# Patient Record
Sex: Female | Born: 1955
Health system: Southern US, Community
[De-identification: ages and names within clinical notes are randomized; demographics above are authoritative.]

## PROBLEM LIST (undated history)

## (undated) DIAGNOSIS — F329 Major depressive disorder, single episode, unspecified: Secondary | ICD-10-CM

## (undated) DIAGNOSIS — J449 Chronic obstructive pulmonary disease, unspecified: Secondary | ICD-10-CM

## (undated) DIAGNOSIS — Z8719 Personal history of other diseases of the digestive system: Secondary | ICD-10-CM

## (undated) DIAGNOSIS — F319 Bipolar disorder, unspecified: Secondary | ICD-10-CM

## (undated) DIAGNOSIS — M1990 Unspecified osteoarthritis, unspecified site: Secondary | ICD-10-CM

## (undated) DIAGNOSIS — K219 Gastro-esophageal reflux disease without esophagitis: Secondary | ICD-10-CM

## (undated) DIAGNOSIS — Z87442 Personal history of urinary calculi: Secondary | ICD-10-CM

## (undated) DIAGNOSIS — S72009A Fracture of unspecified part of neck of unspecified femur, initial encounter for closed fracture: Secondary | ICD-10-CM

## (undated) DIAGNOSIS — R51 Headache: Secondary | ICD-10-CM

## (undated) DIAGNOSIS — F419 Anxiety disorder, unspecified: Secondary | ICD-10-CM

## (undated) DIAGNOSIS — F32A Depression, unspecified: Secondary | ICD-10-CM

## (undated) DIAGNOSIS — M542 Cervicalgia: Secondary | ICD-10-CM

## (undated) DIAGNOSIS — R748 Abnormal levels of other serum enzymes: Secondary | ICD-10-CM

## (undated) DIAGNOSIS — R251 Tremor, unspecified: Secondary | ICD-10-CM

## (undated) DIAGNOSIS — G35 Multiple sclerosis: Secondary | ICD-10-CM

## (undated) DIAGNOSIS — R519 Headache, unspecified: Secondary | ICD-10-CM

## (undated) DIAGNOSIS — N189 Chronic kidney disease, unspecified: Secondary | ICD-10-CM

## (undated) DIAGNOSIS — I10 Essential (primary) hypertension: Secondary | ICD-10-CM

## (undated) DIAGNOSIS — J342 Deviated nasal septum: Secondary | ICD-10-CM

## (undated) HISTORY — PX: TUBAL LIGATION: SHX77

## (undated) HISTORY — DX: Fracture of unspecified part of neck of unspecified femur, initial encounter for closed fracture: S72.009A

## (undated) HISTORY — PX: EYE SURGERY: SHX253

## (undated) HISTORY — PX: LIPOMA EXCISION: SHX5283

## (undated) HISTORY — DX: Abnormal levels of other serum enzymes: R74.8

---

## 1997-11-28 ENCOUNTER — Ambulatory Visit (HOSPITAL_COMMUNITY): Admission: RE | Admit: 1997-11-28 | Discharge: 1997-11-28 | Payer: Self-pay | Admitting: *Deleted

## 1997-12-02 ENCOUNTER — Ambulatory Visit (HOSPITAL_COMMUNITY): Admission: RE | Admit: 1997-12-02 | Discharge: 1997-12-02 | Payer: Self-pay | Admitting: *Deleted

## 1998-06-12 ENCOUNTER — Ambulatory Visit (HOSPITAL_COMMUNITY): Admission: RE | Admit: 1998-06-12 | Discharge: 1998-06-12 | Payer: Self-pay | Admitting: *Deleted

## 1998-06-12 ENCOUNTER — Encounter: Payer: Self-pay | Admitting: *Deleted

## 1998-09-10 ENCOUNTER — Emergency Department (HOSPITAL_COMMUNITY): Admission: EM | Admit: 1998-09-10 | Discharge: 1998-09-10 | Payer: Self-pay | Admitting: Emergency Medicine

## 1998-09-10 ENCOUNTER — Encounter: Payer: Self-pay | Admitting: Emergency Medicine

## 1998-09-25 ENCOUNTER — Other Ambulatory Visit: Admission: RE | Admit: 1998-09-25 | Discharge: 1998-09-25 | Payer: Self-pay | Admitting: *Deleted

## 1998-12-14 ENCOUNTER — Ambulatory Visit (HOSPITAL_COMMUNITY): Admission: RE | Admit: 1998-12-14 | Discharge: 1998-12-14 | Payer: Self-pay | Admitting: *Deleted

## 1999-11-04 ENCOUNTER — Other Ambulatory Visit: Admission: RE | Admit: 1999-11-04 | Discharge: 1999-11-04 | Payer: Self-pay | Admitting: *Deleted

## 2000-11-07 ENCOUNTER — Encounter: Payer: Self-pay | Admitting: *Deleted

## 2000-11-07 ENCOUNTER — Ambulatory Visit (HOSPITAL_COMMUNITY): Admission: RE | Admit: 2000-11-07 | Discharge: 2000-11-07 | Payer: Self-pay | Admitting: *Deleted

## 2001-05-28 ENCOUNTER — Encounter: Admission: RE | Admit: 2001-05-28 | Discharge: 2001-07-05 | Payer: Self-pay | Admitting: Psychiatry

## 2002-04-29 ENCOUNTER — Other Ambulatory Visit: Admission: RE | Admit: 2002-04-29 | Discharge: 2002-04-29 | Payer: Self-pay | Admitting: *Deleted

## 2004-03-10 ENCOUNTER — Other Ambulatory Visit: Admission: RE | Admit: 2004-03-10 | Discharge: 2004-03-10 | Payer: Self-pay | Admitting: Family Medicine

## 2004-06-23 ENCOUNTER — Encounter: Admission: RE | Admit: 2004-06-23 | Discharge: 2004-06-23 | Payer: Self-pay | Admitting: Family Medicine

## 2004-11-10 ENCOUNTER — Encounter: Admission: RE | Admit: 2004-11-10 | Discharge: 2004-11-10 | Payer: Self-pay | Admitting: Otolaryngology

## 2005-02-23 ENCOUNTER — Emergency Department (HOSPITAL_COMMUNITY): Admission: EM | Admit: 2005-02-23 | Discharge: 2005-02-23 | Payer: Self-pay | Admitting: Emergency Medicine

## 2005-03-21 ENCOUNTER — Other Ambulatory Visit: Admission: RE | Admit: 2005-03-21 | Discharge: 2005-03-21 | Payer: Self-pay | Admitting: Obstetrics and Gynecology

## 2005-10-05 ENCOUNTER — Ambulatory Visit: Payer: Self-pay | Admitting: Gastroenterology

## 2005-10-10 ENCOUNTER — Ambulatory Visit: Payer: Self-pay | Admitting: Gastroenterology

## 2005-10-31 ENCOUNTER — Ambulatory Visit: Payer: Self-pay | Admitting: Gastroenterology

## 2006-06-27 HISTORY — PX: COLONOSCOPY: SHX174

## 2006-07-11 ENCOUNTER — Ambulatory Visit (HOSPITAL_BASED_OUTPATIENT_CLINIC_OR_DEPARTMENT_OTHER): Admission: RE | Admit: 2006-07-11 | Discharge: 2006-07-11 | Payer: Self-pay | Admitting: Urology

## 2006-07-24 ENCOUNTER — Ambulatory Visit: Payer: Self-pay | Admitting: Gastroenterology

## 2006-07-24 LAB — CONVERTED CEMR LAB
ALT: 16 units/L (ref 0–40)
Albumin: 3.2 g/dL — ABNORMAL LOW (ref 3.5–5.2)
BUN: 10 mg/dL (ref 6–23)
Basophils Absolute: 0 10*3/uL (ref 0.0–0.1)
Chloride: 106 meq/L (ref 96–112)
Creatinine, Ser: 0.9 mg/dL (ref 0.4–1.2)
HCT: 35.5 % — ABNORMAL LOW (ref 36.0–46.0)
Hemoglobin: 12.4 g/dL (ref 12.0–15.0)
MCHC: 35 g/dL (ref 30.0–36.0)
MCV: 89.1 fL (ref 78.0–100.0)
Neutrophils Relative %: 64 % (ref 43.0–77.0)
Platelets: 170 10*3/uL (ref 150–400)
Potassium: 4 meq/L (ref 3.5–5.1)
Total Protein: 6.1 g/dL (ref 6.0–8.3)
WBC: 4.1 10*3/uL — ABNORMAL LOW (ref 4.5–10.5)

## 2006-08-04 ENCOUNTER — Encounter (INDEPENDENT_AMBULATORY_CARE_PROVIDER_SITE_OTHER): Payer: Self-pay | Admitting: Specialist

## 2006-08-04 ENCOUNTER — Ambulatory Visit (HOSPITAL_COMMUNITY): Admission: RE | Admit: 2006-08-04 | Discharge: 2006-08-04 | Payer: Self-pay | Admitting: Obstetrics & Gynecology

## 2006-08-08 ENCOUNTER — Ambulatory Visit: Payer: Self-pay | Admitting: Gastroenterology

## 2006-08-29 ENCOUNTER — Ambulatory Visit: Payer: Self-pay | Admitting: Gastroenterology

## 2006-08-30 ENCOUNTER — Ambulatory Visit (HOSPITAL_COMMUNITY): Admission: RE | Admit: 2006-08-30 | Discharge: 2006-08-30 | Payer: Self-pay | Admitting: Gastroenterology

## 2006-09-20 ENCOUNTER — Ambulatory Visit: Payer: Self-pay | Admitting: Gastroenterology

## 2006-10-09 ENCOUNTER — Ambulatory Visit: Payer: Self-pay | Admitting: Gastroenterology

## 2006-10-16 ENCOUNTER — Ambulatory Visit: Payer: Self-pay | Admitting: Gastroenterology

## 2006-10-20 ENCOUNTER — Encounter (INDEPENDENT_AMBULATORY_CARE_PROVIDER_SITE_OTHER): Payer: Self-pay | Admitting: *Deleted

## 2006-10-20 ENCOUNTER — Ambulatory Visit: Payer: Self-pay | Admitting: Gastroenterology

## 2006-11-14 ENCOUNTER — Ambulatory Visit: Payer: Self-pay | Admitting: Gastroenterology

## 2007-10-01 ENCOUNTER — Ambulatory Visit (HOSPITAL_COMMUNITY): Admission: RE | Admit: 2007-10-01 | Discharge: 2007-10-01 | Payer: Self-pay | Admitting: Family Medicine

## 2009-08-29 ENCOUNTER — Ambulatory Visit (HOSPITAL_COMMUNITY): Admission: RE | Admit: 2009-08-29 | Discharge: 2009-08-29 | Payer: Self-pay | Admitting: Family Medicine

## 2010-03-29 ENCOUNTER — Ambulatory Visit (HOSPITAL_COMMUNITY): Admission: RE | Admit: 2010-03-29 | Discharge: 2010-03-29 | Payer: Self-pay | Admitting: Psychiatry

## 2010-09-17 ENCOUNTER — Other Ambulatory Visit: Payer: Self-pay | Admitting: Dermatology

## 2010-11-09 NOTE — Assessment & Plan Note (Signed)
[facility]                         GASTROENTEROLOGY OFFICE NOTE   NAME:NEWMANAudryanna, Zurita                     MRN:          [id number]  DATE:11/14/2006                            DOB:          April 14, 1956    PROBLEM:  Diarrhea.   Ms. Fellows has returned for a scheduled GI followup.  She reports  improvement in her diarrhea, taking Imodium one tablet daily.  When she  took two tablets at a time, she developed abdominal bloating.  Colonoscopy on October 20, 2006 was entirely normal.  Random biopsies were  negative for microscopic colitis.  She claims her energy level has  improved.  She has occasional nausea.   PHYSICAL EXAMINATION:  VITAL SIGNS:  Pulse 62, blood pressure 110/70.  eight 140.   IMPRESSION:  Diarrhea.  I suspect this may be related to her multiple  sclerosis in view of the correlation between her diarrhea and worsening  of her multiple sclerosis.  It seems to be under good control with very  low-dose Imodium.   RECOMMENDATIONS:  Continue Imodium.  I recommended that she try taking  one tablet twice a day rather than two tablets at a time.     Barbette Hair. Arlyce Dice, MD,FACG  Electronically Signed    RDK/MedQ  DD: 11/14/2006  DT: 11/14/2006  Job #: [id number]   cc:   Alfredia Client, MD  Harriette Bouillon

## 2010-11-12 NOTE — Assessment & Plan Note (Signed)
[facility]                         GASTROENTEROLOGY OFFICE NOTE   NAME:NEWMANJaelene, Garciagarcia                     MRN:          [id number]  DATE:08/29/2006                            DOB:          11-May-1956    PROBLEM:  Diarrhea and abdominal pain.   Ms. Fiscus has returned for reevaluation.  Diarrhea continues unabated.  Sigmoidoscopy suggested some edema in the left colon but biopsies were  negative.  Stool studies were also unremarkable.  She was started on  Lialda but has not improved.  She also complains of moderately severe  upper epigastric pain that may radiate to her left chest and shoulder.  The pain may be separate from her chest discomfort.  She complains of  spontaneous left chest and shoulder pain that radiates to her neck that  also is present with exercise.  She claims to have lost weight and  altogether feels poorly.  She has been under a great deal of stress in  the last few weeks due to problems with her marriage.   PHYSICAL EXAMINATION:  VITAL SIGNS:  Pulse 72, blood pressure 130/90,  weight 138.  ABDOMEN:  She has mild midepigastric tenderness without guarding or  rebound.  There are no abdominal masses or organomegaly.   IMPRESSION:  Persistent diarrhea with new onset of severe upper  abdominal pain and chest pain.  This could be due to ulcer or non-ulcer  dyspepsia.  Her gastrointestinal symptoms may also be related to stress  and anxiety.  Chest pain may also be related to this, though a cardiac  etiology ought to be ruled out (family history is pertinent for mother  who developed heart problems in her late 45s).   RECOMMENDATIONS:  1. Upper endoscopy.  2. ECG.  3. If the two are not diagnostic, I will place her on antispasmodics      and consider empiric therapy with Xifaxan and follow this with a      cardiac evaluation.     Barbette Hair. Arlyce Dice, MD,FACG  Electronically Signed    RDK/MedQ  DD: 08/29/2006  DT: 08/29/2006   Job #: [id number]   cc:   Alfredia Client, MD  Harriette Bouillon

## 2010-11-12 NOTE — Assessment & Plan Note (Signed)
[facility]                         GASTROENTEROLOGY OFFICE NOTE   NEVAEHA, FINERTY                     MRN:          [id number]  DATE:10/09/2006                            DOB:          16-May-1956    PROBLEM:  Ms. Anfinson has returned for ongoing evaluation.  She continues  to have urgency and diarrhea. She may have urgency without a bowel  movement or pass minimal amounts of stool.  She is no better with  Flagyl.  She has taken Lialda, Flagyl, Levbid without improvement in her  diarrhea.  She noted abrupt onset of diarrhea in November which  initially was accompanied by nausea and vomiting. Prior to that she was  having solid bowel movements.  Stool studies have all been negative.  Her medicines have not changed.  Her worsening symptoms are coincident  with worsening of her multiple sclerosis.   On exam, pulse 68, blood pressure 122/80, weight 136.   IMPRESSION:  Persistent diarrhea with urgency.  Occult enteric infection  is less likely though still of consideration given her rather abrupt  onset.  Underlying inflammatory bowel disease, be it microscopic colitis  or gross colitis, remains a possibility as well.  Finally, her symptoms  are concurrent with the worsening of her multiple sclerosis, which at  least raises the question whether she could have motility disorder  related to her multiple sclerosis.   RECOMMENDATIONS:  Empiric trial of Cipro 500 mg twice a day.  If she is  not improved, then I will repeat her colonoscopy and we will obtain  random biopsies to rule out microscopic colitis, and if negative, I  would consider a small bowel study.     Barbette Hair. Arlyce Dice, MD,FACG  Electronically Signed    RDK/MedQ  DD: 10/09/2006  DT: 10/09/2006  Job #: [id number]   cc:   Alfredia Client, MD  Trudie Buckler

## 2010-11-12 NOTE — Assessment & Plan Note (Signed)
[facility]                         GASTROENTEROLOGY OFFICE NOTE   NAME:NEWMANGuyla, Bless                     MRN:          [id number]  DATE:07/24/2006                            DOB:          [birth year]    PROBLEM:  Diarrhea.   Ms. Kyer has returned complaining of diarrhea.   Since November she has noted a change in bowel habits consisting of  frequent bowel movements throughout the day.  She may have up to 10  bowel movements daily.  It is often post prandially, but also occurs in  the absence of eating.  She will awaken at night to defecate.  She  apparently took antibiotics, at least during the summer.  There has been  no other change in medicines.  Colonoscopy in April 2007 was  unremarkable.  There is no history of melena or hematochezia.  Abdominal  bloating has been well controlled with Gas-X.   MEDICATIONS:  Neurontin, baclofen, clonazepam, Prempro, Lamictal, and  Depakote.   EXAMINATION:  Pulse 65, blood pressure 110/70, weight 140.  ABDOMEN:  Without mass or tenderness or organomegaly.   IMPRESSION:  New onset of diarrhea which has become chronic.  Pseudomembranous colitis ought to be ruled out.  It is unlikely that  symptoms are due to her medicines, in as much as she has not had any  recent changes.  Enteric infection is another consideration.  Symptoms  appear rather severe for irritable bowel syndrome.   RECOMMENDATIONS:  1. Check stools for C. Diff, toxin, CNS, and leukocytes, and O&P.  2. Check CBC and CMET.  3. To consider sigmoidoscopy if stool studies are not diagnostic and      blood work is normal.     Barbette Hair. Arlyce Dice, MD,FACG  Electronically Signed    RDK/MedQ  DD: 07/24/2006  DT: 07/24/2006  Job #: [id number]   cc:   Alfredia Client, MD  Trudie Buckler

## 2010-11-12 NOTE — Op Note (Signed)
NAMESHANAIYA, BENE              ACCOUNT NO.:  [id number]   MEDICAL RECORD NO.:  [id number]          PATIENT TYPE:  AMB   LOCATION:  [facility]                         FACILITY:  [facility]   PHYSICIAN:  Martina Sinner, MD DATE OF BIRTH:  03/27/1956   DATE OF PROCEDURE:  DATE OF DISCHARGE:                               OPERATIVE REPORT   PREOPERATIVE DIAGNOSIS:  Pelvic pain.   POSTOPERATIVE DIAGNOSIS:  Pelvic pain; interstitial cystitis.   SURGERY:  Cystoscopy, bladder hydrodistention and bladder installation  therapy.   Ms. Radabaugh has chronic pelvic pain and also a lot of discomfort at night  relieved by voiding. She has mixed stress and urge incontinence.  She  has been thoroughly evaluated with urodynamics.   The patient was prepped and draped in the usual fashion.  On initial  inspection of the bladder, the bladder mucosa was normal.  There is no  stitch, foreign body or carcinoma.  I instilled and hydrodistended to  approximately 600 mL.  On re-examination of the bladder, she had diffuse  glomerulations but no ulcers. The bladder was emptied.  A small red  rubber catheter was inserted and I instilled 15 mL of 0.5% Marcaine in  combination with 400 mg Pyridium.   The patient was sent to the recovery room.  She was given preoperative  ciprofloxacin.   The patient will be treated as having interstitial cystitis.           ______________________________  Martina Sinner, MD  Electronically Signed     SAM/MEDQ  D:  07/11/2006  T:  07/11/2006  Job:  [id number]

## 2010-11-12 NOTE — Assessment & Plan Note (Signed)
[facility]                         GASTROENTEROLOGY OFFICE NOTE   Angel, Maddox                     MRN:          [id number]  DATE:09/20/2006                            DOB:          01-28-1956    [name] has returned for reevaluation. On a regimen of Levbid and  Xifaxan, there has been some improvement in her diarrhea. She still has  urgency and with urgency stools may be loose. Sigmoidoscopy raised a  question of erythema though biopsies were negative. She is still not  back to her baseline. She does complain of chest pain for which she  underwent upper endoscopy which was normal. She claims pain may be worse  when she swallows a large pill and begins in her lower chest or upper  abdomen and radiates to her mid chest. She also has chest pain at rest  under the left breast that radiates to her left neck and left arm.   FAMILY HISTORY:  Notable for mother who had a heart attack in her late  28s.   PHYSICAL EXAMINATION:  VITAL SIGNS:  Pulse 64, blood pressure 120/78,  weight 138.   IMPRESSION:  1. Diarrhea. This may be irritable bowel syndrome though with this      rather abrupt onset, infectious etiology remains a consideration      despite her negative stool studies including Clostridium difficile,      CNS, and ova and parasites.  2. Probable gastroesophageal reflux disease.  3. Chest pain. This may be related to her gastroesophageal reflux      disease. Given her family history of cardiac disease, I believe      that she ought to be reevaluated by cardiology.   RECOMMENDATIONS:  1. Empiric therapy with Flagyl 500 mg twice a day for 10 days and      AcipHex 20 mg a day.  2. Cardiac evaluation. I have asked her to see a cardiologist for      evaluation.   I have asked [name] to return in approximately two weeks.     Angel Maddox. Arlyce Dice, MD,FACG  Electronically Signed    RDK/MedQ  DD: 09/20/2006  DT: 09/20/2006  Job #:  [id number]   cc:   Alfredia Client, MD  Harriette Bouillon

## 2011-11-07 ENCOUNTER — Other Ambulatory Visit: Payer: Self-pay | Admitting: Dermatology

## 2012-01-19 ENCOUNTER — Other Ambulatory Visit: Payer: Self-pay | Admitting: Obstetrics and Gynecology

## 2012-05-09 ENCOUNTER — Other Ambulatory Visit: Payer: Self-pay | Admitting: Urology

## 2012-05-14 ENCOUNTER — Encounter (HOSPITAL_COMMUNITY): Payer: Self-pay | Admitting: Pharmacy Technician

## 2012-05-14 ENCOUNTER — Encounter (HOSPITAL_COMMUNITY): Payer: Self-pay | Admitting: *Deleted

## 2012-05-14 NOTE — Pre-Procedure Instructions (Addendum)
Asked to bring blue folder the day of the procedure,insurance card,I.D. driver's license,wear comfortable clothing and have a driver for the day. Asked not to take Advil,Motrin,Ibuprofen,Aleve or any NSAIDS, Aspirin, or Toradol for 72 hours prior to procedure,  No vitamins or herbal medications 7 days prior to procedure. Will stop Aspirin per policy. Instructed to take laxative per doctor's office instructions and eat a light dinner the evening before procedure.   To arrive at 0530  for lithotripsy procedure.

## 2012-05-17 NOTE — H&P (Signed)
History of Present Illness         Nephrolithiasis:  She underwent a CT scan in 2006 which revealed left renal calculi. A CT scan done on 04/23/12 for microscopic hematuria evaluation revealed a 6 mm stone located at the ureteropelvic junction region without obstruction. He had Hounsfield units of ~ 700. The radiologist felt there was some thickening of the mucosa and possible enhancement which would be consistent with irritation/inflammation from the stone. Her stone is easily visible on reformatted "KUB" portion of her CT scan.  Interval history: She had a stone several years ago that passed spontaneously. It was associated with some gross hematuria. She then had gross hematuria on 2 separate occasions that lasted about a week at a time. She been on antibiotics. Her hematuria has cleared. She's not having any flank pain at this time.   Past Medical History Problems  1. History of  Depression With Anxiety 300.4 2. Former Smoker V15.82 3. History of  Hypertension 401.9 4. History of  Irritable Bowel Syndrome 564.1 5. History of  Multiple Sclerosis 340 6. History of  Nephrolithiasis V13.01  Surgical History Problems  1. History of  Laparoscopy (Diagnostic) 2. History of  Tubal Ligation V25.2  Current Meds 1. Carisoprodol 350 MG Oral Tablet; Therapy: (Recorded:15Nov2007) to 2. Citalopram Hydrobromide 40 MG Oral Tablet; Therapy: 10Dec2012 to 3. ClonazePAM 0.5 MG Oral Tablet; Therapy: (Recorded:15Nov2007) to 4. Depakote 500 MG Oral Tablet Delayed Release; Therapy: (Recorded:15Nov2007) to 5. LamoTRIgine 200 MG Oral Tablet; Therapy: 18Sep2012 to 6. Neurontin 800 MG Oral Tablet; TAKE 1 TABLET 3 TIMES DAILY; Therapy: (Recorded:15Nov2007)  to 7. Nitrofurantoin Macrocrystal 100 MG Oral Capsule; TAKE 1 MG Twice daily; Therapy: 21Oct2013 to  (Last Rx:21Oct2013)  Requested for: 21Oct2013 8. Oxybutynin Chloride 5 MG Oral Tablet; Therapy: (Recorded:15Nov2007) to 9. Oxybutynin Chloride ER 10 MG Oral  Tablet Extended Release 24 Hour; TAKE 1 TABLET DAILY;  Therapy: 28Oct2013 to (Evaluate:23Oct2014)  Requested for: 28Oct2013; Last Rx:28Oct2013 10. Prempro 0.3-1.5 MG Oral Tablet; Therapy: (Recorded:24Apr2013) to 11. Primidone 50 MG Oral Tablet; Therapy: 25Oct2012 to 12. Rebif SOLN; Therapy: (Recorded:15Nov2007) to 13. RisperiDONE 1 MG Oral Tablet; Therapy: 26Jun2012 to 14. Tecfidera 240 MG Oral Capsule Delayed Release; Therapy: 12Jun2013 to 15. Zolpidem Tartrate 10 MG Oral Tablet; Therapy: (Recorded:24Apr2013) to  Allergies Medication  1. No Known Drug Allergies No Known Allergies  2. No Known Allergies  Family History Problems  1. Family history of  Cancer 2. Family history of  Heart Disease 3. Family history of  Hypertension 4. Family history of  Nephrolithiasis  Social History Problems  1. Alcohol Use only very occasional; may be every 4 or 5 months 2. Caffeine Use 3. Family history of  Death In The Family Father 64, alcohol 4. Family history of  Death In The Family Mother 81, heart 5. Marital History - Currently Married 6. Tobacco Use V15.82 Quit 1980; smoked may be 2 cigarettes per day for 13 years 7. Unemployed  Review of Systems: Pertinent items are noted in HPI. A comprehensive review of systems was negative except as above  Physical Exam: General appearance: alert and appears stated age Head: Normocephalic, without obvious abnormality, atraumatic Eyes: conjunctivae/corneas clear. EOM's intact.  Oropharynx: moist mucous membranes Neck: supple, symmetrical, trachea midline Resp: normal respiratory effort Cardio: regular rate and rhythm Back: symmetric, no curvature. ROM normal. No CVA tenderness. GI: soft, non-tender; bowel sounds normal; no masses,  no organomegaly Pelvic: deferred Extremities: extremities normal, atraumatic, no cyanosis or edema Skin: Skin color normal.  Assessment Assessed  1. Probable  Simple Renal Cyst In Both Kidneys 593.2 2.  Adrenal Cortical Adenoma Right 227.0 3. Proximal Ureteral Stone On The Left 592.1      We discussed the management of urinary stones. These options include observation, ureteroscopy, shockwave lithotripsy, and PCNL. We discussed which options are relevant to these particular stones. We discussed the natural history of stones as well as the complications of untreated stones and the impact on quality of life without treatment as well as with each of the above listed treatments. We also discussed the efficacy of each treatment in its ability to clear the stone burden. With any of these management options I discussed the signs and symptoms of infection and the need for emergent treatment should these be experienced. For each option we discussed the ability of each procedure to clear the patient of their stone burden.  For observation I described the risks which include but are not limited to silent renal damage, life-threatening infection, need for emergent surgery, failure to pass stone, and pain.  For ureteroscopy I described the risks which include heart attack, stroke, pulmonary embolus, death, bleeding, infection, damage to contiguous structures, positioning injury, ureteral stricture, ureteral avulsion, ureteral injury, need for ureteral stent, inability to perform ureteroscopy, need for an interval procedure, inability to clear stone burden, stent discomfort and pain.  For shockwave lithotripsy I described the risks which include arrhythmia, kidney contusion, kidney hemorrhage, need for transfusion, long-term risk of diabetes or hypertension, back discomfort, flank ecchymosis, flank abrasion, inability to break up stone, inability to pass stone fragments, Steinstrasse, infection associated with obstructing stones, need for different surgical procedure, need for repeat shockwave lithotripsy, and death.  After discussing each of these treatment options she has elected to proceed with lithotripsy of her  stone. I told her that once the stone was completely cleared I would repeat an imaging study of her upper tract to be sure that the inflammation/thickening near the stone was inflammatory only.   Plan    1. She will be scheduled for lithotripsy of her left proximal ureteral stone. 2. Once her stone has been shown to have been completely fragmented and passed she will need a repeat CT scan of the kidneys with contrast which can be performed about 6 weeks after her followup and clearance of stone.

## 2012-05-20 MED ORDER — CIPROFLOXACIN HCL 500 MG PO TABS
500.0000 mg | ORAL_TABLET | ORAL | Status: DC
  Filled 2012-05-20: qty 1

## 2012-05-21 ENCOUNTER — Encounter (HOSPITAL_COMMUNITY): Payer: Self-pay | Admitting: *Deleted

## 2012-05-21 ENCOUNTER — Ambulatory Visit (HOSPITAL_COMMUNITY)
Admission: RE | Admit: 2012-05-21 | Discharge: 2012-05-21 | Disposition: A | Discharge status: Home / Self Care | Payer: Medicare Other | Source: Ambulatory Visit | Attending: Urology | Admitting: Urology

## 2012-05-21 ENCOUNTER — Ambulatory Visit (HOSPITAL_COMMUNITY): Payer: Medicare Other

## 2012-05-21 ENCOUNTER — Encounter (HOSPITAL_COMMUNITY)
Admission: RE | Disposition: A | Discharge status: Home / Self Care | Payer: Self-pay | Source: Ambulatory Visit | Attending: Urology

## 2012-05-21 DIAGNOSIS — N281 Cyst of kidney, acquired: Secondary | ICD-10-CM | POA: Insufficient documentation

## 2012-05-21 DIAGNOSIS — G35 Multiple sclerosis: Secondary | ICD-10-CM | POA: Insufficient documentation

## 2012-05-21 DIAGNOSIS — N200 Calculus of kidney: Secondary | ICD-10-CM

## 2012-05-21 DIAGNOSIS — I10 Essential (primary) hypertension: Secondary | ICD-10-CM | POA: Insufficient documentation

## 2012-05-21 DIAGNOSIS — D3500 Benign neoplasm of unspecified adrenal gland: Secondary | ICD-10-CM | POA: Insufficient documentation

## 2012-05-21 DIAGNOSIS — N201 Calculus of ureter: Secondary | ICD-10-CM | POA: Insufficient documentation

## 2012-05-21 DIAGNOSIS — Z79899 Other long term (current) drug therapy: Secondary | ICD-10-CM | POA: Insufficient documentation

## 2012-05-21 HISTORY — DX: Multiple sclerosis: G35

## 2012-05-21 HISTORY — DX: Tremor, unspecified: R25.1

## 2012-05-21 HISTORY — DX: Anxiety disorder, unspecified: F41.9

## 2012-05-21 HISTORY — DX: Personal history of other diseases of the digestive system: Z87.19

## 2012-05-21 HISTORY — DX: Depression, unspecified: F32.A

## 2012-05-21 HISTORY — DX: Major depressive disorder, single episode, unspecified: F32.9

## 2012-05-21 HISTORY — DX: Bipolar disorder, unspecified: F31.9

## 2012-05-21 HISTORY — DX: Chronic kidney disease, unspecified: N18.9

## 2012-05-21 SURGERY — LITHOTRIPSY, ESWL
Anesthesia: LOCAL | Laterality: Left

## 2012-05-21 MED ORDER — TAMSULOSIN HCL 0.4 MG PO CAPS: 0.4000 mg | ORAL_CAPSULE | ORAL | Status: DC

## 2012-05-21 MED ORDER — DIAZEPAM 5 MG PO TABS
10.0000 mg | ORAL_TABLET | ORAL | Status: AC
  Administered 2012-05-21: 10 mg via ORAL
  Filled 2012-05-21: qty 2

## 2012-05-21 MED ORDER — CIPROFLOXACIN HCL 500 MG PO TABS
500.0000 mg | ORAL_TABLET | ORAL | Status: AC
  Administered 2012-05-21: 500 mg via ORAL
  Filled 2012-05-21: qty 1

## 2012-05-21 MED ORDER — SODIUM CHLORIDE 0.9 % IV SOLN
INTRAVENOUS | Status: DC
  Administered 2012-05-21: 07:00:00 via INTRAVENOUS

## 2012-05-21 MED ORDER — HYDROCODONE-ACETAMINOPHEN 10-325 MG PO TABS: 1.0000 | ORAL_TABLET | Freq: Four times a day (QID) | ORAL | Status: DC | PRN

## 2012-05-21 MED ORDER — DIPHENHYDRAMINE HCL 25 MG PO CAPS
25.0000 mg | ORAL_CAPSULE | ORAL | Status: AC
  Administered 2012-05-21: 25 mg via ORAL
  Filled 2012-05-21: qty 1

## 2012-05-21 NOTE — Op Note (Signed)
See [facility] OP note scanned into chart. 

## 2012-05-21 NOTE — Interval H&P Note (Signed)
History and Physical Interval Note:  05/21/2012 7:43 AM  Angel Maddox  has presented today for surgery, with the diagnosis of Left Ureteral Stone  The various methods of treatment have been discussed with the patient and family. After consideration of risks, benefits and other options for treatment, the patient has consented to  Procedure(s) (LRB) with comments: EXTRACORPOREAL SHOCK WAVE LITHOTRIPSY (ESWL) (Left) as a surgical intervention .  The patient's history has been reviewed, patient examined, no change in status, stable for surgery.  I have reviewed the patient's chart and labs.  Questions were answered to the patient's satisfaction.     [place] Farm

## 2012-05-21 NOTE — Progress Notes (Signed)
Post ESWL recover . Right flank grapefruit sized pink area with small abrasion in center tegaderm appled

## 2013-07-08 DIAGNOSIS — H5210 Myopia, unspecified eye: Secondary | ICD-10-CM | POA: Diagnosis not present

## 2013-07-08 DIAGNOSIS — H04129 Dry eye syndrome of unspecified lacrimal gland: Secondary | ICD-10-CM | POA: Diagnosis not present

## 2013-07-08 DIAGNOSIS — H52209 Unspecified astigmatism, unspecified eye: Secondary | ICD-10-CM | POA: Diagnosis not present

## 2013-07-08 DIAGNOSIS — H524 Presbyopia: Secondary | ICD-10-CM | POA: Diagnosis not present

## 2013-10-23 DIAGNOSIS — F3131 Bipolar disorder, current episode depressed, mild: Secondary | ICD-10-CM | POA: Diagnosis not present

## 2013-11-04 DIAGNOSIS — D7289 Other specified disorders of white blood cells: Secondary | ICD-10-CM | POA: Diagnosis not present

## 2013-11-04 DIAGNOSIS — G35 Multiple sclerosis: Secondary | ICD-10-CM | POA: Diagnosis not present

## 2014-02-15 ENCOUNTER — Encounter: Payer: Self-pay | Admitting: Gastroenterology

## 2014-04-11 ENCOUNTER — Ambulatory Visit: Payer: Self-pay | Admitting: Physician Assistant

## 2014-04-11 ENCOUNTER — Encounter (INDEPENDENT_AMBULATORY_CARE_PROVIDER_SITE_OTHER): Payer: Self-pay

## 2014-04-11 ENCOUNTER — Ambulatory Visit (INDEPENDENT_AMBULATORY_CARE_PROVIDER_SITE_OTHER): Payer: Medicare Other | Admitting: Family Medicine

## 2014-04-11 VITALS — BP 160/86 | HR 67 | Temp 97.4°F | Ht 66.5 in | Wt 138.4 lb

## 2014-04-11 DIAGNOSIS — H109 Unspecified conjunctivitis: Secondary | ICD-10-CM | POA: Diagnosis not present

## 2014-04-11 MED ORDER — POLYMYXIN B-TRIMETHOPRIM 10000-0.1 UNIT/ML-% OP SOLN: 2.0000 [drp] | OPHTHALMIC | Status: DC

## 2014-04-11 NOTE — Progress Notes (Signed)
   Subjective:    Patient ID: Angel Maddox, female    DOB: 26-Jun-1956, 58 y.o.   MRN: [id number]  HPI This 58 y.o. female presents for evaluation of red eye left eye and drainage for 2 days.   Review of Systems    No chest pain, SOB, HA, dizziness, vision change, N/V, diarrhea, constipation, dysuria, urinary urgency or frequency, myalgias, arthralgias or rash.  Objective:   Physical Exam Vital signs noted  Well developed well nourished female.  HEENT - Head atraumatic Normocephalic                Eyes - PERRLA, Conjuctiva - Injected left Sclera- Clear EOMI                Ears - EAC's Wnl TM's Wnl Gross Hearing WNL                Nose - Nares patent                 Throat - oropharanx wnl Respiratory - Lungs CTA bilateral Cardiac - RRR S1 and S2 without murmur GI - Abdomen soft Nontender and bowel sounds active x 4 Extremities - No edema. Neuro - Grossly intact.        Assessment & Plan:  Conjunctivitis of left eye - Plan: trimethoprim-polymyxin b (POLYTRIM) ophthalmic solution 2 gtt's OS q 2 hours w/a and then q 4 hours   Lysbeth Penner FNP

## 2014-05-12 DIAGNOSIS — Z23 Encounter for immunization: Secondary | ICD-10-CM | POA: Diagnosis not present

## 2014-05-12 DIAGNOSIS — G35 Multiple sclerosis: Secondary | ICD-10-CM | POA: Diagnosis not present

## 2014-07-02 DIAGNOSIS — F3131 Bipolar disorder, current episode depressed, mild: Secondary | ICD-10-CM | POA: Diagnosis not present

## 2014-10-10 DIAGNOSIS — F3131 Bipolar disorder, current episode depressed, mild: Secondary | ICD-10-CM | POA: Diagnosis not present

## 2014-11-04 DIAGNOSIS — F3131 Bipolar disorder, current episode depressed, mild: Secondary | ICD-10-CM | POA: Diagnosis not present

## 2014-11-10 DIAGNOSIS — D72819 Decreased white blood cell count, unspecified: Secondary | ICD-10-CM | POA: Diagnosis not present

## 2014-11-10 DIAGNOSIS — G35 Multiple sclerosis: Secondary | ICD-10-CM | POA: Diagnosis not present

## 2014-11-11 DIAGNOSIS — L570 Actinic keratosis: Secondary | ICD-10-CM | POA: Diagnosis not present

## 2014-11-11 DIAGNOSIS — L821 Other seborrheic keratosis: Secondary | ICD-10-CM | POA: Diagnosis not present

## 2014-11-11 DIAGNOSIS — D239 Other benign neoplasm of skin, unspecified: Secondary | ICD-10-CM | POA: Diagnosis not present

## 2014-11-18 DIAGNOSIS — F3131 Bipolar disorder, current episode depressed, mild: Secondary | ICD-10-CM | POA: Diagnosis not present

## 2014-12-03 DIAGNOSIS — F3131 Bipolar disorder, current episode depressed, mild: Secondary | ICD-10-CM | POA: Diagnosis not present

## 2014-12-31 DIAGNOSIS — F3131 Bipolar disorder, current episode depressed, mild: Secondary | ICD-10-CM | POA: Diagnosis not present

## 2015-01-20 DIAGNOSIS — F3131 Bipolar disorder, current episode depressed, mild: Secondary | ICD-10-CM | POA: Diagnosis not present

## 2015-02-03 DIAGNOSIS — F3131 Bipolar disorder, current episode depressed, mild: Secondary | ICD-10-CM | POA: Diagnosis not present

## 2015-02-11 ENCOUNTER — Ambulatory Visit (INDEPENDENT_AMBULATORY_CARE_PROVIDER_SITE_OTHER): Payer: Medicare Other | Admitting: Family Medicine

## 2015-02-11 ENCOUNTER — Encounter: Payer: Self-pay | Admitting: Family Medicine

## 2015-02-11 VITALS — BP 140/100 | HR 71 | Temp 98.0°F | Ht 66.5 in | Wt 144.0 lb

## 2015-02-11 DIAGNOSIS — F319 Bipolar disorder, unspecified: Secondary | ICD-10-CM | POA: Insufficient documentation

## 2015-02-11 MED ORDER — RISPERIDONE 1 MG PO TABS: 1.0000 mg | ORAL_TABLET | Freq: Every evening | ORAL | Status: DC

## 2015-02-11 NOTE — Progress Notes (Signed)
Patient ID: Angel Maddox, female   DOB: [birth year], 59 y.o.   MRN: [id number]   HPI  Patient presents today depression evaluation  Patient explains that her 20 year old son died by overdose about 6 months ago. Since that time she's been very depressed and upset. She says over the last 2 weeks she's been more depressed and she has been previously.  On review of her mental health disorder she has bipolar 1 disorder and has been seen by Dr. Candis Schatz at [facility] in [place]. He however has started his own practice and she has not been able to get an appointment with him. She's been treated with Celexa and Klonopin for about 9 years. She's been on Risperdal and Lamictal for maybe 3-4 years. About 3 weeks ago she quit both of those medications.  She denies suicidal ideation today. She states that she is very depressed  PMH: Smoking status noted ROS: Per HPI  Objective: BP 140/100 mmHg  Pulse 71  Temp(Src) 98 F (36.7 C) (Oral)  Ht 5' 6.5" (1.689 m)  Wt 144 lb (65.318 kg)  BMI 22.90 kg/m2 Gen: NAD, alert, cooperative with exam HEENT: NCAT CV: RRR, good S1/S2, no murmur Resp: CTABL, no wheezes, non-labored Ext: No edema, warm Neuro: Alert and oriented, No gross deficits Psych: Tearful, depressed affect and mood, denies suicidal ideation  Assessment and plan:  Bipolar 1 disorder, depressed Hx of bipolar depression treated with Lamictal, Risperdal, Celexa, and Klonopin Previously seen psychiatry, I stressed that she needs a psychiatrist, refer to psychiatry She's recently stopped Lamictal and Risperdal, restart Risperdal. I think she has safely stopped Lamictal being 3 weeks out. Continue Celexa and Klonopin as previously scheduled, follow-up 2 weeks No suicidal ideation, denies manic episodes in the past    Orders Placed This Encounter  Procedures  . Ambulatory referral to Psychiatry    Referral Priority:  Routine    Referral Type:  Psychiatric   Referral Reason:  Specialty Services Required    Requested Specialty:  Psychiatry    Number of Visits Requested:  1    Meds ordered this encounter  Medications  . risperiDONE (RISPERDAL) 1 MG tablet    Sig: Take 1 tablet (1 mg total) by mouth every evening.    Dispense:  30 tablet    Refill:  0    Laroy Apple, MD [facility] 02/11/2015, 1:40 PM

## 2015-02-11 NOTE — Patient Instructions (Signed)
It was very good to meet you today, I am so sorry for your loss.  Please restart risperdal and come back in 2 weeks.   We will begin looking for a psychiatrist with you.

## 2015-02-11 NOTE — Assessment & Plan Note (Addendum)
Hx of bipolar depression treated with Lamictal, Risperdal, Celexa, and Klonopin Previously seen psychiatry, I stressed that she needs a psychiatrist, refer to psychiatry She's recently stopped Lamictal and Risperdal, restart Risperdal. I think she has safely stopped Lamictal being 3 weeks out. Continue Celexa and Klonopin as previously scheduled, follow-up 2 weeks No suicidal ideation, denies manic episodes in the past

## 2015-02-17 DIAGNOSIS — F3131 Bipolar disorder, current episode depressed, mild: Secondary | ICD-10-CM | POA: Diagnosis not present

## 2015-02-26 ENCOUNTER — Ambulatory Visit: Payer: Medicare Other | Admitting: Family Medicine

## 2015-03-18 DIAGNOSIS — F3131 Bipolar disorder, current episode depressed, mild: Secondary | ICD-10-CM | POA: Diagnosis not present

## 2015-03-20 DIAGNOSIS — F329 Major depressive disorder, single episode, unspecified: Secondary | ICD-10-CM | POA: Diagnosis not present

## 2015-03-20 DIAGNOSIS — F411 Generalized anxiety disorder: Secondary | ICD-10-CM | POA: Diagnosis not present

## 2015-04-15 DIAGNOSIS — F411 Generalized anxiety disorder: Secondary | ICD-10-CM | POA: Diagnosis not present

## 2015-04-29 DIAGNOSIS — F411 Generalized anxiety disorder: Secondary | ICD-10-CM | POA: Diagnosis not present

## 2015-05-07 DIAGNOSIS — F411 Generalized anxiety disorder: Secondary | ICD-10-CM | POA: Diagnosis not present

## 2015-05-11 DIAGNOSIS — F411 Generalized anxiety disorder: Secondary | ICD-10-CM | POA: Diagnosis not present

## 2015-05-25 DIAGNOSIS — F411 Generalized anxiety disorder: Secondary | ICD-10-CM | POA: Diagnosis not present

## 2015-06-04 DIAGNOSIS — F411 Generalized anxiety disorder: Secondary | ICD-10-CM | POA: Diagnosis not present

## 2015-06-09 DIAGNOSIS — F411 Generalized anxiety disorder: Secondary | ICD-10-CM | POA: Diagnosis not present

## 2015-06-25 DIAGNOSIS — F411 Generalized anxiety disorder: Secondary | ICD-10-CM | POA: Diagnosis not present

## 2015-06-28 DIAGNOSIS — S72009A Fracture of unspecified part of neck of unspecified femur, initial encounter for closed fracture: Secondary | ICD-10-CM

## 2015-06-28 HISTORY — DX: Fracture of unspecified part of neck of unspecified femur, initial encounter for closed fracture: S72.009A

## 2015-07-01 DIAGNOSIS — G35 Multiple sclerosis: Secondary | ICD-10-CM | POA: Diagnosis not present

## 2015-07-01 DIAGNOSIS — F339 Major depressive disorder, recurrent, unspecified: Secondary | ICD-10-CM | POA: Diagnosis not present

## 2015-07-01 DIAGNOSIS — D72819 Decreased white blood cell count, unspecified: Secondary | ICD-10-CM | POA: Diagnosis not present

## 2015-07-20 ENCOUNTER — Other Ambulatory Visit: Payer: Medicare Other

## 2015-07-20 DIAGNOSIS — G35 Multiple sclerosis: Secondary | ICD-10-CM | POA: Diagnosis not present

## 2015-07-21 LAB — CMP14+EGFR
A/G RATIO: 1.8 (ref 1.1–2.5)
ALT: 44 IU/L — AB (ref 0–32)
AST: 30 IU/L (ref 0–40)
Albumin: 4.2 g/dL (ref 3.5–5.5)
Alkaline Phosphatase: 107 IU/L (ref 39–117)
BUN/Creatinine Ratio: 13 (ref 9–23)
BUN: 11 mg/dL (ref 6–24)
Bilirubin Total: 0.2 mg/dL (ref 0.0–1.2)
CALCIUM: 9.1 mg/dL (ref 8.7–10.2)
CO2: 22 mmol/L (ref 18–29)
Chloride: 103 mmol/L (ref 96–106)
Creatinine, Ser: 0.86 mg/dL (ref 0.57–1.00)
GFR, EST AFRICAN AMERICAN: 86 mL/min/{1.73_m2} (ref >59)
GFR, EST NON AFRICAN AMERICAN: 74 mL/min/{1.73_m2} (ref >59)
GLUCOSE: 150 mg/dL — AB (ref 65–99)
Globulin, Total: 2.4 g/dL (ref 1.5–4.5)
Potassium: 3.4 mmol/L — ABNORMAL LOW (ref 3.5–5.2)
Sodium: 142 mmol/L (ref 134–144)
TOTAL PROTEIN: 6.6 g/dL (ref 6.0–8.5)

## 2015-08-03 DIAGNOSIS — F411 Generalized anxiety disorder: Secondary | ICD-10-CM | POA: Diagnosis not present

## 2015-08-13 ENCOUNTER — Telehealth: Payer: Self-pay | Admitting: Family Medicine

## 2015-08-18 DIAGNOSIS — F411 Generalized anxiety disorder: Secondary | ICD-10-CM | POA: Diagnosis not present

## 2015-08-27 DIAGNOSIS — F411 Generalized anxiety disorder: Secondary | ICD-10-CM | POA: Diagnosis not present

## 2015-08-28 ENCOUNTER — Encounter: Payer: Self-pay | Admitting: Family Medicine

## 2015-08-28 ENCOUNTER — Ambulatory Visit (INDEPENDENT_AMBULATORY_CARE_PROVIDER_SITE_OTHER): Payer: Medicare Other | Admitting: Family Medicine

## 2015-08-28 VITALS — BP 133/87 | HR 71 | Temp 98.8°F | Ht 66.5 in | Wt 147.2 lb

## 2015-08-28 DIAGNOSIS — R739 Hyperglycemia, unspecified: Secondary | ICD-10-CM

## 2015-08-28 DIAGNOSIS — M25571 Pain in right ankle and joints of right foot: Secondary | ICD-10-CM

## 2015-08-28 DIAGNOSIS — R740 Nonspecific elevation of levels of transaminase and lactic acid dehydrogenase [LDH]: Secondary | ICD-10-CM | POA: Diagnosis not present

## 2015-08-28 DIAGNOSIS — R7401 Elevation of levels of liver transaminase levels: Secondary | ICD-10-CM

## 2015-08-28 DIAGNOSIS — R42 Dizziness and giddiness: Secondary | ICD-10-CM | POA: Diagnosis not present

## 2015-08-28 LAB — POCT GLYCOSYLATED HEMOGLOBIN (HGB A1C): Hemoglobin A1C: 5.2

## 2015-08-28 NOTE — Progress Notes (Signed)
   HPI  Patient presents today for lab follow-up.  She had labs performed by her neurologist in [facility]. They stated that her liver labs were often they needed to see a family doctor.  Her ALT is elevated at 44. She has no right upper quadrant pain. She is on several psychotropic medications including Risperdal, temazepam, Klonopin, primidone, Wellbutrin, Celexa, gabapentin.  R ankle pain After rolling her ankle 3 weeks ago No problem ebaring weight No seliing or redness Mild forefoot pain   PMH: Smoking status noted ROS: Per HPI  Objective: BP 133/87 mmHg  Pulse 71  Temp(Src) 98.8 F (37.1 C) (Oral)  Ht 5' 6.5" (1.689 m)  Wt 147 lb 3.2 oz (66.769 kg)  BMI 23.41 kg/m2 Gen: NAD, alert, cooperative with exam HEENT: NCAT, EOMI CV: RRR, good S1/S2, no murmur Resp: CTABL, no wheezes, non-labored Ext: No edema, warm Neuro: Alert and oriented, No gross deficits  Foot: No redness or swelling of the right ankle or foot Mild tenderness to palpation of the third and fourth metatarsals in the right foot, also tenderness to palpation of the tendons distal to the lateral malleolus on the right side No joint laxity Normal gait  Assessment and plan:  # dizziness I am suspicious of medication effect, no signs of vertigo or central lesion. She does have multiple sclerosis, however I do not believe this is causing it.  # Hyperglycemia Checking A1c, nonfasting labs  # Elevated liver function test Mild, repeat in 3 months  # forefoot pain, ankle pain After ankle injury, likely mild ankle sprain Ice, compression Return to clinic with any concerns     Angel Apple, MD [facility] 08/28/2015, 3:46 PM

## 2015-08-28 NOTE — Patient Instructions (Signed)
Great to see you!  Come back in 3 months to look at the labs again.

## 2015-09-02 DIAGNOSIS — F411 Generalized anxiety disorder: Secondary | ICD-10-CM | POA: Diagnosis not present

## 2015-09-04 ENCOUNTER — Encounter: Payer: Self-pay | Admitting: Gastroenterology

## 2015-09-22 DIAGNOSIS — F411 Generalized anxiety disorder: Secondary | ICD-10-CM | POA: Diagnosis not present

## 2015-09-24 DIAGNOSIS — F411 Generalized anxiety disorder: Secondary | ICD-10-CM | POA: Diagnosis not present

## 2015-10-13 DIAGNOSIS — F411 Generalized anxiety disorder: Secondary | ICD-10-CM | POA: Diagnosis not present

## 2015-11-02 ENCOUNTER — Encounter: Payer: Self-pay | Admitting: *Deleted

## 2015-11-08 ENCOUNTER — Encounter (HOSPITAL_COMMUNITY): Payer: Self-pay

## 2015-11-08 ENCOUNTER — Inpatient Hospital Stay (HOSPITAL_COMMUNITY)
Admission: EM | Admit: 2015-11-08 | Discharge: 2015-11-11 | DRG: 480 | Disposition: A | Discharge status: Skilled Nursing Facility | Payer: Medicare Other | Attending: Internal Medicine | Admitting: Internal Medicine

## 2015-11-08 ENCOUNTER — Emergency Department (HOSPITAL_COMMUNITY): Payer: Medicare Other

## 2015-11-08 DIAGNOSIS — W19XXXA Unspecified fall, initial encounter: Secondary | ICD-10-CM | POA: Diagnosis not present

## 2015-11-08 DIAGNOSIS — Z818 Family history of other mental and behavioral disorders: Secondary | ICD-10-CM | POA: Diagnosis not present

## 2015-11-08 DIAGNOSIS — G35 Multiple sclerosis: Secondary | ICD-10-CM | POA: Diagnosis present

## 2015-11-08 DIAGNOSIS — I10 Essential (primary) hypertension: Secondary | ICD-10-CM | POA: Diagnosis not present

## 2015-11-08 DIAGNOSIS — Y92009 Unspecified place in unspecified non-institutional (private) residence as the place of occurrence of the external cause: Secondary | ICD-10-CM

## 2015-11-08 DIAGNOSIS — N179 Acute kidney failure, unspecified: Secondary | ICD-10-CM

## 2015-11-08 DIAGNOSIS — M545 Low back pain: Secondary | ICD-10-CM | POA: Diagnosis not present

## 2015-11-08 DIAGNOSIS — Z87442 Personal history of urinary calculi: Secondary | ICD-10-CM

## 2015-11-08 DIAGNOSIS — R030 Elevated blood-pressure reading, without diagnosis of hypertension: Secondary | ICD-10-CM | POA: Diagnosis present

## 2015-11-08 DIAGNOSIS — R627 Adult failure to thrive: Secondary | ICD-10-CM | POA: Diagnosis present

## 2015-11-08 DIAGNOSIS — S72002D Fracture of unspecified part of neck of left femur, subsequent encounter for closed fracture with routine healing: Secondary | ICD-10-CM | POA: Diagnosis not present

## 2015-11-08 DIAGNOSIS — Y92099 Unspecified place in other non-institutional residence as the place of occurrence of the external cause: Secondary | ICD-10-CM | POA: Diagnosis not present

## 2015-11-08 DIAGNOSIS — E43 Unspecified severe protein-calorie malnutrition: Secondary | ICD-10-CM | POA: Diagnosis not present

## 2015-11-08 DIAGNOSIS — F418 Other specified anxiety disorders: Secondary | ICD-10-CM | POA: Diagnosis not present

## 2015-11-08 DIAGNOSIS — R0602 Shortness of breath: Secondary | ICD-10-CM | POA: Diagnosis not present

## 2015-11-08 DIAGNOSIS — N200 Calculus of kidney: Secondary | ICD-10-CM | POA: Diagnosis not present

## 2015-11-08 DIAGNOSIS — F339 Major depressive disorder, recurrent, unspecified: Secondary | ICD-10-CM

## 2015-11-08 DIAGNOSIS — M6281 Muscle weakness (generalized): Secondary | ICD-10-CM | POA: Diagnosis not present

## 2015-11-08 DIAGNOSIS — Z09 Encounter for follow-up examination after completed treatment for conditions other than malignant neoplasm: Secondary | ICD-10-CM

## 2015-11-08 DIAGNOSIS — F329 Major depressive disorder, single episode, unspecified: Secondary | ICD-10-CM | POA: Diagnosis not present

## 2015-11-08 DIAGNOSIS — Z79899 Other long term (current) drug therapy: Secondary | ICD-10-CM

## 2015-11-08 DIAGNOSIS — R41841 Cognitive communication deficit: Secondary | ICD-10-CM | POA: Diagnosis not present

## 2015-11-08 DIAGNOSIS — R05 Cough: Secondary | ICD-10-CM | POA: Diagnosis not present

## 2015-11-08 DIAGNOSIS — F419 Anxiety disorder, unspecified: Secondary | ICD-10-CM | POA: Diagnosis present

## 2015-11-08 DIAGNOSIS — E860 Dehydration: Secondary | ICD-10-CM | POA: Diagnosis present

## 2015-11-08 DIAGNOSIS — R278 Other lack of coordination: Secondary | ICD-10-CM | POA: Diagnosis not present

## 2015-11-08 DIAGNOSIS — S72092A Other fracture of head and neck of left femur, initial encounter for closed fracture: Secondary | ICD-10-CM | POA: Diagnosis present

## 2015-11-08 DIAGNOSIS — M25552 Pain in left hip: Secondary | ICD-10-CM | POA: Diagnosis not present

## 2015-11-08 DIAGNOSIS — F319 Bipolar disorder, unspecified: Secondary | ICD-10-CM

## 2015-11-08 DIAGNOSIS — S728X2A Other fracture of left femur, initial encounter for closed fracture: Secondary | ICD-10-CM | POA: Diagnosis not present

## 2015-11-08 DIAGNOSIS — Z6820 Body mass index (BMI) 20.0-20.9, adult: Secondary | ICD-10-CM

## 2015-11-08 DIAGNOSIS — N189 Chronic kidney disease, unspecified: Secondary | ICD-10-CM | POA: Diagnosis not present

## 2015-11-08 DIAGNOSIS — S72042A Displaced fracture of base of neck of left femur, initial encounter for closed fracture: Secondary | ICD-10-CM | POA: Diagnosis not present

## 2015-11-08 DIAGNOSIS — F3189 Other bipolar disorder: Secondary | ICD-10-CM | POA: Diagnosis not present

## 2015-11-08 DIAGNOSIS — S72002K Fracture of unspecified part of neck of left femur, subsequent encounter for closed fracture with nonunion: Secondary | ICD-10-CM | POA: Diagnosis not present

## 2015-11-08 DIAGNOSIS — G2581 Restless legs syndrome: Secondary | ICD-10-CM | POA: Diagnosis not present

## 2015-11-08 DIAGNOSIS — Z419 Encounter for procedure for purposes other than remedying health state, unspecified: Secondary | ICD-10-CM

## 2015-11-08 DIAGNOSIS — G250 Essential tremor: Secondary | ICD-10-CM | POA: Diagnosis not present

## 2015-11-08 DIAGNOSIS — S72002A Fracture of unspecified part of neck of left femur, initial encounter for closed fracture: Secondary | ICD-10-CM | POA: Diagnosis present

## 2015-11-08 DIAGNOSIS — W19XXXD Unspecified fall, subsequent encounter: Secondary | ICD-10-CM | POA: Diagnosis not present

## 2015-11-08 DIAGNOSIS — G894 Chronic pain syndrome: Secondary | ICD-10-CM | POA: Diagnosis not present

## 2015-11-08 DIAGNOSIS — R2689 Other abnormalities of gait and mobility: Secondary | ICD-10-CM | POA: Diagnosis not present

## 2015-11-08 DIAGNOSIS — R251 Tremor, unspecified: Secondary | ICD-10-CM | POA: Diagnosis present

## 2015-11-08 LAB — PROTIME-INR
INR: 1.19 (ref 0.00–1.49)
PROTHROMBIN TIME: 15.2 s (ref 11.6–15.2)

## 2015-11-08 LAB — COMPREHENSIVE METABOLIC PANEL
ALT: 41 U/L (ref 14–54)
AST: 103 U/L — ABNORMAL HIGH (ref 15–41)
Albumin: 5.2 g/dL — ABNORMAL HIGH (ref 3.5–5.0)
Alkaline Phosphatase: 107 U/L (ref 38–126)
Anion gap: 19 — ABNORMAL HIGH (ref 5–15)
BILIRUBIN TOTAL: 1.5 mg/dL — AB (ref 0.3–1.2)
BUN: 23 mg/dL — AB (ref 6–20)
CALCIUM: 10 mg/dL (ref 8.9–10.3)
CO2: 19 mmol/L — AB (ref 22–32)
Chloride: 102 mmol/L (ref 101–111)
Creatinine, Ser: 1.26 mg/dL — ABNORMAL HIGH (ref 0.44–1.00)
GFR, EST AFRICAN AMERICAN: 53 mL/min — AB (ref >60)
GFR, EST NON AFRICAN AMERICAN: 46 mL/min — AB (ref >60)
GLUCOSE: 120 mg/dL — AB (ref 65–99)
Potassium: 3.9 mmol/L (ref 3.5–5.1)
Sodium: 140 mmol/L (ref 135–145)
Total Protein: 8.7 g/dL — ABNORMAL HIGH (ref 6.5–8.1)

## 2015-11-08 LAB — CBC
HEMATOCRIT: 42.8 % (ref 36.0–46.0)
HEMOGLOBIN: 14.9 g/dL (ref 12.0–15.0)
MCH: 31.8 pg (ref 26.0–34.0)
MCHC: 34.8 g/dL (ref 30.0–36.0)
MCV: 91.5 fL (ref 78.0–100.0)
Platelets: 208 10*3/uL (ref 150–400)
RBC: 4.68 MIL/uL (ref 3.87–5.11)
RDW: 12.5 % (ref 11.5–15.5)
WBC: 11.2 10*3/uL — ABNORMAL HIGH (ref 4.0–10.5)

## 2015-11-08 LAB — SURGICAL PCR SCREEN
MRSA, PCR: NEGATIVE
STAPHYLOCOCCUS AUREUS: NEGATIVE

## 2015-11-08 LAB — ACETAMINOPHEN LEVEL

## 2015-11-08 LAB — SALICYLATE LEVEL: Salicylate Lvl: <4 mg/dL (ref 2.8–30.0)

## 2015-11-08 LAB — ETHANOL: Alcohol, Ethyl (B): <5 mg/dL (ref <5)

## 2015-11-08 MED ORDER — SODIUM CHLORIDE 0.9 % IV BOLUS (SEPSIS)
1000.0000 mL | Freq: Once | INTRAVENOUS | Status: AC
  Administered 2015-11-08: 1000 mL via INTRAVENOUS

## 2015-11-08 MED ORDER — CHLORHEXIDINE GLUCONATE 4 % EX LIQD: 60.0000 mL | Freq: Once | CUTANEOUS | Status: DC

## 2015-11-08 MED ORDER — POVIDONE-IODINE 10 % EX SWAB: 2.0000 "application " | Freq: Once | CUTANEOUS | Status: DC

## 2015-11-08 MED ORDER — ACETAMINOPHEN 650 MG RE SUPP: 650.0000 mg | Freq: Four times a day (QID) | RECTAL | Status: DC | PRN

## 2015-11-08 MED ORDER — CLONAZEPAM 0.5 MG PO TABS
0.5000 mg | ORAL_TABLET | Freq: Three times a day (TID) | ORAL | Status: DC | PRN
  Administered 2015-11-08 – 2015-11-10 (×4): 0.5 mg via ORAL
  Filled 2015-11-08 (×5): qty 1

## 2015-11-08 MED ORDER — ONDANSETRON HCL 4 MG/2ML IJ SOLN: 4.0000 mg | Freq: Four times a day (QID) | INTRAMUSCULAR | Status: DC | PRN

## 2015-11-08 MED ORDER — ONDANSETRON HCL 4 MG PO TABS: 4.0000 mg | ORAL_TABLET | Freq: Four times a day (QID) | ORAL | Status: DC | PRN

## 2015-11-08 MED ORDER — ENSURE ENLIVE PO LIQD
237.0000 mL | Freq: Two times a day (BID) | ORAL | Status: DC
  Administered 2015-11-10 – 2015-11-11 (×3): 237 mL via ORAL

## 2015-11-08 MED ORDER — CEFAZOLIN SODIUM-DEXTROSE 2-4 GM/100ML-% IV SOLN
2.0000 g | INTRAVENOUS | Status: AC
  Administered 2015-11-09: 2 g via INTRAVENOUS

## 2015-11-08 MED ORDER — RISPERIDONE 1 MG PO TABS
1.0000 mg | ORAL_TABLET | Freq: Every evening | ORAL | Status: DC
  Administered 2015-11-08 – 2015-11-10 (×2): 1 mg via ORAL
  Filled 2015-11-08 (×4): qty 1

## 2015-11-08 MED ORDER — MORPHINE SULFATE (PF) 2 MG/ML IV SOLN
2.0000 mg | INTRAVENOUS | Status: DC | PRN
  Administered 2015-11-08 – 2015-11-09 (×5): 2 mg via INTRAVENOUS
  Filled 2015-11-08 (×5): qty 1

## 2015-11-08 MED ORDER — BUPROPION HCL ER (XL) 300 MG PO TB24
300.0000 mg | ORAL_TABLET | Freq: Every day | ORAL | Status: DC
  Administered 2015-11-08 – 2015-11-11 (×4): 300 mg via ORAL
  Filled 2015-11-08 (×5): qty 1

## 2015-11-08 MED ORDER — LORAZEPAM 2 MG/ML IJ SOLN
1.0000 mg | Freq: Once | INTRAMUSCULAR | Status: AC
  Administered 2015-11-08: 1 mg via INTRAVENOUS
  Filled 2015-11-08: qty 1

## 2015-11-08 MED ORDER — ACETAMINOPHEN 325 MG PO TABS
650.0000 mg | ORAL_TABLET | Freq: Four times a day (QID) | ORAL | Status: DC | PRN
  Administered 2015-11-08: 650 mg via ORAL
  Filled 2015-11-08: qty 2

## 2015-11-08 MED ORDER — GABAPENTIN 400 MG PO CAPS
400.0000 mg | ORAL_CAPSULE | Freq: Three times a day (TID) | ORAL | Status: DC
  Administered 2015-11-08 – 2015-11-11 (×8): 400 mg via ORAL
  Filled 2015-11-08 (×8): qty 1

## 2015-11-08 MED ORDER — CITALOPRAM HYDROBROMIDE 20 MG PO TABS
40.0000 mg | ORAL_TABLET | Freq: Every day | ORAL | Status: DC
  Administered 2015-11-09 – 2015-11-11 (×3): 40 mg via ORAL
  Filled 2015-11-08: qty 2
  Filled 2015-11-08: qty 1
  Filled 2015-11-08: qty 2
  Filled 2015-11-08: qty 1

## 2015-11-08 MED ORDER — SODIUM CHLORIDE 0.9 % IV SOLN
INTRAVENOUS | Status: DC
  Administered 2015-11-09 (×2): via INTRAVENOUS
  Administered 2015-11-09 (×2): 1000 mL via INTRAVENOUS

## 2015-11-08 MED ORDER — OXYCODONE HCL 5 MG PO TABS
5.0000 mg | ORAL_TABLET | ORAL | Status: DC | PRN
  Administered 2015-11-08 – 2015-11-11 (×5): 5 mg via ORAL
  Filled 2015-11-08 (×5): qty 1

## 2015-11-08 NOTE — H&P (Signed)
History and Physical    Angel Maddox [id number] DOB: [birth year] DOA: 11/08/2015  PCP: Redge Gainer, MD  Patient coming from: Home  Chief Complaint: Status post fall  HPI: Angel Maddox is a 60 y.o. female with medical history significant of major depression and bipolar disorder who presents to the emergency room with complaints of left hip pain. Family members reporting that she has had severe depression since the passing away of her son over a year ago. Husband states that she has spent the majority of her time in bed with little mobility, having decreased by mouth intake, "sleeping all day long." She has experienced severe depressive symptoms, tearfulness, having feelings of hopelessness. Last Friday she got up to use the restroom having a fall in the hallway. Her husband helped her get back into bed and had not been out of bed since then, which according to her husband was not far from baseline. They decided to come into the emergency room today. Angel. Droke does not participate much in her history, majority of history provided by her husband was at bedside. She is tearful.  ED Course: Workup in the emergency department included hip x-ray did not reveal an acute transcervical mildly impacted angulated left femoral neck fracture. Emergency room provider discussed case with Dr. Rolena Infante of orthopedic surgery requesting medicine to admit with orthopedic consultation.  Review of Systems: As per HPI otherwise 10 point review of systems negative.    Past Medical History  Diagnosis Date  . Anxiety   . Chronic kidney disease     kidney stone  . H/O hiatal hernia   . Multiple sclerosis ([place])      Had for 15 years  . Tremors of nervous system   . Depression   . Bipolar 1 disorder [facility])     Past Surgical History  Procedure Laterality Date  . Tubal ligation       reports that she has never smoked. She has never used smokeless tobacco. She reports that she drinks alcohol. She  reports that she does not use illicit drugs.  No Known Allergies  Family History reviewed non pertinent  Prior to Admission medications   Medication Sig Start Date End Date Taking? Authorizing Provider  buPROPion (WELLBUTRIN XL) 300 MG 24 hr tablet Take 300 mg by mouth daily.   Yes Historical Provider, MD  citalopram (CELEXA) 40 MG tablet Take 40 mg by mouth daily with breakfast.   Yes Historical Provider, MD  clonazePAM (KLONOPIN) 0.5 MG tablet Take 0.5-1 mg by mouth 3 (three) times daily as needed for anxiety. Anxiety   Yes Historical Provider, MD  gabapentin (NEURONTIN) 800 MG tablet Take 800 mg by mouth 4 (four) times daily.    Yes Historical Provider, MD  loratadine-pseudoephedrine ([employer] ALLERGY RELIEF-D) 10-240 MG 24 hr tablet Take 1 tablet by mouth daily.   Yes Historical Provider, MD  Multiple Vitamin (MULTIVITAMIN WITH MINERALS) TABS Take 1 tablet by mouth daily.   Yes Historical Provider, MD  risperiDONE (RISPERDAL) 1 MG tablet Take 1 tablet (1 mg total) by mouth every evening. 02/11/15  Yes Timmothy Euler, MD  primidone (MYSOLINE) 50 MG tablet Take 250 mg by mouth 4 (four) times daily.     Historical Provider, MD    Physical Exam: Filed Vitals:   11/08/15 0822 11/08/15 1039  BP: 155/115 164/122  Pulse: 110 100  Temp: 98.4 F (36.9 C) 98.1 F (36.7 C)  TempSrc: Oral Oral  Resp: 24 25  SpO2: 100%  94%    Constitutional: She is tearful, mild distress, anxious appearing. Filed Vitals:   11/08/15 0822 11/08/15 1039  BP: 155/115 164/122  Pulse: 110 100  Temp: 98.4 F (36.9 C) 98.1 F (36.7 C)  TempSrc: Oral Oral  Resp: 24 25  SpO2: 100% 94%   Eyes: PERRL, lids and conjunctivae normal ENMT: Mucous membranes are moist. Posterior pharynx clear of any exudate or lesions.Normal dentition.  Neck: normal, supple, no masses, no thyromegaly Respiratory: clear to auscultation bilaterally, no wheezing, no crackles. Normal respiratory effort. No accessory muscle use.    Cardiovascular: Regular rate and rhythm, no murmurs / rubs / gallops. No extremity edema. 2+ pedal pulses. No carotid bruits.  Abdomen: no tenderness, no masses palpated. No hepatosplenomegaly. Bowel sounds positive.  Musculoskeletal: There is pain with passive and active movement of her left lower extremity Skin: no rashes, lesions, ulcers. No induration Neurologic: CN 2-12 grossly intact. Sensation intact, DTR normal. Strength 5/5 in all 4.  Psychiatric: She is in mild distress, tearful throughout my evaluation, does not contribute very much to history.  Labs on Admission: I have personally reviewed following labs and imaging studies  CBC:  Recent Labs Lab 11/08/15 0839  WBC 11.2*  HGB 14.9  HCT 42.8  MCV 91.5  PLT 123XX123   Basic Metabolic Panel:  Recent Labs Lab 11/08/15 0839  NA 140  K 3.9  CL 102  CO2 19*  GLUCOSE 120*  BUN 23*  CREATININE 1.26*  CALCIUM 10.0   GFR: CrCl cannot be calculated (Unknown ideal weight.). Liver Function Tests:  Recent Labs Lab 11/08/15 0839  AST 103*  ALT 41  ALKPHOS 107  BILITOT 1.5*  PROT 8.7*  ALBUMIN 5.2*   No results for input(s): LIPASE, AMYLASE in the last 168 hours. No results for input(s): AMMONIA in the last 168 hours. Coagulation Profile: No results for input(s): INR, PROTIME in the last 168 hours. Cardiac Enzymes: No results for input(s): CKTOTAL, CKMB, CKMBINDEX, TROPONINI in the last 168 hours. BNP (last 3 results) No results for input(s): PROBNP in the last 8760 hours. HbA1C: No results for input(s): HGBA1C in the last 72 hours. CBG: No results for input(s): GLUCAP in the last 168 hours. Lipid Profile: No results for input(s): CHOL, HDL, LDLCALC, TRIG, CHOLHDL, LDLDIRECT in the last 72 hours. Thyroid Function Tests: No results for input(s): TSH, T4TOTAL, FREET4, T3FREE, THYROIDAB in the last 72 hours. Anemia Panel: No results for input(s): VITAMINB12, FOLATE, FERRITIN, TIBC, IRON, RETICCTPCT in the last 72  hours. Urine analysis: No results found for: COLORURINE, APPEARANCEUR, LABSPEC, PHURINE, GLUCOSEU, HGBUR, BILIRUBINUR, KETONESUR, PROTEINUR, UROBILINOGEN, NITRITE, LEUKOCYTESUR Sepsis Labs: !!!!!!!!!!!!!!!!!!!!!!!!!!!!!!!!!!!!!!!!!!!! @LABRCNTIP (procalcitonin:4,lacticidven:4) )No results found for this or any previous visit (from the past 240 hour(s)).   Radiological Exams on Admission: Dg Chest 2 View  11/08/2015  CLINICAL DATA:  60 year old female with 2-3 day history of cough. History of fall yesterday, currently unable to walk. Pain in the left hip. Shortness of breath. EXAM: CHEST  2 VIEW COMPARISON:  No priors. FINDINGS: Lung volumes are normal. No consolidative airspace disease. No pleural effusions. No pneumothorax. No pulmonary nodule or mass noted. Pulmonary vasculature and the cardiomediastinal silhouette are within normal limits. IMPRESSION: No radiographic evidence of acute cardiopulmonary disease. Electronically Signed   By: Vinnie Langton M.D.   On: 11/08/2015 10:24   Dg Hip Unilat With Pelvis 2-3 Views Left  11/08/2015  CLINICAL DATA:  60 year old female with history of trauma from a fall yesterday complaining of pain in the  left hip today, unable to walk. EXAM: DG HIP (WITH OR WITHOUT PELVIS) 2-3V LEFT COMPARISON:  No priors. FINDINGS: There is an acute transcervical left-sided femoral neck fracture with mild valgus angulation and mild impaction. Femoral head remains located in the left acetabulum. Bony pelvis appears intact. Right proximal femur as visualized is intact. Mild moderate degenerative changes of osteoarthritis are noted in the hip joints bilaterally. IMPRESSION: 1. Acute transcervical mildly impacted mildly angulated left femoral neck fracture, as above. 2. Mild to moderate osteoarthritis in the hip joints bilaterally. Electronically Signed   By: Vinnie Langton M.D.   On: 11/08/2015 10:29    EKG: Independently reviewed.   Assessment/Plan Principal Problem:    Closed left hip fracture [facility]) Active Problems:   Fall at home   Femoral neck fracture, left, closed, initial encounter  1.  Left femoral neck fracture. Angel Maddox having a fall at home last Friday. [place] reports that she has been minimally active over the past months spending the majority of her day sleeping. Likely severely deconditioned and possibly malnourished, having a fall on her way to the bathroom. Imaging studies today showing the presence of an acute left femoral neck fracture. Dr. Rolena Infante of orthopedic surgery was consulted by emergency room provider. Will keep her NPO, placed on IV fluids, SCD's, await further recommendations from orthopedic surgery.  2.  Major depression/history of bipolar disorder. She lost her son over year ago. Her husband states that she has had severe depression, affecting her ability to function normally. She spends the majority the day sleeping in bed. Psychiatry has been consulted. Will continue her home regimen with citalopram, Risperdal and bupropion until evaluated by psychiatry.  3.  Acute kidney injury. Likely secondary to dehydration from minimal by mouth intake, lab showing creatinine of 1.26, provide IV fluid resuscitation. Repeat labs in a.m.  4.  History of multiple sclerosis. Currently stable    DVT prophylaxis: SCD's Code Status: Full code Family Communication: Spoke to her husband is present at bedside Disposition Plan: Plan to admit to the inpatient service anticipate will require greater than 2 nights hospitalization Consults called: Emergency room provider calling Dr. Rolena Infante of orthopedic surgery Admission status: MedSurg   Kelvin Cellar MD [facility] Pager [phone]  If 7PM-7AM, please contact night-coverage [email or web address] Password [facility]  11/08/2015, 12:59 PM

## 2015-11-08 NOTE — ED Notes (Signed)
Patient unable to provide urine sample at this time

## 2015-11-08 NOTE — ED Notes (Signed)
Patient attempted to use the bedpan and was unsuccessful.

## 2015-11-08 NOTE — ED Notes (Signed)
Patient's husband states he brought the patient in because she "sleeps all the time, confused, and has a bad cough." patient is very anxious and tearful. When asked a question, the patient grunts and does not answer. Patient is very fidgety and talks to husband when she does talk. Patient denies alcohol or drug use. Patient denies SI/Hi, visual or auditory hallucinations.

## 2015-11-08 NOTE — Consult Note (Signed)
Patient ID: Angel Maddox MRN: [id number] DOB/AGE: 1955-09-24 60 y.o.  Admit date: 11/08/2015  Admission Diagnoses:  Principal Problem:   Closed left hip fracture [facility]) Active Problems:   Fall at home   Femoral neck fracture, left, closed, initial encounter   HPI: Pleasant 60 year old female pt with a past medical hx significant for MS and bipolar.  She fell at her home on Friday evening.  She used a walker on Saturday around the house.  Her husband said this morning it was obvious the pt was not improving and he brought her to the ED.    Past Medical History: Past Medical History  Diagnosis Date  . Anxiety   . Chronic kidney disease     kidney stone  . H/O hiatal hernia   . Multiple sclerosis ([place])      Had for 15 years  . Tremors of nervous system   . Depression   . Bipolar 1 disorder [facility])     Surgical History: Past Surgical History  Procedure Laterality Date  . Tubal ligation      Family History: History reviewed. No pertinent family history.  Social History: Social History   Social History  . Marital Status: Married    Spouse Name: N/A  . Number of Children: N/A  . Years of Education: N/A   Occupational History  . Not on file.   Social History Main Topics  . Smoking status: Never Smoker   . Smokeless tobacco: Never Used  . Alcohol Use: Yes     Comment: socially  . Drug Use: No  . Sexual Activity: Not on file   Other Topics Concern  . Not on file   Social History Narrative    Allergies: Review of patient's allergies indicates no known allergies.  Medications: I have reviewed the patient's current medications.  Vital Signs: Patient Vitals for the past 24 hrs:  BP Temp Temp src Pulse Resp SpO2 Height Weight  11/08/15 1421 (!) 170/90 mmHg 97.8 F (36.6 C) Oral 93 (!) 24 99 % - -  11/08/15 1415 (!) 148/110 mmHg - - 93 (!) 22 - - -  11/08/15 1340 (!) 168/118 mmHg 98.9 F (37.2 C) Oral (!) 109 (!) 24 98 % 5\' 10"  (1.778 m) 63.504 kg  (140 lb)  11/08/15 1200 104/60 mmHg - - - 23 - - -  11/08/15 1039 (!) 164/122 mmHg 98.1 F (36.7 C) Oral 100 25 94 % - -  11/08/15 0822 (!) 155/115 mmHg 98.4 F (36.9 C) Oral 110 24 100 % - -    Radiology: Dg Chest 2 View  11/08/2015  CLINICAL DATA:  60 year old female with 2-3 day history of cough. History of fall yesterday, currently unable to walk. Pain in the left hip. Shortness of breath. EXAM: CHEST  2 VIEW COMPARISON:  No priors. FINDINGS: Lung volumes are normal. No consolidative airspace disease. No pleural effusions. No pneumothorax. No pulmonary nodule or mass noted. Pulmonary vasculature and the cardiomediastinal silhouette are within normal limits. IMPRESSION: No radiographic evidence of acute cardiopulmonary disease. Electronically Signed   By: Vinnie Langton M.D.   On: 11/08/2015 10:24   Dg Hip Unilat With Pelvis 2-3 Views Left  11/08/2015  CLINICAL DATA:  60 year old female with history of trauma from a fall yesterday complaining of pain in the left hip today, unable to walk. EXAM: DG HIP (WITH OR WITHOUT PELVIS) 2-3V LEFT COMPARISON:  No priors. FINDINGS: There is an acute transcervical left-sided femoral neck  fracture with mild valgus angulation and mild impaction. Femoral head remains located in the left acetabulum. Bony pelvis appears intact. Right proximal femur as visualized is intact. Mild moderate degenerative changes of osteoarthritis are noted in the hip joints bilaterally. IMPRESSION: 1. Acute transcervical mildly impacted mildly angulated left femoral neck fracture, as above. 2. Mild to moderate osteoarthritis in the hip joints bilaterally. Electronically Signed   By: Vinnie Langton M.D.   On: 11/08/2015 10:29    Labs:  Recent Labs  11/08/15 0839  WBC 11.2*  RBC 4.68  HCT 42.8  PLT 208    Recent Labs  11/08/15 0839  NA 140  K 3.9  CL 102  CO2 19*  BUN 23*  CREATININE 1.26*  GLUCOSE 120*  CALCIUM 10.0   No results for input(s): LABPT, INR in the  last 72 hours.  Review of Systems: ROS  Physical Exam: Neurologically intact ABD soft Sensation intact distally  Severe pain of the left hip  Assessment and Plan: NPO after midnight Dr. Rolena Infante will consult Dr. Lyla Glassing - consider surgical intervention tomorrow Consulted pharmacy - we are restarting her bipolar medications Talked to the nurse - we will alternate the IV Morphine and PO Oxy every 2 hours  Pt may have her regularly prescribed Klonopin tid prn   [facility] [phone]

## 2015-11-08 NOTE — Progress Notes (Signed)
Permission was granted by Angel Maddox and her spouse to discuss her plan of care with her daughter, last name Odell.  Will add her to contact list.  She wanted to report that the last time her mom (the Angel Maddox) acted "loopy" her ammonia levels were high. She wants [place] to check them.  She also said another family member who saw her in the hospital thought she was possible having seizures due to Angel Maddox's behavior. She reported Angel Maddox was blinking her eyes oddly and smacking her mouth.  Daughter would like Angel Maddox to have Angel Maddox seen by a neurologist while she is here.  Angel Maddox coughing frequently, she may need to start her allergy med of  Claritin D.

## 2015-11-08 NOTE — ED Notes (Signed)
Dr. Delo at bedside. 

## 2015-11-08 NOTE — ED Notes (Signed)
Assisted patient to the bathroom to obtain urine sample, patient unable to follow simple commands. Her urine amount was very little and un measurable, along with a very small bowel movement. Patient also placed toilet paper in the sample after being told not to. Therefor the urine was contaminated and not able to be sent for screening. Plan to collect urine when possible.

## 2015-11-08 NOTE — ED Provider Notes (Signed)
CSN: [id number]     Arrival date & time 11/08/15  0813 History   First MD Initiated Contact with Patient 11/08/15 [phone]     Chief Complaint  Patient presents with  . Depression  . Anxiety  . Altered Mental Status     (Consider location/radiation/quality/duration/timing/severity/associated sxs/prior Treatment) HPI Comments: Patient is a 60 year old female with history of bipolar disorder, anxiety, and depression. She presents for evaluation of cough, anxiety, and confusion which has been worsening for the past several months. The patient adds little to the history other than stating "I want my son back" and "it hurts" referring to her hip. Her husband states that she did fall several days ago.  She has been very depressed since her son passed away slightly over one year ago.  The history is provided by the patient.    Past Medical History  Diagnosis Date  . Anxiety   . Chronic kidney disease     kidney stone  . H/O hiatal hernia   . Multiple sclerosis ([place])      Had for 15 years  . Tremors of nervous system   . Depression   . Bipolar 1 disorder [facility])    Past Surgical History  Procedure Laterality Date  . Tubal ligation     History reviewed. No pertinent family history. Social History  Substance Use Topics  . Smoking status: Never Smoker   . Smokeless tobacco: Never Used  . Alcohol Use: Yes     Comment: socially   OB History    No data available     Review of Systems  All other systems reviewed and are negative.     Allergies  Review of patient's allergies indicates no known allergies.  Home Medications   Prior to Admission medications   Medication Sig Start Date End Date Taking? Authorizing Provider  buPROPion (WELLBUTRIN XL) 300 MG 24 hr tablet Take 300 mg by mouth daily.    Historical Provider, MD  citalopram (CELEXA) 40 MG tablet Take 40 mg by mouth daily with breakfast.    Historical Provider, MD  clonazePAM (KLONOPIN) 0.5 MG tablet Take 0.5 mg by mouth  2 (two) times daily as needed. Anxiety    Historical Provider, MD  gabapentin (NEURONTIN) 800 MG tablet Take 400-800 mg by mouth 3 (three) times daily. She takes 1 tablet in the morning 1/2 at lunch and 1 at bedtime    Historical Provider, MD  methylphenidate [facility]) 10 mg/9hr patch Place 10 mg onto the skin daily. wear patch for 9 hours only each day    Historical Provider, MD  Multiple Vitamin (MULTIVITAMIN WITH MINERALS) TABS Take 1 tablet by mouth daily.    Historical Provider, MD  primidone (MYSOLINE) 50 MG tablet Take 250 mg by mouth 4 (four) times daily.     Historical Provider, MD  risperiDONE (RISPERDAL) 1 MG tablet Take 1 tablet (1 mg total) by mouth every evening. Patient taking differently: Take 4 mg by mouth every evening.  02/11/15   Timmothy Euler, MD  temazepam (RESTORIL) 15 MG capsule Take 15 mg by mouth at bedtime.    Historical Provider, MD   BP 155/115 mmHg  Pulse 110  Temp(Src) 98.4 F (36.9 C) (Oral)  Resp 24  SpO2 100% Physical Exam  Constitutional: She is oriented to person, place, and time. She appears well-developed and well-nourished.  Patient appears extremely anxious, fidgety, restless, and is hyperventilating.  HENT:  Head: Normocephalic and atraumatic.  Eyes: EOM are normal. Pupils are  equal, round, and reactive to light.  Neck: Normal range of motion. Neck supple.  Cardiovascular: Normal rate and regular rhythm.  Exam reveals no gallop and no friction rub.   No murmur heard. Pulmonary/Chest: Effort normal and breath sounds normal. No respiratory distress. She has no wheezes.  Abdominal: Soft. Bowel sounds are normal. She exhibits no distension. There is no tenderness.  Musculoskeletal: Normal range of motion. She exhibits no edema.  Neurological: She is alert and oriented to person, place, and time. No cranial nerve deficit. She exhibits normal muscle tone. Coordination normal.  Neurologic exam is somewhat limited secondary to her anxious state,  however she appears intact.  Skin: Skin is warm.  Psychiatric: Her mood appears anxious. She is hyperactive. She expresses no homicidal and no suicidal ideation. She expresses no suicidal plans and no homicidal plans. She is noncommunicative.  Nursing note and vitals reviewed.   ED Course  Procedures (including critical care time) Labs Review Labs Reviewed  CBC - Abnormal; Notable for the following:    WBC 11.2 (*)    All other components within normal limits  COMPREHENSIVE METABOLIC PANEL  ETHANOL  SALICYLATE LEVEL  ACETAMINOPHEN LEVEL  URINE RAPID DRUG SCREEN, HOSP PERFORMED  URINALYSIS, ROUTINE W REFLEX MICROSCOPIC (NOT AT [facility])    Imaging Review No results found. I have personally reviewed and evaluated these images and lab results as part of my medical decision-making.   EKG Interpretation None      MDM   Final diagnoses:  None    X-rays reveal a left femoral neck fracture which is nondisplaced. I've discussed this finding with Dr. Rolena Infante from orthopedics who will address the hip fracture, but would like the patient admitted to the medicine service. I've spoken with Dr. Coralyn Pear who agrees to admit.  The remainder of the medical workup is essentially unremarkable. There may be some mild dehydration and she was given normal saline for this. She appears to have psychiatric issues and she is extremely anxious. She will likely require consultation with TTS while she is an inpatient.    Veryl Speak, MD 11/08/15 [phone]

## 2015-11-08 NOTE — BH Assessment (Signed)
Assessment Note  Angel Maddox is an 60 y.o. female. Patient was brought to the ED by Angel Maddox because of increased sleeping, confused, anxious, and tearful.  Patient is unable to answer questions appropriately.  She presents with aphasic like symptoms such as struggling to communicate, only blurting out one word.  Patient 's Angel Maddox was at bedside and able to provide history.  Angel Maddox reports the current symptoms has been present for the patient year and three months since the death of her son.  He reports over the past couple weeks symptoms has gotten worse such as sleep all day, not eating, not caring for hygiene, not drinking water, but will take medications.  On Friday of this week, past fell and it was determined today there is possible damage to her hip.     Patient is currently participating with [facility] and last seen a month ago but has a upcoming appointment next week.    This Probation officer consulted with Dr. Darleene Cleaver, it is recommended to refer for inpatient hospitalization once patient is medical cleared.    Diagnosis: Bipolar 1  Past Medical History:  Past Medical History  Diagnosis Date  . Anxiety   . Chronic kidney disease     kidney stone  . H/O hiatal hernia   . Multiple sclerosis ([place])      Had for 15 years  . Tremors of nervous system   . Depression   . Bipolar 1 disorder [facility])     Past Surgical History  Procedure Laterality Date  . Tubal ligation      Family History: History reviewed. No pertinent family history.  Social History:  reports that she has never smoked. She has never used smokeless tobacco. She reports that she drinks alcohol. She reports that she does not use illicit drugs.  Additional Social History:  Alcohol / Drug Use Pain Medications: see chart Prescriptions: see chart Over the Counter: see chart History of alcohol / drug use?: No history of alcohol / drug abuse  CIWA: CIWA-Ar BP: (!) 164/122 mmHg Pulse Rate: 100 COWS:     Allergies: No Known Allergies  Home Medications:  (Not in a hospital admission)  OB/GYN Status:  No LMP recorded. Patient is postmenopausal.  General Assessment Data Location of Assessment: [facility] TTS Assessment: In system Is this a Tele or Face-to-Face Assessment?: Face-to-Face Is this an Initial Assessment or a Re-assessment for this encounter?: Initial Assessment Marital status: Married [facility] name: unable to assess Is patient pregnant?: No Pregnancy Status: No Living Arrangements: Spouse/significant other Can pt return to current living arrangement?: Yes Admission Status: Voluntary Is patient capable of signing voluntary admission?: Yes Referral Source: Self/Family/Friend Insurance type: [facility]     Crisis Care Plan Living Arrangements: Spouse/significant other Name of Psychiatrist: [place] Name of Therapist: [facility]  Education Status Is patient currently in school?: No Highest grade of school patient has completed: unable to assess  Risk to self with the past 6 months Suicidal Ideation:  (unable to assess`) Has patient been a risk to self within the past 6 months prior to admission? : No (unable to assess) Suicidal Intent: No (unable to assess) Has patient had any suicidal intent within the past 6 months prior to admission? :  (unable to assess) Is patient at risk for suicide?:  (unable to assess) Suicidal Plan?:  (unable to assess) Has patient had any suicidal plan within the past 6 months prior to admission? :  (unable to assess) Access to Means:  (unable to assess)  What has been your use of drugs/alcohol within the last 12 months?:  (unable to assess) Previous Attempts/Gestures:  (unable to assess) How many times?:  (unable to assess) Other Self Harm Risks:  (unable to assess) Triggers for Past Attempts:  (unable to assess) Intentional Self Injurious Behavior:  (unable to assess) Family Suicide History:  (unable to assess) Recent stressful life event(s):   (unable to assess) Persecutory voices/beliefs?:  (unable to assess) Depression:  (unable to assess) Depression Symptoms:  (unable to assess) Substance abuse history and/or treatment for substance abuse?:  (unable to assess) Suicide prevention information given to non-admitted patients:  (unable to assess)  Risk to Others within the past 6 months Homicidal Ideation:  (unable to assess) Does patient have any lifetime risk of violence toward others beyond the six months prior to admission? :  (unable to assess) Thoughts of Harm to Others:  (unable to assess) Current Homicidal Intent:  (unable to assess) Current Homicidal Plan:  (unable to assess) Access to Homicidal Means:  (unable to assess) Identified Victim:  (unable to assess) History of harm to others?:  (unable to assess) Assessment of Violence:  (unable to assess) Violent Behavior Description:  (unable to assess) Does patient have access to weapons?:  (unable to assess) Criminal Charges Pending?:  (unable to assess) Does patient have a court date:  (unable to assess) Is patient on probation?:  (unable to assess)  Psychosis Hallucinations:  (unable to assess) Delusions:  (unable to assess)  Mental Status Report Appearance/Hygiene: In hospital gown (unable to assess) Eye Contact: Unable to Assess (unable to assess) Motor Activity: Psychomotor retardation, Rigidity, Shuffling (unable to assess) Speech: Aphasic Level of Consciousness: Crying, Restless, Unresponsive (To pain or command) Mood: Anxious Affect: Constricted, Frightened Anxiety Level: Severe Thought Processes: Thought Blocking Judgement: Impaired Orientation: Not oriented Obsessive Compulsive Thoughts/Behaviors: Unable to Assess  Cognitive Functioning Concentration: Unable to Assess Memory: Unable to Assess IQ:  (unable to assess) Insight: Unable to Assess Impulse Control: Unable to Assess Appetite: Poor (Angel Maddox reports not eating at all since a week  ago) Weight Loss:  (unable to assess) Weight Gain:  (unable to assess) Sleep: Increased (Angel Maddox reports sleeping 24 hours daily in last couple weeks) Total Hours of Sleep: 24 Vegetative Symptoms: Staying in bed, Not bathing, Decreased grooming (Per Angel Maddox pt not caring for self)  ADLScreening [facility] Assessment Services) Patient's cognitive ability adequate to safely complete daily activities?:  (unable to assess) Patient able to express need for assistance with ADLs?: No Independently performs ADLs?: No  Prior Inpatient Therapy Prior Inpatient Therapy:  (unable to assess)  Prior Outpatient Therapy Prior Outpatient Therapy: Yes (unable to assess) Prior Therapy Dates: currently Prior Therapy Facilty/Provider(s): [facility] Reason for Treatment: Bipolar Does patient have an ACCT team?: Unknown Does patient have Intensive In-House Services?  : Unknown Does patient have [facility] services? : Unknown Does patient have P4CC services?: Unknown  ADL Screening (condition at time of admission) Patient's cognitive ability adequate to safely complete daily activities?:  (unable to assess) Patient able to express need for assistance with ADLs?: No Independently performs ADLs?: No       Abuse/Neglect Assessment (Assessment to be complete while patient is alone) Physical Abuse:  (unable to assess) Verbal Abuse:  (unable to assess) Sexual Abuse:  (unable to assess) Exploitation of patient/patient's resources:  (unable to assess) Self-Neglect: Yes, present (Comment) (Angel Maddox reports pt is sleeping 24 hours, not eating or drinking ) Values / Beliefs Cultural Requests During Hospitalization:  (unable to assess) Spiritual Requests  During Hospitalization:  (unable to assess) Consults Spiritual Care Consult Needed:  (unable to assess) Social Work Consult Needed:  (unable to assess) Regulatory affairs officer (For Healthcare) Does patient have an advance directive?: No Would patient like information on  creating an advanced directive?: No - patient declined information    Additional Information 1:1 In Past 12 Months?:  (unable to assess) CIRT Risk:  (unable to assess) Elopement Risk:  (unable to assess) Does patient have medical clearance?: No     Disposition:  Disposition Initial Assessment Completed for this Encounter: Yes Disposition of Patient: Inpatient treatment program Type of inpatient treatment program: Adult  On Site Evaluation by:   Reviewed with Physician:    Chesley Noon A 11/08/2015 11:43 AM

## 2015-11-08 NOTE — Progress Notes (Signed)
Chaplain visited in response to a [place] placed in [name] chart at [place] on 14 May. [name] was in therapy. Please page a chaplain when it is more convenient to visit. [place] remains active.  Angel Maddox. Angel Maddox, [place]

## 2015-11-09 ENCOUNTER — Inpatient Hospital Stay (HOSPITAL_COMMUNITY): Payer: Medicare Other

## 2015-11-09 ENCOUNTER — Inpatient Hospital Stay (HOSPITAL_COMMUNITY): Payer: Medicare Other | Admitting: Anesthesiology

## 2015-11-09 ENCOUNTER — Encounter (HOSPITAL_COMMUNITY)
Admission: EM | Disposition: A | Discharge status: Skilled Nursing Facility | Payer: Self-pay | Source: Home / Self Care | Attending: Internal Medicine

## 2015-11-09 ENCOUNTER — Encounter (HOSPITAL_COMMUNITY): Payer: Self-pay | Admitting: Certified Registered"

## 2015-11-09 DIAGNOSIS — E43 Unspecified severe protein-calorie malnutrition: Secondary | ICD-10-CM | POA: Insufficient documentation

## 2015-11-09 DIAGNOSIS — S72002A Fracture of unspecified part of neck of left femur, initial encounter for closed fracture: Secondary | ICD-10-CM | POA: Diagnosis present

## 2015-11-09 DIAGNOSIS — W19XXXD Unspecified fall, subsequent encounter: Secondary | ICD-10-CM

## 2015-11-09 DIAGNOSIS — F319 Bipolar disorder, unspecified: Secondary | ICD-10-CM

## 2015-11-09 DIAGNOSIS — N179 Acute kidney failure, unspecified: Secondary | ICD-10-CM

## 2015-11-09 DIAGNOSIS — S72002K Fracture of unspecified part of neck of left femur, subsequent encounter for closed fracture with nonunion: Secondary | ICD-10-CM

## 2015-11-09 HISTORY — PX: HIP PINNING,CANNULATED: SHX1758

## 2015-11-09 LAB — BASIC METABOLIC PANEL
Anion gap: 14 (ref 5–15)
BUN: 13 mg/dL (ref 6–20)
CALCIUM: 8.9 mg/dL (ref 8.9–10.3)
CO2: 18 mmol/L — ABNORMAL LOW (ref 22–32)
CREATININE: 0.79 mg/dL (ref 0.44–1.00)
Chloride: 107 mmol/L (ref 101–111)
GFR calc Af Amer: >60 mL/min (ref >60)
Glucose, Bld: 71 mg/dL (ref 65–99)
POTASSIUM: 3.5 mmol/L (ref 3.5–5.1)
SODIUM: 139 mmol/L (ref 135–145)

## 2015-11-09 LAB — CBC
HEMATOCRIT: 37.4 % (ref 36.0–46.0)
Hemoglobin: 12.6 g/dL (ref 12.0–15.0)
MCH: 31.2 pg (ref 26.0–34.0)
MCHC: 33.7 g/dL (ref 30.0–36.0)
MCV: 92.6 fL (ref 78.0–100.0)
PLATELETS: 164 10*3/uL (ref 150–400)
RBC: 4.04 MIL/uL (ref 3.87–5.11)
RDW: 12.7 % (ref 11.5–15.5)
WBC: 8.2 10*3/uL (ref 4.0–10.5)

## 2015-11-09 LAB — GLUCOSE, CAPILLARY: Glucose-Capillary: 78 mg/dL (ref 65–99)

## 2015-11-09 LAB — TYPE AND SCREEN
ABO/RH(D): A POS
Antibody Screen: NEGATIVE

## 2015-11-09 LAB — TSH: TSH: 2.486 u[IU]/mL (ref 0.350–4.500)

## 2015-11-09 LAB — ABO/RH: ABO/RH(D): A POS

## 2015-11-09 LAB — MRSA PCR SCREENING: MRSA BY PCR: NEGATIVE

## 2015-11-09 LAB — VITAMIN B12: Vitamin B-12: 574 pg/mL (ref 180–914)

## 2015-11-09 SURGERY — FIXATION, FEMUR, NECK, PERCUTANEOUS, USING SCREW
Anesthesia: General | Site: Hip | Laterality: Left

## 2015-11-09 MED ORDER — PROPOFOL 10 MG/ML IV BOLUS
INTRAVENOUS | Status: DC | PRN
  Administered 2015-11-09: 150 mg via INTRAVENOUS

## 2015-11-09 MED ORDER — MENTHOL 3 MG MT LOZG: 1.0000 | LOZENGE | OROMUCOSAL | Status: DC | PRN

## 2015-11-09 MED ORDER — ROCURONIUM BROMIDE 100 MG/10ML IV SOLN
INTRAVENOUS | Status: DC | PRN
  Administered 2015-11-09: 30 mg via INTRAVENOUS

## 2015-11-09 MED ORDER — EPHEDRINE SULFATE-NACL 50-0.9 MG/10ML-% IV SOSY
PREFILLED_SYRINGE | INTRAVENOUS | Status: DC | PRN
  Administered 2015-11-09: 10 mg via INTRAVENOUS

## 2015-11-09 MED ORDER — HALOPERIDOL LACTATE 5 MG/ML IJ SOLN
INTRAMUSCULAR | Status: AC
  Filled 2015-11-09: qty 1

## 2015-11-09 MED ORDER — ONDANSETRON HCL 4 MG PO TABS: 4.0000 mg | ORAL_TABLET | Freq: Four times a day (QID) | ORAL | Status: DC | PRN

## 2015-11-09 MED ORDER — METOCLOPRAMIDE HCL 5 MG/ML IJ SOLN
5.0000 mg | Freq: Three times a day (TID) | INTRAMUSCULAR | Status: DC | PRN
  Administered 2015-11-10: 10 mg via INTRAVENOUS
  Filled 2015-11-09: qty 2

## 2015-11-09 MED ORDER — FENTANYL CITRATE (PF) 100 MCG/2ML IJ SOLN
INTRAMUSCULAR | Status: AC
  Filled 2015-11-09: qty 4

## 2015-11-09 MED ORDER — METHOCARBAMOL 1000 MG/10ML IJ SOLN
500.0000 mg | Freq: Four times a day (QID) | INTRAMUSCULAR | Status: DC | PRN
  Administered 2015-11-09: 500 mg via INTRAVENOUS
  Filled 2015-11-09 (×2): qty 5

## 2015-11-09 MED ORDER — GUAIFENESIN-DM 100-10 MG/5ML PO SYRP
5.0000 mL | ORAL_SOLUTION | ORAL | Status: DC | PRN
  Administered 2015-11-09: 5 mL via ORAL
  Filled 2015-11-09: qty 10

## 2015-11-09 MED ORDER — EPHEDRINE SULFATE 50 MG/ML IJ SOLN
INTRAMUSCULAR | Status: AC
  Filled 2015-11-09: qty 1

## 2015-11-09 MED ORDER — LABETALOL HCL 5 MG/ML IV SOLN
5.0000 mg | Freq: Once | INTRAVENOUS | Status: AC
  Administered 2015-11-09: 5 mg via INTRAVENOUS
  Filled 2015-11-09: qty 4

## 2015-11-09 MED ORDER — PHENOL 1.4 % MT LIQD: 1.0000 | OROMUCOSAL | Status: DC | PRN

## 2015-11-09 MED ORDER — HALOPERIDOL LACTATE 5 MG/ML IJ SOLN
2.0000 mg | Freq: Once | INTRAMUSCULAR | Status: AC
  Administered 2015-11-09: 2 mg via INTRAVENOUS

## 2015-11-09 MED ORDER — LIDOCAINE HCL (PF) 2 % IJ SOLN
INTRAMUSCULAR | Status: DC | PRN
Start: 2015-11-09 — End: 2015-11-09
  Administered 2015-11-09: 30 mg via INTRADERMAL

## 2015-11-09 MED ORDER — METHOCARBAMOL 500 MG PO TABS
500.0000 mg | ORAL_TABLET | Freq: Four times a day (QID) | ORAL | Status: DC | PRN
  Administered 2015-11-10 – 2015-11-11 (×2): 500 mg via ORAL
  Filled 2015-11-09 (×2): qty 1

## 2015-11-09 MED ORDER — SENNA 8.6 MG PO TABS
1.0000 | ORAL_TABLET | Freq: Two times a day (BID) | ORAL | Status: DC
Start: 2015-11-09 — End: 2015-11-11
  Administered 2015-11-09: 8.6 mg via ORAL
  Filled 2015-11-09 (×4): qty 1

## 2015-11-09 MED ORDER — SODIUM CHLORIDE 0.9 % IJ SOLN
INTRAMUSCULAR | Status: AC
  Filled 2015-11-09: qty 10

## 2015-11-09 MED ORDER — ONDANSETRON HCL 4 MG/2ML IJ SOLN
INTRAMUSCULAR | Status: DC | PRN
  Administered 2015-11-09: 4 mg via INTRAVENOUS

## 2015-11-09 MED ORDER — ACETAMINOPHEN 650 MG RE SUPP: 650.0000 mg | Freq: Four times a day (QID) | RECTAL | Status: DC | PRN

## 2015-11-09 MED ORDER — MAGNESIUM CITRATE PO SOLN: 1.0000 | Freq: Once | ORAL | Status: DC | PRN

## 2015-11-09 MED ORDER — ACETAMINOPHEN 325 MG PO TABS
650.0000 mg | ORAL_TABLET | Freq: Four times a day (QID) | ORAL | Status: DC | PRN
  Administered 2015-11-10: 650 mg via ORAL
  Filled 2015-11-09: qty 2

## 2015-11-09 MED ORDER — LACTATED RINGERS IV SOLN: INTRAVENOUS | Status: DC

## 2015-11-09 MED ORDER — DEXAMETHASONE SODIUM PHOSPHATE 10 MG/ML IJ SOLN
INTRAMUSCULAR | Status: AC
  Filled 2015-11-09: qty 1

## 2015-11-09 MED ORDER — MIDAZOLAM HCL 5 MG/5ML IJ SOLN
INTRAMUSCULAR | Status: DC | PRN
  Administered 2015-11-09: 2 mg via INTRAVENOUS

## 2015-11-09 MED ORDER — ISOPROPYL ALCOHOL 70 % SOLN
Status: AC
  Filled 2015-11-09: qty 480

## 2015-11-09 MED ORDER — SUGAMMADEX SODIUM 200 MG/2ML IV SOLN
INTRAVENOUS | Status: AC
  Filled 2015-11-09: qty 2

## 2015-11-09 MED ORDER — HYDROCODONE-ACETAMINOPHEN 5-325 MG PO TABS
1.0000 | ORAL_TABLET | Freq: Four times a day (QID) | ORAL | Status: DC | PRN
  Administered 2015-11-10: 1 via ORAL
  Administered 2015-11-10: 2 via ORAL
  Administered 2015-11-10 – 2015-11-11 (×3): 1 via ORAL
  Filled 2015-11-09: qty 2
  Filled 2015-11-09 (×4): qty 1

## 2015-11-09 MED ORDER — ENOXAPARIN SODIUM 40 MG/0.4ML ~~LOC~~ SOLN
40.0000 mg | SUBCUTANEOUS | Status: DC
  Administered 2015-11-10 – 2015-11-11 (×2): 40 mg via SUBCUTANEOUS
  Filled 2015-11-09 (×2): qty 0.4

## 2015-11-09 MED ORDER — ROCURONIUM BROMIDE 50 MG/5ML IV SOLN
INTRAVENOUS | Status: AC
  Filled 2015-11-09: qty 1

## 2015-11-09 MED ORDER — PHENYLEPHRINE HCL 10 MG/ML IJ SOLN
INTRAMUSCULAR | Status: DC | PRN
  Administered 2015-11-09 (×3): 80 ug via INTRAVENOUS

## 2015-11-09 MED ORDER — ALCOHOL, RUBBING 70 % SOLN
Status: DC | PRN
Start: 2015-11-09 — End: 2015-11-09
  Administered 2015-11-09: 30 mL via TOPICAL

## 2015-11-09 MED ORDER — HYDROMORPHONE HCL 1 MG/ML IJ SOLN
0.2500 mg | INTRAMUSCULAR | Status: DC | PRN
  Administered 2015-11-09 (×2): 0.5 mg via INTRAVENOUS

## 2015-11-09 MED ORDER — HYDROMORPHONE HCL 1 MG/ML IJ SOLN
INTRAMUSCULAR | Status: AC
  Filled 2015-11-09: qty 1

## 2015-11-09 MED ORDER — METOCLOPRAMIDE HCL 5 MG PO TABS: 5.0000 mg | ORAL_TABLET | Freq: Three times a day (TID) | ORAL | Status: DC | PRN

## 2015-11-09 MED ORDER — ONDANSETRON HCL 4 MG/2ML IJ SOLN
4.0000 mg | Freq: Four times a day (QID) | INTRAMUSCULAR | Status: DC | PRN
  Administered 2015-11-10: 4 mg via INTRAVENOUS
  Filled 2015-11-09: qty 2

## 2015-11-09 MED ORDER — ONDANSETRON HCL 4 MG/2ML IJ SOLN
INTRAMUSCULAR | Status: AC
  Filled 2015-11-09: qty 2

## 2015-11-09 MED ORDER — FENTANYL CITRATE (PF) 100 MCG/2ML IJ SOLN
INTRAMUSCULAR | Status: DC | PRN
  Administered 2015-11-09 (×4): 50 ug via INTRAVENOUS

## 2015-11-09 MED ORDER — DEXAMETHASONE SODIUM PHOSPHATE 10 MG/ML IJ SOLN
INTRAMUSCULAR | Status: DC | PRN
  Administered 2015-11-09: 10 mg via INTRAVENOUS

## 2015-11-09 MED ORDER — CEFAZOLIN SODIUM-DEXTROSE 2-4 GM/100ML-% IV SOLN
2.0000 g | Freq: Four times a day (QID) | INTRAVENOUS | Status: AC
  Administered 2015-11-09 – 2015-11-10 (×2): 2 g via INTRAVENOUS
  Filled 2015-11-09 (×3): qty 100

## 2015-11-09 MED ORDER — DOCUSATE SODIUM 100 MG PO CAPS
100.0000 mg | ORAL_CAPSULE | Freq: Two times a day (BID) | ORAL | Status: DC
  Administered 2015-11-09 – 2015-11-10 (×2): 100 mg via ORAL
  Filled 2015-11-09 (×4): qty 1

## 2015-11-09 MED ORDER — HALOPERIDOL LACTATE 5 MG/ML IJ SOLN
1.0000 mg | Freq: Once | INTRAMUSCULAR | Status: AC
  Administered 2015-11-09: 1 mg via INTRAVENOUS
  Filled 2015-11-09: qty 1

## 2015-11-09 MED ORDER — HYDRALAZINE HCL 20 MG/ML IJ SOLN
5.0000 mg | Freq: Once | INTRAMUSCULAR | Status: AC
  Administered 2015-11-09: 5 mg via INTRAVENOUS
  Filled 2015-11-09: qty 1

## 2015-11-09 MED ORDER — PROPOFOL 10 MG/ML IV BOLUS
INTRAVENOUS | Status: AC
  Filled 2015-11-09: qty 20

## 2015-11-09 MED ORDER — SUCCINYLCHOLINE CHLORIDE 20 MG/ML IJ SOLN
INTRAMUSCULAR | Status: DC | PRN
  Administered 2015-11-09: 100 mg via INTRAVENOUS

## 2015-11-09 MED ORDER — SUGAMMADEX SODIUM 200 MG/2ML IV SOLN
INTRAVENOUS | Status: DC | PRN
  Administered 2015-11-09: 150 mg via INTRAVENOUS

## 2015-11-09 MED ORDER — MIDAZOLAM HCL 2 MG/2ML IJ SOLN
INTRAMUSCULAR | Status: AC
  Filled 2015-11-09: qty 2

## 2015-11-09 MED ORDER — LACTATED RINGERS IV SOLN
INTRAVENOUS | Status: DC | PRN
  Administered 2015-11-09: 15:00:00 via INTRAVENOUS

## 2015-11-09 MED ORDER — LIDOCAINE HCL (CARDIAC) 20 MG/ML IV SOLN
INTRAVENOUS | Status: AC
  Filled 2015-11-09: qty 5

## 2015-11-09 MED ORDER — ONDANSETRON HCL 4 MG/2ML IJ SOLN: 4.0000 mg | Freq: Once | INTRAMUSCULAR | Status: DC | PRN

## 2015-11-09 MED ORDER — CEFAZOLIN SODIUM-DEXTROSE 2-4 GM/100ML-% IV SOLN
INTRAVENOUS | Status: AC
  Filled 2015-11-09: qty 100

## 2015-11-09 MED ORDER — SODIUM CHLORIDE 0.9 % IR SOLN
Status: DC | PRN
  Administered 2015-11-09: 1000 mL

## 2015-11-09 SURGICAL SUPPLY — 41 items
BAG SPEC THK2 15X12 ZIP CLS (MISCELLANEOUS)
BAG ZIPLOCK 12X15 (MISCELLANEOUS) IMPLANT
BIT DRILL 4.8X200 CANN (BIT) ×1 IMPLANT
CHLORAPREP W/TINT 26ML (MISCELLANEOUS) ×2 IMPLANT
COVER PERINEAL POST (MISCELLANEOUS) ×2 IMPLANT
DRAPE C-ARM 42X120 X-RAY (DRAPES) ×2 IMPLANT
DRAPE C-ARMOR (DRAPES) ×2 IMPLANT
DRAPE SHEET LG 3/4 BI-LAMINATE (DRAPES) ×2 IMPLANT
DRAPE STERI IOBAN 125X83 (DRAPES) ×2 IMPLANT
DRAPE U-SHAPE 47X51 STRL (DRAPES) ×4 IMPLANT
DRSG AQUACEL AG ADV 3.5X 6 (GAUZE/BANDAGES/DRESSINGS) ×1 IMPLANT
DRSG MEPILEX BORDER 4X4 (GAUZE/BANDAGES/DRESSINGS) ×4 IMPLANT
DRSG MEPILEX BORDER 4X8 (GAUZE/BANDAGES/DRESSINGS) IMPLANT
ELECT BLADE TIP CTD 4 INCH (ELECTRODE) IMPLANT
FACESHIELD WRAPAROUND (MASK) ×2 IMPLANT
FACESHIELD WRAPAROUND OR TEAM (MASK) ×1 IMPLANT
GAUZE SPONGE 4X4 12PLY STRL (GAUZE/BANDAGES/DRESSINGS) ×2 IMPLANT
GLOVE BIO SURGEON STRL SZ8.5 (GLOVE) ×4 IMPLANT
GLOVE BIOGEL PI IND STRL 7.5 (GLOVE) IMPLANT
GLOVE BIOGEL PI IND STRL 8.5 (GLOVE) ×1 IMPLANT
GLOVE BIOGEL PI INDICATOR 7.5 (GLOVE) ×2
GLOVE BIOGEL PI INDICATOR 8.5 (GLOVE) ×1
GLOVE SURG SS PI 7.5 STRL IVOR (GLOVE) ×1 IMPLANT
GOWN SPEC L3 XXLG W/TWL (GOWN DISPOSABLE) ×2 IMPLANT
GOWN STRL REUS W/ TWL XL LVL3 (GOWN DISPOSABLE) IMPLANT
GOWN STRL REUS W/TWL XL LVL3 (GOWN DISPOSABLE) ×2
KIT BASIN OR (CUSTOM PROCEDURE TRAY) ×2 IMPLANT
LIQUID BAND (GAUZE/BANDAGES/DRESSINGS) ×3 IMPLANT
MANIFOLD NEPTUNE II (INSTRUMENTS) ×2 IMPLANT
MARKER SKIN DUAL TIP RULER LAB (MISCELLANEOUS) ×2 IMPLANT
PACK TOTAL JOINT WL LF (CUSTOM PROCEDURE TRAY) ×2 IMPLANT
PIN GUIDE DRILL TIP 2.8X300 (DRILL) ×8 IMPLANT
SCREW CANN 8.0X70 16 THRD (Screw) IMPLANT
SCREW CANN 8.0X85 HIP (Screw) ×2 IMPLANT
SCREW CANNULATED 8.0X70MM (Screw) IMPLANT
SCREW CANNULATED 8.0X75MM (Screw) ×2 IMPLANT
SUT MNCRL AB 3-0 PS2 18 (SUTURE) ×2 IMPLANT
SUT VIC AB 1 CT1 36 (SUTURE) ×2 IMPLANT
WASHER 8.0 (Orthopedic Implant) IMPLANT
WASHER CANN FLAT 8 (Orthopedic Implant) IMPLANT
YANKAUER SUCT BULB TIP NO VENT (SUCTIONS) IMPLANT

## 2015-11-09 NOTE — Progress Notes (Signed)
PT CALMED DOWN BRIEFLY WITH THE ADMINISTRATION OF THE 2 MG HALDOL, BUT NOW SHE IS TRYING TO GET OOB AGAIN.

## 2015-11-09 NOTE — Anesthesia Preprocedure Evaluation (Addendum)
Anesthesia Evaluation  Patient identified by MRN, date of birth, ID band Patient awake    Reviewed: Allergy & Precautions, NPO status , Patient's Chart, lab work & pertinent test results  History of Anesthesia Complications Negative for: history of anesthetic complications  Airway Mallampati: III  TM Distance: >3 FB Neck ROM: Full    Dental no notable dental hx. (+) Dental Advisory Given   Pulmonary neg pulmonary ROS,    Pulmonary exam normal breath sounds clear to auscultation       Cardiovascular negative cardio ROS Normal cardiovascular exam Rhythm:Regular Rate:Normal     Neuro/Psych PSYCHIATRIC DISORDERS Anxiety Depression Bipolar Disorder Hx of MS     GI/Hepatic negative GI ROS, Neg liver ROS,   Endo/Other  negative endocrine ROS  Renal/GU Renal disease  negative genitourinary   Musculoskeletal negative musculoskeletal ROS (+)   Abdominal   Peds negative pediatric ROS (+)  Hematology negative hematology ROS (+)   Anesthesia Other Findings   Reproductive/Obstetrics negative OB ROS                            Anesthesia Physical Anesthesia Plan  ASA: III  Anesthesia Plan: General   Post-op Pain Management:    Induction: Intravenous  Airway Management Planned: Oral ETT  Additional Equipment:   Intra-op Plan:   Post-operative Plan: Extubation in OR  Informed Consent: I have reviewed the patients History and Physical, chart, labs and discussed the procedure including the risks, benefits and alternatives for the proposed anesthesia with the patient or authorized representative who has indicated his/her understanding and acceptance.   Dental advisory given  Plan Discussed with: CRNA  Anesthesia Plan Comments: (Patient very emotional. Husband gives history and consent. He reports that since her brother passed last year and her physician has been adjusting her  antidepressants she has been very emotional. The fall has just pushed her into an even more emotional state. She will answer some basic questions such as her NPO status and allergies but all this was confirmed by her husband. )      Anesthesia Quick Evaluation

## 2015-11-09 NOTE — Progress Notes (Signed)
Initial Nutrition Assessment  DOCUMENTATION CODES:   Severe malnutrition in context of acute illness/injury  INTERVENTION:  - Diet advancement as medically feasible - RD will order/change nutrition supplements versus snacks with diet advancement (Ensure Enlive is currently ordered BID).  NUTRITION DIAGNOSIS:   Inadequate oral intake related to inability to eat as evidenced by NPO status.  GOAL:   Patient will meet greater than or equal to 90% of their needs  MONITOR:   Diet advancement, Weight trends, Labs, I & O's  REASON FOR ASSESSMENT:   Malnutrition Screening Tool  ASSESSMENT:   60 y.o. female with medical history significant of major depression and bipolar disorder who presents to the emergency room with complaints of left hip pain. Family members reporting that she has had severe depression since the passing away of her son over a year ago. Husband states that she has spent the majority of her time in bed with little mobility, having decreased by mouth intake, "sleeping all day long." She has experienced severe depressive symptoms, tearfulness, having feelings of hopelessness. Last Friday she got up to use the restroom having a fall in the hallway. Her husband helped her get back into bed and had not been out of bed since then, which according to her husband was not far from baseline. They decided to come into the emergency room today. Mrs. Pruette does not participate much in her history, majority of history provided by her husband was at bedside.  Pt seen for MST. BMI indicates normal weight. Pt is NPO for L hip pinning today d/t fall with associated L hip fx. Pt unable to provide information or be interactive with RD and all information was provided by husband, who was at bedside.   Pt typically had a good appetite until 1 week ago when she stopped eating, refused foods and drinks. She often drank several cups of coffee and 2 bottles of Dr. Beverly Gust and starting 1 week ago  refused these items as well. Husband reports no chewing/swallowing issues. He states that 6 months ago pt had a doctor's appointment and at that time she weighed 148-150 lbs; he states 10 lb weight loss since that time. Per chart review, pt has lost 7 lbs (5% body weight) in the past 2 months which is not significant for time frame. Unable to complete physical assessment with respect to pt's comfort as she was very agitated during visit, but able to visualize at least moderate muscle wasting to upper body.   Pt unable to meet needs at this time. Husband is hopeful pt will begin to eat following surgery but feels pt may not; will monitor for diet advancement and order supplements and/or snacks per pt preference with advancement. Ensure Jeanne Ivan is currently ordered BID. Medications reviewed. IVF: NS @ 100 mL/hr. Labs reviewed.    Diet Order:  Diet NPO time specified Except for: Sips with Meds  Skin:  Reviewed, no issues  Last BM:  5/14  Height:   Ht Readings from Last 1 Encounters:  11/08/15 5\' 10"  (1.778 m)    Weight:   Wt Readings from Last 1 Encounters:  11/08/15 140 lb (63.504 kg)    Ideal Body Weight:  68.18 kg (kg)  BMI:  Body mass index is 20.09 kg/(m^2).  Estimated Nutritional Needs:   Kcal:  1600-1800  Protein:  65-75 grams  Fluid:  1.6-1.8 L/day  EDUCATION NEEDS:   No education needs identified at this time    Jarome Matin, RD, LDN Inpatient Clinical Dietitian  Pager # [phone] After hours/weekend pager # [phone]  

## 2015-11-09 NOTE — Discharge Instructions (Signed)
°Dr. Darold Miley °Adult Hip & Knee Specialist °[facility] °[address] °[place], [place] [address] °[phone] ° ° °POSTOPERATIVE DIRECTIONS ° ° ° °Hip Rehabilitation, Guidelines Following Surgery  ° °WEIGHT BEARING °Partial weight bearing with assist device as directed.  50% with walker ° ° °HOME CARE INSTRUCTIONS  °Remove items at home which could result in a fall. This includes throw rugs or furniture in walking pathways.  °Continue medications as instructed at time of discharge. °· You may have some home medications which will be placed on hold until you complete the course of blood thinner medication. °· 4 days after discharge, you may start showering. No tub baths or soaking your incisions. °Do not put on socks or shoes without following the instructions of your caregivers.   °Sit on chairs with arms. Use the chair arms to help push yourself up when arising.  °Arrange for the use of a toilet seat elevator so you are not sitting low.  °· Walk with walker as instructed.  °You may resume a sexual relationship in one month or when given the OK by your caregiver.  °Use walker as long as suggested by your caregivers.  °Avoid periods of inactivity such as sitting longer than an hour when not asleep. This helps prevent blood clots.  °You may return to work once you are cleared by your surgeon.  °Do not drive a car for 6 weeks or until released by your surgeon.  °Do not drive while taking narcotics.  °Wear elastic stockings for two weeks following surgery during the day but you may remove then at night.  °Make sure you keep all of your appointments after your operation with all of your doctors and caregivers. You should call the office at the above phone number and make an appointment for approximately two weeks after the date of your surgery. °Please pick up a stool softener and laxative for home use as long as you are requiring pain medications. °· ICE to the affected hip every three  hours for 30 minutes at a time and then as needed for pain and swelling. Continue to use ice on the hip for pain and swelling from surgery. You may notice swelling that will progress down to the foot and ankle.  This is normal after surgery.  Elevate the leg when you are not up walking on it.   °It is important for you to complete the blood thinner medication as prescribed by your doctor. °· Continue to use the breathing machine which will help keep your temperature down.  It is common for your temperature to cycle up and down following surgery, especially at night when you are not up moving around and exerting yourself.  The breathing machine keeps your lungs expanded and your temperature down. ° °RANGE OF MOTION AND STRENGTHENING EXERCISES  °These exercises are designed to help you keep full movement of your hip joint. Follow your caregiver's or physical therapist's instructions. Perform all exercises about fifteen times, three times per day or as directed. Exercise both hips, even if you have had only one joint replacement. These exercises can be done on a training (exercise) mat, on the floor, on a table or on a bed. Use whatever works the best and is most comfortable for you. Use music or television while you are exercising so that the exercises are a pleasant break in your day. This will make your life better with the exercises acting as a break in routine you can look   forward to.  °Lying on your back, slowly slide your foot toward your buttocks, raising your knee up off the floor. Then slowly slide your foot back down until your leg is straight again.  °Lying on your back spread your legs as far apart as you can without causing discomfort.  °Lying on your side, raise your upper leg and foot straight up from the floor as far as is comfortable. Slowly lower the leg and repeat.  °Lying on your back, tighten up the muscle in the front of your thigh (quadriceps muscles). You can do this by keeping your leg  straight and trying to raise your heel off the floor. This helps strengthen the largest muscle supporting your knee.  °Lying on your back, tighten up the muscles of your buttocks both with the legs straight and with the knee bent at a comfortable angle while keeping your heel on the floor.  ° °SKILLED REHAB INSTRUCTIONS: °If the patient is transferred to a skilled rehab facility following release from the hospital, a list of the current medications will be sent to the facility for the patient to continue.  When discharged from the skilled rehab facility, please have the facility set up the patient's Home Health Physical Therapy prior to being released. Also, the skilled facility will be responsible for providing the patient with their medications at time of release from the facility to include their pain medication and their blood thinner medication. If the patient is still at the rehab facility at time of the two week follow up appointment, the skilled rehab facility will also need to assist the patient in arranging follow up appointment in our office and any transportation needs. ° °MAKE SURE YOU:  °Understand these instructions.  °Will watch your condition.  °Will get help right away if you are not doing well or get worse. ° °Pick up stool softner and laxative for home use following surgery while on pain medications. °Daily dry dressing changes as needed. °In 4 days, you may remove your dressings and begin taking showers - no tub baths or soaking the incisions. °Continue to use ice for pain and swelling after surgery. °Do not use any lotions or creams on the incision until instructed by your surgeon. ° ° °

## 2015-11-09 NOTE — Progress Notes (Signed)
Asked to assume care by Dr. Rolena Infante. Has valgus impacted left femoral neck fx. Plan for percutaneous pinning of left hip today. Discussed R/B/A with patient and her husband. Cont NPO. Hold chemical DVT ppx for now.  The risks, benefits, and alternatives were discussed with the patient. There are risks associated with the surgery including, but not limited to, problems with anesthesia (death), infection, differences in leg length/angulation/rotation, fracture of bones, loosening or failure of implants, malunion, nonunion, hematoma (blood accumulation) which may require surgical drainage, blood clots, pulmonary embolism, nerve injury (foot drop), and blood vessel injury. The patient understands these risks and elects to proceed.

## 2015-11-09 NOTE — Brief Op Note (Signed)
11/09/2015  5:02 PM  PATIENT:  Angel Maddox  60 y.o. female  PRE-OPERATIVE DIAGNOSIS:  left hip fracture  POST-OPERATIVE DIAGNOSIS:  left hip fracture  PROCEDURE:  Procedure(s): CANNULATED HIP PINNING (Left)  SURGEON:  Surgeon(s) and Role:    * Rod Can, MD - Primary  PHYSICIAN ASSISTANT: none  ASSISTANTS: staff   ANESTHESIA:   general  EBL:  Total I/O In: 646.7 [I.V.:646.7] Out: 825 [Urine:800; Blood:25]  BLOOD ADMINISTERED:none  DRAINS: none   LOCAL MEDICATIONS USED:  NONE  SPECIMEN:  No Specimen  DISPOSITION OF SPECIMEN:  N/A  COUNTS:  YES  TOURNIQUET:  * No tourniquets in log *  DICTATION: .Other Dictation: Dictation Number [id number]  PLAN OF CARE: Admit to inpatient   PATIENT DISPOSITION:  PACU - hemodynamically stable.   Delay start of Pharmacological VTE agent (>24hrs) due to surgical blood loss or risk of bleeding: yes

## 2015-11-09 NOTE — Progress Notes (Signed)
   11/09/15 1200  Clinical Encounter Type  Visited With Patient and family together  Visit Type Initial;Psychological support;Spiritual support;Pre-op  Referral From Chaplain  Consult/Referral To Chaplain  Spiritual Encounters  Spiritual Needs Emotional;Other (Comment) (Pastoral Conversation/Support)  Stress Factors  Patient Stress Factors Health changes;Other (Comment) (Surgery and Coughing Spells)  Family Stress Factors Health changes;Other (Comment) (Patient's Surgery )   I visited with the patient per referral by one of the other Chaplains. The patient was surrounded by family upon my arrival and she was getting ready to have surgery. The patient had a coughing spell when she tried to talk. One of her family members went over to her and told her to stop. The patient then was able to stop.  They requested a visit after her surgery.  Please contact Spiritual for further assistance.    [place] M.Div.

## 2015-11-09 NOTE — H&P (View-Only) (Signed)
Patient ID: MEGEAN YONEMURA MRN: [id number] DOB/AGE: [birth year] 60 y.o.  Admit date: 11/08/2015  Admission Diagnoses:  Principal Problem:   Closed left hip fracture [facility]) Active Problems:   Fall at home   Femoral neck fracture, left, closed, initial encounter   HPI: Pleasant 60 year old female pt with a past medical hx significant for MS and bipolar.  She fell at her home on Friday evening.  She used a walker on Saturday around the house.  Her husband said this morning it was obvious the pt was not improving and he brought her to the ED.    Past Medical History: Past Medical History  Diagnosis Date  . Anxiety   . Chronic kidney disease     kidney stone  . H/O hiatal hernia   . Multiple sclerosis ([place])      Had for 15 years  . Tremors of nervous system   . Depression   . Bipolar 1 disorder [facility])     Surgical History: Past Surgical History  Procedure Laterality Date  . Tubal ligation      Family History: History reviewed. No pertinent family history.  Social History: Social History   Social History  . Marital Status: Married    Spouse Name: N/A  . Number of Children: N/A  . Years of Education: N/A   Occupational History  . Not on file.   Social History Main Topics  . Smoking status: Never Smoker   . Smokeless tobacco: Never Used  . Alcohol Use: Yes     Comment: socially  . Drug Use: No  . Sexual Activity: Not on file   Other Topics Concern  . Not on file   Social History Narrative    Allergies: Review of patient's allergies indicates no known allergies.  Medications: I have reviewed the patient's current medications.  Vital Signs: Patient Vitals for the past 24 hrs:  BP Temp Temp src Pulse Resp SpO2 Height Weight  11/08/15 1421 (!) 170/90 mmHg 97.8 F (36.6 C) Oral 93 (!) 24 99 % - -  11/08/15 1415 (!) 148/110 mmHg - - 93 (!) 22 - - -  11/08/15 1340 (!) 168/118 mmHg 98.9 F (37.2 C) Oral (!) 109 (!) 24 98 % 5\' 10"  (1.778 m) 63.504 kg  (140 lb)  11/08/15 1200 104/60 mmHg - - - 23 - - -  11/08/15 1039 (!) 164/122 mmHg 98.1 F (36.7 C) Oral 100 25 94 % - -  11/08/15 0822 (!) 155/115 mmHg 98.4 F (36.9 C) Oral 110 24 100 % - -    Radiology: Dg Chest 2 View  11/08/2015  CLINICAL DATA:  60 year old female with 2-3 day history of cough. History of fall yesterday, currently unable to walk. Pain in the left hip. Shortness of breath. EXAM: CHEST  2 VIEW COMPARISON:  No priors. FINDINGS: Lung volumes are normal. No consolidative airspace disease. No pleural effusions. No pneumothorax. No pulmonary nodule or mass noted. Pulmonary vasculature and the cardiomediastinal silhouette are within normal limits. IMPRESSION: No radiographic evidence of acute cardiopulmonary disease. Electronically Signed   By: Vinnie Langton M.D.   On: 11/08/2015 10:24   Dg Hip Unilat With Pelvis 2-3 Views Left  11/08/2015  CLINICAL DATA:  60 year old female with history of trauma from a fall yesterday complaining of pain in the left hip today, unable to walk. EXAM: DG HIP (WITH OR WITHOUT PELVIS) 2-3V LEFT COMPARISON:  No priors. FINDINGS: There is an acute transcervical left-sided femoral neck  fracture with mild valgus angulation and mild impaction. Femoral head remains located in the left acetabulum. Bony pelvis appears intact. Right proximal femur as visualized is intact. Mild moderate degenerative changes of osteoarthritis are noted in the hip joints bilaterally. IMPRESSION: 1. Acute transcervical mildly impacted mildly angulated left femoral neck fracture, as above. 2. Mild to moderate osteoarthritis in the hip joints bilaterally. Electronically Signed   By: Vinnie Langton M.D.   On: 11/08/2015 10:29    Labs:  Recent Labs  11/08/15 0839  WBC 11.2*  RBC 4.68  HCT 42.8  PLT 208    Recent Labs  11/08/15 0839  NA 140  K 3.9  CL 102  CO2 19*  BUN 23*  CREATININE 1.26*  GLUCOSE 120*  CALCIUM 10.0   No results for input(s): LABPT, INR in the  last 72 hours.  Review of Systems: ROS  Physical Exam: Neurologically intact ABD soft Sensation intact distally  Severe pain of the left hip  Assessment and Plan: NPO after midnight Dr. Rolena Infante will consult Dr. Lyla Glassing - consider surgical intervention tomorrow Consulted pharmacy - we are restarting her bipolar medications Talked to the nurse - we will alternate the IV Morphine and PO Oxy every 2 hours  Pt may have her regularly prescribed Klonopin tid prn   [place] [phone]

## 2015-11-09 NOTE — Interval H&P Note (Signed)
History and Physical Interval Note:  11/09/2015 3:28 PM  Angel Maddox  has presented today for surgery, with the diagnosis of left hip fracture  The various methods of treatment have been discussed with the patient and family. After consideration of risks, benefits and other options for treatment, the patient has consented to  Procedure(s): CANNULATED HIP PINNING (Left) as a surgical intervention .  The patient's history has been reviewed, patient examined, no change in status, stable for surgery.  I have reviewed the patient's chart and labs.  Questions were answered to the patient's satisfaction.     Angel Maddox, Horald Pollen

## 2015-11-09 NOTE — Transfer of Care (Signed)
Immediate Anesthesia Transfer of Care Note  Patient: Angel Maddox  Procedure(s) Performed: Procedure(s): CANNULATED HIP PINNING (Left)  Patient Location: PACU  Anesthesia Type:General  Level of Consciousness:  sedated, patient cooperative and responds to stimulation  Airway & Oxygen Therapy:Patient Spontanous Breathing and Patient connected to face mask oxgen  Post-op Assessment:  Report given to PACU RN and Post -op Vital signs reviewed and stable  Post vital signs:  Reviewed and stable  Last Vitals:  Filed Vitals:   11/09/15 0644 11/09/15 1300  BP: 170/90 160/89  Pulse:    Temp:    Resp:      Complications: No apparent anesthesia complications

## 2015-11-09 NOTE — Anesthesia Postprocedure Evaluation (Signed)
Anesthesia Post Note  Patient: Angel Maddox  Procedure(s) Performed: Procedure(s) (LRB): CANNULATED HIP PINNING (Left)  Patient location during evaluation: PACU Anesthesia Type: General Level of consciousness: awake and alert Pain management: pain level controlled Vital Signs Assessment: post-procedure vital signs reviewed and stable Respiratory status: spontaneous breathing, nonlabored ventilation, respiratory function stable and patient connected to nasal cannula oxygen Cardiovascular status: blood pressure returned to baseline and stable Postop Assessment: no signs of nausea or vomiting Anesthetic complications: no    Last Vitals:  Filed Vitals:   11/09/15 1740 11/09/15 1745  BP: 164/104 165/100  Pulse: 89 87  Temp:    Resp: 10 16    Last Pain:  Filed Vitals:   11/09/15 1748  PainSc: 5                  Lile Mccurley JENNETTE

## 2015-11-09 NOTE — Consult Note (Signed)
[facility] Consult   Reason for Consult:  Depression and history of bipolar disorder Referring Physician:  Dr. Carles Collet Patient Identification: Angel Maddox MRN:  [id number] Principal Diagnosis: Closed left hip fracture [facility]) Diagnosis:   Patient Active Problem List   Diagnosis Date Noted  . Closed left hip fracture ([place]) [S72.002A] 11/08/2015  . Fall at home [W19.Merril Abbe, O27.741] 11/08/2015  . Femoral neck fracture, left, closed, initial encounter [S72.002A] 11/08/2015  . Bipolar 1 disorder, depressed ([place]) [F31.9] 02/11/2015    Total Time spent with patient: 1 hour  Subjective:   AMNEET CENDEJAS is a 60 y.o. female patient admitted with depression and s/p fall.  HPI:  Angel Maddox is a 60 y.o. Female Admitted to [facility] for close left hip fracture while walking at her home. Psychiatric consultation requested for history of bipolar disorder and recently increased symptoms of depression. Patient reportedly depressed, dysphoric and in acute distress secondary to pain associated with close left hip fracture. Patient fianc/husband is at bedside reported patient has been depressed with the increased periods of crying episodes, isolated, withdrawn, staying in her bed multiple hours every day and not actively participating in the work at home. Patient outpatient psychiatric provider has been tapering off risperidone because it is not working. Patient husband reported that her 16 years old son killed himself with intentional drug overdose about one year ago and also was received outpatient medication management. Patient depression has been increased since then and none of her medication seems to be working at this time. Patient seems to be in agony due to her pain from the hip fracture and trying to sit up in the bed asking for help but could not appropriately respond to most of my questions. Reportedly patient is also suffering with AMS and has been seeing a  specialist from [facility] and now at [facility] near [place] who provides her gabapentin and was given a trial of methylphenidate the past.  Please review the following information for more details: Last Friday she got up to use the restroom having a fall in the hallway. Her husband helped her get back into bed and had not been out of bed since then, which according to her husband was not far from baseline. Angel Maddox does not participate much in her history, majority of history provided by her husband was at bedside.    Past Psychiatric History: bipolar depression and has been receiving outpatient medication management [facility].  Risk to Self: Suicidal Ideation:  (unable to assess`) Suicidal Intent: No (unable to assess) Is patient at risk for suicide?: No Suicidal Plan?:  (unable to assess) Access to Means:  (unable to assess) What has been your use of drugs/alcohol within the last 12 months?:  (unable to assess) How many times?:  (unable to assess) Other Self Harm Risks:  (unable to assess) Triggers for Past Attempts:  (unable to assess) Intentional Self Injurious Behavior:  (unable to assess) Risk to Others: Homicidal Ideation:  (unable to assess) Thoughts of Harm to Others:  (unable to assess) Current Homicidal Intent:  (unable to assess) Current Homicidal Plan:  (unable to assess) Access to Homicidal Means:  (unable to assess) Identified Victim:  (unable to assess) History of harm to others?:  (unable to assess) Assessment of Violence:  (unable to assess) Violent Behavior Description:  (unable to assess) Does patient have access to weapons?:  (unable to assess) Criminal Charges Pending?:  (unable to assess) Does patient have a  court date:  (unable to assess) Prior Inpatient Therapy: Prior Inpatient Therapy:  (unable to assess) Prior Outpatient Therapy: Prior Outpatient Therapy: Yes (unable to assess) Prior Therapy Dates: currently Prior Therapy  Facilty/Provider(s): [facility] Reason for Treatment: Bipolar Does patient have an ACCT team?: Unknown Does patient have Intensive In-House Services?  : Unknown Does patient have [facility] services? : Unknown Does patient have P4[facility] services?: Unknown  Past Medical History:  Past Medical History  Diagnosis Date  . Anxiety   . Chronic kidney disease     kidney stone  . H/O hiatal hernia   . Multiple sclerosis ([place])      Had for 15 years  . Tremors of nervous system   . Depression   . Bipolar 1 disorder [facility])     Past Surgical History  Procedure Laterality Date  . Tubal ligation     Family History: History reviewed. No pertinent family history. Family Psychiatric  History: Patient 52 years old son deceased secondary to intentional drug overdose Social History:  History  Alcohol Use  . Yes    Comment: socially     History  Drug Use No    Social History   Social History  . Marital Status: Married    Spouse Name: N/A  . Number of Children: N/A  . Years of Education: N/A   Social History Main Topics  . Smoking status: Never Smoker   . Smokeless tobacco: Never Used  . Alcohol Use: Yes     Comment: socially  . Drug Use: No  . Sexual Activity: Not Asked   Other Topics Concern  . None   Social History Narrative   Additional Social History:    Allergies:  No Known Allergies  Labs:  Results for orders placed or performed during the hospital encounter of 11/08/15 (from the past 48 hour(s))  Comprehensive metabolic panel     Status: Abnormal   Collection Time: 11/08/15  8:39 AM  Result Value Ref Range   Sodium 140 135 - 145 mmol/L   Potassium 3.9 3.5 - 5.1 mmol/L   Chloride 102 101 - 111 mmol/L   CO2 19 (L) 22 - 32 mmol/L   Glucose, Bld 120 (H) 65 - 99 mg/dL   BUN 23 (H) 6 - 20 mg/dL   Creatinine, Ser 1.26 (H) 0.44 - 1.00 mg/dL   Calcium 10.0 8.9 - 10.3 mg/dL   Total Protein 8.7 (H) 6.5 - 8.1 g/dL   Albumin 5.2 (H) 3.5 - 5.0 g/dL   AST 103 (H) 15 - 41 U/L    ALT 41 14 - 54 U/L   Alkaline Phosphatase 107 38 - 126 U/L   Total Bilirubin 1.5 (H) 0.3 - 1.2 mg/dL   GFR calc non Af Amer 46 (L) >60 mL/min   GFR calc Af Amer 53 (L) >60 mL/min    Comment: (NOTE) The eGFR has been calculated using the CKD EPI equation. This calculation has not been validated in all clinical situations. eGFR's persistently <60 mL/min signify possible Chronic Kidney Disease.    Anion gap 19 (H) 5 - 15  Ethanol     Status: None   Collection Time: 11/08/15  8:39 AM  Result Value Ref Range   Alcohol, Ethyl (B) <5 <5 mg/dL    Comment:        LOWEST DETECTABLE LIMIT FOR SERUM ALCOHOL IS 5 mg/dL FOR MEDICAL PURPOSES ONLY   Salicylate level     Status: None   Collection Time: 11/08/15  8:39  AM  Result Value Ref Range   Salicylate Lvl <5.9 2.8 - 30.0 mg/dL  Acetaminophen level     Status: Abnormal   Collection Time: 11/08/15  8:39 AM  Result Value Ref Range   Acetaminophen (Tylenol), Serum <10 (L) 10 - 30 ug/mL    Comment:        THERAPEUTIC CONCENTRATIONS VARY SIGNIFICANTLY. A RANGE OF 10-30 ug/mL MAY BE AN EFFECTIVE CONCENTRATION FOR MANY PATIENTS. HOWEVER, SOME ARE BEST TREATED AT CONCENTRATIONS OUTSIDE THIS RANGE. ACETAMINOPHEN CONCENTRATIONS >150 ug/mL AT 4 HOURS AFTER INGESTION AND >50 ug/mL AT 12 HOURS AFTER INGESTION ARE OFTEN ASSOCIATED WITH TOXIC REACTIONS.   cbc     Status: Abnormal   Collection Time: 11/08/15  8:39 AM  Result Value Ref Range   WBC 11.2 (H) 4.0 - 10.5 K/uL   RBC 4.68 3.87 - 5.11 MIL/uL   Hemoglobin 14.9 12.0 - 15.0 g/dL   HCT 42.8 36.0 - 46.0 %   MCV 91.5 78.0 - 100.0 fL   MCH 31.8 26.0 - 34.0 pg   MCHC 34.8 30.0 - 36.0 g/dL   RDW 12.5 11.5 - 15.5 %   Platelets 208 150 - 400 K/uL  Protime-INR     Status: None   Collection Time: 11/08/15  1:48 PM  Result Value Ref Range   Prothrombin Time 15.2 11.6 - 15.2 seconds   INR 1.19 0.00 - 1.49  Surgical PCR screen     Status: None   Collection Time: 11/08/15  3:53 PM  Result  Value Ref Range   MRSA, PCR NEGATIVE NEGATIVE   Staphylococcus aureus NEGATIVE NEGATIVE    Comment:        The Xpert SA Assay (FDA approved for NASAL specimens in patients over 69 years of age), is one component of a comprehensive surveillance program.  Test performance has been validated by [facility] for patients greater than or equal to 18 year old. It is not intended to diagnose infection nor to guide or monitor treatment.   MRSA PCR Screening     Status: None   Collection Time: 11/09/15 12:27 AM  Result Value Ref Range   MRSA by PCR NEGATIVE NEGATIVE    Comment:        The GeneXpert MRSA Assay (FDA approved for NASAL specimens only), is one component of a comprehensive MRSA colonization surveillance program. It is not intended to diagnose MRSA infection nor to guide or monitor treatment for MRSA infections.   Basic metabolic panel     Status: Abnormal   Collection Time: 11/09/15  3:57 AM  Result Value Ref Range   Sodium 139 135 - 145 mmol/L   Potassium 3.5 3.5 - 5.1 mmol/L   Chloride 107 101 - 111 mmol/L   CO2 18 (L) 22 - 32 mmol/L   Glucose, Bld 71 65 - 99 mg/dL   BUN 13 6 - 20 mg/dL   Creatinine, Ser 0.79 0.44 - 1.00 mg/dL   Calcium 8.9 8.9 - 10.3 mg/dL   GFR calc non Af Amer >60 >60 mL/min   GFR calc Af Amer >60 >60 mL/min    Comment: (NOTE) The eGFR has been calculated using the CKD EPI equation. This calculation has not been validated in all clinical situations. eGFR's persistently <60 mL/min signify possible Chronic Kidney Disease.    Anion gap 14 5 - 15  CBC     Status: None   Collection Time: 11/09/15  3:57 AM  Result Value Ref Range   WBC 8.2  4.0 - 10.5 K/uL   RBC 4.04 3.87 - 5.11 MIL/uL   Hemoglobin 12.6 12.0 - 15.0 g/dL   HCT 37.4 36.0 - 46.0 %   MCV 92.6 78.0 - 100.0 fL   MCH 31.2 26.0 - 34.0 pg   MCHC 33.7 30.0 - 36.0 g/dL   RDW 12.7 11.5 - 15.5 %   Platelets 164 150 - 400 K/uL  Glucose, capillary     Status: None   Collection Time:  11/09/15  6:37 AM  Result Value Ref Range   Glucose-Capillary 78 65 - 99 mg/dL    Current Facility-Administered Medications  Medication Dose Route Frequency Provider Last Rate Last Dose  . 0.9 %  sodium chloride infusion   Intravenous Continuous Kelvin Cellar, MD 100 mL/hr at 11/09/15 0014    . acetaminophen (TYLENOL) tablet 650 mg  650 mg Oral Q6H PRN Kelvin Cellar, MD   650 mg at 11/08/15 1641   Or  . acetaminophen (TYLENOL) suppository 650 mg  650 mg Rectal Q6H PRN Kelvin Cellar, MD      . buPROPion (WELLBUTRIN XL) 24 hr tablet 300 mg  300 mg Oral Daily Kelvin Cellar, MD   300 mg at 11/08/15 1753  . ceFAZolin (ANCEF) IVPB 2g/100 mL premix  2 g Intravenous On Call to OR Rod Can, MD      . chlorhexidine (HIBICLENS) 4 % liquid 4 application  60 mL Topical Once Rod Can, MD      . citalopram (CELEXA) tablet 40 mg  40 mg Oral Q breakfast Kelvin Cellar, MD      . clonazePAM Bobbye Charleston) tablet 0.5 mg  0.5 mg Oral TID PRN Kelvin Cellar, MD   0.5 mg at 11/09/15 0550  . feeding supplement (ENSURE ENLIVE) (ENSURE ENLIVE) liquid 237 mL  237 mL Oral BID BM Kelvin Cellar, MD      . gabapentin (NEURONTIN) capsule 400 mg  400 mg Oral TID Kelvin Cellar, MD   400 mg at 11/08/15 2223  . guaiFENesin-dextromethorphan (ROBITUSSIN DM) 100-10 MG/5ML syrup 5 mL  5 mL Oral Q4H PRN Hewitt Shorts [place], PA-C   5 mL at 11/09/15 0354  . haloperidol lactate (HALDOL) 5 MG/ML injection           . morphine 2 MG/ML injection 2 mg  2 mg Intravenous Q4H PRN Kelvin Cellar, MD   2 mg at 11/09/15 0617  . ondansetron (ZOFRAN) tablet 4 mg  4 mg Oral Q6H PRN Kelvin Cellar, MD       Or  . ondansetron (ZOFRAN) injection 4 mg  4 mg Intravenous Q6H PRN Kelvin Cellar, MD      . oxyCODONE (Oxy IR/ROXICODONE) immediate release tablet 5 mg  5 mg Oral Q4H PRN Kelvin Cellar, MD   5 mg at 11/08/15 2228  . povidone-iodine 10 % swab 2 application  2 application Topical Once Rod Can, MD      . risperiDONE  (RISPERDAL) tablet 1 mg  1 mg Oral QPM Kelvin Cellar, MD   1 mg at 11/08/15 1753    Musculoskeletal: Strength & Muscle Tone: decreased Gait & Station: unable to stand, Closed left hip fracture secondary to fall at home Patient leans: N/A  Psychiatric Specialty Exam: Review of Systems  Unable to perform ROS   Blood pressure 170/90, pulse 92, temperature 98.4 F (36.9 C), temperature source Oral, resp. rate 22, height '5\' 10"'  (1.778 m), weight 63.504 kg (140 lb), SpO2 98 %.Body mass index is 20.09 kg/(m^2).  General Appearance: Bizarre,  Disheveled and Guarded  Engineer, water::  Fair  Speech:  Blocked, Garbled and Slurred  Volume:  Decreased  Mood:  Anxious and Dysphoric  Affect:  Depressed, Inappropriate, Labile and Tearful  Thought Process:  Disorganized  Orientation:  Negative  Thought Content:  Rumination  Suicidal Thoughts:  No  Homicidal Thoughts:  No  Memory:  Immediate;   Fair Recent;   Poor  Judgement:  Impaired  Insight:  Lacking  Psychomotor Activity:  Restlessness  Concentration:  Poor  Recall:  Poor  Fund of Knowledge:Fair  Language: Fair  Akathisia:  Negative  Handed:  Right  AIMS (if indicated):     Assets:  Communication Skills Desire for Improvement Financial Resources/Insurance Housing Intimacy Leisure Time Resilience Social Support Transportation  ADL's:  Impaired  Cognition: Impaired,  Moderate  Sleep:      Treatment Plan Summary: Daily contact with patient to assess and evaluate symptoms and progress in treatment and Medication management  Patient will continue her psychiatric medication as below Wellbutrin XL 300 mg daily for depression and citalopram 40 mg with breakfast daily Gabapentin 400 mg 3 times daily for anxiety and chronic pain Risperidone 1 mg daily at bedtime as per outpatient providers recommendation to taper it off Clonazepam 1 mg 3 times daily for excessive anxiety Appreciate psychiatric consultation and follow up as clinically  required Please contact [phone] or [phone] if needs further assistance  Disposition: No evidence of imminent risk to self or others at present.   Supportive therapy provided about ongoing stressors.  Durward Parcel., MD 11/09/2015 8:29 AM

## 2015-11-09 NOTE — Progress Notes (Signed)
Pt had bp 177/111, notified provider on call, obtained one time order for 5 mg Hydrazaline.  Administered it.  Will continue to monitor.

## 2015-11-09 NOTE — Progress Notes (Signed)
PROGRESS NOTE  Angel Maddox [id number] DOB: [birth year] DOA: 11/08/2015 PCP: Redge Gainer, MD  Brief History:  60 year old female with a history of bipolar disorder, nephrolithiasis, multiple sclerosis presenting after a mechanical fall on 11/06/2015 while going to the bathroom. Her husband took her back to bed and had been assisting her going to the bathroom since to mechanical fall. The patient's husband brought the patient to the hospital on 11/08/2015 because of increasing pain and inability to bear weight. Husband states that since her son died a little over a year ago, the patient has been depressed and spends most of her time in bed with little mobility and decreased oral intake.  The patient follows with psychology at [facility] who has been adjusting her meds.  Husband states that the patient has never had any history of coronary disease, MI, stroke, diabetes, hypertension. The patient denies any chest pain, headache, shortness of breath, vomiting. X-ray in the emergency department revealed a left femoral neck fracture. Orthopedics was consulted.  Assessment/Plan: Left femoral neck fracture -Appreciate orthopedics -planned pinning 11/09/15 -PT after surgery -pain control  AKI -due to volume depletion -serum creatinine peaked at 1.26 -improved with IVF  Major Depression/Anxiety -psychiatry has been consulted to assist with management -continue citalopram, wellbutrin, risperdal -continue clonazepam 0.5 mg tid prn  Transaminasemia -this has been a problem in recent past after review of medical record -?medication effect -hep b surface antigen -hep c antibody -HIV -no abdominal pain or vomiting  FTT -PT eval after surgery -nutrition consult -B12 -TSH  Hx of Multiple Sclerosis -follows Dr. Jacqulynn Cadet in [place], [place] -Not presently on any immunomodulatory medications  Disposition Plan:   Home in 2-3 days  Family Communication:   Husband updated  at beside 5/15  Consultants:  [facility], psychiatry  Code Status: FULL     Subjective: Patient is tearful throughout the interview. She answers questions intermittently. Denies chest pain, short of breath, headache, abdominal pain. Refuses to answer other questions.  Objective: Filed Vitals:   11/08/15 1421 11/08/15 2207 11/09/15 0531 11/09/15 0644  BP: 170/90 179/93 177/111 170/90  Pulse: 93 94 92   Temp: 97.8 F (36.6 C) 98.7 F (37.1 C) 98.4 F (36.9 C)   TempSrc: Oral Oral Oral   Resp: 24 24 22    Height:      Weight:      SpO2: 99% 98% 98%     Intake/Output Summary (Last 24 hours) at 11/09/15 [id number] Last data filed at 11/09/15 0600  Gross per 24 hour  Intake   1230 ml  Output    950 ml  Net    280 ml   Weight change:  Exam:   General:  Pt is alert, follows commands appropriately, not in acute distress  HEENT: No icterus, No thrush, No neck mass, [place]/AT  Cardiovascular: RRR, S1/S2, no rubs, no gallops  Respiratory: CTA bilaterally, no wheezing, no crackles, no rhonchi  Abdomen: Soft/+BS, non tender, non distended, no guarding  Extremities: No edema, No lymphangitis, No petechiae, No rashes, no synovitis   Data Reviewed: I have personally reviewed following labs and imaging studies Basic Metabolic Panel:  Recent Labs Lab 11/08/15 0839 11/09/15 0357  NA 140 139  K 3.9 3.5  CL 102 107  CO2 19* 18*  GLUCOSE 120* 71  BUN 23* 13  CREATININE 1.26* 0.79  CALCIUM 10.0 8.9   Liver Function Tests:  Recent Labs Lab 11/08/15 0839  AST  103*  ALT 41  ALKPHOS 107  BILITOT 1.5*  PROT 8.7*  ALBUMIN 5.2*   No results for input(s): LIPASE, AMYLASE in the last 168 hours. No results for input(s): AMMONIA in the last 168 hours. Coagulation Profile:  Recent Labs Lab 11/08/15 1348  INR 1.19   CBC:  Recent Labs Lab 11/08/15 0839 11/09/15 0357  WBC 11.2* 8.2  HGB 14.9 12.6  HCT 42.8 37.4  MCV 91.5 92.6  PLT 208 164   Cardiac Enzymes: No  results for input(s): CKTOTAL, CKMB, CKMBINDEX, TROPONINI in the last 168 hours. BNP: Invalid input(s): POCBNP CBG:  Recent Labs Lab 11/09/15 0637  GLUCAP 78   HbA1C: No results for input(s): HGBA1C in the last 72 hours. Urine analysis: No results found for: COLORURINE, APPEARANCEUR, LABSPEC, PHURINE, GLUCOSEU, HGBUR, BILIRUBINUR, KETONESUR, PROTEINUR, UROBILINOGEN, NITRITE, LEUKOCYTESUR Sepsis Labs: @LABRCNTIP (procalcitonin:4,lacticidven:4) ) Recent Results (from the past 240 hour(s))  Surgical PCR screen     Status: None   Collection Time: 11/08/15  3:53 PM  Result Value Ref Range Status   MRSA, PCR NEGATIVE NEGATIVE Final   Staphylococcus aureus NEGATIVE NEGATIVE Final    Comment:        The Xpert SA Assay (FDA approved for NASAL specimens in patients over 48 years of age), is one component of a comprehensive surveillance program.  Test performance has been validated by [facility] for patients greater than or equal to 73 year old. It is not intended to diagnose infection nor to guide or monitor treatment.   MRSA PCR Screening     Status: None   Collection Time: 11/09/15 12:27 AM  Result Value Ref Range Status   MRSA by PCR NEGATIVE NEGATIVE Final    Comment:        The GeneXpert MRSA Assay (FDA approved for NASAL specimens only), is one component of a comprehensive MRSA colonization surveillance program. It is not intended to diagnose MRSA infection nor to guide or monitor treatment for MRSA infections.      Scheduled Meds: . buPROPion  300 mg Oral Daily  .  ceFAZolin (ANCEF) IV  2 g Intravenous On Call to OR  . chlorhexidine  60 mL Topical Once  . citalopram  40 mg Oral Q breakfast  . feeding supplement (ENSURE ENLIVE)  237 mL Oral BID BM  . gabapentin  400 mg Oral TID  . haloperidol lactate      . povidone-iodine  2 application Topical Once  . risperiDONE  1 mg Oral QPM   Continuous Infusions: . sodium chloride 100 mL/hr at 11/09/15 0014     Procedures/Studies: Dg Chest 2 View  11/08/2015  CLINICAL DATA:  60 year old female with 2-3 day history of cough. History of fall yesterday, currently unable to walk. Pain in the left hip. Shortness of breath. EXAM: CHEST  2 VIEW COMPARISON:  No priors. FINDINGS: Lung volumes are normal. No consolidative airspace disease. No pleural effusions. No pneumothorax. No pulmonary nodule or mass noted. Pulmonary vasculature and the cardiomediastinal silhouette are within normal limits. IMPRESSION: No radiographic evidence of acute cardiopulmonary disease. Electronically Signed   By: Vinnie Langton M.D.   On: 11/08/2015 10:24   Dg Hip Unilat With Pelvis 2-3 Views Left  11/08/2015  CLINICAL DATA:  60 year old female with history of trauma from a fall yesterday complaining of pain in the left hip today, unable to walk. EXAM: DG HIP (WITH OR WITHOUT PELVIS) 2-3V LEFT COMPARISON:  No priors. FINDINGS: There is an acute transcervical left-sided femoral neck  fracture with mild valgus angulation and mild impaction. Femoral head remains located in the left acetabulum. Bony pelvis appears intact. Right proximal femur as visualized is intact. Mild moderate degenerative changes of osteoarthritis are noted in the hip joints bilaterally. IMPRESSION: 1. Acute transcervical mildly impacted mildly angulated left femoral neck fracture, as above. 2. Mild to moderate osteoarthritis in the hip joints bilaterally. Electronically Signed   By: Vinnie Langton M.D.   On: 11/08/2015 10:29    Torryn Hudspeth, DO  [facility] Pager [phone]  If 7PM-7AM, please contact night-coverage [email or web address] Password [email or web address] 11/09/2015, 8:21 AM   LOS: 1 day

## 2015-11-09 NOTE — Progress Notes (Signed)
Pt throughout most of the night was trying to get out of the "low bed" and trying to pull out her IV. She was tearful and agitated.  Pt answers "no" if you ask her if she is in pain. Just the same, I administered 2 mg Morphine at 0013, and 10 mg oxycodone about 2228. She received a 0.5 mg clonopin about 2200. Pt was still acting agitated and tearful.  I notified provider on call, obtained order for 1 mg haldol, it didn't have any affect.  In addition to being agitated and restless, pt is having a consistent dry cough.  I notified provider again, and obtained order for 2 mg Haldol IV and 5 cc Robitussin.  I have ordered a Air cabin crew. Husband has been at bedside throughout the night helping with pt.  He stated he is "really tired" and " hasn't slept for 2 nights".  Will continue to monitor.

## 2015-11-09 NOTE — Anesthesia Procedure Notes (Signed)
Procedure Name: Intubation Date/Time: 11/09/2015 3:37 PM Performed by: Lajuana Carry E Pre-anesthesia Checklist: Patient identified, Emergency Drugs available, Suction available and Patient being monitored Patient Re-evaluated:Patient Re-evaluated prior to inductionOxygen Delivery Method: Circle System Utilized Preoxygenation: Pre-oxygenation with 100% oxygen Intubation Type: IV induction Ventilation: Mask ventilation without difficulty Laryngoscope Size: Miller and 2 Grade View: Grade II Tube type: Oral Tube size: 7.0 mm Number of attempts: 1 Airway Equipment and Method: Stylet Placement Confirmation: ETT inserted through vocal cords under direct vision,  positive ETCO2 and breath sounds checked- equal and bilateral Secured at: 21 cm Tube secured with: Tape Dental Injury: Teeth and Oropharynx as per pre-operative assessment

## 2015-11-10 ENCOUNTER — Encounter (HOSPITAL_COMMUNITY): Payer: Self-pay | Admitting: Orthopedic Surgery

## 2015-11-10 LAB — BASIC METABOLIC PANEL
Anion gap: 10 (ref 5–15)
BUN: 6 mg/dL (ref 6–20)
CO2: 23 mmol/L (ref 22–32)
Calcium: 8.4 mg/dL — ABNORMAL LOW (ref 8.9–10.3)
Chloride: 104 mmol/L (ref 101–111)
Creatinine, Ser: 0.6 mg/dL (ref 0.44–1.00)
GFR calc Af Amer: >60 mL/min (ref >60)
GLUCOSE: 122 mg/dL — AB (ref 65–99)
Potassium: 3.2 mmol/L — ABNORMAL LOW (ref 3.5–5.1)
Sodium: 137 mmol/L (ref 135–145)

## 2015-11-10 LAB — CBC
HEMATOCRIT: 33.7 % — AB (ref 36.0–46.0)
Hemoglobin: 11.4 g/dL — ABNORMAL LOW (ref 12.0–15.0)
MCH: 31.3 pg (ref 26.0–34.0)
MCHC: 33.8 g/dL (ref 30.0–36.0)
MCV: 92.6 fL (ref 78.0–100.0)
Platelets: 161 10*3/uL (ref 150–400)
RBC: 3.64 MIL/uL — ABNORMAL LOW (ref 3.87–5.11)
RDW: 12.6 % (ref 11.5–15.5)
WBC: 7 10*3/uL (ref 4.0–10.5)

## 2015-11-10 LAB — HEPATITIS B SURFACE ANTIGEN: HEP B S AG: NEGATIVE

## 2015-11-10 LAB — HIV ANTIBODY (ROUTINE TESTING W REFLEX): HIV SCREEN 4TH GENERATION: NONREACTIVE

## 2015-11-10 LAB — HEPATITIS C ANTIBODY: HCV Ab: <0.1 s/co ratio (ref 0.0–0.9)

## 2015-11-10 MED ORDER — ENOXAPARIN SODIUM 40 MG/0.4ML ~~LOC~~ SOLN: 40.0000 mg | SUBCUTANEOUS | Status: DC

## 2015-11-10 MED ORDER — POTASSIUM CHLORIDE CRYS ER 20 MEQ PO TBCR
40.0000 meq | EXTENDED_RELEASE_TABLET | Freq: Once | ORAL | Status: AC
  Administered 2015-11-10: 40 meq via ORAL
  Filled 2015-11-10: qty 2

## 2015-11-10 MED ORDER — CLONAZEPAM 0.5 MG PO TABS: 0.5000 mg | ORAL_TABLET | Freq: Three times a day (TID) | ORAL | Status: DC | PRN

## 2015-11-10 MED ORDER — HYDRALAZINE HCL 20 MG/ML IJ SOLN: 10.0000 mg | Freq: Four times a day (QID) | INTRAMUSCULAR | Status: DC | PRN

## 2015-11-10 MED ORDER — ENSURE ENLIVE PO LIQD: 237.0000 mL | Freq: Two times a day (BID) | ORAL | Status: DC

## 2015-11-10 MED ORDER — HYDROCODONE-ACETAMINOPHEN 5-325 MG PO TABS: 1.0000 | ORAL_TABLET | ORAL | Status: DC | PRN

## 2015-11-10 MED ORDER — GABAPENTIN 400 MG PO CAPS: 400.0000 mg | ORAL_CAPSULE | Freq: Three times a day (TID) | ORAL | Status: DC

## 2015-11-10 MED FILL — Isopropyl Alcohol 70%: Qty: 480 | Status: AC

## 2015-11-10 NOTE — Clinical Social Work Note (Signed)
Clinical Social Work Assessment  Patient Details  Name: Angel Maddox MRN: [id number] Date of Birth: Feb 16, 1956  Date of referral:  11/10/15               Reason for consult:  Facility Placement, Discharge Planning                Permission sought to share information with:  Chartered certified accountant granted to share information::  Yes, Verbal Permission Granted  Name::        Agency::     Relationship::     Contact Information:     Housing/Transportation Living arrangements for the past 2 months:  Single Family Home Source of Information:  Spouse Patient Interpreter Needed:  None Criminal Activity/Legal Involvement Pertinent to Current Situation/Hospitalization:  No - Comment as needed Significant Relationships:  Adult Children Lives with:  Spouse Do you feel safe going back to the place where you live?   (SNF recommended.) Need for family participation in patient care:  Yes (Comment)  Care giving concerns:  Unclear at this time if needs care be met at home following hospital d/c.   Social Worker assessment / plan: Pt hospitalized on 11/08/15 at falling at home. Surgery was required and completed. Pt is recommending SNF placement at d/c. Pt has a hx of bipolar / depression. Psych has been consulted and does not recommend in pt hospitalization at d/c. CSW met with pt / spouse to assist with d/c planning. Pt / spouse will consider PT recommendation for ST Rehab . SNF search to be initiated and bed offers will be provided when available. CSW will continue to follow to assist with d/c planning.  Employment status:  Unemployed Forensic scientist:  Medicare PT Recommendations:  [place] / Referral to community resources:  [place]  Patient/Family's Response to care:  Disposition to be determined.  Patient/Family's Understanding of and Emotional Response to Diagnosis, Current Treatment, and Prognosis:  Pt reports that  she is feeling more comfortable than when CSW met with her this am. Pt / spouse would prefer d/c home with HHPT but will consider ST Rehab if needed at d/c.   Emotional Assessment Appearance:  Appears stated age Attitude/Demeanor/Rapport:  Other (cooperative) Affect (typically observed):  Anxious Orientation:  Oriented to Self, Oriented to Place, Oriented to  Time, Oriented to Situation Alcohol / Substance use:  Alcohol Use Psych involvement (Current and /or in the community):  Yes (Comment)  Discharge Needs  Concerns to be addressed:  Discharge Planning Concerns Readmission within the last 30 days:  No Current discharge risk:  None Barriers to Discharge:  No Barriers Identified   Luretha Rued, [place] 11/10/2015, 2:11 PM

## 2015-11-10 NOTE — Care Management Note (Signed)
Case Management Note  Patient Details  Name: Angel Maddox MRN: [id number] Date of Birth: 1956/04/16  Subjective/Objective:   S/p Percutaneous screw fixation of left femoral neck fracture.                 Action/Plan: Discharge planning per CSW  Expected Discharge Date:                  Expected Discharge Plan:  [place]  In-House Referral:  Clinical Social Work  Discharge planning Services  CM Consult  Post Acute Care Choice:  NA Choice offered to:  NA  DME Arranged:  N/A DME Agency:  NA  HH Arranged:  NA HH Agency:  NA  Status of Service:  Completed, signed off  Medicare Important Message Given:    Date Medicare IM Given:    Medicare IM give by:    Date Additional Medicare IM Given:    Additional Medicare Important Message give by:     If discussed at [place] of Stay Meetings, dates discussed:    Additional Comments:  Guadalupe Maple, RN 11/10/2015, 10:35 AM [phone]

## 2015-11-10 NOTE — Progress Notes (Addendum)
Elevated BP 180/110 ,notified on call provider .Marland Kitchen Order to give anxiety medicine for now.we will continue to monitor

## 2015-11-10 NOTE — Op Note (Signed)
NAMEMEKAH, KOELSCH NO.:  [id number]  MEDICAL RECORD NO.:  [id number]  LOCATION:  [id number]                         FACILITY:  [facility]  PHYSICIAN:  Rod Can, MD     DATE OF BIRTH:  1956-01-17  DATE OF PROCEDURE:  11/09/2015 DATE OF DISCHARGE:                              OPERATIVE REPORT   SURGEON:  Rod Can, MD  ASSISTANT:  Staff.  PREOPERATIVE DIAGNOSIS:  Valgus impacted left femoral neck fracture.  POSTOPERATIVE DIAGNOSIS:  Valgus impacted left femoral neck fracture.  PROCEDURE PERFORMED:  Percutaneous screw fixation of left femoral neck fracture.  IMPLANTS:  Biomet 8.0-mm cannulated screws x3.  ANESTHESIA:  General.  ANTIBIOTICS:  2 g of Ancef.  COMPLICATION:  None.  TUBES AND DRAINS:  None.  SPECIMENS:  None.  DISPOSITION:  Stable to PACU.  INDICATIONS:  The patient is a 60 year old female, who has a history of severe depression and multiple sclerosis.  She is a minimal ambulator with a walker.  She had a ground-level fall yesterday, had increasing hip pain and inability to weightbear.  She came to the hospital, x-rays revealed a minimally valgus, impacted left femoral neck fracture.  She underwent perioperative risk stratification and medical optimization. Risks, benefits and alternatives to cannulated screw fixation were explained to the patient and her husband, they elected to proceed.  DESCRIPTION OF PROCEDURE IN DETAIL:  I identified the patient in the holding area using two identifiers.  Surgical site was marked by myself. She was taken to the operating room.  General anesthesia was induced on the bed.  She was transferred to the [facility] table.  The right lower extremity was scissored underneath the left.  I applied a small amount of traction, internal rotation, adduction.  The left hip was prepped and draped in normal sterile surgical fashion.  Time-out was called verifying side and site of surgery.  I made a 1-inch incision  over the lateral aspect of the femur.  I used the guidepin to place an inferior pin on the AP x-ray, centrally on the lateral x-ray, I then placed two proximal pins; one anterior, one posterior thus forming an inverted triangle.  The guidepins were placed under fluoroscopic control.  I then measured, drilled the near cortex and placed each screw in succession. Final AP and lateral fluoroscopy views were obtained to ensure that there was no chondral penetration.  Fracture reduction was unchanged from prior.  The wound was copiously irrigated with saline.  I closed the wound in layers with 1 Vicryl for the fascia, 2-0 Monocryl for the deep dermal layer and 3-0 running Monocryl subcuticular stitch.  Glue was applied to the skin.  Once the glue hardened, Aquacel AG dressing was applied.  The patient was then transferred to the bed, extubated, and taken to the PACU in stable condition.  Sponge, needle, and instrument counts were correct at the end of the case x2.  There were no known complications.  I discussed the operative events and findings with the patient's husband.  We will readmit her to the Hospitalist Service.  She may be 50% weightbearing left lower extremity with a walker.  We will start her on Lovenox for DVT  prophylaxis.  I will see her in the office 2 weeks after discharge.  She will receive disposition planning, all questions solicited and answered.          ______________________________ Rod Can, MD     BS/MEDQ  D:  11/09/2015  T:  11/10/2015  Job:  [id number]

## 2015-11-10 NOTE — Progress Notes (Signed)
OT Cancellation Note  Patient Details Name: Angel Maddox MRN: [id number] DOB: 10-Dec-1955   Cancelled Treatment:    Reason Eval/Treat Not Completed: Other (comment).  Pt seen by PT and had cognitive difficulty. Will check back tomorrow.  Caprice Wasko 11/10/2015, 1:01 PM  Lesle Chris, OTR/L [phone] 11/10/2015

## 2015-11-10 NOTE — Progress Notes (Signed)
   Subjective:  Patient reports pain as mild.  No c/o.  Objective:   VITALS:   Filed Vitals:   11/09/15 2144 11/10/15 0140 11/10/15 0337 11/10/15 0615  BP: 169/100 180/110 157/96 170/105  Pulse: 90 86 80 93  Temp: 99 F (37.2 C) 97.8 F (36.6 C)  99.6 F (37.6 C)  TempSrc: Axillary Axillary  Axillary  Resp: 16 18  18   Height:      Weight:      SpO2: 99% 100%  97%    ABD soft Sensation intact distally Intact pulses distally Dorsiflexion/Plantar flexion intact Incision: dressing C/D/I Compartment soft   Lab Results  Component Value Date   WBC 7.0 11/10/2015   HGB 11.4* 11/10/2015   HCT 33.7* 11/10/2015   MCV 92.6 11/10/2015   PLT 161 11/10/2015   BMET    Component Value Date/Time   NA 137 11/10/2015 0410   NA 142 07/20/2015 0852   K 3.2* 11/10/2015 0410   CL 104 11/10/2015 0410   CO2 23 11/10/2015 0410   GLUCOSE 122* 11/10/2015 0410   GLUCOSE 150* 07/20/2015 0852   BUN 6 11/10/2015 0410   BUN 11 07/20/2015 0852   CREATININE 0.60 11/10/2015 0410   CALCIUM 8.4* 11/10/2015 0410   GFRNONAA >60 11/10/2015 0410   GFRAA >60 11/10/2015 0410     Assessment/Plan: 1 Day Post-Op   Principal Problem:   Closed left hip fracture (HCC) Active Problems:   Bipolar 1 disorder, depressed ([place])   Fall at home   Femoral neck fracture, left, closed, initial encounter   AKI (acute kidney injury) ([place])   Protein-calorie malnutrition, severe   Fracture of femoral neck, left, closed   50% WB LLE with walker PT/OT Lovenox x30 days PO pain control D/C planning, ready for d/c from ortho standpoint   Lisabeth Mian, Horald Pollen 11/10/2015, 7:24 AM   Rod Can, MD Cell [phone]

## 2015-11-10 NOTE — Progress Notes (Signed)
Physical Therapy Treatment Note    11/10/15 1500  PT Visit Information  Last PT Received On 11/10/15  Assistance Needed +1  History of Present Illness Pt is a 60 year old female s/p percutaneous screw fixation of left femoral neck fracture with PMHx of MS, tremors, depression, bipolar disorder.  Subjective Data  Subjective Pt assisted with ambulating in hallway with recliner following for safety.  Precautions  Precautions Fall  Restrictions  Weight Bearing Restrictions Yes  LLE Weight Bearing PWB  LLE Partial Weight Bearing Percentage or Pounds 50%  Pain Assessment  Pain Assessment Faces  Faces Pain Scale 6  Pain Location L hip with movement  Pain Descriptors / Indicators Grimacing;Guarding  Pain Intervention(s) Limited activity within patient's tolerance;Monitored during session;Premedicated before session;Repositioned;Ice applied  Cognition  Arousal/Alertness Awake/alert  Behavior During Therapy Flat affect  Area of Impairment Following commands;Problem solving  Following Commands Follows one step commands with increased time  Problem Solving Requires verbal cues;Requires tactile cues;Slow processing  Bed Mobility  Overal bed mobility Needs Assistance  Bed Mobility Supine to Sit;Sit to Supine  Supine to sit Max assist;+2 for physical assistance  Sit to supine Mod assist  General bed mobility comments assist mostly for lower body  Transfers  Overall transfer level Needs assistance  Equipment used Rolling walker (2 wheeled)  Transfers Sit to/from Stand  Sit to Stand Min assist;+2 safety/equipment  General transfer comment multimodal cues for safe technique, assist to rise, steady, and control descent, requires increased time  Ambulation/Gait  Ambulation/Gait assistance +2 safety/equipment;Min assist  Ambulation Distance (Feet) 8 Feet  Assistive device Rolling walker (2 wheeled)  Gait Pattern/deviations Step-to pattern;Antalgic;Decreased stance time - left;Narrow base of  support  General Gait Details verbal cues for sequence, RW positioning, step length, posture, recliner following for safety  PT - End of Session  Equipment Utilized During Treatment Gait belt  Activity Tolerance Patient limited by fatigue  Patient left in bed;with call bell/phone within reach;with bed alarm set;with family/visitor present  PT - Assessment/Plan  PT Plan Current plan remains appropriate  PT Frequency (ACUTE ONLY) Min 4X/week  Follow Up Recommendations SNF;Supervision/Assistance - 24 hour  PT equipment Rolling walker with 5" wheels;3in1 (PT)  PT Goal Progression  Progress towards PT goals Progressing toward goals  PT Time Calculation  PT Start Time (ACUTE ONLY) 1515  PT Stop Time (ACUTE ONLY) 1533  PT Time Calculation (min) (ACUTE ONLY) 18 min  PT General Charges  $$ ACUTE PT VISIT 1 Procedure  PT Treatments  $Gait Training 8-22 mins   [name], PT, DPT 11/10/2015 Pager: [phone]

## 2015-11-10 NOTE — Discharge Summary (Addendum)
Physician Discharge Summary  Angel Maddox [id number] DOB: [birth year] DOA: 11/08/2015  PCP: Redge Gainer, MD  Admit date: 11/08/2015 Discharge date: 11/11/2015 Recommendations for Outpatient Follow-up:  1. Pt will need to follow up with PCP in1- 2 weeks post discharge 2. Please obtain BMP and CBC in 1 week  Discharge Diagnoses:  Left femoral neck fracture -Appreciate orthopedics - 11/09/15--Percutaneous screw fixation of left femoral neck -PT after surgery-->SNF -pain control-->norco -lovenox [place] for DVT prophylaxis at SNF x 30 days  AKI -due to volume depletion -serum creatinine peaked at 1.26 -improved with IVF  Major Depression/Anxiety -psychiatry consult appreciated -continue gabapentin at 400mg  tid -continue citalopram, wellbutrin, risperdal -continue clonazepam 0.5-1 mg tid prn -will need outpt psychiatry followup  Transaminasemia -this has been a problem in recent past after review of medical record -?medication effect -hep b surface antigen--neg -hep c antibody--neg -HIV--neg -no abdominal pain or vomiting -outpt surveillance/follwup  Tremor -may be due to risperdal -no family history -pt states it is quite bothersome-->refer to outpt movement disorder specialtist-->referral made  FTT -PT eval after surgery-->SNF -nutrition consult -B12--574 -TSH--2.486  Severe malnutrition -continue Ensure Enlive  Hx of Multiple Sclerosis -follows Dr. Jacqulynn Cadet in [place], [place] -Not presently on any immunomodulatory medications  Elevated BP: w/o prior hx of HTN -pain contributing some -advise to follow heart healthy diet -will need BP to be reassess at follow up and further adjustment to antihypertensive regimen to be done as needed  -low dose amlodipine has been initiated    Discharge Condition: stable  Disposition: SNF Follow-up Information    Follow up with Swinteck, Horald Pollen, MD. Schedule an appointment as soon as possible for a visit in 2 weeks.    Specialty:  Orthopedic Surgery   Why:  For wound re-check   Contact information:   [address] [place] [address] [phone]       Diet:heart healthy Wt Readings from Last 3 Encounters:  11/08/15 63.504 kg (140 lb)  08/28/15 66.769 kg (147 lb 3.2 oz)  02/11/15 65.318 kg (144 lb)    History of present illness:  60 year old female with a history of bipolar disorder, nephrolithiasis, multiple sclerosis presenting after a mechanical fall on 11/06/2015 while going to the bathroom. Her husband took her back to bed and had been assisting her going to the bathroom since to mechanical fall. The patient's husband brought the patient to the hospital on 11/08/2015 because of increasing pain and inability to bear weight. Husband states that since her son died a little over a year ago, the patient has been depressed and spends most of her time in bed with little mobility and decreased oral intake. The patient follows with psychology at [facility] who has been adjusting her meds. Husband states that the patient has never had any history of coronary disease, MI, stroke, diabetes, hypertension. The patient denies any chest pain, headache, shortness of breath, vomiting. X-ray in the emergency department revealed a left femoral neck fracture. Orthopedics was consulted. Screw fixation was performed on 11/09/2015. Physical therapy was consulted. They recommended skilled nursing facility. Psychiatry was consulted and agreed with present management and recommended outpatient follow-up. The patient also complained of resting tremor that was bothersome. Referral was made to outpatient medical disorder specialist.  Consultants: Ortho--[place]  Discharge Exam: Filed Vitals:   11/10/15 1501 11/10/15 1503  BP: 160/115 183/102  Pulse: 82   Temp: 98.6 F (37 C)   Resp: 18    Filed Vitals:   11/10/15 0935 11/10/15  1103 11/10/15 1501 11/10/15 1503  BP: 156/110 163/93 160/115  183/102  Pulse: 91 81 82   Temp:   98.6 F (37 C)   TempSrc:   Oral   Resp:  20 18   Height:      Weight:      SpO2: 97% 99% 99%    General: A&O x 3, NAD, pleasant, cooperative Cardiovascular: RRR, no rub, no gallop, no S3 Respiratory: CTAB, no wheeze, no rhonchi Abdomen:soft, nontender, nondistended, positive bowel sounds Extremities: No edema, No lymphangitis, no petechiae  Discharge Instructions      Discharge Instructions    Ambulatory referral to Neurology    Complete by:  As directed   Refer to [facility]  Tremor            Medication List    STOP taking these medications        gabapentin 800 MG tablet  Commonly known as:  NEURONTIN  Replaced by:  gabapentin 400 MG capsule     primidone 50 MG tablet  Commonly known as:  MYSOLINE      TAKE these medications        amLODipine 2.5 MG tablet  Commonly known as:  NORVASC  Take 1 tablet (2.5 mg total) by mouth daily.     buPROPion 300 MG 24 hr tablet  Commonly known as:  WELLBUTRIN XL  Take 300 mg by mouth daily.     citalopram 40 MG tablet  Commonly known as:  CELEXA  Take 40 mg by mouth daily with breakfast.     clonazePAM 0.5 MG tablet  Commonly known as:  KLONOPIN  Take 1-2 tablets (0.5-1 mg total) by mouth 3 (three) times daily as needed for anxiety. Anxiety     [employer] ALLERGY RELIEF-D 10-240 MG 24 hr tablet  Generic drug:  loratadine-pseudoephedrine  Take 1 tablet by mouth daily.     enoxaparin 40 MG/0.4ML injection  Commonly known as:  LOVENOX  Inject 0.4 mLs (40 mg total) into the skin daily.     feeding supplement (ENSURE ENLIVE) Liqd  Take 237 mLs by mouth 2 (two) times daily between meals.     gabapentin 400 MG capsule  Commonly known as:  NEURONTIN  Take 1 capsule (400 mg total) by mouth 3 (three) times daily.     HYDROcodone-acetaminophen 5-325 MG tablet  Commonly known as:  NORCO/VICODIN  Take 1-2 tablets by mouth every 4 (four) hours as needed for moderate pain.      multivitamin with minerals Tabs tablet  Take 1 tablet by mouth daily.     risperiDONE 1 MG tablet  Commonly known as:  RISPERDAL  Take 1 tablet (1 mg total) by mouth every evening.         The results of significant diagnostics from this hospitalization (including imaging, microbiology, ancillary and laboratory) are listed below for reference.    Significant Diagnostic Studies: Dg Chest 2 View  11/08/2015  CLINICAL DATA:  60 year old female with 2-3 day history of cough. History of fall yesterday, currently unable to walk. Pain in the left hip. Shortness of breath. EXAM: CHEST  2 VIEW COMPARISON:  No priors. FINDINGS: Lung volumes are normal. No consolidative airspace disease. No pleural effusions. No pneumothorax. No pulmonary nodule or mass noted. Pulmonary vasculature and the cardiomediastinal silhouette are within normal limits. IMPRESSION: No radiographic evidence of acute cardiopulmonary disease. Electronically Signed   By: Vinnie Langton M.D.   On: 11/08/2015 10:24   Pelvis Portable  11/09/2015  CLINICAL DATA:  Status post left hip pinning. EXAM: PORTABLE PELVIS 1-2 VIEWS COMPARISON:  Nov 08, 2015. FINDINGS: Status post surgical pinning and fixation of proximal left femoral neck fracture. Good alignment of fracture components is noted right hip and bilateral sacroiliac joints appear normal. No other fracture or dislocation is noted. IMPRESSION: Status post surgical pinning of proximal left femoral neck fracture. Electronically Signed   By: Marijo Conception, M.D.   On: 11/09/2015 17:39   Dg C-arm 1-60 Min-no Report  11/09/2015  CLINICAL DATA: surgery C-ARM 1-60 MINUTES Fluoroscopy was utilized by the requesting physician.  No radiographic interpretation.   Dg Hip Operative Unilat W Or W/o Pelvis Left  11/09/2015  CLINICAL DATA:  ORIF of a basicervical left femoral neck fracture. EXAM: OPERATIVE LEFT HIP (WITH PELVIS IF PERFORMED) 2 VIEWS TECHNIQUE: Fluoroscopic spot image(s) were  submitted for interpretation post-operatively. COMPARISON:  Preoperative left hip x-rays 11/08/2015. FINDINGS: Three compression screws through the basicervical left femoral neck fracture with anatomic alignment. IMPRESSION: Anatomic alignment post ORIF of the left femoral neck fracture. Electronically Signed   By: Evangeline Dakin M.D.   On: 11/09/2015 17:07   Dg Hip Unilat With Pelvis 2-3 Views Left  11/08/2015  CLINICAL DATA:  60 year old female with history of trauma from a fall yesterday complaining of pain in the left hip today, unable to walk. EXAM: DG HIP (WITH OR WITHOUT PELVIS) 2-3V LEFT COMPARISON:  No priors. FINDINGS: There is an acute transcervical left-sided femoral neck fracture with mild valgus angulation and mild impaction. Femoral head remains located in the left acetabulum. Bony pelvis appears intact. Right proximal femur as visualized is intact. Mild moderate degenerative changes of osteoarthritis are noted in the hip joints bilaterally. IMPRESSION: 1. Acute transcervical mildly impacted mildly angulated left femoral neck fracture, as above. 2. Mild to moderate osteoarthritis in the hip joints bilaterally. Electronically Signed   By: Vinnie Langton M.D.   On: 11/08/2015 10:29     Microbiology: Recent Results (from the past 240 hour(s))  Surgical PCR screen     Status: None   Collection Time: 11/08/15  3:53 PM  Result Value Ref Range Status   MRSA, PCR NEGATIVE NEGATIVE Final   Staphylococcus aureus NEGATIVE NEGATIVE Final    Comment:        The Xpert SA Assay (FDA approved for NASAL specimens in patients over 94 years of age), is one component of a comprehensive surveillance program.  Test performance has been validated by [facility] for patients greater than or equal to 43 year old. It is not intended to diagnose infection nor to guide or monitor treatment.   MRSA PCR Screening     Status: None   Collection Time: 11/09/15 12:27 AM  Result Value Ref Range Status     MRSA by PCR NEGATIVE NEGATIVE Final    Comment:        The GeneXpert MRSA Assay (FDA approved for NASAL specimens only), is one component of a comprehensive MRSA colonization surveillance program. It is not intended to diagnose MRSA infection nor to guide or monitor treatment for MRSA infections.      Labs: Basic Metabolic Panel:  Recent Labs Lab 11/08/15 0839 11/09/15 0357 11/10/15 0410 11/11/15 0407  NA 140 139 137 141  K 3.9 3.5 3.2* 3.5  CL 102 107 104 106  CO2 19* 18* 23 26  GLUCOSE 120* 71 122* 94  BUN 23* 13 6 6   CREATININE 1.26* 0.79 0.60 0.63  CALCIUM  10.0 8.9 8.4* 8.7*  MG  --   --   --  1.7   Liver Function Tests:  Recent Labs Lab 11/08/15 0839  AST 103*  ALT 41  ALKPHOS 107  BILITOT 1.5*  PROT 8.7*  ALBUMIN 5.2*   CBC:  Recent Labs Lab 11/08/15 0839 11/09/15 0357 11/10/15 0410  WBC 11.2* 8.2 7.0  HGB 14.9 12.6 11.4*  HCT 42.8 37.4 33.7*  MCV 91.5 92.6 92.6  PLT 208 164 161   Cardiac Enzymes: No results for input(s): CKTOTAL, CKMB, CKMBINDEX, TROPONINI in the last 168 hours.   BNP: Invalid input(s): POCBNP CBG:  Recent Labs Lab 11/09/15 [id number]  GLUCAP 78    Time coordinating discharge:  Greater than 30 minutes  Signed:  Barton Dubois, MD [facility] Pager: [phone] 11/10/2015, 4:08 PM

## 2015-11-10 NOTE — Progress Notes (Signed)
BP rechecked after an hour 157/96

## 2015-11-10 NOTE — Clinical Social Work Placement (Signed)
   CLINICAL SOCIAL WORK PLACEMENT  NOTE  Date:  11/10/2015  Patient Details  Name: Angel Maddox MRN: [id number] Date of Birth: 06/17/1956  Clinical Social Work is seeking post-discharge placement for this patient at the [place] level of care (*CSW will initial, date and re-position this form in  chart as items are completed):  Yes   Patient/family provided with [facility] Work Department's list of facilities offering this level of care within the geographic area requested by the patient (or if unable, by the patient's family).  Yes   Patient/family informed of their freedom to choose among providers that offer the needed level of care, that participate in Medicare, Medicaid or managed care program needed by the patient, have an available bed and are willing to accept the patient.  Yes   Patient/family informed of [facility]'s ownership interest in [facility], as well as of the fact that they are under no obligation to receive care at these facilities.  PASRR submitted to EDS on       PASRR number received on       Existing PASRR number confirmed on       FL2 transmitted to all facilities in geographic area requested by pt/family on 11/10/15     FL2 transmitted to all facilities within larger geographic area on 11/10/15     Patient informed that his/her managed care company has contracts with or will negotiate with certain facilities, including the following:            Patient/family informed of bed offers received.  Patient chooses bed at       Physician recommends and patient chooses bed at      Patient to be transferred to   on  .  Patient to be transferred to facility by       Patient family notified on   of transfer.  Name of family member notified:        PHYSICIAN       Additional Comment:    _______________________________________________ Luretha Rued, [place] 11/10/2015, 2:20  PM

## 2015-11-10 NOTE — Evaluation (Signed)
Physical Therapy Evaluation Patient Details Name: Angel Maddox MRN: [id number] DOB: 05/29/1956 Today's Date: 11/10/2015   History of Present Illness  Pt is a 60 year old female s/p percutaneous screw fixation of left femoral neck fracture with PMHx of MS, tremors, depression, bipolar disorder.  Clinical Impression  Patient is s/p above surgery resulting in functional limitations due to the deficits listed below (see PT Problem List).  Patient will benefit from skilled PT to increase their independence and safety with mobility to allow discharge to the venue listed below.   Pt presents with cognitive deficits and poor mobility at this time therefore recommend ST-SNF for rehab prior to return home with spouse.       Follow Up Recommendations SNF;Supervision/Assistance - 24 hour    Equipment Recommendations  Rolling walker with 5" wheels;3in1 (PT)    Recommendations for Other Services       Precautions / Restrictions Precautions Precautions: Fall Restrictions Weight Bearing Restrictions: Yes LLE Weight Bearing: Partial weight bearing LLE Partial Weight Bearing Percentage or Pounds: 50%      Mobility  Bed Mobility Overal bed mobility: Needs Assistance Bed Mobility: Sit to Supine       Sit to supine: Mod assist   General bed mobility comments: assist for LEs onto bed  Transfers Overall transfer level: Needs assistance Equipment used: Rolling walker (2 wheeled) Transfers: Sit to/from [facility] Sit to Stand: Min assist Stand pivot transfers: Min assist       General transfer comment: multimodal cues for safe technique, assist to rise, steady, and control descent, requires increased time, pt used BSC and then removed BSC for pt to take a few steps back to bed  Ambulation/Gait Ambulation/Gait assistance:  (pt fatigued )              Stairs            Wheelchair Mobility    Modified Rankin (Stroke Patients Only)       Balance                                             Pertinent Vitals/Pain Pain Assessment: Faces Faces Pain Scale: Hurts even more Pain Location: L hip Pain Descriptors / Indicators: Sore;Grimacing;Guarding Pain Intervention(s): Limited activity within patient's tolerance;Monitored during session;Repositioned;Ice applied    Home Living Family/patient expects to be discharged to:: Private residence Living Arrangements: Spouse/significant other                    Prior Function Level of Independence: Independent               Hand Dominance        Extremity/Trunk Assessment               Lower Extremity Assessment: LLE deficits/detail   LLE Deficits / Details: observed pt guarding left LE, presents with functional hip weakness     Communication   Communication: Other (comment) (very flat, pt provided minimal communication)  Cognition Arousal/Alertness: Awake/alert Behavior During Therapy: Flat affect Overall Cognitive Status: No family/caregiver present to determine baseline cognitive functioning Area of Impairment: Following commands;Problem solving       Following Commands: Follows one step commands with increased time     Problem Solving: Requires verbal cues;Requires tactile cues;Slow processing General Comments: spouse stepped away from room for session, pt presents with very flat  affect, requires cues for safety, increased time, and assist for problem solving    General Comments      Exercises        Assessment/Plan    PT Assessment Patient needs continued PT services  PT Diagnosis Difficulty walking;Acute pain   PT Problem List Decreased strength;Decreased activity tolerance;Decreased balance;Decreased mobility;Pain;Decreased knowledge of use of DME;Decreased cognition  PT Treatment Interventions Gait training;Functional mobility training;Therapeutic activities;Therapeutic exercise;Patient/family education;DME instruction   PT  Goals (Current goals can be found in the Care Plan section) Acute Rehab PT Goals PT Goal Formulation: With patient Time For Goal Achievement: 11/17/15 Potential to Achieve Goals: Good    Frequency Min 4X/week   Barriers to discharge        Co-evaluation               End of Session Equipment Utilized During Treatment: Gait belt Activity Tolerance: Patient limited by fatigue Patient left: in bed;with call bell/phone within reach (low bed, left floor mats on both sides) Nurse Communication: Mobility status         Time: [id number] PT Time Calculation (min) (ACUTE ONLY): 18 min   Charges:   PT Evaluation $PT Eval Moderate Complexity: 1 Procedure     PT G Codes:        Haylee Mcanany,[name] 11/10/2015, 12:25 PM [name], PT, DPT 11/10/2015 Pager: [phone]

## 2015-11-10 NOTE — NC FL2 (Signed)
[place] LEVEL OF CARE SCREENING TOOL     IDENTIFICATION  Patient Name: Angel Maddox Birthdate: Feb 03, 1956 Sex: female Admission Date (Current Location): 11/08/2015  [facility] and [place] Number:  Engineer, manufacturing systems and Address:  [facility],  [place] [address] [place]      Provider Number: [id number]  Attending Physician Name and Address:  Orson Eva, MD  Relative Name and Phone Number:       Current Level of Care: Hospital Recommended Level of Care: [place] Prior Approval Number:    Date Approved/Denied:   PASRR Number:    Discharge Plan: SNF    Current Diagnoses: Patient Active Problem List   Diagnosis Date Noted  . AKI (acute kidney injury) ([place]) 11/09/2015  . Protein-calorie malnutrition, severe 11/09/2015  . Fracture of femoral neck, left, closed 11/09/2015  . Closed left hip fracture ([place]) 11/08/2015  . Fall at home 11/08/2015  . Femoral neck fracture, left, closed, initial encounter 11/08/2015  . Bipolar 1 disorder, depressed ([place]) 02/11/2015    Orientation RESPIRATION BLADDER Height & Weight     Self, Time, Situation, Place  Normal Continent Weight: 63.504 kg (140 lb) Height:  5\' 10"  (177.8 cm)  BEHAVIORAL SYMPTOMS/MOOD NEUROLOGICAL BOWEL NUTRITION STATUS  Other (Comment) (no behaviors)   Continent Diet  AMBULATORY STATUS COMMUNICATION OF NEEDS Skin   Extensive Assist Verbally Surgical wounds                       Personal Care Assistance Level of Assistance  Bathing, Feeding, Dressing Bathing Assistance: Limited assistance Feeding assistance: Independent Dressing Assistance: Limited assistance     Functional Limitations Info  Sight, Hearing, Speech Sight Info: Adequate Hearing Info: Adequate Speech Info: Adequate    SPECIAL CARE FACTORS FREQUENCY  PT (By licensed PT), OT (By licensed OT)     PT Frequency: 5 x wk OT Frequency: 5 x wk            Contractures  Contractures Info: Not present    Additional Factors Info  Code Status Code Status Info: Full Code             Current Medications (11/10/2015):  This is the current hospital active medication list Current Facility-Administered Medications  Medication Dose Route Frequency Provider Last Rate Last Dose  . 0.9 %  sodium chloride infusion   Intravenous Continuous Kelvin Cellar, MD 100 mL/hr at 11/09/15 2138 1,000 mL at 11/09/15 2138  . acetaminophen (TYLENOL) tablet 650 mg  650 mg Oral Q6H PRN Rod Can, MD   650 mg at 11/10/15 0946   Or  . acetaminophen (TYLENOL) suppository 650 mg  650 mg Rectal Q6H PRN Rod Can, MD      . buPROPion (WELLBUTRIN XL) 24 hr tablet 300 mg  300 mg Oral Daily Kelvin Cellar, MD   300 mg at 11/10/15 0946  . citalopram (CELEXA) tablet 40 mg  40 mg Oral Q breakfast Kelvin Cellar, MD   40 mg at 11/10/15 0940  . clonazePAM (KLONOPIN) tablet 0.5 mg  0.5 mg Oral TID PRN Kelvin Cellar, MD   0.5 mg at 11/10/15 0947  . docusate sodium (COLACE) capsule 100 mg  100 mg Oral BID Rod Can, MD   100 mg at 11/10/15 0947  . enoxaparin (LOVENOX) injection 40 mg  40 mg Subcutaneous Q24H Rod Can, MD   40 mg at 11/10/15 [id number]  . feeding supplement (ENSURE ENLIVE) (ENSURE ENLIVE) liquid 237  mL  237 mL Oral BID BM Kelvin Cellar, MD   237 mL at 11/10/15 1500  . gabapentin (NEURONTIN) capsule 400 mg  400 mg Oral TID Kelvin Cellar, MD   400 mg at 11/10/15 0947  . guaiFENesin-dextromethorphan (ROBITUSSIN DM) 100-10 MG/5ML syrup 5 mL  5 mL Oral Q4H PRN Hewitt Shorts [place], PA-C   5 mL at 11/09/15 0354  . hydrALAZINE (APRESOLINE) injection 10 mg  10 mg Intravenous Q6H PRN Orson Eva, MD      . HYDROcodone-acetaminophen (NORCO/VICODIN) 5-325 MG per tablet 1-2 tablet  1-2 tablet Oral Q6H PRN Rod Can, MD   2 tablet at 11/10/15 0610  . magnesium citrate solution 1 Bottle  1 Bottle Oral Once PRN Rod Can, MD      . menthol-cetylpyridinium (CEPACOL) lozenge  3 mg  1 lozenge Oral PRN Rod Can, MD       Or  . phenol (CHLORASEPTIC) mouth spray 1 spray  1 spray Mouth/Throat PRN Rod Can, MD      . methocarbamol (ROBAXIN) tablet 500 mg  500 mg Oral Q6H PRN Rod Can, MD       Or  . methocarbamol (ROBAXIN) 500 mg in dextrose 5 % 50 mL IVPB  500 mg Intravenous Q6H PRN Rod Can, MD   500 mg at 11/09/15 1730  . metoCLOPramide (REGLAN) tablet 5-10 mg  5-10 mg Oral Q8H PRN Rod Can, MD       Or  . metoCLOPramide (REGLAN) injection 5-10 mg  5-10 mg Intravenous Q8H PRN Rod Can, MD   10 mg at 11/10/15 0119  . morphine 2 MG/ML injection 2 mg  2 mg Intravenous Q4H PRN Kelvin Cellar, MD   2 mg at 11/09/15 1409  . ondansetron (ZOFRAN) tablet 4 mg  4 mg Oral Q6H PRN Rod Can, MD       Or  . ondansetron [facility]) injection 4 mg  4 mg Intravenous Q6H PRN Rod Can, MD   4 mg at 11/10/15 0053  . oxyCODONE (Oxy IR/ROXICODONE) immediate release tablet 5 mg  5 mg Oral Q4H PRN Kelvin Cellar, MD   5 mg at 11/10/15 0946  . potassium chloride SA (K-DUR,KLOR-CON) CR tablet 40 mEq  40 mEq Oral Once Shanon Brow Tat, MD      . risperiDONE (RISPERDAL) tablet 1 mg  1 mg Oral QPM Kelvin Cellar, MD   1 mg at 11/08/15 1753  . senna (SENOKOT) tablet 8.6 mg  1 tablet Oral BID Rod Can, MD   8.6 mg at 11/09/15 2135     Discharge Medications: Please see discharge summary for a list of discharge medications.  Relevant Imaging Results:  Relevant Lab Results:   Additional Information SS # [id number]  [name], Randall An, LCSW

## 2015-11-11 DIAGNOSIS — E43 Unspecified severe protein-calorie malnutrition: Secondary | ICD-10-CM | POA: Diagnosis not present

## 2015-11-11 DIAGNOSIS — F418 Other specified anxiety disorders: Secondary | ICD-10-CM | POA: Diagnosis not present

## 2015-11-11 DIAGNOSIS — S72042D Displaced fracture of base of neck of left femur, subsequent encounter for closed fracture with routine healing: Secondary | ICD-10-CM | POA: Diagnosis not present

## 2015-11-11 DIAGNOSIS — I10 Essential (primary) hypertension: Secondary | ICD-10-CM | POA: Diagnosis not present

## 2015-11-11 DIAGNOSIS — S72002D Fracture of unspecified part of neck of left femur, subsequent encounter for closed fracture with routine healing: Secondary | ICD-10-CM | POA: Diagnosis not present

## 2015-11-11 DIAGNOSIS — S72002A Fracture of unspecified part of neck of left femur, initial encounter for closed fracture: Secondary | ICD-10-CM

## 2015-11-11 DIAGNOSIS — G2581 Restless legs syndrome: Secondary | ICD-10-CM | POA: Diagnosis not present

## 2015-11-11 DIAGNOSIS — R2689 Other abnormalities of gait and mobility: Secondary | ICD-10-CM | POA: Diagnosis not present

## 2015-11-11 DIAGNOSIS — N179 Acute kidney failure, unspecified: Secondary | ICD-10-CM

## 2015-11-11 DIAGNOSIS — F319 Bipolar disorder, unspecified: Secondary | ICD-10-CM | POA: Diagnosis not present

## 2015-11-11 DIAGNOSIS — G894 Chronic pain syndrome: Secondary | ICD-10-CM | POA: Diagnosis not present

## 2015-11-11 DIAGNOSIS — M545 Low back pain: Secondary | ICD-10-CM | POA: Diagnosis not present

## 2015-11-11 DIAGNOSIS — G35 Multiple sclerosis: Secondary | ICD-10-CM | POA: Diagnosis not present

## 2015-11-11 DIAGNOSIS — R278 Other lack of coordination: Secondary | ICD-10-CM | POA: Diagnosis not present

## 2015-11-11 DIAGNOSIS — G250 Essential tremor: Secondary | ICD-10-CM | POA: Diagnosis not present

## 2015-11-11 DIAGNOSIS — S728X2A Other fracture of left femur, initial encounter for closed fracture: Secondary | ICD-10-CM | POA: Diagnosis not present

## 2015-11-11 DIAGNOSIS — R030 Elevated blood-pressure reading, without diagnosis of hypertension: Secondary | ICD-10-CM

## 2015-11-11 DIAGNOSIS — F329 Major depressive disorder, single episode, unspecified: Secondary | ICD-10-CM | POA: Diagnosis not present

## 2015-11-11 DIAGNOSIS — F419 Anxiety disorder, unspecified: Secondary | ICD-10-CM | POA: Diagnosis not present

## 2015-11-11 DIAGNOSIS — N200 Calculus of kidney: Secondary | ICD-10-CM | POA: Diagnosis not present

## 2015-11-11 DIAGNOSIS — F3189 Other bipolar disorder: Secondary | ICD-10-CM | POA: Diagnosis not present

## 2015-11-11 DIAGNOSIS — M6281 Muscle weakness (generalized): Secondary | ICD-10-CM | POA: Diagnosis not present

## 2015-11-11 DIAGNOSIS — Z8659 Personal history of other mental and behavioral disorders: Secondary | ICD-10-CM | POA: Diagnosis not present

## 2015-11-11 DIAGNOSIS — R41841 Cognitive communication deficit: Secondary | ICD-10-CM | POA: Diagnosis not present

## 2015-11-11 LAB — CBC
HEMATOCRIT: 36.1 % (ref 36.0–46.0)
Hemoglobin: 12.2 g/dL (ref 12.0–15.0)
MCH: 31.4 pg (ref 26.0–34.0)
MCHC: 33.8 g/dL (ref 30.0–36.0)
MCV: 92.8 fL (ref 78.0–100.0)
Platelets: 179 10*3/uL (ref 150–400)
RBC: 3.89 MIL/uL (ref 3.87–5.11)
RDW: 12.7 % (ref 11.5–15.5)
WBC: 5.5 10*3/uL (ref 4.0–10.5)

## 2015-11-11 LAB — BASIC METABOLIC PANEL
Anion gap: 9 (ref 5–15)
BUN: 6 mg/dL (ref 6–20)
CALCIUM: 8.7 mg/dL — AB (ref 8.9–10.3)
CO2: 26 mmol/L (ref 22–32)
CREATININE: 0.63 mg/dL (ref 0.44–1.00)
Chloride: 106 mmol/L (ref 101–111)
GFR calc Af Amer: >60 mL/min (ref >60)
GFR calc non Af Amer: >60 mL/min (ref >60)
GLUCOSE: 94 mg/dL (ref 65–99)
Potassium: 3.5 mmol/L (ref 3.5–5.1)
Sodium: 141 mmol/L (ref 135–145)

## 2015-11-11 LAB — MAGNESIUM: Magnesium: 1.7 mg/dL (ref 1.7–2.4)

## 2015-11-11 MED ORDER — AMLODIPINE BESYLATE 2.5 MG PO TABS: 2.5000 mg | ORAL_TABLET | Freq: Every day | ORAL | Status: DC

## 2015-11-11 NOTE — Progress Notes (Signed)
Physical Therapy Treatment Patient Details Name: Angel Maddox MRN: [id number] DOB: 12-02-1955 Today's Date: 11/11/2015    History of Present Illness Pt is a 60 year old female s/p percutaneous screw fixation of left femoral neck fracture with PMHx of MS, tremors, depression, bipolar disorder.    PT Comments    Pt assisted off BSC and then able to progress ambulation distance slightly today.  Pt reported she had been up in the chair all morning and requested return to bed due to fatigue.  Pt likely to d/c to SNF today.  Follow Up Recommendations  SNF;Supervision/Assistance - 24 hour     Equipment Recommendations  Rolling walker with 5" wheels;3in1 (PT)    Recommendations for Other Services       Precautions / Restrictions Precautions Precautions: Fall Restrictions Weight Bearing Restrictions: Yes LLE Weight Bearing: Partial weight bearing LLE Partial Weight Bearing Percentage or Pounds: 50%    Mobility  Bed Mobility Overal bed mobility: Needs Assistance Bed Mobility: Sit to Supine     Supine to sit: Max assist Sit to supine: +2 for physical assistance;Max assist   General bed mobility comments: Pt on BSC with OT upon entering room, spouse assisted with trunk back to supine and pt also assisted with lower body onto bed  Transfers Overall transfer level: Needs assistance Equipment used: Rolling walker (2 wheeled) Transfers: Sit to/from Stand Sit to Stand: Min assist;+2 safety/equipment Stand pivot transfers: Min assist;+2 safety/equipment       General transfer comment: verbal cues for sequence and technique.  Assist to rise and stabilize.    Ambulation/Gait Ambulation/Gait assistance: Min assist Ambulation Distance (Feet): 30 Feet Assistive device: Rolling walker (2 wheeled) Gait Pattern/deviations: Step-to pattern;Antalgic;Narrow base of support;Decreased stance time - left     General Gait Details: verbal cues for sequence, step length, posture, assist  for RW positioning   Stairs            Wheelchair Mobility    Modified Rankin (Stroke Patients Only)       Balance                                    Cognition Arousal/Alertness: Awake/alert Behavior During Therapy: Flat affect Overall Cognitive Status: Impaired/Different from baseline Area of Impairment: Following commands;Problem solving       Following Commands: Follows one step commands with increased time     Problem Solving: Requires verbal cues General Comments: decreased memory; extra time for some commands    Exercises      General Comments        Pertinent Vitals/Pain Pain Assessment: Faces Faces Pain Scale: Hurts little more Pain Location: L hip Pain Descriptors / Indicators: Grimacing;Guarding Pain Intervention(s): Limited activity within patient's tolerance;Monitored during session;Repositioned    Home Living Family/patient expects to be discharged to:: Private residence Living Arrangements: Spouse/significant other                  Prior Function Level of Independence: Independent          PT Goals (current goals can now be found in the care plan section) Acute Rehab PT Goals Patient Stated Goal: return to being independent Progress towards PT goals: Progressing toward goals    Frequency  Min 4X/week    PT Plan Current plan remains appropriate    Co-evaluation             End of Session  Equipment Utilized During Treatment: Gait belt Activity Tolerance: Patient limited by fatigue Patient left: in bed;with call bell/phone within reach;with family/visitor present     Time: [id number] PT Time Calculation (min) (ACUTE ONLY): 12 min  Charges:  $Gait Training: 8-22 mins                    G Codes:      Angel Maddox,Angel Maddox 11/12/15, 12:41 PM Angel Maddox, PT, DPT 12-Nov-2015 Pager: [phone]

## 2015-11-11 NOTE — Care Management Important Message (Signed)
Important Message  Patient Details  Name: ALICIYA SURRATT MRN: [id number] Date of Birth: 30-Sep-1955   Medicare Important Message Given:  Yes    Camillo Flaming 11/11/2015, 9:13 AMImportant Message  Patient Details  Name: KELLA GLOECKNER MRN: [id number] Date of Birth: [birth year]   Medicare Important Message Given:  Yes    Camillo Flaming 11/11/2015, 9:13 AM

## 2015-11-11 NOTE — Evaluation (Signed)
Occupational Therapy Evaluation Patient Details Name: Angel Maddox MRN: [id number] DOB: 1956/01/13 Today's Date: 11/11/2015    History of Present Illness Pt is a 60 year old female s/p percutaneous screw fixation of left femoral neck fracture with PMHx of MS, tremors, depression, bipolar disorder.   Clinical Impression   Pt was admitted for the above surgery.  She was independent with adls prior to admission and will benefit from continued OT.  Goals in acute range from set up to min A excluding LB dressing.    Follow Up Recommendations  SNF;Supervision/Assistance - 24 hour    Equipment Recommendations  3 in 1 bedside comode    Recommendations for Other Services       Precautions / Restrictions Precautions Precautions: Fall Restrictions LLE Weight Bearing: Partial weight bearing LLE Partial Weight Bearing Percentage or Pounds: 50%      Mobility Bed Mobility         Supine to sit: Max assist     General bed mobility comments: assist for LLE and trunk  Transfers   Equipment used: Rolling walker (2 wheeled) Transfers: Sit to/from Stand Sit to Stand: Min assist Stand pivot transfers: Min assist            Balance                                            ADL Overall ADL's : Needs assistance/impaired     Grooming: Minimal assistance;Oral care;Sitting (wash face with set up)   Upper Body Bathing: Minimal assitance;Sitting   Lower Body Bathing: Maximal assistance;+2 for safety/equipment;Sit to/from stand   Upper Body Dressing : Minimal assistance;Sitting   Lower Body Dressing: Total assistance;+2 for safety/equipment;Sit to/from stand   Toilet Transfer: Minimal assistance;Stand-pivot;RW (recliner, extra time; cues for sequence)             General ADL Comments: pt's tremors have worsened since sx. She has adapted utensils at home but hasn't used. Would benefit from weighted lidded cup:  will bring later today.  When  brushing teeth, pt used bil UEs to help stabilze--couldn't weight bear through elbow due to tray positioning.  Then switched to LUE which had decreased tremor     Vision     Perception     Praxis      Pertinent Vitals/Pain Pain Assessment: Faces Faces Pain Scale: Hurts little more Pain Location: L hip with weight bearing Pain Intervention(s): Limited activity within patient's tolerance;Monitored during session;Premedicated before session;Repositioned;Ice applied     Hand Dominance     Extremity/Trunk Assessment Upper Extremity Assessment Upper Extremity Assessment: Generalized weakness (tremors worse than baseline R worse than L)           Communication Communication Communication: No difficulties   Cognition Arousal/Alertness: Awake/alert Behavior During Therapy: WFL for tasks assessed/performed Overall Cognitive Status: Within Functional Limits for tasks assessed                     General Comments       Exercises       Shoulder Instructions      Home Living Family/patient expects to be discharged to:: Private residence Living Arrangements: Spouse/significant other  Prior Functioning/Environment Level of Independence: Independent             OT Diagnosis: Generalized weakness;Acute pain   OT Problem List: Decreased strength;Decreased activity tolerance;Impaired balance (sitting and/or standing);Decreased coordination;Decreased knowledge of use of DME or AE;Pain   OT Treatment/Interventions: Self-care/ADL training;DME and/or AE instruction;Balance training;Patient/family education;Therapeutic activities    OT Goals(Current goals can be found in the care plan section) Acute Rehab OT Goals Patient Stated Goal: return to being independent OT Goal Formulation: With patient Time For Goal Achievement: 11/18/15 Potential to Achieve Goals: Good ADL Goals Pt Will Perform Lower Body Bathing: with  min assist;with adaptive equipment;sit to/from stand Pt Will Transfer to Toilet: with min guard assist;bedside commode;stand pivot transfer Pt Will Perform Toileting - Clothing Manipulation and hygiene: with min guard assist;sitting/lateral leans Additional ADL Goal #1: pt will complete UB ADLS, grooming and self feeding with set up from supported sitting with AE as needed  OT Frequency: Min 2X/week   Barriers to D/C:            Co-evaluation              End of Session    Activity Tolerance: Patient tolerated treatment well Patient left: in chair;with call bell/phone within reach;with chair alarm set;with family/visitor present   Time: [id number] OT Time Calculation (min): 28 min Charges:  OT General Charges $OT Visit: 1 Procedure OT Evaluation $OT Eval Moderate Complexity: 1 Procedure OT Treatments $Self Care/Home Management : 8-22 mins G-Codes:    Angel Maddox 12-03-15, 9:25 AM Lesle Chris, OTR/L [phone] 03-Dec-2015

## 2015-11-11 NOTE — Progress Notes (Signed)
Report called to [facility]. All questions answered. Pt left with PTAR in NAD.

## 2015-11-11 NOTE — Progress Notes (Signed)
   11/11/15 1000  OT Visit Information  Last OT Received On 11/11/15  Assistance Needed +1  History of Present Illness Pt is a 60 year old female s/p percutaneous screw fixation of left femoral neck fracture with PMHx of MS, tremors, depression, bipolar disorder.  Precautions  Precautions Fall  Pain Assessment  Pain Assessment Faces  Faces Pain Scale 4  Pain Location L hip  Pain Intervention(s) Limited activity within patient's tolerance;Monitored during session;Premedicated before session;Repositioned  Cognition  Arousal/Alertness Awake/alert  Behavior During Therapy WFL for tasks assessed/performed  Overall Cognitive Status Impaired/Different from baseline  General Comments decreased memory; extra time for some commands  ADL  Toilet Transfer Minimal assistance;Stand-pivot;RW  Toileting- Clothing Manipulation and Hygiene Set up;Sitting/lateral lean  General ADL Comments delivered weighted cup to pt's room and she needed to use commode.  PT came in at end of session and took over  Restrictions  LLE Weight Bearing PWB  LLE Partial Weight Bearing Percentage or Pounds 50%  Transfers  Equipment used Rolling walker (2 wheeled)  Transfers Sit to/from Stand  Sit to [employer] assist  Stand pivot transfers Min assist  General transfer comment cues for sequence.  Assist to rise and stabilize.  Assist to release hands from walker to 3;1 rails  OT - End of Session  Activity Tolerance Patient tolerated treatment well  Patient left (handed off to PT)  OT Assessment/Plan  Follow Up Recommendations SNF;Supervision/Assistance - 24 hour  OT Equipment 3 in 1 bedside comode (had one but not sure she still has it)  OT Goal Progression  Progress towards OT goals Progressing toward goals  OT Time Calculation  OT Start Time (ACUTE ONLY) 1033  OT Stop Time (ACUTE ONLY) 1044  OT Time Calculation (min) 11 min  OT General Charges  $OT Visit 1 Procedure  OT Treatments  $Self Care/Home Management   8-22 mins  Lesle Chris, OTR/L [phone] 11/11/2015

## 2015-11-11 NOTE — Progress Notes (Signed)
Patient seen and examined. Normal BMET, Magnesium and stable Hgb. Pain is controlled and patient hemodynamically stable. Plan is for patient to be discharge to SNF for rehab after Hip fracture. Weight bearing as tolerated, Lovenox for DVT prophylaxis for 30 days and follow up in 2 weeks with orthopedic service. Patient discharge summary and medicaol record has been reviewed and no changes needed. Ok to discharge today 11/11/15 to SNF.  Barton Dubois [phone]

## 2015-11-25 DIAGNOSIS — S72042D Displaced fracture of base of neck of left femur, subsequent encounter for closed fracture with routine healing: Secondary | ICD-10-CM | POA: Diagnosis not present

## 2015-11-25 DIAGNOSIS — Z8659 Personal history of other mental and behavioral disorders: Secondary | ICD-10-CM | POA: Diagnosis not present

## 2015-11-27 ENCOUNTER — Telehealth: Payer: Self-pay | Admitting: Family Medicine

## 2015-11-27 NOTE — Telephone Encounter (Signed)
Patient is currently in [facility] for rehab. Patients husband advised to continue to let rehab administer and then when she is released she will need to contact the surgeon.

## 2015-11-30 ENCOUNTER — Ambulatory Visit: Payer: Medicare Other | Admitting: Family Medicine

## 2015-12-01 ENCOUNTER — Ambulatory Visit: Payer: Medicare Other | Admitting: Family Medicine

## 2015-12-14 DIAGNOSIS — R2689 Other abnormalities of gait and mobility: Secondary | ICD-10-CM | POA: Diagnosis not present

## 2015-12-14 DIAGNOSIS — R41841 Cognitive communication deficit: Secondary | ICD-10-CM | POA: Diagnosis not present

## 2015-12-14 DIAGNOSIS — I10 Essential (primary) hypertension: Secondary | ICD-10-CM | POA: Diagnosis not present

## 2015-12-14 DIAGNOSIS — F419 Anxiety disorder, unspecified: Secondary | ICD-10-CM | POA: Diagnosis not present

## 2015-12-14 DIAGNOSIS — F319 Bipolar disorder, unspecified: Secondary | ICD-10-CM | POA: Diagnosis not present

## 2015-12-14 DIAGNOSIS — Z9181 History of falling: Secondary | ICD-10-CM | POA: Diagnosis not present

## 2015-12-14 DIAGNOSIS — G35 Multiple sclerosis: Secondary | ICD-10-CM | POA: Diagnosis not present

## 2015-12-14 DIAGNOSIS — S72002D Fracture of unspecified part of neck of left femur, subsequent encounter for closed fracture with routine healing: Secondary | ICD-10-CM | POA: Diagnosis not present

## 2015-12-14 DIAGNOSIS — M6281 Muscle weakness (generalized): Secondary | ICD-10-CM | POA: Diagnosis not present

## 2015-12-15 ENCOUNTER — Other Ambulatory Visit: Payer: Self-pay | Admitting: Family Medicine

## 2015-12-15 ENCOUNTER — Encounter: Payer: Self-pay | Admitting: Family Medicine

## 2015-12-15 ENCOUNTER — Ambulatory Visit (INDEPENDENT_AMBULATORY_CARE_PROVIDER_SITE_OTHER): Payer: Medicare Other | Admitting: Family Medicine

## 2015-12-15 VITALS — BP 112/77 | HR 60 | Temp 98.2°F

## 2015-12-15 DIAGNOSIS — Z0000 Encounter for general adult medical examination without abnormal findings: Secondary | ICD-10-CM

## 2015-12-15 DIAGNOSIS — E2839 Other primary ovarian failure: Secondary | ICD-10-CM | POA: Diagnosis not present

## 2015-12-15 DIAGNOSIS — F319 Bipolar disorder, unspecified: Secondary | ICD-10-CM | POA: Diagnosis not present

## 2015-12-15 DIAGNOSIS — Z1239 Encounter for other screening for malignant neoplasm of breast: Secondary | ICD-10-CM | POA: Diagnosis not present

## 2015-12-15 DIAGNOSIS — I10 Essential (primary) hypertension: Secondary | ICD-10-CM

## 2015-12-15 DIAGNOSIS — F419 Anxiety disorder, unspecified: Secondary | ICD-10-CM | POA: Diagnosis not present

## 2015-12-15 DIAGNOSIS — G35 Multiple sclerosis: Secondary | ICD-10-CM | POA: Diagnosis not present

## 2015-12-15 DIAGNOSIS — S72002D Fracture of unspecified part of neck of left femur, subsequent encounter for closed fracture with routine healing: Secondary | ICD-10-CM | POA: Diagnosis not present

## 2015-12-15 DIAGNOSIS — R251 Tremor, unspecified: Secondary | ICD-10-CM | POA: Diagnosis not present

## 2015-12-15 DIAGNOSIS — R41841 Cognitive communication deficit: Secondary | ICD-10-CM | POA: Diagnosis not present

## 2015-12-15 MED ORDER — RISPERIDONE 1 MG PO TABS: 1.0000 mg | ORAL_TABLET | Freq: Every evening | ORAL | Status: DC

## 2015-12-15 MED ORDER — METOPROLOL TARTRATE 50 MG PO TABS: 50.0000 mg | ORAL_TABLET | Freq: Two times a day (BID) | ORAL | Status: DC

## 2015-12-15 MED ORDER — CITALOPRAM HYDROBROMIDE 40 MG PO TABS: 40.0000 mg | ORAL_TABLET | Freq: Every day | ORAL | Status: DC

## 2015-12-15 MED ORDER — CHLORTHALIDONE 25 MG PO TABS: 25.0000 mg | ORAL_TABLET | Freq: Every day | ORAL | Status: DC

## 2015-12-15 MED ORDER — BUPROPION HCL ER (XL) 300 MG PO TB24: 300.0000 mg | ORAL_TABLET | Freq: Every day | ORAL | Status: DC

## 2015-12-15 NOTE — Addendum Note (Signed)
Addended by: Marylin Crosby on: 12/15/2015 04:38 PM   Modules accepted: Orders

## 2015-12-15 NOTE — Progress Notes (Signed)
   HPI  Patient presents today here for nursing home follow-up.  Patient was placed in nursing home after hip fracture about one month ago.  She states she is doing well, she has home health physical therapy.  Bipolar disorder She would like me to take over treatment for bipolar disorder, she's been seeing a psychiatrist for quite some time, she is on several medications including Wellbutrin, Celexa, Klonopin, and Risperdal. She has stopped Lamictal.  She states that she has multiple sclerosis, her current MS doctor, neurologist, has moved to the [place] area. She has a follow-up appointment with neurology coming up.  Hypertension Good medication compliance No chest pain, dyspnea, palpitations, leg edema She does have some mild dizziness with bending and standing  Bipolar disorder Doing well, states that her mood is better than it has been in months. Needs refill on Risperdal and Wellbutrin.  Tremor States that her previous doctor felt it was due to [place], she would like a second opinion.  She is willing to get a mammogram and DEXA scan.      PMH: Smoking status noted ROS: Per HPI  Objective: BP 112/77 mmHg  Pulse 60  Temp(Src) 98.2 F (36.8 C) (Oral) Gen: NAD, alert, cooperative with exam HEENT: NCAT CV: RRR, good S1/S2, no murmur Resp: CTABL, no wheezes, non-labored Ext: No edema, warm Neuro: Alert and oriented, No gross deficits  Assessment and plan:  # Hypertension Well-controlled Discontinue amlodipine, 2.5 mg Continue chlorthalidone and metoprolol Labs Follow-up 4-6 weeks  # Tremor, MS Has neurology follow-up On primidone  # Bipola1 disorder Recommended continuing to see psychiatry, she was considering changing medication management only me. Refill risk for all and Wellbutrin Discussed options for new psychiatrist if she would like to change  # Healthcare maintenance Recommended mammogram, DEXA scan DEXA scan very important given her recent hip  fracture after a fall from standing height    Laroy Apple, MD [place] 12/15/2015, 8:26 AM

## 2015-12-15 NOTE — Patient Instructions (Addendum)
Great to see you!  I have refilled wellbutrin and risperdal, I would like you to continue seeing a psychiatrist  [facility] is a good option, Phone: [phone]   You can stop amlodipine, Come back in 4-6 weeks for HTN follow up

## 2015-12-16 ENCOUNTER — Telehealth: Payer: Self-pay | Admitting: Family Medicine

## 2015-12-16 DIAGNOSIS — S72002D Fracture of unspecified part of neck of left femur, subsequent encounter for closed fracture with routine healing: Secondary | ICD-10-CM | POA: Diagnosis not present

## 2015-12-16 DIAGNOSIS — R41841 Cognitive communication deficit: Secondary | ICD-10-CM | POA: Diagnosis not present

## 2015-12-16 DIAGNOSIS — F419 Anxiety disorder, unspecified: Secondary | ICD-10-CM | POA: Diagnosis not present

## 2015-12-16 DIAGNOSIS — I10 Essential (primary) hypertension: Secondary | ICD-10-CM | POA: Diagnosis not present

## 2015-12-16 DIAGNOSIS — G35 Multiple sclerosis: Secondary | ICD-10-CM | POA: Diagnosis not present

## 2015-12-16 DIAGNOSIS — F319 Bipolar disorder, unspecified: Secondary | ICD-10-CM | POA: Diagnosis not present

## 2015-12-16 LAB — CBC WITH DIFFERENTIAL/PLATELET
BASOS: 0 %
Basophils Absolute: 0 10*3/uL (ref 0.0–0.2)
EOS (ABSOLUTE): 0 10*3/uL (ref 0.0–0.4)
EOS: 1 %
HEMATOCRIT: 40.6 % (ref 34.0–46.6)
HEMOGLOBIN: 13.1 g/dL (ref 11.1–15.9)
Immature Grans (Abs): 0 10*3/uL (ref 0.0–0.1)
Immature Granulocytes: 0 %
LYMPHS ABS: 1.4 10*3/uL (ref 0.7–3.1)
Lymphs: 33 %
MCH: 31 pg (ref 26.6–33.0)
MCHC: 32.3 g/dL (ref 31.5–35.7)
MCV: 96 fL (ref 79–97)
MONOCYTES: 11 %
MONOS ABS: 0.5 10*3/uL (ref 0.1–0.9)
Neutrophils Absolute: 2.4 10*3/uL (ref 1.4–7.0)
Neutrophils: 55 %
Platelets: 241 10*3/uL (ref 150–379)
RBC: 4.23 x10E6/uL (ref 3.77–5.28)
RDW: 14.1 % (ref 12.3–15.4)
WBC: 4.3 10*3/uL (ref 3.4–10.8)

## 2015-12-16 LAB — CMP14+EGFR
A/G RATIO: 1.7 (ref 1.2–2.2)
ALBUMIN: 4.1 g/dL (ref 3.5–5.5)
ALK PHOS: 125 IU/L — AB (ref 39–117)
ALT: 23 IU/L (ref 0–32)
AST: 19 IU/L (ref 0–40)
BUN / CREAT RATIO: 15 (ref 9–23)
BUN: 13 mg/dL (ref 6–24)
Bilirubin Total: 0.2 mg/dL (ref 0.0–1.2)
CALCIUM: 9.5 mg/dL (ref 8.7–10.2)
CO2: 24 mmol/L (ref 18–29)
CREATININE: 0.89 mg/dL (ref 0.57–1.00)
Chloride: 96 mmol/L (ref 96–106)
GFR calc Af Amer: 82 mL/min/{1.73_m2} (ref >59)
GFR, EST NON AFRICAN AMERICAN: 71 mL/min/{1.73_m2} (ref >59)
GLOBULIN, TOTAL: 2.4 g/dL (ref 1.5–4.5)
Glucose: 90 mg/dL (ref 65–99)
POTASSIUM: 3.8 mmol/L (ref 3.5–5.2)
SODIUM: 139 mmol/L (ref 134–144)
Total Protein: 6.5 g/dL (ref 6.0–8.5)

## 2015-12-17 DIAGNOSIS — F319 Bipolar disorder, unspecified: Secondary | ICD-10-CM | POA: Diagnosis not present

## 2015-12-17 DIAGNOSIS — G35 Multiple sclerosis: Secondary | ICD-10-CM | POA: Diagnosis not present

## 2015-12-17 DIAGNOSIS — R41841 Cognitive communication deficit: Secondary | ICD-10-CM | POA: Diagnosis not present

## 2015-12-17 DIAGNOSIS — S72002D Fracture of unspecified part of neck of left femur, subsequent encounter for closed fracture with routine healing: Secondary | ICD-10-CM | POA: Diagnosis not present

## 2015-12-17 DIAGNOSIS — I10 Essential (primary) hypertension: Secondary | ICD-10-CM | POA: Diagnosis not present

## 2015-12-17 DIAGNOSIS — F419 Anxiety disorder, unspecified: Secondary | ICD-10-CM | POA: Diagnosis not present

## 2015-12-17 NOTE — Telephone Encounter (Signed)
Received forms and faxed back

## 2015-12-18 DIAGNOSIS — I10 Essential (primary) hypertension: Secondary | ICD-10-CM | POA: Diagnosis not present

## 2015-12-18 DIAGNOSIS — F319 Bipolar disorder, unspecified: Secondary | ICD-10-CM | POA: Diagnosis not present

## 2015-12-18 DIAGNOSIS — R41841 Cognitive communication deficit: Secondary | ICD-10-CM | POA: Diagnosis not present

## 2015-12-18 DIAGNOSIS — G35 Multiple sclerosis: Secondary | ICD-10-CM | POA: Diagnosis not present

## 2015-12-18 DIAGNOSIS — S72002D Fracture of unspecified part of neck of left femur, subsequent encounter for closed fracture with routine healing: Secondary | ICD-10-CM | POA: Diagnosis not present

## 2015-12-18 DIAGNOSIS — F419 Anxiety disorder, unspecified: Secondary | ICD-10-CM | POA: Diagnosis not present

## 2015-12-21 DIAGNOSIS — G35 Multiple sclerosis: Secondary | ICD-10-CM | POA: Diagnosis not present

## 2015-12-21 DIAGNOSIS — F319 Bipolar disorder, unspecified: Secondary | ICD-10-CM | POA: Diagnosis not present

## 2015-12-21 DIAGNOSIS — I10 Essential (primary) hypertension: Secondary | ICD-10-CM | POA: Diagnosis not present

## 2015-12-21 DIAGNOSIS — R41841 Cognitive communication deficit: Secondary | ICD-10-CM | POA: Diagnosis not present

## 2015-12-21 DIAGNOSIS — S72002D Fracture of unspecified part of neck of left femur, subsequent encounter for closed fracture with routine healing: Secondary | ICD-10-CM | POA: Diagnosis not present

## 2015-12-21 DIAGNOSIS — F419 Anxiety disorder, unspecified: Secondary | ICD-10-CM | POA: Diagnosis not present

## 2015-12-22 DIAGNOSIS — S72002D Fracture of unspecified part of neck of left femur, subsequent encounter for closed fracture with routine healing: Secondary | ICD-10-CM | POA: Diagnosis not present

## 2015-12-22 DIAGNOSIS — G35 Multiple sclerosis: Secondary | ICD-10-CM | POA: Diagnosis not present

## 2015-12-22 DIAGNOSIS — R41841 Cognitive communication deficit: Secondary | ICD-10-CM | POA: Diagnosis not present

## 2015-12-22 DIAGNOSIS — F319 Bipolar disorder, unspecified: Secondary | ICD-10-CM | POA: Diagnosis not present

## 2015-12-22 DIAGNOSIS — I10 Essential (primary) hypertension: Secondary | ICD-10-CM | POA: Diagnosis not present

## 2015-12-22 DIAGNOSIS — F419 Anxiety disorder, unspecified: Secondary | ICD-10-CM | POA: Diagnosis not present

## 2015-12-23 DIAGNOSIS — S72002D Fracture of unspecified part of neck of left femur, subsequent encounter for closed fracture with routine healing: Secondary | ICD-10-CM | POA: Diagnosis not present

## 2015-12-23 DIAGNOSIS — G35 Multiple sclerosis: Secondary | ICD-10-CM | POA: Diagnosis not present

## 2015-12-23 DIAGNOSIS — F419 Anxiety disorder, unspecified: Secondary | ICD-10-CM | POA: Diagnosis not present

## 2015-12-23 DIAGNOSIS — I10 Essential (primary) hypertension: Secondary | ICD-10-CM | POA: Diagnosis not present

## 2015-12-23 DIAGNOSIS — R41841 Cognitive communication deficit: Secondary | ICD-10-CM | POA: Diagnosis not present

## 2015-12-23 DIAGNOSIS — S72042D Displaced fracture of base of neck of left femur, subsequent encounter for closed fracture with routine healing: Secondary | ICD-10-CM | POA: Diagnosis not present

## 2015-12-23 DIAGNOSIS — F319 Bipolar disorder, unspecified: Secondary | ICD-10-CM | POA: Diagnosis not present

## 2015-12-24 DIAGNOSIS — F419 Anxiety disorder, unspecified: Secondary | ICD-10-CM | POA: Diagnosis not present

## 2015-12-24 DIAGNOSIS — S72002D Fracture of unspecified part of neck of left femur, subsequent encounter for closed fracture with routine healing: Secondary | ICD-10-CM | POA: Diagnosis not present

## 2015-12-24 DIAGNOSIS — I10 Essential (primary) hypertension: Secondary | ICD-10-CM | POA: Diagnosis not present

## 2015-12-24 DIAGNOSIS — F319 Bipolar disorder, unspecified: Secondary | ICD-10-CM | POA: Diagnosis not present

## 2015-12-24 DIAGNOSIS — R41841 Cognitive communication deficit: Secondary | ICD-10-CM | POA: Diagnosis not present

## 2015-12-24 DIAGNOSIS — G35 Multiple sclerosis: Secondary | ICD-10-CM | POA: Diagnosis not present

## 2015-12-25 ENCOUNTER — Telehealth: Payer: Self-pay | Admitting: Family Medicine

## 2015-12-25 DIAGNOSIS — R41841 Cognitive communication deficit: Secondary | ICD-10-CM | POA: Diagnosis not present

## 2015-12-25 DIAGNOSIS — S72002D Fracture of unspecified part of neck of left femur, subsequent encounter for closed fracture with routine healing: Secondary | ICD-10-CM | POA: Diagnosis not present

## 2015-12-25 DIAGNOSIS — F319 Bipolar disorder, unspecified: Secondary | ICD-10-CM | POA: Diagnosis not present

## 2015-12-25 DIAGNOSIS — F419 Anxiety disorder, unspecified: Secondary | ICD-10-CM | POA: Diagnosis not present

## 2015-12-25 DIAGNOSIS — F411 Generalized anxiety disorder: Secondary | ICD-10-CM | POA: Diagnosis not present

## 2015-12-25 DIAGNOSIS — I10 Essential (primary) hypertension: Secondary | ICD-10-CM | POA: Diagnosis not present

## 2015-12-25 DIAGNOSIS — G35 Multiple sclerosis: Secondary | ICD-10-CM | POA: Diagnosis not present

## 2015-12-25 NOTE — Telephone Encounter (Signed)
Pt aware and voiced understanding. Pt has appt on 7/19 and will just keep that.

## 2015-12-25 NOTE — Telephone Encounter (Signed)
Pt has 2 open calls regarding this, will close this encounter.

## 2015-12-25 NOTE — Telephone Encounter (Signed)
If her blood pressures are less than [id number] systolic, where she is having dizziness or weakness with standing or at other times please discontinue metoprolol.  Patient may need to be seen if her blood pressure is persistently low.  I recommend follow-up in the next 1-2 weeks for blood pressure management.  Laroy Apple, MD [facility] 12/25/2015, 9:21 AM

## 2015-12-28 ENCOUNTER — Ambulatory Visit: Payer: Self-pay | Admitting: Physical Therapy

## 2015-12-28 DIAGNOSIS — S72002D Fracture of unspecified part of neck of left femur, subsequent encounter for closed fracture with routine healing: Secondary | ICD-10-CM | POA: Diagnosis not present

## 2015-12-28 DIAGNOSIS — G35 Multiple sclerosis: Secondary | ICD-10-CM | POA: Diagnosis not present

## 2015-12-28 DIAGNOSIS — F319 Bipolar disorder, unspecified: Secondary | ICD-10-CM | POA: Diagnosis not present

## 2015-12-28 DIAGNOSIS — I10 Essential (primary) hypertension: Secondary | ICD-10-CM | POA: Diagnosis not present

## 2015-12-28 DIAGNOSIS — R41841 Cognitive communication deficit: Secondary | ICD-10-CM | POA: Diagnosis not present

## 2015-12-28 DIAGNOSIS — F419 Anxiety disorder, unspecified: Secondary | ICD-10-CM | POA: Diagnosis not present

## 2015-12-30 ENCOUNTER — Ambulatory Visit: Payer: Medicare Other | Attending: Orthopedic Surgery | Admitting: Physical Therapy

## 2015-12-30 ENCOUNTER — Encounter: Payer: Self-pay | Admitting: Physical Therapy

## 2015-12-30 VITALS — BP 125/91

## 2015-12-30 DIAGNOSIS — R2689 Other abnormalities of gait and mobility: Secondary | ICD-10-CM

## 2015-12-30 DIAGNOSIS — M6281 Muscle weakness (generalized): Secondary | ICD-10-CM | POA: Diagnosis not present

## 2015-12-30 NOTE — Therapy (Signed)
[facility] [place], [place], [address] Phone: [phone]   Fax:  [phone]  Physical Therapy Evaluation  Patient Details  Name: Angel Maddox MRN: [id number] Date of Birth: March 13, 1956 Referring Provider: Rod Can MD  Encounter Date: 12/30/2015      PT End of Session - 12/30/15 1029    Visit Number 1   Number of Visits 12   Date for PT Re-Evaluation 02/28/16   PT Start Time 0908   PT Stop Time 0957   PT Time Calculation (min) 49 min   Activity Tolerance Patient tolerated treatment well   Behavior During Therapy [facility] for tasks assessed/performed      Past Medical History  Diagnosis Date  . Anxiety   . Chronic kidney disease     kidney stone  . H/O hiatal hernia   . Multiple sclerosis ([place])      Had for 15 years  . Tremors of nervous system   . Depression   . Bipolar 1 disorder [facility])     Past Surgical History  Procedure Laterality Date  . Tubal ligation    . Hip pinning,cannulated Left 11/09/2015    Procedure: CANNULATED HIP PINNING;  Surgeon: Rod Can, MD;  Location: [facility] ORS;  Service: Orthopedics;  Laterality: Left;    Filed Vitals:   12/30/15 1035  BP: 125/91         Subjective Assessment - 12/30/15 1034    Subjective My hip really doesn't hurt.  Just feels stiff at times.   Limitations Walking   How long can you walk comfortably? I'm okay with my walker.   Patient Stated Goals Walk again without a cane.   Currently in Pain? No/denies            [facility] PT Assessment - 12/30/15 0001    Assessment   Medical Diagnosis Displaced    Referring Provider Rod Can MD   Onset Date/Surgical Date --  11/09/15 (surgery date).   Precautions   Precautions Fall   Restrictions   Weight Bearing Restrictions --  WBAT over left LE.   Balance Screen   Has the patient fallen in the past 6 months Yes   How many times? --  2   Has the patient had a decrease in activity level because  of a fear of falling?  Yes   Is the patient reluctant to leave their home because of a fear of falling?  No   Home Environment   Living Environment Private residence   Prior Function   Level of Independence Independent   Observation/Other Assessments   Observations Left hip incision looks good.   ROM / Strength   AROM / PROM / Strength AROM;Strength   AROM   Overall AROM Comments Left hip flexion easily to 90 degrees with mild c/o stiffness.   Strength   Overall Strength Comments Left hip abduction= 3+/5.   Palpation   Palpation comment Mild at most palpable tenderness over and around patient's left hip incisional site.   Special Tests    Special Tests --  Positive Romberg test.   Ambulation/Gait   Gait Pattern Decreased step length - left;Trendelenburg   Gait Comments Patient using a FWW.                   [facility] Adult PT Treatment/Exercise - 12/30/15 0001    Exercises   Exercises Knee/Hip   Knee/Hip Exercises: Aerobic   Nustep Level 3 x 10 minutes.  PT Short Term Goals - 12/30/15 1057    PT SHORT TERM GOAL #1   Title Independent with an initial HEP.   Time 2   Period Weeks   Status New           PT Long Term Goals - 12/30/15 1058    PT LONG TERM GOAL #1   Title Independent with an advanced HEP.   Time 4   Period Weeks   Status New   PT LONG TERM GOAL #2   Title Increase left hip abduction strength to a solid 4+/5 in order to eliminate her Trendelenburg gait pattern.   Time 4   Period Weeks   Status New   PT LONG TERM GOAL #3   Title Walk in clinic with a straight cane 500 feet.   Time 4   Period Weeks   Status New   PT LONG TERM GOAL #4   Title Perform a reciprocating stair gait with one railing.   Time 4   Period Weeks   Status New               Plan - 12/30/15 1038    Clinical Impression Statement The patient fell after getting up from lying down and fractured her left hip.  She underwent a left ORIF  on 11/09/15.  She is not reporting any pain today.  She does, however, report stiffness with certain movements.  She is weak into left hip abduction and she currently needs a walker for safe ambulation.  She has also been reporting dizziness which her doctor is aware of.  Her left hip incision looks good and she has been applying coco butter to it.   Rehab Potential Excellent   PT Frequency 3x / week   PT Duration 4 weeks   PT Treatment/Interventions ADLs/Self Care Home Management;Electrical Stimulation;Moist Heat;Ultrasound;Gait training;Stair training;Therapeutic activities;Therapeutic exercise;Manual techniques;Patient/family education;Passive range of motion   PT Next Visit Plan Nustep; stationary bike; left hip strengthening; gait training; balance activites.   Recommended Other Services Possible Balance Master assessment and treatment for dizziness at our Neuro site.   Consulted and Agree with Plan of Care Patient      Patient will benefit from skilled therapeutic intervention in order to improve the following deficits and impairments:  Pain, Decreased activity tolerance, Decreased strength, Abnormal gait  Visit Diagnosis: Other abnormalities of gait and mobility - Plan: PT plan of care cert/re-cert  Muscle weakness (generalized) - Plan: PT plan of care cert/re-cert      G-Codes - [id number]    Functional Assessment Tool Used FOTO.Marland KitchenMarland KitchenMarland Kitchen64% limitation.   Functional Limitation Mobility: Walking and moving around   Mobility: Walking and Moving Around Current Status [phone]) At least 60 percent but less than 80 percent impaired, limited or restricted   Mobility: Walking and Moving Around Goal Status [phone]) At least 1 percent but less than 20 percent impaired, limited or restricted       Problem List Patient Active Problem List   Diagnosis Date Noted  . Estrogen deficiency 12/15/2015  . MS (multiple sclerosis) ([place]) 12/15/2015  . Tremor 12/15/2015  . Healthcare maintenance  12/15/2015  . HTN (hypertension)   . Protein-calorie malnutrition, severe 11/09/2015  . Closed left hip fracture ([place]) 11/08/2015  . Bipolar 1 disorder, depressed ([place]) 02/11/2015    Michial Disney, [place] MPT 12/30/2015, 11:04 AM  [facility] [address] [place], [place], [address] Phone: [phone]   Fax:  [phone]  Name: Angel Maddox  MRN: [id number] Date of Birth: 1955-10-02

## 2015-12-31 ENCOUNTER — Ambulatory Visit: Payer: Medicare Other | Admitting: Physical Therapy

## 2015-12-31 DIAGNOSIS — R2689 Other abnormalities of gait and mobility: Secondary | ICD-10-CM

## 2015-12-31 DIAGNOSIS — M6281 Muscle weakness (generalized): Secondary | ICD-10-CM | POA: Diagnosis not present

## 2015-12-31 NOTE — Therapy (Signed)
[facility] [place], [place], [address] Phone: [phone]   Fax:  [phone]  Physical Therapy Treatment  Patient Details  Name: Angel Maddox MRN: [id number] Date of Birth: 1955/11/25 Referring Provider: Rod Can MD  Encounter Date: 12/31/2015      PT End of Session - 12/31/15 0830    Visit Number 2   Number of Visits 12   Date for PT Re-Evaluation 02/28/16   PT Start Time 0820   PT Stop Time 0859   PT Time Calculation (min) 39 min   Activity Tolerance Patient tolerated treatment well   Behavior During Therapy [facility] for tasks assessed/performed      Past Medical History  Diagnosis Date  . Anxiety   . Chronic kidney disease     kidney stone  . H/O hiatal hernia   . Multiple sclerosis ([place])      Had for 15 years  . Tremors of nervous system   . Depression   . Bipolar 1 disorder [facility])     Past Surgical History  Procedure Laterality Date  . Tubal ligation    . Hip pinning,cannulated Left 11/09/2015    Procedure: CANNULATED HIP PINNING;  Surgeon: Rod Can, MD;  Location: [facility];  Service: Orthopedics;  Laterality: Left;    There were no vitals filed for this visit.      Subjective Assessment - 12/31/15 0829    Subjective Reports that she really doesn't have pain only stiffness in her leg. Reports that she has not taken any pain medication after leaving nursing home.   Limitations Walking   How long can you walk comfortably? I'm okay with my walker.   Patient Stated Goals Walk again without a cane.   Currently in Pain? No/denies            [facility] PT Assessment - 12/31/15 0001    Assessment   Medical Diagnosis Displaced    Onset Date/Surgical Date 11/09/15   Precautions   Precautions Fall                     [facility] Adult PT Treatment/Exercise - 12/31/15 0001    Knee/Hip Exercises: Aerobic   Nustep L3, seat 11 x12 min   Knee/Hip Exercises: Standing   Terminal Knee  Extension Limitations LLE red theraband x20 reps   Hip Abduction AROM;Left;2 sets;10 reps   Forward Step Up Left;2 sets;10 reps;Hand Hold: 2;Step Height: 4"   Knee/Hip Exercises: Supine   Short Arc Quad Sets Strengthening;Left;3 sets;10 reps;Other (comment)  3#   Bridges Limitations 2x10 reps   Straight Leg Raises AROM;Left;2 sets;10 reps   Other Supine Knee/Hip Exercises B hip adduction 3 sec hold x20 reps                  PT Short Term Goals - 12/30/15 1057    PT SHORT TERM GOAL #1   Title Independent with an initial HEP.   Time 2   Period Weeks   Status New           PT Long Term Goals - 12/30/15 1058    PT LONG TERM GOAL #1   Title Independent with an advanced HEP.   Time 4   Period Weeks   Status New   PT LONG TERM GOAL #2   Title Increase left hip abduction strength to a solid 4+/5 in order to eliminate her Trendelenburg gait pattern.   Time 4   Period Weeks  Status New   PT LONG TERM GOAL #3   Title Walk in clinic with a straight cane 500 feet.   Time 4   Period Weeks   Status New   PT LONG TERM GOAL #4   Title Perform a reciprocating stair gait with one railing.   Time 4   Period Weeks   Status New               Plan - 12/31/15 [id number]    Clinical Impression Statement Patient arrived to treatment with no reports of LLE pain today only reporting stiffness. Patient continues to ambulate with FWW as she feels more confident due to her continued dizzy spells. Patient able to complete all exercises today with no reports of pain only weakness. Patient experienced dizzy spell upon sitting from supine and again in standing prior to TKE. Patient was educated to rest or sit if needed in order to regain equilibrium. Patient was educated regarding the possible evaluation with neuro rehab as stated in evaluation. Patient denied pain following today's treattment.   Rehab Potential Excellent   PT Frequency 3x / week   PT Duration 4 weeks   PT  Treatment/Interventions ADLs/Self Care Home Management;Electrical Stimulation;Moist Heat;Ultrasound;Gait training;Stair training;Therapeutic activities;Therapeutic exercise;Manual techniques;Patient/family education;Passive range of motion   PT Next Visit Plan Continue with LLE strengthening and balance and gait activities per MPT POC. Use caution due to dizziness.   Consulted and Agree with Plan of Care Patient      Patient will benefit from skilled therapeutic intervention in order to improve the following deficits and impairments:  Pain, Decreased activity tolerance, Decreased strength, Abnormal gait  Visit Diagnosis: Other abnormalities of gait and mobility  Muscle weakness (generalized)       G-Codes - 01/05/2016 1101    Functional Assessment Tool Used FOTO.Marland KitchenMarland KitchenMarland Kitchen64% limitation.   Functional Limitation Mobility: Walking and moving around   Mobility: Walking and Moving Around Current Status [phone]) At least 60 percent but less than 80 percent impaired, limited or restricted   Mobility: Walking and Moving Around Goal Status [phone]) At least 1 percent but less than 20 percent impaired, limited or restricted      Problem List Patient Active Problem List   Diagnosis Date Noted  . Estrogen deficiency 12/15/2015  . MS (multiple sclerosis) ([place]) 12/15/2015  . Tremor 12/15/2015  . Healthcare maintenance 12/15/2015  . HTN (hypertension)   . Protein-calorie malnutrition, severe 11/09/2015  . Closed left hip fracture ([place]) 11/08/2015  . Bipolar 1 disorder, depressed ([place]) 02/11/2015    [name], PTA 12/31/2015, 9:11 AM  [facility] [address] [place], [place], [address] Phone: [phone]   Fax:  [phone]  Name: Angel Maddox MRN: [id number] Date of Birth: 02-09-1956

## 2016-01-01 ENCOUNTER — Telehealth: Payer: Self-pay | Admitting: Family Medicine

## 2016-01-01 NOTE — Telephone Encounter (Signed)
Pt aware.

## 2016-01-01 NOTE — Telephone Encounter (Signed)
epleys maneuver handout placed up front for patient.   Laroy Apple, MD [facility] 01/01/2016, 2:57 PM

## 2016-01-04 ENCOUNTER — Ambulatory Visit: Payer: Medicare Other | Admitting: Physical Therapy

## 2016-01-04 ENCOUNTER — Other Ambulatory Visit: Payer: Self-pay

## 2016-01-04 ENCOUNTER — Telehealth: Payer: Self-pay

## 2016-01-04 ENCOUNTER — Encounter: Payer: Self-pay | Admitting: Physical Therapy

## 2016-01-04 DIAGNOSIS — R2689 Other abnormalities of gait and mobility: Secondary | ICD-10-CM

## 2016-01-04 DIAGNOSIS — M6281 Muscle weakness (generalized): Secondary | ICD-10-CM

## 2016-01-04 NOTE — Therapy (Signed)
[facility] [place], [place], [address] Phone: [phone]   Fax:  [phone]  Physical Therapy Treatment  Patient Details  Name: Angel Maddox MRN: [id number] Date of Birth: June 17, 1956 Referring Provider: Rod Can MD  Encounter Date: 01/04/2016      PT End of Session - 01/04/16 0857    Visit Number 3   Number of Visits 12   Date for PT Re-Evaluation 02/28/16   PT Start Time 0902   PT Stop Time 0952   PT Time Calculation (min) 50 min   Activity Tolerance Patient tolerated treatment well   Behavior During Therapy [facility] for tasks assessed/performed      Past Medical History  Diagnosis Date  . Anxiety   . Chronic kidney disease     kidney stone  . H/O hiatal hernia   . Multiple sclerosis ([place])      Had for 15 years  . Tremors of nervous system   . Depression   . Bipolar 1 disorder [facility])     Past Surgical History  Procedure Laterality Date  . Tubal ligation    . Hip pinning,cannulated Left 11/09/2015    Procedure: CANNULATED HIP PINNING;  Surgeon: Rod Can, MD;  [facility];  Service: Orthopedics;  Laterality: Left;    There were no vitals filed for this visit.      Subjective Assessment - 01/04/16 0857    Subjective Reports that at times she notices a twinge of pain when walking but only intermittantly. Reports that she can also tell a limitation when bringing her leg forward. Reports that her husband was to retrieve exercises Dr. Wendi Snipes was giving her for the dizziness   Limitations Walking   How long can you walk comfortably? I'm okay with my walker.   Patient Stated Goals Walk again without a cane.   Currently in Pain? No/denies            [facility] PT Assessment - 01/04/16 0001    Assessment   Medical Diagnosis Displaced    Onset Date/Surgical Date 11/09/15   Precautions   Precautions Fall                     [facility] Adult PT Treatment/Exercise - 01/04/16 0001     Knee/Hip Exercises: Aerobic   Nustep L3, seat 11 x10 min   Knee/Hip Exercises: Standing   Heel Raises Both;2 sets;10 reps   Heel Raises Limitations B toe raise x20 reps   Terminal Knee Extension Limitations LLE red theraband x20 reps   Hip Abduction AROM;Left;2 sets;10 reps   Lateral Step Up Left;2 sets;10 reps;Hand Hold: 2;Step Height: 4"   Forward Step Up Left;2 sets;10 reps;Hand Hold: 2;Step Height: 6"   Knee/Hip Exercises: Supine   Short Arc Quad Sets Strengthening;Left;3 sets;10 reps;Other (comment)  3#   Bridges Limitations 2x10 reps   Straight Leg Raises AROM;Left;2 sets;10 reps  Reported lateral L hip pain that went into groin region   Other Supine Knee/Hip Exercises B hip adduction 3 sec hold x20 reps             Balance Exercises - 01/04/16 0955    Balance Exercises: Standing   Standing Eyes Opened Foam/compliant surface;Other (comment)  x3 min no UE support   Tandem Stance Eyes open;Foam/compliant surface;Other (comment)  Semi tandem stance no UE support x3 min             PT Short Term Goals - 12/30/15 1057  PT SHORT TERM GOAL #1   Title Independent with an initial HEP.   Time 2   Period Weeks   Status New           PT Long Term Goals - 12/30/15 1058    PT LONG TERM GOAL #1   Title Independent with an advanced HEP.   Time 4   Period Weeks   Status New   PT LONG TERM GOAL #2   Title Increase left hip abduction strength to a solid 4+/5 in order to eliminate her Trendelenburg gait pattern.   Time 4   Period Weeks   Status New   PT LONG TERM GOAL #3   Title Walk in clinic with a straight cane 500 feet.   Time 4   Period Weeks   Status New   PT LONG TERM GOAL #4   Title Perform a reciprocating stair gait with one railing.   Time 4   Period Weeks   Status New               Plan - 01/04/16 0957    Clinical Impression Statement Patient arrived to treatment with no reports of LLE pain again today although she intermittantly  experiences "twinges" of pain during ambulation. Patient continues to use FWW for ambulation at this time. Patient continues to experience dizziness especially upon transitiioning from supine to sitting per patient report. Patient experienced L lateral hip pain during SLR today that she said radiated to groin region but pain dissipated with rest. Patient demonstrated L hip adduction/IR with forward step up secondary to abductor weakness. Patient only required a very short rest upon sitting from supine today due to dizziness. Patient displayed LE instability upon initial balance exercises with airex and patient was educated regarding the purpose of proprioceptive exercises. Patient denied LLE pain following today's treatment.   Rehab Potential Excellent   PT Frequency 3x / week   PT Duration 4 weeks   PT Treatment/Interventions ADLs/Self Care Home Management;Electrical Stimulation;Moist Heat;Ultrasound;Gait training;Stair training;Therapeutic activities;Therapeutic exercise;Manual techniques;Patient/family education;Passive range of motion   PT Next Visit Plan Continue with LLE strengthening and balance and gait activities per MPT POC. Use caution due to dizziness.   Consulted and Agree with Plan of Care Patient      Patient will benefit from skilled therapeutic intervention in order to improve the following deficits and impairments:  Pain, Decreased activity tolerance, Decreased strength, Abnormal gait  Visit Diagnosis: Other abnormalities of gait and mobility  Muscle weakness (generalized)     Problem List Patient Active Problem List   Diagnosis Date Noted  . Estrogen deficiency 12/15/2015  . MS (multiple sclerosis) ([place]) 12/15/2015  . Tremor 12/15/2015  . Healthcare maintenance 12/15/2015  . HTN (hypertension)   . Protein-calorie malnutrition, severe 11/09/2015  . Closed left hip fracture ([place]) 11/08/2015  . Bipolar 1 disorder, depressed ([place]) 02/11/2015    [name],  PTA 01/04/2016, 10:07 AM  [facility] [address] [place], [place], [address] Phone: [phone]   Fax:  [phone]  Name: OCEANE STREHLE MRN: [id number] Date of Birth: 1955-10-13

## 2016-01-04 NOTE — Telephone Encounter (Signed)
Called and left message for pt to return call to clinic. Need to get Dr. Janalee Dane records. (Dr. Janalee Dane contact fax: [phone] ph: [phone])

## 2016-01-05 ENCOUNTER — Ambulatory Visit: Payer: Medicare Other | Admitting: *Deleted

## 2016-01-05 DIAGNOSIS — M6281 Muscle weakness (generalized): Secondary | ICD-10-CM

## 2016-01-05 DIAGNOSIS — R2689 Other abnormalities of gait and mobility: Secondary | ICD-10-CM

## 2016-01-05 NOTE — Therapy (Signed)
[facility] [place], [place], [address] Phone: [phone]   Fax:  [phone]  Physical Therapy Treatment  Patient Details  Name: Angel Maddox MRN: [id number] Date of Birth: 1955/11/23 Referring Provider: Rod Can MD  Encounter Date: 01/05/2016      PT End of Session - 01/05/16 0827    Visit Number 4   Number of Visits 12   Date for PT Re-Evaluation 02/28/16   PT Start Time 0815   PT Stop Time 0907   PT Time Calculation (min) 52 min   Activity Tolerance Patient tolerated treatment well   Behavior During Therapy [facility] for tasks assessed/performed      Past Medical History  Diagnosis Date  . Anxiety   . Chronic kidney disease     kidney stone  . H/O hiatal hernia   . Multiple sclerosis ([place])      Had for 15 years  . Tremors of nervous system   . Depression   . Bipolar 1 disorder [facility])     Past Surgical History  Procedure Laterality Date  . Tubal ligation    . Hip pinning,cannulated Left 11/09/2015    Procedure: CANNULATED HIP PINNING;  Surgeon: Rod Can, MD;  [facility];  Service: Orthopedics;  Laterality: Left;    There were no vitals filed for this visit.      Subjective Assessment - 01/05/16 0817    Subjective Gait mainly with FWW.                          [facility] Adult PT Treatment/Exercise - 01/05/16 0001    Exercises   Exercises Knee/Hip   Knee/Hip Exercises: Aerobic   Nustep L4, seat 8  x15  min   Knee/Hip Exercises: Standing   Heel Raises Both;2 sets;10 reps   Heel Raises Limitations B toe raise x20 reps   Terminal Knee Extension Limitations --   Hip Abduction AROM;Left;2 sets;10 reps   Lateral Step Up Left;2 sets;10 reps;Hand Hold: 2;Step Height: 4"   Forward Step Up Left;2 sets;10 reps;Hand Hold: 2;Step Height: 6"   Rocker Board 5 minutes  calf stretch and balance   Knee/Hip Exercises: Supine   Bridges Limitations 2x10 reps   Straight Leg Raises  AROM;Left;2 sets;10 reps  Reported lateral L hip pain that went into groin region                                                                                                   [facility] x5          Balance Exercises - 01/04/16 0955    Balance Exercises: Standing   Standing Eyes Opened Foam/compliant surface;Other (comment)  x3 min no UE support   Tandem Stance Eyes open;Foam/compliant surface;Other (comment)  Semi tandem stance no UE support x3 min             PT Short Term Goals - 12/30/15 1057    PT SHORT TERM GOAL #1   Title Independent with an initial HEP.   Time 2  Period Weeks   Status New           PT Long Term Goals - 12/30/15 1058    PT LONG TERM GOAL #1   Title Independent with an advanced HEP.   Time 4   Period Weeks   Status New   PT LONG TERM GOAL #2   Title Increase left hip abduction strength to a solid 4+/5 in order to eliminate her Trendelenburg gait pattern.   Time 4   Period Weeks   Status New   PT LONG TERM GOAL #3   Title Walk in clinic with a straight cane 500 feet.   Time 4   Period Weeks   Status New   PT LONG TERM GOAL #4   Title Perform a reciprocating stair gait with one railing.   Time 4   Period Weeks   Status New               Plan - 01/05/16 [id number]    Clinical Impression Statement Pt arrived to clinic today ambulating with FWW still and had minimal complaints of pain. She reports a little dizziness when sitting up in the bed this morning, but it resolved fairly quickly. She was able to progress Nustep to L4 and increase time as well as progress in other Exs. No complaints of pain today after Rx.   Rehab Potential Excellent   PT Frequency 3x / week   PT Duration 4 weeks   PT Treatment/Interventions ADLs/Self Care Home Management;Electrical Stimulation;Moist Heat;Ultrasound;Gait training;Stair training;Therapeutic activities;Therapeutic exercise;Manual techniques;Patient/family education;Passive range of motion   PT  Next Visit Plan Continue with LLE strengthening and balance and gait activities per MPT POC. Use caution due to dizziness.   Consulted and Agree with Plan of Care Patient      Patient will benefit from skilled therapeutic intervention in order to improve the following deficits and impairments:  Pain, Decreased activity tolerance, Decreased strength, Abnormal gait  Visit Diagnosis: Other abnormalities of gait and mobility  Muscle weakness (generalized)     Problem List Patient Active Problem List   Diagnosis Date Noted  . Estrogen deficiency 12/15/2015  . MS (multiple sclerosis) ([place]) 12/15/2015  . Tremor 12/15/2015  . Healthcare maintenance 12/15/2015  . HTN (hypertension)   . Protein-calorie malnutrition, severe 11/09/2015  . Closed left hip fracture ([place]) 11/08/2015  . Bipolar 1 disorder, depressed ([place]) 02/11/2015    Angel Maddox,CHRIS, PTA 01/05/2016, 9:07 AM  [facility] [place], [place], [address] Phone: [phone]   Fax:  [phone]  Name: Angel Maddox MRN: [id number] Date of Birth: [birth year]

## 2016-01-06 ENCOUNTER — Ambulatory Visit: Payer: BC Managed Care – PPO | Admitting: Neurology

## 2016-01-06 ENCOUNTER — Encounter: Payer: Self-pay | Admitting: Physical Therapy

## 2016-01-08 ENCOUNTER — Ambulatory Visit: Payer: Medicare Other | Admitting: Physical Therapy

## 2016-01-08 ENCOUNTER — Encounter: Payer: Self-pay | Admitting: Physical Therapy

## 2016-01-08 DIAGNOSIS — M6281 Muscle weakness (generalized): Secondary | ICD-10-CM | POA: Diagnosis not present

## 2016-01-08 DIAGNOSIS — R2689 Other abnormalities of gait and mobility: Secondary | ICD-10-CM

## 2016-01-08 NOTE — Patient Instructions (Signed)
Strengthening: Straight Leg Raise (Phase 1)    Tighten muscles on front of left thigh, then lift leg ____ inches from surface, keeping knee locked.  Repeat _10___ times per set. Do _2-3___ sets per session. Do _2-3___ sessions per day.  http://orth.exer.us/614   Copyright  VHI. All rights reserved.  Bridging    Slowly raise buttocks from floor, keeping stomach tight. Repeat _10___ times per set. Do _2__ sets per session. Do _2-3___ sessions per day.  http://orth.exer.us/1096   Copyright  VHI. All rights reserved.

## 2016-01-08 NOTE — Therapy (Signed)
[facility] [place], [place], [address] Phone: [phone]   Fax:  [phone]  Physical Therapy Treatment  Patient Details  Name: Angel Maddox MRN: [id number] Date of Birth: 1955/11/14 Referring Provider: Rod Can MD  Encounter Date: 01/08/2016      PT End of Session - 01/08/16 0816    Visit Number 5   Number of Visits 12   Date for PT Re-Evaluation 02/28/16   PT Start Time 0816   PT Stop Time 0901   PT Time Calculation (min) 45 min   Activity Tolerance Patient tolerated treatment well   Behavior During Therapy [facility] for tasks assessed/performed      Past Medical History  Diagnosis Date  . Anxiety   . Chronic kidney disease     kidney stone  . H/O hiatal hernia   . Multiple sclerosis ([place])      Had for 15 years  . Tremors of nervous system   . Depression   . Bipolar 1 disorder [facility])     Past Surgical History  Procedure Laterality Date  . Tubal ligation    . Hip pinning,cannulated Left 11/09/2015    Procedure: CANNULATED HIP PINNING;  Surgeon: Rod Can, MD;  [facility];  Service: Orthopedics;  Laterality: Left;    There were no vitals filed for this visit.      Subjective Assessment - 01/08/16 0816    Subjective Reports that her leg is doing okay today and only has intermittant twinges when she stands up.   Limitations Walking   How long can you walk comfortably? I'm okay with my walker.   Patient Stated Goals Walk again without a cane.   Currently in Pain? No/denies            [facility] PT Assessment - 01/08/16 0001    Assessment   Medical Diagnosis Displaced    Onset Date/Surgical Date 11/09/15   Precautions   Precautions Fall                     [facility] Adult PT Treatment/Exercise - 01/08/16 0001    Ambulation/Gait   Ambulation/Gait Yes   Ambulation/Gait Assistance 5: Supervision   Ambulation Distance (Feet) 112 Feet   Assistive device Small based quad  cane   Gait Pattern Step-to pattern;Step-through pattern;Decreased arm swing - left;Decreased step length - right;Decreased stance time - left;Decreased stride length;Decreased hip/knee flexion - left;Antalgic;Decreased trunk rotation;Narrow base of support   Ambulation Surface Level;Indoor   Knee/Hip Exercises: Aerobic   Nustep L4, seat 8  x10  min   Knee/Hip Exercises: Standing   Heel Raises Both;2 sets;10 reps   Heel Raises Limitations B toe raise x20 reps   Terminal Knee Extension Limitations LLE red theraband x20 reps   Forward Step Up Left;2 sets;10 reps;Hand Hold: 2;Step Height: 6"   Knee/Hip Exercises: Supine   Short Arc Quad Sets Strengthening;Left;3 sets;10 reps  3#   Bridges Limitations 2x10 reps   Straight Leg Raises AROM;Left;2 sets;10 reps  Reports of lateral L hip pain with exercise   Other Supine Knee/Hip Exercises B hip adduction 3 sec hold x20 reps                  PT Short Term Goals - 12/30/15 1057    PT SHORT TERM GOAL #1   Title Independent with an initial HEP.   Time 2   Period Weeks   Status New  PT Long Term Goals - 12/30/15 1058    PT LONG TERM GOAL #1   Title Independent with an advanced HEP.   Time 4   Period Weeks   Status New   PT LONG TERM GOAL #2   Title Increase left hip abduction strength to a solid 4+/5 in order to eliminate her Trendelenburg gait pattern.   Time 4   Period Weeks   Status New   PT LONG TERM GOAL #3   Title Walk in clinic with a straight cane 500 feet.   Time 4   Period Weeks   Status New   PT LONG TERM GOAL #4   Title Perform a reciprocating stair gait with one railing.   Time 4   Period Weeks   Status New               Plan - 01/08/16 0935    Clinical Impression Statement Patient continues to present in clinic with no reports of LLE pain upon arrival. Patient did report intermittant twinges of pain in L hip upon standing. Patient continues to experience dizziness and vertigo  symptoms and reports that she is compliant with HEP given by PCP. Tolerates therapeutic exercises well with minimal multimodal cueing required for exercise technique. Patient continues to experience L lateral hip pain with SLR in supine. SLR with ER was very difficult for patient during attempt at the exercise. Patient was anxious to try ambulation with a QC. Patient ambulated fairly well with [facility] a total distance of 112 ft. Patient required cueing in regards to 3 pt gait pattern and reported feeling confident with SBQC. Patient denied pain following today's treatment.   Rehab Potential Excellent   PT Frequency 3x / week   PT Duration 4 weeks   PT Treatment/Interventions ADLs/Self Care Home Management;Electrical Stimulation;Moist Heat;Ultrasound;Gait training;Stair training;Therapeutic activities;Therapeutic exercise;Manual techniques;Patient/family education;Passive range of motion   PT Next Visit Plan Continue with LLE strengthening and balance and gait activities per MPT POC. Use caution due to dizziness.   Consulted and Agree with Plan of Care Patient      Patient will benefit from skilled therapeutic intervention in order to improve the following deficits and impairments:  Pain, Decreased activity tolerance, Decreased strength, Abnormal gait  Visit Diagnosis: Other abnormalities of gait and mobility  Muscle weakness (generalized)     Problem List Patient Active Problem List   Diagnosis Date Noted  . Estrogen deficiency 12/15/2015  . MS (multiple sclerosis) ([place]) 12/15/2015  . Tremor 12/15/2015  . Healthcare maintenance 12/15/2015  . HTN (hypertension)   . Protein-calorie malnutrition, severe 11/09/2015  . Closed left hip fracture ([place]) 11/08/2015  . Bipolar 1 disorder, depressed ([place]) 02/11/2015    [name], PTA 01/08/2016, 9:40 AM  [facility] [address] [place], [place], [address] Phone: [phone]   Fax:   [phone]  Name: FLOIS STADER MRN: [id number] Date of Birth: [birth year]

## 2016-01-11 ENCOUNTER — Encounter: Payer: Self-pay | Admitting: Physical Therapy

## 2016-01-11 ENCOUNTER — Ambulatory Visit: Payer: Medicare Other | Admitting: Physical Therapy

## 2016-01-11 DIAGNOSIS — R2689 Other abnormalities of gait and mobility: Secondary | ICD-10-CM

## 2016-01-11 DIAGNOSIS — M6281 Muscle weakness (generalized): Secondary | ICD-10-CM | POA: Diagnosis not present

## 2016-01-11 NOTE — Therapy (Signed)
[facility] [place], [place], [address] Phone: [phone]   Fax:  [phone]  Physical Therapy Treatment  Patient Details  Name: Angel Maddox MRN: [id number] Date of Birth: 06/09/1956 Referring Provider: Rod Can MD  Encounter Date: 01/11/2016      PT End of Session - 01/11/16 0906    Visit Number 6   Number of Visits 12   Date for PT Re-Evaluation 02/28/16   PT Start Time 0905   PT Stop Time 0945   PT Time Calculation (min) 40 min   Activity Tolerance Patient tolerated treatment well   Behavior During Therapy [facility] for tasks assessed/performed      Past Medical History  Diagnosis Date  . Anxiety   . Chronic kidney disease     kidney stone  . H/O hiatal hernia   . Multiple sclerosis ([place])      Had for 15 years  . Tremors of nervous system   . Depression   . Bipolar 1 disorder [facility])     Past Surgical History  Procedure Laterality Date  . Tubal ligation    . Hip pinning,cannulated Left 11/09/2015    Procedure: CANNULATED HIP PINNING;  Surgeon: Rod Can, MD;  Location: [facility];  Service: Orthopedics;  Laterality: Left;    There were no vitals filed for this visit.      Subjective Assessment - 01/11/16 0906    Subjective Reports that she was up on her feet more yesterday and had to go up many stairs and states she had some pain shooting down LLE yesterday. Denies any LLE pain today.   Limitations Walking   How long can you walk comfortably? I'm okay with my walker.   Patient Stated Goals Walk again without a cane.   Currently in Pain? No/denies            [facility] PT Assessment - 01/11/16 0001    Assessment   Medical Diagnosis Displaced    Onset Date/Surgical Date 11/09/15   Next MD Visit 02/03/2016   Precautions   Precautions Fall                     [facility] Adult PT Treatment/Exercise - 01/11/16 0001    Knee/Hip Exercises: Aerobic   Nustep L4, seat 11 x15  min   Knee/Hip Exercises: Standing   Heel Raises Both;2 sets;10 reps   Heel Raises Limitations B toe raise x20 reps   Knee Flexion AROM;Left;3 sets;10 reps   Terminal Knee Extension Limitations LLE red theraband x20 reps   Hip Abduction AROM;Left;2 sets;10 reps   Hip Extension AROM;Left;2 sets;10 reps   Forward Step Up Left;3 sets;10 reps;Hand Hold: 2;Step Height: 6"   Knee/Hip Exercises: Supine   Short Arc Quad Sets Strengthening;Left;3 sets;10 reps  3# 5 sec hold   Bridges Limitations 2x10 reps   Straight Leg Raises AROM;Left;2 sets;10 reps   Knee Flexion AROM;Both;2 sets;10 reps  B hip flexion and B knee flexion                  PT Short Term Goals - 12/30/15 1057    PT SHORT TERM GOAL #1   Title Independent with an initial HEP.   Time 2   Period Weeks   Status New           PT Long Term Goals - 12/30/15 1058    PT LONG TERM GOAL #1   Title Independent with an advanced HEP.  Time 4   Period Weeks   Status New   PT LONG TERM GOAL #2   Title Increase left hip abduction strength to a solid 4+/5 in order to eliminate her Trendelenburg gait pattern.   Time 4   Period Weeks   Status New   PT LONG TERM GOAL #3   Title Walk in clinic with a straight cane 500 feet.   Time 4   Period Weeks   Status New   PT LONG TERM GOAL #4   Title Perform a reciprocating stair gait with one railing.   Time 4   Period Weeks   Status New               Plan - 01/11/16 0946    Clinical Impression Statement Patient continues to present in clinic with FWW for ambulation at this time and with no reports of LLE pain. Patient experienced a "light" LLE pain on NuStep today that diminished and weakness with supine LLE exercises. Patient able to complete forward step ups well today with no reports of LLE pain. Patient noted that L HS curl in supine was easy today. Patient denied LLE pain upon end of treatment.   Rehab Potential Excellent   PT Frequency 3x / week   PT Duration 4  weeks   PT Treatment/Interventions ADLs/Self Care Home Management;Electrical Stimulation;Moist Heat;Ultrasound;Gait training;Stair training;Therapeutic activities;Therapeutic exercise;Manual techniques;Patient/family education;Passive range of motion   PT Next Visit Plan Continue with LLE strengthening and balance and gait activities per MPT POC. Use caution due to dizziness. Add ankleweight to standing L HS curl.   Consulted and Agree with Plan of Care Patient      Patient will benefit from skilled therapeutic intervention in order to improve the following deficits and impairments:  Pain, Decreased activity tolerance, Decreased strength, Abnormal gait  Visit Diagnosis: Other abnormalities of gait and mobility  Muscle weakness (generalized)     Problem List Patient Active Problem List   Diagnosis Date Noted  . Estrogen deficiency 12/15/2015  . MS (multiple sclerosis) ([place]) 12/15/2015  . Tremor 12/15/2015  . Healthcare maintenance 12/15/2015  . HTN (hypertension)   . Protein-calorie malnutrition, severe 11/09/2015  . Closed left hip fracture ([place]) 11/08/2015  . Bipolar 1 disorder, depressed ([place]) 02/11/2015    [name], PTA 01/11/2016, 9:57 AM  [facility] [address] [place], [place], [address] Phone: [phone]   Fax:  [phone]  Name: Angel Maddox MRN: [id number] Date of Birth: 1956-06-12

## 2016-01-12 ENCOUNTER — Telehealth: Payer: Self-pay | Admitting: Family Medicine

## 2016-01-12 ENCOUNTER — Ambulatory Visit: Payer: Medicare Other | Admitting: Physical Therapy

## 2016-01-12 ENCOUNTER — Encounter: Payer: Self-pay | Admitting: Physical Therapy

## 2016-01-12 ENCOUNTER — Other Ambulatory Visit: Payer: Self-pay | Admitting: Family Medicine

## 2016-01-12 DIAGNOSIS — S72002K Fracture of unspecified part of neck of left femur, subsequent encounter for closed fracture with nonunion: Secondary | ICD-10-CM

## 2016-01-12 DIAGNOSIS — G35 Multiple sclerosis: Secondary | ICD-10-CM

## 2016-01-12 DIAGNOSIS — M6281 Muscle weakness (generalized): Secondary | ICD-10-CM | POA: Diagnosis not present

## 2016-01-12 DIAGNOSIS — R2689 Other abnormalities of gait and mobility: Secondary | ICD-10-CM

## 2016-01-12 NOTE — Telephone Encounter (Signed)
Riding order for quad cane as requested by physical therapy, appreciate their recommendations and care for the patient.  Laroy Apple, MD [facility] 01/12/2016, 3:28 PM

## 2016-01-12 NOTE — Telephone Encounter (Signed)
Patient has appt 01-13-16

## 2016-01-12 NOTE — Therapy (Signed)
[facility] [place], [place], [address] Phone: [phone]   Fax:  [phone]  Physical Therapy Treatment  Patient Details  Name: Angel Maddox MRN: [id number] Date of Birth: [birth year] Referring Provider: Rod Can MD  Encounter Date: 01/12/2016      PT End of Session - 01/12/16 0951    Visit Number 7   Number of Visits 12   Date for PT Re-Evaluation 02/28/16   PT Start Time 0904   PT Stop Time 0946   PT Time Calculation (min) 42 min   Activity Tolerance Patient tolerated treatment well   Behavior During Therapy [facility] for tasks assessed/performed      Past Medical History  Diagnosis Date  . Anxiety   . Chronic kidney disease     kidney stone  . H/O hiatal hernia   . Multiple sclerosis ([place])      Had for 15 years  . Tremors of nervous system   . Depression   . Bipolar 1 disorder [facility])     Past Surgical History  Procedure Laterality Date  . Tubal ligation    . Hip pinning,cannulated Left 11/09/2015    Procedure: CANNULATED HIP PINNING;  Surgeon: Rod Can, MD;  Location: [facility];  Service: Orthopedics;  Laterality: Left;    There were no vitals filed for this visit.      Subjective Assessment - 01/12/16 0906    Subjective No new complaints although she has to pack as her and her husband is going camping this weekend. Reports that exercises given by MD for dizziness in helping with standing but still has trouble with dizziness turning in bed or sitting up.   Limitations Walking   How long can you walk comfortably? I'm okay with my walker.   Patient Stated Goals Walk again without a cane.   Currently in Pain? No/denies            [facility] PT Assessment - 01/12/16 0001    Assessment   Medical Diagnosis Displaced    Onset Date/Surgical Date 11/09/15   Next MD Visit 02/03/2016   Precautions   Precautions Fall                     [facility] Adult PT Treatment/Exercise - 01/12/16  0001    Knee/Hip Exercises: Aerobic   Nustep L5, seat 9 x15 min   Knee/Hip Exercises: Standing   Terminal Knee Extension Limitations LLE pink XTS x20 reps   Forward Step Up Left;3 sets;10 reps;Hand Hold: 2;Step Height: 8"  simulate camper steps   Other Standing Knee Exercises L HS curl 3# x30 reps   Knee/Hip Exercises: Supine   Short Arc Quad Sets Strengthening;Left;3 sets;10 reps  3#   Bridges Limitations 2x10 reps 10 sec hold   Straight Leg Raises AROM;Left;2 sets;10 reps   Knee/Hip Exercises: Sidelying   Hip ABduction Strengthening;Left;2 sets;10 reps                PT Education - 01/12/16 0939    Education provided Yes   Education Details HEP- SLR, bridge, hip abduction   Person(s) Educated Patient   Methods Explanation;Verbal cues;Handout   Comprehension Verbalized understanding;Verbal cues required          PT Short Term Goals - 12/30/15 1057    PT SHORT TERM GOAL #1   Title Independent with an initial HEP.   Time 2   Period Weeks   Status New  PT Long Term Goals - 12/30/15 1058    PT LONG TERM GOAL #1   Title Independent with an advanced HEP.   Time 4   Period Weeks   Status New   PT LONG TERM GOAL #2   Title Increase left hip abduction strength to a solid 4+/5 in order to eliminate her Trendelenburg gait pattern.   Time 4   Period Weeks   Status New   PT LONG TERM GOAL #3   Title Walk in clinic with a straight cane 500 feet.   Time 4   Period Weeks   Status New   PT LONG TERM GOAL #4   Title Perform a reciprocating stair gait with one railing.   Time 4   Period Weeks   Status New               Plan - 01/12/16 [id number]    Clinical Impression Statement Patient continues to progress in regards to LE strengthening exercises. Patient able to tolerate increased NuStep resistance and long duration hold for bridges today. Patient continues to experience LLE "heaviness" as she fatigues with SLR. Sidelying hip abduction was completed  today although patient reported tightness upon end of exercise. 8" step was used for forward step ups to simulate stepping into camper as patient is going camping this weekend. Patient verbalized during treatment that now SAQ and HS curl resistance used today was easy. Patient continues to use FWW for ambulation. Patient accepted new HEP and verbalized understanding in regards to the parameters of the HEP.   Rehab Potential Excellent   PT Frequency 3x / week   PT Duration 4 weeks   PT Treatment/Interventions ADLs/Self Care Home Management;Electrical Stimulation;Moist Heat;Ultrasound;Gait training;Stair training;Therapeutic activities;Therapeutic exercise;Manual techniques;Patient/family education;Passive range of motion   PT Next Visit Plan Continue with LLE strengthening and balance and gait activities per MPT POC. Use caution due to dizziness. Initiate LAQ next treatment and increase HS curl resistance.   Consulted and Agree with Plan of Care Patient      Patient will benefit from skilled therapeutic intervention in order to improve the following deficits and impairments:  Pain, Decreased activity tolerance, Decreased strength, Abnormal gait  Visit Diagnosis: Other abnormalities of gait and mobility  Muscle weakness (generalized)     Problem List Patient Active Problem List   Diagnosis Date Noted  . Estrogen deficiency 12/15/2015  . MS (multiple sclerosis) ([place]) 12/15/2015  . Tremor 12/15/2015  . Healthcare maintenance 12/15/2015  . HTN (hypertension)   . Protein-calorie malnutrition, severe 11/09/2015  . Closed left hip fracture ([place]) 11/08/2015  . Bipolar 1 disorder, depressed ([place]) 02/11/2015    [name], PTA 01/12/2016, 10:02 AM  [facility] [address] [place], [place], [address] Phone: [phone]   Fax:  [phone]  Name: Angel Maddox MRN: [id number] Date of Birth: 1956/03/01

## 2016-01-12 NOTE — Patient Instructions (Signed)
Strengthening: Straight Leg Raise (Phase 1)    Tighten muscles on front of left thigh, then lift leg __4__ inches from surface, keeping knee locked.  Repeat _10___ times per set. Do __2__ sets per session. Do _2-3___ sessions per day.  http://orth.exer.us/614   Copyright  VHI. All rights reserved.  Strengthening: Hip Abduction (Side-Lying)    Tighten muscles on front of left thigh, then lift leg _4___ inches from surface, keeping knee locked.  Repeat __10__ times per set. Do _1-2___ sets per session. Do __2-3__ sessions per day.  http://orth.exer.us/622   Copyright  VHI. All rights reserved.  Bridging    Slowly raise buttocks from floor, keeping stomach tight. Repeat __10__ times per set. Do __2-3__ sets per session. Do __2-3__ sessions per day.  http://orth.exer.us/1096   Copyright  VHI. All rights reserved.

## 2016-01-13 ENCOUNTER — Ambulatory Visit: Payer: BC Managed Care – PPO | Admitting: Neurology

## 2016-01-13 ENCOUNTER — Encounter: Payer: Self-pay | Admitting: Family Medicine

## 2016-01-13 ENCOUNTER — Ambulatory Visit (INDEPENDENT_AMBULATORY_CARE_PROVIDER_SITE_OTHER): Payer: Medicare Other | Admitting: Family Medicine

## 2016-01-13 VITALS — BP 123/85 | HR 70 | Temp 98.7°F | Ht 70.0 in | Wt 140.0 lb

## 2016-01-13 DIAGNOSIS — G35 Multiple sclerosis: Secondary | ICD-10-CM

## 2016-01-13 DIAGNOSIS — I10 Essential (primary) hypertension: Secondary | ICD-10-CM | POA: Diagnosis not present

## 2016-01-13 DIAGNOSIS — H8110 Benign paroxysmal vertigo, unspecified ear: Secondary | ICD-10-CM

## 2016-01-13 NOTE — Progress Notes (Signed)
   HPI  Patient presents today to follow-up for hypertension, MS, and vertigo.  Hypertension Stop amlodipine, not checking blood pressure at home No headache, chest pain, dyspnea.  Multiple sclerosis She's been working with physical therapy and feels like her left leg weakness is improving. She needs a 4 pronged cane. She has neurology follow-up coming up in about 11 days.  Vertigo She has had good improvement with vertigo with Epley's maneuvers She still has some issues with sitting up in bed in the morning. Otherwise she is resolved. She would like to go to vestibular rehabilitation.   PMH: Smoking status noted ROS: Per HPI  Objective: BP 123/85 mmHg  Pulse 70  Temp(Src) 98.7 F (37.1 C) (Oral)  Ht 5\' 10"  (1.778 m)  Wt 140 lb (63.504 kg)  BMI 20.09 kg/m2 Gen: NAD, alert, cooperative with exam HEENT: NCAT, EOMI, PERRL CV: RRR, good S1/S2, no murmur Resp: CTABL, no wheezes, non-labored Ext: No edema, warm Neuro: Alert and oriented, strength 4/5 on left lower extremity, 5/5 on right lower extremity  Assessment and plan:  # Hypertension Well-controlled Continue beta blocker and chlorthalidone  # Multiple sclerosis Has good neurology follow-up both with her neurologist in [facility] as well as a neurologist here in our system. She would like to transfer care locally  # Vertigo Referring vestibular rehabilitation Considering that she has MS there is always a risk that there could be a central lesion causing vertigo, however I do not see evidence of this today. She's had good responsive to Epley's maneuver so far.   Orders Placed This Encounter  Procedures  . PT vestibular rehab    Standing Status: Future     Number of Occurrences:      Standing Expiration Date: 01/12/2017    No orders of the defined types were placed in this encounter.    Laroy Apple, MD [facility] 01/13/2016, 8:42 AM

## 2016-01-13 NOTE — Patient Instructions (Signed)
Great to see you!  Lets see you again in 3-4 months

## 2016-01-18 ENCOUNTER — Ambulatory Visit: Payer: Medicare Other | Admitting: Physical Therapy

## 2016-01-18 ENCOUNTER — Encounter: Payer: Self-pay | Admitting: Physical Therapy

## 2016-01-18 DIAGNOSIS — R2689 Other abnormalities of gait and mobility: Secondary | ICD-10-CM | POA: Diagnosis not present

## 2016-01-18 DIAGNOSIS — M6281 Muscle weakness (generalized): Secondary | ICD-10-CM

## 2016-01-18 NOTE — Therapy (Signed)
[facility] [place], [place], [address] Phone: [phone]   Fax:  [phone]  Physical Therapy Treatment  Patient Details  Name: Angel Maddox MRN: [id number] Date of Birth: 03-Aug-1955 Referring Provider: Rod Can MD  Encounter Date: 01/18/2016      PT End of Session - 01/18/16 0923    Visit Number 8   Number of Visits 12   Date for PT Re-Evaluation 02/28/16   PT Start Time 0902   PT Stop Time 0948   PT Time Calculation (min) 46 min   Activity Tolerance Patient tolerated treatment well   Behavior During Therapy [facility] for tasks assessed/performed      Past Medical History:  Diagnosis Date  . Anxiety   . Bipolar 1 disorder ([place])   . Chronic kidney disease    kidney stone  . Depression   . H/O hiatal hernia   . Multiple sclerosis ([place])     Had for 15 years  . Tremors of nervous system     Past Surgical History:  Procedure Laterality Date  . HIP PINNING,CANNULATED Left 11/09/2015   Procedure: CANNULATED HIP PINNING;  Surgeon: Rod Can, MD;  Location: [facility];  Service: Orthopedics;  Laterality: Left;  . TUBAL LIGATION      There were no vitals filed for this visit.      Subjective Assessment - 01/18/16 0921    Subjective Reports that she used [facility] some with ambulation over the weekend but used FWW when she went shopping. Reports that she has some stiffness after sitting in a car. Reports 4/10 L hip pain when getting out of the car that dissipates when she begins walking from car.   Limitations Walking   How long can you walk comfortably? I'm okay with my walker.   Patient Stated Goals Walk again without a cane.   Currently in Pain? No/denies            [facility] PT Assessment - 01/18/16 0001      Assessment   Medical Diagnosis Displaced    Onset Date/Surgical Date 11/09/15   Next MD Visit 02/03/2016     Precautions   Precautions Fall                     [facility] Adult PT  Treatment/Exercise - 01/18/16 0001      Knee/Hip Exercises: Aerobic   Nustep L6 x15 min     Knee/Hip Exercises: Standing   Heel Raises Both;2 sets;10 reps   Heel Raises Limitations B toe raise x20 reps   Lateral Step Up Left;3 sets;10 reps;Hand Hold: 2;Step Height: 6"   Forward Step Up Left;3 sets;10 reps;Hand Hold: 2;Step Height: 8"   Other Standing Knee Exercises L HS curl 4# x30 reps     Knee/Hip Exercises: Seated   Long Arc Quad Strengthening;Left;3 sets;10 reps;Weights   Long Arc Quad Weight 3 lbs.     Knee/Hip Exercises: Supine   Bridges Limitations 3 x 10 reps   Straight Leg Raises AROM;Left;3 sets;10 reps   Other Supine Knee/Hip Exercises B hip adduction 3 sec hold x20 reps 4# ball     Knee/Hip Exercises: Sidelying   Hip ABduction Strengthening;Left;2 sets;10 reps                  PT Short Term Goals - 12/30/15 1057      PT SHORT TERM GOAL #1   Title Independent with an initial HEP.   Time 2  Period Weeks   Status New           PT Long Term Goals - 12/30/15 1058      PT LONG TERM GOAL #1   Title Independent with an advanced HEP.   Time 4   Period Weeks   Status New     PT LONG TERM GOAL #2   Title Increase left hip abduction strength to a solid 4+/5 in order to eliminate her Trendelenburg gait pattern.   Time 4   Period Weeks   Status New     PT LONG TERM GOAL #3   Title Walk in clinic with a straight cane 500 feet.   Time 4   Period Weeks   Status New     PT LONG TERM GOAL #4   Title Perform a reciprocating stair gait with one railing.   Time 4   Period Weeks   Status New               Plan - 01/18/16 [id number]    Clinical Impression Statement Patient continues to tolerate treatments well and reports that she has the most difficulty with LLE SLR and sidelying L hip abduction. Patient reported stiffness with getting out of car and also reported it during today's treatment with LLE hip abduction. Patient demonstrated L hip  internal rotation during forward step ups today. Now using SBQC for ambulation at this time.    Rehab Potential Excellent   PT Frequency 3x / week   PT Duration 4 weeks   PT Treatment/Interventions ADLs/Self Care Home Management;Electrical Stimulation;Moist Heat;Ultrasound;Gait training;Stair training;Therapeutic activities;Therapeutic exercise;Manual techniques;Patient/family education;Passive range of motion   PT Next Visit Plan Continue with LLE strengthening and balance and gait activities per MPT POC. Use caution due to dizziness. Initiate LAQ next treatment and increase HS curl resistance.   Consulted and Agree with Plan of Care Patient      Patient will benefit from skilled therapeutic intervention in order to improve the following deficits and impairments:  Pain, Decreased activity tolerance, Decreased strength, Abnormal gait  Visit Diagnosis: Other abnormalities of gait and mobility  Muscle weakness (generalized)     Problem List Patient Active Problem List   Diagnosis Date Noted  . Estrogen deficiency 12/15/2015  . MS (multiple sclerosis) ([place]) 12/15/2015  . Tremor 12/15/2015  . Healthcare maintenance 12/15/2015  . HTN (hypertension)   . Protein-calorie malnutrition, severe 11/09/2015  . Closed left hip fracture ([place]) 11/08/2015  . Bipolar 1 disorder, depressed ([place]) 02/11/2015    [name], PTA 01/18/2016, 10:01 AM  [facility] [address] [place], [place], [address] Phone: [phone]   Fax:  [phone]  Name: Angel Maddox MRN: [id number] Date of Birth: 11-Oct-1955

## 2016-01-20 ENCOUNTER — Ambulatory Visit: Payer: Medicare Other | Admitting: Physical Therapy

## 2016-01-20 ENCOUNTER — Encounter: Payer: Self-pay | Admitting: Physical Therapy

## 2016-01-20 DIAGNOSIS — M6281 Muscle weakness (generalized): Secondary | ICD-10-CM

## 2016-01-20 DIAGNOSIS — R2689 Other abnormalities of gait and mobility: Secondary | ICD-10-CM

## 2016-01-20 NOTE — Therapy (Signed)
[facility] [place], [place], [address] Phone: [phone]   Fax:  [phone]  Physical Therapy Treatment  Patient Details  Name: Angel Maddox MRN: [id number] Date of Birth: 1956/01/28 Referring Provider: Rod Can MD  Encounter Date: 01/20/2016      PT End of Session - 01/20/16 0855    Visit Number 9   Number of Visits 12   Date for PT Re-Evaluation 02/28/16   PT Start Time 0901   PT Stop Time 0947   PT Time Calculation (min) 46 min   Activity Tolerance Patient tolerated treatment well   Behavior During Therapy [facility] for tasks assessed/performed      Past Medical History:  Diagnosis Date  . Anxiety   . Bipolar 1 disorder ([place])   . Chronic kidney disease    kidney stone  . Depression   . H/O hiatal hernia   . Multiple sclerosis ([place])     Had for 15 years  . Tremors of nervous system     Past Surgical History:  Procedure Laterality Date  . HIP PINNING,CANNULATED Left 11/09/2015   Procedure: CANNULATED HIP PINNING;  Surgeon: Rod Can, MD;  Location: [facility];  Service: Orthopedics;  Laterality: Left;  . TUBAL LIGATION      There were no vitals filed for this visit.      Subjective Assessment - 01/20/16 0855    Subjective Reports that she had her granddaughter yesterday and was up with her so much with the cane and felt the R neck and shoulder later that day. Patient reports having to take tylenol last night to get the neck/shoulder pain to dissipate. Patient asked if she could get rid of her [facility] to rid her of the neck pain. Patient states that a certain way she turns her leg she feels the pins and has tightness along lateral L hip. Reports that dizziness is getting better as she only has dizziness sometimes wih sitting up.    Limitations Walking   How long can you walk comfortably? I'm okay with my walker.   Patient Stated Goals Walk again without a cane.   Currently in Pain? No/denies             [facility] PT Assessment - 01/20/16 0001      Assessment   Medical Diagnosis Displaced    Onset Date/Surgical Date 11/09/15   Next MD Visit 02/03/2016     Precautions   Precautions Fall                     [facility] Adult PT Treatment/Exercise - 01/20/16 0001      Ambulation/Gait   Ambulation/Gait Yes   Ambulation/Gait Assistance 6: Modified independent (Device/Increase time)   Ambulation Distance (Feet) 80 Feet   Assistive device None   Gait Pattern Step-through pattern;Decreased stance time - left;Decreased stride length;Trendelenburg;Antalgic;Lateral trunk lean to left;Decreased trunk rotation   Ambulation Surface Level;Indoor   Gait velocity slow     Knee/Hip Exercises: Aerobic   Nustep L6 x15 min     Knee/Hip Exercises: Standing   Terminal Knee Extension Limitations LLE pink XTS x30 reps   Lateral Step Up Left;3 sets;10 reps;Hand Hold: 2;Step Height: 6"   Forward Step Up Left;3 sets;10 reps;Hand Hold: 2;Step Height: 8"   Other Standing Knee Exercises L HS curl 4# x30 reps     Knee/Hip Exercises: Seated   Long Arc Quad Strengthening;Left;3 sets;10 reps;Weights   Long Arc Quad Weight 3 lbs.  Knee/Hip Exercises: Supine   Bridges Limitations 3 x 10 reps   Straight Leg Raises AROM;Left;3 sets;10 reps   Other Supine Knee/Hip Exercises B hip clamshell red theraband x30 reps      Knee/Hip Exercises: Sidelying   Hip ABduction Strengthening;Left;2 sets;10 reps                  PT Short Term Goals - 12/30/15 1057      PT SHORT TERM GOAL #1   Title Independent with an initial HEP.   Time 2   Period Weeks   Status New           PT Long Term Goals - 12/30/15 1058      PT LONG TERM GOAL #1   Title Independent with an advanced HEP.   Time 4   Period Weeks   Status New     PT LONG TERM GOAL #2   Title Increase left hip abduction strength to a solid 4+/5 in order to eliminate her Trendelenburg gait pattern.   Time 4   Period Weeks    Status New     PT LONG TERM GOAL #3   Title Walk in clinic with a straight cane 500 feet.   Time 4   Period Weeks   Status New     PT LONG TERM GOAL #4   Title Perform a reciprocating stair gait with one railing.   Time 4   Period Weeks   Status New               Plan - 01/20/16 [id number]    Clinical Impression Statement Patient continues to tolerate treatments well with no reports of increased pain only tightness in anterior L hip with SLR and fatigue with sidelying L hip abduction. L hip IR presented with L forward step up to 8" step. Trendelenberg/antalgic gait noted with modified independent gait in hallway. Patient was educated to try ambulaton without AD in her home and level surfaces but outside home to have AD in case of unlevel ground or fatigue.    Rehab Potential Excellent   PT Frequency 3x / week   PT Duration 4 weeks   PT Treatment/Interventions ADLs/Self Care Home Management;Electrical Stimulation;Moist Heat;Ultrasound;Gait training;Stair training;Therapeutic activities;Therapeutic exercise;Manual techniques;Patient/family education;Passive range of motion   PT Next Visit Plan Continue with LLE strengthening and balance and gait activities per MPT POC. Use caution due to dizziness. Initiate LAQ next treatment and increase HS curl resistance.   Consulted and Agree with Plan of Care Patient      Patient will benefit from skilled therapeutic intervention in order to improve the following deficits and impairments:  Pain, Decreased activity tolerance, Decreased strength, Abnormal gait  Visit Diagnosis: Other abnormalities of gait and mobility  Muscle weakness (generalized)     Problem List Patient Active Problem List   Diagnosis Date Noted  . Estrogen deficiency 12/15/2015  . MS (multiple sclerosis) ([place]) 12/15/2015  . Tremor 12/15/2015  . Healthcare maintenance 12/15/2015  . HTN (hypertension)   . Protein-calorie malnutrition, severe 11/09/2015  . Closed  left hip fracture ([place]) 11/08/2015  . Bipolar 1 disorder, depressed ([place]) 02/11/2015    [name], PTA 01/20/2016, 9:59 AM  [facility] [address] [place], [place], [address] Phone: [phone]   Fax:  [phone]  Name: Angel Maddox MRN: [id number] Date of Birth: Oct 26, 1955

## 2016-01-22 ENCOUNTER — Encounter: Payer: Self-pay | Admitting: Physical Therapy

## 2016-01-22 ENCOUNTER — Ambulatory Visit: Payer: Medicare Other | Admitting: Physical Therapy

## 2016-01-22 DIAGNOSIS — R2689 Other abnormalities of gait and mobility: Secondary | ICD-10-CM

## 2016-01-22 DIAGNOSIS — M6281 Muscle weakness (generalized): Secondary | ICD-10-CM | POA: Diagnosis not present

## 2016-01-22 NOTE — Therapy (Signed)
[name], [place], [address] Phone: [phone]   Fax:  [phone]  Physical Therapy Treatment  Patient Details  Name: Angel Maddox MRN: [id number] Date of Birth: 12/07/1955 Referring Provider: Rod Can MD  Encounter Date: 01/22/2016      PT End of Session - 01/22/16 0904    Visit Number 10   Number of Visits 12   Date for PT Re-Evaluation 02/28/16   PT Start Time 0901   PT Stop Time 0946   PT Time Calculation (min) 45 min   Activity Tolerance Patient tolerated treatment well   Behavior During Therapy [facility] for tasks assessed/performed      Past Medical History:  Diagnosis Date  . Anxiety   . Bipolar 1 disorder ([place])   . Chronic kidney disease    kidney stone  . Depression   . H/O hiatal hernia   . Multiple sclerosis ([place])     Had for 15 years  . Tremors of nervous system     Past Surgical History:  Procedure Laterality Date  . HIP PINNING,CANNULATED Left 11/09/2015   Procedure: CANNULATED HIP PINNING;  Surgeon: Rod Can, MD;  Location: [facility];  Service: Orthopedics;  Laterality: Left;  . TUBAL LIGATION      There were no vitals filed for this visit.      Subjective Assessment - 01/22/16 0903    Subjective Reports that she thinks she may have overdone it Wednesday when she got home as she quit using cane and was doing loads of laundry and had increased pain. Continues to report L hip stifness when getting out of car.   Limitations Walking   How long can you walk comfortably? I'm okay with my walker.   Patient Stated Goals Walk again without a cane.   Currently in Pain? No/denies            [facility] PT Assessment - 01/22/16 0001      Assessment   Medical Diagnosis Displaced    Onset Date/Surgical Date 11/09/15   Next MD Visit 02/03/2016     Precautions   Precautions Fall                     [facility] Adult PT Treatment/Exercise - 01/22/16 0001      Knee/Hip  Exercises: Stretches   Quad Stretch Left;1 rep;60 seconds     Knee/Hip Exercises: Aerobic   Nustep L6 x15 min     Knee/Hip Exercises: Standing   Lateral Step Up Left;3 sets;10 reps;Hand Hold: 2;Step Height: 6"   Forward Step Up Left;3 sets;10 reps;Hand Hold: 2;Step Height: 8"   Rocker Board 3 minutes     Knee/Hip Exercises: Seated   Long Arc Quad Strengthening;Left;3 sets;10 reps;Weights   Long Arc Quad Weight 4 lbs.   Other Seated Knee/Hip Exercises L Hip IR strengthening with red therband x20 reps     Knee/Hip Exercises: Supine   Bridges Limitations 3 x 10 reps   Straight Leg Raises AROM;Left;3 sets;10 reps   Other Supine Knee/Hip Exercises B hip clamshell red theraband x30 reps      Knee/Hip Exercises: Sidelying   Clams LLE clamshell 3x10 reps                  PT Short Term Goals - 01/22/16 0904      PT SHORT TERM GOAL #1   Title Independent with an initial HEP.   Time 2   Period Weeks  Status Achieved           PT Long Term Goals - 01/22/16 0904      PT LONG TERM GOAL #1   Title Independent with an advanced HEP.   Time 4   Period Weeks   Status On-going     PT LONG TERM GOAL #2   Title Increase left hip abduction strength to a solid 4+/5 in order to eliminate her Trendelenburg gait pattern.   Time 4   Period Weeks   Status On-going     PT LONG TERM GOAL #3   Title Walk in clinic with a straight cane 500 feet.   Time 4   Period Weeks   Status New     PT LONG TERM GOAL #4   Title Perform a reciprocating stair gait with one railing.   Time 4   Period Weeks   Status New               Plan - 01/22/16 0953    Clinical Impression Statement Patient continues to progress well with exercises with only report of strain during SLR and L Quad stretch in standing today. Patient noted hip rotation while sitting in recliner and stiffness with L knee flexion at times thus beginning rotation strengthening and Quad stretching. Patient was  educated to try using [facility] for ambulation at this time as strengthening is not at goal status and to give her confidence.    Rehab Potential Excellent   PT Frequency 3x / week   PT Duration 4 weeks   PT Treatment/Interventions ADLs/Self Care Home Management;Electrical Stimulation;Moist Heat;Ultrasound;Gait training;Stair training;Therapeutic activities;Therapeutic exercise;Manual techniques;Patient/family education;Passive range of motion   PT Next Visit Plan Continue with LLE strengthening and balance and gait activities per MPT POC. Use caution due to dizziness.    Consulted and Agree with Plan of Care Patient      Patient will benefit from skilled therapeutic intervention in order to improve the following deficits and impairments:  Pain, Decreased activity tolerance, Decreased strength, Abnormal gait  Visit Diagnosis: Other abnormalities of gait and mobility  Muscle weakness (generalized)     Problem List Patient Active Problem List   Diagnosis Date Noted  . Estrogen deficiency 12/15/2015  . MS (multiple sclerosis) ([place]) 12/15/2015  . Tremor 12/15/2015  . Healthcare maintenance 12/15/2015  . HTN (hypertension)   . Protein-calorie malnutrition, severe 11/09/2015  . Closed left hip fracture ([place]) 11/08/2015  . Bipolar 1 disorder, depressed ([place]) 02/11/2015    [name], PTA 01/22/2016, 9:56 AM  [facility] [address] [place], [place], [address] Phone: [phone]   Fax:  [phone]  Name: Angel Maddox MRN: [id number] Date of Birth: [birth year]

## 2016-01-25 ENCOUNTER — Encounter: Payer: Self-pay | Admitting: Physical Therapy

## 2016-01-25 ENCOUNTER — Ambulatory Visit: Payer: Medicare Other | Admitting: Physical Therapy

## 2016-01-25 ENCOUNTER — Encounter: Payer: Self-pay | Admitting: Neurology

## 2016-01-25 ENCOUNTER — Ambulatory Visit (INDEPENDENT_AMBULATORY_CARE_PROVIDER_SITE_OTHER): Payer: Medicare Other | Admitting: Neurology

## 2016-01-25 VITALS — BP 148/88 | HR 68 | Ht 67.0 in | Wt 140.0 lb

## 2016-01-25 DIAGNOSIS — R251 Tremor, unspecified: Secondary | ICD-10-CM | POA: Diagnosis not present

## 2016-01-25 DIAGNOSIS — R2689 Other abnormalities of gait and mobility: Secondary | ICD-10-CM | POA: Diagnosis not present

## 2016-01-25 DIAGNOSIS — M6281 Muscle weakness (generalized): Secondary | ICD-10-CM

## 2016-01-25 DIAGNOSIS — G35 Multiple sclerosis: Secondary | ICD-10-CM | POA: Diagnosis not present

## 2016-01-25 NOTE — Therapy (Signed)
Angel Maddox, [place], [address] Phone: [phone]   Fax:  [phone]  Physical Therapy Treatment  Patient Details  Name: Angel Maddox MRN: [id number] Date of Birth: [birth year] Referring Provider: Rod Can MD  Encounter Date: 01/25/2016      PT End of Session - 01/25/16 0906    Visit Number 11   Number of Visits 12   Date for PT Re-Evaluation 02/28/16   PT Start Time 0905   PT Stop Time 0945   PT Time Calculation (min) 40 min   Activity Tolerance Patient tolerated treatment well   Behavior During Therapy [facility] for tasks assessed/performed      Past Medical History:  Diagnosis Date  . Anxiety   . Bipolar 1 disorder ([place])   . Chronic kidney disease    kidney stone  . Depression   . H/O hiatal hernia   . Multiple sclerosis ([place])     Had for 15 years  . Tremors of nervous system     Past Surgical History:  Procedure Laterality Date  . HIP PINNING,CANNULATED Left 11/09/2015   Procedure: CANNULATED HIP PINNING;  Surgeon: Rod Can, MD;  Location: [facility];  Service: Orthopedics;  Laterality: Left;  . TUBAL LIGATION      There were no vitals filed for this visit.      Subjective Assessment - 01/25/16 0905    Subjective Reports that her husband let her drive this morning to clinic. Reports that this past saturday was the first time she had driven in 1.5 years. Continues to experience quick zaps of pain intermittantly and has the stiffness with prolonged sitting.   Limitations Walking   How long can you walk comfortably? I'm okay with my walker.   Patient Stated Goals Walk again without a cane.   Currently in Pain? No/denies            [facility] PT Assessment - 01/25/16 0001      Assessment   Medical Diagnosis Displaced    Onset Date/Surgical Date 11/09/15   Next MD Visit 02/03/2016     Precautions   Precautions Fall                     [place] Adult PT Treatment/Exercise  - 01/25/16 0001      Knee/Hip Exercises: Aerobic   Nustep L6 x15 min     Knee/Hip Exercises: Standing   Lateral Step Up Left;3 sets;10 reps;Hand Hold: 2;Step Height: 6"   Forward Step Up Left;3 sets;10 reps;Hand Hold: 2;Step Height: 8"   Rocker Board 3 minutes   Other Standing Knee Exercises L HS curl 4# x30 reps     Knee/Hip Exercises: Seated   Long Arc Quad Strengthening;Left;3 sets;10 reps;Weights   Long Arc Quad Weight 4 lbs.   Other Seated Knee/Hip Exercises B hip IR strengthening red therband x20 reps     Knee/Hip Exercises: Supine   Bridges Limitations 3x15 reps   Straight Leg Raises AROM;Left;3 sets;10 reps   Other Supine Knee/Hip Exercises B hip clamshell red theraband x30 reps      Knee/Hip Exercises: Sidelying   Hip ABduction Strengthening;Left;2 sets;10 reps                PT Education - 01/25/16 0944    Education provided Yes   Education Details HEP- supine clamshell with red theraband   Person(s) Educated Patient   Methods Explanation;Verbal cues;Handout   Comprehension Verbalized understanding;Verbal cues required  PT Short Term Goals - 01/22/16 [id number]      PT SHORT TERM GOAL #1   Title Independent with an initial HEP.   Time 2   Period Weeks   Status Achieved           PT Long Term Goals - 01/22/16 [id number]      PT LONG TERM GOAL #1   Title Independent with an advanced HEP.   Time 4   Period Weeks   Status On-going     PT LONG TERM GOAL #2   Title Increase left hip abduction strength to a solid 4+/5 in order to eliminate her Trendelenburg gait pattern.   Time 4   Period Weeks   Status On-going     PT LONG TERM GOAL #3   Title Walk in clinic with a straight cane 500 feet.   Time 4   Period Weeks   Status New     PT LONG TERM GOAL #4   Title Perform a reciprocating stair gait with one railing.   Time 4   Period Weeks   Status New               Plan - 01/25/16 1023    Clinical Impression Statement Patient  continues to tolerate treatments well with no reports of pain with any exercise. Patient experienced decreased calf tightness today per patient report. Patient continues to demonstrate L hip IR with forward step ups today upon observation. Patient accepted new HEP with red theraband for hip ER strengthening and verbalized understanding.   Rehab Potential Excellent   PT Frequency 3x / week   PT Duration 4 weeks   PT Treatment/Interventions ADLs/Self Care Home Management;Electrical Stimulation;Moist Heat;Ultrasound;Gait training;Stair training;Therapeutic activities;Therapeutic exercise;Manual techniques;Patient/family education;Passive range of motion   PT Next Visit Plan Continue with LLE strengthening and balance and gait activities per MPT POC. Use caution due to dizziness. Increase ankleweights next treatment.   PT Home Exercise Plan HEP- resisted hip ER with red theraband   Consulted and Agree with Plan of Care Patient      Patient will benefit from skilled therapeutic intervention in order to improve the following deficits and impairments:  Pain, Decreased activity tolerance, Decreased strength, Abnormal gait  Visit Diagnosis: Other abnormalities of gait and mobility  Muscle weakness (generalized)     Problem List Patient Active Problem List   Diagnosis Date Noted  . Estrogen deficiency 12/15/2015  . MS (multiple sclerosis) ([place]) 12/15/2015  . Tremor 12/15/2015  . Healthcare maintenance 12/15/2015  . HTN (hypertension)   . Protein-calorie malnutrition, severe 11/09/2015  . Closed left hip fracture ([place]) 11/08/2015  . Bipolar 1 disorder, depressed ([place]) 02/11/2015   Ahmed Prima, PTA 01/25/16 10:27 AM  [facility] [address] [place], [place], [address] Phone: [phone]   Fax:  [phone]  Name: Angel Maddox MRN: [id number] Date of Birth: [birth year]

## 2016-01-25 NOTE — Patient Instructions (Addendum)
Strengthening: Hip Abductor - Resisted    With band looped around both legs above knees, push thighs apart. Repeat __10__ times per set. Do _2-3___ sets per session. Do __2-3__ sessions per day.  http://orth.exer.us/688   Copyright  VHI. All rights reserved.

## 2016-01-25 NOTE — Progress Notes (Signed)
Chart forwarded.  

## 2016-01-25 NOTE — Patient Instructions (Signed)
I would remain on an MS drug, even though you have been doing well for several years.  Discuss options with Dr. Dellis Filbert  I would remain on the primidone, as it is keeping the tremors under relatively good control.  If they should ever get worse, I would recommend adding propranolol in addition to the primidone.

## 2016-01-25 NOTE — Progress Notes (Signed)
NEUROLOGY CONSULTATION NOTE  Angel Maddox MRN: [id number] DOB: 1955/09/01  Referring provider: Dr. Shanon Brow [facility] referral) Primary care provider: Dr. Wendi Snipes  Reason for consult:  MS, tremor  HISTORY OF PRESENT ILLNESS: Angel Maddox is a 60 year old right-handed woman with HTN, anxiety, Bipolar disorder and depression who presents for relapsing-remitting multiple sclerosis.  History obtained by patient, her husband, hospital notes and some of her neurologist's notes.  She was diagnosed with multiple sclerosis about 20 years ago.  She presented with episodes of cognitive clouding followed by fatigue and numbness, burning and tingling in the feet.  She reportedly saw an ophthalmologist who I assume diagnosed papilledema in her left eye, as he told her that she either had a brain tumor or MS.  MRI of brain at that time reportedly showed on lesion on the brain.  She underwent LP, which was reportedly normal.  However, subsequent MRIs revealed progression of white matter lesions.  She would get flare-ups described as numbness involving various areas of the body, which were treated with prednisone tapers.  She has not had a flare-up in several years, ever since starting Rebif.  She continues to have neuropathic pain in the feet radiating up the legs.  Over the past several years, she has developed tremor in the arms and hands, which have progressively gotten worse.  They are fairly well-controlled on primidone 275m three times daily.  Current disease-modifying drug none Past disease-modifying drugs:  Rebif (stopped earlier this year due to elevated LFT (ALT in January was 44), Avonex (ineffective) Tecfidera (rash) Tremor:  Primidone 2524mthree times daily Neuropathic pain: gabapentin 80031mwice daily  Labs from 12/15/15: CBC: WBC 4.3, HGB 13.1, HCT 40.6, PLT 241, absolute lymphocyte count 1.4 LFTs:  TB 0.2, alk phos 125, AST 19, ALT 23  MRI of brain with and without contrast from  03/29/10 showed several nonspecific punctate and patchy white matter changes with involvement of the pons.  No abnormal enhancement (as per report.  Unable to see imaging).  She has an appointment with Dr. JefDellis Filbertis month to discuss alternative disease-modifying treatment.  She was recently hospitalized after left hip fracture sustained when she fell due to dizziness, presumably secondary to polypharmacy.  She was referred to us [place] evaluate for tremor and possibly transition care.  She has no family history of MS.  Her brother has tremor.  PAST MEDICAL HISTORY: Past Medical History:  Diagnosis Date  . Anxiety   . B[place] 1 disorder [place] . Chronic kidney disease    kidney stone  . Depression   . H/O hiatal hernia   . Multiple sclerosis ([place]   Had for 15 years  . Tremors of nervous system     PAST SURGICAL HISTORY: Past Surgical History:  Procedure Laterality Date  . HIP PINNING,CANNULATED Left 11/09/2015   Procedure: CANNULATED HIP PINNING;  Surgeon: BriRod CanD;  Location: [facility];  Service: Orthopedics;  Laterality: Left;  . LIPOMA EXCISION    . TUBAL LIGATION      MEDICATIONS: Current Outpatient Prescriptions on File Prior to Visit  Medication Sig Dispense Refill  . buPROPion (WELLBUTRIN XL) 300 MG 24 hr tablet Take 1 tablet (300 mg total) by mouth daily. 30 tablet 2  . chlorthalidone (HYGROTON) 25 MG tablet Take 1 tablet (25 mg total) by mouth daily. 30 tablet 3  . citalopram (CELEXA) 40 MG tablet Take 1 tablet (40 mg total) by mouth daily with breakfast. 30  tablet 3  . clonazePAM (KLONOPIN) 0.5 MG tablet Take 0.25 mg by mouth.     . feeding supplement, ENSURE ENLIVE, (ENSURE ENLIVE) LIQD Take 237 mLs by mouth 2 (two) times daily between meals. 60 Bottle 0  . gabapentin (NEURONTIN) 400 MG capsule Take 1 capsule (400 mg total) by mouth 3 (three) times daily. (Patient taking differently: Take 800 mg by mouth 3 (three) times daily. ) 90 capsule 0  . Multiple  Vitamin (MULTIVITAMIN WITH MINERALS) TABS Take 1 tablet by mouth daily.    . primidone (MYSOLINE) 250 MG tablet Take 250 mg by mouth 3 (three) times daily.     . risperiDONE (RISPERDAL) 1 MG tablet Take 1 tablet (1 mg total) by mouth every evening. 30 tablet 2   No current facility-administered medications on file prior to visit.     ALLERGIES: No Known Allergies  FAMILY HISTORY: Family History  Problem Relation Age of Onset  . Heart attack Mother     SOCIAL HISTORY: Social History   Social History  . Marital status: Married    Spouse name: N/A  . Number of children: N/A  . Years of education: N/A   Occupational History  . Not on file.   Social History Main Topics  . Smoking status: Former Smoker    Quit date: 01/25/1991  . Smokeless tobacco: Never Used  . Alcohol use Yes     Comment: wine once a month  . Drug use: No  . Sexual activity: Not on file   Other Topics Concern  . Not on file   Social History Narrative  . No narrative on file    REVIEW OF SYSTEMS: Constitutional: No fevers, chills, or sweats, no generalized fatigue, change in appetite Eyes: No visual changes, double vision, eye pain Ear, nose and throat: No hearing loss, ear pain, nasal congestion, sore throat Cardiovascular: No chest pain, palpitations Respiratory:  No shortness of breath at rest or with exertion, wheezes GastrointestinaI: No nausea, vomiting, diarrhea, abdominal pain, fecal incontinence Genitourinary:  No dysuria, urinary retention or frequency Musculoskeletal:  No neck pain, back pain Integumentary: No rash, pruritus, skin lesions Neurological: as above Psychiatric: depression , anxiety Endocrine: No palpitations, fatigue, diaphoresis, mood swings, change in appetite, change in weight, increased thirst Hematologic/Lymphatic:  No purpura, petechiae. Allergic/Immunologic: no itchy/runny eyes, nasal congestion, recent allergic reactions, rashes  PHYSICAL EXAM: Vitals:   01/25/16  1248  BP: (!) 148/88  Pulse: 68   General: No acute distress.  Patient appears well-groomed.  Head:  Normocephalic/atraumatic Eyes:  fundi examined but not visualized Neck: supple, no paraspinal tenderness, full range of motion Back: No paraspinal tenderness Heart: regular rate and rhythm Lungs: Clear to auscultation bilaterally. Vascular: No carotid bruits. Neurological Exam: Mental status: alert and oriented to person, place, and time, recent and remote memory intact, fund of knowledge intact, attention and concentration intact, speech fluent and not dysarthric, language intact. Cranial nerves: CN I: not tested CN II: pupils equal, round and reactive to light, visual fields intact CN III, IV, VI:  full range of motion, no nystagmus, no ptosis CN V: facial sensation intact CN VII: upper and lower face symmetric CN VIII: hearing intact CN IX, X: gag intact, uvula midline CN XI: sternocleidomastoid and trapezius muscles intact CN XII: tongue midline Bulk & Tone: normal, no fasciculations. Motor:  5/5 throughout (did not test left leg due to recent fracture and surgery) Sensation:  Pinprick and vibration sensation intact. Deep Tendon Reflexes:  2+ throughout, toes  downgoing.  Finger to nose testing:  Without dysmetria.  Postural and kinetic tremor bilaterally.  Heel to shin:  Unable to assess Gait:  Antalgic gait with cane due to recent hip fracture.  Romberg negative  IMPRESSION: Relapsing-remitting MS Tremor HTN  PLAN: 1.  She has decided to remain with Dr. Dellis Filbert as she has been with him for many years.  She will discuss alternative disease-modifying treatment with him. 2.  Continue primidone 718m daily.  If tremors should get worse, would consider adding propranolol. 3.  Follow up as needed. 4.  BP mildly elevated.  Follow up with PCP.  Thank you for allowing me to take part in the care of this patient.  AMetta Clines DO  CC:  SKenn File MD

## 2016-01-27 ENCOUNTER — Encounter: Payer: Self-pay | Admitting: Physical Therapy

## 2016-01-27 ENCOUNTER — Ambulatory Visit: Payer: Medicare Other | Attending: Orthopedic Surgery | Admitting: Physical Therapy

## 2016-01-27 DIAGNOSIS — R2689 Other abnormalities of gait and mobility: Secondary | ICD-10-CM

## 2016-01-27 DIAGNOSIS — M6281 Muscle weakness (generalized): Secondary | ICD-10-CM | POA: Diagnosis not present

## 2016-01-27 NOTE — Therapy (Signed)
[facility] [place], [place], [address] Phone: [phone]   Fax:  [phone]  Physical Therapy Treatment  Patient Details  Name: TOPANGA ALVELO MRN: [id number] Date of Birth: 11-16-1955 Referring Provider: Rod Can MD  Encounter Date: 01/27/2016      PT End of Session - 01/27/16 0909    Visit Number 12   Number of Visits 18   Date for PT Re-Evaluation 02/28/16   PT Start Time 0902   PT Stop Time 0945   PT Time Calculation (min) 43 min   Activity Tolerance Patient tolerated treatment well   Behavior During Therapy [facility] for tasks assessed/performed      Past Medical History:  Diagnosis Date  . Anxiety   . Bipolar 1 disorder ([place])   . Chronic kidney disease    kidney stone  . Depression   . H/O hiatal hernia   . Multiple sclerosis ([place])     Had for 15 years  . Tremors of nervous system     Past Surgical History:  Procedure Laterality Date  . HIP PINNING,CANNULATED Left 11/09/2015   Procedure: CANNULATED HIP PINNING;  Surgeon: Rod Can, MD;  Location: [facility];  Service: Orthopedics;  Laterality: Left;  . LIPOMA EXCISION    . TUBAL LIGATION      There were no vitals filed for this visit.      Subjective Assessment - 01/27/16 0909    Subjective Reports that she drove again this morning to clinic but has been struggling this morning in regards to her son.   Limitations Walking   How long can you walk comfortably? I'm okay with my walker.   Patient Stated Goals Walk again without a cane.   Currently in Pain? Other (Comment)  Gave no numerical pain rating            [facility] PT Assessment - 01/27/16 0001      Assessment   Medical Diagnosis Displaced    Onset Date/Surgical Date 11/09/15   Next MD Visit 02/03/2016     Precautions   Precautions Fall                     [place] Adult PT Treatment/Exercise - 01/27/16 0001      Ambulation/Gait   Stairs Yes   Stairs Assistance  6: Modified independent (Device/Increase time)   Stair Management Technique One rail Right;Alternating pattern;Forwards   Number of Stairs 4  x2 RT   Height of Stairs 6     Knee/Hip Exercises: Aerobic   Nustep L6 x15 min     Knee/Hip Exercises: Standing   Lateral Step Up Left;3 sets;10 reps;Hand Hold: 2;Step Height: 6"   Forward Step Up Left;3 sets;10 reps;Hand Hold: 2;Step Height: 8"   Other Standing Knee Exercises L HS curl 5# x20 reps     Knee/Hip Exercises: Seated   Long Arc Quad Strengthening;Left;3 sets;10 reps;Weights   Long Arc Quad Weight 5 lbs.   Other Seated Knee/Hip Exercises B hip IR strengthening green therband x20 reps   Sit to Sand 20 reps;without UE support     Knee/Hip Exercises: Supine   Bridges Limitations 3x15 reps   Straight Leg Raises AROM;Left;3 sets;10 reps   Other Supine Knee/Hip Exercises B hip clamshell green theraband x20 reps    Other Supine Knee/Hip Exercises B hip/knee flexion x20 reps each     Knee/Hip Exercises: Sidelying   Hip ABduction Strengthening;Left;3 sets;10 reps  PT Short Term Goals - 01/22/16 1610      PT SHORT TERM GOAL #1   Title Independent with an initial HEP.   Time 2   Period Weeks   Status Achieved           PT Long Term Goals - 01/27/16 0946      PT LONG TERM GOAL #1   Title Independent with an advanced HEP.   Time 4   Period Weeks   Status On-going     PT LONG TERM GOAL #2   Title Increase left hip abduction strength to a solid 4+/5 in order to eliminate her Trendelenburg gait pattern.   Time 4   Period Weeks   Status On-going     PT LONG TERM GOAL #3   Title Walk in clinic with a straight cane 500 feet.   Time 4   Period Weeks   Status New     PT LONG TERM GOAL #4   Title Perform a reciprocating stair gait with one railing.   Time 4   Period Weeks   Status Partially Met  Mod independent and requires increased time 01/27/2016               Plan - 01/27/16 0947     Clinical Impression Statement Patient tolerated today's treatment fairly well with no reports of increased pain. Patient experienced tightness and weakness in hip with stair ambulation. Patient able to tolerate advancements in resistance with exercises fairly well. Hip rotation continues to be noted with forward step ups.    Rehab Potential Excellent   PT Frequency 3x / week   PT Duration 4 weeks   PT Treatment/Interventions ADLs/Self Care Home Management;Electrical Stimulation;Moist Heat;Ultrasound;Gait training;Stair training;Therapeutic activities;Therapeutic exercise;Manual techniques;Patient/family education;Passive range of motion   PT Next Visit Plan Continue with LLE strengthening and balance and gait activities per MPT POC. Use caution due to dizziness.    PT Home Exercise Plan HEP- resisted hip ER with red theraband   Consulted and Agree with Plan of Care Patient      Patient will benefit from skilled therapeutic intervention in order to improve the following deficits and impairments:  Pain, Decreased activity tolerance, Decreased strength, Abnormal gait  Visit Diagnosis: Other abnormalities of gait and mobility  Muscle weakness (generalized)     Problem List Patient Active Problem List   Diagnosis Date Noted  . Estrogen deficiency 12/15/2015  . MS (multiple sclerosis) ([place]) 12/15/2015  . Tremor 12/15/2015  . Healthcare maintenance 12/15/2015  . HTN (hypertension)   . Protein-calorie malnutrition, severe 11/09/2015  . Closed left hip fracture ([place]) 11/08/2015  . Bipolar 1 disorder, depressed ([place]) 02/11/2015    [name], PTA 01/27/2016, 9:58 AM  [facility] [address] [place], [place], [address] Phone: [phone]   Fax:  [phone]  Name: BRADIE SANGIOVANNI MRN: [id number] Date of Birth: 14-Oct-1955

## 2016-01-29 ENCOUNTER — Ambulatory Visit: Payer: Medicare Other | Admitting: Physical Therapy

## 2016-01-29 ENCOUNTER — Encounter: Payer: Self-pay | Admitting: Physical Therapy

## 2016-01-29 DIAGNOSIS — M6281 Muscle weakness (generalized): Secondary | ICD-10-CM

## 2016-01-29 DIAGNOSIS — R2689 Other abnormalities of gait and mobility: Secondary | ICD-10-CM | POA: Diagnosis not present

## 2016-01-29 NOTE — Therapy (Signed)
[facility] [place], [place], [address] Phone: [phone]   Fax:  [phone]  Physical Therapy Treatment  Patient Details  Name: Angel Maddox MRN: [id number] Date of Birth: February 09, 1956 Referring Provider: Rod Can MD  Encounter Date: 01/29/2016      PT End of Session - 01/29/16 0913    Visit Number 13   Number of Visits 18   Date for PT Re-Evaluation 02/28/16   PT Start Time 0903   PT Stop Time 0945   PT Time Calculation (min) 42 min   Activity Tolerance Patient tolerated treatment well   Behavior During Therapy [facility] for tasks assessed/performed      Past Medical History:  Diagnosis Date  . Anxiety   . Bipolar 1 disorder ([place])   . Chronic kidney disease    kidney stone  . Depression   . H/O hiatal hernia   . Multiple sclerosis ([place])     Had for 15 years  . Tremors of nervous system     Past Surgical History:  Procedure Laterality Date  . HIP PINNING,CANNULATED Left 11/09/2015   Procedure: CANNULATED HIP PINNING;  Surgeon: Rod Can, MD;  Location: [facility];  Service: Orthopedics;  Laterality: Left;  . LIPOMA EXCISION    . TUBAL LIGATION      There were no vitals filed for this visit.      Subjective Assessment - 01/29/16 0911    Subjective Reports that yesterday she had both children (68 and 60 years old) and had to get up and down a lot with them. Reported increased low back pain yesterday but nothing really in her hip.   Limitations Walking   How long can you walk comfortably? I'm okay with my walker.   Patient Stated Goals Walk again without a cane.   Currently in Pain? No/denies            [facility] PT Assessment - 01/29/16 0001      Assessment   Medical Diagnosis Displaced    Onset Date/Surgical Date 11/09/15   Next MD Visit 02/03/2016     Precautions   Precautions Fall                     [place] Adult PT Treatment/Exercise - 01/29/16 0001      Knee/Hip Exercises:  Aerobic   Nustep L6 x15 min     Knee/Hip Exercises: Machines for Strengthening   Cybex Knee Extension 10# 2x10 reps   Cybex Knee Flexion 20# 2x10 reps     Knee/Hip Exercises: Seated   Other Seated Knee/Hip Exercises B hip IR strengthening green therband x30 reps   Sit to Sand 20 reps;without UE support  with core activation     Knee/Hip Exercises: Supine   Bridges Limitations 3x15 reps with core activation   Straight Leg Raises AROM;Left;3 sets;10 reps;Other (comment)  with core activation   Other Supine Knee/Hip Exercises B hip clamshell green theraband x20 reps with core activation   Other Supine Knee/Hip Exercises B hip/knee flexion x20 reps each with core activation     Knee/Hip Exercises: Sidelying   Hip ABduction Strengthening;Left;3 sets;10 reps  with core activation   Clams LLE clamshell 2x10 reps                  PT Short Term Goals - 01/22/16 0904      PT SHORT TERM GOAL #1   Title Independent with an initial HEP.   Time 2  Period Weeks   Status Achieved           PT Long Term Goals - 01/27/16 0946      PT LONG TERM GOAL #1   Title Independent with an advanced HEP.   Time 4   Period Weeks   Status On-going     PT LONG TERM GOAL #2   Title Increase left hip abduction strength to a solid 4+/5 in order to eliminate her Trendelenburg gait pattern.   Time 4   Period Weeks   Status On-going     PT LONG TERM GOAL #3   Title Walk in clinic with a straight cane 500 feet.   Time 4   Period Weeks   Status New     PT LONG TERM GOAL #4   Title Perform a reciprocating stair gait with one railing.   Time 4   Period Weeks   Status Partially Met  Mod independent and requires increased time 01/27/2016               Plan - 01/29/16 1025    Clinical Impression Statement Patient continues to tolerate strengthening very well and able to progress to machine knee strengthening without complaint. Patient experienced low back discomfort yesterday  and core activation was incorporated into LE strengthening exercises to strengthen low back and core as well as LE. Patient was educated to keep using [facility] until MD visit so he can see her progression. Patient inquired regarding walking a local track with a small area of incline and she was educated to ask MD at next visit to ensure his approval.  Patient continues to experience weakness with L hip abduction and ER. Patient was educated to complete HEPs with core activation to continue lumbar strengthening as well.   Rehab Potential Excellent   PT Frequency 3x / week   PT Duration 4 weeks   PT Treatment/Interventions ADLs/Self Care Home Management;Electrical Stimulation;Moist Heat;Ultrasound;Gait training;Stair training;Therapeutic activities;Therapeutic exercise;Manual techniques;Patient/family education;Passive range of motion   PT Next Visit Plan Continue with LLE strengthening and balance and gait activities per MPT POC. Use caution due to dizziness. MD note next treatment.   PT Home Exercise Plan HEP- resisted hip ER with red theraband   Consulted and Agree with Plan of Care Patient      Patient will benefit from skilled therapeutic intervention in order to improve the following deficits and impairments:  Pain, Decreased activity tolerance, Decreased strength, Abnormal gait  Visit Diagnosis: Other abnormalities of gait and mobility  Muscle weakness (generalized)     Problem List Patient Active Problem List   Diagnosis Date Noted  . Estrogen deficiency 12/15/2015  . MS (multiple sclerosis) ([place]) 12/15/2015  . Tremor 12/15/2015  . Healthcare maintenance 12/15/2015  . HTN (hypertension)   . Protein-calorie malnutrition, severe 11/09/2015  . Closed left hip fracture ([place]) 11/08/2015  . Bipolar 1 disorder, depressed ([place]) 02/11/2015    [name], PTA 01/29/2016, 10:31 AM  [facility] [address] [place], [place], [address] Phone:  [phone]   Fax:  [phone]  Name: Angel Maddox MRN: [id number] Date of Birth: Jun 01, 1956

## 2016-02-01 ENCOUNTER — Encounter: Payer: Self-pay | Admitting: Physical Therapy

## 2016-02-01 ENCOUNTER — Ambulatory Visit: Payer: Medicare Other | Admitting: Physical Therapy

## 2016-02-01 DIAGNOSIS — M6281 Muscle weakness (generalized): Secondary | ICD-10-CM

## 2016-02-01 DIAGNOSIS — R2689 Other abnormalities of gait and mobility: Secondary | ICD-10-CM | POA: Diagnosis not present

## 2016-02-01 NOTE — Therapy (Signed)
[facility] [place], [place], [address] Phone: [phone]   Fax:  [phone]  Physical Therapy Treatment  Patient Details  Name: Angel Maddox MRN: [id number] Date of Birth: Mar 29, 1956 Referring Provider: Rod Can MD  Encounter Date: 02/01/2016      PT End of Session - 02/01/16 0900    Visit Number 14   Number of Visits 18   Date for PT Re-Evaluation 02/28/16   PT Start Time 0902   PT Stop Time 0947   PT Time Calculation (min) 45 min   Activity Tolerance Patient tolerated treatment well   Behavior During Therapy [facility] for tasks assessed/performed      Past Medical History:  Diagnosis Date  . Anxiety   . Bipolar 1 disorder ([place])   . Chronic kidney disease    kidney stone  . Depression   . H/O hiatal hernia   . Multiple sclerosis ([place])     Had for 15 years  . Tremors of nervous system     Past Surgical History:  Procedure Laterality Date  . HIP PINNING,CANNULATED Left 11/09/2015   Procedure: CANNULATED HIP PINNING;  Surgeon: Rod Can, MD;  Location: [facility];  Service: Orthopedics;  Laterality: Left;  . LIPOMA EXCISION    . TUBAL LIGATION      There were no vitals filed for this visit.      Subjective Assessment - 02/01/16 0900    Subjective Reports that she continues to have neck/shoulder discomfort with use of SPC. Also reports that she could not stand long yesterday with grandson due to LBP.   Limitations Walking   How long can you walk comfortably? I'm okay with my walker.   Patient Stated Goals Walk again without a cane.   Currently in Pain? No/denies            [facility] PT Assessment - 02/01/16 0001      Assessment   Medical Diagnosis Displaced    Onset Date/Surgical Date 11/09/15   Next MD Visit 02/03/2016     Precautions   Precautions Fall     ROM / Strength   AROM / PROM / Strength Strength     Strength   Strength Assessment Site Hip   Right/Left Hip Left   Left Hip  External Rotation 4/5   Left Hip ABduction 4+/5                     [facility] Adult PT Treatment/Exercise - 02/01/16 0001      Knee/Hip Exercises: Stretches   Other Knee/Hip Stretches L adductor stretch 3*30 sec holds     Knee/Hip Exercises: Aerobic   Nustep L6 x15 min     Knee/Hip Exercises: Machines for Strengthening   Cybex Knee Extension 10# 2x10 reps   Cybex Knee Flexion 20# 2x10 reps     Knee/Hip Exercises: Seated   Other Seated Knee/Hip Exercises B hip IR strengthening green therband x30 reps   Sit to Sand 20 reps;without UE support     Knee/Hip Exercises: Supine   Bridges Limitations 4x10 reps with core activation and airex under B feet   Straight Leg Raises Strengthening;Left;3 sets;10 reps  with core activation   Other Supine Knee/Hip Exercises B hip clamshell green theraband x30 reps with core activation   Other Supine Knee/Hip Exercises B hip/knee flexion x40 reps each with core activation     Knee/Hip Exercises: Sidelying   Hip ABduction Strengthening;Left;3 sets;10 reps   Clams LLE  clamshell 3x10 reps                  PT Short Term Goals - 01/22/16 2440      PT SHORT TERM GOAL #1   Title Independent with an initial HEP.   Time 2   Period Weeks   Status Achieved           PT Long Term Goals - 02/01/16 1027      PT LONG TERM GOAL #1   Title Independent with an advanced HEP.   Time 4   Period Weeks   Status Achieved     PT LONG TERM GOAL #2   Title Increase left hip abduction strength to a solid 4+/5 in order to eliminate her Trendelenburg gait pattern.   Time 4   Period Weeks   Status On-going  4/5-4+/5 L hip abduction and hip ER respectively as of 02/01/2016     PT LONG TERM GOAL #3   Title Walk in clinic with a straight cane 500 feet.   Time 4   Period Weeks   Status Achieved  Patient using SPC with community distances at all times unless in kitchen area 02/01/2016     PT LONG TERM GOAL #4   Title Perform a  reciprocating stair gait with one railing.   Time 4   Period Weeks   Status Partially Met  Mod independent and requires increased time 01/27/2016             Patient will benefit from skilled therapeutic intervention in order to improve the following deficits and impairments:     Visit Diagnosis: Other abnormalities of gait and mobility  Muscle weakness (generalized)     Problem List Patient Active Problem List   Diagnosis Date Noted  . Estrogen deficiency 12/15/2015  . MS (multiple sclerosis) ([place]) 12/15/2015  . Tremor 12/15/2015  . Healthcare maintenance 12/15/2015  . HTN (hypertension)   . Protein-calorie malnutrition, severe 11/09/2015  . Closed left hip fracture ([place]) 11/08/2015  . Bipolar 1 disorder, depressed ([place]) 02/11/2015    Ahmed Prima, PTA 02/01/16 9:55 AM    Laureen Abrahams, PT, DPT 02/01/16 12:40 PM   [facility] [address] [place], [place], [address] Phone: [phone]   Fax:  [phone]  Name: CANAAN PRUE MRN: [id number] Date of Birth: 1956/02/21

## 2016-02-03 DIAGNOSIS — S72042D Displaced fracture of base of neck of left femur, subsequent encounter for closed fracture with routine healing: Secondary | ICD-10-CM | POA: Diagnosis not present

## 2016-02-03 DIAGNOSIS — Z8659 Personal history of other mental and behavioral disorders: Secondary | ICD-10-CM | POA: Diagnosis not present

## 2016-02-04 ENCOUNTER — Ambulatory Visit: Payer: Medicare Other | Admitting: Physical Therapy

## 2016-02-04 ENCOUNTER — Encounter: Payer: Self-pay | Admitting: Physical Therapy

## 2016-02-04 DIAGNOSIS — R2689 Other abnormalities of gait and mobility: Secondary | ICD-10-CM

## 2016-02-04 DIAGNOSIS — M6281 Muscle weakness (generalized): Secondary | ICD-10-CM | POA: Diagnosis not present

## 2016-02-04 NOTE — Therapy (Signed)
[facility] [place], [place], [address] Phone: [phone]   Fax:  [phone]  Physical Therapy Treatment  Patient Details  Name: Angel Maddox MRN: [id number] Date of Birth: [birth year] Referring Provider: Rod Can MD  Encounter Date: 02/04/2016      PT End of Session - 02/04/16 0908    Visit Number 15   Number of Visits 36   Date for PT Re-Evaluation 04/03/16  Per NO by MD signed on 02/03/2016   PT Start Time 0905   PT Stop Time 0948   PT Time Calculation (min) 43 min   Activity Tolerance Patient tolerated treatment well   Behavior During Therapy [facility] for tasks assessed/performed      Past Medical History:  Diagnosis Date  . Anxiety   . Bipolar 1 disorder ([place])   . Chronic kidney disease    kidney stone  . Depression   . H/O hiatal hernia   . Multiple sclerosis ([place])     Had for 15 years  . Tremors of nervous system     Past Surgical History:  Procedure Laterality Date  . HIP PINNING,CANNULATED Left 11/09/2015   Procedure: CANNULATED HIP PINNING;  Surgeon: Rod Can, MD;  Location: [facility];  Service: Orthopedics;  Laterality: Left;  . LIPOMA EXCISION    . TUBAL LIGATION      There were no vitals filed for this visit.      Subjective Assessment - 02/04/16 0906    Subjective Reports that the MD worried that her abductors are still weak and wants her to ride the stationary bike for 30 minutes 3x/ week. Reports that she doesn't go back to see MD until November 2017. Reports that MD made her abductors sore as he pushed on them.   Limitations Walking   How long can you walk comfortably? I'm okay with my walker.   Patient Stated Goals Walk again without a cane.   Currently in Pain? Yes   Pain Score 5    Pain Location Hip   Pain Orientation Left;Lateral   Pain Descriptors / Indicators Sore            [facility] PT Assessment - 02/04/16 0001      Assessment   Medical Diagnosis Displaced     Onset Date/Surgical Date 11/09/15   Next MD Visit 04/2016     Precautions   Precautions Fall                     [place] Adult PT Treatment/Exercise - 02/04/16 0001      Knee/Hip Exercises: Aerobic   Stationary Bike L1 x15 min  intermittant sharp pains in L hip     Knee/Hip Exercises: Machines for Strengthening   Cybex Knee Extension 10# 3x10 reps   Cybex Knee Flexion 20# 3x10 reps     Knee/Hip Exercises: Standing   Hip Abduction Stengthening;Left;3 sets;10 reps;Knee straight   Abduction Limitations Yellow theraband   Lateral Step Up Left;2 sets;10 reps;Hand Hold: 2;Step Height: 8"     Knee/Hip Exercises: Supine   Bridges Limitations Green theraband resisted bridge 2x10 reps   Straight Leg Raises Strengthening;Left;3 sets;10 reps   Straight Leg Raises Limitations with core activation   Other Supine Knee/Hip Exercises B hip clamshell green theraband x30 reps with core activation   Other Supine Knee/Hip Exercises B hip/knee flexion x20 reps each with core activation     Knee/Hip Exercises: Sidelying   Hip ABduction Strengthening;Left;3 sets;10 reps  PT Education - 02/04/16 [id number]    Education provided Yes   Education Details HEP- resisted standing hip abduction with yellow theraband   Person(s) Educated Patient   Methods Explanation;Demonstration;Verbal cues;Handout   Comprehension Verbalized understanding;Returned demonstration;Verbal cues required          PT Short Term Goals - 01/22/16 0904      PT SHORT TERM GOAL #1   Title Independent with an initial HEP.   Time 2   Period Weeks   Status Achieved           PT Long Term Goals - 02/01/16 0354      PT LONG TERM GOAL #1   Title Independent with an advanced HEP.   Time 4   Period Weeks   Status Achieved     PT LONG TERM GOAL #2   Title Increase left hip abduction strength to a solid 4+/5 in order to eliminate her Trendelenburg gait pattern.   Time 4   Period Weeks    Status On-going  4/5-4+/5 L hip abduction and hip ER respectively as of 02/01/2016     PT LONG TERM GOAL #3   Title Walk in clinic with a straight cane 500 feet.   Time 4   Period Weeks   Status Achieved  Patient using SPC with community distances at all times unless in kitchen area 02/01/2016     PT LONG TERM GOAL #4   Title Perform a reciprocating stair gait with one railing.   Time 4   Period Weeks   Status Partially Met  Mod independent and requires increased time 01/27/2016               Plan - 02/04/16 0956    Clinical Impression Statement Patient arrived to treatment disappointed in the news she recieved at MD visit this week. Today's treatment was focused on increasing hip abductors specifically. Patient experienced intermittant sharp pain on stationary bike on L1 per patient report. Patient was educated regarding new exercise for HEP and technique as well as the parameters. Patient continues to complete supine exercises with core activation at this time to increase core strength. Patient able to progress with machine strengthening repititions.   Rehab Potential Excellent   PT Frequency 3x / week   PT Duration 4 weeks   PT Treatment/Interventions ADLs/Self Care Home Management;Electrical Stimulation;Moist Heat;Ultrasound;Gait training;Stair training;Therapeutic activities;Therapeutic exercise;Manual techniques;Patient/family education;Passive range of motion   PT Next Visit Plan Continue with LLE strengthening and balance and gait activities per MPT POC. Specifically strength L hip abductors per MD report by patient.   PT Home Exercise Plan HEP- resisted hip ER with red theraband; standing resisted L hip abduction with yellow theraband   Consulted and Agree with Plan of Care Patient      Patient will benefit from skilled therapeutic intervention in order to improve the following deficits and impairments:  Pain, Decreased activity tolerance, Decreased strength, Abnormal  gait  Visit Diagnosis: Other abnormalities of gait and mobility  Muscle weakness (generalized)     Problem List Patient Active Problem List   Diagnosis Date Noted  . Estrogen deficiency 12/15/2015  . MS (multiple sclerosis) ([place]) 12/15/2015  . Tremor 12/15/2015  . Healthcare maintenance 12/15/2015  . HTN (hypertension)   . Protein-calorie malnutrition, severe 11/09/2015  . Closed left hip fracture ([place]) 11/08/2015  . Bipolar 1 disorder, depressed ([place]) 02/11/2015    [name], PTA 02/04/2016, 10:02 AM  [facility] [address]  [place], [place], [address] Phone: [phone]   Fax:  [phone]  Name: GENAVIE BOETTGER MRN: [id number] Date of Birth: October 24, 1955

## 2016-02-04 NOTE — Patient Instructions (Addendum)
Strengthening: Hip Abduction - Resisted    With tubing around both legs, and bring left leg out to the side. Repeat _10___ times per set. Do __2-3__ sets per session. Do __3-4__ sessions per day.  http://orth.exer.us/634   Copyright  VHI. All rights reserved.

## 2016-02-05 ENCOUNTER — Ambulatory Visit: Payer: Medicare Other | Admitting: Physical Therapy

## 2016-02-05 ENCOUNTER — Encounter: Payer: Self-pay | Admitting: Physical Therapy

## 2016-02-05 DIAGNOSIS — M6281 Muscle weakness (generalized): Secondary | ICD-10-CM | POA: Diagnosis not present

## 2016-02-05 DIAGNOSIS — R2689 Other abnormalities of gait and mobility: Secondary | ICD-10-CM

## 2016-02-05 NOTE — Therapy (Signed)
[facility] [place], [place], [address] Phone: [phone]   Fax:  [phone]  Physical Therapy Treatment  Patient Details  Name: Angel Maddox MRN: [id number] Date of Birth: 05-16-1956 Referring Provider: Rod Can MD  Encounter Date: 02/05/2016      PT End of Session - 02/05/16 0902    Visit Number 16   Number of Visits 36   Date for PT Re-Evaluation 04/03/16   PT Start Time 0900   PT Stop Time 0948   PT Time Calculation (min) 48 min   Activity Tolerance Patient tolerated treatment well   Behavior During Therapy [facility] for tasks assessed/performed      Past Medical History:  Diagnosis Date  . Anxiety   . Bipolar 1 disorder ([place])   . Chronic kidney disease    kidney stone  . Depression   . H/O hiatal hernia   . Multiple sclerosis ([place])     Had for 15 years  . Tremors of nervous system     Past Surgical History:  Procedure Laterality Date  . HIP PINNING,CANNULATED Left 11/09/2015   Procedure: CANNULATED HIP PINNING;  Surgeon: Rod Can, MD;  [facility];  Service: Orthopedics;  Laterality: Left;  . LIPOMA EXCISION    . TUBAL LIGATION      There were no vitals filed for this visit.      Subjective Assessment - 02/05/16 0901    Subjective Reports that she had some pains walking into the gym area today but otherwise she reports no pain.   Limitations Walking   How long can you walk comfortably? I'm okay with my walker.   Patient Stated Goals Walk again without a cane.   Currently in Pain? No/denies            [facility] PT Assessment - 02/05/16 0001      Assessment   Medical Diagnosis Displaced    Onset Date/Surgical Date 11/09/15   Next MD Visit 04/2016     Precautions   Precautions Fall                     [place] Adult PT Treatment/Exercise - 02/05/16 0001      Knee/Hip Exercises: Stretches   Hip Flexor Stretch Left;1 rep;60 seconds     Knee/Hip Exercises:  Aerobic   Stationary Bike L1 x15 min  No sharp pains while on stationary bike today     Knee/Hip Exercises: Machines for Strengthening   Cybex Knee Extension 10# 3x10 reps   Cybex Knee Flexion 20# 3x10 reps     Knee/Hip Exercises: Standing   Hip Abduction Stengthening;Left;3 sets;10 reps;Knee straight   Abduction Limitations 3#   Lateral Step Up Left;2 sets;10 reps;Hand Hold: 2;Step Height: 6"     Knee/Hip Exercises: Supine   Bridges Limitations Green theraband resisted bridge 2x10 reps   Straight Leg Raises Strengthening;Left;3 sets;10 reps  "strain" in anterior L hip   Other Supine Knee/Hip Exercises B hip clamshell green theraband x30 reps with core activation   Other Supine Knee/Hip Exercises B hip/knee flexion x30 reps each with core activation with green theraband     Knee/Hip Exercises: Sidelying   Hip ABduction Strengthening;Left;3 sets;10 reps     Manual Therapy   Manual Therapy Myofascial release   Myofascial Release L lateral hip scar mobilization in R sidelying to decrease scar tissue that may be present  PT Education - 02/04/16 [id number]    Education provided Yes   Education Details HEP- resisted standing hip abduction with yellow theraband   Person(s) Educated Patient   Methods Explanation;Demonstration;Verbal cues;Handout   Comprehension Verbalized understanding;Returned demonstration;Verbal cues required          PT Short Term Goals - 01/22/16 0904      PT SHORT TERM GOAL #1   Title Independent with an initial HEP.   Time 2   Period Weeks   Status Achieved           PT Long Term Goals - 02/01/16 7408      PT LONG TERM GOAL #1   Title Independent with an advanced HEP.   Time 4   Period Weeks   Status Achieved     PT LONG TERM GOAL #2   Title Increase left hip abduction strength to a solid 4+/5 in order to eliminate her Trendelenburg gait pattern.   Time 4   Period Weeks   Status On-going  4/5-4+/5 L hip abduction and  hip ER respectively as of 02/01/2016     PT LONG TERM GOAL #3   Title Walk in clinic with a straight cane 500 feet.   Time 4   Period Weeks   Status Achieved  Patient using SPC with community distances at all times unless in kitchen area 02/01/2016     PT LONG TERM GOAL #4   Title Perform a reciprocating stair gait with one railing.   Time 4   Period Weeks   Status Partially Met  Mod independent and requires increased time 01/27/2016               Plan - 02/05/16 0955    Clinical Impression Statement Patient arrived to treatment with intermittant sharp pains with ambulation but no other pain per patient report. Patient continues to demonstrate weakness in hip flexors as well as hip abductors. Patient had increased difficulty with lateral step ups to 6" step today. Patient required instruction on standing hip flexor stretch due to tightness and strain reported with SLR. Patient continues to experience weakness with sidelying hip abduction as well. Scar mobilization completed to L lateral hip incision to break up any scar tissue that may be present. Patient denied pain following today's treatment.   Rehab Potential Excellent   PT Frequency 3x / week   PT Duration 4 weeks   PT Treatment/Interventions ADLs/Self Care Home Management;Electrical Stimulation;Moist Heat;Ultrasound;Gait training;Stair training;Therapeutic activities;Therapeutic exercise;Manual techniques;Patient/family education;Passive range of motion   PT Next Visit Plan Continue with LLE strengthening and balance and gait activities per MPT POC. Specifically strength L hip abductors per MD report by patient. Attempt hip stretching prior to supine strengthening next treatment.   PT Home Exercise Plan HEP- resisted hip ER with red theraband; standing resisted L hip abduction with yellow theraband   Consulted and Agree with Plan of Care Patient      Patient will benefit from skilled therapeutic intervention in order to improve  the following deficits and impairments:  Pain, Decreased activity tolerance, Decreased strength, Abnormal gait  Visit Diagnosis: Other abnormalities of gait and mobility  Muscle weakness (generalized)     Problem List Patient Active Problem List   Diagnosis Date Noted  . Estrogen deficiency 12/15/2015  . MS (multiple sclerosis) ([place]) 12/15/2015  . Tremor 12/15/2015  . Healthcare maintenance 12/15/2015  . HTN (hypertension)   . Protein-calorie malnutrition, severe 11/09/2015  . Closed left hip fracture ([place]) 11/08/2015  .  Bipolar 1 disorder, depressed ([place]) 02/11/2015    [name], PTA 02/05/2016, 10:01 AM  [facility] [address] [place], [place], [address] Phone: [phone]   Fax:  [phone]  Name: ALTHERIA SHADOAN MRN: [id number] Date of Birth: Jun 01, 1956

## 2016-02-08 ENCOUNTER — Ambulatory Visit: Payer: Medicare Other | Admitting: Physical Therapy

## 2016-02-08 ENCOUNTER — Encounter: Payer: Self-pay | Admitting: Physical Therapy

## 2016-02-08 DIAGNOSIS — M6281 Muscle weakness (generalized): Secondary | ICD-10-CM

## 2016-02-08 DIAGNOSIS — R2689 Other abnormalities of gait and mobility: Secondary | ICD-10-CM | POA: Diagnosis not present

## 2016-02-08 NOTE — Therapy (Signed)
[facility] [place], [place], [address] Phone: [phone]   Fax:  [phone]  Physical Therapy Treatment  Patient Details  Name: Angel Maddox MRN: [id number] Date of Birth: Sep 24, 1955 Referring Provider: Rod Can MD  Encounter Date: 02/08/2016      PT End of Session - 02/08/16 0929    Visit Number 17   Number of Visits 36   Date for PT Re-Evaluation 04/03/16   PT Start Time 0900   PT Stop Time 0945   PT Time Calculation (min) 45 min   Activity Tolerance Patient tolerated treatment well   Behavior During Therapy [facility] for tasks assessed/performed      Past Medical History:  Diagnosis Date  . Anxiety   . Bipolar 1 disorder ([place])   . Chronic kidney disease    kidney stone  . Depression   . H/O hiatal hernia   . Multiple sclerosis ([place])     Had for 15 years  . Tremors of nervous system     Past Surgical History:  Procedure Laterality Date  . HIP PINNING,CANNULATED Left 11/09/2015   Procedure: CANNULATED HIP PINNING;  Surgeon: Rod Can, MD;  Location: [facility];  Service: Orthopedics;  Laterality: Left;  . LIPOMA EXCISION    . TUBAL LIGATION      There were no vitals filed for this visit.      Subjective Assessment - 02/08/16 0913    Subjective Patient has reported no pain aftr last treatment and no pain upon arrival   Limitations Walking   How long can you walk comfortably? I'm okay with my walker.   Patient Stated Goals Walk again without a cane.   Currently in Pain? No/denies                         [facility] Adult PT Treatment/Exercise - 02/08/16 0001      Knee/Hip Exercises: Aerobic   Stationary Bike L2 x15 min     Knee/Hip Exercises: Machines for Strengthening   Cybex Knee Extension 10# 3x10 reps   Cybex Knee Flexion 20# 3x10 reps     Knee/Hip Exercises: Standing   Lateral Step Up Left;2 sets;10 reps;Step Height: 4";Step Height: 6"   Forward Step Up Left;2 sets;10  reps;Step Height: 4"     Knee/Hip Exercises: Supine   Bridges Limitations Green theraband resisted bridge 2x10 reps   Straight Leg Raises Strengthening;Left;3 sets;10 reps     Knee/Hip Exercises: Sidelying   Hip ABduction Strengthening;Left;3 sets;10 reps   Clams with red t-band 2x10                  PT Short Term Goals - 01/22/16 9476      PT SHORT TERM GOAL #1   Title Independent with an initial HEP.   Time 2   Period Weeks   Status Achieved           PT Long Term Goals - 02/01/16 5465      PT LONG TERM GOAL #1   Title Independent with an advanced HEP.   Time 4   Period Weeks   Status Achieved     PT LONG TERM GOAL #2   Title Increase left hip abduction strength to a solid 4+/5 in order to eliminate her Trendelenburg gait pattern.   Time 4   Period Weeks   Status On-going  4/5-4+/5 L hip abduction and hip ER respectively as of 02/01/2016  PT LONG TERM GOAL #3   Title Walk in clinic with a straight cane 500 feet.   Time 4   Period Weeks   Status Achieved  Patient using SPC with community distances at all times unless in kitchen area 02/01/2016     PT LONG TERM GOAL #4   Title Perform a reciprocating stair gait with one railing.   Time 4   Period Weeks   Status Partially Met  Mod independent and requires increased time 01/27/2016               Plan - 02/08/16 0947    Clinical Impression Statement Patient progressing with all activities with no pain complaints only some fatigue. Patient tolerated treatment well and progressed with hip strengthening exercises. Patient has reported difficulty with putting all weight on left LE and performing stairs. Patient goals ongoing due to strength deficits.   Rehab Potential Excellent   PT Frequency 3x / week   PT Duration 4 weeks   PT Treatment/Interventions ADLs/Self Care Home Management;Electrical Stimulation;Moist Heat;Ultrasound;Gait training;Stair training;Therapeutic activities;Therapeutic  exercise;Manual techniques;Patient/family education;Passive range of motion   PT Next Visit Plan Continue with LLE strengthening and balance and gait activities per MPT POC. Specifically strength L hip abductors per MD report by patient. Follow up with MD in november      Patient will benefit from skilled therapeutic intervention in order to improve the following deficits and impairments:  Pain, Decreased activity tolerance, Decreased strength, Abnormal gait  Visit Diagnosis: Other abnormalities of gait and mobility  Muscle weakness (generalized)     Problem List Patient Active Problem List   Diagnosis Date Noted  . Estrogen deficiency 12/15/2015  . MS (multiple sclerosis) ([place]) 12/15/2015  . Tremor 12/15/2015  . Healthcare maintenance 12/15/2015  . HTN (hypertension)   . Protein-calorie malnutrition, severe 11/09/2015  . Closed left hip fracture ([place]) 11/08/2015  . Bipolar 1 disorder, depressed ([place]) 02/11/2015    Tabitha Riggins P, PTA 02/08/2016, 10:01 AM  [facility] [place], [place], [address] Phone: [phone]   Fax:  [phone]  Name: Angel Maddox MRN: [id number] Date of Birth: 01-21-1956

## 2016-02-10 ENCOUNTER — Ambulatory Visit: Payer: Medicare Other | Admitting: Physical Therapy

## 2016-02-10 ENCOUNTER — Encounter: Payer: Self-pay | Admitting: Physical Therapy

## 2016-02-10 DIAGNOSIS — M6281 Muscle weakness (generalized): Secondary | ICD-10-CM

## 2016-02-10 DIAGNOSIS — R2689 Other abnormalities of gait and mobility: Secondary | ICD-10-CM | POA: Diagnosis not present

## 2016-02-10 NOTE — Therapy (Signed)
[facility] [place], [place], [address] Phone: [phone]   Fax:  [phone]  Physical Therapy Treatment  Patient Details  Name: Angel Maddox MRN: [id number] Date of Birth: 08/30/1955 Referring Provider: Rod Can MD  Encounter Date: 02/10/2016      PT End of Session - 02/10/16 0938    Visit Number 18   Number of Visits 36   Date for PT Re-Evaluation 04/03/16   PT Start Time 0859   PT Stop Time 0943   PT Time Calculation (min) 44 min   Activity Tolerance Patient tolerated treatment well   Behavior During Therapy [facility] for tasks assessed/performed      Past Medical History:  Diagnosis Date  . Anxiety   . Bipolar 1 disorder ([place])   . Chronic kidney disease    kidney stone  . Depression   . H/O hiatal hernia   . Multiple sclerosis ([place])     Had for 15 years  . Tremors of nervous system     Past Surgical History:  Procedure Laterality Date  . HIP PINNING,CANNULATED Left 11/09/2015   Procedure: CANNULATED HIP PINNING;  Surgeon: Rod Can, MD;  [facility]: [facility];  Service: Orthopedics;  Laterality: Left;  . LIPOMA EXCISION    . TUBAL LIGATION      There were no vitals filed for this visit.      Subjective Assessment - 02/10/16 0903    Subjective Patient reported no soreness or pain after last treatment and no pain today. Only c/o difficulty with getting out of car at times   Limitations Walking   How long can you walk comfortably? I'm okay with my walker.   Patient Stated Goals Walk again without a cane.   Currently in Pain? No/denies                         [facility] Adult PT Treatment/Exercise - 02/10/16 0001      Knee/Hip Exercises: Aerobic   Stationary Bike L2 x15 min     Knee/Hip Exercises: Machines for Strengthening   Cybex Knee Extension 10# 3x10 reps   Cybex Knee Flexion 20# 3x10 reps     Knee/Hip Exercises: Standing   Lateral Step Up Left;3 sets;10 reps;Step  Height: 6"   Forward Step Up Left;3 sets;10 reps;Step Height: 6"     Knee/Hip Exercises: Supine   Bridges Limitations Green theraband resisted bridge 2x10 reps   Straight Leg Raises Strengthening;Left;3 sets;10 reps     Knee/Hip Exercises: Sidelying   Hip ABduction Strengthening;Left;3 sets;10 reps   Clams with red t-band 3x10                  PT Short Term Goals - 01/22/16 1103      PT SHORT TERM GOAL #1   Title Independent with an initial HEP.   Time 2   Period Weeks   Status Achieved           PT Long Term Goals - 02/01/16 1594      PT LONG TERM GOAL #1   Title Independent with an advanced HEP.   Time 4   Period Weeks   Status Achieved     PT LONG TERM GOAL #2   Title Increase left hip abduction strength to a solid 4+/5 in order to eliminate her Trendelenburg gait pattern.   Time 4   Period Weeks   Status On-going  4/5-4+/5 L hip abduction and hip  ER respectively as of 02/01/2016     PT LONG TERM GOAL #3   Title Walk in clinic with a straight cane 500 feet.   Time 4   Period Weeks   Status Achieved  Patient using SPC with community distances at all times unless in kitchen area 02/01/2016     PT LONG TERM GOAL #4   Title Perform a reciprocating stair gait with one railing.   Time 4   Period Weeks   Status Partially Met  Mod independent and requires increased time 01/27/2016               Plan - 02/10/16 0941    Clinical Impression Statement Patient progressing with all activities today and able to increase reps on exercises today. Patient had no pain reports just some fatigue. Patient has some difficulty with stairs and getting out of car at times due to a initial pain with first few steps when staning up. Goals ongoing due to strength deficits.   Rehab Potential Excellent   PT Frequency 3x / week   PT Duration 4 weeks   PT Treatment/Interventions ADLs/Self Care Home Management;Electrical Stimulation;Moist Heat;Ultrasound;Gait training;Stair  training;Therapeutic activities;Therapeutic exercise;Manual techniques;Patient/family education;Passive range of motion   PT Next Visit Plan Continue with LLE strengthening and balance and gait activities per MPT POC. Specifically strength L hip abductors per MD report by patient. Follow up with MD in november   Consulted and Agree with Plan of Care Patient      Patient will benefit from skilled therapeutic intervention in order to improve the following deficits and impairments:  Pain, Decreased activity tolerance, Decreased strength, Abnormal gait  Visit Diagnosis: Other abnormalities of gait and mobility  Muscle weakness (generalized)     Problem List Patient Active Problem List   Diagnosis Date Noted  . Estrogen deficiency 12/15/2015  . MS (multiple sclerosis) ([place]) 12/15/2015  . Tremor 12/15/2015  . Healthcare maintenance 12/15/2015  . HTN (hypertension)   . Protein-calorie malnutrition, severe 11/09/2015  . Closed left hip fracture ([place]) 11/08/2015  . Bipolar 1 disorder, depressed ([place]) 02/11/2015    ,  P, PTA 02/10/2016, 9:46 AM  [facility] [place], [place], [address] Phone: [phone]   Fax:  [phone]  Name: STALEY BUDZINSKI MRN: [id number] Date of Birth: Jan 18, 1956

## 2016-02-11 ENCOUNTER — Encounter: Payer: Self-pay | Admitting: Physical Therapy

## 2016-02-12 ENCOUNTER — Encounter: Payer: Self-pay | Admitting: *Deleted

## 2016-02-12 DIAGNOSIS — Z23 Encounter for immunization: Secondary | ICD-10-CM | POA: Diagnosis not present

## 2016-02-12 DIAGNOSIS — D72819 Decreased white blood cell count, unspecified: Secondary | ICD-10-CM | POA: Diagnosis not present

## 2016-02-12 DIAGNOSIS — G35 Multiple sclerosis: Secondary | ICD-10-CM | POA: Diagnosis not present

## 2016-02-12 DIAGNOSIS — F339 Major depressive disorder, recurrent, unspecified: Secondary | ICD-10-CM | POA: Diagnosis not present

## 2016-02-16 ENCOUNTER — Encounter: Payer: Self-pay | Admitting: *Deleted

## 2016-02-17 ENCOUNTER — Ambulatory Visit: Payer: Medicare Other | Admitting: Physical Therapy

## 2016-02-17 ENCOUNTER — Encounter: Payer: Self-pay | Admitting: Physical Therapy

## 2016-02-17 DIAGNOSIS — R2689 Other abnormalities of gait and mobility: Secondary | ICD-10-CM

## 2016-02-17 DIAGNOSIS — M6281 Muscle weakness (generalized): Secondary | ICD-10-CM | POA: Diagnosis not present

## 2016-02-17 NOTE — Therapy (Signed)
[facility] [place], [place], [address] Phone: [phone]   Fax:  [phone]  Physical Therapy Treatment  Patient Details  Name: Angel Maddox MRN: [id number] Date of Birth: 1955/11/07 Referring Provider: Rod Can MD  Encounter Date: 02/17/2016      PT End of Session - 02/17/16 0922    Visit Number 19   Number of Visits 36   Date for PT Re-Evaluation 04/03/16   PT Start Time 0901   PT Stop Time 0942   PT Time Calculation (min) 41 min   Activity Tolerance Patient tolerated treatment well;Patient limited by fatigue  limited with weakness   Behavior During Therapy [facility] for tasks assessed/performed      Past Medical History:  Diagnosis Date  . Anxiety   . Bipolar 1 disorder ([place])   . Chronic kidney disease    kidney stone  . Depression   . H/O hiatal hernia   . Multiple sclerosis ([place])     Had for 15 years  . Tremors of nervous system     Past Surgical History:  Procedure Laterality Date  . HIP PINNING,CANNULATED Left 11/09/2015   Procedure: CANNULATED HIP PINNING;  Surgeon: Rod Can, MD;  Location: [facility];  Service: Orthopedics;  Laterality: Left;  . LIPOMA EXCISION    . TUBAL LIGATION      There were no vitals filed for this visit.      Subjective Assessment - 02/17/16 0903    Subjective Patient has some soreness today and more weak than usual may be due to a lot of standing and activity with a birthday party and a death in family   Limitations Walking   How long can you walk comfortably? I'm okay with my walker.   Patient Stated Goals Walk again without a cane.   Currently in Pain? Yes   Pain Score 4    Pain Location Hip   Pain Orientation Left;Lateral   Pain Descriptors / Indicators Sore   Aggravating Factors  increased activity   Pain Relieving Factors at rest                         [facility] Adult PT Treatment/Exercise - 02/17/16 0001      Knee/Hip Exercises:  Aerobic   Stationary Bike L1-2 x15 min     Knee/Hip Exercises: Machines for Strengthening   Cybex Knee Extension 10# x10 reps   Cybex Knee Flexion 20# 3x10 reps     Knee/Hip Exercises: Seated   Other Seated Knee/Hip Exercises hip IR/ER with red t-band 2 x 10 each     Knee/Hip Exercises: Supine   Hip Adduction Isometric Strengthening;Left;2 sets;10 reps  using ball for squeeze   Bridges Limitations Green theraband resisted bridge 2x10 reps   Straight Leg Raises Strengthening;Left;10 reps;2 sets     Knee/Hip Exercises: Sidelying   Clams with red t-band 2x10                  PT Short Term Goals - 01/22/16 9323      PT SHORT TERM GOAL #1   Title Independent with an initial HEP.   Time 2   Period Weeks   Status Achieved           PT Long Term Goals - 02/01/16 5573      PT LONG TERM GOAL #1   Title Independent with an advanced HEP.   Time 4   Period Weeks  Status Achieved     PT LONG TERM GOAL #2   Title Increase left hip abduction strength to a solid 4+/5 in order to eliminate her Trendelenburg gait pattern.   Time 4   Period Weeks   Status On-going  4/5-4+/5 L hip abduction and hip ER respectively as of 02/01/2016     PT LONG TERM GOAL #3   Title Walk in clinic with a straight cane 500 feet.   Time 4   Period Weeks   Status Achieved  Patient using SPC with community distances at all times unless in kitchen area 02/01/2016     PT LONG TERM GOAL #4   Title Perform a reciprocating stair gait with one railing.   Time 4   Period Weeks   Status Partially Met  Mod independent and requires increased time 01/27/2016               Plan - 02/17/16 0940    Clinical Impression Statement Patient tolerated treatment well yet was fatigue and weak today. Patient was unable to complete standing activities today. Patient would like to progress to gait training with no assistive device when feling stronger. Patient goals ongoing at this time due to strength  deficits.   Rehab Potential Excellent   PT Frequency 3x / week   PT Duration 4 weeks   PT Treatment/Interventions ADLs/Self Care Home Management;Electrical Stimulation;Moist Heat;Ultrasound;Gait training;Stair training;Therapeutic activities;Therapeutic exercise;Manual techniques;Patient/family education;Passive range of motion   PT Next Visit Plan Continue with LLE strengthening and balance and gait activities per MPT POC. Specifically strength L hip abductors per MD report by patient. Follow up with MD in november   Consulted and Agree with Plan of Care Patient      Patient will benefit from skilled therapeutic intervention in order to improve the following deficits and impairments:  Pain, Decreased activity tolerance, Decreased strength, Abnormal gait  Visit Diagnosis: Other abnormalities of gait and mobility  Muscle weakness (generalized)     Problem List Patient Active Problem List   Diagnosis Date Noted  . Estrogen deficiency 12/15/2015  . MS (multiple sclerosis) ([place]) 12/15/2015  . Tremor 12/15/2015  . Healthcare maintenance 12/15/2015  . HTN (hypertension)   . Protein-calorie malnutrition, severe 11/09/2015  . Closed left hip fracture ([place]) 11/08/2015  . Bipolar 1 disorder, depressed ([place]) 02/11/2015    DUNFORD, CHRISTINA P, PTA 02/17/2016, 9:49 AM  [facility] [place], [place], [address] Phone: [phone]   Fax:  [phone]  Name: DONYELL CARRELL MRN: [id number] Date of Birth: [birth year]

## 2016-02-19 ENCOUNTER — Ambulatory Visit: Payer: Medicare Other | Admitting: Physical Therapy

## 2016-02-19 DIAGNOSIS — R2689 Other abnormalities of gait and mobility: Secondary | ICD-10-CM

## 2016-02-19 DIAGNOSIS — M6281 Muscle weakness (generalized): Secondary | ICD-10-CM | POA: Diagnosis not present

## 2016-02-19 NOTE — Therapy (Signed)
[facility] [place], [place], [address] Phone: [phone]   Fax:  [phone]  Physical Therapy Treatment  Patient Details  Name: Angel Maddox MRN: [id number] Date of Birth: Dec 20, 1955 Referring Provider: Rod Can MD  Encounter Date: 02/19/2016      PT End of Session - 02/19/16 1112    Visit Number 20   Number of Visits 36   Date for PT Re-Evaluation 04/03/16   PT Start Time 0901   PT Stop Time 0952   PT Time Calculation (min) 51 min   Activity Tolerance Patient tolerated treatment well;Patient limited by fatigue   Behavior During Therapy [facility] for tasks assessed/performed      Past Medical History:  Diagnosis Date  . Anxiety   . Bipolar 1 disorder ([place])   . Chronic kidney disease    kidney stone  . Depression   . H/O hiatal hernia   . Multiple sclerosis ([place])     Had for 15 years  . Tremors of nervous system     Past Surgical History:  Procedure Laterality Date  . HIP PINNING,CANNULATED Left 11/09/2015   Procedure: CANNULATED HIP PINNING;  Surgeon: Rod Can, MD;  Location: [facility];  Service: Orthopedics;  Laterality: Left;  . LIPOMA EXCISION    . TUBAL LIGATION      There were no vitals filed for this visit.      Subjective Assessment - 02/19/16 1023    Subjective My hip feels sore and weak today.   Pain Score 4    Pain Location Hip   Pain Orientation Right;Lateral   Pain Descriptors / Indicators Sore                         [facility] Adult PT Treatment/Exercise - 02/19/16 0001      Exercises   Exercises Knee/Hip     Knee/Hip Exercises: Aerobic   Stationary Bike L2 x 15 minutes.     Knee/Hip Exercises: Machines for Strengthening   Cybex Knee Extension 10# x 2 minutes.   Cybex Knee Flexion 30# x 2 minutes.     Knee/Hip Exercises: Standing   Lateral Step Up Limitations 6 inch x 2 minutes.   Forward Step Up Limitations 6 inch x 2 minutes.     Modalities    Modalities Electrical Stimulation;Moist Heat     Moist Heat Therapy   Number Minutes Moist Heat 15 Minutes   Moist Heat Location --  Left hip.     Acupuncturist Location --  Left lateral hip.   Electrical Stimulation Action Pre-mod constant at 80-150 Hz x 15 minutes.   Electrical Stimulation Goals Pain                  PT Short Term Goals - 01/22/16 0904      PT SHORT TERM GOAL #1   Title Independent with an initial HEP.   Time 2   Period Weeks   Status Achieved           PT Long Term Goals - 02/19/16 1109      PT LONG TERM GOAL #1   Title Independent with an advanced HEP.   Time 4   Period Weeks   Status Achieved     PT LONG TERM GOAL #2   Title Increase left hip abduction strength to a solid 4+/5 in order to eliminate her Trendelenburg gait pattern.   Time 4  Period Weeks   Status On-going     PT LONG TERM GOAL #3   Title Walk in clinic with a straight cane 500 feet.   Time 4   Period Weeks   Status Achieved     PT LONG TERM GOAL #4   Title Perform a reciprocating stair gait with one railing.   Time 4   Period Weeks   Status Partially Met               Plan - 2016-03-15 1058    Clinical Impression Statement Patient having a rough day and was tired and felt weak.  She reported soreness around her left hip incisional site.   PT Frequency 3x / week   PT Duration 4 weeks   PT Treatment/Interventions ADLs/Self Care Home Management;Electrical Stimulation;Moist Heat;Ultrasound;Gait training;Stair training;Therapeutic activities;Therapeutic exercise;Manual techniques;Patient/family education;Passive range of motion      Patient will benefit from skilled therapeutic intervention in order to improve the following deficits and impairments:  Pain, Decreased activity tolerance, Decreased strength, Abnormal gait  Visit Diagnosis: Other abnormalities of gait and mobility  Muscle weakness (generalized)        G-Codes - 15-Mar-2016 1057    Functional Assessment Tool Used Clinical judgement.   Functional Limitation Mobility: Walking and moving around   Mobility: Walking and Moving Around Current Status [phone]) At least 40 percent but less than 60 percent impaired, limited or restricted   Mobility: Walking and Moving Around Goal Status [phone]) At least 1 percent but less than 20 percent impaired, limited or restricted      Problem List Patient Active Problem List   Diagnosis Date Noted  . Estrogen deficiency 12/15/2015  . MS (multiple sclerosis) ([place]) 12/15/2015  . Tremor 12/15/2015  . Healthcare maintenance 12/15/2015  . HTN (hypertension)   . Protein-calorie malnutrition, severe 11/09/2015  . Closed left hip fracture ([place]) 11/08/2015  . Bipolar 1 disorder, depressed ([place]) 02/11/2015    Gala Padovano, [place] MPT 03/15/16, 11:14 AM  [facility] [place], [place], [address] Phone: [phone]   Fax:  [phone]  Name: Angel Maddox MRN: [id number] Date of Birth: April 30, 1956

## 2016-02-22 ENCOUNTER — Ambulatory Visit: Payer: Medicare Other | Admitting: Physical Therapy

## 2016-02-22 ENCOUNTER — Encounter: Payer: Self-pay | Admitting: Physical Therapy

## 2016-02-22 DIAGNOSIS — R2689 Other abnormalities of gait and mobility: Secondary | ICD-10-CM | POA: Diagnosis not present

## 2016-02-22 DIAGNOSIS — M6281 Muscle weakness (generalized): Secondary | ICD-10-CM

## 2016-02-22 NOTE — Therapy (Signed)
[facility] [place], [place], [address] Phone: [phone]   Fax:  [phone]  Physical Therapy Treatment  Patient Details  Name: Angel Maddox MRN: [id number] Date of Birth: 05-05-1956 Referring Provider: Rod Can MD  Encounter Date: 02/22/2016      PT End of Session - 02/22/16 0909    Visit Number 21   Number of Visits 36   Date for PT Re-Evaluation 04/03/16   PT Start Time 0903   PT Stop Time 0949   PT Time Calculation (min) 46 min   Activity Tolerance Patient tolerated treatment well;Patient limited by fatigue   Behavior During Therapy [facility] for tasks assessed/performed      Past Medical History:  Diagnosis Date  . Anxiety   . Bipolar 1 disorder ([place])   . Chronic kidney disease    kidney stone  . Depression   . H/O hiatal hernia   . Multiple sclerosis ([place])     Had for 15 years  . Tremors of nervous system     Past Surgical History:  Procedure Laterality Date  . HIP PINNING,CANNULATED Left 11/09/2015   Procedure: CANNULATED HIP PINNING;  Surgeon: Rod Can, MD;  Location: [facility];  Service: Orthopedics;  Laterality: Left;  . LIPOMA EXCISION    . TUBAL LIGATION      There were no vitals filed for this visit.      Subjective Assessment - 02/22/16 0908    Subjective Reports that the electrodes and heat helped with the scar tissue. Reports that she doesn't know why her hip felt so weak all of a sudden. Reports that she had discomfort when getting into her car as normal.   Limitations Walking   How long can you walk comfortably? I'm okay with my walker.   Patient Stated Goals Walk again without a cane.   Currently in Pain? No/denies            [facility] PT Assessment - 02/22/16 0001      Assessment   Medical Diagnosis Displaced    Onset Date/Surgical Date 11/09/15   Next MD Visit 04/2016     Precautions   Precautions Fall                     [facility] Adult PT  Treatment/Exercise - 02/22/16 0001      Knee/Hip Exercises: Aerobic   Stationary Bike L2 x 15 minutes.     Knee/Hip Exercises: Machines for Strengthening   Cybex Knee Extension 10# 3x10 reps   Cybex Knee Flexion 30# 3x10 reps     Knee/Hip Exercises: Supine   Bridges with Clamshell Strengthening;Both;2 sets;10 reps  green theraband   Straight Leg Raises Strengthening;Left;3 sets;10 reps     Knee/Hip Exercises: Sidelying   Hip ABduction Strengthening;Left;3 sets;10 reps     Modalities   Modalities Electrical Stimulation;Moist Heat     Moist Heat Therapy   Number Minutes Moist Heat 15 Minutes   Moist Heat Location Hip     Electrical Stimulation   Electrical Stimulation Location L lateral hip   Electrical Stimulation Action Pre-Mod   Electrical Stimulation Parameters 80-150 hz x15 min   Electrical Stimulation Goals Pain                  PT Short Term Goals - 01/22/16 1660      PT SHORT TERM GOAL #1   Title Independent with an initial HEP.   Time 2   Period Weeks  Status Achieved           PT Long Term Goals - 02/19/16 1109      PT LONG TERM GOAL #1   Title Independent with an advanced HEP.   Time 4   Period Weeks   Status Achieved     PT LONG TERM GOAL #2   Title Increase left hip abduction strength to a solid 4+/5 in order to eliminate her Trendelenburg gait pattern.   Time 4   Period Weeks   Status On-going     PT LONG TERM GOAL #3   Title Walk in clinic with a straight cane 500 feet.   Time 4   Period Weeks   Status Achieved     PT LONG TERM GOAL #4   Title Perform a reciprocating stair gait with one railing.   Time 4   Period Weeks   Status Partially Met               Plan - 02/22/16 0935    Clinical Impression Statement Patient presented in clinic with no current pain and SPC for ambulation. Patient had no complaints with exercises other than difficulty and tightness with seated HS curl with 30#. Patient continues to have  difficulty hip abduction and SLR with education to rest as she needed to. Normal modalities response noted following removal of the modalities. Goals remain on-going secondary to strength deficits.    Rehab Potential Excellent   PT Frequency 3x / week   PT Duration 4 weeks   PT Treatment/Interventions ADLs/Self Care Home Management;Electrical Stimulation;Moist Heat;Ultrasound;Gait training;Stair training;Therapeutic activities;Therapeutic exercise;Manual techniques;Patient/family education;Passive range of motion   PT Next Visit Plan Continue with LLE strengthening and balance and gait activities per MPT POC. Specifically strength L hip abductors per MD report by patient.   PT Home Exercise Plan HEP- resisted hip ER with red theraband; standing resisted L hip abduction with yellow theraband   Consulted and Agree with Plan of Care Patient      Patient will benefit from skilled therapeutic intervention in order to improve the following deficits and impairments:  Pain, Decreased activity tolerance, Decreased strength, Abnormal gait  Visit Diagnosis: Other abnormalities of gait and mobility  Muscle weakness (generalized)     Problem List Patient Active Problem List   Diagnosis Date Noted  . Estrogen deficiency 12/15/2015  . MS (multiple sclerosis) ([place]) 12/15/2015  . Tremor 12/15/2015  . Healthcare maintenance 12/15/2015  . HTN (hypertension)   . Protein-calorie malnutrition, severe 11/09/2015  . Closed left hip fracture ([place]) 11/08/2015  . Bipolar 1 disorder, depressed ([place]) 02/11/2015    [name] 02/22/2016, 9:53 AM  [facility] [address] [place], [place], [address] Phone: [phone]   Fax:  [phone]  Name: Angel Maddox MRN: [id number] Date of Birth: 02/26/1956

## 2016-02-24 ENCOUNTER — Encounter: Payer: Self-pay | Admitting: Physical Therapy

## 2016-02-24 ENCOUNTER — Ambulatory Visit: Payer: Medicare Other | Admitting: Physical Therapy

## 2016-02-24 DIAGNOSIS — R2689 Other abnormalities of gait and mobility: Secondary | ICD-10-CM | POA: Diagnosis not present

## 2016-02-24 DIAGNOSIS — M6281 Muscle weakness (generalized): Secondary | ICD-10-CM

## 2016-02-24 NOTE — Therapy (Signed)
[facility] [place], [place], [address] Phone: [phone]   Fax:  [phone]  Physical Therapy Treatment  Patient Details  Name: Angel Maddox MRN: [id number] Date of Birth: 1956/03/26 Referring Provider: Rod Can MD  Encounter Date: 02/24/2016      PT End of Session - 02/24/16 0857    Visit Number 22   Number of Visits 36   Date for PT Re-Evaluation 04/03/16   PT Start Time 0902   PT Stop Time 0949   PT Time Calculation (min) 47 min   Activity Tolerance Patient tolerated treatment well   Behavior During Therapy [facility] for tasks assessed/performed      Past Medical History:  Diagnosis Date  . Anxiety   . Bipolar 1 disorder ([place])   . Chronic kidney disease    kidney stone  . Depression   . H/O hiatal hernia   . Multiple sclerosis ([place])     Had for 15 years  . Tremors of nervous system     Past Surgical History:  Procedure Laterality Date  . HIP PINNING,CANNULATED Left 11/09/2015   Procedure: CANNULATED HIP PINNING;  Surgeon: Rod Can, MD;  Location: [facility];  Service: Orthopedics;  Laterality: Left;  . LIPOMA EXCISION    . TUBAL LIGATION      There were no vitals filed for this visit.      Subjective Assessment - 02/24/16 0857    Subjective Reports that she is going to the beach and asked if she will be able to go without her cane. Reports that she uses it mostly in the mornings due to balance issues but feels confident without it at home. Reports that both of her legs feels weak this morning. Reports tightness in anterior hip and that dizziness has returned.   Limitations Walking   How long can you walk comfortably? I'm okay with my walker.   Patient Stated Goals Walk again without a cane.   Currently in Pain? No/denies            [facility] PT Assessment - 02/24/16 0001      Assessment   Medical Diagnosis Displaced    Onset Date/Surgical Date 11/09/15   Next MD Visit 04/2016     Precautions   Precautions Fall                     [facility] Adult PT Treatment/Exercise - 02/24/16 0001      Knee/Hip Exercises: Stretches   Hip Flexor Stretch Left;1 rep;60 seconds     Knee/Hip Exercises: Aerobic   Stationary Bike L2 x 12 minutes.     Knee/Hip Exercises: Machines for Strengthening   Cybex Knee Extension 10# 3x10 reps   Cybex Knee Flexion 30# 3x10 reps     Knee/Hip Exercises: Supine   Bridges with Clamshell Strengthening;Both;2 sets;10 reps  green theraband   Straight Leg Raises Strengthening;Left;3 sets;10 reps     Knee/Hip Exercises: Sidelying   Hip ABduction Strengthening;Left;3 sets;10 reps     Modalities   Modalities Electrical Stimulation;Moist Heat     Moist Heat Therapy   Number Minutes Moist Heat 15 Minutes   Moist Heat Location Hip     Electrical Stimulation   Electrical Stimulation Location L anteriolateral hip   Electrical Stimulation Action Pre-Mod   Electrical Stimulation Parameters 80-150 hz x15 min   Electrical Stimulation Goals Pain  PT Short Term Goals - 01/22/16 9030      PT SHORT TERM GOAL #1   Title Independent with an initial HEP.   Time 2   Period Weeks   Status Achieved           PT Long Term Goals - 02/19/16 1109      PT LONG TERM GOAL #1   Title Independent with an advanced HEP.   Time 4   Period Weeks   Status Achieved     PT LONG TERM GOAL #2   Title Increase left hip abduction strength to a solid 4+/5 in order to eliminate her Trendelenburg gait pattern.   Time 4   Period Weeks   Status On-going     PT LONG TERM GOAL #3   Title Walk in clinic with a straight cane 500 feet.   Time 4   Period Weeks   Status Achieved     PT LONG TERM GOAL #4   Title Perform a reciprocating stair gait with one railing.   Time 4   Period Weeks   Status Partially Met               Plan - 02/24/16 0934    Clinical Impression Statement Patient presented in clinic with  reports of B leg weakness and dizziness. Patient was educated that if she felt comfortable without AD she could not use it but if she felt weak or dizzy to use AD to prevent fall or if she is on uneven ground. Patient continues to experience weakness with L hip abduction exercises. Patient completed hip flexor stretch in standing today secondary to hip tightness sensation. Normal modalities response noted following removal of the modalities. Goal regarding L hip abduction strength remains on-going secondary to weakness that is still present. Patient was also educated that she may have to continue her vestibular exercises again.   Rehab Potential Excellent   PT Frequency 3x / week   PT Duration 4 weeks   PT Treatment/Interventions ADLs/Self Care Home Management;Electrical Stimulation;Moist Heat;Ultrasound;Gait training;Stair training;Therapeutic activities;Therapeutic exercise;Manual techniques;Patient/family education;Passive range of motion   PT Next Visit Plan Continue with LLE strengthening and balance and gait activities per MPT POC. Specifically strength L hip abductors per MD report by patient.   PT Home Exercise Plan HEP- resisted hip ER with red theraband; standing resisted L hip abduction with yellow theraband   Consulted and Agree with Plan of Care Patient      Patient will benefit from skilled therapeutic intervention in order to improve the following deficits and impairments:  Pain, Decreased activity tolerance, Decreased strength, Abnormal gait  Visit Diagnosis: Other abnormalities of gait and mobility  Muscle weakness (generalized)     Problem List Patient Active Problem List   Diagnosis Date Noted  . Estrogen deficiency 12/15/2015  . MS (multiple sclerosis) ([place]) 12/15/2015  . Tremor 12/15/2015  . Healthcare maintenance 12/15/2015  . HTN (hypertension)   . Protein-calorie malnutrition, severe 11/09/2015  . Closed left hip fracture ([place]) 11/08/2015  . Bipolar 1 disorder,  depressed ([place]) 02/11/2015    Angel Maddox, PTA 02/24/2016, 9:55 AM  [facility] [address] [place], [place], [address] Phone: [phone]   Fax:  [phone]  Name: Angel Maddox MRN: [id number] Date of Birth: 08-01-1955

## 2016-02-26 ENCOUNTER — Ambulatory Visit: Payer: Medicare Other | Attending: Orthopedic Surgery | Admitting: Physical Therapy

## 2016-02-26 ENCOUNTER — Encounter: Payer: Self-pay | Admitting: Physical Therapy

## 2016-02-26 DIAGNOSIS — M6281 Muscle weakness (generalized): Secondary | ICD-10-CM | POA: Diagnosis not present

## 2016-02-26 DIAGNOSIS — R2689 Other abnormalities of gait and mobility: Secondary | ICD-10-CM | POA: Insufficient documentation

## 2016-02-26 NOTE — Therapy (Signed)
[facility] [place], [place], [address] Phone: [phone]   Fax:  [phone]  Physical Therapy Treatment  Patient Details  Name: Angel Maddox MRN: [id number] Date of Birth: 1956/03/13 Referring Provider: Rod Can MD  Encounter Date: 02/26/2016      PT End of Session - 02/26/16 0905    Visit Number 23   Number of Visits 36   Date for PT Re-Evaluation 04/03/16   PT Start Time 0903   PT Stop Time 0946   PT Time Calculation (min) 43 min   Activity Tolerance Patient tolerated treatment well   Behavior During Therapy [facility] for tasks assessed/performed      Past Medical History:  Diagnosis Date  . Anxiety   . Bipolar 1 disorder ([place])   . Chronic kidney disease    kidney stone  . Depression   . H/O hiatal hernia   . Multiple sclerosis ([place])     Had for 15 years  . Tremors of nervous system     Past Surgical History:  Procedure Laterality Date  . HIP PINNING,CANNULATED Left 11/09/2015   Procedure: CANNULATED HIP PINNING;  Surgeon: Rod Can, MD;  Location: [facility];  Service: Orthopedics;  Laterality: Left;  . LIPOMA EXCISION    . TUBAL LIGATION      There were no vitals filed for this visit.      Subjective Assessment - 02/26/16 0904    Subjective Reports that she is ver dizzy this morning and upon waking could hardly walk with her cane. Reports that she has done her vestibular exercises each day but has gotten worse since Wednesday.   Limitations Walking   How long can you walk comfortably? I'm okay with my walker.   Patient Stated Goals Walk again without a cane.   Currently in Pain? No/denies            [facility] PT Assessment - 02/26/16 0001      Assessment   Medical Diagnosis Displaced    Onset Date/Surgical Date 11/09/15   Next MD Visit 04/2016     Precautions   Precautions Fall                     [facility] Adult PT Treatment/Exercise - 02/26/16 0001      Knee/Hip  Exercises: Aerobic   Stationary Bike L2 x 16 minutes.     Knee/Hip Exercises: Machines for Strengthening   Cybex Knee Extension 10# 3x10 reps   Cybex Knee Flexion 30# 3x10 reps     Knee/Hip Exercises: Standing   Hip Flexion Stengthening;Left;2 sets;10 reps;Knee bent   Hip Flexion Limitations 4#   Hip Abduction Stengthening;Left;2 sets;10 reps;Knee straight   Abduction Limitations 4#   Hip Extension Stengthening;Left;2 sets;10 reps;Knee straight   Extension Limitations 4#   Lateral Step Up Left;2 sets;10 reps;Hand Hold: 2;Step Height: 6"   Forward Step Up Left;3 sets;10 reps;Hand Hold: 2;Step Height: 6"   Other Standing Knee Exercises Sidestepping green theraband 5 ft x2 RT     Knee/Hip Exercises: Seated   Clamshell with TheraBand Green  1x10 reps                  PT Short Term Goals - 01/22/16 9244      PT SHORT TERM GOAL #1   Title Independent with an initial HEP.   Time 2   Period Weeks   Status Achieved           PT  Long Term Goals - 02/19/16 1109      PT LONG TERM GOAL #1   Title Independent with an advanced HEP.   Time 4   Period Weeks   Status Achieved     PT LONG TERM GOAL #2   Title Increase left hip abduction strength to a solid 4+/5 in order to eliminate her Trendelenburg gait pattern.   Time 4   Period Weeks   Status On-going     PT LONG TERM GOAL #3   Title Walk in clinic with a straight cane 500 feet.   Time 4   Period Weeks   Status Achieved     PT LONG TERM GOAL #4   Title Perform a reciprocating stair gait with one railing.   Time 4   Period Weeks   Status Partially Met               Plan - 02/26/16 0950    Clinical Impression Statement Patient presented in clinic today with increased dizziness reports than she had on Wednesday per patient report. Patient was educated to continue vestibular exercises that MD gave her and to be careful with PT HEP she was given as she has to turn on her sides and supine. Patient  continues to do well with Quad and HS strengthening with machine strengthening and forward step up to 6" step. Patient had no reports of weakness with forward step up but had weakness reports with lateral step ups. Hip IR noted with both forward and lateral step up to 6" step today. Patient also reported weakness with seated hip ER with green theraband today per patient report.   Rehab Potential Excellent   PT Frequency 3x / week   PT Duration 4 weeks   PT Treatment/Interventions ADLs/Self Care Home Management;Electrical Stimulation;Moist Heat;Ultrasound;Gait training;Stair training;Therapeutic activities;Therapeutic exercise;Manual techniques;Patient/family education;Passive range of motion   PT Next Visit Plan Continue with LLE strengthening and balance and gait activities per MPT POC. Specifically strength L hip abductors per MD report by patient.   PT Home Exercise Plan HEP- resisted hip ER with red theraband; standing resisted L hip abduction with yellow theraband   Consulted and Agree with Plan of Care Patient      Patient will benefit from skilled therapeutic intervention in order to improve the following deficits and impairments:  Pain, Decreased activity tolerance, Decreased strength, Abnormal gait  Visit Diagnosis: Other abnormalities of gait and mobility  Muscle weakness (generalized)     Problem List Patient Active Problem List   Diagnosis Date Noted  . Estrogen deficiency 12/15/2015  . MS (multiple sclerosis) ([place]) 12/15/2015  . Tremor 12/15/2015  . Healthcare maintenance 12/15/2015  . HTN (hypertension)   . Protein-calorie malnutrition, severe 11/09/2015  . Closed left hip fracture ([place]) 11/08/2015  . Bipolar 1 disorder, depressed ([place]) 02/11/2015    [name], PTA 02/26/2016, 9:56 AM  [facility] [address] [place], [place], [address] Phone: [phone]   Fax:  [phone]  Name: Angel Maddox MRN:  [id number] Date of Birth: [birth year]

## 2016-03-09 ENCOUNTER — Other Ambulatory Visit: Payer: Self-pay | Admitting: Family Medicine

## 2016-03-14 ENCOUNTER — Encounter: Payer: Self-pay | Admitting: Physical Therapy

## 2016-03-14 ENCOUNTER — Ambulatory Visit: Payer: Medicare Other | Admitting: Physical Therapy

## 2016-03-14 DIAGNOSIS — M6281 Muscle weakness (generalized): Secondary | ICD-10-CM

## 2016-03-14 DIAGNOSIS — R2689 Other abnormalities of gait and mobility: Secondary | ICD-10-CM

## 2016-03-14 NOTE — Therapy (Signed)
[facility] [place], [place], [address] Phone: [phone]   Fax:  [phone]  Physical Therapy Treatment  Patient Details  Name: Angel Maddox MRN: [id number] Date of Birth: 1955-11-19 Referring Provider: Rod Can MD  Encounter Date: 03/14/2016      PT End of Session - 03/14/16 0903    Visit Number 24   Number of Visits 36   Date for PT Re-Evaluation 04/03/16   PT Start Time 0901   PT Stop Time 0941   PT Time Calculation (min) 40 min   Activity Tolerance Patient tolerated treatment well   Behavior During Therapy [facility] for tasks assessed/performed      Past Medical History:  Diagnosis Date  . Anxiety   . Bipolar 1 disorder ([place])   . Chronic kidney disease    kidney stone  . Depression   . H/O hiatal hernia   . Multiple sclerosis ([place])     Had for 15 years  . Tremors of nervous system     Past Surgical History:  Procedure Laterality Date  . HIP PINNING,CANNULATED Left 11/09/2015   Procedure: CANNULATED HIP PINNING;  Surgeon: Angel Can, MD;  [facility];  Service: Orthopedics;  Laterality: Left;  . LIPOMA EXCISION    . TUBAL LIGATION      There were no vitals filed for this visit.      Subjective Assessment - 03/14/16 0903    Subjective Reports that she may be weak today secondary to not doing exercise since last time in PT. Reports that she had both her grandchildren yesterday so her low back is sore. Reports that her dizziness is now better. Reports that she walked while at the beach for up to 15 minutes before pain set in.   Limitations Walking   How long Maddox you walk comfortably? I'Angel okay with my walker.   Patient Stated Goals Walk again without a cane.   Currently in Pain? No/denies            [facility] PT Assessment - 03/14/16 0001      Assessment   Medical Diagnosis Displaced    Onset Date/Surgical Date 11/09/15   Next MD Visit 04/2016     Precautions   Precautions Fall                      [facility] Adult PT Treatment/Exercise - 03/14/16 0001      Knee/Hip Exercises: Aerobic   Stationary Bike L2 x 16 minutes.     Knee/Hip Exercises: Machines for Strengthening   Cybex Knee Extension 10# 2x10 reps   Cybex Knee Flexion 30# 3x10 reps     Knee/Hip Exercises: Standing   Hip Flexion Stengthening;Left;3 sets;10 reps;Knee bent   Hip Flexion Limitations 3#   Hip Abduction Stengthening;Left;2 sets;10 reps;Knee straight   Abduction Limitations 3#   Hip Extension Stengthening;Left;3 sets;10 reps;Knee straight   Extension Limitations 3#   Functional Squat 2 sets;10 reps   Functional Squat Limitations with green theraband donned to knees   Other Standing Knee Exercises Sidestepping green theraband 5 ft x5 RT     Knee/Hip Exercises: Seated   Other Seated Knee/Hip Exercises L hip IR strengthening green theraband 2x10 reps   Sit to Sand 10 reps;without UE support  limited secondary to B Quad strenuous feel     Knee/Hip Exercises: Supine   Bridges Limitations 2x10 reps   Straight Leg Raises Strengthening;Left;3 sets;10 reps     Knee/Hip Exercises:  Sidelying   Clams LLE in R sidelying 3x10 reps                  PT Short Term Goals - 01/22/16 0904      PT SHORT TERM GOAL #1   Title Independent with an initial HEP.   Time 2   Period Weeks   Status Achieved           PT Long Term Goals - 02/19/16 1109      PT LONG TERM GOAL #1   Title Independent with an advanced HEP.   Time 4   Period Weeks   Status Achieved     PT LONG TERM GOAL #2   Title Increase left hip abduction strength to a solid 4+/5 in order to eliminate her Trendelenburg gait pattern.   Time 4   Period Weeks   Status On-going     PT LONG TERM GOAL #3   Title Walk in clinic with a straight cane 500 feet.   Time 4   Period Weeks   Status Achieved     PT LONG TERM GOAL #4   Title Perform a reciprocating stair gait with one railing.   Time 4   Period Weeks    Status Partially Met               Plan - 03/14/16 0942    Clinical Impression Statement Patient arrived to treatment following 2 weeks off for a vacation and reports she did not complete exercises while she was gone. Patient had increased difficulty with sit to stands, machine strengthening and LLE SLR due to strenuous feeling. Patient continues to demonstrate trendelenberg gait deviation with SPC for ambulation. Patient utilized either same or decreased resistance with therapeutic exercises secondary to inactivity with PT due to vacation.   Rehab Potential Excellent   PT Frequency 3x / week   PT Duration 4 weeks   PT Treatment/Interventions ADLs/Self Care Home Management;Electrical Stimulation;Moist Heat;Ultrasound;Gait training;Stair training;Therapeutic activities;Therapeutic exercise;Manual techniques;Patient/family education;Passive range of motion   PT Next Visit Plan Continue with LLE strengthening and balance and gait activities per MPT POC. Specifically strength L hip abductors per MD report by patient.   PT Home Exercise Plan HEP- resisted hip ER with red theraband; standing resisted L hip abduction with yellow theraband   Consulted and Agree with Plan of Care Patient      Patient will benefit from skilled therapeutic intervention in order to improve the following deficits and impairments:  Pain, Decreased activity tolerance, Decreased strength, Abnormal gait  Visit Diagnosis: Other abnormalities of gait and mobility  Muscle weakness (generalized)     Problem List Patient Active Problem List   Diagnosis Date Noted  . Estrogen deficiency 12/15/2015  . MS (multiple sclerosis) ([facility]) 12/15/2015  . Tremor 12/15/2015  . Healthcare maintenance 12/15/2015  . HTN (hypertension)   . Protein-calorie malnutrition, severe 11/09/2015  . Closed left hip fracture ([facility]) 11/08/2015  . Bipolar 1 disorder, depressed ([facility]) 02/11/2015     Angel Maddox, PTA 03/14/2016, 9:48  AM  [facility] [address] [place], [place], [address] Phone: [phone]   Fax:  [phone]  Name: Angel Maddox MRN: [id number] Date of Birth: 01/11/1956    

## 2016-03-16 ENCOUNTER — Ambulatory Visit: Payer: Medicare Other | Admitting: Physical Therapy

## 2016-03-16 ENCOUNTER — Encounter: Payer: Self-pay | Admitting: Physical Therapy

## 2016-03-16 DIAGNOSIS — R2689 Other abnormalities of gait and mobility: Secondary | ICD-10-CM

## 2016-03-16 DIAGNOSIS — M6281 Muscle weakness (generalized): Secondary | ICD-10-CM | POA: Diagnosis not present

## 2016-03-16 NOTE — Therapy (Signed)
[facility] [place], [place], [address] Phone: [phone]   Fax:  [phone]  Physical Therapy Treatment  Patient Details  Name: Angel Maddox MRN: [id number] Date of Birth: April 06, 1956 Referring Provider: Rod Can MD  Encounter Date: 03/16/2016      PT End of Session - 03/16/16 0905    Visit Number 25   Number of Visits 36   Date for PT Re-Evaluation 04/03/16   PT Start Time 0903   PT Stop Time 0949   PT Time Calculation (min) 46 min   Activity Tolerance Patient tolerated treatment well   Behavior During Therapy [facility] for tasks assessed/performed      Past Medical History:  Diagnosis Date  . Anxiety   . Bipolar 1 disorder ([place])   . Chronic kidney disease    kidney stone  . Depression   . H/O hiatal hernia   . Multiple sclerosis ([place])     Had for 15 years  . Tremors of nervous system     Past Surgical History:  Procedure Laterality Date  . HIP PINNING,CANNULATED Left 11/09/2015   Procedure: CANNULATED HIP PINNING;  Surgeon: Rod Can, MD;  Location: [facility];  Service: Orthopedics;  Laterality: Left;  . LIPOMA EXCISION    . TUBAL LIGATION      There were no vitals filed for this visit.      Subjective Assessment - 03/16/16 0905    Subjective Reports that her low back was sore following previous treatment but not her RLE. Requests electrodes and moist heat again as she states her thigh feels tight again and she has been massaging her leg at home.   Limitations Walking   How long can you walk comfortably? I'm okay with my walker.   Patient Stated Goals Walk again without a cane.   Currently in Pain? No/denies            [facility] PT Assessment - 03/16/16 0001      Assessment   Medical Diagnosis Displaced    Onset Date/Surgical Date 11/09/15   Next MD Visit 04/2016     Precautions   Precautions Fall                     [facility] Adult PT Treatment/Exercise - 03/16/16 0001       Knee/Hip Exercises: Aerobic   Stationary Bike L2 x12 min     Knee/Hip Exercises: Machines for Strengthening   Cybex Knee Extension 10# 3x10 reps   Cybex Knee Flexion 30# 3x10 reps     Knee/Hip Exercises: Standing   Hip Abduction Stengthening;Left;3 sets;10 reps;Knee straight   Abduction Limitations 3#   Functional Squat 2 sets;10 reps   Functional Squat Limitations with green theraband donned to knees   Other Standing Knee Exercises Sidestepping green theraband 5 ft x5 RT     Knee/Hip Exercises: Supine   Bridges Limitations 2x10 reps  green theraband with wide BOS     Knee/Hip Exercises: Sidelying   Clams LLE in R sidelying 2x10 reps     Modalities   Modalities Electrical Stimulation;Moist Heat     Moist Heat Therapy   Number Minutes Moist Heat 15 Minutes   Moist Heat Location Hip     Electrical Stimulation   Electrical Stimulation Location L lateral thigh   Electrical Stimulation Action Pre-Mod   Electrical Stimulation Parameters 80-150 hz x15 mn   Electrical Stimulation Goals Pain  PT Short Term Goals - 01/22/16 0904      PT SHORT TERM GOAL #1   Title Independent with an initial HEP.   Time 2   Period Weeks   Status Achieved           PT Long Term Goals - 02/19/16 1109      PT LONG TERM GOAL #1   Title Independent with an advanced HEP.   Time 4   Period Weeks   Status Achieved     PT LONG TERM GOAL #2   Title Increase left hip abduction strength to a solid 4+/5 in order to eliminate her Trendelenburg gait pattern.   Time 4   Period Weeks   Status On-going     PT LONG TERM GOAL #3   Title Walk in clinic with a straight cane 500 feet.   Time 4   Period Weeks   Status Achieved     PT LONG TERM GOAL #4   Title Perform a reciprocating stair gait with one railing.   Time 4   Period Weeks   Status Partially Met               Plan - 03/16/16 0941    Clinical Impression Statement Patient arrived to treatment  with reports of tightness around L hip incision and requested modalities due to the tightness. Patient was educated that she could use her heat pad at home for 10-15 minutes at a time along with continued massage to decrease tightness. Patient had no increased pain with any of the exercises today only increased difficulty as resistance was increased to further increase L hip abductors strength. Normal modaliites response noted following removal of the modalities.    Rehab Potential Excellent   PT Frequency 3x / week   PT Duration 4 weeks   PT Treatment/Interventions ADLs/Self Care Home Management;Electrical Stimulation;Moist Heat;Ultrasound;Gait training;Stair training;Therapeutic activities;Therapeutic exercise;Manual techniques;Patient/family education;Passive range of motion   PT Next Visit Plan Continue with LLE strengthening and balance and gait activities per MPT POC. Specifically strength L hip abductors per MD report by patient.   PT Home Exercise Plan HEP- resisted hip ER with red theraband; standing resisted L hip abduction with yellow theraband   Consulted and Agree with Plan of Care Patient      Patient will benefit from skilled therapeutic intervention in order to improve the following deficits and impairments:  Pain, Decreased activity tolerance, Decreased strength, Abnormal gait  Visit Diagnosis: Other abnormalities of gait and mobility  Muscle weakness (generalized)     Problem List Patient Active Problem List   Diagnosis Date Noted  . Estrogen deficiency 12/15/2015  . MS (multiple sclerosis) ([facility]) 12/15/2015  . Tremor 12/15/2015  . Healthcare maintenance 12/15/2015  . HTN (hypertension)   . Protein-calorie malnutrition, severe 11/09/2015  . Closed left hip fracture ([facility]) 11/08/2015  . Bipolar 1 disorder, depressed ([facility]) 02/11/2015     M Parsons, PTA 03/16/2016, 9:53 AM  [facility] [address] [place],  [place], [address] Phone: [phone]   Fax:  [phone]  Name: Angel Maddox MRN: [id number] Date of Birth: 03/10/1956    

## 2016-03-18 ENCOUNTER — Encounter: Payer: Self-pay | Admitting: Physical Therapy

## 2016-03-18 ENCOUNTER — Ambulatory Visit: Payer: Medicare Other | Admitting: Physical Therapy

## 2016-03-18 DIAGNOSIS — M6281 Muscle weakness (generalized): Secondary | ICD-10-CM | POA: Diagnosis not present

## 2016-03-18 DIAGNOSIS — R2689 Other abnormalities of gait and mobility: Secondary | ICD-10-CM | POA: Diagnosis not present

## 2016-03-18 NOTE — Therapy (Signed)
[facility] [place], [place], [address] Phone: [phone]   Fax:  [phone]  Physical Therapy Treatment  Patient Details  Name: Angel Maddox MRN: [id number] Date of Birth: 05-19-1956 Referring Provider: Rod Can MD  Encounter Date: 03/18/2016      PT End of Session - 03/18/16 0856    Visit Number 26   Number of Visits 36   Date for PT Re-Evaluation 04/03/16   PT Start Time 0902   PT Stop Time 0943   PT Time Calculation (min) 41 min   Activity Tolerance Patient tolerated treatment well   Behavior During Therapy [facility] for tasks assessed/performed      Past Medical History:  Diagnosis Date  . Anxiety   . Bipolar 1 disorder ([place])   . Chronic kidney disease    kidney stone  . Depression   . H/O hiatal hernia   . Multiple sclerosis ([place])     Had for 15 years  . Tremors of nervous system     Past Surgical History:  Procedure Laterality Date  . HIP PINNING,CANNULATED Left 11/09/2015   Procedure: CANNULATED HIP PINNING;  Surgeon: Rod Can, MD;  Location: [facility];  Service: Orthopedics;  Laterality: Left;  . LIPOMA EXCISION    . TUBAL LIGATION      There were no vitals filed for this visit.      Subjective Assessment - 03/18/16 0856    Subjective Reports that LLE is "alright" and denies pain. Reports that her leg doesn't feel as tight today and has some soreness.   Limitations Walking   How long can you walk comfortably? I'm okay with my walker.   Patient Stated Goals Walk again without a cane.   Currently in Pain? No/denies            [facility] PT Assessment - 03/18/16 0001      Assessment   Medical Diagnosis Displaced    Onset Date/Surgical Date 11/09/15   Next MD Visit 04/2016     Precautions   Precautions Fall                     [facility] Adult PT Treatment/Exercise - 03/18/16 0001      Knee/Hip Exercises: Stretches   Active Hamstring Stretch Left;3 reps;30 seconds      Knee/Hip Exercises: Aerobic   Stationary Bike L2 x16 min     Knee/Hip Exercises: Machines for Strengthening   Cybex Knee Extension 10# 3x10 reps   Cybex Knee Flexion 30# 3x10 reps     Knee/Hip Exercises: Standing   Hip Abduction Stengthening;Left;3 sets;10 reps;Knee straight   Abduction Limitations 3#   Functional Squat 2 sets;10 reps   Functional Squat Limitations with green theraband donned to knees   Other Standing Knee Exercises Sidestepping green theraband 5 ft x5 RT   Other Standing Knee Exercises RLE SLS with LLE circles with hip/knee flexion x20 reps     Knee/Hip Exercises: Seated   Clamshell with TheraBand Green  x20 reps     Knee/Hip Exercises: Supine   Bridges Limitations 2x10 reps  with green theraband     Knee/Hip Exercises: Sidelying   Clams LLE in R sidelying 2x10 reps                  PT Short Term Goals - 01/22/16 9163      PT SHORT TERM GOAL #1   Title Independent with an initial HEP.   Time 2  Period Weeks   Status Achieved           PT Long Term Goals - 02/19/16 1109      PT LONG TERM GOAL #1   Title Independent with an advanced HEP.   Time 4   Period Weeks   Status Achieved     PT LONG TERM GOAL #2   Title Increase left hip abduction strength to a solid 4+/5 in order to eliminate her Trendelenburg gait pattern.   Time 4   Period Weeks   Status On-going     PT LONG TERM GOAL #3   Title Walk in clinic with a straight cane 500 feet.   Time 4   Period Weeks   Status Achieved     PT LONG TERM GOAL #4   Title Perform a reciprocating stair gait with one railing.   Time 4   Period Weeks   Status Partially Met               Plan - 03/18/16 0947    Clinical Impression Statement Patient arrived to treatment with soreness but no other complaints. Patient completed all exercises with minimal to moderate multimodal cueing for exercise technique. Patient experienced burning with certain exercises such as LLE hip circles  with green theraband. Patient experienced a superior HS burning with machine knee strengthening today thus HS stretch completed. No modalities conducted today as patient reported decreased tightness around L hip incision.   Rehab Potential Excellent   PT Frequency 3x / week   PT Duration 4 weeks   PT Treatment/Interventions ADLs/Self Care Home Management;Electrical Stimulation;Moist Heat;Ultrasound;Gait training;Stair training;Therapeutic activities;Therapeutic exercise;Manual techniques;Patient/family education;Passive range of motion   PT Next Visit Plan Continue with LLE strengthening and balance and gait activities per MPT POC. Specifically strength L hip abductors per MD report by patient.   PT Home Exercise Plan HEP- resisted hip ER with red theraband; standing resisted L hip abduction with yellow theraband   Consulted and Agree with Plan of Care Patient      Patient will benefit from skilled therapeutic intervention in order to improve the following deficits and impairments:  Pain, Decreased activity tolerance, Decreased strength, Abnormal gait  Visit Diagnosis: Other abnormalities of gait and mobility  Muscle weakness (generalized)     Problem List Patient Active Problem List   Diagnosis Date Noted  . Estrogen deficiency 12/15/2015  . MS (multiple sclerosis) ([place]) 12/15/2015  . Tremor 12/15/2015  . Healthcare maintenance 12/15/2015  . HTN (hypertension)   . Protein-calorie malnutrition, severe 11/09/2015  . Closed left hip fracture ([place]) 11/08/2015  . Bipolar 1 disorder, depressed ([place]) 02/11/2015    [name], PTA 03/18/2016, 9:54 AM  [facility] [address] [place], [place], [address] Phone: [phone]   Fax:  [phone]  Name: Angel Maddox MRN: [id number] Date of Birth: 11/24/1955

## 2016-03-21 ENCOUNTER — Encounter: Payer: Medicare Other | Admitting: *Deleted

## 2016-03-21 DIAGNOSIS — Z1231 Encounter for screening mammogram for malignant neoplasm of breast: Secondary | ICD-10-CM | POA: Diagnosis not present

## 2016-03-23 ENCOUNTER — Ambulatory Visit: Payer: Medicare Other | Admitting: Physical Therapy

## 2016-03-23 DIAGNOSIS — R2689 Other abnormalities of gait and mobility: Secondary | ICD-10-CM

## 2016-03-23 DIAGNOSIS — M6281 Muscle weakness (generalized): Secondary | ICD-10-CM | POA: Diagnosis not present

## 2016-03-23 NOTE — Therapy (Signed)
[facility] [place], [place], [address] Phone: [phone]   Fax:  [phone]  Physical Therapy Treatment  Patient Details  Name: Angel Maddox MRN: [id number] Date of Birth: 05-Feb-1956 Referring Provider: Rod Can MD  Encounter Date: 03/23/2016      PT End of Session - 03/23/16 0903    Visit Number 27   Number of Visits 36   PT Start Time 0901   PT Stop Time 0954   PT Time Calculation (min) 53 min   Activity Tolerance Patient limited by fatigue   Behavior During Therapy [facility] for tasks assessed/performed      Past Medical History:  Diagnosis Date  . Anxiety   . Bipolar 1 disorder ([place])   . Chronic kidney disease    kidney stone  . Depression   . H/O hiatal hernia   . Multiple sclerosis ([place])     Had for 15 years  . Tremors of nervous system     Past Surgical History:  Procedure Laterality Date  . HIP PINNING,CANNULATED Left 11/09/2015   Procedure: CANNULATED HIP PINNING;  Surgeon: Rod Can, MD;  Location: [facility];  Service: Orthopedics;  Laterality: Left;  . LIPOMA EXCISION    . TUBAL LIGATION      There were no vitals filed for this visit.      Subjective Assessment - 03/23/16 0903    Subjective Patient states she mainly has pain with palpation.   Patient Stated Goals Walk again without a cane.   Currently in Pain? Yes   Pain Score 3    Pain Location Hip   Pain Orientation Right;Lateral   Pain Descriptors / Indicators Sore                         [facility] Adult PT Treatment/Exercise - 03/23/16 0001      Knee/Hip Exercises: Stretches   Active Hamstring Stretch Left;2 reps;30 seconds   Active Hamstring Stretch Limitations using strap     Knee/Hip Exercises: Aerobic   Stationary Bike L2 x16 min     Knee/Hip Exercises: Machines for Strengthening   Cybex Knee Extension 10# 2x10 reps   Cybex Knee Flexion 30# 3x10 reps  increased difficulty today     Knee/Hip  Exercises: Standing   Hip Abduction Stengthening;Left;3 sets;10 reps;Knee straight;Both   Abduction Limitations 3# on left only   Functional Squat Limitations held due to increased weakness today   Rocker Board 3 minutes   Other Standing Knee Exercises Sidestepping green theraband 5 ft x8 RT     Modalities   Modalities Electrical Stimulation;Moist Heat     Moist Heat Therapy   Number Minutes Moist Heat 15 Minutes   Moist Heat Location Hip     Electrical Stimulation   Electrical Stimulation Location L lateral thigh   Electrical Stimulation Action premod   Electrical Stimulation Parameters 80-150 Hz to tolerance x 15 min   Electrical Stimulation Goals Pain                  PT Short Term Goals - 01/22/16 0904      PT SHORT TERM GOAL #1   Title Independent with an initial HEP.   Time 2   Period Weeks   Status Achieved           PT Long Term Goals - 02/19/16 1109      PT LONG TERM GOAL #1   Title Independent with an advanced  HEP.   Time 4   Period Weeks   Status Achieved     PT LONG TERM GOAL #2   Title Increase left hip abduction strength to a solid 4+/5 in order to eliminate her Trendelenburg gait pattern.   Time 4   Period Weeks   Status On-going     PT LONG TERM GOAL #3   Title Walk in clinic with a straight cane 500 feet.   Time 4   Period Weeks   Status Achieved     PT LONG TERM GOAL #4   Title Perform a reciprocating stair gait with one railing.   Time 4   Period Weeks   Status Partially Met               Plan - 03/23/16 0940    Clinical Impression Statement Patient did fairly well today, however she was limited by fatigue today due to overdoing it on a walk yesterday. Decreased reps with knee extension and held squats due to fatigue.    PT Treatment/Interventions ADLs/Self Care Home Management;Electrical Stimulation;Moist Heat;Ultrasound;Gait training;Stair training;Therapeutic activities;Therapeutic exercise;Manual  techniques;Patient/family education;Passive range of motion   PT Next Visit Plan Continue with LLE strengthening and balance and gait activities per MPT POC. Specifically strength L hip abductors per MD report by patient.   PT Home Exercise Plan HEP- resisted hip ER with red theraband; standing resisted L hip abduction with yellow theraband      Patient will benefit from skilled therapeutic intervention in order to improve the following deficits and impairments:  Pain, Decreased activity tolerance, Decreased strength, Abnormal gait  Visit Diagnosis: Muscle weakness (generalized)  Other abnormalities of gait and mobility     Problem List Patient Active Problem List   Diagnosis Date Noted  . Estrogen deficiency 12/15/2015  . MS (multiple sclerosis) ([place]) 12/15/2015  . Tremor 12/15/2015  . Healthcare maintenance 12/15/2015  . HTN (hypertension)   . Protein-calorie malnutrition, severe 11/09/2015  . Closed left hip fracture ([place]) 11/08/2015  . Bipolar 1 disorder, depressed ([place]) 02/11/2015    Madelyn Flavors PT 03/23/2016, 9:45 AM  [facility] [address] [place], [place], [address] Phone: [phone]   Fax:  [phone]  Name: Angel Maddox MRN: [id number] Date of Birth: [birth year]

## 2016-03-24 ENCOUNTER — Encounter: Payer: Self-pay | Admitting: Physical Therapy

## 2016-03-24 ENCOUNTER — Ambulatory Visit: Payer: Medicare Other | Admitting: Physical Therapy

## 2016-03-24 DIAGNOSIS — M6281 Muscle weakness (generalized): Secondary | ICD-10-CM

## 2016-03-24 DIAGNOSIS — R2689 Other abnormalities of gait and mobility: Secondary | ICD-10-CM

## 2016-03-24 NOTE — Therapy (Signed)
[facility] [place], [place], [address] Phone: [phone]   Fax:  [phone]  Physical Therapy Treatment  Patient Details  Name: Angel Maddox MRN: [id number] Date of Birth: [birth year] Referring Provider: Rod Can MD  Encounter Date: 03/24/2016      PT End of Session - 03/24/16 0909    Visit Number 28   Number of Visits 36   Date for PT Re-Evaluation 04/03/16   PT Start Time 0903   PT Stop Time 0952   PT Time Calculation (min) 49 min   Activity Tolerance Patient tolerated treatment well;Patient limited by fatigue   Behavior During Therapy [facility] for tasks assessed/performed      Past Medical History:  Diagnosis Date  . Anxiety   . Bipolar 1 disorder ([place])   . Chronic kidney disease    kidney stone  . Depression   . H/O hiatal hernia   . Multiple sclerosis ([place])     Had for 15 years  . Tremors of nervous system     Past Surgical History:  Procedure Laterality Date  . HIP PINNING,CANNULATED Left 11/09/2015   Procedure: CANNULATED HIP PINNING;  Surgeon: Rod Can, MD;  [facility];  Service: Orthopedics;  Laterality: Left;  . LIPOMA EXCISION    . TUBAL LIGATION      There were no vitals filed for this visit.      Subjective Assessment - 03/24/16 0908    Subjective Reports that she is still very sore especially in her calves. Reports that she is just as sore today as she was yesterday.   Limitations Walking   How long can you walk comfortably? I'm okay with my walker.   Patient Stated Goals Walk again without a cane.   Currently in Pain? Yes   Pain Score 3    Pain Location Hip   Pain Orientation Right;Lateral   Pain Descriptors / Indicators Sore            [facility] PT Assessment - 03/24/16 0001      Assessment   Medical Diagnosis Displaced    Onset Date/Surgical Date 11/09/15   Next MD Visit 04/2016     Precautions   Precautions Fall                     [place]  Adult PT Treatment/Exercise - 03/24/16 0001      Knee/Hip Exercises: Stretches   Active Hamstring Stretch Left;3 reps;30 seconds     Knee/Hip Exercises: Aerobic   Stationary Bike L2 x16 min     Knee/Hip Exercises: Machines for Strengthening   Cybex Knee Extension 10# 3x10 reps   Cybex Knee Flexion 20# 3x10 reps     Knee/Hip Exercises: Standing   Rocker Board 3 minutes     Modalities   Modalities Electrical Stimulation;Moist Heat     Moist Heat Therapy   Number Minutes Moist Heat 15 Minutes   Moist Heat Location Hip     Electrical Stimulation   Electrical Stimulation Location L lateral thigh   Electrical Stimulation Action Pre-Mod   Electrical Stimulation Parameters 80-150 hzx15 min   Electrical Stimulation Goals Pain                  PT Short Term Goals - 01/22/16 0904      PT SHORT TERM GOAL #1   Title Independent with an initial HEP.   Time 2   Period Weeks   Status Achieved  PT Long Term Goals - 02/19/16 1109      PT LONG TERM GOAL #1   Title Independent with an advanced HEP.   Time 4   Period Weeks   Status Achieved     PT LONG TERM GOAL #2   Title Increase left hip abduction strength to a solid 4+/5 in order to eliminate her Trendelenburg gait pattern.   Time 4   Period Weeks   Status On-going     PT LONG TERM GOAL #3   Title Walk in clinic with a straight cane 500 feet.   Time 4   Period Weeks   Status Achieved     PT LONG TERM GOAL #4   Title Perform a reciprocating stair gait with one railing.   Time 4   Period Weeks   Status Partially Met               Plan - 03/24/16 0941    Clinical Impression Statement Patient arrived today with continued LE soreness after walking up hill recently. Patient experienced soreness especially into calves per patient report. Greater emphasis today was focused on LE stretching due to the soreness. Resistance lowered for machine knee flexion strengthening due to the soreness and  difficulty with in yesterday's treatment. Patient requested modalities today secondary to the soreness with normal response following removal of the modalities.   Rehab Potential Excellent   PT Frequency 3x / week   PT Duration 4 weeks   PT Treatment/Interventions ADLs/Self Care Home Management;Electrical Stimulation;Moist Heat;Ultrasound;Gait training;Stair training;Therapeutic activities;Therapeutic exercise;Manual techniques;Patient/family education;Passive range of motion   PT Next Visit Plan Continue with LLE strengthening and balance and gait activities per MPT POC. Specifically strength L hip abductors per MD report by patient.   PT Home Exercise Plan HEP- resisted hip ER with red theraband; standing resisted L hip abduction with yellow theraband   Consulted and Agree with Plan of Care Patient      Patient will benefit from skilled therapeutic intervention in order to improve the following deficits and impairments:  Pain, Decreased activity tolerance, Decreased strength, Abnormal gait  Visit Diagnosis: Other abnormalities of gait and mobility  Muscle weakness (generalized)     Problem List Patient Active Problem List   Diagnosis Date Noted  . Estrogen deficiency 12/15/2015  . MS (multiple sclerosis) ([place]) 12/15/2015  . Tremor 12/15/2015  . Healthcare maintenance 12/15/2015  . HTN (hypertension)   . Protein-calorie malnutrition, severe 11/09/2015  . Closed left hip fracture ([place]) 11/08/2015  . Bipolar 1 disorder, depressed ([place]) 02/11/2015    Angel Maddox, PTA 03/24/2016, 10:01 AM  [facility] [address] [place], [place], [address] Phone: [phone]   Fax:  [phone]  Name: Angel Maddox MRN: [id number] Date of Birth: 1955-09-30

## 2016-03-25 ENCOUNTER — Encounter: Payer: Self-pay | Admitting: Physical Therapy

## 2016-03-25 ENCOUNTER — Ambulatory Visit: Payer: Medicare Other | Admitting: Physical Therapy

## 2016-03-25 DIAGNOSIS — M6281 Muscle weakness (generalized): Secondary | ICD-10-CM | POA: Diagnosis not present

## 2016-03-25 DIAGNOSIS — R2689 Other abnormalities of gait and mobility: Secondary | ICD-10-CM

## 2016-03-25 NOTE — Therapy (Signed)
[facility] [place], [place], [address] Phone: [phone]   Fax:  [phone]  Physical Therapy Treatment  Patient Details  Name: Angel Maddox MRN: [id number] Date of Birth: Nov 22, 1955 Referring Provider: Rod Can MD  Encounter Date: 03/25/2016      PT End of Session - 03/25/16 0904    Visit Number 29   Number of Visits 36   Date for PT Re-Evaluation 04/03/16   PT Start Time 0903   PT Stop Time 0952   PT Time Calculation (min) 49 min   Activity Tolerance Patient tolerated treatment well;Patient limited by fatigue   Behavior During Therapy [facility] for tasks assessed/performed      Past Medical History:  Diagnosis Date  . Anxiety   . Bipolar 1 disorder ([place])   . Chronic kidney disease    kidney stone  . Depression   . H/O hiatal hernia   . Multiple sclerosis ([place])     Had for 15 years  . Tremors of nervous system     Past Surgical History:  Procedure Laterality Date  . HIP PINNING,CANNULATED Left 11/09/2015   Procedure: CANNULATED HIP PINNING;  Surgeon: Rod Can, MD;  [facility];  Service: Orthopedics;  Laterality: Left;  . LIPOMA EXCISION    . TUBAL LIGATION      There were no vitals filed for this visit.      Subjective Assessment - 03/25/16 0904    Subjective Reports that calves aren't as sore today but L more than R.   Limitations Walking   How long can you walk comfortably? I'm okay with my walker.   Patient Stated Goals Walk again without a cane.   Currently in Pain? Yes   Pain Score 4    Pain Location Leg   Pain Orientation Right;Left;Lower   Pain Descriptors / Indicators Sore            [facility] PT Assessment - 03/25/16 0001      Assessment   Medical Diagnosis Displaced    Onset Date/Surgical Date 11/09/15   Next MD Visit 04/2016     Precautions   Precautions Fall                     [place] Adult PT Treatment/Exercise - 03/25/16 0001      Knee/Hip  Exercises: Aerobic   Stationary Bike L2 x15 min     Knee/Hip Exercises: Machines for Strengthening   Cybex Knee Extension 10# 3x10 reps   Cybex Knee Flexion 20# 3x10 reps   Cybex Leg Press 1 pl, seat 10, 3x10 reps     Knee/Hip Exercises: Standing   Hip Abduction Stengthening;Left;2 sets;10 reps;Knee straight   Abduction Limitations 3#   Functional Squat 2 sets;10 reps   Functional Squat Limitations with green theraband   Rocker Board 2 minutes     Modalities   Modalities Electrical Stimulation;Moist Heat     Moist Heat Therapy   Number Minutes Moist Heat 15 Minutes   Moist Heat Location Hip;Knee     Electrical Stimulation   Electrical Stimulation Location L lateral hip/ calf   Electrical Stimulation Action Pre-mod   Electrical Stimulation Parameters 80-150 hz x15 min   Electrical Stimulation Goals Pain                  PT Short Term Goals - 01/22/16 0354      PT SHORT TERM GOAL #1   Title Independent with an initial  HEP.   Time 2   Period Weeks   Status Achieved           PT Long Term Goals - 02/19/16 1109      PT LONG TERM GOAL #1   Title Independent with an advanced HEP.   Time 4   Period Weeks   Status Achieved     PT LONG TERM GOAL #2   Title Increase left hip abduction strength to a solid 4+/5 in order to eliminate her Trendelenburg gait pattern.   Time 4   Period Weeks   Status On-going     PT LONG TERM GOAL #3   Title Walk in clinic with a straight cane 500 feet.   Time 4   Period Weeks   Status Achieved     PT LONG TERM GOAL #4   Title Perform a reciprocating stair gait with one railing.   Time 4   Period Weeks   Status Partially Met               Plan - 03/25/16 0940    Clinical Impression Statement Patient arrived to clinic with what she reported as decreased calf soreness overall. Patient continued seated machine knee strengthening and was introduced to light leg press. Patient recieved VCs on leg press to keep knees  in line with toes to prevent hip IR. Other standing strengthening exercises were focused on continued hip abductor strengthening. Normal modalities response noted following removal of the modalities.    Rehab Potential Excellent   PT Frequency 3x / week   PT Duration 4 weeks   PT Treatment/Interventions ADLs/Self Care Home Management;Electrical Stimulation;Moist Heat;Ultrasound;Gait training;Stair training;Therapeutic activities;Therapeutic exercise;Manual techniques;Patient/family education;Passive range of motion   PT Next Visit Plan D/C next treatment and work on gait indoors wihtout AD per patient request.   PT Home Exercise Plan HEP- resisted hip ER with red theraband; standing resisted L hip abduction with yellow theraband   Consulted and Agree with Plan of Care Patient      Patient will benefit from skilled therapeutic intervention in order to improve the following deficits and impairments:  Pain, Decreased activity tolerance, Decreased strength, Abnormal gait  Visit Diagnosis: Other abnormalities of gait and mobility  Muscle weakness (generalized)     Problem List Patient Active Problem List   Diagnosis Date Noted  . Estrogen deficiency 12/15/2015  . MS (multiple sclerosis) ([place]) 12/15/2015  . Tremor 12/15/2015  . Healthcare maintenance 12/15/2015  . HTN (hypertension)   . Protein-calorie malnutrition, severe 11/09/2015  . Closed left hip fracture ([place]) 11/08/2015  . Bipolar 1 disorder, depressed ([place]) 02/11/2015    [name], PTA 03/25/2016, 10:04 AM  [facility] [address] [place], [place], [address] Phone: [phone]   Fax:  [phone]  Name: ANEDRA PENAFIEL MRN: [id number] Date of Birth: December 02, 1955

## 2016-03-28 ENCOUNTER — Ambulatory Visit: Payer: Medicare Other | Attending: Orthopedic Surgery | Admitting: Physical Therapy

## 2016-03-28 ENCOUNTER — Encounter: Payer: Self-pay | Admitting: Physical Therapy

## 2016-03-28 ENCOUNTER — Encounter (HOSPITAL_COMMUNITY): Payer: Self-pay | Admitting: Emergency Medicine

## 2016-03-28 ENCOUNTER — Emergency Department (HOSPITAL_COMMUNITY)
Admission: EM | Admit: 2016-03-28 | Discharge: 2016-03-29 | Disposition: A | Discharge status: Other Acute Inpatient Hospital | Payer: Medicare Other | Attending: Emergency Medicine | Admitting: Emergency Medicine

## 2016-03-28 DIAGNOSIS — Z79899 Other long term (current) drug therapy: Secondary | ICD-10-CM | POA: Diagnosis not present

## 2016-03-28 DIAGNOSIS — Z87891 Personal history of nicotine dependence: Secondary | ICD-10-CM | POA: Diagnosis not present

## 2016-03-28 DIAGNOSIS — R2689 Other abnormalities of gait and mobility: Secondary | ICD-10-CM

## 2016-03-28 DIAGNOSIS — F332 Major depressive disorder, recurrent severe without psychotic features: Secondary | ICD-10-CM | POA: Insufficient documentation

## 2016-03-28 DIAGNOSIS — M6281 Muscle weakness (generalized): Secondary | ICD-10-CM | POA: Diagnosis not present

## 2016-03-28 DIAGNOSIS — F338 Other recurrent depressive disorders: Secondary | ICD-10-CM | POA: Diagnosis not present

## 2016-03-28 DIAGNOSIS — N189 Chronic kidney disease, unspecified: Secondary | ICD-10-CM | POA: Insufficient documentation

## 2016-03-28 DIAGNOSIS — F339 Major depressive disorder, recurrent, unspecified: Secondary | ICD-10-CM

## 2016-03-28 DIAGNOSIS — I129 Hypertensive chronic kidney disease with stage 1 through stage 4 chronic kidney disease, or unspecified chronic kidney disease: Secondary | ICD-10-CM | POA: Insufficient documentation

## 2016-03-28 HISTORY — DX: Essential (primary) hypertension: I10

## 2016-03-28 LAB — BASIC METABOLIC PANEL
ANION GAP: 10 (ref 5–15)
BUN: 13 mg/dL (ref 6–20)
CHLORIDE: 94 mmol/L — AB (ref 101–111)
CO2: 29 mmol/L (ref 22–32)
Calcium: 9.5 mg/dL (ref 8.9–10.3)
Creatinine, Ser: 0.9 mg/dL (ref 0.44–1.00)
GFR calc non Af Amer: >60 mL/min (ref >60)
Glucose, Bld: 91 mg/dL (ref 65–99)
POTASSIUM: 3.1 mmol/L — AB (ref 3.5–5.1)
SODIUM: 133 mmol/L — AB (ref 135–145)

## 2016-03-28 LAB — CBC WITH DIFFERENTIAL/PLATELET
Basophils Absolute: 0 10*3/uL (ref 0.0–0.1)
Basophils Relative: 0 %
Eosinophils Absolute: 0.1 10*3/uL (ref 0.0–0.7)
Eosinophils Relative: 2 %
HEMATOCRIT: 39 % (ref 36.0–46.0)
HEMOGLOBIN: 13.9 g/dL (ref 12.0–15.0)
LYMPHS ABS: 2.2 10*3/uL (ref 0.7–4.0)
LYMPHS PCT: 42 %
MCH: 31.4 pg (ref 26.0–34.0)
MCHC: 35.6 g/dL (ref 30.0–36.0)
MCV: 88.2 fL (ref 78.0–100.0)
MONOS PCT: 10 %
Monocytes Absolute: 0.5 10*3/uL (ref 0.1–1.0)
NEUTROS ABS: 2.4 10*3/uL (ref 1.7–7.7)
NEUTROS PCT: 46 %
Platelets: 235 10*3/uL (ref 150–400)
RBC: 4.42 MIL/uL (ref 3.87–5.11)
RDW: 12.6 % (ref 11.5–15.5)
WBC: 5.2 10*3/uL (ref 4.0–10.5)

## 2016-03-28 LAB — RAPID URINE DRUG SCREEN, HOSP PERFORMED
AMPHETAMINES: NOT DETECTED
BARBITURATES: POSITIVE — AB
Benzodiazepines: NOT DETECTED
Cocaine: NOT DETECTED
OPIATES: NOT DETECTED
TETRAHYDROCANNABINOL: NOT DETECTED

## 2016-03-28 LAB — ETHANOL: Alcohol, Ethyl (B): <5 mg/dL (ref <5)

## 2016-03-28 LAB — PHENOBARBITAL LEVEL: PHENOBARBITAL: 47 ug/mL — AB (ref 15.0–30.0)

## 2016-03-28 MED ORDER — CHLORTHALIDONE 25 MG PO TABS
25.0000 mg | ORAL_TABLET | Freq: Every day | ORAL | Status: DC
Start: 2016-03-29 — End: 2016-03-29

## 2016-03-28 MED ORDER — ZOLPIDEM TARTRATE 5 MG PO TABS: 5.0000 mg | ORAL_TABLET | Freq: Every evening | ORAL | Status: DC | PRN

## 2016-03-28 MED ORDER — ALUM & MAG HYDROXIDE-SIMETH 200-200-20 MG/5ML PO SUSP
30.0000 mL | ORAL | Status: DC | PRN
Start: 2016-03-28 — End: 2016-03-29

## 2016-03-28 MED ORDER — PRIMIDONE 250 MG PO TABS
250.0000 mg | ORAL_TABLET | Freq: Three times a day (TID) | ORAL | Status: DC
  Filled 2016-03-28: qty 1

## 2016-03-28 MED ORDER — CITALOPRAM HYDROBROMIDE 10 MG PO TABS: 40.0000 mg | ORAL_TABLET | Freq: Every day | ORAL | Status: DC

## 2016-03-28 MED ORDER — IBUPROFEN 200 MG PO TABS: 600.0000 mg | ORAL_TABLET | Freq: Three times a day (TID) | ORAL | Status: DC | PRN

## 2016-03-28 MED ORDER — BUPROPION HCL ER (XL) 150 MG PO TB24
300.0000 mg | ORAL_TABLET | Freq: Every day | ORAL | Status: DC
  Filled 2016-03-28: qty 2

## 2016-03-28 MED ORDER — ONDANSETRON HCL 4 MG PO TABS: 4.0000 mg | ORAL_TABLET | Freq: Three times a day (TID) | ORAL | Status: DC | PRN

## 2016-03-28 MED ORDER — ACETAMINOPHEN 325 MG PO TABS: 650.0000 mg | ORAL_TABLET | ORAL | Status: DC | PRN

## 2016-03-28 MED ORDER — NICOTINE 21 MG/24HR TD PT24: 21.0000 mg | MEDICATED_PATCH | Freq: Every day | TRANSDERMAL | Status: DC | PRN

## 2016-03-28 MED ORDER — CLONAZEPAM 0.5 MG PO TABS: 0.5000 mg | ORAL_TABLET | Freq: Three times a day (TID) | ORAL | Status: DC | PRN

## 2016-03-28 MED ORDER — RISPERIDONE 1 MG PO TABS
1.0000 mg | ORAL_TABLET | Freq: Every evening | ORAL | Status: DC
  Filled 2016-03-28: qty 1

## 2016-03-28 MED ORDER — GABAPENTIN 400 MG PO CAPS
400.0000 mg | ORAL_CAPSULE | Freq: Three times a day (TID) | ORAL | Status: DC
  Filled 2016-03-28: qty 1

## 2016-03-28 NOTE — BH Assessment (Addendum)
Tele Assessment Note   Angel Maddox is a 60 y.o. female who presented voluntarily to [facility] due to overwhelming depression. Pt's husband, Angel Maddox, also participated in the assessment. Pt cried throughout the assessment. [name] intimated that pt's son died 1.5 years ago and, ever since then, pt has only cried and slept consistently. [name] added that pt has to be made to eat, or she won't eat. Pt indicated that she is not trying to kill herself by not eating, but just doesn't want to eat. Pt also shared that she feels like she doesn't want to live, but does not want to kill herself.   Diagnosis: MDD, recurrent episode, severe  Past Medical History:  Past Medical History:  Diagnosis Date  . Anxiety   . Bipolar 1 disorder (Angel Maddox)   . Chronic kidney disease    kidney stone  . Depression   . H/O hiatal hernia   . Hypertension   . Multiple sclerosis ([place])     Had for 15 years  . Tremors of nervous system     Past Surgical History:  Procedure Laterality Date  . HIP PINNING,CANNULATED Left 11/09/2015   Procedure: CANNULATED HIP PINNING;  Surgeon: Rod Can, MD;  [facility];  Service: Orthopedics;  Laterality: Left;  . LIPOMA EXCISION    . TUBAL LIGATION      Family History:  Family History  Problem Relation Age of Onset  . Heart attack Mother     Social History:  reports that she quit smoking about 25 years ago. She has never used smokeless tobacco. She reports that she drinks alcohol. She reports that she does not use drugs.  Additional Social History:  Alcohol / Drug Use Pain Medications: see PTA meds Prescriptions: see PTA meds Over the Counter: see PTA meds History of alcohol / drug use?: No history of alcohol / drug abuse  CIWA: CIWA-Ar BP: 144/98 Pulse Rate: 66 COWS:    PATIENT STRENGTHS: (choose at least two) Average or above average intelligence Motivation for treatment/growth Supportive family/friends  Allergies: No Known Allergies  Home  Medications:  (Not in a hospital admission)  OB/GYN Status:  No LMP recorded. Patient is postmenopausal.  General Assessment Data [facility]: [facility] T[facility] Assessment: In system Is this a Tele or Face-to-Face Assessment?: Tele Assessment Is this an Initial Assessment or a Re-assessment for this encounter?: Initial Assessment Marital status: Married Is patient pregnant?: No Pregnancy Status: No Living Arrangements: Spouse/significant other Can pt return to current living arrangement?: Yes Admission Status: Voluntary Is patient capable of signing voluntary admission?: Yes Referral Source: Self/Family/Friend Insurance type: Medicare     Crisis Care Plan Living Arrangements: Spouse/significant other Name of Psychiatrist: [facility]  Education Status Is patient currently in school?: No  Risk to self with the past 6 months Suicidal Ideation: Yes-Currently Present (passive) Has patient been a risk to self within the past 6 months prior to admission? : Yes Suicidal Intent: No Has patient had any suicidal intent within the past 6 months prior to admission? : No Is patient at risk for suicide?: No Suicidal Plan?: No Has patient had any suicidal plan within the past 6 months prior to admission? : No Access to Means: No What has been your use of drugs/alcohol within the last 12 months?: pt denies Previous Attempts/Gestures: No Intentional Self Injurious Behavior: None Family Suicide History: Unknown Recent stressful life event(s): Loss (Comment) (son died 1.5 years ago) Persecutory voices/beliefs?: No Depression: Yes Depression Symptoms: Tearfulness, Loss of  interest in usual pleasures, Feeling worthless/self pity Substance abuse history and/or treatment for substance abuse?: No Suicide prevention information given to non-admitted patients: Not applicable  Risk to Others within the past 6 months Homicidal Ideation: No Does patient have any lifetime risk of violence  toward others beyond the six months prior to admission? : No Thoughts of Harm to Others: No Current Homicidal Intent: No Current Homicidal Plan: No Access to Homicidal Means: No History of harm to others?: No Assessment of Violence: None Noted Does patient have access to weapons?: No Criminal Charges Pending?: No Does patient have a court date: No Is patient on probation?: No  Psychosis Hallucinations: None noted Delusions: None noted  Mental Status Report Appearance/Hygiene: Unremarkable Eye Contact: Fair Motor Activity: Unremarkable Speech: Logical/coherent Level of Consciousness: Crying Mood: Depressed Affect: Appropriate to circumstance Anxiety Level: Minimal Thought Processes: Coherent, Relevant Judgement: Partial Orientation: Person, Time, Place, Situation Obsessive Compulsive Thoughts/Behaviors: None  Cognitive Functioning Concentration: Decreased Memory: Recent Intact, Remote Intact IQ: Average Insight: see judgement above Impulse Control: Good Appetite: Poor Sleep: Increased Vegetative Symptoms: Staying in bed  ADLScreening [facility] Assessment Services) Patient's cognitive ability adequate to safely complete daily activities?: Yes Patient able to express need for assistance with ADLs?: Yes Independently performs ADLs?: Yes (appropriate for developmental age)  Prior Inpatient Therapy Prior Inpatient Therapy: Yes Prior Therapy Dates: 25 years ago Prior Therapy Facilty/Provider(s): [facility] Reason for Treatment: depression  Prior Outpatient Therapy Prior Outpatient Therapy: Yes Prior Therapy Dates: 6-8 months ago Prior Therapy Facilty/Provider(s): unknown Reason for Treatment: depression Does patient have an ACCT team?: No Does patient have Intensive In-House Services?  : No Does patient have [facility] services? : No Does patient have P4CC services?: No  ADL Screening (condition at time of admission) Patient's cognitive ability adequate to safely complete  daily activities?: Yes Is the patient deaf or have difficulty hearing?: No Does the patient have difficulty seeing, even when wearing glasses/contacts?: No Does the patient have difficulty concentrating, remembering, or making decisions?: No Patient able to express need for assistance with ADLs?: Yes Does the patient have difficulty dressing or bathing?: No Independently performs ADLs?: Yes (appropriate for developmental age) Does the patient have difficulty walking or climbing stairs?: Yes Weakness of Legs: None Weakness of Arms/Hands: None  Home Assistive Devices/Equipment Home Assistive Devices/Equipment: None  Therapy Consults (therapy consults require a physician order) PT Evaluation Needed: No OT Evalulation Needed: No SLP Evaluation Needed: No Abuse/Neglect Assessment (Assessment to be complete while patient is alone) Physical Abuse: Denies Verbal Abuse: Denies Sexual Abuse: Denies Exploitation of patient/patient's resources: Denies Self-Neglect: Denies Values / Beliefs Cultural Requests During Hospitalization: None Spiritual Requests During Hospitalization: None Consults Spiritual Care Consult Needed: No Social Work Consult Needed: No Regulatory affairs officer (For Healthcare) Does patient have an advance directive?: No Would patient like information on creating an advanced directive?: No - patient declined information    Additional Information 1:1 In Past 12 Months?: No CIRT Risk: No Elopement Risk: No Does patient have medical clearance?: No     Disposition:  Disposition Initial Assessment Completed for this Encounter: Yes (consulted with Elmarie Shiley, NP) Disposition of Patient: Inpatient treatment program Type of inpatient treatment program: Adult  Rexene Edison 03/28/2016 6:30 PM

## 2016-03-28 NOTE — ED Notes (Signed)
Voluntary admission and consent completed. Paper work for transport complete

## 2016-03-28 NOTE — ED Notes (Signed)
Bed: [facility] Expected date:  Expected time:  Means of arrival:  Comments: 

## 2016-03-28 NOTE — BH Assessment (Signed)
Assessor spoke with Syliva Overman, RN to advise that pt has been accepted to [facility] 300-2 after 23:35.  Lind Covert, MSW, Latanya Presser

## 2016-03-28 NOTE — ED Notes (Signed)
TSS called pt assignment given to behavioral health  300 bed 2 ( ready after 11;45pm )  Accepting doctor dr.[email or web address]  Report called to nurse brookes at 11:35pm  Calling [facility]  at 11:45pm

## 2016-03-28 NOTE — ED Notes (Signed)
pts husband had home meds on him. Gave pt her meds before we could. All meds returned except the gabapentin which was opened and wasted in [facility] with 2nd rn kenny p

## 2016-03-28 NOTE — ED Provider Notes (Signed)
[facility] DEPT Provider Note   CSN: [id number] Arrival date & time: 03/28/16  1423     History   Chief Complaint Chief Complaint  Patient presents with  . Depression    HPI Angel Maddox is a 60 y.o. female.  She presents for evaluation of depression, tearfulness, and anxiousness. She is upset over the death of her son 18 months ago She has been tearful, sad and upset. She denies homicidal or suicidal ideation at this time. She is taking her medicines as prescribed. She has not seen a psychiatrist recently but does see a 38 assistant at the psychiatrist's office. She is not currently getting talk therapy. There are no other known modifying factors.       Marland KitchenHPI  Past Medical History:  Diagnosis Date  . Anxiety   . Bipolar 1 disorder ([place])   . Chronic kidney disease    kidney stone  . Depression   . H/O hiatal hernia   . Hypertension   . Multiple sclerosis ([place])     Had for 15 years  . Tremors of nervous system     Patient Active Problem List   Diagnosis Date Noted  . Estrogen deficiency 12/15/2015  . MS (multiple sclerosis) ([place]) 12/15/2015  . Tremor 12/15/2015  . Healthcare maintenance 12/15/2015  . HTN (hypertension)   . Protein-calorie malnutrition, severe 11/09/2015  . Closed left hip fracture ([place]) 11/08/2015  . Bipolar 1 disorder, depressed ([place]) 02/11/2015    Past Surgical History:  Procedure Laterality Date  . HIP PINNING,CANNULATED Left 11/09/2015   Procedure: CANNULATED HIP PINNING;  Surgeon: Rod Can, MD;  Location: [facility];  Service: Orthopedics;  Laterality: Left;  . LIPOMA EXCISION    . TUBAL LIGATION      OB History    No data available       Home Medications    Prior to Admission medications   Medication Sig Start Date End Date Taking? Authorizing Provider  buPROPion (WELLBUTRIN XL) 300 MG 24 hr tablet Take 1 tablet (300 mg total) by mouth daily. 12/15/15  Yes Timmothy Euler, MD  chlorthalidone (HYGROTON) 25  MG tablet TAKE (1) TABLET BY MOUTH EVERY DAY 03/10/16  Yes Timmothy Euler, MD  citalopram (CELEXA) 40 MG tablet Take 1 tablet (40 mg total) by mouth daily with breakfast. 12/15/15  Yes Timmothy Euler, MD  clonazePAM (KLONOPIN) 0.5 MG tablet Take 0.25-1 mg by mouth 3 (three) times daily as needed for anxiety.    Yes Historical Provider, MD  gabapentin (NEURONTIN) 800 MG tablet Take 400 mg by mouth 3 (three) times daily. 03/21/16  Yes Historical Provider, MD  lactose free nutrition (BOOST) LIQD Take 237 mLs by mouth 2 (two) times daily as needed (poor appetite).   Yes Historical Provider, MD  Multiple Vitamin (MULTIVITAMIN WITH MINERALS) TABS Take 1 tablet by mouth daily.   Yes Historical Provider, MD  primidone (MYSOLINE) 250 MG tablet TAKE ONE (1) TABLET BY MOUTH THREE TIMES DAILY 03/10/16  Yes Timmothy Euler, MD  risperiDONE (RISPERDAL) 1 MG tablet Take 1 tablet (1 mg total) by mouth every evening. 12/15/15  Yes Timmothy Euler, MD  feeding supplement, ENSURE ENLIVE, (ENSURE ENLIVE) LIQD Take 237 mLs by mouth 2 (two) times daily between meals. Patient not taking: Reported on 03/28/2016 11/10/15   Orson Eva, MD  gabapentin (NEURONTIN) 400 MG capsule Take 1 capsule (400 mg total) by mouth 3 (three) times daily. Patient not taking: Reported on 03/28/2016 11/10/15  Orson Eva, MD    Family History Family History  Problem Relation Age of Onset  . Heart attack Mother     Social History Social History  Substance Use Topics  . Smoking status: Former Smoker    Quit date: 01/25/1991  . Smokeless tobacco: Never Used  . Alcohol use Yes     Comment: wine once a month     Allergies   Review of patient's allergies indicates no known allergies.   Review of Systems Review of Systems  All other systems reviewed and are negative.    Physical Exam Updated Vital Signs BP 137/93 (BP Location: Left Arm)   Pulse 66   Temp 97.6 F (36.4 C) (Oral)   Resp 18   SpO2 98%   Physical Exam    Constitutional: She is oriented to person, place, and time. She appears well-developed and well-nourished.  HENT:  Head: Normocephalic and atraumatic.  Eyes: Conjunctivae and EOM are normal. Pupils are equal, round, and reactive to light.  Neck: Normal range of motion and phonation normal. Neck supple.  Cardiovascular: Normal rate and regular rhythm.   Pulmonary/Chest: Effort normal and breath sounds normal. She exhibits no tenderness.  Abdominal: Soft. She exhibits no distension. There is no tenderness. There is no guarding.  Musculoskeletal: Normal range of motion.  Neurological: She is alert and oriented to person, place, and time. She exhibits normal muscle tone.  Skin: Skin is warm and dry.  Psychiatric: Her behavior is normal.  Appears depressed, crying.  Nursing note and vitals reviewed.    ED Treatments / Results  Labs (all labs ordered are listed, but only abnormal results are displayed) Labs Reviewed  BASIC METABOLIC PANEL - Abnormal; Notable for the following:       Result Value   Sodium 133 (*)    Potassium 3.1 (*)    Chloride 94 (*)    All other components within normal limits  URINE RAPID DRUG SCREEN, HOSP PERFORMED - Abnormal; Notable for the following:    Barbiturates POSITIVE (*)    All other components within normal limits  PHENOBARBITAL LEVEL - Abnormal; Notable for the following:    Phenobarbital 47.0 (*)    All other components within normal limits  CBC WITH DIFFERENTIAL/PLATELET  ETHANOL    EKG  EKG Interpretation None       Radiology No results found.  Procedures Procedures (including critical care time)  Medications Ordered in ED Medications  acetaminophen (TYLENOL) tablet 650 mg (not administered)  ibuprofen (ADVIL,MOTRIN) tablet 600 mg (not administered)  zolpidem (AMBIEN) tablet 5 mg (not administered)  nicotine (NICODERM CQ - dosed in mg/24 hours) patch 21 mg (not administered)  ondansetron (ZOFRAN) tablet 4 mg (not administered)   alum & mag hydroxide-simeth (MAALOX/MYLANTA) 200-200-20 MG/5ML suspension 30 mL (not administered)  risperiDONE (RISPERDAL) tablet 1 mg (1 mg Oral Not Given 03/28/16 2144)  gabapentin (NEURONTIN) capsule 400 mg (400 mg Oral Not Given 03/28/16 2151)  clonazePAM (KLONOPIN) tablet 0.5 mg (not administered)  citalopram (CELEXA) tablet 40 mg (not administered)  chlorthalidone (HYGROTON) tablet 25 mg (not administered)  buPROPion (WELLBUTRIN XL) 24 hr tablet 300 mg (300 mg Oral Not Given 03/28/16 2144)  primidone (MYSOLINE) tablet 250 mg (250 mg Oral Not Given 03/28/16 2152)     Initial Impression / Assessment and Plan / ED Course  I have reviewed the triage vital signs and the nursing notes.  Pertinent labs & imaging results that were available during my care of the patient were  reviewed by me and considered in my medical decision making (see chart for details).  Clinical Course  Value Comment By Time  PHENOBARBITAL: (!) 47.0 Above therapeutic range Daleen Bo, MD 10/02 2344  Barbiturates: (!) POSITIVE Prescribed Daleen Bo, MD 10/02 2344   Medically cleared for treatment by Psychiatry Daleen Bo, MD 10/02 2345   Medications  acetaminophen (TYLENOL) tablet 650 mg (not administered)  ibuprofen (ADVIL,MOTRIN) tablet 600 mg (not administered)  zolpidem (AMBIEN) tablet 5 mg (not administered)  nicotine (NICODERM CQ - dosed in mg/24 hours) patch 21 mg (not administered)  ondansetron (ZOFRAN) tablet 4 mg (not administered)  alum & mag hydroxide-simeth (MAALOX/MYLANTA) 200-200-20 MG/5ML suspension 30 mL (not administered)  risperiDONE (RISPERDAL) tablet 1 mg (1 mg Oral Not Given 03/28/16 2144)  gabapentin (NEURONTIN) capsule 400 mg (400 mg Oral Not Given 03/28/16 2151)  clonazePAM (KLONOPIN) tablet 0.5 mg (not administered)  citalopram (CELEXA) tablet 40 mg (not administered)  chlorthalidone (HYGROTON) tablet 25 mg (not administered)  buPROPion (WELLBUTRIN XL) 24 hr tablet 300 mg (300 mg  Oral Not Given 03/28/16 2144)  primidone (MYSOLINE) tablet 250 mg (250 mg Oral Not Given 03/28/16 2152)    Patient Vitals for the past 24 hrs:  BP Temp Temp src Pulse Resp SpO2  03/28/16 2154 137/93 97.6 F (36.4 C) Oral 66 18 98 %  03/28/16 1438 144/98 98.5 F (36.9 C) Oral 66 18 98 %    TTS Consultation- Arranged admission   Final Clinical Impressions(s) / ED Diagnoses   Final diagnoses:  Episode of recurrent major depressive disorder, unspecified depression episode severity ([place])    Depression despite outpatient treatment, requiring inpatient evaluation and treatment.  Nursing Notes Reviewed/ Care Coordinated Applicable Imaging Reviewed Interpretation of Laboratory Data incorporated into ED treatment  Plan: Admit  New Prescriptions New Prescriptions   No medications on file     Daleen Bo, MD 03/28/16 2358

## 2016-03-28 NOTE — Patient Instructions (Addendum)
Balance / Reach   THIS EXERCISE IS A PROGRESSION WHEN READY  Stand on left foot, Holding on to surface for support. Bend knee, lowering body, and reach across. Hold __3__ seconds. Stand back up and rest.  (may use right foot on floor for support Repeat _5-10___ times per set. Do __1-2__ sets per session. Do _1___ sessions per day.   Strengthening: Straight Leg Raise (Phase 2)    Resting on forearms, tighten muscles on front of left thigh, then lift leg __2-4__ inches from surface, keeping knee locked. Repeat __10-30__ times per set. Do __1-3__ sets per session. Do _1-2___ sessions per day.   Strengthening: Hip Abduction - Resisted    With tubing around leftt leg, other side toward anchor, extend leg out from side. Hold on to surface for support. Repeat _10___ times per set. Do _2-3___ sets per session. Do ___1-2_ sessions per day.   Strengthening: Hip Abductor - Resisted    With band looped around both legs above knees, push thigh (left only )apart. Can perform seated or laying down with knees bent. Repeat _20-30___ times per set. Do __1-2__ sets per session. Do __1-2__ sessions per day.   Functional Quadriceps: Sit to Stand    Sit on edge of chair, feet flat on floor. Put left foot back and right foot slightly forward  and make sure your knees are slightly apart for good base of support. Stand upright, extending knees fully. Repeat _10___ times per set. Do _2___ sets per session. Do __2__ sessions per day.

## 2016-03-28 NOTE — Therapy (Signed)
[facility], [place], [address] Phone: [phone]   Fax:  [phone]  Physical Therapy Treatment  Patient Details  Name: Angel Maddox MRN: [id number] Date of Birth: 1956/01/29 Referring Provider: Rod Can MD  Encounter Date: 03/28/2016      PT End of Session - 03/28/16 1002    Visit Number 30   Number of Visits 36   Date for PT Re-Evaluation 04/03/16   PT Start Time 0900   PT Stop Time 1001   PT Time Calculation (min) 61 min   Activity Tolerance Patient tolerated treatment well   Behavior During Therapy [facility] for tasks assessed/performed      Past Medical History:  Diagnosis Date  . Anxiety   . Bipolar 1 disorder ([place])   . Chronic kidney disease    kidney stone  . Depression   . H/O hiatal hernia   . Multiple sclerosis ([place])     Had for 15 years  . Tremors of nervous system     Past Surgical History:  Procedure Laterality Date  . HIP PINNING,CANNULATED Left 11/09/2015   Procedure: CANNULATED HIP PINNING;  Surgeon: Rod Can, MD;  Location: [facility];  Service: Orthopedics;  Laterality: Left;  . LIPOMA EXCISION    . TUBAL LIGATION      There were no vitals filed for this visit.      Subjective Assessment - 03/28/16 0908    Subjective Patient has reported no pain only some ongoing soreness in left hip   Limitations Walking   How long can you walk comfortably? I'm okay with my walker.   Patient Stated Goals Walk again without a cane.   Currently in Pain? No/denies            [facility] PT Assessment - 03/28/16 0001      Strength   Left Hip ABduction 3+/5                     [facility] Adult PT Treatment/Exercise - 03/28/16 0001      Knee/Hip Exercises: Aerobic   Stationary Bike L2 x15 min     Knee/Hip Exercises: Machines for Strengthening   Cybex Knee Extension 10# 3x10 reps   Cybex Knee Flexion 20# 3x10 reps   Cybex Leg Press 1 pl, seat 8, 3x10 reps      Knee/Hip Exercises: Standing   Hip Abduction Stengthening;Left;2 sets;10 reps;Knee straight  with green t-band     Knee/Hip Exercises: Seated   Abduction/Adduction  Strengthening;Left;2 sets;10 reps  green t-band abd   Sit to Sand without UE support  2x10 with left foot back right forward slightly     Moist Heat Therapy   Number Minutes Moist Heat 15 Minutes   Moist Heat Location Hip;Knee     Electrical Stimulation   Electrical Stimulation Location L lateral hip/ calf   Electrical Stimulation Action premod   Electrical Stimulation Parameters 80-150hz   Electrical Stimulation Goals Pain                PT Education - 03/28/16 0959    Education Details HEP with green t-band focused on hip and with correct technique   Person(s) Educated Patient   Methods Explanation;Demonstration;Verbal cues;Handout   Comprehension Verbalized understanding;Returned demonstration          PT Short Term Goals - 01/22/16 0904      PT SHORT TERM GOAL #1   Title Independent with an initial HEP.   Time  2   Period Weeks   Status Achieved           PT Long Term Goals - 03/28/16 1003      PT LONG TERM GOAL #1   Title Independent with an advanced HEP.   Time 4   Period Weeks   Status Achieved     PT LONG TERM GOAL #2   Title Increase left hip abduction strength to a solid 4+/5 in order to eliminate her Trendelenburg gait pattern.   Time 4   Period Weeks   Status Not Met  3+/5 left hip strength 03/28/16     PT LONG TERM GOAL #3   Title Walk in clinic with a straight cane 500 feet.   Time 4   Period Weeks   Status Achieved     PT LONG TERM GOAL #4   Title Perform a reciprocating stair gait with one railing.   Time 4   Period Weeks   Status Not Met  non-reciprocating 03/28/16               Plan - 03/28/16 1006    Clinical Impression Statement Patient has progressed overall and reported 50% improvement overall. Patient has improved hip abd strength on left LE yet  unable to meet goal at this time. Patient able to perform stairs with a non -reciprocating stair gait. HEP given with demo of correct technique and reviewed all machines to transition patient to self assisted gym program. Patient met all LTG's except hip strength and going up and down stairs with reciprocating stair gait due to strength deficits,   Rehab Potential Excellent   PT Frequency 3x / week   PT Duration 4 weeks   PT Treatment/Interventions ADLs/Self Care Home Management;Electrical Stimulation;Moist Heat;Ultrasound;Gait training;Stair training;Therapeutic activities;Therapeutic exercise;Manual techniques;Patient/family education;Passive range of motion   PT Next Visit Plan DC   Consulted and Agree with Plan of Care Patient      Patient will benefit from skilled therapeutic intervention in order to improve the following deficits and impairments:  Pain, Decreased activity tolerance, Decreased strength, Abnormal gait  Visit Diagnosis: Other abnormalities of gait and mobility  Muscle weakness (generalized)     Problem List Patient Active Problem List   Diagnosis Date Noted  . Estrogen deficiency 12/15/2015  . MS (multiple sclerosis) ([place]) 12/15/2015  . Tremor 12/15/2015  . Healthcare maintenance 12/15/2015  . HTN (hypertension)   . Protein-calorie malnutrition, severe 11/09/2015  . Closed left hip fracture ([place]) 11/08/2015  . Bipolar 1 disorder, depressed ([place]) 02/11/2015    ,  P, PTA 03/28/2016, 10:10 AM    [facility] [place], [place], [address] Phone: [phone]   Fax:  [phone]  Name: Angel Maddox MRN: [id number] Date of Birth: August 01, 1955  PHYSICAL THERAPY DISCHARGE SUMMARY  Visits from Start of Care: 30.  Current functional level related to goals / functional outcomes: Please see above.   Remaining deficits: Continued left hip weakness and a non-reciprocating stair gait.    Education / Equipment: HEP. Plan: Patient agrees to discharge.  Patient goals were partially met. Patient is being discharged due to being pleased with the current functional level.  ?????        Angel Maddox MPT

## 2016-03-28 NOTE — ED Triage Notes (Signed)
Patient requesting wants to get help from [facility] for depression.  Patient states that her son died in 09-Aug-2014 and having hard time coping with his death. Patient denies SI/Hi but states that has a lot of feelings of wanting to not live without him.  patient lives with her husband and has MS.  Patient stays in bed for most days per female visitor.

## 2016-03-29 ENCOUNTER — Inpatient Hospital Stay (HOSPITAL_COMMUNITY)
Admission: EM | Admit: 2016-03-29 | Discharge: 2016-04-01 | DRG: 885 | Disposition: A | Discharge status: Home / Self Care | Payer: Medicare Other | Source: Intra-hospital | Attending: Psychiatry | Admitting: Psychiatry

## 2016-03-29 ENCOUNTER — Encounter (HOSPITAL_COMMUNITY): Payer: Self-pay | Admitting: Nurse Practitioner

## 2016-03-29 DIAGNOSIS — F332 Major depressive disorder, recurrent severe without psychotic features: Principal | ICD-10-CM | POA: Diagnosis present

## 2016-03-29 DIAGNOSIS — Z79899 Other long term (current) drug therapy: Secondary | ICD-10-CM

## 2016-03-29 DIAGNOSIS — Z8249 Family history of ischemic heart disease and other diseases of the circulatory system: Secondary | ICD-10-CM

## 2016-03-29 DIAGNOSIS — F339 Major depressive disorder, recurrent, unspecified: Secondary | ICD-10-CM | POA: Diagnosis not present

## 2016-03-29 DIAGNOSIS — G35 Multiple sclerosis: Secondary | ICD-10-CM | POA: Diagnosis present

## 2016-03-29 DIAGNOSIS — R45851 Suicidal ideations: Secondary | ICD-10-CM | POA: Diagnosis not present

## 2016-03-29 DIAGNOSIS — Z87891 Personal history of nicotine dependence: Secondary | ICD-10-CM | POA: Diagnosis not present

## 2016-03-29 DIAGNOSIS — G629 Polyneuropathy, unspecified: Secondary | ICD-10-CM | POA: Diagnosis present

## 2016-03-29 LAB — BASIC METABOLIC PANEL
Anion gap: 9 (ref 5–15)
BUN: 11 mg/dL (ref 6–20)
CALCIUM: 9.6 mg/dL (ref 8.9–10.3)
CO2: 32 mmol/L (ref 22–32)
Chloride: 94 mmol/L — ABNORMAL LOW (ref 101–111)
Creatinine, Ser: 0.77 mg/dL (ref 0.44–1.00)
GFR calc Af Amer: >60 mL/min (ref >60)
GFR calc non Af Amer: >60 mL/min (ref >60)
GLUCOSE: 86 mg/dL (ref 65–99)
Potassium: 3 mmol/L — ABNORMAL LOW (ref 3.5–5.1)
SODIUM: 135 mmol/L (ref 135–145)

## 2016-03-29 LAB — HEPATIC FUNCTION PANEL
ALT: 28 U/L (ref 14–54)
AST: 23 U/L (ref 15–41)
Albumin: 4.8 g/dL (ref 3.5–5.0)
Alkaline Phosphatase: 101 U/L (ref 38–126)
TOTAL PROTEIN: 7.8 g/dL (ref 6.5–8.1)
Total Bilirubin: 0.6 mg/dL (ref 0.3–1.2)

## 2016-03-29 LAB — MAGNESIUM: MAGNESIUM: 1.9 mg/dL (ref 1.7–2.4)

## 2016-03-29 LAB — PHENOBARBITAL LEVEL: PHENOBARBITAL: 44.6 ug/mL — AB (ref 15.0–30.0)

## 2016-03-29 MED ORDER — RISPERIDONE 1 MG PO TABS
1.0000 mg | ORAL_TABLET | Freq: Every evening | ORAL | Status: DC
  Filled 2016-03-29: qty 1

## 2016-03-29 MED ORDER — TRAZODONE HCL 50 MG PO TABS
50.0000 mg | ORAL_TABLET | Freq: Every evening | ORAL | Status: DC | PRN
  Administered 2016-03-29 – 2016-03-31 (×3): 50 mg via ORAL
  Filled 2016-03-29 (×8): qty 1

## 2016-03-29 MED ORDER — CITALOPRAM HYDROBROMIDE 40 MG PO TABS
40.0000 mg | ORAL_TABLET | Freq: Every day | ORAL | Status: DC
  Administered 2016-03-29: 40 mg via ORAL
  Filled 2016-03-29 (×2): qty 1

## 2016-03-29 MED ORDER — BOOST PO LIQD
237.0000 mL | Freq: Two times a day (BID) | ORAL | Status: DC | PRN
  Filled 2016-03-29: qty 237

## 2016-03-29 MED ORDER — CLONAZEPAM 0.5 MG PO TABS
0.2500 mg | ORAL_TABLET | Freq: Three times a day (TID) | ORAL | Status: DC | PRN
Start: 2016-03-29 — End: 2016-04-01

## 2016-03-29 MED ORDER — HYDROXYZINE HCL 25 MG PO TABS
25.0000 mg | ORAL_TABLET | Freq: Four times a day (QID) | ORAL | Status: DC | PRN
  Administered 2016-04-01: 25 mg via ORAL
  Filled 2016-03-29: qty 1

## 2016-03-29 MED ORDER — HYDROXYZINE HCL 50 MG PO TABS
50.0000 mg | ORAL_TABLET | Freq: Once | ORAL | Status: AC
  Administered 2016-03-29: 50 mg via ORAL
  Filled 2016-03-29 (×2): qty 1

## 2016-03-29 MED ORDER — ALUM & MAG HYDROXIDE-SIMETH 200-200-20 MG/5ML PO SUSP: 30.0000 mL | ORAL | Status: DC | PRN

## 2016-03-29 MED ORDER — CLONAZEPAM 0.5 MG PO TABS: 0.2500 mg | ORAL_TABLET | Freq: Three times a day (TID) | ORAL | Status: DC | PRN

## 2016-03-29 MED ORDER — PRIMIDONE 50 MG PO TABS
100.0000 mg | ORAL_TABLET | Freq: Three times a day (TID) | ORAL | Status: DC
  Administered 2016-03-29 – 2016-03-30 (×3): 100 mg via ORAL
  Filled 2016-03-29 (×6): qty 2

## 2016-03-29 MED ORDER — ACETAMINOPHEN 325 MG PO TABS
650.0000 mg | ORAL_TABLET | Freq: Four times a day (QID) | ORAL | Status: DC | PRN
  Administered 2016-03-30: 650 mg via ORAL
  Filled 2016-03-29: qty 2

## 2016-03-29 MED ORDER — GABAPENTIN 400 MG PO CAPS
400.0000 mg | ORAL_CAPSULE | Freq: Three times a day (TID) | ORAL | Status: DC
  Administered 2016-03-29 – 2016-04-01 (×11): 400 mg via ORAL
  Filled 2016-03-29 (×14): qty 1

## 2016-03-29 MED ORDER — ADULT MULTIVITAMIN W/MINERALS CH
1.0000 | ORAL_TABLET | Freq: Every day | ORAL | Status: DC
  Administered 2016-03-29 – 2016-04-01 (×4): 1 via ORAL
  Filled 2016-03-29 (×5): qty 1

## 2016-03-29 MED ORDER — CHLORTHALIDONE 25 MG PO TABS
25.0000 mg | ORAL_TABLET | Freq: Every day | ORAL | Status: DC
  Administered 2016-03-29 – 2016-04-01 (×4): 25 mg via ORAL
  Filled 2016-03-29 (×5): qty 1

## 2016-03-29 MED ORDER — BUPROPION HCL ER (XL) 300 MG PO TB24
300.0000 mg | ORAL_TABLET | Freq: Every day | ORAL | Status: DC
  Administered 2016-03-29 – 2016-04-01 (×4): 300 mg via ORAL
  Filled 2016-03-29 (×5): qty 1

## 2016-03-29 MED ORDER — MAGNESIUM HYDROXIDE 400 MG/5ML PO SUSP: 30.0000 mL | Freq: Every day | ORAL | Status: DC | PRN

## 2016-03-29 MED ORDER — CLONAZEPAM 0.5 MG PO TABS: 0.5000 mg | ORAL_TABLET | Freq: Three times a day (TID) | ORAL | Status: DC | PRN
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
## 2016-03-29 NOTE — Progress Notes (Signed)
Patient ID: EMORY PRASHAD, female   DOB: May 11, 1956, 60 y.o.   MRN: [id number] PER STATE REGULATIONS 482.30  THIS CHART WAS REVIEWED FOR MEDICAL NECESSITY WITH RESPECT TO THE PATIENT'S ADMISSION/DURATION OF STAY.  NEXT REVIEW DATE:04/02/16 Roma Schanz, RN, BSN CASE MANAGER

## 2016-03-29 NOTE — Progress Notes (Signed)
D:  Patient depressed and anxious; she is frequently tearful.  She says "i just want to get help"  She denies suicidal and homicidal ideation and AVH; no self-injurious behaviors noted or reported A:  Medications given as scheduled.  Emotional support provided; encouraged her to seek assistance with needs/concerns R:  Remains on 15 minute safety checks; safety maintained on unit.

## 2016-03-29 NOTE — Tx Team (Signed)
Initial Treatment Plan 03/29/2016 3:24 AM [name] [id number]    PATIENT STRESSORS: Loss of son who died from an overdose 02-08-2016Marital or family conflict Traumatic event   PATIENT STRENGTHS: Capable of independent living Communication skills General fund of knowledge Motivation for treatment/growth Supportive family/friends   PATIENT IDENTIFIED PROBLEMS: "Help me cope with my son's death"  "Help me cope with being with my grandchildren who are my son's children."  Depression   Suicidal ideation               DISCHARGE CRITERIA:  Ability to meet basic life and health needs Improved stabilization in mood, thinking, and/or behavior Safe-care adequate arrangements made Verbal commitment to aftercare and medication compliance  PRELIMINARY DISCHARGE PLAN: Participate in family therapy Return to previous living arrangement Referrals indicated:  In Home Family grief counseling  PATIENT/FAMILY INVOLVEMENT: This treatment plan has been presented to and reviewed with the patient, Angel Maddox  The patient and family have been given the opportunity to ask questions and make suggestions.  Gildardo Pounds, RN 03/29/2016, 3:24 AM

## 2016-03-29 NOTE — Progress Notes (Signed)
Recreation Therapy Notes  Animal-Assisted Activity (AAA) Program Checklist/Progress Notes Patient Eligibility Criteria Checklist & Daily Group note for Rec TxIntervention  Date: 10.03.2017 Time: 2:45pm Location: [address]    AAA/T Program Assumption of Risk Form signed by Patient/ or Parent Legal Guardian Yes  Patient is free of allergies or sever asthma Yes  Patient reports no fear of animals Yes  Patient reports no history of cruelty to animals Yes  Patient understands his/her participation is voluntary Yes  Patient washes hands before animal contact Yes  Patient washes hands after animal contact Yes  Behavioral Response: Engaged, Attentive   Education:Hand Washing, Appropriate Animal Interaction   Education Outcome: Acknowledges education.   Clinical Observations/Feedback: Patient attended session and interacted appropriately with therapy dog and peers. Laureen Ochs Heily Carlucci, LRT/CTRS  Lenea Bywater L 03/29/2016 3:02 PM

## 2016-03-29 NOTE — Progress Notes (Signed)
NUTRITION ASSESSMENT  Pt identified as at risk on the Malnutrition Screen Tool  INTERVENTION: 1. Supplements: Continue Boost supplements PRN, each provides 240 kcal and 10g protein.  NUTRITION DIAGNOSIS: Unintentional weight loss related to sub-optimal intake as evidenced by pt report.   Goal: Pt to meet >/= 90% of their estimated nutrition needs.  Monitor:  PO intake  Assessment:  Pt admitted with depression. PMHx of MS and diagnosed with severe malnutrition in May 2017. Pt reports decreased appetite. Pt has lost 13 lb since March 2017 (9% wt loss x 7 months, insignificant for time frame). Patient has been ordered Boost supplements, will continue order.   Height: Ht Readings from Last 1 Encounters:  03/29/16 5\' 8"  (1.727 m)    Weight: Wt Readings from Last 1 Encounters:  03/29/16 134 lb (60.8 kg)    Weight Hx: Wt Readings from Last 10 Encounters:  03/29/16 134 lb (60.8 kg)  01/25/16 140 lb (63.5 kg)  01/13/16 140 lb (63.5 kg)  11/08/15 140 lb (63.5 kg)  08/28/15 147 lb 3.2 oz (66.8 kg)  02/11/15 144 lb (65.3 kg)  04/11/14 138 lb 6.4 oz (62.8 kg)  05/21/12 133 lb (60.3 kg)    BMI:  Body mass index is 20.37 kg/m. Pt meets criteria for normal range based on current BMI.  Estimated Nutritional Needs: Kcal: 25-30 kcal/kg Protein: > 1 gram protein/kg Fluid: 1 ml/kcal  Diet Order: Diet regular Room service appropriate? Yes; Fluid consistency: Thin Pt is also offered choice of unit snacks mid-morning and mid-afternoon.  Pt is eating as desired.   Lab results and medications reviewed.   Clayton Bibles, MS, RD, LDN Pager: [phone] After Hours Pager: [phone]

## 2016-03-29 NOTE — Plan of Care (Signed)
Problem: Medication: Goal: Compliance with prescribed medication regimen will improve Outcome: Progressing Compliant with medication regimen  Problem: Self-Concept: Goal: Level of anxiety will decrease Outcome: Not Progressing Patient remains anxious
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
## 2016-03-29 NOTE — BHH Suicide Risk Assessment (Signed)
[facility] Admission Suicide Risk Assessment   Nursing information obtained from:  Patient Demographic factors:  Caucasian, Unemployed Current Mental Status:  Suicidal ideation indicated by patient Loss Factors:  Loss of significant relationship Historical Factors:  Family history of suicide, Family history of mental illness or substance abuse Risk Reduction Factors:  Sense of responsibility to family, Living with another person, especially a relative, Positive social support  Total Time spent with patient: 45 minutes Principal Problem: <principal problem not specified> Diagnosis:   Patient Active Problem List   Diagnosis Date Noted  . MDD (major depressive disorder), recurrent episode, severe ([place]) [F33.2] 03/29/2016  . Estrogen deficiency [E28.39] 12/15/2015  . MS (multiple sclerosis) ([place]) [G35] 12/15/2015  . Tremor [R25.1] 12/15/2015  . Healthcare maintenance [Z00.00] 12/15/2015  . HTN (hypertension) [I10]   . Protein-calorie malnutrition, severe [E43] 11/09/2015  . Closed left hip fracture ([place]) [S72.002A] 11/08/2015  . Bipolar 1 disorder, depressed ([place]) [F31.9] 02/11/2015   Subjective Data: ongoing depression and passive si in context of loss of son in 2016  Continued Clinical Symptoms:  Alcohol Use Disorder Identification Test Final Score (AUDIT): 1 The "Alcohol Use Disorders Identification Test", Guidelines for Use in Primary Care, Second Edition.  World Pharmacologist [facility]). Score between 0-7:  no or low risk or alcohol related problems. Score between 8-15:  moderate risk of alcohol related problems. Score between 16-19:  high risk of alcohol related problems. Score 20 or above:  warrants further diagnostic evaluation for alcohol dependence and treatment.   CLINICAL FACTORS:   Depression:   Anhedonia Hopelessness Severe Medical Diagnoses and Treatments/Surgeries   Musculoskeletal: Strength & Muscle Tone: not tested Gait & Station: unsteady Patient leans:  N/A  Psychiatric Specialty Exam: Physical Exam gait unsteady, leg jiggling/tremor  ROS gait tremor as above  Blood pressure 134/77, pulse 90, temperature 97.6 F (36.4 C), temperature source Oral, resp. rate 18, height 5\' 8"  (1.727 m), weight 60.8 kg (134 lb).Body mass index is 20.37 kg/m.  General Appearance: Fairly Groomed  Eye Contact:  Fair  Speech:  Clear and Coherent  Volume:  Normal  Mood:  Depressed  Affect:  Congruent and Labile  Thought Process:  Coherent  Orientation:  Full (Time, Place, and Person)  Thought Content:  Negative  Suicidal Thoughts:  Yes.  without intent/plan  Homicidal Thoughts:  No  Memory:  Negative  Judgement:  Fair  Insight:  Fair  Psychomotor Activity:  Tremor  Concentration:  Concentration: Fair and Attention Span: Fair  Recall:  Good  Fund of Knowledge:  Good  Language:  Good  Akathisia:  No  Handed:  Right  AIMS (if indicated):     Assets:  Resilience  ADL's:  Intact  Cognition:  WNL  Sleep:  Number of Hours: 5.5      COGNITIVE FEATURES THAT CONTRIBUTE TO RISK:  None    SUICIDE RISK:   Mild:  Suicidal ideation of limited frequency, intensity, duration, and specificity.  There are no identifiable plans, no associated intent, mild dysphoria and related symptoms, good self-control (both objective and subjective assessment), few other risk factors, and identifiable protective factors, including available and accessible social support.   PLAN OF CARE: see PAA  I certify that inpatient services furnished can reasonably be expected to improve the patient's condition.  Linard Millers, MD 03/29/2016, 12:26 PM

## 2016-03-30 LAB — BASIC METABOLIC PANEL
ANION GAP: 9 (ref 5–15)
BUN: 13 mg/dL (ref 6–20)
CO2: 32 mmol/L (ref 22–32)
Calcium: 9.7 mg/dL (ref 8.9–10.3)
Chloride: 94 mmol/L — ABNORMAL LOW (ref 101–111)
Creatinine, Ser: 0.81 mg/dL (ref 0.44–1.00)
GFR calc Af Amer: >60 mL/min (ref >60)
GFR calc non Af Amer: >60 mL/min (ref >60)
GLUCOSE: 100 mg/dL — AB (ref 65–99)
POTASSIUM: 3.4 mmol/L — AB (ref 3.5–5.1)
Sodium: 135 mmol/L (ref 135–145)

## 2016-03-30 MED ORDER — PRIMIDONE 50 MG PO TABS
200.0000 mg | ORAL_TABLET | Freq: Three times a day (TID) | ORAL | Status: DC
  Administered 2016-03-30 – 2016-03-31 (×3): 200 mg via ORAL
  Filled 2016-03-30 (×6): qty 4

## 2016-03-30 NOTE — BHH Group Notes (Signed)
[place] LCSW Group Therapy 03/30/2016 1:15 PM  Type of Therapy: Group Therapy- Emotion Regulation  Participation Level: Reserved   Participation Quality:  Attentive  Affect: Flat  Cognitive: Alert and Oriented   Insight:  Unable to assess  Engagement in Therapy: Developing/Improving    Modes of Intervention: Clarification, Confrontation, Discussion, Education, Exploration, Limit-setting, Orientation, Problem-solving, Rapport Building, Art therapist, Socialization and Support  Summary of Progress/Problems: The topic for group today was emotional regulation. This group focused on both positive and negative emotion identification and allowed group members to process ways to identify feelings, regulate negative emotions, and find healthy ways to manage internal/external emotions. Group members were asked to reflect on a time when their reaction to an emotion led to a negative outcome and explored how alternative responses using emotion regulation would have benefited them. Group members were also asked to discuss a time when emotion regulation was utilized when a negative emotion was experienced. Pt did not participate in group discussion but tracked the conversation and was attentive to peers.   Angel Maddox, [place] 03/30/2016 3:15 PM

## 2016-03-30 NOTE — BHH Suicide Risk Assessment (Signed)
[place] INPATIENT:  Family/Significant Other Suicide Prevention Education  Suicide Prevention Education:  Education Completed; Angel Maddox, husband, [phone]  has been identified by the patient as the family member/significant other with whom the patient will be residing, and identified as the person(s) who will aid the patient in the event of a mental health crisis (suicidal ideations/suicide attempt).  With written consent from the patient, the family member/significant other has been provided the following suicide prevention education, prior to the and/or following the discharge of the patient.  The suicide prevention education provided includes the following:  Suicide risk factors  Suicide prevention and interventions  National Suicide Hotline telephone number  [facility] assessment telephone number  [facility] Emergency Assistance [place] and/or Residential Mobile Crisis Unit telephone number  Request made of family/significant other to:  Remove weapons (e.g., guns, rifles, knives), all items previously/currently identified as safety concern.    Remove drugs/medications (over-the-counter, prescriptions, illicit drugs), all items previously/currently identified as a safety concern.  The family member/significant other verbalizes understanding of the suicide prevention education information provided.  The family member/significant other agrees to remove the items of safety concern listed above.  Angel Maddox 03/30/2016, 1:43 PM

## 2016-03-30 NOTE — BHH Group Notes (Signed)
[place] LCSW Group Therapy  03/30/2016 3:28 PM  Type of Therapy:  Group Therapy  Participation Level:  Did Not Attend

## 2016-03-30 NOTE — BHH Group Notes (Signed)
Patient attend group NA
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
## 2016-03-30 NOTE — Tx Team (Signed)
Interdisciplinary Treatment and Diagnostic Plan Update  03/30/2016 Time of Session: 1:20 PM  NALLA PURDY MRN: [id number]  Principal Diagnosis: MDD (major depressive disorder), recurrent episode, severe ([place])  Secondary Diagnoses: Active Problems:   MDD (major depressive disorder), recurrent episode, severe (H[facility])   Current Medications:  Current Facility-Administered Medications  Medication Dose Route Frequency Provider Last Rate Last Dose  . acetaminophen (TYLENOL) tablet 650 mg  650 mg Oral Q6H PRN Laverle Hobby, PA-C   650 mg at 03/30/16 1152  . alum & mag hydroxide-simeth (MAALOX/MYLANTA) 200-200-20 MG/5ML suspension 30 mL  30 mL Oral Q4H PRN Laverle Hobby, PA-C      . buPROPion (WELLBUTRIN XL) 24 hr tablet 300 mg  300 mg Oral Daily Laverle Hobby, PA-C   300 mg at 03/30/16 0744  . chlorthalidone (HYGROTON) tablet 25 mg  25 mg Oral Daily Laverle Hobby, PA-C   25 mg at 03/30/16 0744  . clonazePAM (KLONOPIN) tablet 0.25 mg  0.25 mg Oral TID PRN Linard Millers, MD      . gabapentin (NEURONTIN) capsule 400 mg  400 mg Oral TID Laverle Hobby, PA-C   400 mg at 03/30/16 1206  . hydrOXYzine (ATARAX/VISTARIL) tablet 25 mg  25 mg Oral Q6H PRN Laverle Hobby, PA-C      . lactose free nutrition (Boost) liquid 237 mL  237 mL Oral BID PRN Laverle Hobby, PA-C      . magnesium hydroxide (MILK OF MAGNESIA) suspension 30 mL  30 mL Oral Daily PRN Laverle Hobby, PA-C      . multivitamin with minerals tablet 1 tablet  1 tablet Oral Daily Laverle Hobby, PA-C   1 tablet at 03/30/16 0744  . primidone (MYSOLINE) tablet 200 mg  200 mg Oral Q8H Linard Millers, MD   200 mg at 03/30/16 1303  . traZODone (DESYREL) tablet 50 mg  50 mg Oral QHS,MR X 1 Laverle Hobby, PA-C   50 mg at 03/29/16 2128    PTA Medications: Prescriptions Prior to Admission  Medication Sig Dispense Refill Last Dose  . buPROPion (WELLBUTRIN XL) 300 MG 24 hr tablet Take 1 tablet (300 mg total) by mouth  daily. 30 tablet 2 03/28/2016 at Unknown time  . chlorthalidone (HYGROTON) 25 MG tablet TAKE (1) TABLET BY MOUTH EVERY DAY 30 tablet 3 03/28/2016 at Unknown time  . citalopram (CELEXA) 40 MG tablet Take 1 tablet (40 mg total) by mouth daily with breakfast. 30 tablet 3 03/28/2016 at Unknown time  . clonazePAM (KLONOPIN) 0.5 MG tablet Take 0.25-1 mg by mouth 3 (three) times daily as needed for anxiety.    03/28/2016 at Unknown time  . feeding supplement, ENSURE ENLIVE, (ENSURE ENLIVE) LIQD Take 237 mLs by mouth 2 (two) times daily between meals. (Patient not taking: Reported on 03/28/2016) 60 Bottle 0 Not Taking at Unknown time  . gabapentin (NEURONTIN) 400 MG capsule Take 1 capsule (400 mg total) by mouth 3 (three) times daily. (Patient not taking: Reported on 03/28/2016) 90 capsule 0 Not Taking at Unknown time  . gabapentin (NEURONTIN) 800 MG tablet Take 400 mg by mouth 3 (three) times daily.   03/28/2016 at Unknown time  . lactose free nutrition (BOOST) LIQD Take 237 mLs by mouth 2 (two) times daily as needed (poor appetite).   Past Month at Unknown time  . Multiple Vitamin (MULTIVITAMIN WITH MINERALS) TABS Take 1 tablet by mouth daily.   Past Month at Unknown time  .  primidone (MYSOLINE) 250 MG tablet TAKE ONE (1) TABLET BY MOUTH THREE TIMES DAILY 90 tablet 3 03/28/2016 at Unknown time  . risperiDONE (RISPERDAL) 1 MG tablet Take 1 tablet (1 mg total) by mouth every evening. 30 tablet 2 03/27/2016 at Unknown time    Treatment Modalities: Medication Management, Group therapy, Case management,  1 to 1 session with clinician, Psychoeducation, Recreational therapy.   Physician Treatment Plan for Primary Diagnosis: MDD (major depressive disorder), recurrent episode, severe ([place]) Long Term Goal(s): Improvement in symptoms so as ready for discharge  Short Term Goals: Ability to disclose and discuss suicidal ideas  Medication Management: Evaluate patient's response, side effects, and tolerance of medication  regimen.  Therapeutic Interventions: 1 to 1 sessions, Unit Group sessions and Medication administration.  Evaluation of Outcomes: Adequate for Discharge  Physician Treatment Plan for Secondary Diagnosis: Active Problems:   MDD (major depressive disorder), recurrent episode, severe ([place])   Long Term Goal(s): Improvement in symptoms so as ready for discharge  Short Term Goals: Ability to identify and develop effective coping behaviors will improve  Medication Management: Evaluate patient's response, side effects, and tolerance of medication regimen.  Therapeutic Interventions: 1 to 1 sessions, Unit Group sessions and Medication administration.  Evaluation of Outcomes: Adequate for Discharge   RN Treatment Plan for Primary Diagnosis: MDD (major depressive disorder), recurrent episode, severe ([place]) Long Term Goal(s): Knowledge of disease and therapeutic regimen to maintain health will improve  Short Term Goals: Ability to remain free from injury will improve and Ability to verbalize feelings will improve  Medication Management: RN will administer medications as ordered by provider, will assess and evaluate patient's response and provide education to patient for prescribed medication. RN will report any adverse and/or side effects to prescribing provider.  Therapeutic Interventions: 1 on 1 counseling sessions, Psychoeducation, Medication administration, Evaluate responses to treatment, Monitor vital signs and CBGs as ordered, Perform/monitor CIWA, COWS, AIMS and Fall Risk screenings as ordered, Perform wound care treatments as ordered.  Evaluation of Outcomes: Adequate for Discharge   LCSW Treatment Plan for Primary Diagnosis: MDD (major depressive disorder), recurrent episode, severe ([place]) Long Term Goal(s): Safe transition to appropriate next level of care at discharge, Engage patient in therapeutic group addressing interpersonal concerns.  Short Term Goals: Engage patient in  aftercare planning with referrals and resources  Therapeutic Interventions: Assess for all discharge needs, 1 to 1 time with Social worker, Explore available resources and support systems, Assess for adequacy in community support network, Educate family and significant other(s) on suicide prevention, Complete Psychosocial Assessment, Interpersonal group therapy.  Evaluation of Outcomes: Met   Progress in Treatment: Attending groups: Yes Participating in groups: Yes Taking medication as prescribed: Yes Toleration medication: Yes, no side effects reported at this time Family/Significant other contact made: Yes Patient understands diagnosis: Yes AEB asking for help with depression Discussing patient identified problems/goals with staff: Yes Medical problems stabilized or resolved: Yes Denies suicidal/homicidal ideation: Yes Issues/concerns per patient self-inventory: None Other: N/A  New problem(s) identified: None identified at this time.   New Short Term/Long Term Goal(s): None identified at this time.   Discharge Plan or Barriers: return home, follow up outpt  Reason for Continuation of Hospitalization: Anxiety Depression Medication stabilization Suicidal ideation   Estimated Length of Stay: Likely d/c tomorrow, Friday at the latest  Attendees: Patient: 03/30/2016  1:20 PM  Physician: Odelia Gage, MD 03/30/2016  1:20 PM  Nursing: Hoy Register, RN 03/30/2016  1:20 PM  RN Care Manager: Lars Pinks, RN  03/30/2016  1:20 PM  Social Worker: Ripley Fraise 03/30/2016  1:20 PM  Recreational Therapist: Marjette  03/30/2016  1:20 PM  Other: Norberto Sorenson 03/30/2016  1:20 PM  Other:  03/30/2016  1:20 PM    Scribe for Treatment Team:  Roque Lias 03/30/2016 1:20 PM

## 2016-03-30 NOTE — Progress Notes (Signed)
Recreation Therapy Notes  Date: 03/30/16 Time: 0930 Location: [address] Group Room  Group Topic: Stress Management  Goal Area(s) Addresses:  Patient will verbalize importance of using healthy stress management.  Patient will identify positive emotions associated with healthy stress management.   Behavioral Response: Engaged  Intervention: Stress Management  Activity :  Progressive Muscle Relaxation.  LRT introduced the technique of progressive muscle relaxation to the group.  LRT read script to engage patients in the technique.  Patients were to follow along as LRT read a script to participate in activity.  Education:  Stress Management, Discharge Planning.   Education Outcome: Acknowledges edcuation/In group clarification offered/Needs additional education  Clinical Observations/Feedback: Pt attended group.    Victorino Sparrow, LRT/CTRS         Victorino Sparrow A 03/30/2016 11:37 AM

## 2016-03-30 NOTE — Progress Notes (Signed)
[facility] MD Progress Note  03/30/2016 1:56 PM  Patient Active Problem List   Diagnosis Date Noted  . MDD (major depressive disorder), recurrent episode, severe ([place]) 03/29/2016  . Estrogen deficiency 12/15/2015  . MS (multiple sclerosis) ([place]) 12/15/2015  . Tremor 12/15/2015  . Healthcare maintenance 12/15/2015  . HTN (hypertension)   . Protein-calorie malnutrition, severe 11/09/2015  . Closed left hip fracture ([place]) 11/08/2015  . Bipolar 1 disorder, depressed ([place]) 02/11/2015    Diagnosis: Complicated bereavement and depression  Subjective: The patient reports that she is feeling somewhat better today. She reports she has had less crying. She states "it's going good" and "when can I leave. I just want to be home." She denies any suicidal or homicidal ideation at present. She denies any psychotic symptoms. She denies any trouble with her balance with resuming the primidone and agrees with the plan to advance it over the next day or so to her usual dose of 250 mg by mouth 3 times a day.  Objective: Well developed well nourished white female in no apparent distress pleasant and appropriate mood is "good" affect is slightly anxious and moderately euthymic speech and motor shows ongoing issues with multiple sclerosis and she has a slightly unsteady gait and walks with a cane and has some tremor. Thought processes linear and goal-directed thought content denies any psychosis or suicidal or homicidal ideation, plan or intent alert and oriented 3 insight and judgment are fair IQ appears an average range   Resume systems negative except for MS symptoms     Current Facility-Administered Medications (Cardiovascular):  .  chlorthalidone (HYGROTON) tablet 25 mg     Current Facility-Administered Medications (Analgesics):  .  acetaminophen (TYLENOL) tablet 650 mg     Current Facility-Administered Medications (Other):  .  alum & mag hydroxide-simeth (MAALOX/MYLANTA) 200-200-20 MG/5ML suspension  30 mL .  buPROPion (WELLBUTRIN XL) 24 hr tablet 300 mg .  clonazePAM (KLONOPIN) tablet 0.25 mg .  gabapentin (NEURONTIN) capsule 400 mg .  hydrOXYzine (ATARAX/VISTARIL) tablet 25 mg .  lactose free nutrition (Boost) liquid 237 mL .  magnesium hydroxide (MILK OF MAGNESIA) suspension 30 mL .  multivitamin with minerals tablet 1 tablet .  primidone (MYSOLINE) tablet 200 mg .  traZODone (DESYREL) tablet 50 mg  No current outpatient prescriptions on file.  Vital Signs:Blood pressure 109/75, pulse 94, temperature 99.5 F (37.5 C), temperature source Oral, resp. rate 16, height '5\' 8"'  (1.727 m), weight 60.8 kg (134 lb).    Lab Results:  Results for orders placed or performed during the hospital encounter of 03/29/16 (from the past 48 hour(s))  Basic metabolic panel     Status: Abnormal   Collection Time: 03/29/16  6:13 AM  Result Value Ref Range   Sodium 135 135 - 145 mmol/L   Potassium 3.0 (L) 3.5 - 5.1 mmol/L   Chloride 94 (L) 101 - 111 mmol/L   CO2 32 22 - 32 mmol/L   Glucose, Bld 86 65 - 99 mg/dL   BUN 11 6 - 20 mg/dL   Creatinine, Ser 0.77 0.44 - 1.00 mg/dL   Calcium 9.6 8.9 - 10.3 mg/dL   GFR calc non Af Amer >60 >60 mL/min   GFR calc Af Amer >60 >60 mL/min    Comment: (NOTE) The eGFR has been calculated using the CKD EPI equation. This calculation has not been validated in all clinical situations. eGFR's persistently <60 mL/min signify possible Chronic Kidney Disease.    Anion gap 9 5 - 15  Comment: Performed at [facility]  Hepatic function panel     Status: Abnormal   Collection Time: 03/29/16  6:13 AM  Result Value Ref Range   Total Protein 7.8 6.5 - 8.1 g/dL   Albumin 4.8 3.5 - 5.0 g/dL   AST 23 15 - 41 U/L   ALT 28 14 - 54 U/L   Alkaline Phosphatase 101 38 - 126 U/L   Total Bilirubin 0.6 0.3 - 1.2 mg/dL   Bilirubin, Direct <0.1 (L) 0.1 - 0.5 mg/dL   Indirect Bilirubin NOT CALCULATED 0.3 - 0.9 mg/dL    Comment: Performed at [facility]  Phenobarbital level     Status: Abnormal   Collection Time: 03/29/16  6:13 AM  Result Value Ref Range   Phenobarbital 44.6 (H) 15.0 - 30.0 ug/mL    Comment: Performed at [facility]  Magnesium     Status: None   Collection Time: 03/29/16  6:13 AM  Result Value Ref Range   Magnesium 1.9 1.7 - 2.4 mg/dL    Comment: Performed at [place] metabolic panel     Status: Abnormal   Collection Time: 03/30/16  6:26 AM  Result Value Ref Range   Sodium 135 135 - 145 mmol/L   Potassium 3.4 (L) 3.5 - 5.1 mmol/L   Chloride 94 (L) 101 - 111 mmol/L   CO2 32 22 - 32 mmol/L   Glucose, Bld 100 (H) 65 - 99 mg/dL   BUN 13 6 - 20 mg/dL   Creatinine, Ser 0.81 0.44 - 1.00 mg/dL   Calcium 9.7 8.9 - 10.3 mg/dL   GFR calc non Af Amer >60 >60 mL/min   GFR calc Af Amer >60 >60 mL/min    Comment: (NOTE) The eGFR has been calculated using the CKD EPI equation. This calculation has not been validated in all clinical situations. eGFR's persistently <60 mL/min signify possible Chronic Kidney Disease.    Anion gap 9 5 - 15    Comment: Performed at [facility]    Physical Findings: AIMS: Facial and Oral Movements Muscles of Facial Expression: None, normal Lips and Perioral Area: None, normal Jaw: None, normal Tongue: None, normal,Extremity Movements Upper (arms, wrists, hands, fingers): None, normal Lower (legs, knees, ankles, toes): None, normal, Trunk Movements Neck, shoulders, hips: None, normal, Overall Severity Severity of abnormal movements (highest score from questions above): None, normal Incapacitation due to abnormal movements: None, normal Patient's awareness of abnormal movements (rate only patient's report): No Awareness, Dental Status Current problems with teeth and/or dentures?: No Does patient usually wear dentures?: No  CIWA:  CIWA-Ar Total: 3 COWS:      Assessment/Plan: The patient reports doing much better  today and is beginning to be more future oriented and looking forward to discharge we will continue to advance her medication as tolerated and monitor her mood and safety. At present course continues our plan would be to discharge her after further evaluation on Friday. She does have appointments to follow up with outpatient providers the next week.  Linard Millers, MD 03/30/2016, 1:56 PM

## 2016-03-30 NOTE — Plan of Care (Signed)
Problem: Activity: Goal: Interest or engagement in activities will improve Outcome: Progressing Visible in dayroom; interacts appropriately with staff/peers  Problem: Safety: Goal: Periods of time without injury will increase Outcome: Progressing Safety maintained on unit  Problem: Health Behavior/Discharge Planning: Goal: Compliance with therapeutic regimen will improve Outcome: Progressing Patient compliant with plan of care

## 2016-03-30 NOTE — Progress Notes (Signed)
D:  Patient depressed and anxious; she said that she had one "crying spell" this morning.  She denies suicidal and homicidal ideation and AVH; no self-injurious behaviors noted or reported A:  Medications given as scheduled.  Emotional support provided; encouraged her to seek assistance with needs/concerns R:  Remains on 15 minute safety checks; safety maintained on unit.

## 2016-03-31 MED ORDER — PRIMIDONE 250 MG PO TABS
250.0000 mg | ORAL_TABLET | Freq: Three times a day (TID) | ORAL | Status: DC
  Administered 2016-03-31 – 2016-04-01 (×3): 250 mg via ORAL
  Filled 2016-03-31 (×6): qty 1

## 2016-03-31 NOTE — Progress Notes (Signed)
  D: When asked about her day pt stated, "it wasn't too good". Stated that she "didn't cry and instead held it in".  Pt stated she "thinks it would've been better to let it out". "A good cry".  Pt also questioned the need for trazodone. Pt has no other questions or concerns.   A:  Support and encouragement was offered. 15 min checks continued for safety.  R: Pt remains safe.

## 2016-03-31 NOTE — Progress Notes (Signed)
[facility] MD Progress Note  03/31/2016 10:16 AM  Patient Active Problem List   Diagnosis Date Noted  . MDD (major depressive disorder), recurrent episode, severe ([place]) 03/29/2016  . Estrogen deficiency 12/15/2015  . MS (multiple sclerosis) ([place]) 12/15/2015  . Tremor 12/15/2015  . Healthcare maintenance 12/15/2015  . HTN (hypertension)   . Protein-calorie malnutrition, severe 11/09/2015  . Closed left hip fracture ([place]) 11/08/2015  . Bipolar 1 disorder, depressed ([place]) 02/11/2015    Diagnosis: Depression and complicated bereavement  Subjective: Patient states she had only a so-so day yesterday. She reports she stayed up all day but still didn't sleep well so she feels somewhat tired today. She reports she had some coughing yesterday but denies any upper respiratory symptoms currently. She denies any issues with medication side effects or balance issues from her medication at present and she denies any suicidal or homicidal ideation, plan or intent. She wishes to continue with plan for DC on Friday.  Objective: Well developed well nourished female in no apparent distress who walks with a cane and has some tremor related to her multiple sclerosis diagnosis but appears to ambulate well speech is within normal limits today she is pleasant and cooperative thought processes linear and goal-directed thought content denies any psychosis, or suicidal or homicidal ideation, plan or intent at present. Alert and oriented 3 insight and judgment are fair IQ appears within the average range  Review of systems reports coughing yesterday but denies current URI symptoms, has long-standing tremor and gait problems from MS and also a hip fracture this year other systems negative Latest potassium has normalized.    Current Facility-Administered Medications (Cardiovascular):  .  chlorthalidone (HYGROTON) tablet 25 mg     Current Facility-Administered Medications (Analgesics):  .  acetaminophen (TYLENOL) tablet  650 mg     Current Facility-Administered Medications (Other):  .  alum & mag hydroxide-simeth (MAALOX/MYLANTA) 200-200-20 MG/5ML suspension 30 mL .  buPROPion (WELLBUTRIN XL) 24 hr tablet 300 mg .  clonazePAM (KLONOPIN) tablet 0.25 mg .  gabapentin (NEURONTIN) capsule 400 mg .  hydrOXYzine (ATARAX/VISTARIL) tablet 25 mg .  lactose free nutrition (Boost) liquid 237 mL .  magnesium hydroxide (MILK OF MAGNESIA) suspension 30 mL .  multivitamin with minerals tablet 1 tablet .  primidone (MYSOLINE) tablet 250 mg .  traZODone (DESYREL) tablet 50 mg  No current outpatient prescriptions on file.  Vital Signs:Blood pressure (!) 87/72, pulse 87, temperature 98.2 F (36.8 C), temperature source Oral, resp. rate 18, height '5\' 8"'  (1.727 m), weight 60.8 kg (134 lb), SpO2 99 %.    Lab Results:  Results for orders placed or performed during the hospital encounter of 03/29/16 (from the past 48 hour(s))  Basic metabolic panel     Status: Abnormal   Collection Time: 03/30/16  6:26 AM  Result Value Ref Range   Sodium 135 135 - 145 mmol/L   Potassium 3.4 (L) 3.5 - 5.1 mmol/L   Chloride 94 (L) 101 - 111 mmol/L   CO2 32 22 - 32 mmol/L   Glucose, Bld 100 (H) 65 - 99 mg/dL   BUN 13 6 - 20 mg/dL   Creatinine, Ser 0.81 0.44 - 1.00 mg/dL   Calcium 9.7 8.9 - 10.3 mg/dL   GFR calc non Af Amer >60 >60 mL/min   GFR calc Af Amer >60 >60 mL/min    Comment: (NOTE) The eGFR has been calculated using the CKD EPI equation. This calculation has not been validated in all clinical situations. eGFR's  persistently <60 mL/min signify possible Chronic Kidney Disease.    Anion gap 9 5 - 15    Comment: Performed at [facility]    Physical Findings: AIMS: Facial and Oral Movements Muscles of Facial Expression: None, normal Lips and Perioral Area: None, normal Jaw: None, normal Tongue: None, normal,Extremity Movements Upper (arms, wrists, hands, fingers): None, normal Lower (legs, knees,  ankles, toes): None, normal, Trunk Movements Neck, shoulders, hips: None, normal, Overall Severity Severity of abnormal movements (highest score from questions above): None, normal Incapacitation due to abnormal movements: None, normal Patient's awareness of abnormal movements (rate only patient's report): No Awareness, Dental Status Current problems with teeth and/or dentures?: No Does patient usually wear dentures?: No  CIWA:  CIWA-Ar Total: 3 COWS:      Assessment/Plan: Ms. Ruybal condition has stabilized and she appears ready to continue with her outpatient follow-up to work on her long-term issues of complicated bereavement. We will increase her primidone to her usual outpatient dose as she appears to be tolerating it well without any balance issues. Current plan is to discharge her with peers or prescriptions or orders for Klonopin gabapentin and Wellbutrin. She may not wish to continue the trazodone. She will be discharged tomorrow morning if present course continuous.  Linard Millers, MD 03/31/2016, 10:16 AM

## 2016-03-31 NOTE — BHH Group Notes (Signed)
[facility] Mental Health Association Group Therapy 03/31/2016 1:15pm  Type of Therapy: Mental Health Association Presentation  Participation Level: Active  Participation Quality: Attentive  Affect: Appropriate  Cognitive: Oriented  Insight: Developing/Improving  Engagement in Therapy: Engaged  Modes of Intervention: Discussion, Education and Socialization  Summary of Progress/Problems: Mental Health Association ([place]) Speaker came to talk about his personal journey with substance abuse and addiction. The pt processed ways by which to relate to the speaker. [place] speaker provided handouts and educational information pertaining to groups and services offered by the [facility]. Pt was engaged in speaker's presentation and was receptive to resources provided.    Peri Maris, LCSWA 03/31/2016 1:35 PM

## 2016-03-31 NOTE — BHH Suicide Risk Assessment (Signed)
[facility] Discharge Suicide Risk Assessment   Principal Problem: MDD (major depressive disorder), recurrent episode, severe ([place]) Discharge Diagnoses:  Patient Active Problem List   Diagnosis Date Noted  . MDD (major depressive disorder), recurrent episode, severe ([place]) [F33.2] 03/29/2016  . Estrogen deficiency [E28.39] 12/15/2015  . MS (multiple sclerosis) ([place]) [G35] 12/15/2015  . Tremor [R25.1] 12/15/2015  . Healthcare maintenance [Z00.00] 12/15/2015  . HTN (hypertension) [I10]   . Protein-calorie malnutrition, severe [E43] 11/09/2015  . Closed left hip fracture ([place]) [S72.002A] 11/08/2015  . Bipolar 1 disorder, depressed ([place]) [F31.9] 02/11/2015    Total Time spent with patient: 15 minutes  Musculoskeletal: Strength & Muscle Tone: decreased Gait & Station: unsteady Patient leans: N/A  Psychiatric Specialty Exam: ROS relates long-standing tremor and unsteady gait from MS   Blood pressure (!) 87/72, pulse 87, temperature 98.2 F (36.8 C), temperature source Oral, resp. rate 18, height 5\' 8"  (1.727 m), weight 60.8 kg (134 lb), SpO2 99 %.Body mass index is 20.37 kg/m.  General Appearance: Fairly Groomed  Engineer, water::  Good  Speech:  Clear and Coherent409  Volume:  Normal  Mood:  Anxious  Affect:  Congruent  Thought Process:  Coherent and Goal Directed  Orientation:  Full (Time, Place, and Person)  Thought Content:  Negative  Suicidal Thoughts:  No  Homicidal Thoughts:  No  Memory:  Negative  Judgement:  Fair  Insight:  Fair  Psychomotor Activity:  Tremor  Concentration:  Fair  Recall:  Good  Fund of Knowledge:Good  Language: Good  Akathisia:  No  Handed:  Right  AIMS (if indicated):     Assets:  Resilience  Sleep:  Number of Hours: 6.75  Cognition: WNL  ADL's:  Intact   Mental Status Per Nursing Assessment::   On Admission:  Suicidal ideation indicated by patient  Demographic Factors:  NA  Loss Factors: Loss of significant relationship  Historical  Factors: Family history of suicide and Family history of mental illness or substance abuse  Risk Reduction Factors:   Sense of responsibility to family and Living with another person, especially a relative  Continued Clinical Symptoms:  Severe Anxiety and/or Agitation Dysthymia Medical Diagnoses and Treatments/Surgeries  Cognitive Features That Contribute To Risk:  None    Suicide Risk:  Mild:  Suicidal ideation of limited frequency, intensity, duration, and specificity.  There are no identifiable plans, no associated intent, mild dysphoria and related symptoms, good self-control (both objective and subjective assessment), few other risk factors, and identifiable protective factors, including available and accessible social support.  Follow-up Information    [facility] Follow up on 04/05/2016.   Specialty:  Behavioral Health Why:  Tuesday at 10:30 for medication management, then Wednesday, Oct 11 at 11:00 for appointment with the therapist, Dr Sharee Holster information: [address] [place] [address] [place] recommendations:  Other:  The patient was admitted after feeling overwhelmed and having suicidal ideation. She reports that she has had a very difficult time dealing with the death of her son about a year and a half ago. She does have appointments to follow up with outpatient mental health and she is encouraged to continue with this. She should continue to take her psychiatric meds as prescribed. She currently denies any suicidal or homicidal ideation, plan or intent and is looking forward to going home.  Linard Millers, MD 03/31/2016, 3:01 PM

## 2016-03-31 NOTE — BHH Group Notes (Signed)
The focus of this group is to educate the patient on the purpose and policies of crisis stabilization and provide a format to answer questions about their admission.  The group details unit policies and expectations of patients while admitted.  Patient attended 0900 nurse education orientation group this morning.  Patient actively participated and had appropriate affect.  Patient was alert.  Patient had appropriate insight and appropriate engagement.  Today patient will work on 3 goals for discharge.  Patient would like to talk to chaplain, call was made to chaplain's office.

## 2016-03-31 NOTE — Progress Notes (Signed)
D    Pt is pleasant on approach and cooperative   She interacts appropriately with staff and peers    She is compliant with treatment  A    Verbal support given   Medications administered and effectiveness monitored    Q 15 min checks R    Pt is safe and reports she feels so much better and is ready for discharge

## 2016-04-01 MED ORDER — HYDROXYZINE HCL 25 MG PO TABS
25.0000 mg | ORAL_TABLET | Freq: Four times a day (QID) | ORAL | 0 refills | Status: DC | PRN
Start: 2016-04-01 — End: 2017-01-04

## 2016-04-01 MED ORDER — TRAZODONE HCL 50 MG PO TABS: 50.0000 mg | ORAL_TABLET | Freq: Every evening | ORAL | 0 refills | Status: DC | PRN

## 2016-04-01 MED ORDER — BUPROPION HCL ER (XL) 300 MG PO TB24: 300.0000 mg | ORAL_TABLET | Freq: Every day | ORAL | 0 refills | Status: DC

## 2016-04-01 MED ORDER — PRIMIDONE 250 MG PO TABS: 250.0000 mg | ORAL_TABLET | Freq: Three times a day (TID) | ORAL | 0 refills | Status: DC

## 2016-04-01 MED ORDER — CHLORTHALIDONE 25 MG PO TABS: 25.0000 mg | ORAL_TABLET | Freq: Every day | ORAL | 0 refills | Status: DC

## 2016-04-01 MED ORDER — GABAPENTIN 400 MG PO CAPS: 400.0000 mg | ORAL_CAPSULE | Freq: Three times a day (TID) | ORAL | 0 refills | Status: DC

## 2016-04-01 NOTE — Progress Notes (Signed)
Recreation Therapy Notes  Date: 04/01/16 Time: 0930 Location: [address] Dayroom  Group Topic: Stress Management  Goal Area(s) Addresses:  Patient will verbalize importance of using healthy stress management.  Patient will identify positive emotions associated with healthy stress management.   Behavioral Response: Engaged  Intervention: Stress Management  Activity :  [employer].  LRT introduced the technique of guided imagery.  LRT read script allowing patients to participate and engage in the technique.  Patients were to follow along as LRT read script.  Education:  Stress Management, Discharge Planning.   Education Outcome: Acknowledges edcuation/In group clarification offered/Needs additional education  Clinical Observations/Feedback: Pt attended group.    Victorino Sparrow, LRT/CTRS    Victorino Sparrow A 04/01/2016 11:42 AM

## 2016-04-01 NOTE — Discharge Summary (Signed)
Physician Discharge Summary Note  Patient:  Angel Maddox is an 60 y.o., female MRN:  [id number] DOB:  Jan 01, 1956 Patient phone:  [phone] (home)  Patient address:   [place] [address],  Total Time spent with patient: 30 minutes  Date of Admission:  03/29/2016 Date of Discharge: 04/01/2016  Reason for Admission:  depression  Principal Problem: MDD (major depressive disorder), recurrent episode, severe [facility]) Discharge Diagnoses: Patient Active Problem List   Diagnosis Date Noted  . MDD (major depressive disorder), recurrent episode, severe ([place]) [F33.2] 03/29/2016  . Estrogen deficiency [E28.39] 12/15/2015  . MS (multiple sclerosis) ([place]) [G35] 12/15/2015  . Tremor [R25.1] 12/15/2015  . Healthcare maintenance [Z00.00] 12/15/2015  . HTN (hypertension) [I10]   . Protein-calorie malnutrition, severe [E43] 11/09/2015  . Closed left hip fracture ([place]) [S72.002A] 11/08/2015  . Bipolar 1 disorder, depressed ([place]) [F31.9] 02/11/2015    Past Psychiatric History: see HPI  Past Medical History:  Past Medical History:  Diagnosis Date  . Anxiety   . Bipolar 1 disorder ([place])   . Chronic kidney disease    kidney stone  . Depression   . H/O hiatal hernia   . Hypertension   . Multiple sclerosis ([place])     Had for 15 years  . Tremors of nervous system     Past Surgical History:  Procedure Laterality Date  . HIP PINNING,CANNULATED Left 11/09/2015   Procedure: CANNULATED HIP PINNING;  Surgeon: Rod Can, MD;  Location: [facility];  Service: Orthopedics;  Laterality: Left;  . LIPOMA EXCISION    . TUBAL LIGATION     Family History:  Family History  Problem Relation Age of Onset  . Heart attack Mother    Family Psychiatric  History: see HPI Social History:  History  Alcohol Use  . Yes    Comment: wine once a month     History  Drug Use No    Social History   Social History  . Marital status: Married    Spouse name: N/A  . Number of children:  N/A  . Years of education: N/A   Social History Main Topics  . Smoking status: Former Smoker    Types: Cigarettes    Quit date: 01/25/1991  . Smokeless tobacco: Never Used  . Alcohol use Yes     Comment: wine once a month  . Drug use: No  . Sexual activity: Not Asked   Other Topics Concern  . None   Social History Narrative  . None    Hospital Course:  Angel Maddox, 60 yo was admitted to [facility] for MDD.  Patient reported a mental history of bipolar disorder and had been prescribed Lamictal and Risperidone in the past.  She reports that she had been relatively stable until recently.  She had a significant life changing event in Feb 2016, when she lost her son to a heroin overdose.  She is unable to specify a specific trigger that brought on this recent crisis.  However, she relives the moment she found her son dead.  Angel Maddox was admitted for MDD (major depressive disorder), recurrent episode, severe ([place]) and crisis management.  Patient was treated with medications with their indications listed below in detail under Medication List.  Medical problems were identified and treated as needed.  Home medications were restarted as appropriate.  Improvement was monitored by observation and Angel Maddox daily report of symptom reduction.  Emotional and mental status was monitored by daily self inventory  reports completed by Angel Maddox and clinical staff.  Patient reported continued improvement, denied any new concerns.  Patient had been compliant on medications and denied side effects.  Support and encouragement was provided.    Patient encouraged to attend groups to help with recognizing triggers of emotional crises and de-stabilizations.  Patient encouraged to attend group to help identify the positive things in life that would help in dealing with feelings of loss, depression and unhealthy or abusive tendencies.         Angel Maddox was evaluated by the treatment team for  stability and plans for continued recovery upon discharge.  Patient was offered further treatment options upon discharge including Residential, Intensive Outpatient and Outpatient treatment. Patient will follow up with agency listed below for medication management and counseling.  Encouraged patient to maintain satisfactory support network and home environment.  Advised to adhere to medication compliance and outpatient treatment follow up.  Prescriptions provided.       Angel Maddox motivation was an integral factor for scheduling further treatment.  Employment, transportation, bed availability, health status, family support, and any pending legal issues were also considered during patient's hospital stay.  Upon completion of this admission the patient was both mentally and medically stable for discharge denying suicidal/homicidal ideation, auditory/visual/tactile hallucinations, delusional thoughts and paranoia.      Physical Findings: AIMS: Facial and Oral Movements Muscles of Facial Expression: None, normal Lips and Perioral Area: None, normal Jaw: None, normal Tongue: None, normal,Extremity Movements Upper (arms, wrists, hands, fingers): None, normal Lower (legs, knees, ankles, toes): None, normal, Trunk Movements Neck, shoulders, hips: None, normal, Overall Severity Severity of abnormal movements (highest score from questions above): None, normal Incapacitation due to abnormal movements: None, normal Patient's awareness of abnormal movements (rate only patient's report): No Awareness, Dental Status Current problems with teeth and/or dentures?: No Does patient usually wear dentures?: No  CIWA:  CIWA-Ar Total: 3 COWS:     Musculoskeletal: Strength & Muscle Tone: within normal limits Gait & Station: normal Patient leans: N/A  Psychiatric Specialty Exam: Physical Exam  Nursing note and vitals reviewed. Psychiatric: She has a normal mood and affect. Her speech is normal and behavior  is normal. Judgment and thought content normal. Thought content is not paranoid. Cognition and memory are normal. She does not exhibit a depressed mood. She expresses no homicidal and no suicidal ideation.    Review of Systems  Constitutional: Negative.   HENT: Negative.   Eyes: Negative.   Respiratory: Negative.   Cardiovascular: Negative.   Gastrointestinal: Negative.   Genitourinary: Negative.   Musculoskeletal: Negative.   Skin: Negative.   Neurological: Negative.   Endo/Heme/Allergies: Negative.   Psychiatric/Behavioral: Negative.   All other systems reviewed and are negative.   Blood pressure (P) 90/71, pulse (P) 97, temperature (P) 98.1 F (36.7 C), resp. rate (P) 18, height 5\' 8"  (1.727 m), weight 60.8 kg (134 lb), SpO2 99 %.Body mass index is 20.37 kg/m.    Have you used any form of tobacco in the last 30 days? (Cigarettes, Smokeless Tobacco, Cigars, and/or Pipes): No  Has this patient used any form of tobacco in the last 30 days? (Cigarettes, Smokeless Tobacco, Cigars, and/or Pipes) Yes, N/A  Blood Alcohol level:  Lab Results  Component Value Date   [facility] <5 03/28/2016   ETH <5 [id number]    Metabolic Disorder Labs:  Lab Results  Component Value Date   HGBA1C 5.2 08/28/2015   No results found for: PROLACTIN  No results found for: CHOL, TRIG, HDL, CHOLHDL, VLDL, LDLCALC  See Psychiatric Specialty Exam and Suicide Risk Assessment completed by Attending Physician prior to discharge.  Discharge destination:  Home  Is patient on multiple antipsychotic therapies at discharge:  No   Has Patient had three or more failed trials of antipsychotic monotherapy by history:  No  Recommended Plan for Multiple Antipsychotic Therapies: NA     Medication List    STOP taking these medications   citalopram 40 MG tablet Commonly known as:  CELEXA   clonazePAM 0.5 MG tablet Commonly known as:  KLONOPIN   feeding supplement (ENSURE ENLIVE) Liqd   lactose free nutrition  Liqd   multivitamin with minerals Tabs tablet   risperiDONE 1 MG tablet Commonly known as:  RISPERDAL     TAKE these medications     Indication  buPROPion 300 MG 24 hr tablet Commonly known as:  WELLBUTRIN XL Take 1 tablet (300 mg total) by mouth daily. Start taking on:  04/02/2016  Indication:  Major Depressive Disorder   chlorthalidone 25 MG tablet Commonly known as:  HYGROTON Take 1 tablet (25 mg total) by mouth daily. Start taking on:  04/02/2016 What changed:  See the new instructions.  Indication:  High Blood Pressure Disorder   gabapentin 400 MG capsule Commonly known as:  NEURONTIN Take 1 capsule (400 mg total) by mouth 3 (three) times daily. What changed:  Another medication with the same name was removed. Continue taking this medication, and follow the directions you see here.  Indication:  Agitation   hydrOXYzine 25 MG tablet Commonly known as:  ATARAX/VISTARIL Take 1 tablet (25 mg total) by mouth every 6 (six) hours as needed for anxiety.  Indication:  Anxiety Neurosis   primidone 250 MG tablet Commonly known as:  MYSOLINE Take 1 tablet (250 mg total) by mouth every 8 (eight) hours. What changed:  See the new instructions.  Indication:  Fine to Coarse Slow Tremor affecting Head, Hands & Voice   traZODone 50 MG tablet Commonly known as:  DESYREL Take 1 tablet (50 mg total) by mouth at bedtime and may repeat dose one time if needed.  Indication:  Anxiety Disorder      Follow-up Information    [facility] Follow up on 04/05/2016.   Specialty:  Behavioral Health Why:  Tuesday at 10:30 for medication management, then Wednesday, Oct 11 at 11:00 for appointment with the therapist, Dr Sharee Holster information: [place] [address] [phone]           Follow-up recommendations:  Activity:  as tol Diet:  as tol  Comments:  1.  Take all your medications as prescribed.   2.  Report any adverse side  effects to outpatient provider. 3.  Patient instructed to not use alcohol or illegal drugs while on prescription medicines. 4.  In the event of worsening symptoms, instructed patient to call 911, the crisis hotline or go to nearest emergency room for evaluation of symptoms.  Signed: Janett Labella, NP [facility] 04/01/2016, 10:34 AM

## 2016-04-01 NOTE — Progress Notes (Signed)
  [facility] Adult Case Management Discharge Plan :  Will you be returning to the same living situation after discharge:  Yes,  home  At discharge, do you have transportation home?: Yes,  husband  Do you have the ability to pay for your medications: Yes,  insurance   Release of information consent forms completed and in the chart;  Patient's signature needed at discharge.  Patient to Follow up at: Follow-up Information    [facility] Follow up on 04/05/2016.   Specialty:  Behavioral Health Why:  Tuesday at 10:30 for medication management, then Wednesday, Oct 11 at 11:00 for appointment with the therapist, Dr Sharee Holster information: [place] [place] [address] [phone]           Next level of care provider has access to [place] and Suicide Prevention discussed: Yes,  with patient and husband   Have you used any form of tobacco in the last 30 days? (Cigarettes, Smokeless Tobacco, Cigars, and/or Pipes): No  Has patient been referred to the Quitline?: N/A patient is not a smoker  Patient has been referred for addiction treatment: [place] MSW, [place]  04/01/2016, 10:06 AM

## 2016-04-01 NOTE — Progress Notes (Signed)
Patient ID: Angel Maddox, female   DOB: 11/11/1955, 60 y.o.   MRN: [id number] Patient discharged to home/self care.  Patient stated "I am excited to leave.  I I just want to get home to my family. Its hard to be away from them.  Patient was noted to be bright and smiling upon discharge and was able to contract with safety."  Patient acknowledged understanding of all discharge instructions and receipt of all belongings.

## 2016-04-05 DIAGNOSIS — F411 Generalized anxiety disorder: Secondary | ICD-10-CM | POA: Diagnosis not present

## 2016-04-19 DIAGNOSIS — F411 Generalized anxiety disorder: Secondary | ICD-10-CM | POA: Diagnosis not present

## 2016-04-26 DIAGNOSIS — F411 Generalized anxiety disorder: Secondary | ICD-10-CM | POA: Diagnosis not present

## 2016-04-27 DIAGNOSIS — S72042D Displaced fracture of base of neck of left femur, subsequent encounter for closed fracture with routine healing: Secondary | ICD-10-CM | POA: Diagnosis not present

## 2016-04-27 DIAGNOSIS — M7062 Trochanteric bursitis, left hip: Secondary | ICD-10-CM | POA: Diagnosis not present

## 2016-05-09 DIAGNOSIS — F411 Generalized anxiety disorder: Secondary | ICD-10-CM | POA: Diagnosis not present

## 2016-05-25 DIAGNOSIS — F411 Generalized anxiety disorder: Secondary | ICD-10-CM | POA: Diagnosis not present

## 2016-05-30 DIAGNOSIS — F411 Generalized anxiety disorder: Secondary | ICD-10-CM | POA: Diagnosis not present

## 2016-06-08 ENCOUNTER — Other Ambulatory Visit: Payer: Self-pay | Admitting: Family Medicine

## 2016-06-15 ENCOUNTER — Other Ambulatory Visit: Payer: Self-pay | Admitting: Family Medicine

## 2016-06-15 MED ORDER — CHLORTHALIDONE 25 MG PO TABS: 25.0000 mg | ORAL_TABLET | Freq: Every day | ORAL | 0 refills | Status: DC

## 2016-06-15 NOTE — Telephone Encounter (Signed)
Done, called pt, she is only on the one bp pill

## 2016-06-16 DIAGNOSIS — M9902 Segmental and somatic dysfunction of thoracic region: Secondary | ICD-10-CM | POA: Diagnosis not present

## 2016-06-16 DIAGNOSIS — M9901 Segmental and somatic dysfunction of cervical region: Secondary | ICD-10-CM | POA: Diagnosis not present

## 2016-06-16 DIAGNOSIS — M5137 Other intervertebral disc degeneration, lumbosacral region: Secondary | ICD-10-CM | POA: Diagnosis not present

## 2016-06-16 DIAGNOSIS — M9903 Segmental and somatic dysfunction of lumbar region: Secondary | ICD-10-CM | POA: Diagnosis not present

## 2016-06-21 DIAGNOSIS — M9901 Segmental and somatic dysfunction of cervical region: Secondary | ICD-10-CM | POA: Diagnosis not present

## 2016-06-21 DIAGNOSIS — M9902 Segmental and somatic dysfunction of thoracic region: Secondary | ICD-10-CM | POA: Diagnosis not present

## 2016-06-21 DIAGNOSIS — M9903 Segmental and somatic dysfunction of lumbar region: Secondary | ICD-10-CM | POA: Diagnosis not present

## 2016-06-21 DIAGNOSIS — M5137 Other intervertebral disc degeneration, lumbosacral region: Secondary | ICD-10-CM | POA: Diagnosis not present

## 2016-06-22 DIAGNOSIS — M5137 Other intervertebral disc degeneration, lumbosacral region: Secondary | ICD-10-CM | POA: Diagnosis not present

## 2016-06-22 DIAGNOSIS — M9903 Segmental and somatic dysfunction of lumbar region: Secondary | ICD-10-CM | POA: Diagnosis not present

## 2016-06-22 DIAGNOSIS — M9901 Segmental and somatic dysfunction of cervical region: Secondary | ICD-10-CM | POA: Diagnosis not present

## 2016-06-22 DIAGNOSIS — M9902 Segmental and somatic dysfunction of thoracic region: Secondary | ICD-10-CM | POA: Diagnosis not present

## 2016-06-23 DIAGNOSIS — M9901 Segmental and somatic dysfunction of cervical region: Secondary | ICD-10-CM | POA: Diagnosis not present

## 2016-06-23 DIAGNOSIS — M9903 Segmental and somatic dysfunction of lumbar region: Secondary | ICD-10-CM | POA: Diagnosis not present

## 2016-06-23 DIAGNOSIS — M5137 Other intervertebral disc degeneration, lumbosacral region: Secondary | ICD-10-CM | POA: Diagnosis not present

## 2016-06-23 DIAGNOSIS — M9902 Segmental and somatic dysfunction of thoracic region: Secondary | ICD-10-CM | POA: Diagnosis not present

## 2016-06-28 DIAGNOSIS — M9902 Segmental and somatic dysfunction of thoracic region: Secondary | ICD-10-CM | POA: Diagnosis not present

## 2016-06-28 DIAGNOSIS — M9901 Segmental and somatic dysfunction of cervical region: Secondary | ICD-10-CM | POA: Diagnosis not present

## 2016-06-28 DIAGNOSIS — M5137 Other intervertebral disc degeneration, lumbosacral region: Secondary | ICD-10-CM | POA: Diagnosis not present

## 2016-06-28 DIAGNOSIS — M9903 Segmental and somatic dysfunction of lumbar region: Secondary | ICD-10-CM | POA: Diagnosis not present

## 2016-06-29 DIAGNOSIS — M5137 Other intervertebral disc degeneration, lumbosacral region: Secondary | ICD-10-CM | POA: Diagnosis not present

## 2016-06-29 DIAGNOSIS — F411 Generalized anxiety disorder: Secondary | ICD-10-CM | POA: Diagnosis not present

## 2016-06-29 DIAGNOSIS — M9901 Segmental and somatic dysfunction of cervical region: Secondary | ICD-10-CM | POA: Diagnosis not present

## 2016-06-29 DIAGNOSIS — M9902 Segmental and somatic dysfunction of thoracic region: Secondary | ICD-10-CM | POA: Diagnosis not present

## 2016-06-29 DIAGNOSIS — M9903 Segmental and somatic dysfunction of lumbar region: Secondary | ICD-10-CM | POA: Diagnosis not present

## 2016-06-30 DIAGNOSIS — M9903 Segmental and somatic dysfunction of lumbar region: Secondary | ICD-10-CM | POA: Diagnosis not present

## 2016-06-30 DIAGNOSIS — M9902 Segmental and somatic dysfunction of thoracic region: Secondary | ICD-10-CM | POA: Diagnosis not present

## 2016-06-30 DIAGNOSIS — M5137 Other intervertebral disc degeneration, lumbosacral region: Secondary | ICD-10-CM | POA: Diagnosis not present

## 2016-06-30 DIAGNOSIS — M9901 Segmental and somatic dysfunction of cervical region: Secondary | ICD-10-CM | POA: Diagnosis not present

## 2016-07-04 DIAGNOSIS — M9903 Segmental and somatic dysfunction of lumbar region: Secondary | ICD-10-CM | POA: Diagnosis not present

## 2016-07-04 DIAGNOSIS — M5137 Other intervertebral disc degeneration, lumbosacral region: Secondary | ICD-10-CM | POA: Diagnosis not present

## 2016-07-04 DIAGNOSIS — M9902 Segmental and somatic dysfunction of thoracic region: Secondary | ICD-10-CM | POA: Diagnosis not present

## 2016-07-04 DIAGNOSIS — M9901 Segmental and somatic dysfunction of cervical region: Secondary | ICD-10-CM | POA: Diagnosis not present

## 2016-07-06 DIAGNOSIS — M9901 Segmental and somatic dysfunction of cervical region: Secondary | ICD-10-CM | POA: Diagnosis not present

## 2016-07-06 DIAGNOSIS — M9902 Segmental and somatic dysfunction of thoracic region: Secondary | ICD-10-CM | POA: Diagnosis not present

## 2016-07-06 DIAGNOSIS — M9903 Segmental and somatic dysfunction of lumbar region: Secondary | ICD-10-CM | POA: Diagnosis not present

## 2016-07-06 DIAGNOSIS — M5137 Other intervertebral disc degeneration, lumbosacral region: Secondary | ICD-10-CM | POA: Diagnosis not present

## 2016-07-06 DIAGNOSIS — F411 Generalized anxiety disorder: Secondary | ICD-10-CM | POA: Diagnosis not present

## 2016-07-07 DIAGNOSIS — M9903 Segmental and somatic dysfunction of lumbar region: Secondary | ICD-10-CM | POA: Diagnosis not present

## 2016-07-07 DIAGNOSIS — M9902 Segmental and somatic dysfunction of thoracic region: Secondary | ICD-10-CM | POA: Diagnosis not present

## 2016-07-07 DIAGNOSIS — M5137 Other intervertebral disc degeneration, lumbosacral region: Secondary | ICD-10-CM | POA: Diagnosis not present

## 2016-07-07 DIAGNOSIS — M9901 Segmental and somatic dysfunction of cervical region: Secondary | ICD-10-CM | POA: Diagnosis not present

## 2016-07-11 DIAGNOSIS — M5137 Other intervertebral disc degeneration, lumbosacral region: Secondary | ICD-10-CM | POA: Diagnosis not present

## 2016-07-11 DIAGNOSIS — M9903 Segmental and somatic dysfunction of lumbar region: Secondary | ICD-10-CM | POA: Diagnosis not present

## 2016-07-11 DIAGNOSIS — M9902 Segmental and somatic dysfunction of thoracic region: Secondary | ICD-10-CM | POA: Diagnosis not present

## 2016-07-11 DIAGNOSIS — M9901 Segmental and somatic dysfunction of cervical region: Secondary | ICD-10-CM | POA: Diagnosis not present

## 2016-07-12 DIAGNOSIS — M9903 Segmental and somatic dysfunction of lumbar region: Secondary | ICD-10-CM | POA: Diagnosis not present

## 2016-07-12 DIAGNOSIS — M9901 Segmental and somatic dysfunction of cervical region: Secondary | ICD-10-CM | POA: Diagnosis not present

## 2016-07-12 DIAGNOSIS — M9902 Segmental and somatic dysfunction of thoracic region: Secondary | ICD-10-CM | POA: Diagnosis not present

## 2016-07-12 DIAGNOSIS — M5137 Other intervertebral disc degeneration, lumbosacral region: Secondary | ICD-10-CM | POA: Diagnosis not present

## 2016-07-18 DIAGNOSIS — M9903 Segmental and somatic dysfunction of lumbar region: Secondary | ICD-10-CM | POA: Diagnosis not present

## 2016-07-18 DIAGNOSIS — M9902 Segmental and somatic dysfunction of thoracic region: Secondary | ICD-10-CM | POA: Diagnosis not present

## 2016-07-18 DIAGNOSIS — M9901 Segmental and somatic dysfunction of cervical region: Secondary | ICD-10-CM | POA: Diagnosis not present

## 2016-07-18 DIAGNOSIS — M5137 Other intervertebral disc degeneration, lumbosacral region: Secondary | ICD-10-CM | POA: Diagnosis not present

## 2016-07-18 DIAGNOSIS — F411 Generalized anxiety disorder: Secondary | ICD-10-CM | POA: Diagnosis not present

## 2016-07-20 DIAGNOSIS — M9902 Segmental and somatic dysfunction of thoracic region: Secondary | ICD-10-CM | POA: Diagnosis not present

## 2016-07-20 DIAGNOSIS — M5137 Other intervertebral disc degeneration, lumbosacral region: Secondary | ICD-10-CM | POA: Diagnosis not present

## 2016-07-20 DIAGNOSIS — M9903 Segmental and somatic dysfunction of lumbar region: Secondary | ICD-10-CM | POA: Diagnosis not present

## 2016-07-20 DIAGNOSIS — M9901 Segmental and somatic dysfunction of cervical region: Secondary | ICD-10-CM | POA: Diagnosis not present

## 2016-07-21 DIAGNOSIS — M5137 Other intervertebral disc degeneration, lumbosacral region: Secondary | ICD-10-CM | POA: Diagnosis not present

## 2016-07-21 DIAGNOSIS — M9902 Segmental and somatic dysfunction of thoracic region: Secondary | ICD-10-CM | POA: Diagnosis not present

## 2016-07-21 DIAGNOSIS — M9903 Segmental and somatic dysfunction of lumbar region: Secondary | ICD-10-CM | POA: Diagnosis not present

## 2016-07-21 DIAGNOSIS — M9901 Segmental and somatic dysfunction of cervical region: Secondary | ICD-10-CM | POA: Diagnosis not present

## 2016-07-25 ENCOUNTER — Encounter (HOSPITAL_COMMUNITY): Payer: Self-pay | Admitting: Emergency Medicine

## 2016-07-25 ENCOUNTER — Emergency Department (HOSPITAL_COMMUNITY)
Admission: EM | Admit: 2016-07-25 | Discharge: 2016-07-26 | Disposition: A | Discharge status: Home / Self Care | Payer: Medicare Other | Attending: Emergency Medicine | Admitting: Emergency Medicine

## 2016-07-25 ENCOUNTER — Emergency Department (HOSPITAL_COMMUNITY): Payer: Medicare Other

## 2016-07-25 DIAGNOSIS — N189 Chronic kidney disease, unspecified: Secondary | ICD-10-CM | POA: Insufficient documentation

## 2016-07-25 DIAGNOSIS — M9901 Segmental and somatic dysfunction of cervical region: Secondary | ICD-10-CM | POA: Diagnosis not present

## 2016-07-25 DIAGNOSIS — F319 Bipolar disorder, unspecified: Secondary | ICD-10-CM | POA: Diagnosis not present

## 2016-07-25 DIAGNOSIS — I129 Hypertensive chronic kidney disease with stage 1 through stage 4 chronic kidney disease, or unspecified chronic kidney disease: Secondary | ICD-10-CM | POA: Diagnosis not present

## 2016-07-25 DIAGNOSIS — M9903 Segmental and somatic dysfunction of lumbar region: Secondary | ICD-10-CM | POA: Diagnosis not present

## 2016-07-25 DIAGNOSIS — Z87891 Personal history of nicotine dependence: Secondary | ICD-10-CM | POA: Diagnosis not present

## 2016-07-25 DIAGNOSIS — R45851 Suicidal ideations: Secondary | ICD-10-CM

## 2016-07-25 DIAGNOSIS — M9902 Segmental and somatic dysfunction of thoracic region: Secondary | ICD-10-CM | POA: Diagnosis not present

## 2016-07-25 DIAGNOSIS — M545 Low back pain: Secondary | ICD-10-CM | POA: Diagnosis not present

## 2016-07-25 DIAGNOSIS — M549 Dorsalgia, unspecified: Secondary | ICD-10-CM | POA: Diagnosis not present

## 2016-07-25 DIAGNOSIS — Z79899 Other long term (current) drug therapy: Secondary | ICD-10-CM | POA: Insufficient documentation

## 2016-07-25 DIAGNOSIS — M542 Cervicalgia: Secondary | ICD-10-CM | POA: Diagnosis not present

## 2016-07-25 DIAGNOSIS — Z9851 Tubal ligation status: Secondary | ICD-10-CM | POA: Diagnosis not present

## 2016-07-25 DIAGNOSIS — M5137 Other intervertebral disc degeneration, lumbosacral region: Secondary | ICD-10-CM | POA: Diagnosis not present

## 2016-07-25 DIAGNOSIS — Z9889 Other specified postprocedural states: Secondary | ICD-10-CM | POA: Diagnosis not present

## 2016-07-25 DIAGNOSIS — Z8249 Family history of ischemic heart disease and other diseases of the circulatory system: Secondary | ICD-10-CM | POA: Diagnosis not present

## 2016-07-25 LAB — SALICYLATE LEVEL: Salicylate Lvl: <7 mg/dL (ref 2.8–30.0)

## 2016-07-25 LAB — CBC
HEMATOCRIT: 34.7 % — AB (ref 36.0–46.0)
HEMOGLOBIN: 12.2 g/dL (ref 12.0–15.0)
MCH: 30.6 pg (ref 26.0–34.0)
MCHC: 35.2 g/dL (ref 30.0–36.0)
MCV: 87 fL (ref 78.0–100.0)
Platelets: 232 10*3/uL (ref 150–400)
RBC: 3.99 MIL/uL (ref 3.87–5.11)
RDW: 12.2 % (ref 11.5–15.5)
WBC: 4.2 10*3/uL (ref 4.0–10.5)

## 2016-07-25 LAB — COMPREHENSIVE METABOLIC PANEL
ALT: 21 U/L (ref 14–54)
AST: 23 U/L (ref 15–41)
Albumin: 4.2 g/dL (ref 3.5–5.0)
Alkaline Phosphatase: 82 U/L (ref 38–126)
Anion gap: 9 (ref 5–15)
BUN: 12 mg/dL (ref 6–20)
CHLORIDE: 87 mmol/L — AB (ref 101–111)
CO2: 31 mmol/L (ref 22–32)
CREATININE: 0.79 mg/dL (ref 0.44–1.00)
Calcium: 9.3 mg/dL (ref 8.9–10.3)
GFR calc Af Amer: >60 mL/min (ref >60)
GLUCOSE: 87 mg/dL (ref 65–99)
Potassium: 3.9 mmol/L (ref 3.5–5.1)
SODIUM: 127 mmol/L — AB (ref 135–145)
Total Bilirubin: 0.5 mg/dL (ref 0.3–1.2)
Total Protein: 6.9 g/dL (ref 6.5–8.1)

## 2016-07-25 LAB — RAPID URINE DRUG SCREEN, HOSP PERFORMED
AMPHETAMINES: NOT DETECTED
Barbiturates: POSITIVE — AB
Benzodiazepines: NOT DETECTED
Cocaine: NOT DETECTED
Opiates: NOT DETECTED
TETRAHYDROCANNABINOL: NOT DETECTED

## 2016-07-25 LAB — ACETAMINOPHEN LEVEL: Acetaminophen (Tylenol), Serum: <10 ug/mL — ABNORMAL LOW (ref 10–30)

## 2016-07-25 LAB — ETHANOL: Alcohol, Ethyl (B): <5 mg/dL (ref <5)

## 2016-07-25 MED ORDER — IBUPROFEN 200 MG PO TABS
600.0000 mg | ORAL_TABLET | Freq: Three times a day (TID) | ORAL | Status: DC | PRN
  Administered 2016-07-26: 600 mg via ORAL
  Filled 2016-07-25: qty 3

## 2016-07-25 MED ORDER — CHLORTHALIDONE 25 MG PO TABS
25.0000 mg | ORAL_TABLET | Freq: Every day | ORAL | Status: DC
  Administered 2016-07-26: 25 mg via ORAL
  Filled 2016-07-25: qty 1

## 2016-07-25 MED ORDER — RISPERIDONE 1 MG PO TABS
1.0000 mg | ORAL_TABLET | Freq: Every day | ORAL | Status: DC
  Administered 2016-07-25: 1 mg via ORAL
  Filled 2016-07-25: qty 1

## 2016-07-25 MED ORDER — PRIMIDONE 250 MG PO TABS
250.0000 mg | ORAL_TABLET | Freq: Three times a day (TID) | ORAL | Status: DC
  Administered 2016-07-25: 250 mg via ORAL
  Filled 2016-07-25 (×5): qty 1

## 2016-07-25 MED ORDER — CITALOPRAM HYDROBROMIDE 10 MG PO TABS
40.0000 mg | ORAL_TABLET | Freq: Every day | ORAL | Status: DC
  Administered 2016-07-26: 40 mg via ORAL
  Filled 2016-07-25: qty 4

## 2016-07-25 MED ORDER — ONDANSETRON HCL 4 MG PO TABS: 4.0000 mg | ORAL_TABLET | Freq: Three times a day (TID) | ORAL | Status: DC | PRN

## 2016-07-25 MED ORDER — GABAPENTIN 100 MG PO CAPS
200.0000 mg | ORAL_CAPSULE | Freq: Three times a day (TID) | ORAL | Status: DC
  Administered 2016-07-25 – 2016-07-26 (×3): 200 mg via ORAL
  Filled 2016-07-25 (×3): qty 2

## 2016-07-25 MED ORDER — BUPROPION HCL ER (XL) 150 MG PO TB24
300.0000 mg | ORAL_TABLET | Freq: Every day | ORAL | Status: DC
  Administered 2016-07-26: 300 mg via ORAL
  Filled 2016-07-25 (×3): qty 2

## 2016-07-25 MED ORDER — ZOLPIDEM TARTRATE 5 MG PO TABS
5.0000 mg | ORAL_TABLET | Freq: Every evening | ORAL | Status: DC | PRN
  Administered 2016-07-26: 5 mg via ORAL
  Filled 2016-07-25: qty 1

## 2016-07-25 MED ORDER — ALUM & MAG HYDROXIDE-SIMETH 200-200-20 MG/5ML PO SUSP: 30.0000 mL | ORAL | Status: DC | PRN

## 2016-07-25 MED ORDER — CLONAZEPAM 0.5 MG PO TABS: 0.2500 mg | ORAL_TABLET | Freq: Every day | ORAL | Status: DC

## 2016-07-25 NOTE — ED Provider Notes (Signed)
[place] DEPT Provider Note   CSN: [id number] Arrival date & time: 07/25/16  1154     History   Chief Complaint Chief Complaint  Patient presents with  . Suicidal    HPI Angel Maddox is a 61 y.o. female.  HPI   61 year old female with history of bipolar, anxiety, depression, MS presenting with suicidal ideation. Patient states her son died 2 years ago around this time and because of that she is feeling increasingly depressed with associated suicidal ideation including plan. States she wants to overdose on medication to end it all. She also report that her grandson who she lives doesn't want to spend time with her, she does not have any friends, she was seen at behavioral health recently for depression and states 3 of her medication was removed which worsening her depression. She is having crying spells. She denies homicidal ideation, denies auditory or visual hallucination. She is sleeping more, and eating less. She was brought here accompanied by her husband who is at bedside. She also has history of multiple sclerosis and has had several recurrent falls injuring her neck and her lower back. She is requesting x-rays for further evaluation.  Past Medical History:  Diagnosis Date  . Anxiety   . Bipolar 1 disorder ([place])   . Chronic kidney disease    kidney stone  . Depression   . H/O hiatal hernia   . Hypertension   . Multiple sclerosis ([place])     Had for 15 years  . Tremors of nervous system     Patient Active Problem List   Diagnosis Date Noted  . MDD (major depressive disorder), recurrent episode, severe ([place]) 03/29/2016  . Estrogen deficiency 12/15/2015  . MS (multiple sclerosis) ([place]) 12/15/2015  . Tremor 12/15/2015  . Healthcare maintenance 12/15/2015  . HTN (hypertension)   . Protein-calorie malnutrition, severe 11/09/2015  . Closed left hip fracture ([place]) 11/08/2015  . Bipolar 1 disorder, depressed ([place]) 02/11/2015    Past Surgical History:  Procedure  Laterality Date  . HIP PINNING,CANNULATED Left 11/09/2015   Procedure: CANNULATED HIP PINNING;  Surgeon: Rod Can, MD;  Location: [facility];  Service: Orthopedics;  Laterality: Left;  . LIPOMA EXCISION    . TUBAL LIGATION      OB History    No data available       Home Medications    Prior to Admission medications   Medication Sig Start Date End Date Taking? Authorizing Provider  buPROPion (WELLBUTRIN XL) 300 MG 24 hr tablet TAKE 1 TABLET EVERY DAY 06/08/16   Timmothy Euler, MD  chlorthalidone (HYGROTON) 25 MG tablet Take 1 tablet (25 mg total) by mouth daily. 06/15/16   Timmothy Euler, MD  gabapentin (NEURONTIN) 400 MG capsule Take 1 capsule (400 mg total) by mouth 3 (three) times daily. 04/01/16   Kerrie Buffalo, NP  hydrOXYzine (ATARAX/VISTARIL) 25 MG tablet Take 1 tablet (25 mg total) by mouth every 6 (six) hours as needed for anxiety. 04/01/16   Kerrie Buffalo, NP  primidone (MYSOLINE) 250 MG tablet Take 1 tablet (250 mg total) by mouth every 8 (eight) hours. 04/01/16   Kerrie Buffalo, NP  traZODone (DESYREL) 50 MG tablet Take 1 tablet (50 mg total) by mouth at bedtime and may repeat dose one time if needed. 04/01/16   Kerrie Buffalo, NP    Family History Family History  Problem Relation Age of Onset  . Heart attack Mother     Social History Social History  Substance Use  Topics  . Smoking status: Former Smoker    Types: Cigarettes    Quit date: 01/25/1991  . Smokeless tobacco: Never Used  . Alcohol use Yes     Comment: wine once a month     Allergies   Patient has no known allergies.   Review of Systems Review of Systems  All other systems reviewed and are negative.    Physical Exam Updated Vital Signs BP 131/93 (BP Location: Left Arm)   Pulse 67   Temp 98 F (36.7 C) (Oral)   Resp 16   SpO2 97%   Physical Exam  Constitutional: She appears well-developed and well-nourished.  Patient is tearful.  HENT:  Head: Atraumatic.  Eyes: Conjunctivae  are normal.  Neck: Neck supple.  Cardiovascular: Normal rate and regular rhythm.   Pulmonary/Chest: Effort normal and breath sounds normal.  Abdominal: Soft. She exhibits no distension. There is no tenderness.  Musculoskeletal: She exhibits tenderness (Mild diffuse tenderness along the midline spine without crepitus or step-off.).  Neurological: She is alert. GCS eye subscore is 4. GCS verbal subscore is 5. GCS motor subscore is 6.  Skin: No rash noted.  Psychiatric: Her speech is delayed. She is withdrawn. Thought content is not paranoid and not delusional. She exhibits a depressed mood. She expresses suicidal ideation. She expresses no homicidal ideation. She expresses suicidal plans.  Nursing note and vitals reviewed.    ED Treatments / Results  Labs (all labs ordered are listed, but only abnormal results are displayed) Labs Reviewed  COMPREHENSIVE METABOLIC PANEL - Abnormal; Notable for the following:       Result Value   Sodium 127 (*)    Chloride 87 (*)    All other components within normal limits  ACETAMINOPHEN LEVEL - Abnormal; Notable for the following:    Acetaminophen (Tylenol), Serum <10 (*)    All other components within normal limits  CBC - Abnormal; Notable for the following:    HCT 34.7 (*)    All other components within normal limits  RAPID URINE DRUG SCREEN, HOSP PERFORMED - Abnormal; Notable for the following:    Barbiturates POSITIVE (*)    All other components within normal limits  ETHANOL  SALICYLATE LEVEL    EKG  EKG Interpretation None       Radiology Dg Lumbar Spine Complete  Result Date: 07/25/2016 CLINICAL DATA:  Back pain EXAM: LUMBAR SPINE - COMPLETE 4+ VIEW COMPARISON:  04/23/2012 FINDINGS: Anatomic alignment. No vertebral compression. Severe narrowing of the L2-3, L3-4, and L4-5 discs is stable. No acute fracture. There is facet arthropathy at L4-5 and L5-S1. IMPRESSION: No acute bony pathology.  Chronic changes. Electronically Signed   By:  Marybelle Killings M.D.   On: 07/25/2016 14:01   Ct Cervical Spine Wo Contrast  Result Date: 07/25/2016 CLINICAL DATA:  Worsened acute on chronic neck pain. No known injury. EXAM: CT CERVICAL SPINE WITHOUT CONTRAST TECHNIQUE: Multidetector CT imaging of the cervical spine was performed without intravenous contrast. Multiplanar CT image reconstructions were also generated. COMPARISON:  None. FINDINGS: Alignment: Trace anterolisthesis C4 on C5 is due to left worse than right facet arthropathy. Skull base and vertebrae: No acute fracture. No primary bone lesion or focal pathologic process. Soft tissues and spinal canal: No prevertebral fluid or swelling. No visible canal hematoma. Disc levels: Loss of disc space height and endplate spurring are most notable at C5-6. No obvious central canal stenosis. Upper chest: Clear. Other: None. IMPRESSION: No acute or focal abnormality. Spondylosis most  notable at C4-5 and C5-6 as described above. Electronically Signed   By: Inge Rise M.D.   On: 07/25/2016 14:38    Procedures Procedures (including critical care time)  Medications Ordered in ED Medications  ibuprofen (ADVIL,MOTRIN) tablet 600 mg (not administered)  zolpidem (AMBIEN) tablet 5 mg (not administered)  ondansetron (ZOFRAN) tablet 4 mg (not administered)  alum & mag hydroxide-simeth (MAALOX/MYLANTA) 200-200-20 MG/5ML suspension 30 mL (not administered)  buPROPion (WELLBUTRIN XL) 24 hr tablet 300 mg (not administered)  chlorthalidone (HYGROTON) tablet 25 mg (not administered)  citalopram (CELEXA) tablet 40 mg (not administered)  clonazePAM (KLONOPIN) tablet 0.25 mg (not administered)  gabapentin (NEURONTIN) tablet 400 mg (not administered)  primidone (MYSOLINE) tablet 250 mg (not administered)  risperiDONE (RISPERDAL) tablet 1 mg (not administered)     Initial Impression / Assessment and Plan / ED Course  I have reviewed the triage vital signs and the nursing notes.  Pertinent labs & imaging  results that were available during my care of the patient were reviewed by me and considered in my medical decision making (see chart for details).     BP 131/93 (BP Location: Left Arm)   Pulse 67   Temp 98 F (36.7 C) (Oral)   Resp 16   SpO2 97%    Final Clinical Impressions(s) / ED Diagnoses   Final diagnoses:  Suicidal ideation    New Prescriptions New Prescriptions   No medications on file   1:04 PM Patient with history of depression here with suicidal ideation with plan to overdose of medication. Trigger factors including anniversary of her son's death, and lack of emotional support. Also report recurrent falls and complaining of pain to the base of the neck and her low back. X-ray ordered. We'll perform medical screening.  3:40 PM Evidence of hyponatremia with Na+ 127.  She does not appears confused or altered.  Positive barbiturates.  Imaging of cervical and thoracic without acute fracture.  Pt is medically cleared for psychiatric evaluation.    Domenic Moras, PA-C 07/25/16 [place], MD 07/26/16 [phone]

## 2016-07-25 NOTE — BH Assessment (Signed)
Under Review:  [place], [place]. [place], [place], [place], Teacher, music, [place], [place]

## 2016-07-25 NOTE — ED Notes (Signed)
Tele-psych & Waunita Schooner (TTS) at bedside.

## 2016-07-25 NOTE — ED Triage Notes (Signed)
Pt complaint of SI with plain to "take a bunch of pills." Pt reports anniversary of son's passing triggers feelings. Pt verbalizes worsening acute/chronic neck and back pain.
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
## 2016-07-26 DIAGNOSIS — Z9851 Tubal ligation status: Secondary | ICD-10-CM | POA: Diagnosis not present

## 2016-07-26 DIAGNOSIS — Z87891 Personal history of nicotine dependence: Secondary | ICD-10-CM | POA: Diagnosis not present

## 2016-07-26 DIAGNOSIS — Z9889 Other specified postprocedural states: Secondary | ICD-10-CM

## 2016-07-26 DIAGNOSIS — F319 Bipolar disorder, unspecified: Secondary | ICD-10-CM

## 2016-07-26 DIAGNOSIS — Z79899 Other long term (current) drug therapy: Secondary | ICD-10-CM

## 2016-07-26 DIAGNOSIS — Z8249 Family history of ischemic heart disease and other diseases of the circulatory system: Secondary | ICD-10-CM

## 2016-07-26 NOTE — ED Notes (Signed)
Patient reports she will go voluntarily to [facility].

## 2016-07-26 NOTE — ED Notes (Addendum)
Phone call from [facility] RN states that Angel Maddox has been accepted by Dr Raiford Simmonds  for admission and report can be called at [phone] to [phone].

## 2016-07-26 NOTE — ED Notes (Signed)
Attempted to call report to [facility].  Nurse in an emergency and will call back.

## 2016-07-26 NOTE — ED Notes (Signed)
Psychiatrist and nurse practitioner at bedside.

## 2016-07-26 NOTE — Progress Notes (Signed)
CSW contacted patient's husband Kahla Korando [phone]) to inquire about any safety concerns related to patient's request to be discharge. Patient's husband reports that he has no safety concerns with patient returning home noting that he feels the patient is ready to return home. CSW agreed to contact patient's husband when patient is ready for discharge to coordinate transportation.

## 2016-07-26 NOTE — BHH Suicide Risk Assessment (Signed)
Suicide Risk Assessment  Discharge Assessment   [facility] Discharge Suicide Risk Assessment   Principal Problem: Bipolar 1 disorder, depressed [facility]) Discharge Diagnoses:  Patient Active Problem List   Diagnosis Date Noted  . Bipolar 1 disorder, depressed ([place]) [F31.9] 02/11/2015    Priority: High  . MDD (major depressive disorder), recurrent episode, severe ([place]) [F33.2] 03/29/2016  . Estrogen deficiency [E28.39] 12/15/2015  . MS (multiple sclerosis) ([place]) [G35] 12/15/2015  . Tremor [R25.1] 12/15/2015  . Healthcare maintenance [Z00.00] 12/15/2015  . HTN (hypertension) [I10]   . Protein-calorie malnutrition, severe [E43] 11/09/2015  . Closed left hip fracture ([place]) [S72.002A] 11/08/2015    Total Time spent with patient: 45 minutes  Musculoskeletal: Strength & Muscle Tone: within normal limits Gait & Station: normal Patient leans: N/A  Psychiatric Specialty Exam: Physical Exam  Constitutional: She is oriented to person, place, and time. She appears well-developed and well-nourished.  HENT:  Head: Normocephalic.  Neck: Normal range of motion.  Respiratory: Effort normal.  Musculoskeletal: Normal range of motion.  Neurological: She is alert and oriented to person, place, and time.  Psychiatric: Her speech is normal and behavior is normal. Judgment and thought content normal. Her mood appears anxious. Cognition and memory are normal. She exhibits a depressed mood.    Review of Systems  Psychiatric/Behavioral: Positive for depression. The patient is nervous/anxious.   All other systems reviewed and are negative.   Blood pressure 131/92, pulse 67, temperature 98.1 F (36.7 C), temperature source Oral, resp. rate 17, SpO2 97 %.There is no height or weight on file to calculate BMI.  General Appearance: Casual  Eye Contact:  Good  Speech:  Normal Rate  Volume:  Normal  Mood:  Anxious and Depressed  Affect:  Congruent  Thought Process:  Coherent and Descriptions of Associations:  Intact  Orientation:  Full (Time, Place, and Person)  Thought Content:  WDL  Suicidal Thoughts:  No  Homicidal Thoughts:  No  Memory:  Immediate;   Good Recent;   Good Remote;   Good  Judgement:  Fair  Insight:  Fair  Psychomotor Activity:  Normal  Concentration:  Concentration: Good and Attention Span: Good  Recall:  Good  Fund of Knowledge:  Good  Language:  Good  Akathisia:  No  Handed:  Right  AIMS (if indicated):     Assets:  Housing Intimacy Leisure Time Physical Health Resilience Social Support  ADL's:  Intact  Cognition:  WNL  Sleep:      Mental Status Per Nursing Assessment::   On Admission:   suicidal ideations  Demographic Factors:  Caucasian  Loss Factors: NA  Historical Factors: Anniversary of important loss  Risk Reduction Factors:   Sense of responsibility to family, Living with another person, especially a relative, Positive social support and Positive therapeutic relationship  Continued Clinical Symptoms:  Depression and anxiety  Cognitive Features That Contribute To Risk:  None    Suicide Risk:  Minimal: No identifiable suicidal ideation.  Patients presenting with no risk factors but with morbid ruminations; may be classified as minimal risk based on the severity of the depressive symptoms    Plan Of Care/Follow-up recommendations:  Activity:  as tolerated Diet:  heart healthy diet  Danaija Eskridge, NP 07/26/2016, 1:02 PM

## 2016-07-26 NOTE — ED Notes (Signed)
Attempted to call report x 2.  No answer from nursing staff.  Will try again later.

## 2016-07-26 NOTE — Consult Note (Signed)
[facility] Consult   Reason for Consult:  Suicidal ideations Referring Physician:  EDP Patient Identification: Angel Maddox MRN:  [id number] Principal Diagnosis: Bipolar 1 disorder, depressed ([place]) Diagnosis:   Patient Active Problem List   Diagnosis Date Noted  . Bipolar 1 disorder, depressed ([place]) [F31.9] 02/11/2015    Priority: High  . MDD (major depressive disorder), recurrent episode, severe ([place]) [F33.2] 03/29/2016  . Estrogen deficiency [E28.39] 12/15/2015  . MS (multiple sclerosis) ([place]) [G35] 12/15/2015  . Tremor [R25.1] 12/15/2015  . Healthcare maintenance [Z00.00] 12/15/2015  . HTN (hypertension) [I10]   . Protein-calorie malnutrition, severe [E43] 11/09/2015  . Closed left hip fracture ([place]) [S72.002A] 11/08/2015    Total Time spent with patient: 45 minutes  Subjective:   Angel Maddox is a 61 y.o. female patient is stable to discharge.  HPI:  61 yo female who came to the ED with depression and suicidal ideations.  Today, she was offered a bed at [facility] but did not want to go.  She wanted to go home and continue her care with her outpatient therapist 3 days a week and psychiatrist.  States "I just talked crap out of my mouth yesterday."  She denies suicidal or homicidal ideations, hallucinations, or alcohol/drug abuse.  Her depression has increased with the holidays but has continued to focus on her son's children that she keeps a couple of days a week, 11 and 61 years old.  Her husband does not feel she is a safety risk ([name]).  Stable for discharge.  Past Psychiatric History: bipolar disorder  Risk to Self:None Risk to Others: Homicidal Ideation: No Thoughts of Harm to Others: No Current Homicidal Intent: No Current Homicidal Plan: No Access to Homicidal Means: No Identified Victim: none History of harm to others?: No Assessment of Violence: None Noted Violent Behavior Description: none Does patient have access to weapons?: No Criminal  Charges Pending?: No Does patient have a court date: No Prior Inpatient Therapy: Prior Inpatient Therapy: Yes Prior Therapy Dates: 03/2016 Prior Therapy Facilty/Provider(s): [facility] Reason for Treatment: SI thoughts, depression Prior Outpatient Therapy: Prior Outpatient Therapy: Yes Prior Therapy Dates: ongoing [facility] Reason for Treatment: depression, grief Does patient have an ACCT team?: No Does patient have Intensive In-House Services?  : No Does patient have [facility] services? : No Does patient have P4CC services?: No  Past Medical History:  Past Medical History:  Diagnosis Date  . Anxiety   . Bipolar 1 disorder ([place])   . Chronic kidney disease    kidney stone  . Depression   . H/O hiatal hernia   . Hypertension   . Multiple sclerosis ([place])     Had for 15 years  . Tremors of nervous system     Past Surgical History:  Procedure Laterality Date  . HIP PINNING,CANNULATED Left 11/09/2015   Procedure: CANNULATED HIP PINNING;  Surgeon: Rod Can, MD;  Location: [facility];  Service: Orthopedics;  Laterality: Left;  . LIPOMA EXCISION    . TUBAL LIGATION     Family History:  Family History  Problem Relation Age of Onset  . Heart attack Mother    Family Psychiatric  History: son with substance abuse Social History:  History  Alcohol Use  . Yes    Comment: wine once a month     History  Drug Use No    Social History   Social History  . Marital status: Married    Spouse name: N/A  . Number of  children: N/A  . Years of education: N/A   Social History Main Topics  . Smoking status: Former Smoker    Types: Cigarettes    Quit date: 01/25/1991  . Smokeless tobacco: Never Used  . Alcohol use Yes     Comment: wine once a month  . Drug use: No  . Sexual activity: Not Asked   Other Topics Concern  . None   Social History Narrative  . None   Additional Social History:    Allergies:  No Known Allergies  Labs:  Results for  orders placed or performed during the hospital encounter of 07/25/16 (from the past 48 hour(s))  Rapid urine drug screen (hospital performed)     Status: Abnormal   Collection Time: 07/25/16 12:17 PM  Result Value Ref Range   Opiates NONE DETECTED NONE DETECTED   Cocaine NONE DETECTED NONE DETECTED   Benzodiazepines NONE DETECTED NONE DETECTED   Amphetamines NONE DETECTED NONE DETECTED   Tetrahydrocannabinol NONE DETECTED NONE DETECTED   Barbiturates POSITIVE (A) NONE DETECTED    Comment:        DRUG SCREEN FOR MEDICAL PURPOSES ONLY.  IF CONFIRMATION IS NEEDED FOR ANY PURPOSE, NOTIFY LAB WITHIN 5 DAYS.        LOWEST DETECTABLE LIMITS FOR URINE DRUG SCREEN Drug Class       Cutoff (ng/mL) Amphetamine      1000 Barbiturate      200 Benzodiazepine   832 Tricyclics       919 Opiates          300 Cocaine          300 THC              50   Comprehensive metabolic panel     Status: Abnormal   Collection Time: 07/25/16  2:02 PM  Result Value Ref Range   Sodium 127 (L) 135 - 145 mmol/L   Potassium 3.9 3.5 - 5.1 mmol/L   Chloride 87 (L) 101 - 111 mmol/L   CO2 31 22 - 32 mmol/L   Glucose, Bld 87 65 - 99 mg/dL   BUN 12 6 - 20 mg/dL   Creatinine, Ser 0.79 0.44 - 1.00 mg/dL   Calcium 9.3 8.9 - 10.3 mg/dL   Total Protein 6.9 6.5 - 8.1 g/dL   Albumin 4.2 3.5 - 5.0 g/dL   AST 23 15 - 41 U/L   ALT 21 14 - 54 U/L   Alkaline Phosphatase 82 38 - 126 U/L   Total Bilirubin 0.5 0.3 - 1.2 mg/dL   GFR calc non Af Amer >60 >60 mL/min   GFR calc Af Amer >60 >60 mL/min    Comment: (NOTE) The eGFR has been calculated using the CKD EPI equation. This calculation has not been validated in all clinical situations. eGFR's persistently <60 mL/min signify possible Chronic Kidney Disease.    Anion gap 9 5 - 15  cbc     Status: Abnormal   Collection Time: 07/25/16  2:02 PM  Result Value Ref Range   WBC 4.2 4.0 - 10.5 K/uL   RBC 3.99 3.87 - 5.11 MIL/uL   Hemoglobin 12.2 12.0 - 15.0 g/dL   HCT 34.7  (L) 36.0 - 46.0 %   MCV 87.0 78.0 - 100.0 fL   MCH 30.6 26.0 - 34.0 pg   MCHC 35.2 30.0 - 36.0 g/dL   RDW 12.2 11.5 - 15.5 %   Platelets 232 150 - 400 K/uL  Ethanol  Status: None   Collection Time: 07/25/16  2:04 PM  Result Value Ref Range   Alcohol, Ethyl (B) <5 <5 mg/dL    Comment:        LOWEST DETECTABLE LIMIT FOR SERUM ALCOHOL IS 5 mg/dL FOR MEDICAL PURPOSES ONLY   Salicylate level     Status: None   Collection Time: 07/25/16  2:04 PM  Result Value Ref Range   Salicylate Lvl <3.5 2.8 - 30.0 mg/dL  Acetaminophen level     Status: Abnormal   Collection Time: 07/25/16  2:04 PM  Result Value Ref Range   Acetaminophen (Tylenol), Serum <10 (L) 10 - 30 ug/mL    Comment:        THERAPEUTIC CONCENTRATIONS VARY SIGNIFICANTLY. A RANGE OF 10-30 ug/mL MAY BE AN EFFECTIVE CONCENTRATION FOR MANY PATIENTS. HOWEVER, SOME ARE BEST TREATED AT CONCENTRATIONS OUTSIDE THIS RANGE. ACETAMINOPHEN CONCENTRATIONS >150 ug/mL AT 4 HOURS AFTER INGESTION AND >50 ug/mL AT 12 HOURS AFTER INGESTION ARE OFTEN ASSOCIATED WITH TOXIC REACTIONS.     Current Facility-Administered Medications  Medication Dose Route Frequency Provider Last Rate Last Dose  . alum & mag hydroxide-simeth (MAALOX/MYLANTA) 200-200-20 MG/5ML suspension 30 mL  30 mL Oral PRN Domenic Moras, PA-C      . buPROPion (WELLBUTRIN XL) 24 hr tablet 300 mg  300 mg Oral Daily Domenic Moras, PA-C   300 mg at 07/26/16 1018  . chlorthalidone (HYGROTON) tablet 25 mg  25 mg Oral Daily Domenic Moras, PA-C   25 mg at 07/26/16 1018  . citalopram (CELEXA) tablet 40 mg  40 mg Oral Q breakfast Domenic Moras, PA-C   40 mg at 07/26/16 0093  . gabapentin (NEURONTIN) capsule 200 mg  200 mg Oral TID Domenic Moras, PA-C   200 mg at 07/26/16 1018  . ibuprofen (ADVIL,MOTRIN) tablet 600 mg  600 mg Oral Q8H PRN Domenic Moras, PA-C   600 mg at 07/26/16 1134  . ondansetron (ZOFRAN) tablet 4 mg  4 mg Oral Q8H PRN Domenic Moras, PA-C      . primidone (MYSOLINE) tablet 250 mg  250  mg Oral Q8H Domenic Moras, PA-C   250 mg at 07/25/16 1811  . risperiDONE (RISPERDAL) tablet 1 mg  1 mg Oral QHS Domenic Moras, PA-C   1 mg at 07/25/16 2240  . zolpidem (AMBIEN) tablet 5 mg  5 mg Oral QHS PRN Domenic Moras, PA-C   5 mg at 07/26/16 0028   Current Outpatient Prescriptions  Medication Sig Dispense Refill  . buPROPion (WELLBUTRIN XL) 300 MG 24 hr tablet TAKE 1 TABLET EVERY DAY 30 tablet 0  . chlorthalidone (HYGROTON) 25 MG tablet Take 1 tablet (25 mg total) by mouth daily. 90 tablet 0  . citalopram (CELEXA) 40 MG tablet Take 40 mg by mouth daily with breakfast.    . clonazePAM (KLONOPIN) 0.5 MG tablet Take 0.25 mg by mouth at bedtime.    . gabapentin (NEURONTIN) 800 MG tablet Take 200 mg by mouth 3 (three) times daily.   5  . hydrOXYzine (ATARAX/VISTARIL) 25 MG tablet Take 1 tablet (25 mg total) by mouth every 6 (six) hours as needed for anxiety. (Patient taking differently: Take 25-50 mg by mouth every 6 (six) hours as needed for anxiety. ) 30 tablet 0  . primidone (MYSOLINE) 250 MG tablet Take 1 tablet (250 mg total) by mouth every 8 (eight) hours. 90 tablet 0  . risperiDONE (RISPERDAL) 1 MG tablet Take 1 mg by mouth at bedtime.     Marland Kitchen  traZODone (DESYREL) 50 MG tablet Take 1 tablet (50 mg total) by mouth at bedtime and may repeat dose one time if needed. 30 tablet 0  . gabapentin (NEURONTIN) 400 MG capsule Take 1 capsule (400 mg total) by mouth 3 (three) times daily. (Patient not taking: Reported on 07/25/2016) 90 capsule 0    Musculoskeletal: Strength & Muscle Tone: within normal limits Gait & Station: normal Patient leans: N/A  Psychiatric Specialty Exam: Physical Exam  Constitutional: She is oriented to person, place, and time. She appears well-developed and well-nourished.  HENT:  Head: Normocephalic.  Neck: Normal range of motion.  Respiratory: Effort normal.  Musculoskeletal: Normal range of motion.  Neurological: She is alert and oriented to person, place, and time.   Psychiatric: Her speech is normal and behavior is normal. Judgment and thought content normal. Her mood appears anxious. Cognition and memory are normal. She exhibits a depressed mood.    Review of Systems  Psychiatric/Behavioral: Positive for depression. The patient is nervous/anxious.   All other systems reviewed and are negative.   Blood pressure 131/92, pulse 67, temperature 98.1 F (36.7 C), temperature source Oral, resp. rate 17, SpO2 97 %.There is no height or weight on file to calculate BMI.  General Appearance: Casual  Eye Contact:  Good  Speech:  Normal Rate  Volume:  Normal  Mood:  Anxious and Depressed  Affect:  Congruent  Thought Process:  Coherent and Descriptions of Associations: Intact  Orientation:  Full (Time, Place, and Person)  Thought Content:  WDL  Suicidal Thoughts:  No  Homicidal Thoughts:  No  Memory:  Immediate;   Good Recent;   Good Remote;   Good  Judgement:  Fair  Insight:  Fair  Psychomotor Activity:  Normal  Concentration:  Concentration: Good and Attention Span: Good  Recall:  Good  Fund of Knowledge:  Good  Language:  Good  Akathisia:  No  Handed:  Right  AIMS (if indicated):     Assets:  Housing Intimacy Leisure Time Physical Health Resilience Social Support  ADL's:  Intact  Cognition:  WNL  Sleep:        Treatment Plan Summary: Daily contact with patient to assess and evaluate symptoms and progress in treatment, Medication management and Plan bipolar affective disorder, depressed, mild:  -Crisis stabilization -Medication management:  Continued Celexa 40 mg daily for depression, gabapentin 200 mg TID for mood and pain, Risperdal 1 mg at bedtime for mood, and Wellbutrin 300 mg daily for depression along with her medical medications -Individual counseling  Disposition: No evidence of imminent risk to self or others at present.    Waylan Boga, NP 07/26/2016 12:57 PM  Patient seen face-to-face for psychiatric evaluation, chart  reviewed and case discussed with the physician extender and developed treatment plan. Reviewed the information documented and agree with the treatment plan. Corena Pilgrim, MD

## 2016-07-26 NOTE — ED Notes (Signed)
Patient's husband coming to pick her up.

## 2016-07-26 NOTE — ED Notes (Signed)
Patient upset about having to go to [facility] for psychiatric care.  Patient tearful and talking with her husband on the phone.  Nurse talked with patient and explained that patient was being sent for suicidal ideation, but was voluntary, and if she refused treatment the psychiatrist would have to reassess her this morning to see if she should be involuntarily committed for treatment.

## 2016-07-27 DIAGNOSIS — M5137 Other intervertebral disc degeneration, lumbosacral region: Secondary | ICD-10-CM | POA: Diagnosis not present

## 2016-07-27 DIAGNOSIS — M9903 Segmental and somatic dysfunction of lumbar region: Secondary | ICD-10-CM | POA: Diagnosis not present

## 2016-07-27 DIAGNOSIS — M9901 Segmental and somatic dysfunction of cervical region: Secondary | ICD-10-CM | POA: Diagnosis not present

## 2016-07-27 DIAGNOSIS — M9902 Segmental and somatic dysfunction of thoracic region: Secondary | ICD-10-CM | POA: Diagnosis not present

## 2016-07-28 DIAGNOSIS — M9903 Segmental and somatic dysfunction of lumbar region: Secondary | ICD-10-CM | POA: Diagnosis not present

## 2016-07-28 DIAGNOSIS — M9902 Segmental and somatic dysfunction of thoracic region: Secondary | ICD-10-CM | POA: Diagnosis not present

## 2016-07-28 DIAGNOSIS — M5137 Other intervertebral disc degeneration, lumbosacral region: Secondary | ICD-10-CM | POA: Diagnosis not present

## 2016-07-28 DIAGNOSIS — M9901 Segmental and somatic dysfunction of cervical region: Secondary | ICD-10-CM | POA: Diagnosis not present

## 2016-08-01 DIAGNOSIS — M5137 Other intervertebral disc degeneration, lumbosacral region: Secondary | ICD-10-CM | POA: Diagnosis not present

## 2016-08-01 DIAGNOSIS — M9903 Segmental and somatic dysfunction of lumbar region: Secondary | ICD-10-CM | POA: Diagnosis not present

## 2016-08-01 DIAGNOSIS — M9901 Segmental and somatic dysfunction of cervical region: Secondary | ICD-10-CM | POA: Diagnosis not present

## 2016-08-01 DIAGNOSIS — M9902 Segmental and somatic dysfunction of thoracic region: Secondary | ICD-10-CM | POA: Diagnosis not present

## 2016-08-02 DIAGNOSIS — M9903 Segmental and somatic dysfunction of lumbar region: Secondary | ICD-10-CM | POA: Diagnosis not present

## 2016-08-02 DIAGNOSIS — M5137 Other intervertebral disc degeneration, lumbosacral region: Secondary | ICD-10-CM | POA: Diagnosis not present

## 2016-08-02 DIAGNOSIS — M9901 Segmental and somatic dysfunction of cervical region: Secondary | ICD-10-CM | POA: Diagnosis not present

## 2016-08-02 DIAGNOSIS — M9902 Segmental and somatic dysfunction of thoracic region: Secondary | ICD-10-CM | POA: Diagnosis not present

## 2016-08-03 DIAGNOSIS — M9902 Segmental and somatic dysfunction of thoracic region: Secondary | ICD-10-CM | POA: Diagnosis not present

## 2016-08-03 DIAGNOSIS — M9904 Segmental and somatic dysfunction of sacral region: Secondary | ICD-10-CM | POA: Diagnosis not present

## 2016-08-03 DIAGNOSIS — M9905 Segmental and somatic dysfunction of pelvic region: Secondary | ICD-10-CM | POA: Diagnosis not present

## 2016-08-03 DIAGNOSIS — M9901 Segmental and somatic dysfunction of cervical region: Secondary | ICD-10-CM | POA: Diagnosis not present

## 2016-08-03 DIAGNOSIS — M5030 Other cervical disc degeneration, unspecified cervical region: Secondary | ICD-10-CM | POA: Diagnosis not present

## 2016-08-03 DIAGNOSIS — M9903 Segmental and somatic dysfunction of lumbar region: Secondary | ICD-10-CM | POA: Diagnosis not present

## 2016-08-04 DIAGNOSIS — M9905 Segmental and somatic dysfunction of pelvic region: Secondary | ICD-10-CM | POA: Diagnosis not present

## 2016-08-04 DIAGNOSIS — M9902 Segmental and somatic dysfunction of thoracic region: Secondary | ICD-10-CM | POA: Diagnosis not present

## 2016-08-04 DIAGNOSIS — M5030 Other cervical disc degeneration, unspecified cervical region: Secondary | ICD-10-CM | POA: Diagnosis not present

## 2016-08-04 DIAGNOSIS — M9903 Segmental and somatic dysfunction of lumbar region: Secondary | ICD-10-CM | POA: Diagnosis not present

## 2016-08-04 DIAGNOSIS — M9904 Segmental and somatic dysfunction of sacral region: Secondary | ICD-10-CM | POA: Diagnosis not present

## 2016-08-04 DIAGNOSIS — M9901 Segmental and somatic dysfunction of cervical region: Secondary | ICD-10-CM | POA: Diagnosis not present

## 2016-08-08 DIAGNOSIS — M9902 Segmental and somatic dysfunction of thoracic region: Secondary | ICD-10-CM | POA: Diagnosis not present

## 2016-08-08 DIAGNOSIS — M9901 Segmental and somatic dysfunction of cervical region: Secondary | ICD-10-CM | POA: Diagnosis not present

## 2016-08-08 DIAGNOSIS — M9905 Segmental and somatic dysfunction of pelvic region: Secondary | ICD-10-CM | POA: Diagnosis not present

## 2016-08-08 DIAGNOSIS — M9904 Segmental and somatic dysfunction of sacral region: Secondary | ICD-10-CM | POA: Diagnosis not present

## 2016-08-08 DIAGNOSIS — M9903 Segmental and somatic dysfunction of lumbar region: Secondary | ICD-10-CM | POA: Diagnosis not present

## 2016-08-08 DIAGNOSIS — F411 Generalized anxiety disorder: Secondary | ICD-10-CM | POA: Diagnosis not present

## 2016-08-08 DIAGNOSIS — M5030 Other cervical disc degeneration, unspecified cervical region: Secondary | ICD-10-CM | POA: Diagnosis not present

## 2016-08-09 DIAGNOSIS — M5030 Other cervical disc degeneration, unspecified cervical region: Secondary | ICD-10-CM | POA: Diagnosis not present

## 2016-08-09 DIAGNOSIS — M9903 Segmental and somatic dysfunction of lumbar region: Secondary | ICD-10-CM | POA: Diagnosis not present

## 2016-08-09 DIAGNOSIS — M9902 Segmental and somatic dysfunction of thoracic region: Secondary | ICD-10-CM | POA: Diagnosis not present

## 2016-08-09 DIAGNOSIS — M9901 Segmental and somatic dysfunction of cervical region: Secondary | ICD-10-CM | POA: Diagnosis not present

## 2016-08-09 DIAGNOSIS — M9905 Segmental and somatic dysfunction of pelvic region: Secondary | ICD-10-CM | POA: Diagnosis not present

## 2016-08-09 DIAGNOSIS — M9904 Segmental and somatic dysfunction of sacral region: Secondary | ICD-10-CM | POA: Diagnosis not present

## 2016-08-10 ENCOUNTER — Other Ambulatory Visit: Payer: Self-pay | Admitting: Family Medicine

## 2016-08-10 DIAGNOSIS — M9905 Segmental and somatic dysfunction of pelvic region: Secondary | ICD-10-CM | POA: Diagnosis not present

## 2016-08-10 DIAGNOSIS — M9904 Segmental and somatic dysfunction of sacral region: Secondary | ICD-10-CM | POA: Diagnosis not present

## 2016-08-10 DIAGNOSIS — M9901 Segmental and somatic dysfunction of cervical region: Secondary | ICD-10-CM | POA: Diagnosis not present

## 2016-08-10 DIAGNOSIS — M9902 Segmental and somatic dysfunction of thoracic region: Secondary | ICD-10-CM | POA: Diagnosis not present

## 2016-08-10 DIAGNOSIS — M5030 Other cervical disc degeneration, unspecified cervical region: Secondary | ICD-10-CM | POA: Diagnosis not present

## 2016-08-10 DIAGNOSIS — M9903 Segmental and somatic dysfunction of lumbar region: Secondary | ICD-10-CM | POA: Diagnosis not present

## 2016-08-11 DIAGNOSIS — M9903 Segmental and somatic dysfunction of lumbar region: Secondary | ICD-10-CM | POA: Diagnosis not present

## 2016-08-11 DIAGNOSIS — M9901 Segmental and somatic dysfunction of cervical region: Secondary | ICD-10-CM | POA: Diagnosis not present

## 2016-08-11 DIAGNOSIS — M9905 Segmental and somatic dysfunction of pelvic region: Secondary | ICD-10-CM | POA: Diagnosis not present

## 2016-08-11 DIAGNOSIS — M5030 Other cervical disc degeneration, unspecified cervical region: Secondary | ICD-10-CM | POA: Diagnosis not present

## 2016-08-11 DIAGNOSIS — M9902 Segmental and somatic dysfunction of thoracic region: Secondary | ICD-10-CM | POA: Diagnosis not present

## 2016-08-11 DIAGNOSIS — M9904 Segmental and somatic dysfunction of sacral region: Secondary | ICD-10-CM | POA: Diagnosis not present

## 2016-08-15 DIAGNOSIS — G894 Chronic pain syndrome: Secondary | ICD-10-CM | POA: Diagnosis not present

## 2016-08-15 DIAGNOSIS — M545 Low back pain: Secondary | ICD-10-CM | POA: Diagnosis not present

## 2016-08-15 DIAGNOSIS — M542 Cervicalgia: Secondary | ICD-10-CM | POA: Diagnosis not present

## 2016-08-15 DIAGNOSIS — M546 Pain in thoracic spine: Secondary | ICD-10-CM | POA: Diagnosis not present

## 2016-08-16 DIAGNOSIS — M9901 Segmental and somatic dysfunction of cervical region: Secondary | ICD-10-CM | POA: Diagnosis not present

## 2016-08-16 DIAGNOSIS — M9902 Segmental and somatic dysfunction of thoracic region: Secondary | ICD-10-CM | POA: Diagnosis not present

## 2016-08-16 DIAGNOSIS — M5030 Other cervical disc degeneration, unspecified cervical region: Secondary | ICD-10-CM | POA: Diagnosis not present

## 2016-08-16 DIAGNOSIS — M9904 Segmental and somatic dysfunction of sacral region: Secondary | ICD-10-CM | POA: Diagnosis not present

## 2016-08-16 DIAGNOSIS — M9905 Segmental and somatic dysfunction of pelvic region: Secondary | ICD-10-CM | POA: Diagnosis not present

## 2016-08-16 DIAGNOSIS — M9903 Segmental and somatic dysfunction of lumbar region: Secondary | ICD-10-CM | POA: Diagnosis not present

## 2016-08-17 ENCOUNTER — Ambulatory Visit (INDEPENDENT_AMBULATORY_CARE_PROVIDER_SITE_OTHER): Payer: Medicare Other

## 2016-08-17 DIAGNOSIS — M5030 Other cervical disc degeneration, unspecified cervical region: Secondary | ICD-10-CM | POA: Diagnosis not present

## 2016-08-17 DIAGNOSIS — Z780 Asymptomatic menopausal state: Secondary | ICD-10-CM | POA: Diagnosis not present

## 2016-08-17 DIAGNOSIS — M9904 Segmental and somatic dysfunction of sacral region: Secondary | ICD-10-CM | POA: Diagnosis not present

## 2016-08-17 DIAGNOSIS — M9905 Segmental and somatic dysfunction of pelvic region: Secondary | ICD-10-CM | POA: Diagnosis not present

## 2016-08-17 DIAGNOSIS — E2839 Other primary ovarian failure: Secondary | ICD-10-CM

## 2016-08-17 DIAGNOSIS — M9903 Segmental and somatic dysfunction of lumbar region: Secondary | ICD-10-CM | POA: Diagnosis not present

## 2016-08-17 DIAGNOSIS — M9901 Segmental and somatic dysfunction of cervical region: Secondary | ICD-10-CM | POA: Diagnosis not present

## 2016-08-17 DIAGNOSIS — M9902 Segmental and somatic dysfunction of thoracic region: Secondary | ICD-10-CM | POA: Diagnosis not present

## 2016-08-18 ENCOUNTER — Encounter: Payer: Self-pay | Admitting: Family Medicine

## 2016-08-18 ENCOUNTER — Ambulatory Visit (INDEPENDENT_AMBULATORY_CARE_PROVIDER_SITE_OTHER): Payer: Medicare Other | Admitting: Family Medicine

## 2016-08-18 VITALS — BP 115/87 | HR 67 | Temp 98.9°F | Ht 68.0 in | Wt 129.6 lb

## 2016-08-18 DIAGNOSIS — M5441 Lumbago with sciatica, right side: Secondary | ICD-10-CM | POA: Diagnosis not present

## 2016-08-18 DIAGNOSIS — M9905 Segmental and somatic dysfunction of pelvic region: Secondary | ICD-10-CM | POA: Diagnosis not present

## 2016-08-18 DIAGNOSIS — M9902 Segmental and somatic dysfunction of thoracic region: Secondary | ICD-10-CM | POA: Diagnosis not present

## 2016-08-18 DIAGNOSIS — M5030 Other cervical disc degeneration, unspecified cervical region: Secondary | ICD-10-CM | POA: Diagnosis not present

## 2016-08-18 DIAGNOSIS — M542 Cervicalgia: Secondary | ICD-10-CM | POA: Diagnosis not present

## 2016-08-18 DIAGNOSIS — M9903 Segmental and somatic dysfunction of lumbar region: Secondary | ICD-10-CM | POA: Diagnosis not present

## 2016-08-18 DIAGNOSIS — M9901 Segmental and somatic dysfunction of cervical region: Secondary | ICD-10-CM | POA: Diagnosis not present

## 2016-08-18 DIAGNOSIS — M9904 Segmental and somatic dysfunction of sacral region: Secondary | ICD-10-CM | POA: Diagnosis not present

## 2016-08-18 MED ORDER — MELOXICAM 15 MG PO TABS: 15.0000 mg | ORAL_TABLET | Freq: Every day | ORAL | 0 refills | Status: DC

## 2016-08-18 NOTE — Patient Instructions (Signed)
Great to see you!  You may take meloxicam 1 pill once daily, and tylenol ( or midol as long as it does not contain naproxen, aspirin, or ibuprofen).   Appropriate tylenol dosing is no more than 2 pills 3 times daily.

## 2016-08-18 NOTE — Progress Notes (Signed)
   HPI  Patient presents today here with back pain.  Patient explains that she's had back pain for years, she states that she's been told by a chiropractor that she has degenerative disc disease from the cervical spine to the end of her lumbar spine.  She's had pain for years that has usually responded very well to chiropractic care., Over the last 7 days her pain is been much worse. She had back pain going to the emergency room last month and had a suicidal plan at that time, she was discharged. She denies suicidal thoughts today continues to have severe depression.  Patient describes severe left-sided paraspinal neck pain as well as severe bilateral lumbar spine back pain, worse with walking, worse with standing up, often making it difficult to go grocery shopping, sit for long periods of time, or stand for long periods of time.  She was seen by orthopedic surgery on Monday who recommended his therapy, she's planning to do this. She gets moderate relief from 600 mg of ibuprofen plus Midol  Patient was previously having right sided sciatica symptoms, however these improved a month or 2 ago after intensive chiropractic care.  PMH: Smoking status noted ROS: Per HPI  Objective: BP 115/87   Pulse 67   Temp 98.9 F (37.2 C) (Oral)   Ht 5\' 8"  (1.727 m)   Wt 129 lb 9.6 oz (58.8 kg)   BMI 19.71 kg/m  Gen: NAD, alert, cooperative with exam HEENT: NCAT CV: RRR, good S1/S2, no murmur Resp: CTABL, no wheezes, non-labored Ext: No edema, warm Neuro: Alert and oriented, 1+ symmetric patellar tendon reflexes bilaterally, strength 5/5 and sensation intact in bilateral lower [name] is MSK No tenderness to palpation of the paraspinal muscles or midline spine of the cervical, thoracic, or lumbar area. Patient stands very carefully using her cane holding her right SI/paraspinal muscle area.  Assessment and plan:  # Chronic neck pain, acute on chronic bilateral low back pain with right-sided  statica Sciatica symptoms have resolved Patient given minimal relief with ibuprofen plus Midol Given meloxicam 15 mg daily 2 weeks, discussed appropriate Tylenol dosing, recommended milligrams 3 times daily in addition to the meloxicam I agree with orthopedic surgery, she should go through physical therapy. She's planning to call orthopedics in 2 weeks to see if she's had improvement, if she has not they plan on pursuing MRI. Plain film from recent ER visit reviewed, she has disc space narrowing throughout her lumbar spine  She denies suicidal thoughts today.  Follow-up 2 weeks Discussed very carefully avoiding any other NSAIDs, okay to take meloxicam plus Midol if it does not contain any NSAIDs ( different formulations vary)   Meds ordered this encounter  Medications  . meloxicam (MOBIC) 15 MG tablet    Sig: Take 1 tablet (15 mg total) by mouth daily.    Dispense:  14 tablet    Refill:  0    Laroy Apple, MD [facility] 08/18/2016, 12:00 PM

## 2016-08-22 ENCOUNTER — Ambulatory Visit: Payer: Medicare Other | Attending: Physical Medicine and Rehabilitation | Admitting: Physical Therapy

## 2016-08-22 DIAGNOSIS — M545 Low back pain: Secondary | ICD-10-CM | POA: Diagnosis not present

## 2016-08-22 DIAGNOSIS — G8929 Other chronic pain: Secondary | ICD-10-CM

## 2016-08-22 DIAGNOSIS — G543 Thoracic root disorders, not elsewhere classified: Secondary | ICD-10-CM

## 2016-08-22 DIAGNOSIS — R2689 Other abnormalities of gait and mobility: Secondary | ICD-10-CM | POA: Insufficient documentation

## 2016-08-22 DIAGNOSIS — M6281 Muscle weakness (generalized): Secondary | ICD-10-CM | POA: Insufficient documentation

## 2016-08-22 DIAGNOSIS — R293 Abnormal posture: Secondary | ICD-10-CM

## 2016-08-22 DIAGNOSIS — M542 Cervicalgia: Secondary | ICD-10-CM | POA: Diagnosis not present

## 2016-08-22 NOTE — Therapy (Signed)
[facility] [place], [place], [address] Phone: [phone]   Fax:  [phone]  Physical Therapy Evaluation  Patient Details  Name: Angel Maddox MRN: [id number] Date of Birth: [birth year] Referring Provider: Rod Can MD  Encounter Date: 08/22/2016      PT End of Session - 08/22/16 1141    Visit Number 1   Number of Visits 16   Date for PT Re-Evaluation 10/21/16   PT Start Time 0956   PT Stop Time 1044   PT Time Calculation (min) 48 min   Activity Tolerance Patient tolerated treatment well   Behavior During Therapy [facility] for tasks assessed/performed      Past Medical History:  Diagnosis Date  . Anxiety   . Bipolar 1 disorder ([place])   . Chronic kidney disease    kidney stone  . Depression   . H/O hiatal hernia   . Hypertension   . Multiple sclerosis ([place])     Had for 15 years  . Tremors of nervous system     Past Surgical History:  Procedure Laterality Date  . HIP PINNING,CANNULATED Left 11/09/2015   Procedure: CANNULATED HIP PINNING;  Surgeon: Rod Can, MD;  Location: [facility];  Service: Orthopedics;  Laterality: Left;  . LIPOMA EXCISION    . TUBAL LIGATION      There were no vitals filed for this visit.       Subjective Assessment - 08/22/16 1031    Subjective The patient prsents to [facility]PPT with c/o spinal pain from her neck to her low back.  She has headaches daily.  She reports her pain at a 10/10.  She has had problems since 2002 but reports it got bad again after a fall and a left hip fracture resulting in surgery in May of 2017.  She states that sitting makes her hurt a lot and lying in bed decreases her pain.   Limitations Sitting   How long can you sit comfortably? 5-10 minutes.   Patient Stated Goals Get out of pain.   Currently in Pain? Yes   Pain Score 10-Worst pain ever   Pain Location Back   Pain Orientation Right;Left;Lower   Pain Descriptors / Indicators Aching   Pain Type  Chronic pain   Pain Onset More than a month ago   Pain Frequency Constant   Aggravating Factors  See above.   Pain Relieving Factors See above.   Multiple Pain Sites Yes   Pain Score 9   Pain Location Neck   Pain Orientation Left   Pain Descriptors / Indicators Aching;Headache   Pain Type Chronic pain   Pain Onset More than a month ago   Pain Frequency Constant   Aggravating Factors  See above.   Pain Relieving Factors See above.            [facility] PT Assessment - 08/22/16 0001      Assessment   Medical Diagnosis Neck, thoracic and low back pain.   Referring Provider --  Suella Broad MD.     Precautions   Precautions Fall  Use of straight cane for safe ambulation.     Restrictions   Weight Bearing Restrictions No     Balance Screen   Has the patient fallen in the past 6 months Yes   How many times? --  2.   Has the patient had a decrease in activity level because of a fear of falling?  Yes   Is the patient reluctant  to leave their home because of a fear of falling?  Yes     [place] residence     Prior Function   Level of Independence Independent     Observation/Other Assessments   Focus on Therapeutic Outcomes (FOTO)  --  71% limitation.     Posture/Postural Control   Posture/Postural Control Postural limitations   Postural Limitations Rounded Shoulders;Forward head;Decreased lumbar lordosis     ROM / Strength   AROM / PROM / Strength AROM;Strength     AROM   Overall AROM Comments Bilateral active cervical rotation= 50 degrees.  Active lumbar flexion limited by 70% and lumbar extension= 12 degrees.     Strength   Overall Strength Comments Left hip abduction= 4-/5.  Other major muscle groups of bilateral U and LE's were globally weak graded grossly at 4/5 with a "shaky" muscle response vwhen manualmuscle testing was performed.     Palpation   Palpation comment Tender over left UT.  She c/o diffuse paraspinal  muscular pain over her entire thoracic and lumbar region.       Special Tests    Special Tests --  Norm UE DTR's.  LE DTR's were absent.     Transfers   Transfers --  Log roll from supine to sit.     Ambulation/Gait   Gait Pattern Step-to pattern;Decreased step length - left;Decreased stride length;Antalgic   Gait Comments Trendelenburg gait pattern.                   [facility] Adult PT Treatment/Exercise - 08/22/16 0001      Modalities   Modalities Electrical Stimulation;Moist Heat     Moist Heat Therapy   Number Minutes Moist Heat 15 Minutes   Moist Heat Location --  LT UT/bil LB.     Acupuncturist Location --  LT UT/Bil LB.   Electrical Stimulation Action Pre-mod (5 sec on and 5 sec off to neck and constant to low back).   Electrical Stimulation Goals Pain                  PT Short Term Goals - 08/22/16 1112      PT SHORT TERM GOAL #1   Title Independent with an initial HEP.   Time 2   Period Weeks   Status New     PT SHORT TERM GOAL #2   Title Active bilateral cervical rotation= 60 degrees.   Time 2   Period Weeks   Status New           PT Long Term Goals - 08/22/16 1113      PT LONG TERM GOAL #1   Title Independent with an advanced HEP.   Time 8   Period Weeks   Status New     PT LONG TERM GOAL #2   Title Increase left hip abduction strength to a solid 4+/5 in order to eliminate her Trendelenburg gait pattern.   Time 8   Period Weeks   Status New     PT LONG TERM GOAL #3   Title Sit 20 minutes with pain not > 3/10.   Time 8   Period Weeks   Status New     PT LONG TERM GOAL #4   Title Eliminate left LE pain.     Time 8   Period Weeks   Status New     PT LONG TERM GOAL #5   Title  Perform ADL's with pain not > 3/10.               Plan - 08/22/16 1056    Clinical Impression Statement The patient has diffuse spinal musculature pain from her cervical to lumbar region.  She has  headaches and radicular pain going down the length of her left LE to her foot.  She has limited spinal range of motion and is globally weak throughout her U and LE's.  She will benefit from skilled PT to include modalities, STW/M and dry needling and progression into ROM and core exercises.   Rehab Potential Good   PT Frequency 2x / week   PT Duration 8 weeks   PT Treatment/Interventions ADLs/Self Care Home Management;Cryotherapy;Electrical Stimulation;Moist Heat;Ultrasound;Therapeutic activities;Therapeutic exercise;Patient/family education;Manual techniques;Passive range of motion;Dry needling   PT Next Visit Plan Modalities; STW/M; Dry needling; active cervical and lumbar range of motion and progression into core exercises.      Patient will benefit from skilled therapeutic intervention in order to improve the following deficits and impairments:  Pain, Decreased activity tolerance, Decreased range of motion, Decreased strength  Visit Diagnosis: Cervicalgia - Plan: PT plan of care cert/re-cert  Thoracic root disorders, not elsewhere classified - Plan: PT plan of care cert/re-cert  Abnormal posture - Plan: PT plan of care cert/re-cert  Chronic bilateral low back pain, with sciatica presence unspecified - Plan: PT plan of care cert/re-cert      G-Codes - [id number]    Functional Assessment Tool Used (Outpatient Only) 71% limitation.   Functional Limitation Mobility: Walking and moving around   Mobility: Walking and Moving Around Current Status [phone]) At least 60 percent but less than 80 percent impaired, limited or restricted   Mobility: Walking and Moving Around Goal Status [phone]) At least 20 percent but less than 40 percent impaired, limited or restricted       Problem List Patient Active Problem List   Diagnosis Date Noted  . MDD (major depressive disorder), recurrent episode, severe ([place]) 03/29/2016  . Estrogen deficiency 12/15/2015  . MS (multiple sclerosis) ([place])  12/15/2015  . Tremor 12/15/2015  . Healthcare maintenance 12/15/2015  . HTN (hypertension)   . Protein-calorie malnutrition, severe 11/09/2015  . Closed left hip fracture ([place]) 11/08/2015  . Bipolar 1 disorder, depressed ([place]) 02/11/2015    APPLE[place], [place] MPT 08/22/2016, 11:45 AM  [facility] [address] [place], [place], [address] Phone: [phone]   Fax:  [phone]  Name: Angel Maddox MRN: [id number] Date of Birth: 03-29-1956

## 2016-08-23 ENCOUNTER — Ambulatory Visit: Payer: Medicare Other | Admitting: Pharmacist

## 2016-08-24 ENCOUNTER — Encounter: Payer: Self-pay | Admitting: Physical Therapy

## 2016-08-24 ENCOUNTER — Ambulatory Visit: Payer: Medicare Other | Admitting: Physical Therapy

## 2016-08-24 DIAGNOSIS — G8929 Other chronic pain: Secondary | ICD-10-CM

## 2016-08-24 DIAGNOSIS — M6281 Muscle weakness (generalized): Secondary | ICD-10-CM

## 2016-08-24 DIAGNOSIS — R293 Abnormal posture: Secondary | ICD-10-CM | POA: Diagnosis not present

## 2016-08-24 DIAGNOSIS — G543 Thoracic root disorders, not elsewhere classified: Secondary | ICD-10-CM | POA: Diagnosis not present

## 2016-08-24 DIAGNOSIS — R2689 Other abnormalities of gait and mobility: Secondary | ICD-10-CM | POA: Diagnosis not present

## 2016-08-24 DIAGNOSIS — M545 Low back pain: Secondary | ICD-10-CM | POA: Diagnosis not present

## 2016-08-24 DIAGNOSIS — M542 Cervicalgia: Secondary | ICD-10-CM

## 2016-08-24 NOTE — Therapy (Signed)
[facility] [place], [place], [address] Phone: [phone]   Fax:  [phone]  Physical Therapy Treatment  Patient Details  Name: Angel Maddox MRN: [id number] Date of Birth: Aug 02, 1955 Referring Provider: Rod Can MD  Encounter Date: 08/24/2016      PT End of Session - 08/24/16 1151    Visit Number 2   Number of Visits 16   Date for PT Re-Evaluation 10/21/16   PT Start Time 1114   PT Stop Time 1200   PT Time Calculation (min) 46 min   Activity Tolerance Patient tolerated treatment well   Behavior During Therapy [facility] for tasks assessed/performed      Past Medical History:  Diagnosis Date  . Anxiety   . Bipolar 1 disorder ([place])   . Chronic kidney disease    kidney stone  . Depression   . H/O hiatal hernia   . Hypertension   . Multiple sclerosis ([place])     Had for 15 years  . Tremors of nervous system     Past Surgical History:  Procedure Laterality Date  . HIP PINNING,CANNULATED Left 11/09/2015   Procedure: CANNULATED HIP PINNING;  Surgeon: Rod Can, MD;  Location: [facility];  Service: Orthopedics;  Laterality: Left;  . LIPOMA EXCISION    . TUBAL LIGATION      There were no vitals filed for this visit.      Subjective Assessment - 08/24/16 1120    Subjective Patient reported some improvement today   Limitations Sitting   How long can you sit comfortably? 5-10 minutes.   Patient Stated Goals Get out of pain.   Currently in Pain? Yes   Pain Score 8    Pain Location Back   Pain Orientation Left;Right;Lower;Mid   Pain Descriptors / Indicators Aching   Pain Type Chronic pain   Pain Onset More than a month ago   Pain Frequency Constant   Aggravating Factors  any one position too long   Pain Relieving Factors laying in bed   Pain Score 8   Pain Location Neck   Pain Orientation Left;Mid   Pain Descriptors / Indicators Aching;Headache   Pain Onset More than a month ago   Pain Frequency  Constant   Aggravating Factors  certain movements   Pain Relieving Factors at rest                         [facility] Adult PT Treatment/Exercise - 08/24/16 0001      Moist Heat Therapy   Number Minutes Moist Heat 15 Minutes   Moist Heat Location Cervical;Lumbar Spine     Electrical Stimulation   Electrical Stimulation Location left UT/bil back   Electrical Stimulation Action premod   Electrical Stimulation Parameters 80-150hz  x68min   Electrical Stimulation Goals Pain     Manual Therapy   Manual Therapy Myofascial release;Soft tissue mobilization   Soft tissue mobilization gentle STW to bil mid thoracic to low back paraspinals /and left c-spine/UT to area of tight muscles and pain                   PT Short Term Goals - 08/24/16 1157      PT SHORT TERM GOAL #1   Title Independent with an initial HEP.   Time 2   Period Weeks   Status On-going     PT SHORT TERM GOAL #2   Title Active bilateral cervical rotation= 60 degrees.  Time 2   Period Weeks   Status On-going           PT Long Term Goals - 08/22/16 1113      PT LONG TERM GOAL #1   Title Independent with an advanced HEP.   Time 8   Period Weeks   Status New     PT LONG TERM GOAL #2   Title Increase left hip abduction strength to a solid 4+/5 in order to eliminate her Trendelenburg gait pattern.   Time 8   Period Weeks   Status New     PT LONG TERM GOAL #3   Title Sit 20 minutes with pain not > 3/10.   Time 8   Period Weeks   Status New     PT LONG TERM GOAL #4   Title Eliminate left LE pain.     Time 8   Period Weeks   Status New     PT LONG TERM GOAL #5   Title Perform ADL's with pain not > 3/10.               Plan - 08/24/16 1158    Clinical Impression Statement Patient tolerated treatment well today. Patient had complaints with pain in cervical spine mid and radiates to the left, mid thoracic and low back bil sides. Patient had more pain on left side with  palpation. Patient responded well with manual STW and gentle myofascal release today. Current goals ongoing due to pain deficits.   Rehab Potential Good   PT Frequency 2x / week   PT Duration 8 weeks   PT Treatment/Interventions ADLs/Self Care Home Management;Cryotherapy;Electrical Stimulation;Moist Heat;Ultrasound;Therapeutic activities;Therapeutic exercise;Patient/family education;Manual techniques;Passive range of motion;Dry needling   PT Next Visit Plan cont with POC per MPT and per patient tolerance for Modalities; STW/M; Dry needling; active cervical and lumbar range of motion and progression into core exercises.   Consulted and Agree with Plan of Care Patient      Patient will benefit from skilled therapeutic intervention in order to improve the following deficits and impairments:  Pain, Decreased activity tolerance, Decreased range of motion, Decreased strength  Visit Diagnosis: Cervicalgia  Abnormal posture  Thoracic root disorders, not elsewhere classified  Chronic bilateral low back pain, with sciatica presence unspecified  Other abnormalities of gait and mobility  Muscle weakness (generalized)     Problem List Patient Active Problem List   Diagnosis Date Noted  . MDD (major depressive disorder), recurrent episode, severe ([place]) 03/29/2016  . Estrogen deficiency 12/15/2015  . MS (multiple sclerosis) ([place]) 12/15/2015  . Tremor 12/15/2015  . Healthcare maintenance 12/15/2015  . HTN (hypertension)   . Protein-calorie malnutrition, severe 11/09/2015  . Closed left hip fracture ([place]) 11/08/2015  . Bipolar 1 disorder, depressed ([place]) 02/11/2015    Dakarri Kessinger P, PTA 08/24/2016, 12:08 PM  [facility] [place], [place], [address] Phone: [phone]   Fax:  [phone]  Name: Angel Maddox MRN: [id number] Date of Birth: 1955/10/07

## 2016-08-25 ENCOUNTER — Ambulatory Visit: Payer: Medicare Other | Admitting: Pharmacist

## 2016-08-26 ENCOUNTER — Encounter: Payer: Self-pay | Admitting: Physical Therapy

## 2016-08-26 ENCOUNTER — Encounter: Payer: Self-pay | Admitting: Family Medicine

## 2016-08-29 ENCOUNTER — Encounter: Payer: Self-pay | Admitting: Physical Therapy

## 2016-08-29 ENCOUNTER — Ambulatory Visit: Payer: Medicare Other | Attending: Physical Medicine and Rehabilitation | Admitting: Physical Therapy

## 2016-08-29 DIAGNOSIS — M542 Cervicalgia: Secondary | ICD-10-CM | POA: Diagnosis not present

## 2016-08-29 DIAGNOSIS — G543 Thoracic root disorders, not elsewhere classified: Secondary | ICD-10-CM

## 2016-08-29 DIAGNOSIS — M545 Low back pain: Secondary | ICD-10-CM | POA: Diagnosis not present

## 2016-08-29 DIAGNOSIS — F411 Generalized anxiety disorder: Secondary | ICD-10-CM | POA: Diagnosis not present

## 2016-08-29 DIAGNOSIS — R293 Abnormal posture: Secondary | ICD-10-CM

## 2016-08-29 DIAGNOSIS — G8929 Other chronic pain: Secondary | ICD-10-CM

## 2016-08-29 NOTE — Therapy (Signed)
[facility] [place], [place], [address] Phone: [phone]   Fax:  [phone]  Physical Therapy Treatment  Patient Details  Name: Angel Maddox MRN: [id number] Date of Birth: 05-05-1956 Referring Provider: Rod Can MD  Encounter Date: 08/29/2016      PT End of Session - 08/29/16 0907    Visit Number 3   Number of Visits 16   Date for PT Re-Evaluation 10/21/16   PT Start Time 0820   PT Stop Time 0907   PT Time Calculation (min) 47 min      Past Medical History:  Diagnosis Date  . Anxiety   . Bipolar 1 disorder ([place])   . Chronic kidney disease    kidney stone  . Depression   . H/O hiatal hernia   . Hypertension   . Multiple sclerosis ([place])     Had for 15 years  . Tremors of nervous system     Past Surgical History:  Procedure Laterality Date  . HIP PINNING,CANNULATED Left 11/09/2015   Procedure: CANNULATED HIP PINNING;  Surgeon: Rod Can, MD;  Location: [facility];  Service: Orthopedics;  Laterality: Left;  . LIPOMA EXCISION    . TUBAL LIGATION      There were no vitals filed for this visit.      Subjective Assessment - 08/29/16 0905    Subjective Reports that her neck is worse today but has been with her husband in the hospital this weekend and has had to deal with pain to be with him. Reports having headaches all the time and cannot be comfortable with positioning.   Limitations Sitting   How long can you sit comfortably? 5-10 minutes.   Patient Stated Goals Get out of pain.   Currently in Pain? Yes   Pain Score 8             [facility] PT Assessment - 08/29/16 0001      Assessment   Medical Diagnosis Neck, thoracic and low back pain.     Precautions   Precautions Fall     Restrictions   Weight Bearing Restrictions No                     [facility] Adult PT Treatment/Exercise - 08/29/16 0001      Modalities   Modalities Electrical Stimulation;Moist Heat     Moist  Heat Therapy   Number Minutes Moist Heat 15 Minutes   Moist Heat Location Cervical;Lumbar Spine     Electrical Stimulation   Electrical Stimulation Location B UT, lumbar paraspinals   Electrical Stimulation Action Pre-Mod   Electrical Stimulation Parameters 80-150 hz x 15 min   Electrical Stimulation Goals Pain;Tone     Manual Therapy   Manual Therapy Soft tissue mobilization   Soft tissue mobilization STW to B UT, cervical paraspinals in sitting, B lumbar paraspinals and QL in L sidelying wih pillow between knees for comfort to reduce pain                  PT Short Term Goals - 08/24/16 1157      PT SHORT TERM GOAL #1   Title Independent with an initial HEP.   Time 2   Period Weeks   Status On-going     PT SHORT TERM GOAL #2   Title Active bilateral cervical rotation= 60 degrees.   Time 2   Period Weeks   Status On-going  PT Long Term Goals - 08/22/16 1113      PT LONG TERM GOAL #1   Title Independent with an advanced HEP.   Time 8   Period Weeks   Status New     PT LONG TERM GOAL #2   Title Increase left hip abduction strength to a solid 4+/5 in order to eliminate her Trendelenburg gait pattern.   Time 8   Period Weeks   Status New     PT LONG TERM GOAL #3   Title Sit 20 minutes with pain not > 3/10.   Time 8   Period Weeks   Status New     PT LONG TERM GOAL #4   Title Eliminate left LE pain.     Time 8   Period Weeks   Status New     PT LONG TERM GOAL #5   Title Perform ADL's with pain not > 3/10.               Plan - 08/29/16 0919    Clinical Impression Statement Patient arrived with continued reports of pain throughout her back but felt as if her neck was worse today. Patient intermittantly reported soreness in B UT, lumbar paraspinals during manual therapy. Patient was educated to report any discomfort with positioning as she could be adjusted. Normal modalities response noted following removal of the modalities.    Rehab Potential Good   PT Frequency 2x / week   PT Duration 8 weeks   PT Treatment/Interventions ADLs/Self Care Home Management;Cryotherapy;Electrical Stimulation;Moist Heat;Ultrasound;Therapeutic activities;Therapeutic exercise;Patient/family education;Manual techniques;Passive range of motion;Dry needling   PT Next Visit Plan cont with POC per MPT and per patient tolerance for Modalities; STW/M; Dry needling; active cervical and lumbar range of motion and progression into core exercises.   Consulted and Agree with Plan of Care Patient      Patient will benefit from skilled therapeutic intervention in order to improve the following deficits and impairments:  Pain, Decreased activity tolerance, Decreased range of motion, Decreased strength  Visit Diagnosis: Cervicalgia  Abnormal posture  Thoracic root disorders, not elsewhere classified  Chronic bilateral low back pain, with sciatica presence unspecified     Problem List Patient Active Problem List   Diagnosis Date Noted  . MDD (major depressive disorder), recurrent episode, severe ([place]) 03/29/2016  . Estrogen deficiency 12/15/2015  . MS (multiple sclerosis) ([place]) 12/15/2015  . Tremor 12/15/2015  . Healthcare maintenance 12/15/2015  . HTN (hypertension)   . Protein-calorie malnutrition, severe 11/09/2015  . Closed left hip fracture ([place]) 11/08/2015  . Bipolar 1 disorder, depressed ([place]) 02/11/2015    Angel Maddox, PTA 08/29/2016, 9:21 AM  [facility] [place], [place], [address] Phone: [phone]   Fax:  [phone]  Name: Angel Maddox MRN: [id number] Date of Birth: 1955/09/03

## 2016-08-30 ENCOUNTER — Ambulatory Visit: Payer: Medicare Other | Admitting: Physical Therapy

## 2016-08-30 ENCOUNTER — Encounter: Payer: Self-pay | Admitting: Pharmacist

## 2016-08-30 ENCOUNTER — Encounter: Payer: Self-pay | Admitting: Physical Therapy

## 2016-08-30 ENCOUNTER — Ambulatory Visit (INDEPENDENT_AMBULATORY_CARE_PROVIDER_SITE_OTHER): Payer: Medicare Other | Admitting: Pharmacist

## 2016-08-30 DIAGNOSIS — M8000XD Age-related osteoporosis with current pathological fracture, unspecified site, subsequent encounter for fracture with routine healing: Secondary | ICD-10-CM | POA: Diagnosis not present

## 2016-08-30 DIAGNOSIS — R293 Abnormal posture: Secondary | ICD-10-CM

## 2016-08-30 DIAGNOSIS — M545 Low back pain: Secondary | ICD-10-CM | POA: Diagnosis not present

## 2016-08-30 DIAGNOSIS — M542 Cervicalgia: Secondary | ICD-10-CM

## 2016-08-30 DIAGNOSIS — G543 Thoracic root disorders, not elsewhere classified: Secondary | ICD-10-CM | POA: Diagnosis not present

## 2016-08-30 DIAGNOSIS — G8929 Other chronic pain: Secondary | ICD-10-CM | POA: Diagnosis not present

## 2016-08-30 DIAGNOSIS — E559 Vitamin D deficiency, unspecified: Secondary | ICD-10-CM | POA: Diagnosis not present

## 2016-08-30 DIAGNOSIS — M8588 Other specified disorders of bone density and structure, other site: Secondary | ICD-10-CM | POA: Diagnosis not present

## 2016-08-30 MED ORDER — RISEDRONATE SODIUM 150 MG PO TABS: 150.0000 mg | ORAL_TABLET | ORAL | 3 refills | Status: DC

## 2016-08-30 NOTE — Therapy (Signed)
[facility] [place], [place], [address] Phone: [phone]   Fax:  [phone]  Physical Therapy Treatment  Patient Details  Name: Angel Maddox MRN: [id number] Date of Birth: 05/11/1956 Referring Provider: Rod Can MD  Encounter Date: 08/30/2016      PT End of Session - 08/30/16 1524    Visit Number 4   Number of Visits 16   Date for PT Re-Evaluation 10/21/16   PT Start Time 1528   PT Stop Time 1614   PT Time Calculation (min) 46 min   Activity Tolerance Patient tolerated treatment well   Behavior During Therapy [facility] for tasks assessed/performed      Past Medical History:  Diagnosis Date  . Anxiety   . Bipolar 1 disorder ([place])   . Chronic kidney disease    kidney stone  . Depression   . H/O hiatal hernia   . Hip fracture ([place]) 2017   left  . Hypertension   . Multiple sclerosis ([place])     Had for 15 years  . Tremors of nervous system     Past Surgical History:  Procedure Laterality Date  . HIP PINNING,CANNULATED Left 11/09/2015   Procedure: CANNULATED HIP PINNING;  Surgeon: Rod Can, MD;  Location: [facility];  Service: Orthopedics;  Laterality: Left;  . LIPOMA EXCISION    . TUBAL LIGATION      There were no vitals filed for this visit.      Subjective Assessment - 08/30/16 1524    Subjective Reports that at times she is so tender in her back that she has pain with touching a soft surface. Has been running around a lot today and woke up with stiffness in her neck. Patient states that she learned she had osteopenia today and was placed on a once monthly medication. Continues to report headaches at this time.   Limitations Sitting   How long can you sit comfortably? 5-10 minutes.   Patient Stated Goals Get out of pain.   Currently in Pain? Yes   Pain Score 4    Pain Location Back   Pain Orientation Right;Left   Pain Descriptors / Indicators Other (Comment)  Stiffness   Pain Type Chronic  pain   Pain Onset More than a month ago   Pain Score 0   Pain Location Back   Pain Orientation Mid;Lower            [facility] PT Assessment - 08/30/16 0001      Assessment   Medical Diagnosis Neck, thoracic and low back pain.     Precautions   Precautions Fall     Restrictions   Weight Bearing Restrictions No                     [facility] Adult PT Treatment/Exercise - 08/30/16 0001      Modalities   Modalities Electrical Stimulation;Moist Heat     Moist Heat Therapy   Number Minutes Moist Heat 15 Minutes   Moist Heat Location Cervical;Other (comment)  Thoracic     Electrical Stimulation   Electrical Stimulation Location B cervical paraspinals, thoracic paraspinals   Electrical Stimulation Action IFC   Electrical Stimulation Parameters 1-10 hz x15 min   Electrical Stimulation Goals Pain;Tone     Manual Therapy   Manual Therapy Soft tissue mobilization   Soft tissue mobilization STW to B UT, Levator Scapula, cervical paraspinals, suboccipitals as well thoracic paraspinals to reduce pain and tone  PT Short Term Goals - 08/24/16 1157      PT SHORT TERM GOAL #1   Title Independent with an initial HEP.   Time 2   Period Weeks   Status On-going     PT SHORT TERM GOAL #2   Title Active bilateral cervical rotation= 60 degrees.   Time 2   Period Weeks   Status On-going           PT Long Term Goals - 08/22/16 1113      PT LONG TERM GOAL #1   Title Independent with an advanced HEP.   Time 8   Period Weeks   Status New     PT LONG TERM GOAL #2   Title Increase left hip abduction strength to a solid 4+/5 in order to eliminate her Trendelenburg gait pattern.   Time 8   Period Weeks   Status New     PT LONG TERM GOAL #3   Title Sit 20 minutes with pain not > 3/10.   Time 8   Period Weeks   Status New     PT LONG TERM GOAL #4   Title Eliminate left LE pain.     Time 8   Period Weeks   Status New     PT LONG TERM  GOAL #5   Title Perform ADL's with pain not > 3/10.               Plan - 08/30/16 1606    Clinical Impression Statement Patient arrived today with far greater cervical pain today with no current LBP reported by patient. Increased tone noted throughout B UT, cervical paraspinals region but greater tone noted in R thoracic paraspinals upon palpation. Suboccipital release also conducted as patient reported pain present there today as well as continued headaches. Soreness reported throughout manual therapy to B UT and into cervical paraspinals today by patient. Normal modalities response noted following removal of the modalities.   Rehab Potential Good   PT Frequency 2x / week   PT Duration 8 weeks   PT Treatment/Interventions ADLs/Self Care Home Management;Cryotherapy;Electrical Stimulation;Moist Heat;Ultrasound;Therapeutic activities;Therapeutic exercise;Patient/family education;Manual techniques;Passive range of motion;Dry needling   PT Next Visit Plan cont with POC per MPT and per patient tolerance for Modalities; STW/M; Dry needling; active cervical and lumbar range of motion and progression into core exercises.   Consulted and Agree with Plan of Care Patient      Patient will benefit from skilled therapeutic intervention in order to improve the following deficits and impairments:  Pain, Decreased activity tolerance, Decreased range of motion, Decreased strength  Visit Diagnosis: Cervicalgia  Abnormal posture  Thoracic root disorders, not elsewhere classified  Chronic bilateral low back pain, with sciatica presence unspecified     Problem List Patient Active Problem List   Diagnosis Date Noted  . Osteoporosis with pathological fracture, with routine healing, subsequent encounter 08/30/2016  . MDD (major depressive disorder), recurrent episode, severe ([place]) 03/29/2016  . Estrogen deficiency 12/15/2015  . MS (multiple sclerosis) ([place]) 12/15/2015  . Tremor 12/15/2015  .  Healthcare maintenance 12/15/2015  . HTN (hypertension)   . Protein-calorie malnutrition, severe 11/09/2015  . Closed left hip fracture ([place]) 11/08/2015  . Bipolar 1 disorder, depressed ([place]) 02/11/2015    [name], PTA 08/30/2016, 4:19 PM  [facility] [address] [place], [place], [address] Phone: [phone]   Fax:  [phone]  Name: Angel Maddox MRN: [id number] Date of Birth: July 23, 1955

## 2016-08-30 NOTE — Progress Notes (Signed)
Patient ID: Angel Maddox, female   DOB: 1955-07-27, 61 y.o.   MRN: [id number]      HPI: Had DEXA several years ago that was normal.  Most recent DEXA showed BMD in osteopenic range in hip and spine.  Patient has history of hip fracture 2017.  Back Pain?  No       Kyphosis?  No Prior fracture?  Yes - fractured left hip (10/2015) Med(s) for Osteoporosis/Osteopenia:  Calcium qd Med(s) previously tried for Osteoporosis/Osteopenia:  None  Patient is taking several meds that make her a high fall risk. Most are prescribed by physchitrist                                                             PMH: Age at menopause:  10's Hysterectomy?  No Oophorectomy?  No HRT? No Steroid Use?  No Thyroid med?  No History of cancer?  No History of digestive disorders (ie Crohn's)?  No Current or previous eating disorders?  No Last Vitamin D Result:  Not checked Last GFR Result:  Over 60 (07/25/2016)   FH/SH: Family history of osteoporosis?  No Parent with history of hip fracture?  No Family history of breast cancer?  Yes 0 maternal aunt Exercise?  No - due to DDD; patient is receiving physical therapy currently which was prescribed by Dr Nelva Bush. Smoking?  No Alcohol?  No    Calcium Assessment Calcium Intake  # of servings/day  Calcium mg  Milk (8 oz) 0  x  300  = 0  Yogurt (4 oz) 0 x  200 = 0  Cheese (1 oz) 0 x  200 = 0  Other Calcium sources   250mg   Ca supplement MVI and calcium supplement = 800mg    Estimated calcium intake per day 1050mg     DEXA Results Date of Test T-Score for AP Spine L1-L2 T-Score for left forearm radius 33% T-Score for neck Right Hip  08/17/2016 -1.1 0.2 -1.8                   Assessment: Osteoporosis with pathological fracture of hip 2017  Recommendations: 1.  Start  risedronate (ACTONEL) 150mg  take 1 tablet monthly - discussed proper administration  2.  recommend calcium 1200mg  daily through supplementation or diet. Patient to continue calcium  supplement and MVI but advised to separate administration 3.  recommend weight bearing exercise - recommended to wait until cleared by orthopedist Dr Nelva Bush.  Continue PT for now.   4.  Counseled and educated about fall risk and prevention. Patient is working with psychiatry to decrease and discontinue many medications.  Discussed high risk medications and effects on falls and bone health.  Orders Placed This Encounter  Procedures  . VITAMIN D 25 Hydroxy (Vit-D Deficiency, Fractures)     Recheck DEXA:  2 years  Time spent counseling patient:  30 minutes

## 2016-08-30 NOTE — Patient Instructions (Addendum)
Calcium & Vitamin D: The Facts  ** MAKE SURE TO SEPARATE CALCIUM SUPPLEMENT AND MULTIVITAMIN (TAKE ONE IN MORNING AND ONE IN EVENING)  Why is calcium and vitamin D consumption important? Calcium: . Most Americans do not consume adequate amounts of calcium! Calcium is required for proper muscle function, nerve communication, bone support, and many other functions in the body.  . The body uses bones as a source of calcium. Bones 'remodel' themselves continuously - the body constantly breaks bone down to release calcium and rebuilds bones by replacing calcium in the bone later.  . As we get older, the rate of bone breakdown occurs faster than bone rebuilding which could lead to osteopenia, osteoporosis, and possible fractures.   Vitamin D: . People naturally make vitamin D in the body when sunlight hits the skin and triggers a process that leads to vitamin D production. This natural vitamin D production requires about 10-15 minutes of sun exposure on the hands, arms, and face at least 2-3 times per week. However, due to decreased sun exposure and the use of sunscreen, most people will need to get additional vitamin D from foods or supplements. Your doctor can measure your body's vitamin D level through a simple blood test to determine your daily vitamin D needs.  . Vitamin D is used to help the body absorb calcium, maintain bone health, help the immune system, and reduce inflammation. It also plays a role in muscle performance, balance and risk of falling.  . Vitamin D deficiency can lead to osteomalacia or softening of the bones, bone pain, and muscle weakness.   The recommended daily allowance of Calcium and Vitamin D varies for different age groups. Age group Calcium (mg) Vitamin D (IU)  Females and Males: Age 32-50 1000 mg 600 IU  Females: Age 68- 48 1200 mg 600 IU  Males: Age 15-70 1000 mg 600 IU  Females and Males: Age 14+ 1200 mg 800 IU  Pregnant/lactating Females age 6-50 1000 mg 600 IU    How much Calcium do you get in your diet? Calcium Intake # of servings per day  Total calcium (mg)  Skim milk, 2% milk (1 cup) _________ x 300 mg   Yogurt (1 small container) _________ x 200 mg   Cheese (1oz) _________ x 200 mg   Cottage Cheese (1 cup)             ________ x 150 mg   Almond milk (1 cup) _________ x 450 mg   Fortified Orange Juice (1 cup) _________ x 300 mg   Broccoli or spinach ( 1 cup) _________ x 100 mg   Salmon (3 oz) _________ x 150 mg    Almonds (1/4 cup) _______ x 90 mg      How do we get Calcium and Vitamin D in our diet? Calcium: . Obtaining calcium from the diet is the most preferred way to reach the recommended daily goal. If this goal is not reached through diet, calcium supplements are available.  . Calcium is found in many foods including: dairy products, dark leafy vegetables (like broccoli, kale, and spinach), fish, and fortified products like juices and cereals.  . The food label will have a %DV (percent daily value) listed showing the amount of calcium per serving. To determine the total mg per serving, simply replace the % with zero (0).  For example, Almond Breeze almond milk contains 45% DV of calcium or 464m per 1 cup.  . You can increase the amount of  calcium in your diet by using more calcium products in your daily meals. Use yogurt and fruit to make smoothies or use yogurt to top baked potatoes or make whipped potatoes. Sprinkle low fat cheese onto salads or into egg white omelets. You can even add non-fat dry milk powder (361m calcium per 1/3 cup) to hot cereals, meat loaf, soups, or potatoes.  . Calcium supplements come in many forms including tablets, chewables, and gummies. Be sure to read the label to determine the correct number of tablets per serving and whether or not to take the supplement with food.  . Calcium carbonate products (Oscal, Caltrate, and Viactiv) are generally better absorbed when taken with food while calcium citrate  products like Citracal can be taken with or without food.  . The body can only absorb about 600 mg of calcium at one time. It is recommended to take calcium supplements in small amounts several times per day.  However, taking it all at once is better than not taking it at all. . Increasing your intake of calcium is essential for bone health, but may also lead to some side effects like constipation, increased gas, bloating or abdominal cramping. To help reduce these side effects, start with 1 tablet per day and slowly increase your intake of the supplement to the recommended doses. It is also recommended that you drink plenty of water each day. Vitamin D: . Very few foods naturally contain vitamin D. However, it is found in saltwater fish (like tuna, salmon and mackerel), beef liver, egg yolks, cheese and vitamin D fortified foods (like yogurt, cereals, orange juice and milk) . The amount of vitamin D in each food or product is listed as %DV on the product label. To determine the total amount of vitamin D per serving, drop the % sign and multiply the number by 4. For example, 1 cup of Almond Breeze almond milk contains 25% DV vitamin D or 100 IU per serving (25 x 4 =100). . Vitamin D is also found in multivitamins and supplements and may be listed as ergocalciferol (vitamin D2) or cholecalciferol (vitamin D3). Each of these forms of vitamin D are equivalent and the daily recommended intake will vary based on your age and the vitamin D levels in your body. Follow your doctor's recommendation for vitamin D intake.                    Exercise for Strong Bones  Exercise is important to build and maintain strong bones / bone density.  There are 2 types of exercises that are important to building and maintaining strong bones:  Weight- bearing and muscle-stregthening.  Weight-bearing Exercises  These exercises include activities that make you move against gravity while staying upright. Weight-bearing  exercises can be high-impact or low-impact.  High-impact weight-bearing exercises help build bones and keep them strong. If you have broken a bone due to osteoporosis or are at risk of breaking a bone, you may need to avoid high-impact exercises. If you're not sure, you should check with your healthcare provider.  Examples of high-impact weight-bearing exercises are: Dancing  Doing high-impact aerobics  Hiking  Jogging/running  Jumping Rope  Stair climbing  Tennis  Low-impact weight-bearing exercises can also help keep bones strong and are a safe alternative if you cannot do high-impact exercises.   Examples of low-impact weight-bearing exercises are: Using elliptical training machines  Doing low-impact aerobics  Using stair-step machines  Fast walking on a treadmill or outside  Muscle-Strengthening Exercises These exercises include activities where you move your body, a weight or some other resistance against gravity. They are also known as resistance exercises and include: Lifting weights  Using elastic exercise bands  Using weight machines  Lifting your own body weight  Functional movements, such as standing and rising up on your toes  Yoga and Pilates can also improve strength, balance and flexibility. However, certain positions may not be safe for people with osteoporosis or those at increased risk of broken bones. For example, exercises that have you bend forward may increase the chance of breaking a bone in the spine.   Non-Impact Exercises There are other types of exercises that can help prevent falls.  Non-impact exercises can help you to improve balance, posture and how well you move in everyday activities. Some of these exercises include: Balance exercises that strengthen your legs and test your balance, such as Tai Chi, can decrease your risk of falls.  Posture exercises that improve your posture and reduce rounded or "sloping" shoulders can help you decrease the  chance of breaking a bone, especially in the spine.  Functional exercises that improve how well you move can help you with everyday activities and decrease your chance of falling and breaking a bone. For example, if you have trouble getting up from a chair or climbing stairs, you should do these activities as exercises.   **A physical therapist can teach you balance, posture and functional exercises. He/she can also help you learn which exercises are safe and appropriate for you.  [facility] has a physical therapy office in [place] in front of our office and referrals can be made for assessments and treatment as needed and strength and balance training.  If you would like to have an assessment with [facility] and our physical therapy team please let a nurse or provider know.   Fall Prevention in the Home Falls can cause injuries and can affect people from all age groups. There are many simple things that you can do to make your home safe and to help prevent falls.  CONTINUE TO WORK WITH PSYCHIATRIST TO LOWER DOSES OF HIGH RISK MEDICATIONS - GABAPENTIN, CLONAZEPAM, CITALOPRAM, RISPERIDONE, HYDROXYZINE.  What can I do on the outside of my home?  Regularly repair the edges of walkways and driveways and fix any cracks.  Remove high doorway thresholds.  Trim any shrubbery on the main path into your home.  Use bright outdoor lighting.  Clear walkways of debris and clutter, including tools and rocks.  Regularly check that handrails are securely fastened and in good repair. Both sides of any steps should have handrails.  Install guardrails along the edges of any raised decks or porches.  Have leaves, snow, and ice cleared regularly.  Use sand or salt on walkways during winter months.  In the garage, clean up any spills right away, including grease or oil spills. What can I do in the bathroom?  Use night lights.  Install grab bars by the toilet and in the tub and shower. Do not use towel bars  as grab bars.  Use non-skid mats or decals on the floor of the tub or shower.  If you need to sit down while you are in the shower, use a plastic, non-slip stool.  Keep the floor dry. Immediately clean up any water that spills on the floor.  Remove soap buildup in the tub or shower on a regular basis.  Attach bath mats securely with double-sided non-slip rug tape.  Remove throw rugs and other tripping hazards from the floor. What can I do in the bedroom?  Use night lights.  Make sure that a bedside light is easy to reach.  Do not use oversized bedding that drapes onto the floor.  Have a firm chair that has side arms to use for getting dressed.  Remove throw rugs and other tripping hazards from the floor. What can I do in the kitchen?  Clean up any spills right away.  Avoid walking on wet floors.  Place frequently used items in easy-to-reach places.  If you need to reach for something above you, use a sturdy step stool that has a grab bar.  Keep electrical cables out of the way.  Do not use floor polish or wax that makes floors slippery. If you have to use wax, make sure that it is non-skid floor wax.  Remove throw rugs and other tripping hazards from the floor. What can I do in the stairways?  Do not leave any items on the stairs.  Make sure that there are handrails on both sides of the stairs. Fix handrails that are broken or loose. Make sure that handrails are as long as the stairways.  Check any carpeting to make sure that it is firmly attached to the stairs. Fix any carpet that is loose or worn.  Avoid having throw rugs at the top or bottom of stairways, or secure the rugs with carpet tape to prevent them from moving.  Make sure that you have a light switch at the top of the stairs and the bottom of the stairs. If you do not have them, have them installed. What are some other fall prevention tips?  Wear closed-toe shoes that fit well and support your feet. Wear  shoes that have rubber soles or low heels.  When you use a stepladder, make sure that it is completely opened and that the sides are firmly locked. Have someone hold the ladder while you are using it. Do not climb a closed stepladder.  Add color or contrast paint or tape to grab bars and handrails in your home. Place contrasting color strips on the first and last steps.  Use mobility aids as needed, such as canes, walkers, scooters, and crutches.  Turn on lights if it is dark. Replace any light bulbs that burn out.  Set up furniture so that there are clear paths. Keep the furniture in the same spot.  Fix any uneven floor surfaces.  Choose a carpet design that does not hide the edge of steps of a stairway.  Be aware of any and all pets.  Review your medicines with your healthcare provider. Some medicines can cause dizziness or changes in blood pressure, which increase your risk of falling. Talk with your health care provider about other ways that you can decrease your risk of falls. This may include working with a physical therapist or trainer to improve your strength, balance, and endurance. This information is not intended to replace advice given to you by your health care provider. Make sure you discuss any questions you have with your health care provider. Document Released: 06/03/2002 Document Revised: 11/10/2015 Document Reviewed: 07/18/2014 Elsevier Interactive Patient Education  2017 [employer].

## 2016-08-31 ENCOUNTER — Encounter: Payer: Self-pay | Admitting: Physical Therapy

## 2016-08-31 LAB — VITAMIN D 25 HYDROXY (VIT D DEFICIENCY, FRACTURES): Vit D, 25-Hydroxy: 35 ng/mL (ref 30.0–100.0)

## 2016-08-31 NOTE — Progress Notes (Signed)
Patient aware.

## 2016-09-01 ENCOUNTER — Encounter: Payer: Self-pay | Admitting: Family Medicine

## 2016-09-01 ENCOUNTER — Ambulatory Visit (INDEPENDENT_AMBULATORY_CARE_PROVIDER_SITE_OTHER): Payer: Medicare Other | Admitting: Family Medicine

## 2016-09-01 VITALS — BP 129/88 | HR 69 | Temp 98.1°F | Ht 68.0 in | Wt 128.0 lb

## 2016-09-01 DIAGNOSIS — I10 Essential (primary) hypertension: Secondary | ICD-10-CM | POA: Diagnosis not present

## 2016-09-01 DIAGNOSIS — M940 Chondrocostal junction syndrome [Tietze]: Secondary | ICD-10-CM | POA: Diagnosis not present

## 2016-09-01 DIAGNOSIS — M542 Cervicalgia: Secondary | ICD-10-CM

## 2016-09-01 MED ORDER — MELOXICAM 15 MG PO TABS: 15.0000 mg | ORAL_TABLET | Freq: Every day | ORAL | 1 refills | Status: DC

## 2016-09-01 NOTE — Progress Notes (Signed)
   HPI  Patient presents today for follow-up of neck and back pain.  Patient was seen 2 weeks ago with severe neck and back pain. She has had some improvement with meloxicam and Tylenol. She's also having improvement with chiropractic care. She's tried physical therapy, she is making some improvement with that as well.  She's wondering to call her orthopedic surgeon as described in the last note to discuss an MRI. Return neck pain is the most severe, at times she has subjective weakness in her bilateral arms.  No history of PUD, kidney disease, or heart disease. Patient has had some soreness in the chest over the last 2 weeks, it improves with stretching and movement. No pain today.  PMH: Smoking status noted ROS: Per HPI  Objective: BP 129/88   Pulse 69   Temp 98.1 F (36.7 C) (Oral)   Ht 5\' 8"  (1.727 m)   Wt 128 lb (58.1 kg)   BMI 19.46 kg/m  Gen: NAD, alert, cooperative with exam HEENT: NCAT Chest wall: Tenderness to palpation of the left sided costosternal joints with reproduction of pain. CV: RRR, good S1/S2, no murmur Resp: CTABL, no wheezes, non-labored Ext: No edema, warm Neuro: Alert and oriented, Strength 4/5 and symmetric in BL grip and biceps/triceps  Assessment and plan:  # Neck pain  Still severe, meloxicam helping Discussed PUD ppx- Pepcid Refill X 2 months No appreciate weakness on exam, does have subjective weakness  # Costochondritis Likely from new PT regimen Continue meloxicam, ice if needed  # HTN Stable, controlled On chlorthalidone No changes  Laroy Apple, MD [facility] 09/01/2016, 10:31 AM

## 2016-09-01 NOTE — Patient Instructions (Signed)
Great to see you!  Continue meloxicam and tylenol, ( avoid ibuprofen and aleve) Come back in 2 months to see if we can reduce your dose.  I recommend taking pepcid twice daily to protect your stomach from meloxicam

## 2016-09-02 ENCOUNTER — Encounter: Payer: Self-pay | Admitting: Physical Therapy

## 2016-09-02 ENCOUNTER — Ambulatory Visit: Payer: Medicare Other | Admitting: Physical Therapy

## 2016-09-02 DIAGNOSIS — M542 Cervicalgia: Secondary | ICD-10-CM

## 2016-09-02 DIAGNOSIS — M545 Low back pain: Secondary | ICD-10-CM

## 2016-09-02 DIAGNOSIS — G8929 Other chronic pain: Secondary | ICD-10-CM

## 2016-09-02 DIAGNOSIS — R293 Abnormal posture: Secondary | ICD-10-CM

## 2016-09-02 DIAGNOSIS — G543 Thoracic root disorders, not elsewhere classified: Secondary | ICD-10-CM

## 2016-09-02 NOTE — Therapy (Signed)
[facility] [place], [place], [address] Phone: [phone]   Fax:  [phone]  Physical Therapy Treatment  Patient Details  Name: Angel Maddox MRN: [id number] Date of Birth: [birth year] Referring Provider: Rod Can MD  Encounter Date: 09/02/2016      PT End of Session - 09/02/16 0816    Visit Number 5   Number of Visits 16   Date for PT Re-Evaluation 10/21/16   PT Start Time 0816   PT Stop Time 0909   PT Time Calculation (min) 53 min   Activity Tolerance Patient tolerated treatment well   Behavior During Therapy [facility] for tasks assessed/performed      Past Medical History:  Diagnosis Date  . Anxiety   . Bipolar 1 disorder ([place])   . Chronic kidney disease    kidney stone  . Depression   . H/O hiatal hernia   . Hip fracture ([place]) 2017   left  . Hypertension   . Multiple sclerosis ([place])     Had for 15 years  . Tremors of nervous system     Past Surgical History:  Procedure Laterality Date  . HIP PINNING,CANNULATED Left 11/09/2015   Procedure: CANNULATED HIP PINNING;  Surgeon: Rod Can, MD;  Location: [facility];  Service: Orthopedics;  Laterality: Left;  . LIPOMA EXCISION    . TUBAL LIGATION      There were no vitals filed for this visit.      Subjective Assessment - 09/02/16 0813    Subjective Reports that she had relief in her neck following previous treatment. But left and went to [facility] and then went home and had enough pain to lay down with TENS unit. Reports that she had some LBP last night but feels okay this morning.   Limitations Sitting   How long can you sit comfortably? 5-10 minutes.   Patient Stated Goals Get out of pain.   Currently in Pain? Yes   Pain Score 5    Pain Location Neck   Pain Orientation Right;Left   Pain Descriptors / Indicators Tightness   Pain Type Chronic pain   Pain Radiating Towards down to between B scapula and into B shoulders   Pain Onset More  than a month ago   Pain Score 0   Pain Location Back   Pain Orientation Lower            [facility] PT Assessment - 09/02/16 0001      Assessment   Medical Diagnosis Neck, thoracic and low back pain.     Precautions   Precautions Fall     Restrictions   Weight Bearing Restrictions No                     [facility] Adult PT Treatment/Exercise - 09/02/16 0001      Modalities   Modalities Cryotherapy;Electrical Stimulation;Ultrasound     Cryotherapy   Number Minutes Cryotherapy 15 Minutes   Cryotherapy Location Shoulder;Cervical;Lumbar Spine   Type of Cryotherapy Ice pack     Electrical Stimulation   Electrical Stimulation Location B cervical paraspinals, thoracic paraspinals   Electrical Stimulation Action IFC   Electrical Stimulation Parameters 80-150 hz x15 min   Electrical Stimulation Goals Pain;Tone     Ultrasound   Ultrasound Location B UT   Ultrasound Parameters 1.5 w/cm2, 100%, 1 mhz x10 min   Ultrasound Goals Pain     Manual Therapy   Manual Therapy Soft tissue mobilization  Soft tissue mobilization STW to B UT, Levator Scapula, cervical paraspinals as well thoracic paraspinals to reduce pain and tone                   PT Short Term Goals - 08/24/16 1157      PT SHORT TERM GOAL #1   Title Independent with an initial HEP.   Time 2   Period Weeks   Status On-going     PT SHORT TERM GOAL #2   Title Active bilateral cervical rotation= 60 degrees.   Time 2   Period Weeks   Status On-going           PT Long Term Goals - 08/22/16 1113      PT LONG TERM GOAL #1   Title Independent with an advanced HEP.   Time 8   Period Weeks   Status New     PT LONG TERM GOAL #2   Title Increase left hip abduction strength to a solid 4+/5 in order to eliminate her Trendelenburg gait pattern.   Time 8   Period Weeks   Status New     PT LONG TERM GOAL #3   Title Sit 20 minutes with pain not > 3/10.   Time 8   Period Weeks   Status New      PT LONG TERM GOAL #4   Title Eliminate left LE pain.     Time 8   Period Weeks   Status New     PT LONG TERM GOAL #5   Title Perform ADL's with pain not > 3/10.               Plan - 09/02/16 0951    Clinical Impression Statement Patient continues to present in clinic with greater cervical pain than lumbar pain today. Soreness and tone experienced and palpated by patient througout manual therapy to B UT and into cervical paraspinals region. Patient experienced increased sensitivity and discomfort to scar along L medial border of scapula. Normal modalities response noted following removal of the modalities. Cryotherapy utilized today secondary to patient's reports of greater relief from ice packs than moist heat.   Rehab Potential Good   PT Frequency 2x / week   PT Duration 8 weeks   PT Treatment/Interventions ADLs/Self Care Home Management;Cryotherapy;Electrical Stimulation;Moist Heat;Ultrasound;Therapeutic activities;Therapeutic exercise;Patient/family education;Manual techniques;Passive range of motion;Dry needling   PT Next Visit Plan cont with POC per MPT and per patient tolerance for Modalities; STW/M; Dry needling; active cervical and lumbar range of motion and progression into core exercises.   Consulted and Agree with Plan of Care Patient      Patient will benefit from skilled therapeutic intervention in order to improve the following deficits and impairments:  Pain, Decreased activity tolerance, Decreased range of motion, Decreased strength  Visit Diagnosis: Cervicalgia  Abnormal posture  Thoracic root disorders, not elsewhere classified  Chronic bilateral low back pain, with sciatica presence unspecified     Problem List Patient Active Problem List   Diagnosis Date Noted  . Osteoporosis with pathological fracture, with routine healing, subsequent encounter 08/30/2016  . MDD (major depressive disorder), recurrent episode, severe ([place]) 03/29/2016  . Estrogen  deficiency 12/15/2015  . MS (multiple sclerosis) ([place]) 12/15/2015  . Tremor 12/15/2015  . Healthcare maintenance 12/15/2015  . HTN (hypertension)   . Protein-calorie malnutrition, severe 11/09/2015  . Closed left hip fracture ([place]) 11/08/2015  . Bipolar 1 disorder, depressed ([place]) 02/11/2015    [name], PTA 09/02/2016,  9:59 AM  [facility] [place], [place], [address] Phone: [phone]   Fax:  [phone]  Name: Angel Maddox MRN: [id number] Date of Birth: April 13, 1956

## 2016-09-05 ENCOUNTER — Encounter: Payer: Self-pay | Admitting: Physical Therapy

## 2016-09-05 ENCOUNTER — Ambulatory Visit: Payer: Medicare Other | Admitting: Physical Therapy

## 2016-09-05 DIAGNOSIS — G8929 Other chronic pain: Secondary | ICD-10-CM

## 2016-09-05 DIAGNOSIS — R293 Abnormal posture: Secondary | ICD-10-CM

## 2016-09-05 DIAGNOSIS — M542 Cervicalgia: Secondary | ICD-10-CM

## 2016-09-05 DIAGNOSIS — M545 Low back pain: Secondary | ICD-10-CM | POA: Diagnosis not present

## 2016-09-05 DIAGNOSIS — G543 Thoracic root disorders, not elsewhere classified: Secondary | ICD-10-CM

## 2016-09-05 NOTE — Therapy (Signed)
[facility] [place], [place], [address] Phone: [phone]   Fax:  [phone]  Physical Therapy Treatment  Patient Details  Name: Angel Maddox MRN: [id number] Date of Birth: Feb 05, 1956 Referring Provider: Rod Can MD  Encounter Date: 09/05/2016      PT End of Session - 09/05/16 0823    Visit Number 6   Number of Visits 16   Date for PT Re-Evaluation 10/21/16   PT Start Time 0823   PT Stop Time 0907   PT Time Calculation (min) 44 min   Activity Tolerance Patient tolerated treatment well   Behavior During Therapy [facility] for tasks assessed/performed      Past Medical History:  Diagnosis Date  . Anxiety   . Bipolar 1 disorder ([place])   . Chronic kidney disease    kidney stone  . Depression   . H/O hiatal hernia   . Hip fracture ([place]) 2017   left  . Hypertension   . Multiple sclerosis ([place])     Had for 15 years  . Tremors of nervous system     Past Surgical History:  Procedure Laterality Date  . HIP PINNING,CANNULATED Left 11/09/2015   Procedure: CANNULATED HIP PINNING;  Surgeon: Rod Can, MD;  Location: [facility];  Service: Orthopedics;  Laterality: Left;  . LIPOMA EXCISION    . TUBAL LIGATION      There were no vitals filed for this visit.      Subjective Assessment - 09/05/16 0821    Subjective Reports that upon waking this morning she had neither cervical or low back pain. Has been up for a while and is now beginning to experience mid back and shoulder into cervical pain. Trying to stay ahead of pain with taking Meloxicam. Reports that her spine is out of alignment and asked of traction.   Limitations Sitting   How long can you sit comfortably? 5-10 minutes.   Patient Stated Goals Get out of pain.   Currently in Pain? Yes   Pain Score 2    Pain Location Back   Pain Orientation Right;Left;Upper   Pain Type Chronic pain   Pain Onset More than a month ago            [facility] PT  Assessment - 09/05/16 0001      Assessment   Medical Diagnosis Neck, thoracic and low back pain.     Precautions   Precautions Fall     Restrictions   Weight Bearing Restrictions No                     [facility] Adult PT Treatment/Exercise - 09/05/16 0001      Modalities   Modalities Cryotherapy;Electrical Stimulation     Cryotherapy   Number Minutes Cryotherapy 15 Minutes   Cryotherapy Location Shoulder;Cervical   Type of Cryotherapy Ice pack     Electrical Stimulation   Electrical Stimulation Location B cervical paraspinals, thoracic paraspinals   Electrical Stimulation Action IFC   Electrical Stimulation Parameters 80-150 hz x15 min   Electrical Stimulation Goals Pain;Tone     Manual Therapy   Manual Therapy Soft tissue mobilization   Soft tissue mobilization STW to B UT, Levator Scapula, cervical paraspinals as well thoracic paraspinals to reduce pain and tone                   PT Short Term Goals - 08/24/16 1157      PT SHORT TERM GOAL #1  Title Independent with an initial HEP.   Time 2   Period Weeks   Status On-going     PT SHORT TERM GOAL #2   Title Active bilateral cervical rotation= 60 degrees.   Time 2   Period Weeks   Status On-going           PT Long Term Goals - 08/22/16 1113      PT LONG TERM GOAL #1   Title Independent with an advanced HEP.   Time 8   Period Weeks   Status New     PT LONG TERM GOAL #2   Title Increase left hip abduction strength to a solid 4+/5 in order to eliminate her Trendelenburg gait pattern.   Time 8   Period Weeks   Status New     PT LONG TERM GOAL #3   Title Sit 20 minutes with pain not > 3/10.   Time 8   Period Weeks   Status New     PT LONG TERM GOAL #4   Title Eliminate left LE pain.     Time 8   Period Weeks   Status New     PT LONG TERM GOAL #5   Title Perform ADL's with pain not > 3/10.               Plan - 09/05/16 0857    Clinical Impression Statement  Patient continues to present with greater thoracic and cervical pain. Soreness noted predominately in B UT region but somewhat greater in L UT. Increased tone noted mostly throughout B UT region as well. Normal modalities response noted following removal of the modalities.   Rehab Potential Good   PT Frequency 2x / week   PT Duration 8 weeks   PT Treatment/Interventions ADLs/Self Care Home Management;Cryotherapy;Electrical Stimulation;Moist Heat;Ultrasound;Therapeutic activities;Therapeutic exercise;Patient/family education;Manual techniques;Passive range of motion;Dry needling   PT Next Visit Plan cont with POC per MPT and per patient tolerance for Modalities; STW/M; Dry needling; active cervical and lumbar range of motion and progression into core exercises.   Consulted and Agree with Plan of Care Patient      Patient will benefit from skilled therapeutic intervention in order to improve the following deficits and impairments:  Pain, Decreased activity tolerance, Decreased range of motion, Decreased strength  Visit Diagnosis: Cervicalgia  Abnormal posture  Thoracic root disorders, not elsewhere classified  Chronic bilateral low back pain, with sciatica presence unspecified     Problem List Patient Active Problem List   Diagnosis Date Noted  . Osteoporosis with pathological fracture, with routine healing, subsequent encounter 08/30/2016  . MDD (major depressive disorder), recurrent episode, severe ([place]) 03/29/2016  . Estrogen deficiency 12/15/2015  . MS (multiple sclerosis) ([place]) 12/15/2015  . Tremor 12/15/2015  . Healthcare maintenance 12/15/2015  . HTN (hypertension)   . Protein-calorie malnutrition, severe 11/09/2015  . Closed left hip fracture ([place]) 11/08/2015  . Bipolar 1 disorder, depressed ([place]) 02/11/2015    [name], PTA 09/05/2016, 9:12 AM  [facility] [address] [place], [place], [address] Phone: [phone]    Fax:  [phone]  Name: KERIANA SARSFIELD MRN: [id number] Date of Birth: 1955-07-14

## 2016-09-06 DIAGNOSIS — G8929 Other chronic pain: Secondary | ICD-10-CM | POA: Diagnosis not present

## 2016-09-06 DIAGNOSIS — M542 Cervicalgia: Secondary | ICD-10-CM | POA: Diagnosis not present

## 2016-09-06 DIAGNOSIS — M545 Low back pain: Secondary | ICD-10-CM | POA: Diagnosis not present

## 2016-09-07 ENCOUNTER — Encounter: Payer: Self-pay | Admitting: Physical Therapy

## 2016-09-07 ENCOUNTER — Ambulatory Visit: Payer: Medicare Other | Admitting: Physical Therapy

## 2016-09-07 DIAGNOSIS — R293 Abnormal posture: Secondary | ICD-10-CM

## 2016-09-07 DIAGNOSIS — M545 Low back pain: Secondary | ICD-10-CM | POA: Diagnosis not present

## 2016-09-07 DIAGNOSIS — G543 Thoracic root disorders, not elsewhere classified: Secondary | ICD-10-CM | POA: Diagnosis not present

## 2016-09-07 DIAGNOSIS — G8929 Other chronic pain: Secondary | ICD-10-CM

## 2016-09-07 DIAGNOSIS — M542 Cervicalgia: Secondary | ICD-10-CM

## 2016-09-07 NOTE — Therapy (Signed)
[facility] [place], [place], [address] Phone: [phone]   Fax:  [phone]  Physical Therapy Treatment  Patient Details  Name: Angel Maddox MRN: [id number] Date of Birth: 20-Nov-1955 Referring Provider: Rod Can MD  Encounter Date: 09/07/2016      PT End of Session - 09/07/16 0826    Visit Number 7   Number of Visits 16   Date for PT Re-Evaluation 10/21/16   PT Start Time 0826   PT Stop Time 0910   PT Time Calculation (min) 44 min   Activity Tolerance Patient tolerated treatment well;Patient limited by pain   Behavior During Therapy [facility] for tasks assessed/performed      Past Medical History:  Diagnosis Date  . Anxiety   . Bipolar 1 disorder ([place])   . Chronic kidney disease    kidney stone  . Depression   . H/O hiatal hernia   . Hip fracture ([place]) 2017   left  . Hypertension   . Multiple sclerosis ([place])     Had for 15 years  . Tremors of nervous system     Past Surgical History:  Procedure Laterality Date  . HIP PINNING,CANNULATED Left 11/09/2015   Procedure: CANNULATED HIP PINNING;  Surgeon: Rod Can, MD;  Location: [facility];  Service: Orthopedics;  Laterality: Left;  . LIPOMA EXCISION    . TUBAL LIGATION      There were no vitals filed for this visit.      Subjective Assessment - 09/07/16 0825    Subjective Reports not hurting a lot yesterday at all until she laid on hard MRI table. States that she has been up since 1 am.   Limitations Sitting   How long can you sit comfortably? 5-10 minutes.   Patient Stated Goals Get out of pain.   Currently in Pain? Yes   Pain Score 3    Pain Location Back   Pain Orientation Upper;Mid   Pain Descriptors / Indicators Aching;Throbbing   Pain Type Chronic pain   Pain Onset More than a month ago            [facility] PT Assessment - 09/07/16 0001      Assessment   Medical Diagnosis Neck, thoracic and low back pain.     Precautions   Precautions Fall     Restrictions   Weight Bearing Restrictions No                     [facility] Adult PT Treatment/Exercise - 09/07/16 0001      Modalities   Modalities Cryotherapy;Electrical Stimulation;Ultrasound     Cryotherapy   Number Minutes Cryotherapy 15 Minutes   Cryotherapy Location Shoulder;Cervical   Type of Cryotherapy Ice pack     Electrical Stimulation   Electrical Stimulation Location B cervical paraspinals/ UT, thoracic paraspinals   Electrical Stimulation Action IFC   Electrical Stimulation Parameters 80-150 hz x15 min   Electrical Stimulation Goals Pain;Tone     Ultrasound   Ultrasound Location B UT   Ultrasound Parameters 1.5 w/cm2, 100%, 1 mhz x10 min   Ultrasound Goals Pain     Manual Therapy   Manual Therapy Soft tissue mobilization   Soft tissue mobilization STW to B UT, Levator Scapula, cervical paraspinals as well thoracic paraspinals to reduce pain and tone                   PT Short Term Goals - 08/24/16 1157  PT SHORT TERM GOAL #1   Title Independent with an initial HEP.   Time 2   Period Weeks   Status On-going     PT SHORT TERM GOAL #2   Title Active bilateral cervical rotation= 60 degrees.   Time 2   Period Weeks   Status On-going           PT Long Term Goals - 08/22/16 1113      PT LONG TERM GOAL #1   Title Independent with an advanced HEP.   Time 8   Period Weeks   Status New     PT LONG TERM GOAL #2   Title Increase left hip abduction strength to a solid 4+/5 in order to eliminate her Trendelenburg gait pattern.   Time 8   Period Weeks   Status New     PT LONG TERM GOAL #3   Title Sit 20 minutes with pain not > 3/10.   Time 8   Period Weeks   Status New     PT LONG TERM GOAL #4   Title Eliminate left LE pain.     Time 8   Period Weeks   Status New     PT LONG TERM GOAL #5   Title Perform ADL's with pain not > 3/10.               Plan - 09/07/16 0900    Clinical  Impression Statement Patient continues to present with increased cervical and thoracic pain especially today. Pain predominately in posterior cervical musculature today such paraspinals and suboccipial region. Patient continues to experience discomfort around L thoracic paraspinals scar as well. Soreness reported by patient throughout manual therapy to region of B UT as it ascends into cervical region. Normal modalities response noted following removal of the modalities.   Rehab Potential Good   PT Frequency 2x / week   PT Duration 8 weeks   PT Treatment/Interventions ADLs/Self Care Home Management;Cryotherapy;Electrical Stimulation;Moist Heat;Ultrasound;Therapeutic activities;Therapeutic exercise;Patient/family education;Manual techniques;Passive range of motion;Dry needling   PT Next Visit Plan cont with POC per MPT and per patient tolerance for Modalities; STW/M; Dry needling; active cervical and lumbar range of motion and progression into core exercises.   Consulted and Agree with Plan of Care Patient      Patient will benefit from skilled therapeutic intervention in order to improve the following deficits and impairments:  Pain, Decreased activity tolerance, Decreased range of motion, Decreased strength  Visit Diagnosis: Cervicalgia  Abnormal posture  Thoracic root disorders, not elsewhere classified  Chronic bilateral low back pain, with sciatica presence unspecified     Problem List Patient Active Problem List   Diagnosis Date Noted  . Osteoporosis with pathological fracture, with routine healing, subsequent encounter 08/30/2016  . MDD (major depressive disorder), recurrent episode, severe ([place]) 03/29/2016  . Estrogen deficiency 12/15/2015  . MS (multiple sclerosis) ([place]) 12/15/2015  . Tremor 12/15/2015  . Healthcare maintenance 12/15/2015  . HTN (hypertension)   . Protein-calorie malnutrition, severe 11/09/2015  . Closed left hip fracture ([place]) 11/08/2015  . Bipolar 1  disorder, depressed ([place]) 02/11/2015    [name], PTA 09/07/2016, 9:22 AM  [facility] [address] [place], [place], [address] Phone: [phone]   Fax:  [phone]  Name: Angel Maddox MRN: [id number] Date of Birth: 1956/01/18

## 2016-09-09 ENCOUNTER — Encounter: Payer: Self-pay | Admitting: Physical Therapy

## 2016-09-09 ENCOUNTER — Ambulatory Visit: Payer: Medicare Other | Admitting: Physical Therapy

## 2016-09-09 DIAGNOSIS — G8929 Other chronic pain: Secondary | ICD-10-CM

## 2016-09-09 DIAGNOSIS — R293 Abnormal posture: Secondary | ICD-10-CM

## 2016-09-09 DIAGNOSIS — M542 Cervicalgia: Secondary | ICD-10-CM

## 2016-09-09 DIAGNOSIS — M545 Low back pain: Secondary | ICD-10-CM

## 2016-09-09 DIAGNOSIS — G543 Thoracic root disorders, not elsewhere classified: Secondary | ICD-10-CM | POA: Diagnosis not present

## 2016-09-09 NOTE — Therapy (Signed)
[facility] [place], [place], [address] Phone: [phone]   Fax:  [phone]  Physical Therapy Treatment  Patient Details  Name: Angel Maddox MRN: [id number] Date of Birth: May 31, 1956 Referring Provider: Rod Can MD  Encounter Date: 09/09/2016      PT End of Session - 09/09/16 0820    Visit Number 8   Number of Visits 16   Date for PT Re-Evaluation 10/21/16   PT Start Time 0820   PT Stop Time 0909   PT Time Calculation (min) 49 min   Activity Tolerance Patient tolerated treatment well   Behavior During Therapy [facility] for tasks assessed/performed      Past Medical History:  Diagnosis Date  . Anxiety   . Bipolar 1 disorder ([place])   . Chronic kidney disease    kidney stone  . Depression   . H/O hiatal hernia   . Hip fracture ([place]) 2017   left  . Hypertension   . Multiple sclerosis ([place])     Had for 15 years  . Tremors of nervous system     Past Surgical History:  Procedure Laterality Date  . HIP PINNING,CANNULATED Left 11/09/2015   Procedure: CANNULATED HIP PINNING;  Surgeon: Rod Can, MD;  Location: [facility];  Service: Orthopedics;  Laterality: Left;  . LIPOMA EXCISION    . TUBAL LIGATION      There were no vitals filed for this visit.      Subjective Assessment - 09/09/16 0815    Subjective Reports that back neck and back were hurting yesterday and states that today her legs feel weak.   Limitations Sitting   How long can you sit comfortably? 5-10 minutes.   Patient Stated Goals Get out of pain.   Currently in Pain? Yes   Pain Score 4    Pain Location Neck   Pain Orientation Right;Left   Pain Descriptors / Indicators Aching;Other (Comment)  Stiffness   Pain Type Chronic pain   Pain Onset More than a month ago            [facility] PT Assessment - 09/09/16 0001      Assessment   Medical Diagnosis Neck, thoracic and low back pain.     Precautions   Precautions Fall     Restrictions   Weight Bearing Restrictions No                     [facility] Adult PT Treatment/Exercise - 09/09/16 0001      Modalities   Modalities Electrical Stimulation;Cryotherapy     Cryotherapy   Number Minutes Cryotherapy 15 Minutes   Cryotherapy Location Shoulder;Cervical   Type of Cryotherapy Ice pack     Electrical Stimulation   Electrical Stimulation Location B cervical paraspinals/ UT, thoracic paraspinals   Electrical Stimulation Action IFC   Electrical Stimulation Parameters 80-150 hz x15 min   Electrical Stimulation Goals Pain;Tone     Manual Therapy   Manual Therapy Soft tissue mobilization   Soft tissue mobilization STW to B UT, Levator Scapula, cervical paraspinals as well thoracic paraspinals to reduce pain and tone                   PT Short Term Goals - 08/24/16 1157      PT SHORT TERM GOAL #1   Title Independent with an initial HEP.   Time 2   Period Weeks   Status On-going     PT SHORT TERM  GOAL #2   Title Active bilateral cervical rotation= 60 degrees.   Time 2   Period Weeks   Status On-going           PT Long Term Goals - 08/22/16 1113      PT LONG TERM GOAL #1   Title Independent with an advanced HEP.   Time 8   Period Weeks   Status New     PT LONG TERM GOAL #2   Title Increase left hip abduction strength to a solid 4+/5 in order to eliminate her Trendelenburg gait pattern.   Time 8   Period Weeks   Status New     PT LONG TERM GOAL #3   Title Sit 20 minutes with pain not > 3/10.   Time 8   Period Weeks   Status New     PT LONG TERM GOAL #4   Title Eliminate left LE pain.     Time 8   Period Weeks   Status New     PT LONG TERM GOAL #5   Title Perform ADL's with pain not > 3/10.               Plan - 09/09/16 0908    Clinical Impression Statement Patient tolerated today's treatment fairly well as she continues to present with greater cervical and thoracic pain than lumbar pain. Patient did  report soreness to manual therapy during manual therapy to cervical and thoracic musculaure. Patient requested manual therapy to vertical scar along thoracic paraspinals and pain was presenting there as well today. Normal modalities response noted following removal of the modalities.   Rehab Potential Good   PT Frequency 2x / week   PT Duration 8 weeks   PT Treatment/Interventions ADLs/Self Care Home Management;Cryotherapy;Electrical Stimulation;Moist Heat;Ultrasound;Therapeutic activities;Therapeutic exercise;Patient/family education;Manual techniques;Passive range of motion;Dry needling   PT Next Visit Plan cont with POC per MPT and per patient tolerance for Modalities; STW/M; Dry needling; active cervical and lumbar range of motion and progression into core exercises.   Consulted and Agree with Plan of Care Patient      Patient will benefit from skilled therapeutic intervention in order to improve the following deficits and impairments:  Pain, Decreased activity tolerance, Decreased range of motion, Decreased strength  Visit Diagnosis: Cervicalgia  Abnormal posture  Thoracic root disorders, not elsewhere classified  Chronic bilateral low back pain, with sciatica presence unspecified     Problem List Patient Active Problem List   Diagnosis Date Noted  . Osteoporosis with pathological fracture, with routine healing, subsequent encounter 08/30/2016  . MDD (major depressive disorder), recurrent episode, severe ([place]) 03/29/2016  . Estrogen deficiency 12/15/2015  . MS (multiple sclerosis) ([place]) 12/15/2015  . Tremor 12/15/2015  . Healthcare maintenance 12/15/2015  . HTN (hypertension)   . Protein-calorie malnutrition, severe 11/09/2015  . Closed left hip fracture ([place]) 11/08/2015  . Bipolar 1 disorder, depressed ([place]) 02/11/2015    [name], PTA 09/09/2016, 9:23 AM  [facility] [place], [place], [address] Phone:  [phone]   Fax:  [phone]  Name: KARTHIKA GLASPER MRN: [id number] Date of Birth: [birth year]

## 2016-09-12 ENCOUNTER — Ambulatory Visit: Payer: Medicare Other | Admitting: Physical Therapy

## 2016-09-12 DIAGNOSIS — F411 Generalized anxiety disorder: Secondary | ICD-10-CM | POA: Diagnosis not present

## 2016-09-12 DIAGNOSIS — R293 Abnormal posture: Secondary | ICD-10-CM | POA: Diagnosis not present

## 2016-09-12 DIAGNOSIS — G8929 Other chronic pain: Secondary | ICD-10-CM | POA: Diagnosis not present

## 2016-09-12 DIAGNOSIS — M542 Cervicalgia: Secondary | ICD-10-CM

## 2016-09-12 DIAGNOSIS — M545 Low back pain: Secondary | ICD-10-CM | POA: Diagnosis not present

## 2016-09-12 DIAGNOSIS — G543 Thoracic root disorders, not elsewhere classified: Secondary | ICD-10-CM | POA: Diagnosis not present

## 2016-09-12 NOTE — Therapy (Signed)
[facility] [place], [place], [address] Phone: [phone]   Fax:  [phone]  Physical Therapy Treatment  Patient Details  Name: Angel Maddox MRN: [id number] Date of Birth: 1956/04/06 Referring Provider: Rod Can MD  Encounter Date: 09/12/2016      PT End of Session - 09/12/16 0820    Visit Number 9   Number of Visits 16   Date for PT Re-Evaluation 10/21/16   PT Start Time 0820   PT Stop Time 0915   PT Time Calculation (min) 55 min   Activity Tolerance Patient tolerated treatment well   Behavior During Therapy [facility] for tasks assessed/performed      Past Medical History:  Diagnosis Date  . Anxiety   . Bipolar 1 disorder ([place])   . Chronic kidney disease    kidney stone  . Depression   . H/O hiatal hernia   . Hip fracture ([place]) 2017   left  . Hypertension   . Multiple sclerosis ([place])     Had for 15 years  . Tremors of nervous system     Past Surgical History:  Procedure Laterality Date  . HIP PINNING,CANNULATED Left 11/09/2015   Procedure: CANNULATED HIP PINNING;  Surgeon: Rod Can, MD;  Location: [facility];  Service: Orthopedics;  Laterality: Left;  . LIPOMA EXCISION    . TUBAL LIGATION      There were no vitals filed for this visit.      Subjective Assessment - 09/12/16 0820    Subjective This is the first morning in I can't remember how long that I haven't had pain. She states she awoke with pain in her neck and back but after she walked around it went away.   Limitations Sitting   Patient Stated Goals Get out of pain.   Currently in Pain? No/denies            [facility] PT Assessment - 09/12/16 0001      Assessment   Medical Diagnosis Neck, thoracic and low back pain.     Posture/Postural Control   Posture Comments Stands with head in RSB; has tight R QL, even scapula; stands in left lateral shift                     [facility] Adult PT Treatment/Exercise - 09/12/16  0001      Self-Care   Self-Care Other Self-Care Comments   Other Self-Care Comments  Dry needling education and aftercare     Modalities   Modalities Electrical Stimulation;Moist Heat     Moist Heat Therapy   Number Minutes Moist Heat 15 Minutes   Moist Heat Location Cervical;Lumbar Spine     Electrical Stimulation   Electrical Stimulation Location B cervical and lumbar paraspinals   Electrical Stimulation Action IFC   Electrical Stimulation Parameters 80-150 Hz x 41mn   Electrical Stimulation Goals Pain     Manual Therapy   Manual Therapy Soft tissue mobilization   Soft tissue mobilization to B UT/lev scap/ cervical and lumbar paraspinals and gluteals          Trigger Point Dry Needling - 09/12/16 1138    Consent Given? Yes   Education Handout Provided Yes   Muscles Treated Upper Body Upper trapezius;Levator scapulae;Longissimus;Quadratus Lumborum  B   Muscles Treated Lower Body Gluteus minimus;Gluteus maximus  B   Upper Trapezius Response Twitch reponse elicited;Palpable increased muscle length   Levator Scapulae Response Twitch response elicited;Palpable increased muscle length  Longissimus Response Twitch response elicited;Palpable increased muscle length   Gluteus Maximus Response Twitch response elicited;Palpable increased muscle length   Gluteus Minimus Response Twitch response elicited;Palpable increased muscle length              PT Education - 09/12/16 1139    Education provided Yes   Education Details see self care   Person(s) Educated Patient   Methods Explanation;Demonstration;Handout   Comprehension Verbalized understanding          PT Short Term Goals - 08/24/16 1157      PT SHORT TERM GOAL #1   Title Independent with an initial HEP.   Time 2   Period Weeks   Status Achieved     PT SHORT TERM GOAL #2   Title Active bilateral cervical rotation= 60 degrees.   Time 2   Period Weeks   Status On-going           PT Long Term  Goals - 09/12/16 7564      PT LONG TERM GOAL #1   Title Independent with an advanced HEP.   Time 8   Period Weeks   Status New     PT LONG TERM GOAL #2   Title Increase left hip abduction strength to a solid 4+/5 in order to eliminate her Trendelenburg gait pattern.   Baseline left hip ABD 4/5 as of 09/12/16   Time 8   Period Weeks   Status New     PT LONG TERM GOAL #3   Title Sit 20 minutes with pain not > 3/10.   Baseline at 30 min pain is up to 5-6/10   Time 8   Period Weeks   Status New     PT LONG TERM GOAL #4   Title Eliminate left LE pain.     Baseline 70% better as of 09/12/16   Time 8   Period Weeks   Status Partially Met     PT LONG TERM GOAL #5   Title Perform ADL's with pain not > 3/10.   Baseline up to 10/10 as of 09/12/16   Period Days   Status New               Plan - 09/12/16 1140    Clinical Impression Statement Patient presented today with reports of no pain although she complained of pain with bed mobility during treatment. She has made improvements with sitting tolerance and reports 70% improvement with LLE symptoms, however pain with ADLS still climbs to 33/29 for patient.  She has significant postural deficits including a left lateral shift and she stands in R cervical SB. She tolerated TPDN well with ++ twitch responses elicited in her neck and back. This was her first session of needling. Her LTGs are ongoing as is her cervical rotation goal.   Rehab Potential Good   PT Frequency 2x / week   PT Duration 8 weeks   PT Treatment/Interventions ADLs/Self Care Home Management;Cryotherapy;Electrical Stimulation;Moist Heat;Ultrasound;Therapeutic activities;Therapeutic exercise;Patient/family education;Manual techniques;Passive range of motion;Dry needling   PT Next Visit Plan Assess DN and continue as indicated, correct left lateral shift and right cervical SB, progress to core and upper back strengthening as tolerated. Modalities prn.   Consulted and  Agree with Plan of Care Patient      Patient will benefit from skilled therapeutic intervention in order to improve the following deficits and impairments:  Pain, Decreased activity tolerance, Decreased range of motion, Decreased strength  Visit Diagnosis: Cervicalgia  Abnormal posture  Chronic bilateral low back pain, with sciatica presence unspecified     Problem List Patient Active Problem List   Diagnosis Date Noted  . Osteoporosis with pathological fracture, with routine healing, subsequent encounter 08/30/2016  . MDD (major depressive disorder), recurrent episode, severe ([place]) 03/29/2016  . Estrogen deficiency 12/15/2015  . MS (multiple sclerosis) ([place]) 12/15/2015  . Tremor 12/15/2015  . Healthcare maintenance 12/15/2015  . HTN (hypertension)   . Protein-calorie malnutrition, severe 11/09/2015  . Closed left hip fracture ([place]) 11/08/2015  . Bipolar 1 disorder, depressed ([place]) 02/11/2015    Madelyn Flavors PT 09/12/2016, 11:55 AM  [facility] [address] [place], [place], [address] Phone: [phone]   Fax:  [phone]  Name: Angel Maddox MRN: [id number] Date of Birth: [birth year]

## 2016-09-12 NOTE — Patient Instructions (Signed)
Trigger Point Dry Needling  . What is Trigger Point Dry Needling (DN)? o DN is a physical therapy technique used to treat muscle pain and dysfunction. Specifically, DN helps deactivate muscle trigger points (muscle knots).  o A thin filiform needle is used to penetrate the skin and stimulate the underlying trigger point. The goal is for a local twitch response (LTR) to occur and for the trigger point to relax. No medication of any kind is injected during the procedure.   . What Does Trigger Point Dry Needling Feel Like?  o The procedure feels different for each individual patient. Some patients report that they do not actually feel the needle enter the skin and overall the process is not painful. Very mild bleeding may occur. However, many patients feel a deep cramping in the muscle in which the needle was inserted. This is the local twitch response.   Marland Kitchen How Will I feel after the treatment? o Soreness is normal, and the onset of soreness may not occur for a few hours. Typically this soreness does not last longer than two days.  o Bruising is uncommon, however; ice can be used to decrease any possible bruising.  o In rare cases feeling tired or nauseous after the treatment is normal. In addition, your symptoms may get worse before they get better, this period will typically not last longer than 24 hours.   . What Can I do After My Treatment? o Increase your hydration by drinking more water for the next 24 hours. o You may place ice or heat on the areas treated that have become sore, however, do not use heat on inflamed or bruised areas. Heat often brings more relief post needling. o You can continue your regular activities, but vigorous activity is not recommended initially after the treatment for 24 hours. o DN is best combined with other physical therapy such as strengthening, stretching, and other therapies.    Precautions:  In some cases, dry needling is done over the lung field. While rare,  there is a risk of pneumothorax (punctured lung). Because of this, if you ever experience shortness of breath on exertion, difficulty taking a deep breath, chest pain or a dry cough following dry needling, you should report to an emergency room and tell them that you have been dry needled over the thorax.  Madelyn Flavors, PT 09/12/16 8:26 AM [facility] [address] [place], [place], [address] Phone: [phone]   Fax:  [phone]

## 2016-09-14 ENCOUNTER — Encounter: Payer: Self-pay | Admitting: Physical Therapy

## 2016-09-14 ENCOUNTER — Ambulatory Visit: Payer: Medicare Other | Admitting: Physical Therapy

## 2016-09-14 DIAGNOSIS — M542 Cervicalgia: Secondary | ICD-10-CM

## 2016-09-14 DIAGNOSIS — R293 Abnormal posture: Secondary | ICD-10-CM | POA: Diagnosis not present

## 2016-09-14 DIAGNOSIS — M545 Low back pain: Secondary | ICD-10-CM

## 2016-09-14 DIAGNOSIS — G8929 Other chronic pain: Secondary | ICD-10-CM

## 2016-09-14 DIAGNOSIS — G543 Thoracic root disorders, not elsewhere classified: Secondary | ICD-10-CM | POA: Diagnosis not present

## 2016-09-14 NOTE — Therapy (Signed)
[facility] [place], [place], [address] Phone: [phone]   Fax:  [phone]  Physical Therapy Treatment  Patient Details  Name: Angel Maddox MRN: [id number] Date of Birth: 1955/09/02 Referring Provider: Suella Broad, MD  Encounter Date: 09/14/2016      PT End of Session - 09/14/16 0902    Visit Number 10   Number of Visits 16   Date for PT Re-Evaluation 10/21/16   PT Start Time 0901   PT Stop Time 0952   PT Time Calculation (min) 51 min   Activity Tolerance Patient tolerated treatment well   Behavior During Therapy [facility] for tasks assessed/performed      Past Medical History:  Diagnosis Date  . Anxiety   . Bipolar 1 disorder ([place])   . Chronic kidney disease    kidney stone  . Depression   . H/O hiatal hernia   . Hip fracture ([place]) 2017   left  . Hypertension   . Multiple sclerosis ([place])     Had for 15 years  . Tremors of nervous system     Past Surgical History:  Procedure Laterality Date  . HIP PINNING,CANNULATED Left 11/09/2015   Procedure: CANNULATED HIP PINNING;  Surgeon: Rod Can, MD;  Location: [facility];  Service: Orthopedics;  Laterality: Left;  . LIPOMA EXCISION    . TUBAL LIGATION      There were no vitals filed for this visit.      Subjective Assessment - 09/14/16 0902    Subjective States that she got bad results from MRI and sees Dr. Rolena Infante tomorrow. States that she woke up in excruciating pain around 3:30 am.   Limitations Sitting   How long can you sit comfortably? 5-10 minutes.   Patient Stated Goals Get out of pain.   Currently in Pain? Yes   Pain Score 6    Pain Location Neck   Pain Orientation Right;Left   Pain Descriptors / Indicators Tightness;Aching;Throbbing   Pain Type Chronic pain   Pain Onset More than a month ago   Pain Score 4   Pain Location Back   Pain Orientation Lower   Pain Type Chronic pain   Pain Onset More than a month ago            [facility]  PT Assessment - 09/14/16 0001      Assessment   Medical Diagnosis Neck, thoracic and low back pain.     Precautions   Precautions Fall     Restrictions   Weight Bearing Restrictions No                     [facility] Adult PT Treatment/Exercise - 09/14/16 0001      Modalities   Modalities Electrical Stimulation;Cryotherapy     Cryotherapy   Number Minutes Cryotherapy 15 Minutes   Cryotherapy Location Cervical;Lumbar Spine   Type of Cryotherapy Ice pack     Electrical Stimulation   Electrical Stimulation Location B cervical and thoracic paraspinals   Electrical Stimulation Action IFC   Electrical Stimulation Parameters 1-10 hz x15 min   Electrical Stimulation Goals Pain;Tone     Manual Therapy   Manual Therapy Soft tissue mobilization   Soft tissue mobilization STW to B UT, thoracic paraspinals, cervical paraspinals to reduce tone and pain                  PT Short Term Goals - 09/12/16 1200      PT SHORT  TERM GOAL #1   Title Independent with an initial HEP.   Time 2   Period Weeks   Status Achieved     PT SHORT TERM GOAL #2   Title Active bilateral cervical rotation= 60 degrees.   Time 2   Period Weeks   Status New           PT Long Term Goals - 09/12/16 2725      PT LONG TERM GOAL #1   Title Independent with an advanced HEP.   Time 8   Period Weeks   Status New     PT LONG TERM GOAL #2   Title Increase left hip abduction strength to a solid 4+/5 in order to eliminate her Trendelenburg gait pattern.   Baseline left hip ABD 4/5 as of 09/12/16   Time 8   Period Weeks   Status New     PT LONG TERM GOAL #3   Title Sit 20 minutes with pain not > 3/10.   Baseline at 30 min pain is up to 5-6/10   Time 8   Period Weeks   Status New     PT LONG TERM GOAL #4   Title Eliminate left LE pain.     Baseline 70% better as of 09/12/16   Time 8   Period Weeks   Status Partially Met     PT LONG TERM GOAL #5   Title Perform ADL's with pain  not > 3/10.   Baseline up to 10/10 as of 09/12/16   Period Days   Status New               Plan - 09/14/16 0949    Clinical Impression Statement Patient continues to present with more cervical pain than lumbar pain. Reports that she used TENS and ice throughout the early morning after she woke in excruciating pain. Tightness present throughout B UT, cervical paraspinals but no abnormal tightness down into thoracic paraspinals in which patient also had pain. Patient requested more cervical treatment today. Normal modalites response noted following removal of the modalities.    Rehab Potential Good   PT Frequency 2x / week   PT Duration 8 weeks   PT Treatment/Interventions ADLs/Self Care Home Management;Cryotherapy;Electrical Stimulation;Moist Heat;Ultrasound;Therapeutic activities;Therapeutic exercise;Patient/family education;Manual techniques;Passive range of motion;Dry needling   PT Next Visit Plan Continue manual therapy and modalities as well as try scapular strengthening per MPT POC.   Consulted and Agree with Plan of Care Patient      Patient will benefit from skilled therapeutic intervention in order to improve the following deficits and impairments:  Pain, Decreased activity tolerance, Decreased range of motion, Decreased strength  Visit Diagnosis: Cervicalgia  Abnormal posture  Chronic bilateral low back pain, with sciatica presence unspecified  Thoracic root disorders, not elsewhere classified     Problem List Patient Active Problem List   Diagnosis Date Noted  . Osteoporosis with pathological fracture, with routine healing, subsequent encounter 08/30/2016  . MDD (major depressive disorder), recurrent episode, severe ([place]) 03/29/2016  . Estrogen deficiency 12/15/2015  . MS (multiple sclerosis) ([place]) 12/15/2015  . Tremor 12/15/2015  . Healthcare maintenance 12/15/2015  . HTN (hypertension)   . Protein-calorie malnutrition, severe 11/09/2015  . Closed left hip  fracture ([place]) 11/08/2015  . Bipolar 1 disorder, depressed ([place]) 02/11/2015    [name], PTA 09/14/2016, 10:01 AM  [facility] [address] [place], [place], [address] Phone: [phone]   Fax:  [phone]  Name:  Angel Maddox MRN: [id number] Date of Birth: 1955/11/21

## 2016-09-15 DIAGNOSIS — G8929 Other chronic pain: Secondary | ICD-10-CM | POA: Diagnosis not present

## 2016-09-15 DIAGNOSIS — M542 Cervicalgia: Secondary | ICD-10-CM | POA: Diagnosis not present

## 2016-09-15 DIAGNOSIS — M545 Low back pain: Secondary | ICD-10-CM | POA: Diagnosis not present

## 2016-09-16 ENCOUNTER — Ambulatory Visit: Payer: Medicare Other | Admitting: Physical Therapy

## 2016-09-16 ENCOUNTER — Encounter: Payer: Self-pay | Admitting: Physical Therapy

## 2016-09-16 DIAGNOSIS — G8929 Other chronic pain: Secondary | ICD-10-CM

## 2016-09-16 DIAGNOSIS — R293 Abnormal posture: Secondary | ICD-10-CM | POA: Diagnosis not present

## 2016-09-16 DIAGNOSIS — M542 Cervicalgia: Secondary | ICD-10-CM | POA: Diagnosis not present

## 2016-09-16 DIAGNOSIS — M545 Low back pain: Secondary | ICD-10-CM | POA: Diagnosis not present

## 2016-09-16 DIAGNOSIS — G543 Thoracic root disorders, not elsewhere classified: Secondary | ICD-10-CM

## 2016-09-16 NOTE — Therapy (Signed)
[facility] [place], [place], [address] Phone: [phone]   Fax:  [phone]  Physical Therapy Treatment  Patient Details  Name: Angel Maddox MRN: [id number] Date of Birth: 08-13-1955 Referring Provider: Suella Broad, MD  Encounter Date: 09/16/2016      PT End of Session - 09/16/16 0829    Visit Number 11   Number of Visits 16   Date for PT Re-Evaluation 10/21/16   PT Start Time 0824   PT Stop Time 0915   PT Time Calculation (min) 51 min   Activity Tolerance Patient tolerated treatment well   Behavior During Therapy [facility] for tasks assessed/performed      Past Medical History:  Diagnosis Date  . Anxiety   . Bipolar 1 disorder ([place])   . Chronic kidney disease    kidney stone  . Depression   . H/O hiatal hernia   . Hip fracture ([place]) 2017   left  . Hypertension   . Multiple sclerosis ([place])     Had for 15 years  . Tremors of nervous system     Past Surgical History:  Procedure Laterality Date  . HIP PINNING,CANNULATED Left 11/09/2015   Procedure: CANNULATED HIP PINNING;  Surgeon: Rod Can, MD;  Location: [facility];  Service: Orthopedics;  Laterality: Left;  . LIPOMA EXCISION    . TUBAL LIGATION      There were no vitals filed for this visit.      Subjective Assessment - 09/16/16 0828    Subjective Reports that Dr. Rolena Infante said that manual therapy wasn't doing any good. Patient requests to start postural strengthening and to continue DN until injections begin in her back.   Limitations Sitting   How long can you sit comfortably? 5-10 minutes.   Patient Stated Goals Get out of pain.   Currently in Pain? Other (Comment)  Reports no real pain in cervcial or lumbar spine only stiffness upon waking            [facility] PT Assessment - 09/16/16 0001      Assessment   Medical Diagnosis Neck, thoracic and low back pain.     Precautions   Precautions Fall     Restrictions   Weight Bearing  Restrictions No                     [facility] Adult PT Treatment/Exercise - 09/16/16 0001      Exercises   Exercises Shoulder;Lumbar     Shoulder Exercises: Seated   Other Seated Exercises B shoulder elevation x20 reps 3 sec hold     Shoulder Exercises: Standing   External Rotation Strengthening;Both;20 reps;Theraband   Theraband Level (Shoulder External Rotation) Level 1 (Yellow)     Shoulder Exercises: ROM/Strengthening   UBE (Upper Arm Bike) 120 RPM x5 min   "W" Arms x20 reps   Other ROM/Strengthening Exercises 909/90 ER with scapular squeeze x20 reps     Modalities   Modalities Electrical Stimulation;Cryotherapy     Cryotherapy   Number Minutes Cryotherapy 15 Minutes   Cryotherapy Location Cervical;Lumbar Spine   Type of Cryotherapy Ice pack     Electrical Stimulation   Electrical Stimulation Location B cervical and thoracic paraspinals   Electrical Stimulation Action IFC   Electrical Stimulation Parameters 1-10 hz x15 min   Electrical Stimulation Goals Pain;Tone                  PT Short Term Goals - 09/12/16 1200  PT SHORT TERM GOAL #1   Title Independent with an initial HEP.   Time 2   Period Weeks   Status Achieved     PT SHORT TERM GOAL #2   Title Active bilateral cervical rotation= 60 degrees.   Time 2   Period Weeks   Status New           PT Long Term Goals - 09/12/16 2130      PT LONG TERM GOAL #1   Title Independent with an advanced HEP.   Time 8   Period Weeks   Status New     PT LONG TERM GOAL #2   Title Increase left hip abduction strength to a solid 4+/5 in order to eliminate her Trendelenburg gait pattern.   Baseline left hip ABD 4/5 as of 09/12/16   Time 8   Period Weeks   Status New     PT LONG TERM GOAL #3   Title Sit 20 minutes with pain not > 3/10.   Baseline at 30 min pain is up to 5-6/10   Time 8   Period Weeks   Status New     PT LONG TERM GOAL #4   Title Eliminate left LE pain.     Baseline  70% better as of 09/12/16   Time 8   Period Weeks   Status Partially Met     PT LONG TERM GOAL #5   Title Perform ADL's with pain not > 3/10.   Baseline up to 10/10 as of 09/12/16   Period Days   Status New               Plan - 09/16/16 0906    Clinical Impression Statement Patient presented in clinic with requests to focus on postural strengthening more than manual therapy as no relief is noticed. Patient taken through gentle postural strengthening with education to montior pain with exercises as to not exaggerate pain. Patient only reported discomfort beginning as standing ER with yellow theraband progressed. Patient provided HEP with education regarding technique and parameters with patient verbalizing understanding. Normal modalities response noted following removal of the modalities.    Rehab Potential Good   PT Frequency 2x / week   PT Duration 8 weeks   PT Treatment/Interventions ADLs/Self Care Home Management;Cryotherapy;Electrical Stimulation;Moist Heat;Ultrasound;Therapeutic activities;Therapeutic exercise;Patient/family education;Manual techniques;Passive range of motion;Dry needling   PT Next Visit Plan Continue gentle postural strengthening with dry needling in next treatments.   Consulted and Agree with Plan of Care Patient      Patient will benefit from skilled therapeutic intervention in order to improve the following deficits and impairments:  Pain, Decreased activity tolerance, Decreased range of motion, Decreased strength  Visit Diagnosis: Cervicalgia  Abnormal posture  Chronic bilateral low back pain, with sciatica presence unspecified  Thoracic root disorders, not elsewhere classified     Problem List Patient Active Problem List   Diagnosis Date Noted  . Osteoporosis with pathological fracture, with routine healing, subsequent encounter 08/30/2016  . MDD (major depressive disorder), recurrent episode, severe ([place]) 03/29/2016  . Estrogen deficiency  12/15/2015  . MS (multiple sclerosis) ([place]) 12/15/2015  . Tremor 12/15/2015  . Healthcare maintenance 12/15/2015  . HTN (hypertension)   . Protein-calorie malnutrition, severe 11/09/2015  . Closed left hip fracture ([place]) 11/08/2015  . Bipolar 1 disorder, depressed ([place]) 02/11/2015    [name], PTA 09/16/2016, 9:22 AM  [facility] [address] [place], [place], [address] Phone: [phone]  Fax:  [phone]  Name: EDITHE DOBBIN MRN: [id number] Date of Birth: [birth year]

## 2016-09-16 NOTE — Patient Instructions (Addendum)
Strengthening: Shoulder Shrug (Phase 1)    Shrug shoulders up and down then forward and backward. Repeat __10__ times per set. Do _2___ sets per session. Do __2-3__ sessions per day.  http://orth.exer.us/336   Copyright  VHI. All rights reserved.  Scapular Retraction: Elbow Flexion (Standing)    With elbows bent to 90, pinch shoulder blades together and rotate arms out, keeping elbows bent. Repeat __10__ times per set. Do __2__ sets per session. Do __2-3__ sessions per day.  http://orth.exer.us/948   Copyright  VHI. All rights reserved.  Resisted External Rotation: Abduction - Bilateral     Keep arms elevated and elbows bent to 90, forearms forward. Pinch shoulder blades together and rotate forearms up. Repeat _10___ times per set. Do __2__ sets per session. Do _2-3___ sessions per day.  http://orth.exer.us/962   Copyright  VHI. All rights reserved.

## 2016-09-19 ENCOUNTER — Ambulatory Visit: Payer: Medicare Other | Admitting: Physical Therapy

## 2016-09-19 ENCOUNTER — Encounter: Payer: Self-pay | Admitting: Physical Therapy

## 2016-09-19 DIAGNOSIS — M542 Cervicalgia: Secondary | ICD-10-CM

## 2016-09-19 DIAGNOSIS — G543 Thoracic root disorders, not elsewhere classified: Secondary | ICD-10-CM

## 2016-09-19 DIAGNOSIS — G8929 Other chronic pain: Secondary | ICD-10-CM | POA: Diagnosis not present

## 2016-09-19 DIAGNOSIS — R293 Abnormal posture: Secondary | ICD-10-CM | POA: Diagnosis not present

## 2016-09-19 DIAGNOSIS — M545 Low back pain: Secondary | ICD-10-CM

## 2016-09-19 NOTE — Therapy (Signed)
[facility] [place], [place], [address] Phone: [phone]   Fax:  [phone]  Physical Therapy Treatment  Patient Details  Name: Angel Maddox MRN: [id number] Date of Birth: [birth year] Referring Provider: Suella Broad, MD  Encounter Date: 09/19/2016      PT End of Session - 09/19/16 0904    Visit Number 12   Number of Visits 16   Date for PT Re-Evaluation 10/21/16   PT Start Time 0904   PT Stop Time 1000   PT Time Calculation (min) 56 min   Activity Tolerance Patient tolerated treatment well   Behavior During Therapy [facility] for tasks assessed/performed      Past Medical History:  Diagnosis Date  . Anxiety   . Bipolar 1 disorder ([place])   . Chronic kidney disease    kidney stone  . Depression   . H/O hiatal hernia   . Hip fracture ([place]) 2017   left  . Hypertension   . Multiple sclerosis ([place])     Had for 15 years  . Tremors of nervous system     Past Surgical History:  Procedure Laterality Date  . HIP PINNING,CANNULATED Left 11/09/2015   Procedure: CANNULATED HIP PINNING;  Surgeon: Rod Can, MD;  Location: [facility];  Service: Orthopedics;  Laterality: Left;  . LIPOMA EXCISION    . TUBAL LIGATION      There were no vitals filed for this visit.      Subjective Assessment - 09/19/16 0904    Subjective Patient has consult with MD on 09/21/16. She reports her neck pain is a little better today.   Limitations Sitting   How long can you sit comfortably? 5-10 minutes.   Currently in Pain? Yes   Pain Score 4    Pain Location Neck   Pain Orientation Right;Left   Pain Descriptors / Indicators Tightness;Aching;Throbbing   Pain Type Chronic pain   Pain Onset More than a month ago   Pain Score 4   Pain Location Back   Pain Orientation Lower   Pain Descriptors / Indicators Aching;Headache   Pain Type Chronic pain   Pain Onset More than a month ago   Pain Frequency Constant   Aggravating Factors   certain movements   Pain Relieving Factors at rest                         [facility] Adult PT Treatment/Exercise - 09/19/16 0001      Lumbar Exercises: Standing   Other Standing Lumbar Exercises Yoga: seven position at sink for traction 2 reps     Modalities   Modalities Electrical Stimulation;Moist Heat     Moist Heat Therapy   Number Minutes Moist Heat 15 Minutes   Moist Heat Location Cervical;Lumbar Spine     Electrical Stimulation   Electrical Stimulation Location B cervical and thoracic paraspinals   Electrical Stimulation Action IFC   Electrical Stimulation Parameters 80-150 Hz x 15 min   Electrical Stimulation Goals Pain;Tone     Manual Therapy   Manual Therapy Soft tissue mobilization   Soft tissue mobilization STW to B UT, thoracic paraspinals, cervical paraspinals to reduce tone and pain          Trigger Point Dry Needling - 09/19/16 1027    Consent Given? Yes   Education Handout Provided No   Muscles Treated Upper Body Upper trapezius;Suboccipitals muscle group;Levator scapulae;Longissimus  thoracic multifidi T1-5 B   Upper Trapezius Response  Twitch reponse elicited;Palpable increased muscle length   SubOccipitals Response Twitch response elicited;Palpable increased muscle length   Levator Scapulae Response Twitch response elicited;Palpable increased muscle length   Longissimus Response Twitch response elicited;Palpable increased muscle length  multifidi              PT Education - 09/19/16 1209    Education provided Yes   Education Details HEP   Person(s) Educated Patient   Methods Explanation;Demonstration   Comprehension Verbalized understanding;Returned demonstration          PT Short Term Goals - 09/12/16 1200      PT SHORT TERM GOAL #1   Title Independent with an initial HEP.   Time 2   Period Weeks   Status Achieved     PT SHORT TERM GOAL #2   Title Active bilateral cervical rotation= 60 degrees.   Time 2   Period  Weeks   Status New           PT Long Term Goals - 09/12/16 2947      PT LONG TERM GOAL #1   Title Independent with an advanced HEP.   Time 8   Period Weeks   Status New     PT LONG TERM GOAL #2   Title Increase left hip abduction strength to a solid 4+/5 in order to eliminate her Trendelenburg gait pattern.   Baseline left hip ABD 4/5 as of 09/12/16   Time 8   Period Weeks   Status New     PT LONG TERM GOAL #3   Title Sit 20 minutes with pain not > 3/10.   Baseline at 30 min pain is up to 5-6/10   Time 8   Period Weeks   Status New     PT LONG TERM GOAL #4   Title Eliminate left LE pain.     Baseline 70% better as of 09/12/16   Time 8   Period Weeks   Status Partially Met     PT LONG TERM GOAL #5   Title Perform ADL's with pain not > 3/10.   Baseline up to 10/10 as of 09/12/16   Period Days   Status New               Plan - 09/19/16 1210    Clinical Impression Statement Patient tolerated dry needling fairly well today. She is hypersensitive in general, but reports decreased stiffness afterwards with cervical ROM. She had ++localized twitch responses in thoracic spine as well as B UT and suboccipitals. Patient would benefit from yoga to encourage movement and help decrease hypersensitivity and tightness of muscles, however yoga is limited in this area.    PT Treatment/Interventions ADLs/Self Care Home Management;Cryotherapy;Electrical Stimulation;Moist Heat;Ultrasound;Therapeutic activities;Therapeutic exercise;Patient/family education;Manual techniques;Passive range of motion;Dry needling   PT Next Visit Plan Try seated yoga poses with patient. Continue gentle postural strengthening with dry needling in next treatments.      Patient will benefit from skilled therapeutic intervention in order to improve the following deficits and impairments:  Pain, Decreased activity tolerance, Decreased range of motion, Decreased strength  Visit  Diagnosis: Cervicalgia  Chronic bilateral low back pain, with sciatica presence unspecified  Thoracic root disorders, not elsewhere classified     Problem List Patient Active Problem List   Diagnosis Date Noted  . Osteoporosis with pathological fracture, with routine healing, subsequent encounter 08/30/2016  . MDD (major depressive disorder), recurrent episode, severe ([place]) 03/29/2016  . Estrogen deficiency 12/15/2015  . MS (multiple  sclerosis) ([place]) 12/15/2015  . Tremor 12/15/2015  . Healthcare maintenance 12/15/2015  . HTN (hypertension)   . Protein-calorie malnutrition, severe 11/09/2015  . Closed left hip fracture ([place]) 11/08/2015  . Bipolar 1 disorder, depressed ([place]) 02/11/2015   Madelyn Flavors PT 09/19/2016, 12:24 PM  [facility] [address] [place], [place], [address] Phone: [phone]   Fax:  [phone]  Name: LISAANNE LAWRIE MRN: [id number] Date of Birth: 1955-09-17

## 2016-09-21 ENCOUNTER — Encounter: Payer: Self-pay | Admitting: Physical Therapy

## 2016-09-21 DIAGNOSIS — M542 Cervicalgia: Secondary | ICD-10-CM | POA: Diagnosis not present

## 2016-09-21 DIAGNOSIS — M5136 Other intervertebral disc degeneration, lumbar region: Secondary | ICD-10-CM | POA: Diagnosis not present

## 2016-09-21 DIAGNOSIS — M545 Low back pain: Secondary | ICD-10-CM | POA: Diagnosis not present

## 2016-09-21 DIAGNOSIS — G8929 Other chronic pain: Secondary | ICD-10-CM | POA: Diagnosis not present

## 2016-09-22 ENCOUNTER — Encounter: Payer: Self-pay | Admitting: Physical Therapy

## 2016-09-26 ENCOUNTER — Ambulatory Visit: Payer: Medicare Other | Attending: Physical Medicine and Rehabilitation | Admitting: Physical Therapy

## 2016-09-26 DIAGNOSIS — M545 Low back pain: Secondary | ICD-10-CM | POA: Diagnosis not present

## 2016-09-26 DIAGNOSIS — G543 Thoracic root disorders, not elsewhere classified: Secondary | ICD-10-CM | POA: Diagnosis not present

## 2016-09-26 DIAGNOSIS — F411 Generalized anxiety disorder: Secondary | ICD-10-CM | POA: Diagnosis not present

## 2016-09-26 DIAGNOSIS — M542 Cervicalgia: Secondary | ICD-10-CM

## 2016-09-26 DIAGNOSIS — M6281 Muscle weakness (generalized): Secondary | ICD-10-CM | POA: Diagnosis not present

## 2016-09-26 DIAGNOSIS — G8929 Other chronic pain: Secondary | ICD-10-CM

## 2016-09-26 DIAGNOSIS — R2689 Other abnormalities of gait and mobility: Secondary | ICD-10-CM | POA: Diagnosis not present

## 2016-09-26 DIAGNOSIS — R293 Abnormal posture: Secondary | ICD-10-CM | POA: Diagnosis not present

## 2016-09-26 NOTE — Therapy (Signed)
[facility] [place], [place], [address] Phone: [phone]   Fax:  [phone]  Physical Therapy Treatment  Patient Details  Name: Angel Maddox MRN: [id number] Date of Birth: Jul 14, 1955 Referring Provider: Suella Broad, MD  Encounter Date: 09/26/2016      PT End of Session - 09/26/16 0905    Visit Number 13   Number of Visits 16   Date for PT Re-Evaluation 10/21/16   PT Start Time 0904   PT Stop Time 1006   PT Time Calculation (min) 62 min   Activity Tolerance Patient tolerated treatment well   Behavior During Therapy [facility] for tasks assessed/performed      Past Medical History:  Diagnosis Date  . Anxiety   . Bipolar 1 disorder ([place])   . Chronic kidney disease    kidney stone  . Depression   . H/O hiatal hernia   . Hip fracture ([place]) 2017   left  . Hypertension   . Multiple sclerosis ([place])     Had for 15 years  . Tremors of nervous system     Past Surgical History:  Procedure Laterality Date  . HIP PINNING,CANNULATED Left 11/09/2015   Procedure: CANNULATED HIP PINNING;  Surgeon: Rod Can, MD;  Location: [facility];  Service: Orthopedics;  Laterality: Left;  . LIPOMA EXCISION    . TUBAL LIGATION      There were no vitals filed for this visit.      Subjective Assessment - 09/26/16 0906    Subjective Patient states she has been feeling better for the past week, but then sat a lot yesterday watching the grandkids and started getting tight in low back and neck. She woke up this morning in pain in neck and back.   Patient Stated Goals Get out of pain.   Currently in Pain? Yes   Pain Score 8    Pain Location Neck   Pain Orientation Right;Left   Pain Descriptors / Indicators Tightness;Aching;Throbbing   Pain Type Chronic pain   Pain Radiating Towards between shoulder blades and B shoulders   Pain Onset More than a month ago   Multiple Pain Sites Yes   Pain Score 8   Pain Location Back   Pain  Orientation Lower   Pain Descriptors / Indicators Aching   Pain Type Chronic pain                         [facility] Adult PT Treatment/Exercise - 09/26/16 0001      Modalities   Modalities Electrical Stimulation;Moist Heat     Moist Heat Therapy   Number Minutes Moist Heat 15 Minutes   Moist Heat Location Cervical;Lumbar Spine     Electrical Stimulation   Electrical Stimulation Location B UT and lumbar   Electrical Stimulation Action IFC   Electrical Stimulation Parameters 80-150 Hz x 1 18mn   Electrical Stimulation Goals Pain;Tone     Manual Therapy   Manual Therapy Soft tissue mobilization   Soft tissue mobilization to B UT, Levator scap and cervical and lumbar paraspinals           Trigger Point Dry Needling - 09/26/16 0953    Consent Given? Yes   Education Handout Provided No   Muscles Treated Upper Body Upper trapezius;Suboccipitals muscle group;Levator scapulae;Quadratus Lumborum  B   Muscles Treated Lower Body Gluteus minimus;Gluteus maximus;Piriformis  B   Upper Trapezius Response Twitch reponse elicited;Palpable increased muscle length   SubOccipitals  Response Twitch response elicited;Palpable increased muscle length   Levator Scapulae Response Twitch response elicited;Palpable increased muscle length   Longissimus Response Twitch response elicited;Palpable increased muscle length  multifidi L4 and L5   Gluteus Maximus Response Twitch response elicited;Palpable increased muscle length   Gluteus Minimus Response Twitch response elicited;Palpable increased muscle length   Piriformis Response Twitch response elicited;Palpable increased muscle length  R                PT Short Term Goals - 09/12/16 1200      PT SHORT TERM GOAL #1   Title Independent with an initial HEP.   Time 2   Period Weeks   Status Achieved     PT SHORT TERM GOAL #2   Title Active bilateral cervical rotation= 60 degrees.   Time 2   Period Weeks   Status New            PT Long Term Goals - 09/12/16 4665      PT LONG TERM GOAL #1   Title Independent with an advanced HEP.   Time 8   Period Weeks   Status New     PT LONG TERM GOAL #2   Title Increase left hip abduction strength to a solid 4+/5 in order to eliminate her Trendelenburg gait pattern.   Baseline left hip ABD 4/5 as of 09/12/16   Time 8   Period Weeks   Status New     PT LONG TERM GOAL #3   Title Sit 20 minutes with pain not > 3/10.   Baseline at 30 min pain is up to 5-6/10   Time 8   Period Weeks   Status New     PT LONG TERM GOAL #4   Title Eliminate left LE pain.     Baseline 70% better as of 09/12/16   Time 8   Period Weeks   Status Partially Met     PT LONG TERM GOAL #5   Title Perform ADL's with pain not > 3/10.   Baseline up to 10/10 as of 09/12/16   Period Days   Status New               Plan - 09/26/16 1040    Clinical Impression Statement Patient tolerated DN fairly well. She is still very hypersensitive particularly in the UT. PT discussed ways to calm patient's nervous system including yoga and breathing techniques. Patient continues to demo poor posture in the clinic and this was discussed with the patient including use of lumbar support in sitting.   PT Treatment/Interventions ADLs/Self Care Home Management;Cryotherapy;Electrical Stimulation;Moist Heat;Ultrasound;Therapeutic activities;Therapeutic exercise;Patient/family education;Manual techniques;Passive range of motion;Dry needling   PT Next Visit Plan Try seated yoga poses with patient. Also teach diaphragmatic breathing for relaxation.  Continue gentle postural strengthening with dry needling in next treatments.      Patient will benefit from skilled therapeutic intervention in order to improve the following deficits and impairments:  Pain, Decreased activity tolerance, Decreased range of motion, Decreased strength  Visit Diagnosis: Cervicalgia  Chronic bilateral low back pain, with  sciatica presence unspecified  Abnormal posture     Problem List Patient Active Problem List   Diagnosis Date Noted  . Osteoporosis with pathological fracture, with routine healing, subsequent encounter 08/30/2016  . MDD (major depressive disorder), recurrent episode, severe ([place]) 03/29/2016  . Estrogen deficiency 12/15/2015  . MS (multiple sclerosis) ([place]) 12/15/2015  . Tremor 12/15/2015  . Healthcare maintenance 12/15/2015  . HTN (hypertension)   .  Protein-calorie malnutrition, severe 11/09/2015  . Closed left hip fracture ([place]) 11/08/2015  . Bipolar 1 disorder, depressed ([place]) 02/11/2015    Madelyn Flavors PT 09/26/2016, 10:52 AM  [facility] [address] [place], [place], [address] Phone: [phone]   Fax:  [phone]  Name: MALLERIE BLOK MRN: [id number] Date of Birth: [birth year]

## 2016-09-27 ENCOUNTER — Ambulatory Visit: Payer: Medicare Other | Admitting: *Deleted

## 2016-09-27 DIAGNOSIS — M545 Low back pain: Secondary | ICD-10-CM | POA: Diagnosis not present

## 2016-09-27 DIAGNOSIS — R2689 Other abnormalities of gait and mobility: Secondary | ICD-10-CM

## 2016-09-27 DIAGNOSIS — M542 Cervicalgia: Secondary | ICD-10-CM | POA: Diagnosis not present

## 2016-09-27 DIAGNOSIS — M6281 Muscle weakness (generalized): Secondary | ICD-10-CM

## 2016-09-27 DIAGNOSIS — R293 Abnormal posture: Secondary | ICD-10-CM | POA: Diagnosis not present

## 2016-09-27 DIAGNOSIS — G543 Thoracic root disorders, not elsewhere classified: Secondary | ICD-10-CM | POA: Diagnosis not present

## 2016-09-27 DIAGNOSIS — G8929 Other chronic pain: Secondary | ICD-10-CM

## 2016-09-27 NOTE — Therapy (Signed)
[facility] [place], [place], [address] Phone: [phone]   Fax:  [phone]  Physical Therapy Treatment  Patient Details  Name: Angel Maddox MRN: [id number] Date of Birth: [birth year] Referring Provider: Suella Broad, MD  Encounter Date: 09/27/2016      PT End of Session - 09/27/16 1133    Visit Number 14   Number of Visits 16   Date for PT Re-Evaluation 10/21/16   PT Start Time 1115   PT Stop Time 1205   PT Time Calculation (min) 50 min      Past Medical History:  Diagnosis Date  . Anxiety   . Bipolar 1 disorder ([place])   . Chronic kidney disease    kidney stone  . Depression   . H/O hiatal hernia   . Hip fracture ([place]) 2017   left  . Hypertension   . Multiple sclerosis ([place])     Had for 15 years  . Tremors of nervous system     Past Surgical History:  Procedure Laterality Date  . HIP PINNING,CANNULATED Left 11/09/2015   Procedure: CANNULATED HIP PINNING;  Surgeon: Rod Can, MD;  Location: [facility];  Service: Orthopedics;  Laterality: Left;  . LIPOMA EXCISION    . TUBAL LIGATION      There were no vitals filed for this visit.      Subjective Assessment - 09/27/16 1117    Subjective Not doing well. The pain keeps coming. Tingling in my face and lips lately.  Did ok with DN, the pain came back   Limitations Sitting   How long can you sit comfortably? 5-10 minutes.   Patient Stated Goals Get out of pain.   Currently in Pain? Yes   Pain Score 8    Pain Location Neck   Pain Orientation Right;Left   Pain Type Chronic pain   Pain Score 8   Pain Location Back   Pain Orientation Lower   Pain Descriptors / Indicators Aching   Pain Onset More than a month ago   Pain Frequency Constant                         [facility] Adult PT Treatment/Exercise - 09/27/16 0001      Exercises   Exercises Lumbar;Knee/Hip     Lumbar Exercises: Seated   Other Seated Lumbar Exercises Yogu cat  and camel x10, arm raise while maintaining drawin and good posture     Lumbar Exercises: Supine   Ab Set 15 reps  Drawin   Other Supine Lumbar Exercises Diaphragmatic breathing in supine and sitting     Modalities   Modalities Electrical Stimulation;Moist Heat     Moist Heat Therapy   Number Minutes Moist Heat 15 Minutes   Moist Heat Location Cervical;Lumbar Spine     Electrical Stimulation   Electrical Stimulation Location B UT and lumbar  IFC x 15 mins   Electrical Stimulation Action IFC   Electrical Stimulation Goals Pain;Tone          Trigger Point Dry Needling - 09/26/16 7672    Consent Given? Yes   Education Handout Provided No   Muscles Treated Upper Body Upper trapezius;Suboccipitals muscle group;Levator scapulae;Quadratus Lumborum  B   Muscles Treated Lower Body Gluteus minimus;Gluteus maximus;Piriformis  B   Upper Trapezius Response Twitch reponse elicited;Palpable increased muscle length   SubOccipitals Response Twitch response elicited;Palpable increased muscle length   Levator Scapulae Response Twitch response elicited;Palpable increased muscle  length   Longissimus Response Twitch response elicited;Palpable increased muscle length  multifidi L4 and L5   Gluteus Maximus Response Twitch response elicited;Palpable increased muscle length   Gluteus Minimus Response Twitch response elicited;Palpable increased muscle length   Piriformis Response Twitch response elicited;Palpable increased muscle length  R              PT Education - 09/27/16 1205    Education provided Yes   Education Details Diaphragmatic breathing and core activation   Person(s) Educated Patient   Methods Explanation;Demonstration;Tactile cues;Verbal cues;Handout   Comprehension Verbalized understanding;Returned demonstration          PT Short Term Goals - 09/12/16 1200      PT SHORT TERM GOAL #1   Title Independent with an initial HEP.   Time 2   Period Weeks   Status Achieved      PT SHORT TERM GOAL #2   Title Active bilateral cervical rotation= 60 degrees.   Time 2   Period Weeks   Status New           PT Long Term Goals - 09/12/16 7591      PT LONG TERM GOAL #1   Title Independent with an advanced HEP.   Time 8   Period Weeks   Status New     PT LONG TERM GOAL #2   Title Increase left hip abduction strength to a solid 4+/5 in order to eliminate her Trendelenburg gait pattern.   Baseline left hip ABD 4/5 as of 09/12/16   Time 8   Period Weeks   Status New     PT LONG TERM GOAL #3   Title Sit 20 minutes with pain not > 3/10.   Baseline at 30 min pain is up to 5-6/10   Time 8   Period Weeks   Status New     PT LONG TERM GOAL #4   Title Eliminate left LE pain.     Baseline 70% better as of 09/12/16   Time 8   Period Weeks   Status Partially Met     PT LONG TERM GOAL #5   Title Perform ADL's with pain not > 3/10.   Baseline up to 10/10 as of 09/12/16   Period Days   Status New               Plan - 09/27/16 1120    Clinical Impression Statement Pt arrived  to clinic today not doing well. She had 8/10 pain in her LB and Neck. She states the DN helped, but then the pain came back.  She was educated in diaphragmatic breathing and core activation Exs. as well as 2 sitting Yogu mobility Exs. HEP pictures were given and need to be reviewed. Decreased pain after Rx to 5-6/10.   Rehab Potential Good   PT Frequency 2x / week   PT Duration 8 weeks   PT Treatment/Interventions ADLs/Self Care Home Management;Cryotherapy;Electrical Stimulation;Moist Heat;Ultrasound;Therapeutic activities;Therapeutic exercise;Patient/family education;Manual techniques;Passive range of motion;Dry needling   PT Next Visit Plan Try seated yoga poses with patient. Also teach diaphragmatic breathing for relaxation.  Continue gentle postural strengthening with dry needling in next treatments.   Consulted and Agree with Plan of Care Patient      Patient will benefit  from skilled therapeutic intervention in order to improve the following deficits and impairments:  Pain, Decreased activity tolerance, Decreased range of motion, Decreased strength  Visit Diagnosis: Cervicalgia  Chronic bilateral low back pain, with sciatica  presence unspecified  Abnormal posture  Thoracic root disorders, not elsewhere classified  Other abnormalities of gait and mobility  Muscle weakness (generalized)     Problem List Patient Active Problem List   Diagnosis Date Noted  . Osteoporosis with pathological fracture, with routine healing, subsequent encounter 08/30/2016  . MDD (major depressive disorder), recurrent episode, severe ([place]) 03/29/2016  . Estrogen deficiency 12/15/2015  . MS (multiple sclerosis) ([place]) 12/15/2015  . Tremor 12/15/2015  . Healthcare maintenance 12/15/2015  . HTN (hypertension)   . Protein-calorie malnutrition, severe 11/09/2015  . Closed left hip fracture ([place]) 11/08/2015  . Bipolar 1 disorder, depressed ([place]) 02/11/2015    [place] Fassnacht,CHRIS, PTA 09/27/2016, 1:11 PM  [facility] [address] [place], [place], [address] Phone: [phone]   Fax:  [phone]  Name: ROVENA HEARLD MRN: [id number] Date of Birth: 1955/11/12

## 2016-09-27 NOTE — Patient Instructions (Signed)

## 2016-09-28 ENCOUNTER — Encounter: Payer: Self-pay | Admitting: Physical Therapy

## 2016-09-28 ENCOUNTER — Ambulatory Visit: Payer: Medicare Other | Admitting: Physical Therapy

## 2016-09-28 DIAGNOSIS — G543 Thoracic root disorders, not elsewhere classified: Secondary | ICD-10-CM | POA: Diagnosis not present

## 2016-09-28 DIAGNOSIS — M542 Cervicalgia: Secondary | ICD-10-CM | POA: Diagnosis not present

## 2016-09-28 DIAGNOSIS — R293 Abnormal posture: Secondary | ICD-10-CM | POA: Diagnosis not present

## 2016-09-28 DIAGNOSIS — R2689 Other abnormalities of gait and mobility: Secondary | ICD-10-CM

## 2016-09-28 DIAGNOSIS — G8929 Other chronic pain: Secondary | ICD-10-CM

## 2016-09-28 DIAGNOSIS — M545 Low back pain: Secondary | ICD-10-CM

## 2016-09-28 DIAGNOSIS — F411 Generalized anxiety disorder: Secondary | ICD-10-CM | POA: Diagnosis not present

## 2016-09-28 DIAGNOSIS — M6281 Muscle weakness (generalized): Secondary | ICD-10-CM

## 2016-09-28 NOTE — Therapy (Signed)
[facility] [place], [place], [address] Phone: [phone]   Fax:  [phone]  Physical Therapy Treatment  Patient Details  Name: Angel Maddox MRN: [id number] Date of Birth: 1955-10-09 Referring Provider: Suella Broad, MD  Encounter Date: 09/28/2016      PT End of Session - 09/28/16 0926    Visit Number 15   Number of Visits 16   PT Start Time 0902   PT Stop Time 1001   PT Time Calculation (min) 59 min   Activity Tolerance Patient tolerated treatment well   Behavior During Therapy [facility] for tasks assessed/performed      Past Medical History:  Diagnosis Date  . Anxiety   . Bipolar 1 disorder ([place])   . Chronic kidney disease    kidney stone  . Depression   . H/O hiatal hernia   . Hip fracture ([place]) 2017   left  . Hypertension   . Multiple sclerosis ([place])     Had for 15 years  . Tremors of nervous system     Past Surgical History:  Procedure Laterality Date  . HIP PINNING,CANNULATED Left 11/09/2015   Procedure: CANNULATED HIP PINNING;  Surgeon: Rod Can, MD;  Location: [facility];  Service: Orthopedics;  Laterality: Left;  . LIPOMA EXCISION    . TUBAL LIGATION      There were no vitals filed for this visit.      Subjective Assessment - 09/28/16 0911    Subjective Patient felt temporary relief after needling session for a few days, this AM 8/10 yet better upon arrival   Limitations Sitting   How long can you sit comfortably? 5-10 minutes.   Patient Stated Goals Get out of pain.   Currently in Pain? Yes   Pain Score 3    Pain Location Neck   Pain Orientation Right;Left   Pain Descriptors / Indicators Tightness   Pain Type Chronic pain   Pain Onset More than a month ago   Pain Frequency Constant   Aggravating Factors  any one position too long   Pain Relieving Factors laying in bed   Pain Score 3   Pain Location Back   Pain Orientation Lower   Pain Descriptors / Indicators Aching   Pain  Type Chronic pain   Pain Onset More than a month ago   Pain Frequency Constant   Aggravating Factors  certain movements   Pain Relieving Factors at rest                         [facility] Adult PT Treatment/Exercise - 09/28/16 0001      Lumbar Exercises: Stretches   Lower Trunk Rotation 5 reps;10 seconds  each side 10sec hold   Piriformis Stretch 5 reps;10 seconds  seated with draw in right LE     Lumbar Exercises: Seated   Other Seated Lumbar Exercises Yogu cat and camel 2x10, arm raise while maintaining draw in and good posture 2x10     Lumbar Exercises: Supine   Ab Set 20 reps  10 seconds seated     Moist Heat Therapy   Number Minutes Moist Heat 15 Minutes   Moist Heat Location Cervical;Lumbar Spine     Electrical Stimulation   Electrical Stimulation Location B UT and lumbar  IFC x 15 mins   Electrical Stimulation Action IFC   Electrical Stimulation Goals Pain;Tone     Manual Therapy   Manual Therapy Soft tissue mobilization  Soft tissue mobilization to B UT, Levator scap and cervical/ suboccipitals and lumbar paraspinals                 PT Education - 09/27/16 1205    Education provided Yes   Education Details Diaphragmatic breathing and core activation   Person(s) Educated Patient   Methods Explanation;Demonstration;Tactile cues;Verbal cues;Handout   Comprehension Verbalized understanding;Returned demonstration          PT Short Term Goals - 09/12/16 1200      PT SHORT TERM GOAL #1   Title Independent with an initial HEP.   Time 2   Period Weeks   Status Achieved     PT SHORT TERM GOAL #2   Title Active bilateral cervical rotation= 60 degrees.   Time 2   Period Weeks   Status New           PT Long Term Goals - 09/12/16 0175      PT LONG TERM GOAL #1   Title Independent with an advanced HEP.   Time 8   Period Weeks   Status New     PT LONG TERM GOAL #2   Title Increase left hip abduction strength to a solid 4+/5 in  order to eliminate her Trendelenburg gait pattern.   Baseline left hip ABD 4/5 as of 09/12/16   Time 8   Period Weeks   Status New     PT LONG TERM GOAL #3   Title Sit 20 minutes with pain not > 3/10.   Baseline at 30 min pain is up to 5-6/10   Time 8   Period Weeks   Status New     PT LONG TERM GOAL #4   Title Eliminate left LE pain.     Baseline 70% better as of 09/12/16   Time 8   Period Weeks   Status Partially Met     PT LONG TERM GOAL #5   Title Perform ADL's with pain not > 3/10.   Baseline up to 10/10 as of 09/12/16   Period Days   Status New               Plan - 09/28/16 0950    Clinical Impression Statement Patient tolerated treatment well today yet limited by pain. Patient able to perform some low level core activation techniques and yoga pose per MPT. Patient had some tightness in muscles in right low back and bil cervical paraspinals and sub occipitals. Patient has reported responding well to treatments yet not long lasting more than a few days. Patient golas ongoing due to pain deficts.    Rehab Potential Good   PT Frequency 2x / week   PT Duration 8 weeks   PT Treatment/Interventions ADLs/Self Care Home Management;Cryotherapy;Electrical Stimulation;Moist Heat;Ultrasound;Therapeutic activities;Therapeutic exercise;Patient/family education;Manual techniques;Passive range of motion;Dry needling   PT Next Visit Plan cont with POC    Consulted and Agree with Plan of Care Patient      Patient will benefit from skilled therapeutic intervention in order to improve the following deficits and impairments:  Pain, Decreased activity tolerance, Decreased range of motion, Decreased strength  Visit Diagnosis: Cervicalgia  Chronic bilateral low back pain, with sciatica presence unspecified  Abnormal posture  Thoracic root disorders, not elsewhere classified  Other abnormalities of gait and mobility  Muscle weakness (generalized)     Problem List Patient  Active Problem List   Diagnosis Date Noted  . Osteoporosis with pathological fracture, with routine healing, subsequent encounter 08/30/2016  .  MDD (major depressive disorder), recurrent episode, severe ([place]) 03/29/2016  . Estrogen deficiency 12/15/2015  . MS (multiple sclerosis) ([place]) 12/15/2015  . Tremor 12/15/2015  . Healthcare maintenance 12/15/2015  . HTN (hypertension)   . Protein-calorie malnutrition, severe 11/09/2015  . Closed left hip fracture ([place]) 11/08/2015  . Bipolar 1 disorder, depressed ([place]) 02/11/2015    Clayson Riling P, PTA 09/28/2016, 10:06 AM  [facility] [place], [place], [address] Phone: [phone]   Fax:  [phone]  Name: Angel Maddox MRN: [id number] Date of Birth: 1955/12/22

## 2016-09-30 ENCOUNTER — Encounter: Payer: Self-pay | Admitting: Physical Therapy

## 2016-10-03 ENCOUNTER — Ambulatory Visit: Payer: Medicare Other | Admitting: Physical Therapy

## 2016-10-03 DIAGNOSIS — R2689 Other abnormalities of gait and mobility: Secondary | ICD-10-CM | POA: Diagnosis not present

## 2016-10-03 DIAGNOSIS — G543 Thoracic root disorders, not elsewhere classified: Secondary | ICD-10-CM | POA: Diagnosis not present

## 2016-10-03 DIAGNOSIS — R293 Abnormal posture: Secondary | ICD-10-CM | POA: Diagnosis not present

## 2016-10-03 DIAGNOSIS — M542 Cervicalgia: Secondary | ICD-10-CM | POA: Diagnosis not present

## 2016-10-03 DIAGNOSIS — G8929 Other chronic pain: Secondary | ICD-10-CM | POA: Diagnosis not present

## 2016-10-03 DIAGNOSIS — M545 Low back pain: Secondary | ICD-10-CM

## 2016-10-03 NOTE — Therapy (Signed)
[facility] [place], [place], [address] Phone: [phone]   Fax:  [phone]  Physical Therapy Treatment  Patient Details  Name: Angel Maddox MRN: [id number] Date of Birth: 02-26-1956 Referring Provider: Suella Broad, MD  Encounter Date: 10/03/2016      PT End of Session - 10/03/16 0901    Visit Number 16   Number of Visits 16   Date for PT Re-Evaluation 10/21/16   PT Start Time 0901   PT Stop Time 1003   PT Time Calculation (min) 62 min   Activity Tolerance Patient tolerated treatment well   Behavior During Therapy [facility] for tasks assessed/performed      Past Medical History:  Diagnosis Date  . Anxiety   . Bipolar 1 disorder ([place])   . Chronic kidney disease    kidney stone  . Depression   . H/O hiatal hernia   . Hip fracture ([place]) 2017   left  . Hypertension   . Multiple sclerosis ([place])     Had for 15 years  . Tremors of nervous system     Past Surgical History:  Procedure Laterality Date  . HIP PINNING,CANNULATED Left 11/09/2015   Procedure: CANNULATED HIP PINNING;  Surgeon: Rod Can, MD;  Location: [facility];  Service: Orthopedics;  Laterality: Left;  . LIPOMA EXCISION    . TUBAL LIGATION      There were no vitals filed for this visit.      Subjective Assessment - 10/03/16 0901    Subjective Patient does not feel she has made any improvements with therapy other than temporary relief with DN. Today she woke up at 3 am with excruciating neck pain, but put ice and heat on it and now it is a 4/10.   Limitations Sitting   How long can you sit comfortably? 5-10 minutes.   Patient Stated Goals Get out of pain.   Currently in Pain? Yes   Pain Score 4    Pain Location Neck   Pain Orientation Right;Left   Pain Descriptors / Indicators Tightness   Pain Type Chronic pain   Pain Radiating Towards between shoulder blades and B shoulders   Pain Onset More than a month ago   Pain Frequency Constant    Aggravating Factors  sitting, any position too long   Pain Relieving Factors lying in bed; heat/ice   Multiple Pain Sites No            [facility] PT Assessment - 10/03/16 0001      AROM   Overall AROM Comments Cervical: R rot 46; L 42 deg                     [facility] Adult PT Treatment/Exercise - 10/03/16 0001      Self-Care   Self-Care Other Self-Care Comments   Other Self-Care Comments  Foam roller against wall for paraspinal MFR; reviewed towel in pillowcase; postural importance     Modalities   Modalities Electrical Stimulation;Moist Heat     Moist Heat Therapy   Number Minutes Moist Heat 15 Minutes   Moist Heat Location Cervical     Electrical Stimulation   Electrical Stimulation Location B UT   Electrical Stimulation Action premod   Electrical Stimulation Parameters 80-150 Hz x 38mn   Electrical Stimulation Goals Tone;Pain     Manual Therapy   Manual Therapy Soft tissue mobilization   Soft tissue mobilization to B UT, lev scap and cervical paraspinals; subocciptitals  Trigger Point Dry Needling - 10/03/16 1022    Consent Given? Yes   Education Handout Provided No   Muscles Treated Upper Body Upper trapezius;Suboccipitals muscle group;Levator scapulae  B   Upper Trapezius Response Twitch reponse elicited;Palpable increased muscle length   SubOccipitals Response Twitch response elicited;Palpable increased muscle length   Levator Scapulae Response Twitch response elicited;Palpable increased muscle length              PT Education - 10/03/16 1024    Education provided Yes   Education Details see self care   Person(s) Educated Patient   Methods Explanation;Demonstration;Verbal cues   Comprehension Verbalized understanding;Returned demonstration          PT Short Term Goals - 10/03/16 0905      PT SHORT TERM GOAL #1   Title Independent with an initial HEP.   Time 2   Period Weeks   Status Achieved     PT SHORT TERM GOAL  #2   Title Active bilateral cervical rotation= 60 degrees.   Baseline R 46 deg, L 42 deg   Time 2   Period Weeks   Status Not Met           PT Long Term Goals - 10/03/16 4782      PT LONG TERM GOAL #1   Title Independent with an advanced HEP.   Time 8   Period Weeks   Status Achieved     PT LONG TERM GOAL #2   Title Increase left hip abduction strength to a solid 4+/5 in order to eliminate her Trendelenburg gait pattern.   Baseline left hip ABD 4-4+/5 as of 10/03/16   Time 8   Period Weeks   Status Not Met     PT LONG TERM GOAL #3   Title Sit 20 minutes with pain not > 3/10.   Baseline at 30 min pain is up to 5-6/10   Time 8   Period Weeks   Status Not Met     PT LONG TERM GOAL #4   Title Eliminate left LE pain.     Baseline 70% better as of 10/03/16   Time 8   Period Weeks   Status Partially Met     PT LONG TERM GOAL #5   Title Perform ADL's with pain not > 3/10.   Baseline up to 10/10 as of 10/03/16   Time 8   Period Weeks   Status Not Met               Plan - 10/03/16 1027    Clinical Impression Statement Patient presented today with increased neck pain but no back pain. She did very well with DN today with less hypersensitivity than in previous sessions. She continued to have localized twitch response in all muscles needled, but less TPs were noted. Overall patient reports no significant improvement in pain and has met only her HEP goals. She continues to demonstrate poor posture in the clinic which she corrects easily with VCs. She continues to amb with an antalgic gait pattern and c/o intermittent pain in L hip. Patient is getting two injections next week in the neck and back.    Rehab Potential Good   PT Frequency 2x / week   PT Duration 8 weeks   PT Treatment/Interventions ADLs/Self Care Home Management;Cryotherapy;Electrical Stimulation;Moist Heat;Ultrasound;Therapeutic activities;Therapeutic exercise;Patient/family education;Manual techniques;Passive  range of motion;Dry needling   PT Next Visit Plan see d/c summary   PT Home Exercise Plan neck ROM/stretches, seated  yoga   Consulted and Agree with Plan of Care Patient      Patient will benefit from skilled therapeutic intervention in order to improve the following deficits and impairments:  Pain, Decreased activity tolerance, Decreased range of motion, Decreased strength  Visit Diagnosis: Cervicalgia  Chronic bilateral low back pain, with sciatica presence unspecified       G-Codes - 10/25/2016 [id number]    Functional Assessment Tool Used (Outpatient Only) FOTO 64% LIMITATION   Mobility: Walking and Moving Around Current Status (G2563) At least 60 percent but less than 80 percent impaired, limited or restricted   Mobility: Walking and Moving Around Goal Status [phone]) At least 60 percent but less than 80 percent impaired, limited or restricted    DISCHARGE STATUS At least 60 percent but less than 80 percent impaired, limited or retricted.  Problem List Patient Active Problem List   Diagnosis Date Noted  . Osteoporosis with pathological fracture, with routine healing, subsequent encounter 08/30/2016  . MDD (major depressive disorder), recurrent episode, severe ([place]) 03/29/2016  . Estrogen deficiency 12/15/2015  . MS (multiple sclerosis) ([place]) 12/15/2015  . Tremor 12/15/2015  . Healthcare maintenance 12/15/2015  . HTN (hypertension)   . Protein-calorie malnutrition, severe 11/09/2015  . Closed left hip fracture ([place]) 11/08/2015  . Bipolar 1 disorder, depressed ([place]) 02/11/2015    Madelyn Flavors PT 10-25-2016, 9:48 PM  [facility] [address] [place], [place], [address] Phone: [phone]   Fax:  [phone]  Name: ALAYNAH SCHUTTER MRN: [id number] Date of Birth: Jun 27, 1956   PHYSICAL THERAPY DISCHARGE SUMMARY  Visits from Start of Care: 16  Current functional level related to goals / functional outcomes: See above    Remaining deficits: See above   Education / Equipment: HEP Plan: Patient agrees to discharge.  Patient goals were partially met. Patient is being discharged due to lack of progress.  ?????         Madelyn Flavors, PT 2016-10-25 9:48 PM [facility] [place], [place], [address] Phone: [phone]   Fax:  [phone]

## 2016-10-10 ENCOUNTER — Encounter: Payer: Self-pay | Admitting: Physical Therapy

## 2016-10-10 DIAGNOSIS — F411 Generalized anxiety disorder: Secondary | ICD-10-CM | POA: Diagnosis not present

## 2016-10-11 DIAGNOSIS — M542 Cervicalgia: Secondary | ICD-10-CM | POA: Diagnosis not present

## 2016-10-11 DIAGNOSIS — M545 Low back pain: Secondary | ICD-10-CM | POA: Diagnosis not present

## 2016-10-25 ENCOUNTER — Other Ambulatory Visit: Payer: Self-pay | Admitting: Family Medicine

## 2016-10-25 DIAGNOSIS — M5136 Other intervertebral disc degeneration, lumbar region: Secondary | ICD-10-CM | POA: Diagnosis not present

## 2016-10-25 DIAGNOSIS — M542 Cervicalgia: Secondary | ICD-10-CM | POA: Diagnosis not present

## 2016-10-25 DIAGNOSIS — M545 Low back pain: Secondary | ICD-10-CM | POA: Diagnosis not present

## 2016-10-25 DIAGNOSIS — G8929 Other chronic pain: Secondary | ICD-10-CM | POA: Diagnosis not present

## 2016-10-27 ENCOUNTER — Other Ambulatory Visit: Payer: Self-pay | Admitting: Family Medicine

## 2016-11-03 ENCOUNTER — Ambulatory Visit: Payer: Medicare Other | Admitting: Family Medicine

## 2016-11-09 DIAGNOSIS — M47812 Spondylosis without myelopathy or radiculopathy, cervical region: Secondary | ICD-10-CM | POA: Diagnosis not present

## 2016-11-22 DIAGNOSIS — M47816 Spondylosis without myelopathy or radiculopathy, lumbar region: Secondary | ICD-10-CM | POA: Diagnosis not present

## 2016-11-24 ENCOUNTER — Ambulatory Visit: Payer: Self-pay | Admitting: Family Medicine

## 2016-11-25 ENCOUNTER — Telehealth: Payer: Self-pay | Admitting: Family Medicine

## 2016-11-25 ENCOUNTER — Encounter: Payer: Self-pay | Admitting: Family Medicine

## 2016-11-25 ENCOUNTER — Other Ambulatory Visit: Payer: Self-pay | Admitting: Family Medicine

## 2016-11-25 NOTE — Telephone Encounter (Signed)
Patient aware she missed apt and states she does not need to reschedule apt.

## 2016-12-08 DIAGNOSIS — M47812 Spondylosis without myelopathy or radiculopathy, cervical region: Secondary | ICD-10-CM | POA: Diagnosis not present

## 2016-12-08 DIAGNOSIS — M5136 Other intervertebral disc degeneration, lumbar region: Secondary | ICD-10-CM | POA: Diagnosis not present

## 2016-12-08 DIAGNOSIS — G894 Chronic pain syndrome: Secondary | ICD-10-CM | POA: Diagnosis not present

## 2016-12-08 DIAGNOSIS — M47816 Spondylosis without myelopathy or radiculopathy, lumbar region: Secondary | ICD-10-CM | POA: Diagnosis not present

## 2016-12-21 DIAGNOSIS — F411 Generalized anxiety disorder: Secondary | ICD-10-CM | POA: Diagnosis not present

## 2017-01-04 ENCOUNTER — Encounter: Payer: Self-pay | Admitting: Family Medicine

## 2017-01-04 ENCOUNTER — Ambulatory Visit (INDEPENDENT_AMBULATORY_CARE_PROVIDER_SITE_OTHER): Payer: Medicare Other | Admitting: Family Medicine

## 2017-01-04 VITALS — BP 122/79 | HR 75 | Temp 98.5°F | Ht 68.0 in | Wt 120.0 lb

## 2017-01-04 DIAGNOSIS — R3989 Other symptoms and signs involving the genitourinary system: Secondary | ICD-10-CM | POA: Diagnosis not present

## 2017-01-04 MED ORDER — VALACYCLOVIR HCL 1 G PO TABS: 1000.0000 mg | ORAL_TABLET | Freq: Two times a day (BID) | ORAL | 0 refills | Status: DC

## 2017-01-04 NOTE — Progress Notes (Signed)
   BP 122/79   Pulse 75   Temp 98.5 F (36.9 C) (Oral)   Ht 5\' 8"  (1.727 m)   Wt 120 lb (54.4 kg)   BMI 18.25 kg/m    Subjective:    Patient ID: Angel Maddox, female    DOB: [birth year], 61 y.o.   MRN: [id number]  HPI: Angel Maddox is a 61 y.o. female presenting on 01/04/2017 for Vaginitis (pt here today c/o vaginal rawness/burning and also husband noticed a blister on her buttocks. She is also c/o having trouble urinating and constipation.)   HPI Genital sore Patient has noticed some rawness and pain in her genital region extending from her labia around towards her buttocks on that left side. She says that they've been present for less than 3 days but has been worsening and spreading along the left side of her genitals. She denies any fevers or chills has had some pain and itchiness and drainage from the sites.  Relevant past medical, surgical, family and social history reviewed and updated as indicated. Interim medical history since our last visit reviewed. Allergies and medications reviewed and updated.  Review of Systems  Constitutional: Negative for chills and fever.  Respiratory: Negative for chest tightness and shortness of breath.   Cardiovascular: Negative for chest pain and leg swelling.  Musculoskeletal: Negative for back pain and gait problem.  Skin: Positive for rash.  Neurological: Negative for light-headedness and headaches.  Psychiatric/Behavioral: Negative for agitation and behavioral problems.  All other systems reviewed and are negative.   Per HPI unless specifically indicated above        Objective:    BP 122/79   Pulse 75   Temp 98.5 F (36.9 C) (Oral)   Ht 5\' 8"  (1.727 m)   Wt 120 lb (54.4 kg)   BMI 18.25 kg/m   Wt Readings from Last 3 Encounters:  01/04/17 120 lb (54.4 kg)  09/01/16 128 lb (58.1 kg)  08/18/16 129 lb 9.6 oz (58.8 kg)    Physical Exam  Constitutional: She is oriented to person, place, and time. She appears  well-developed and well-nourished. No distress.  Eyes: Conjunctivae are normal.  Cardiovascular: Normal rate, regular rhythm, normal heart sounds and intact distal pulses.   No murmur heard. Pulmonary/Chest: Effort normal and breath sounds normal. No respiratory distress. She has no wheezes.  Musculoskeletal: Normal range of motion. She exhibits no edema or tenderness.  Neurological: She is alert and oriented to person, place, and time. Coordination normal.  Skin: Skin is warm and dry. Rash noted. Rash is vesicular. She is not diaphoretic.     Psychiatric: She has a normal mood and affect. Her behavior is normal.  Nursing note and vitals reviewed.     Assessment & Plan:   Problem List Items Addressed This Visit    None    Visit Diagnoses    Genital sore    -  Primary   Appear to be vesicular in nature, will do blood work for herpes and some medication for it   Relevant Medications   valACYclovir (VALTREX) 1000 MG tablet   Other Relevant Orders   STD Screen (8)       Follow up plan: Return if symptoms worsen or fail to improve.  Counseling provided for all of the vaccine components Orders Placed This Encounter  Procedures  . STD Screen (8)    Caryl Pina, MD [facility] 01/04/2017, 10:39 AM

## 2017-01-05 ENCOUNTER — Telehealth: Payer: Self-pay | Admitting: Family Medicine

## 2017-01-05 LAB — STD SCREEN (8)
HIV Screen 4th Generation wRfx: NONREACTIVE
HSV 1 GLYCOPROTEIN G AB, IGG: 33.4 {index} — AB (ref 0.00–0.90)
Hep A IgM: NEGATIVE
Hep B C IgM: NEGATIVE
Hepatitis B Surface Ag: NEGATIVE
RPR: NONREACTIVE

## 2017-01-05 NOTE — Telephone Encounter (Signed)
Spoke with pt and advised to use ice packs, sitz baths sit in cool water and take the Valtrex. It should start to get better over the next couple of days. Advised pt of positive HSV 1 result per Dr Warrick Parisian.

## 2017-01-05 NOTE — Telephone Encounter (Signed)
Patient saw Dr. Warrick Parisian on 01/04/2017 for sore on genital area.  Calling back today to see if he could call her something in to make her more comfortable.  She is very raw there and is very painful.

## 2017-01-10 ENCOUNTER — Ambulatory Visit: Payer: Self-pay | Admitting: Pharmacist

## 2017-01-12 ENCOUNTER — Ambulatory Visit (INDEPENDENT_AMBULATORY_CARE_PROVIDER_SITE_OTHER): Payer: Medicare Other | Admitting: Physician Assistant

## 2017-01-12 ENCOUNTER — Encounter: Payer: Self-pay | Admitting: Physician Assistant

## 2017-01-12 ENCOUNTER — Telehealth: Payer: Self-pay | Admitting: Family Medicine

## 2017-01-12 VITALS — BP 123/89 | HR 82 | Temp 98.9°F | Ht 68.0 in | Wt 117.0 lb

## 2017-01-12 DIAGNOSIS — L03317 Cellulitis of buttock: Secondary | ICD-10-CM

## 2017-01-12 DIAGNOSIS — L0231 Cutaneous abscess of buttock: Secondary | ICD-10-CM

## 2017-01-12 DIAGNOSIS — B009 Herpesviral infection, unspecified: Secondary | ICD-10-CM

## 2017-01-12 DIAGNOSIS — R3989 Other symptoms and signs involving the genitourinary system: Secondary | ICD-10-CM | POA: Diagnosis not present

## 2017-01-12 MED ORDER — SILVER SULFADIAZINE 1 % EX CREA: 1.0000 "application " | TOPICAL_CREAM | Freq: Every day | CUTANEOUS | 0 refills | Status: DC

## 2017-01-12 MED ORDER — SULFAMETHOXAZOLE-TRIMETHOPRIM 800-160 MG PO TABS: 1.0000 | ORAL_TABLET | Freq: Two times a day (BID) | ORAL | 0 refills | Status: DC

## 2017-01-12 MED ORDER — VALACYCLOVIR HCL 1 G PO TABS: 1000.0000 mg | ORAL_TABLET | Freq: Two times a day (BID) | ORAL | 0 refills | Status: DC

## 2017-01-12 MED ORDER — HYDROCODONE-ACETAMINOPHEN 5-325 MG PO TABS: 1.0000 | ORAL_TABLET | Freq: Four times a day (QID) | ORAL | 0 refills | Status: DC | PRN

## 2017-01-12 NOTE — Telephone Encounter (Signed)
I know there isn't anything we can give her for pain, do you want her to come back in and see you?

## 2017-01-12 NOTE — Patient Instructions (Signed)
Genital Herpes Genital herpes is a common sexually transmitted infection (STI) that is caused by a virus. The virus spreads from person to person through sexual contact. Infection can cause itching, blisters, and sores around the genitals or rectum. Symptoms may last several days and then go away This is called an outbreak. However, the virus remains in your body, so you may have more outbreaks in the future. The time between outbreaks varies and can be months or years. Genital herpes affects men and women. It is particularly concerning for pregnant women because the virus can be passed to the baby during delivery and can cause serious problems. Genital herpes is also a concern for people who have a weak disease-fighting (immune) system. What are the causes? This condition is caused by the herpes simplex virus (HSV) type 1 or type 2. The virus may spread through:  Sexual contact with an infected person, including vaginal, anal, and oral sex.  Contact with fluid from a herpes sore.  The skin. This means that you can get herpes from an infected partner even if he or she does not have a visible sore or does not know that he or she is infected. What increases the risk? You are more likely to develop this condition if:  You have sex with many partners.  You do not use latex condoms during sex. What are the signs or symptoms? Most people do not have symptoms (asymptomatic) or have mild symptoms that may be mistaken for other skin problems. Symptoms may include:  Small red bumps near the genitals, rectum, or mouth. These bumps turn into blisters and then turn into sores.  Flu-like symptoms, including:  Fever.  Body aches.  Swollen lymph nodes.  Headache.  Painful urination.  Pain and itching in the genital area or rectal area.  Vaginal discharge.  Tingling or shooting pain in the legs and buttocks. Generally, symptoms are more severe and last longer during the first (primary)  outbreak. Flu-like symptoms are also more common during the primary outbreak. How is this diagnosed? Genital herpes may be diagnosed based on:  A physical exam.  Your medical history.  Blood tests.  A test of a fluid sample (culture) from an open sore. How is this treated? There is no cure for this condition, but treatment with antiviral medicines that are taken by mouth (orally) can do the following:  Speed up healing and relieve symptoms.  Help to reduce the spread of the virus to sexual partners.  Limit the chance of future outbreaks, or make future outbreaks shorter.  Lessen symptoms of future outbreaks. Your health care provider may also recommend pain relief medicines, such as aspirin or ibuprofen. Follow these instructions at home: Sexual activity   Do not have sexual contact during active outbreaks.  Practice safe sex. Latex condoms and female condoms may help prevent the spread of the herpes virus. General instructions   Keep the affected areas dry and clean.  Take over-the-counter and prescription medicines only as told by your health care provider.  Avoid rubbing or touching blisters and sores. If you do touch blisters or sores:  Wash your hands thoroughly with soap and water.  Do not touch your eyes afterward.  To help relieve pain or itching, you may take the following actions as directed by your health care provider:  Apply a cold, wet cloth (cold compress) to affected areas 4-6 times a day.  Apply a substance that protects your skin and reduces bleeding (astringent).  Apply a   gel that helps relieve pain around sores (lidocaine gel).  Take a warm, shallow bath that cleans the genital area (sitz bath).  Keep all follow-up visits as told by your health care provider. This is important. How is this prevented?  Use condoms. Although anyone can get genital herpes during sexual contact, even with the use of a condom, a condom can provide some  protection.  Avoid having multiple sexual partners.  Talk with your sexual partner about any symptoms either of you may have. Also, talk with your partner about any history of STIs.  Get tested for STIs before you have sex. Ask your partner to do the same.  Do not have sexual contact if you have symptoms of genital herpes. Contact a health care provider if:  Your symptoms are not improving with medicine.  Your symptoms return.  You have new symptoms.  You have a fever.  You have abdominal pain.  You have redness, swelling, or pain in your eye.  You notice new sores on other parts of your body.  You are a woman and experience bleeding between menstrual periods.  You have had herpes and you become pregnant or plan to become pregnant. Summary  Genital herpes is a common sexually transmitted infection (STI) that is caused by the herpes simplex virus (HSV) type 1 or type 2.  These viruses are most often spread through sexual contact with an infected person.  You are more likely to develop this condition if you have sex with many partners or you have unprotected sex.  Most people do not have symptoms (asymptomatic) or have mild symptoms that may be mistaken for other skin problems. Symptoms occur as outbreaks that may happen months or years apart.  There is no cure for this condition, but treatment with oral antiviral medicines can reduce symptoms, reduce the chance of spreading the virus to a partner, prevent future outbreaks, or shorten future outbreaks. This information is not intended to replace advice given to you by your health care provider. Make sure you discuss any questions you have with your health care provider. Document Released: 06/10/2000 Document Revised: 05/13/2016 Document Reviewed: 05/13/2016 Elsevier Interactive Patient Education  2017 [employer]  

## 2017-01-12 NOTE — Telephone Encounter (Signed)
Have her come back in if she is still having that much issues and see one of [place].

## 2017-01-12 NOTE — Telephone Encounter (Signed)
appt scheduled Pt notified 

## 2017-01-12 NOTE — Progress Notes (Signed)
BP 123/89   Pulse 82   Temp 98.9 F (37.2 C) (Oral)   Ht 5\' 8"  (1.727 m)   Wt 117 lb (53.1 kg)   BMI 17.79 kg/m    Subjective:    Patient ID: Angel Maddox, female    DOB: 1956/03/17, 61 y.o.   MRN: [id number]  HPI: Angel Maddox is a 61 y.o. female presenting on 01/12/2017 for Vaginal Pain (Blisters)  The patient comes in for recheck on her first outbreak of genital herpes. Her blood test was positive for HSV-1. She has significant ulcers in the vulvar and buttock area. It is of great pain for her to sit refill of her legs to touch. She denies any fever or chills. She is taking her Valtrex as directed.  Relevant past medical, surgical, family and social history reviewed and updated as indicated. Allergies and medications reviewed and updated.  Past Medical History:  Diagnosis Date  . Anxiety   . Bipolar 1 disorder ([place])   . Chronic kidney disease    kidney stone  . Depression   . H/O hiatal hernia   . Hip fracture ([place]) 2017   left  . Hypertension   . Multiple sclerosis ([place])     Had for 15 years  . Tremors of nervous system     Past Surgical History:  Procedure Laterality Date  . HIP PINNING,CANNULATED Left 11/09/2015   Procedure: CANNULATED HIP PINNING;  Surgeon: Rod Can, MD;  Location: [facility];  Service: Orthopedics;  Laterality: Left;  . LIPOMA EXCISION    . TUBAL LIGATION      Review of Systems  Constitutional: Negative.  Negative for activity change, fatigue and fever.  HENT: Negative.   Eyes: Negative.   Respiratory: Negative.  Negative for cough.   Cardiovascular: Negative.  Negative for chest pain.  Gastrointestinal: Negative.  Negative for abdominal pain.  Endocrine: Negative.   Genitourinary: Positive for vaginal pain. Negative for dysuria.  Musculoskeletal: Negative.   Skin: Positive for color change and wound.  Neurological: Negative.     Allergies as of 01/12/2017   No Known Allergies     Medication List       Accurate as of  01/12/17  3:38 PM. Always use your most recent med list.          buPROPion 300 MG 24 hr tablet Commonly known as:  WELLBUTRIN XL TAKE 1 TABLET EVERY DAY   CALCIUM 600 600 MG Tabs tablet Generic drug:  calcium carbonate Take 600 mg by mouth daily.   chlorthalidone 25 MG tablet Commonly known as:  HYGROTON TAKE 1 TABLET (25 MG TOTAL) BY MOUTH DAILY.   citalopram 40 MG tablet Commonly known as:  CELEXA TAKE 1 TABLET (40 MG TOTAL) BY MOUTH DAILY WITH BREAKFAST.   clonazePAM 0.5 MG tablet Commonly known as:  KLONOPIN Take 0.25 mg by mouth at bedtime.   gabapentin 800 MG tablet Commonly known as:  NEURONTIN Take 200 mg by mouth 3 (three) times daily.   HYDROcodone-acetaminophen 5-325 MG tablet Commonly known as:  NORCO/VICODIN Take 1 tablet by mouth every 6 (six) hours as needed for moderate pain.   hydrOXYzine 10 MG tablet Commonly known as:  ATARAX/VISTARIL TAKE 1 TO 3 TABLETS 3 TIMES A DAY AS NEEDED ANXIETY   meloxicam 15 MG tablet Commonly known as:  MOBIC TAKE 1 TABLET (15 MG TOTAL) BY MOUTH DAILY.   primidone 250 MG tablet Commonly known as:  MYSOLINE Take 1 tablet (250 mg  total) by mouth every 8 (eight) hours.   risperiDONE 1 MG tablet Commonly known as:  RISPERDAL Take 1 mg by mouth at bedtime.   silver sulfADIAZINE 1 % cream Commonly known as:  SILVADENE Apply 1 application topically daily. Fill with JAR please   sulfamethoxazole-trimethoprim 800-160 MG tablet Commonly known as:  BACTRIM DS,SEPTRA DS Take 1 tablet by mouth 2 (two) times daily.   traZODone 50 MG tablet Commonly known as:  DESYREL Take 1 tablet (50 mg total) by mouth at bedtime and may repeat dose one time if needed.   valACYclovir 1000 MG tablet Commonly known as:  VALTREX Take 1 tablet (1,000 mg total) by mouth 2 (two) times daily. Then stay on one tab daily as prevention   WOMENS DAILY FORMULA PO Take by mouth.          Objective:    BP 123/89   Pulse 82   Temp 98.9 F  (37.2 C) (Oral)   Ht 5\' 8"  (1.727 m)   Wt 117 lb (53.1 kg)   BMI 17.79 kg/m   No Known Allergies  Physical Exam  Constitutional: She is oriented to person, place, and time. She appears well-developed and well-nourished.  HENT:  Head: Normocephalic and atraumatic.  Eyes: Pupils are equal, round, and reactive to light. Conjunctivae and EOM are normal.  Cardiovascular: Normal rate, regular rhythm, normal heart sounds and intact distal pulses.   Pulmonary/Chest: Effort normal and breath sounds normal.  Abdominal: Soft. Bowel sounds are normal.  Genitourinary:    No labial fusion. There is tenderness and lesion on the right labia. There is tenderness and lesion on the left labia.  Genitourinary Comments: Open ulcer areas in the vulvar and both buttocks with surrounding erythema on the skin, extremely tender to touch, patient had to stay lying down during the entire encounter because it hurt too badly to sit on this area  Neurological: She is alert and oriented to person, place, and time. She has normal reflexes.  Skin: Skin is warm and dry. No rash noted.  Psychiatric: She has a normal mood and affect. Her behavior is normal. Judgment and thought content normal.  Nursing note and vitals reviewed.   Results for orders placed or performed in visit on 01/04/17  STD Screen (8)  Result Value Ref Range   Hep A IgM Negative Negative   Hepatitis B Surface Ag Negative Negative   Hep B C IgM Negative Negative   Hep C Virus Ab <0.1 0.0 - 0.9 s/co ratio   RPR Ser Ql Non Reactive Non Reactive   HIV Screen 4th Generation wRfx Non Reactive Non Reactive   HSV 1 Glycoprotein G Ab, IgG 33.40 (H) 0.00 - 0.90 index   HSV 2 Glycoprotein G Ab, IgG <0.91 0.00 - 0.90 index      Assessment & Plan:   1. HSV-1 infection - valACYclovir (VALTREX) 1000 MG tablet; Take 1 tablet (1,000 mg total) by mouth 2 (two) times daily. Then stay on one tab daily as prevention  Dispense: 40 tablet; Refill: 0  2.  Cellulitis and abscess of buttock - sulfamethoxazole-trimethoprim (BACTRIM DS,SEPTRA DS) 800-160 MG tablet; Take 1 tablet by mouth 2 (two) times daily.  Dispense: 20 tablet; Refill: 0 - silver sulfADIAZINE (SILVADENE) 1 % cream; Apply 1 application topically daily. Fill with JAR please  Dispense: 50 g; Refill: 0  3. Genital sore - valACYclovir (VALTREX) 1000 MG tablet; Take 1 tablet (1,000 mg total) by mouth 2 (two) times daily. Then  stay on one tab daily as prevention  Dispense: 40 tablet; Refill: 0   Current Outpatient Prescriptions:  .  buPROPion (WELLBUTRIN XL) 300 MG 24 hr tablet, TAKE 1 TABLET EVERY DAY, Disp: 30 tablet, Rfl: 0 .  calcium carbonate (CALCIUM 600) 600 MG TABS tablet, Take 600 mg by mouth daily., Disp: , Rfl:  .  chlorthalidone (HYGROTON) 25 MG tablet, TAKE 1 TABLET (25 MG TOTAL) BY MOUTH DAILY., Disp: 90 tablet, Rfl: 0 .  citalopram (CELEXA) 40 MG tablet, TAKE 1 TABLET (40 MG TOTAL) BY MOUTH DAILY WITH BREAKFAST., Disp: , Rfl: 3 .  clonazePAM (KLONOPIN) 0.5 MG tablet, Take 0.25 mg by mouth at bedtime., Disp: , Rfl:  .  gabapentin (NEURONTIN) 800 MG tablet, Take 200 mg by mouth 3 (three) times daily., Disp: , Rfl:  .  hydrOXYzine (ATARAX/VISTARIL) 10 MG tablet, TAKE 1 TO 3 TABLETS 3 TIMES A DAY AS NEEDED ANXIETY, Disp: , Rfl: 0 .  meloxicam (MOBIC) 15 MG tablet, TAKE 1 TABLET (15 MG TOTAL) BY MOUTH DAILY., Disp: 30 tablet, Rfl: 0 .  Multiple Vitamins-Minerals (WOMENS DAILY FORMULA PO), Take by mouth., Disp: , Rfl:  .  primidone (MYSOLINE) 250 MG tablet, Take 1 tablet (250 mg total) by mouth every 8 (eight) hours., Disp: 90 tablet, Rfl: 0 .  risperiDONE (RISPERDAL) 1 MG tablet, Take 1 mg by mouth at bedtime. , Disp: , Rfl:  .  traZODone (DESYREL) 50 MG tablet, Take 1 tablet (50 mg total) by mouth at bedtime and may repeat dose one time if needed., Disp: 30 tablet, Rfl: 0 .  valACYclovir (VALTREX) 1000 MG tablet, Take 1 tablet (1,000 mg total) by mouth 2 (two) times daily. Then  stay on one tab daily as prevention, Disp: 40 tablet, Rfl: 0 .  HYDROcodone-acetaminophen (NORCO/VICODIN) 5-325 MG tablet, Take 1 tablet by mouth every 6 (six) hours as needed for moderate pain., Disp: 30 tablet, Rfl: 0 .  silver sulfADIAZINE (SILVADENE) 1 % cream, Apply 1 application topically daily. Fill with JAR please, Disp: 50 g, Rfl: 0 .  sulfamethoxazole-trimethoprim (BACTRIM DS,SEPTRA DS) 800-160 MG tablet, Take 1 tablet by mouth 2 (two) times daily., Disp: 20 tablet, Rfl: 0  Continue all other maintenance medications as listed above.  Follow up plan: Return if symptoms worsen or fail to improve.  Educational handout given for herpes  Terald Sleeper PA-C [place] [address]  [place], [place] [address] [phone]   01/12/2017, 3:38 PM

## 2017-01-16 ENCOUNTER — Telehealth: Payer: Self-pay | Admitting: Physician Assistant

## 2017-01-16 MED ORDER — FLUCONAZOLE 150 MG PO TABS: 150.0000 mg | ORAL_TABLET | Freq: Every day | ORAL | 0 refills | Status: DC

## 2017-01-16 NOTE — Telephone Encounter (Signed)
As much antibiotic that I gave her could certainly create a yeast infection. Stop the topical silvadene.  Finish the oral bactrim.  Are the ulcers getting better? Add diflucan 150 mg one tablet daily for 7 days. #7 Can use desitin or boudreaux's butt paste for and irritated areas if needed.

## 2017-01-16 NOTE — Telephone Encounter (Signed)
Patients states that they are starting to scab over and some are still a little raw. Patient aware of recommendations. Diflucan rx sent to pharmacy.

## 2017-01-18 DIAGNOSIS — F411 Generalized anxiety disorder: Secondary | ICD-10-CM | POA: Diagnosis not present

## 2017-01-21 ENCOUNTER — Telehealth: Payer: Self-pay | Admitting: Family Medicine

## 2017-01-21 NOTE — Telephone Encounter (Signed)
Pt given appt with [facility] Monday at 2:55.

## 2017-01-23 ENCOUNTER — Encounter: Payer: Self-pay | Admitting: Physician Assistant

## 2017-01-23 ENCOUNTER — Ambulatory Visit (INDEPENDENT_AMBULATORY_CARE_PROVIDER_SITE_OTHER): Payer: Medicare Other | Admitting: Physician Assistant

## 2017-01-23 VITALS — BP 100/74 | HR 82 | Temp 98.2°F | Ht 68.0 in | Wt 119.2 lb

## 2017-01-23 DIAGNOSIS — B009 Herpesviral infection, unspecified: Secondary | ICD-10-CM

## 2017-01-23 DIAGNOSIS — R3989 Other symptoms and signs involving the genitourinary system: Secondary | ICD-10-CM | POA: Diagnosis not present

## 2017-01-23 DIAGNOSIS — B372 Candidiasis of skin and nail: Secondary | ICD-10-CM | POA: Diagnosis not present

## 2017-01-23 DIAGNOSIS — F411 Generalized anxiety disorder: Secondary | ICD-10-CM | POA: Diagnosis not present

## 2017-01-23 MED ORDER — VALACYCLOVIR HCL 1 G PO TABS: 1000.0000 mg | ORAL_TABLET | Freq: Two times a day (BID) | ORAL | 11 refills | Status: DC

## 2017-01-23 MED ORDER — FLUCONAZOLE 150 MG PO TABS: 150.0000 mg | ORAL_TABLET | Freq: Every day | ORAL | 2 refills | Status: DC

## 2017-01-23 MED ORDER — HYDROCODONE-ACETAMINOPHEN 5-325 MG PO TABS: 1.0000 | ORAL_TABLET | Freq: Four times a day (QID) | ORAL | 0 refills | Status: DC | PRN

## 2017-01-23 NOTE — Progress Notes (Signed)
BP 100/74   Pulse 82   Temp 98.2 F (36.8 C) (Oral)   Ht 5\' 8"  (1.727 m)   Wt 119 lb 3.2 oz (54.1 kg)   BMI 18.12 kg/m    Subjective:    Patient ID: Angel Maddox, female    DOB: 11/09/1955, 61 y.o.   MRN: [id number]  HPI: Angel Maddox is a 61 y.o. female presenting on 01/23/2017 for Follow-up (HSV-1)  Most of the patient's wounds have healed. The ones in the buttock and perineal area are covered up very well. She still has some in the upper part of her buttock that are just slow to heal. At this time she is having a significant amount of yeast between her buttocks related to all antibiotics she is used. We have had a long conversation again about her staying on the antiviral medicine to reduce her viral shedding. Her husband is present in the room. They seem to be relieved of knowing that this will greatly decrease any viral shedding especially when she is not having an outbreak and taking the medication. He has never had an outbreak.  She's had a great decline in her multiple sclerosis. She is planning on seeing Dr. Dellis Filbert very soon. He is in the [facility] area. He was previously at [facility].   Relevant past medical, surgical, family and social history reviewed and updated as indicated. Allergies and medications reviewed and updated.  Past Medical History:  Diagnosis Date  . Anxiety   . Bipolar 1 disorder ([place])   . Chronic kidney disease    kidney stone  . Depression   . H/O hiatal hernia   . Hip fracture ([place]) 2017   left  . Hypertension   . Multiple sclerosis ([place])     Had for 15 years  . Tremors of nervous system     Past Surgical History:  Procedure Laterality Date  . HIP PINNING,CANNULATED Left 11/09/2015   Procedure: CANNULATED HIP PINNING;  Surgeon: Rod Can, MD;  Location: [facility];  Service: Orthopedics;  Laterality: Left;  . LIPOMA EXCISION    . TUBAL LIGATION      Review of Systems  HENT: Negative.   Eyes: Negative.     Respiratory: Negative.   Gastrointestinal: Negative.   Genitourinary: Negative.   Musculoskeletal: Positive for gait problem.  Skin: Positive for rash and wound.  Neurological: Positive for weakness.    Allergies as of 01/23/2017   No Known Allergies     Medication List       Accurate as of 01/23/17 11:59 PM. Always use your most recent med list.          buPROPion 300 MG 24 hr tablet Commonly known as:  WELLBUTRIN XL TAKE 1 TABLET EVERY DAY   CALCIUM 600 600 MG Tabs tablet Generic drug:  calcium carbonate Take 600 mg by mouth daily.   chlorthalidone 25 MG tablet Commonly known as:  HYGROTON TAKE 1 TABLET (25 MG TOTAL) BY MOUTH DAILY.   citalopram 40 MG tablet Commonly known as:  CELEXA TAKE 1 TABLET (40 MG TOTAL) BY MOUTH DAILY WITH BREAKFAST.   clonazePAM 0.5 MG tablet Commonly known as:  KLONOPIN Take 0.25 mg by mouth at bedtime.   fluconazole 150 MG tablet Commonly known as:  DIFLUCAN Take 1 tablet (150 mg total) by mouth daily.   gabapentin 800 MG tablet Commonly known as:  NEURONTIN Take 200 mg by mouth 3 (three) times daily.   HYDROcodone-acetaminophen  5-325 MG tablet Commonly known as:  NORCO/VICODIN Take 1 tablet by mouth every 6 (six) hours as needed for moderate pain.   hydrOXYzine 10 MG tablet Commonly known as:  ATARAX/VISTARIL TAKE 1 TO 3 TABLETS 3 TIMES A DAY AS NEEDED ANXIETY   meloxicam 15 MG tablet Commonly known as:  MOBIC TAKE 1 TABLET (15 MG TOTAL) BY MOUTH DAILY.   primidone 250 MG tablet Commonly known as:  MYSOLINE Take 1 tablet (250 mg total) by mouth every 8 (eight) hours.   risperiDONE 1 MG tablet Commonly known as:  RISPERDAL Take 1 mg by mouth at bedtime.   silver sulfADIAZINE 1 % cream Commonly known as:  SILVADENE Apply 1 application topically daily. Fill with JAR please   traZODone 50 MG tablet Commonly known as:  DESYREL Take 1 tablet (50 mg total) by mouth at bedtime and may repeat dose one time if needed.    valACYclovir 1000 MG tablet Commonly known as:  VALTREX Take 1 tablet (1,000 mg total) by mouth 2 (two) times daily. Then stay on one tab daily as prevention   WOMENS DAILY FORMULA PO Take by mouth.          Objective:    BP 100/74   Pulse 82   Temp 98.2 F (36.8 C) (Oral)   Ht 5\' 8"  (1.727 m)   Wt 119 lb 3.2 oz (54.1 kg)   BMI 18.12 kg/m   No Known Allergies  Physical Exam  Constitutional: She is oriented to person, place, and time. She appears well-developed and well-nourished.  HENT:  Head: Normocephalic and atraumatic.  Eyes: Pupils are equal, round, and reactive to light. Conjunctivae and EOM are normal.  Cardiovascular: Normal rate, regular rhythm, normal heart sounds and intact distal pulses.   Pulmonary/Chest: Effort normal and breath sounds normal.  Abdominal: Soft. Bowel sounds are normal.  Neurological: She is alert and oriented to person, place, and time. She has normal reflexes. She displays atrophy. She displays no tremor. She exhibits abnormal muscle tone. She displays no seizure activity.  Skin: Skin is warm and dry. Lesion noted. No rash noted. There is erythema.     Healing shallow ulcers  Psychiatric: She has a normal mood and affect. Her behavior is normal. Judgment and thought content normal.    Results for orders placed or performed in visit on 01/04/17  STD Screen (8)  Result Value Ref Range   Hep A IgM Negative Negative   Hepatitis B Surface Ag Negative Negative   Hep B C IgM Negative Negative   Hep C Virus Ab <0.1 0.0 - 0.9 s/co ratio   RPR Ser Ql Non Reactive Non Reactive   HIV Screen 4th Generation wRfx Non Reactive Non Reactive   HSV 1 Glycoprotein G Ab, IgG 33.40 (H) 0.00 - 0.90 index   HSV 2 Glycoprotein G Ab, IgG <0.91 0.00 - 0.90 index      Assessment & Plan:   1. Genital sore - valACYclovir (VALTREX) 1000 MG tablet; Take 1 tablet (1,000 mg total) by mouth 2 (two) times daily. Then stay on one tab daily as prevention  Dispense:  40 tablet; Refill: 11  2. HSV-1 infection - valACYclovir (VALTREX) 1000 MG tablet; Take 1 tablet (1,000 mg total) by mouth 2 (two) times daily. Then stay on one tab daily as prevention  Dispense: 40 tablet; Refill: 11  3. Candidal dermatitis - fluconazole (DIFLUCAN) 150 MG tablet; Take 1 tablet (150 mg total) by mouth daily.  Dispense: 14  tablet; Refill: 2   Current Outpatient Prescriptions:  .  buPROPion (WELLBUTRIN XL) 300 MG 24 hr tablet, TAKE 1 TABLET EVERY DAY, Disp: 30 tablet, Rfl: 0 .  calcium carbonate (CALCIUM 600) 600 MG TABS tablet, Take 600 mg by mouth daily., Disp: , Rfl:  .  chlorthalidone (HYGROTON) 25 MG tablet, TAKE 1 TABLET (25 MG TOTAL) BY MOUTH DAILY., Disp: 90 tablet, Rfl: 0 .  citalopram (CELEXA) 40 MG tablet, TAKE 1 TABLET (40 MG TOTAL) BY MOUTH DAILY WITH BREAKFAST., Disp: , Rfl: 3 .  clonazePAM (KLONOPIN) 0.5 MG tablet, Take 0.25 mg by mouth at bedtime., Disp: , Rfl:  .  gabapentin (NEURONTIN) 800 MG tablet, Take 200 mg by mouth 3 (three) times daily., Disp: , Rfl:  .  HYDROcodone-acetaminophen (NORCO/VICODIN) 5-325 MG tablet, Take 1 tablet by mouth every 6 (six) hours as needed for moderate pain., Disp: 30 tablet, Rfl: 0 .  hydrOXYzine (ATARAX/VISTARIL) 10 MG tablet, TAKE 1 TO 3 TABLETS 3 TIMES A DAY AS NEEDED ANXIETY, Disp: , Rfl: 0 .  meloxicam (MOBIC) 15 MG tablet, TAKE 1 TABLET (15 MG TOTAL) BY MOUTH DAILY., Disp: 30 tablet, Rfl: 0 .  Multiple Vitamins-Minerals (WOMENS DAILY FORMULA PO), Take by mouth., Disp: , Rfl:  .  primidone (MYSOLINE) 250 MG tablet, Take 1 tablet (250 mg total) by mouth every 8 (eight) hours., Disp: 90 tablet, Rfl: 0 .  risperiDONE (RISPERDAL) 1 MG tablet, Take 1 mg by mouth at bedtime. , Disp: , Rfl:  .  traZODone (DESYREL) 50 MG tablet, Take 1 tablet (50 mg total) by mouth at bedtime and may repeat dose one time if needed., Disp: 30 tablet, Rfl: 0 .  valACYclovir (VALTREX) 1000 MG tablet, Take 1 tablet (1,000 mg total) by mouth 2 (two)  times daily. Then stay on one tab daily as prevention, Disp: 40 tablet, Rfl: 11 .  fluconazole (DIFLUCAN) 150 MG tablet, Take 1 tablet (150 mg total) by mouth daily., Disp: 14 tablet, Rfl: 2 .  silver sulfADIAZINE (SILVADENE) 1 % cream, Apply 1 application topically daily. Fill with JAR please (Patient not taking: Reported on 01/23/2017), Disp: 50 g, Rfl: 0  Continue all other maintenance medications as listed above.  Follow up plan: Return in about 3 months (around 04/25/2017).  Educational handout given for [place] PA-C [place] [address]  [place], [place] [address] [phone]   01/24/2017, 9:23 AM

## 2017-01-23 NOTE — Patient Instructions (Signed)
In a few days you may receive a survey in the mail or online from [employer] regarding your visit with us today. Please take a moment to fill this out. Your feedback is very important to our whole office. It can help us better understand your needs as well as improve your experience and satisfaction. Thank you for taking your time to complete it. We care about you.  Bladen Umar, PA-C  

## 2017-01-24 ENCOUNTER — Other Ambulatory Visit: Payer: Self-pay | Admitting: Family Medicine

## 2017-01-25 ENCOUNTER — Ambulatory Visit (INDEPENDENT_AMBULATORY_CARE_PROVIDER_SITE_OTHER): Payer: Medicare Other | Admitting: *Deleted

## 2017-01-25 ENCOUNTER — Encounter: Payer: Self-pay | Admitting: Gastroenterology

## 2017-01-25 VITALS — BP 113/78 | HR 83 | Temp 98.3°F | Ht 68.0 in | Wt 118.4 lb

## 2017-01-25 DIAGNOSIS — Z1211 Encounter for screening for malignant neoplasm of colon: Secondary | ICD-10-CM

## 2017-01-25 DIAGNOSIS — Z0000 Encounter for general adult medical examination without abnormal findings: Secondary | ICD-10-CM

## 2017-01-25 NOTE — Progress Notes (Signed)
Subjective:   Angel Maddox is a 62 y.o. female who presents for an Initial Medicare Annual Wellness Visit.  Review of Systems    Angel Maddox is a 61 year old female here today for an initial Medicare Annual Wellness exam. 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 She enjoys spending time with her 5 grandchildren. She was exercising on a daily basis but has not been able to for the last 2 months due to her health issues. She states her diet is healthy. She attends a bible study and is involved with grief share at [facility]. Her son died 2.5 years ago and she has one daughter. She lives in [place] with her husband and they have one cat in the home. Fall hazards were discussed with patient. Patient feels that her health is worse now than it was a year ago at this time. Patient was tearful today. She states that she tries not to cry over her sons death because she has been told that she should be able to move on by now. Appointment was scheduled with Particia Nearing, PA-C to discuss depression,  fatigue and leg pains she has been having. Patient is due to follow up with her neurologist in a week Dr. Dellis Filbert. She is afraid that she is having a flare up with her MS.         Objective:    Today's Vitals   01/25/17 0952  BP: 113/78  Pulse: 83  Temp: 98.3 F (36.8 C)  TempSrc: Oral  Weight: 118 lb 6.4 oz (53.7 kg)  Height: 5\' 8"  (1.727 m)   Body mass index is 18 kg/m.   Current Medications (verified) Outpatient Encounter Prescriptions as of 01/25/2017  Medication Sig  . buPROPion (WELLBUTRIN XL) 300 MG 24 hr tablet TAKE 1 TABLET EVERY DAY  . calcium carbonate (CALCIUM 600) 600 MG TABS tablet Take 600 mg by mouth daily.  . chlorthalidone (HYGROTON) 25 MG tablet TAKE 1 TABLET (25 MG TOTAL) BY MOUTH DAILY.  . citalopram (CELEXA) 40 MG tablet TAKE 1 TABLET (40 MG TOTAL) BY MOUTH DAILY WITH BREAKFAST.  . clonazePAM (KLONOPIN) 0.5 MG  tablet Take 0.25 mg by mouth at bedtime.  . fluconazole (DIFLUCAN) 150 MG tablet Take 1 tablet (150 mg total) by mouth daily.  Marland Kitchen gabapentin (NEURONTIN) 800 MG tablet Take 200 mg by mouth 3 (three) times daily.  Marland Kitchen HYDROcodone-acetaminophen (NORCO/VICODIN) 5-325 MG tablet Take 1 tablet by mouth every 6 (six) hours as needed for moderate pain.  . meloxicam (MOBIC) 15 MG tablet TAKE 1 TABLET (15 MG TOTAL) BY MOUTH DAILY.  . Multiple Vitamins-Minerals (WOMENS DAILY FORMULA PO) Take by mouth.  . primidone (MYSOLINE) 250 MG tablet Take 1 tablet (250 mg total) by mouth every 8 (eight) hours.  . risedronate (ACTONEL) 150 MG tablet TAKE 1 TAB EVERY THIRTY DAYS WITH WATER ON AN EMPTY STOMACH, NOTHING BY MOUTH OR LIE DOWN FOR 30MINS  . risperiDONE (RISPERDAL) 1 MG tablet Take 1 mg by mouth at bedtime.   . traZODone (DESYREL) 50 MG tablet Take 1 tablet (50 mg total) by mouth at bedtime and may repeat dose one time if needed.  . valACYclovir (VALTREX) 1000 MG tablet Take 1 tablet (1,000 mg total) by mouth 2 (two) times daily. Then stay on one tab daily as prevention  . hydrOXYzine (ATARAX/VISTARIL) 10 MG tablet TAKE 1 TO 3 TABLETS 3 TIMES A DAY AS NEEDED ANXIETY  . [  DISCONTINUED] silver sulfADIAZINE (SILVADENE) 1 % cream Apply 1 application topically daily. Fill with JAR please (Patient not taking: Reported on 01/23/2017)   No facility-administered encounter medications on file as of 01/25/2017.     Allergies (verified) Patient has no known allergies.   History: Past Medical History:  Diagnosis Date  . Anxiety   . Bipolar 1 disorder ([place])   . Chronic kidney disease    kidney stone  . Depression   . H/O hiatal hernia   . Hip fracture ([place]) 2017   left  . Hypertension   . Multiple sclerosis ([place])     Had for 15 years  . Tremors of nervous system    Past Surgical History:  Procedure Laterality Date  . HIP PINNING,CANNULATED Left 11/09/2015   Procedure: CANNULATED HIP PINNING;  Surgeon: Rod Can, MD;  Location: [facility];  Service: Orthopedics;  Laterality: Left;  . LIPOMA EXCISION    . TUBAL LIGATION     Family History  Problem Relation Age of Onset  . Heart attack Mother   . Cancer Maternal Aunt    Social History   Occupational History  . Not on file.   Social History Main Topics  . Smoking status: Former Smoker    Types: Cigarettes    Quit date: 01/25/1991  . Smokeless tobacco: Never Used  . Alcohol use Yes     Comment: wine once a month  . Drug use: No  . Sexual activity: Not on file    Tobacco Counseling Counseling given: Not Answered Patient quit smoking in 1992.  Activities of Daily Living In your present state of health, do you have any difficulty performing the following activities: 01/25/2017 07/25/2016  Hearing? N N  Vision? N N  Comment Wears glasses -  Difficulty concentrating or making decisions? N N  Walking or climbing stairs? Y N  Comment Patient has MS  -  Dressing or bathing? N N  Doing errands, shopping? N -  Some encounter information is confidential and restricted. Go to Review Flowsheets activity to see all data.  Some recent data might be hidden  Patient wears glasses daily. Patient has a hard time walking and climbing stairs at time due to MS.   Immunizations and Health Maintenance  There is no immunization history on file for this patient. Health Maintenance Due  Topic Date Due  . TETANUS/TDAP  04/13/1975  . PAP SMEAR  01/19/2015  . COLONOSCOPY  10/19/2016  . INFLUENZA VACCINE  01/25/2017    Referral placed for colonoscopy Patient states she will call and schedule a pap with Dr. Runell Gess after she see's her neurologist.   Patient Care Team: Timmothy Euler, MD as PCP - General (Family Medicine)  Indicate any recent Medical Services you may have received from other than [facility] providers in the past year (date may be approximate).     Assessment:   This is a routine wellness examination for Angel Maddox.   Hearing/Vision  screen No exam data present Patient has no concerns with her hearing. Patient wears glasses daily Dietary issues and exercise activities discussed:    Goals    . Exercise 3x per week (30 min per time)    . Have 3 meals a day      Depression Screen PHQ 2/9 Scores 01/25/2017 01/23/2017 01/12/2017 01/04/2017 09/01/2016 08/18/2016 01/13/2016  PHQ - 2 Score 5 3 6 4 3 6  0  PHQ- 9 Score 23 20 24 6 13 21  -   Appointment scheduled  with Particia Nearing, PA-C Fall Risk Fall Risk  09/01/2016 08/18/2016 01/25/2016 01/13/2016 12/15/2015  Falls in the past year? Yes Yes Yes No No  Number falls in past yr: 2 or more 2 or more 2 or more - -  Injury with Fall? Yes Yes Yes - -  Comment - - - - -  Risk Factor Category  - - High Fall Risk - -  Follow up - - Falls evaluation completed - -    Cognitive Function:        Screening Tests Health Maintenance  Topic Date Due  . TETANUS/TDAP  04/13/1975  . PAP SMEAR  01/19/2015  . COLONOSCOPY  10/19/2016  . INFLUENZA VACCINE  01/25/2017  . MAMMOGRAM  03/21/2018  . Hepatitis C Screening  Completed  . HIV Screening  Completed      Plan:    Patient will follow up with Particia Nearing, PA-C. Patient will keep appointment with Dr. Jacqulynn Cadet Neurolgist Referral placed for Colonoscopy Patient will call and schedule an appointment with Dr. Runell Gess for her pap.  Patient is due for an eye exam and will also call and schedule. Patient would like to discuss Tdap and pneumonia vaccines at next visit.   I have personally reviewed and noted the following in the patient's chart:   . Medical and social history . Use of alcohol, tobacco or illicit drugs  . Current medications and supplements . Functional ability and status . Nutritional status . Physical activity . Advanced directives . List of other physicians . Hospitalizations, surgeries, and ER visits in previous 12 months . Vitals . Screenings to include cognitive, depression, and falls . Referrals and  appointments  In addition, I have reviewed and discussed with patient certain preventive protocols, quality metrics, and best practice recommendations. A written personalized care plan for preventive services as well as general preventive health recommendations were provided to patient.     Gareth Morgan, LPN   01/28/6961

## 2017-01-25 NOTE — Addendum Note (Signed)
Addended by: Thana Ates on: 01/25/2017 12:22 PM   Modules accepted: Level of Service

## 2017-01-25 NOTE — Patient Instructions (Addendum)
  Angel Maddox , Thank you for taking time to come for your Medicare Wellness Visit. I appreciate your ongoing commitment to your health goals. Please review the following plan we discussed and let me know if I can assist you in the future.   These are the goals we discussed: Goals    . Exercise 3x per week (30 min per time)    . Have 3 meals a day       This is a list of the screening recommended for you and due dates:  Health Maintenance  Topic Date Due  . Tetanus Vaccine  04/13/1975  . Pap Smear  01/19/2015  . Colon Cancer Screening  10/19/2016  . Flu Shot  01/25/2017  . Mammogram  03/21/2018  .  Hepatitis C: One time screening is recommended by Center for Disease Control  (CDC) for  adults born from [birth year] through 1965.   Completed  . HIV Screening  Completed  .  Try Miralax OTC to help with the constipation Keep follow up appointment with Glenard Haring Keep follow up Appointment with Dr. Dellis Filbert Mammogram is due in September

## 2017-02-01 ENCOUNTER — Encounter: Payer: Self-pay | Admitting: Physician Assistant

## 2017-02-01 ENCOUNTER — Ambulatory Visit (INDEPENDENT_AMBULATORY_CARE_PROVIDER_SITE_OTHER): Payer: Medicare Other | Admitting: Physician Assistant

## 2017-02-01 VITALS — BP 118/84 | HR 73 | Temp 98.6°F | Ht 68.0 in | Wt 118.0 lb

## 2017-02-01 DIAGNOSIS — K5904 Chronic idiopathic constipation: Secondary | ICD-10-CM

## 2017-02-01 DIAGNOSIS — R945 Abnormal results of liver function studies: Secondary | ICD-10-CM

## 2017-02-01 DIAGNOSIS — R5383 Other fatigue: Secondary | ICD-10-CM | POA: Insufficient documentation

## 2017-02-01 DIAGNOSIS — R7989 Other specified abnormal findings of blood chemistry: Secondary | ICD-10-CM | POA: Diagnosis not present

## 2017-02-01 DIAGNOSIS — F332 Major depressive disorder, recurrent severe without psychotic features: Secondary | ICD-10-CM

## 2017-02-01 DIAGNOSIS — Z0000 Encounter for general adult medical examination without abnormal findings: Secondary | ICD-10-CM | POA: Diagnosis not present

## 2017-02-01 MED ORDER — LINACLOTIDE 145 MCG PO CAPS: 145.0000 ug | ORAL_CAPSULE | Freq: Every day | ORAL | 11 refills | Status: DC

## 2017-02-01 NOTE — Patient Instructions (Signed)
In a few days you may receive a survey in the mail or online from [employer] regarding your visit with us today. Please take a moment to fill this out. Your feedback is very important to our whole office. It can help us better understand your needs as well as improve your experience and satisfaction. Thank you for taking your time to complete it. We care about you.  Alexxus Sobh, PA-C  

## 2017-02-01 NOTE — Progress Notes (Signed)
 BP 118/84   Pulse 73   Temp 98.6 F (37 C) (Oral)   Ht 5' 8" (1.727 m)   Wt 118 lb (53.5 kg)   SpO2 97%   BMI 17.94 kg/m    Subjective:    Patient ID: Angel Maddox, female    DOB: 07/08/1955, 60 y.o.   MRN: [id number]  HPI: Angel Maddox is a 60 y.o. female presenting on 02/01/2017 for Fatigue; Leg Pain; Shortness of Breath; and Constipation  This patient comes in for periodic recheck on medications and conditions including Chronic constipation, depression, excessive fatigue. Patient also has MS. She sees Dr. Jeffries. Her appointment that was scheduled for this month had to be moved because of his illness. She will not be seen him until November now. She has not been taking any main multiple sclerosis medicine and is in an episode of severe weakness. She is also having chronic constipation. It will bother her at times and then have minimal improvement. She reports being very fatigued overall. She has shortness of breath with walking due to decompensation.   All medications are reviewed today. There are no reports of any problems with the medications. All of the medical conditions are reviewed and updated.  Lab work is reviewed and will be ordered as medically necessary. There are no new problems reported with today's visit.   Relevant past medical, surgical, family and social history reviewed and updated as indicated. Allergies and medications reviewed and updated.  Past Medical History:  Diagnosis Date  . Anxiety   . Bipolar 1 disorder ([facility])   . Chronic kidney disease    kidney stone  . Depression   . H/O hiatal hernia   . Hip fracture ([facility]) 2017   left  . Hypertension   . Multiple sclerosis ([facility])     Had for 15 years  . Tremors of nervous system     Past Surgical History:  Procedure Laterality Date  . HIP PINNING,CANNULATED Left 11/09/2015   Procedure: CANNULATED HIP PINNING;  Surgeon: Brian Swinteck, MD;  Location: [facility];  Service: Orthopedics;  Laterality:  Left;  . LIPOMA EXCISION    . TUBAL LIGATION      Review of Systems  Constitutional: Negative for activity change, fatigue and fever.  HENT: Negative.   Eyes: Negative.   Respiratory: Negative.  Negative for cough.   Cardiovascular: Negative.  Negative for chest pain.  Gastrointestinal: Negative.  Negative for abdominal pain.  Endocrine: Negative.   Genitourinary: Negative.  Negative for dysuria.  Musculoskeletal: Positive for arthralgias, gait problem and myalgias.  Skin: Negative.   Neurological: Positive for weakness. Negative for dizziness, light-headedness, numbness and headaches.  Psychiatric/Behavioral: Positive for dysphoric mood.    Allergies as of 02/01/2017   No Known Allergies     Medication List       Accurate as of 02/01/17  1:32 PM. Always use your most recent med list.          buPROPion 300 MG 24 hr tablet Commonly known as:  WELLBUTRIN XL TAKE 1 TABLET EVERY DAY   CALCIUM 600 600 MG Tabs tablet Generic drug:  calcium carbonate Take 600 mg by mouth daily.   chlorthalidone 25 MG tablet Commonly known as:  HYGROTON TAKE 1 TABLET (25 MG TOTAL) BY MOUTH DAILY.   citalopram 40 MG tablet Commonly known as:  CELEXA TAKE 1 TABLET (40 MG TOTAL) BY MOUTH DAILY WITH BREAKFAST.   clonazePAM 0.5 MG tablet Commonly known as:  KLONOPIN   Take 0.25 mg by mouth at bedtime.   fluconazole 150 MG tablet Commonly known as:  DIFLUCAN Take 1 tablet (150 mg total) by mouth daily.   gabapentin 800 MG tablet Commonly known as:  NEURONTIN Take 200 mg by mouth 3 (three) times daily.   HYDROcodone-acetaminophen 5-325 MG tablet Commonly known as:  NORCO/VICODIN Take 1 tablet by mouth every 6 (six) hours as needed for moderate pain.   hydrOXYzine 10 MG tablet Commonly known as:  ATARAX/VISTARIL TAKE 1 TO 3 TABLETS 3 TIMES A DAY AS NEEDED ANXIETY   linaclotide 145 MCG Caps capsule Commonly known as:  LINZESS Take 1 capsule (145 mcg total) by mouth daily before  breakfast.   meloxicam 15 MG tablet Commonly known as:  MOBIC TAKE 1 TABLET (15 MG TOTAL) BY MOUTH DAILY.   primidone 250 MG tablet Commonly known as:  MYSOLINE Take 1 tablet (250 mg total) by mouth every 8 (eight) hours.   risedronate 150 MG tablet Commonly known as:  ACTONEL TAKE 1 TAB EVERY THIRTY DAYS WITH WATER ON AN EMPTY STOMACH, NOTHING BY MOUTH OR LIE DOWN FOR 30MINS   risperiDONE 1 MG tablet Commonly known as:  RISPERDAL Take 1 mg by mouth at bedtime.   traZODone 50 MG tablet Commonly known as:  DESYREL Take 1 tablet (50 mg total) by mouth at bedtime and may repeat dose one time if needed.   valACYclovir 1000 MG tablet Commonly known as:  VALTREX Take 1 tablet (1,000 mg total) by mouth 2 (two) times daily. Then stay on one tab daily as prevention   WOMENS DAILY FORMULA PO Take by mouth.          Objective:    BP 118/84   Pulse 73   Temp 98.6 F (37 C) (Oral)   Ht 5' 8" (1.727 m)   Wt 118 lb (53.5 kg)   SpO2 97%   BMI 17.94 kg/m   No Known Allergies  Physical Exam  Constitutional: She is oriented to person, place, and time. She appears well-developed and well-nourished.  HENT:  Head: Normocephalic and atraumatic.  Eyes: Pupils are equal, round, and reactive to light. Conjunctivae and EOM are normal.  Cardiovascular: Normal rate, regular rhythm, normal heart sounds and intact distal pulses.   Pulmonary/Chest: Effort normal and breath sounds normal.  Abdominal: Soft. Bowel sounds are normal.  Neurological: She is alert and oriented to person, place, and time. She has normal reflexes.  Skin: Skin is warm and dry. No rash noted.  Psychiatric: She has a normal mood and affect. Her behavior is normal. Judgment and thought content normal.  Nursing note and vitals reviewed.   Results for orders placed or performed in visit on 01/04/17  STD Screen (8)  Result Value Ref Range   Hep A IgM Negative Negative   Hepatitis B Surface Ag Negative Negative   Hep  B C IgM Negative Negative   Hep C Virus Ab <0.1 0.0 - 0.9 s/co ratio   RPR Ser Ql Non Reactive Non Reactive   HIV Screen 4th Generation wRfx Non Reactive Non Reactive   HSV 1 Glycoprotein G Ab, IgG 33.40 (H) 0.00 - 0.90 index   HSV 2 Glycoprotein G Ab, IgG <0.91 0.00 - 0.90 index      Assessment & Plan:   1. Chronic idiopathic constipation - linaclotide (LINZESS) 145 MCG CAPS capsule; Take 1 capsule (145 mcg total) by mouth daily before breakfast.  Dispense: 30 capsule; Refill: 11  2. Severe episode  of recurrent major depressive disorder, without psychotic features ([facility])  3. Fatigue, unspecified type  4. Well adult exam Not performed - CBC with Differential/Platelet - CMP14+EGFR - Lipid panel - TSH    Current Outpatient Prescriptions:  .  buPROPion (WELLBUTRIN XL) 300 MG 24 hr tablet, TAKE 1 TABLET EVERY DAY, Disp: 30 tablet, Rfl: 0 .  calcium carbonate (CALCIUM 600) 600 MG TABS tablet, Take 600 mg by mouth daily., Disp: , Rfl:  .  chlorthalidone (HYGROTON) 25 MG tablet, TAKE 1 TABLET (25 MG TOTAL) BY MOUTH DAILY., Disp: 90 tablet, Rfl: 0 .  citalopram (CELEXA) 40 MG tablet, TAKE 1 TABLET (40 MG TOTAL) BY MOUTH DAILY WITH BREAKFAST., Disp: , Rfl: 3 .  clonazePAM (KLONOPIN) 0.5 MG tablet, Take 0.25 mg by mouth at bedtime., Disp: , Rfl:  .  fluconazole (DIFLUCAN) 150 MG tablet, Take 1 tablet (150 mg total) by mouth daily., Disp: 14 tablet, Rfl: 2 .  gabapentin (NEURONTIN) 800 MG tablet, Take 200 mg by mouth 3 (three) times daily., Disp: , Rfl:  .  HYDROcodone-acetaminophen (NORCO/VICODIN) 5-325 MG tablet, Take 1 tablet by mouth every 6 (six) hours as needed for moderate pain., Disp: 30 tablet, Rfl: 0 .  hydrOXYzine (ATARAX/VISTARIL) 10 MG tablet, TAKE 1 TO 3 TABLETS 3 TIMES A DAY AS NEEDED ANXIETY, Disp: , Rfl: 0 .  meloxicam (MOBIC) 15 MG tablet, TAKE 1 TABLET (15 MG TOTAL) BY MOUTH DAILY., Disp: 30 tablet, Rfl: 0 .  Multiple Vitamins-Minerals (WOMENS DAILY FORMULA PO), Take by  mouth., Disp: , Rfl:  .  primidone (MYSOLINE) 250 MG tablet, Take 1 tablet (250 mg total) by mouth every 8 (eight) hours., Disp: 90 tablet, Rfl: 0 .  risedronate (ACTONEL) 150 MG tablet, TAKE 1 TAB EVERY THIRTY DAYS WITH WATER ON AN EMPTY STOMACH, NOTHING BY MOUTH OR LIE DOWN FOR 30MINS, Disp: , Rfl: 3 .  risperiDONE (RISPERDAL) 1 MG tablet, Take 1 mg by mouth at bedtime. , Disp: , Rfl:  .  traZODone (DESYREL) 50 MG tablet, Take 1 tablet (50 mg total) by mouth at bedtime and may repeat dose one time if needed., Disp: 30 tablet, Rfl: 0 .  valACYclovir (VALTREX) 1000 MG tablet, Take 1 tablet (1,000 mg total) by mouth 2 (two) times daily. Then stay on one tab daily as prevention, Disp: 40 tablet, Rfl: 11 .  linaclotide (LINZESS) 145 MCG CAPS capsule, Take 1 capsule (145 mcg total) by mouth daily before breakfast., Disp: 30 capsule, Rfl: 11 Continue all other maintenance medications as listed above.  Follow up plan: Return in about 3 months (around 05/04/2017) for recheck.  Educational handout given for survey   S.  PA-C [facility] [address]  [place], [place] [address] [phone]   02/01/2017, 1:32 PM   

## 2017-02-02 LAB — CBC WITH DIFFERENTIAL/PLATELET
BASOS ABS: 0 10*3/uL (ref 0.0–0.2)
Basos: 0 %
EOS (ABSOLUTE): 0.1 10*3/uL (ref 0.0–0.4)
Eos: 1 %
Hematocrit: 36.5 % (ref 34.0–46.6)
Hemoglobin: 12.3 g/dL (ref 11.1–15.9)
Immature Grans (Abs): 0 10*3/uL (ref 0.0–0.1)
Immature Granulocytes: 0 %
LYMPHS ABS: 1.5 10*3/uL (ref 0.7–3.1)
Lymphs: 28 %
MCH: 32.5 pg (ref 26.6–33.0)
MCHC: 33.7 g/dL (ref 31.5–35.7)
MCV: 97 fL (ref 79–97)
MONOS ABS: 0.5 10*3/uL (ref 0.1–0.9)
Monocytes: 9 %
Neutrophils Absolute: 3.2 10*3/uL (ref 1.4–7.0)
Neutrophils: 62 %
PLATELETS: 301 10*3/uL (ref 150–379)
RBC: 3.78 x10E6/uL (ref 3.77–5.28)
RDW: 15.8 % — AB (ref 12.3–15.4)
WBC: 5.3 10*3/uL (ref 3.4–10.8)

## 2017-02-02 LAB — CMP14+EGFR
ALK PHOS: 85 IU/L (ref 39–117)
ALT: 132 IU/L — AB (ref 0–32)
AST: 85 IU/L — AB (ref 0–40)
Albumin/Globulin Ratio: 1.7 (ref 1.2–2.2)
Albumin: 4.2 g/dL (ref 3.6–4.8)
BUN/Creatinine Ratio: 20 (ref 12–28)
BUN: 18 mg/dL (ref 8–27)
Bilirubin Total: 0.2 mg/dL (ref 0.0–1.2)
CHLORIDE: 93 mmol/L — AB (ref 96–106)
CO2: 24 mmol/L (ref 20–29)
Calcium: 9.8 mg/dL (ref 8.7–10.3)
Creatinine, Ser: 0.88 mg/dL (ref 0.57–1.00)
GFR calc Af Amer: 83 mL/min/{1.73_m2} (ref >59)
GFR calc non Af Amer: 72 mL/min/{1.73_m2} (ref >59)
GLUCOSE: 80 mg/dL (ref 65–99)
Globulin, Total: 2.5 g/dL (ref 1.5–4.5)
Potassium: 4.2 mmol/L (ref 3.5–5.2)
Sodium: 136 mmol/L (ref 134–144)
Total Protein: 6.7 g/dL (ref 6.0–8.5)

## 2017-02-02 LAB — LIPID PANEL
CHOLESTEROL TOTAL: 307 mg/dL — AB (ref 100–199)
Chol/HDL Ratio: 4 ratio (ref 0.0–4.4)
HDL: 76 mg/dL (ref >39)
LDL CALC: 205 mg/dL — AB (ref 0–99)
TRIGLYCERIDES: 132 mg/dL (ref 0–149)
VLDL CHOLESTEROL CAL: 26 mg/dL (ref 5–40)

## 2017-02-02 LAB — TSH: TSH: 1.18 u[IU]/mL (ref 0.450–4.500)

## 2017-02-03 NOTE — Addendum Note (Signed)
Addended by: Thana Ates on: 02/03/2017 04:46 PM   Modules accepted: Orders

## 2017-02-08 ENCOUNTER — Telehealth: Payer: Self-pay | Admitting: *Deleted

## 2017-02-08 NOTE — Telephone Encounter (Signed)
Patient aware to come next Wednesday to have repeat liver tests done due to elevated result on previous test.   Patient requests to have a copy given of lab work to take with her to her follow up appointment with the [facility] doctor.

## 2017-02-09 ENCOUNTER — Other Ambulatory Visit: Payer: Medicare Other

## 2017-02-09 DIAGNOSIS — R7989 Other specified abnormal findings of blood chemistry: Secondary | ICD-10-CM | POA: Diagnosis not present

## 2017-02-09 DIAGNOSIS — R945 Abnormal results of liver function studies: Principal | ICD-10-CM

## 2017-02-10 LAB — CMP14+EGFR
A/G RATIO: 1.9 (ref 1.2–2.2)
ALT: 117 IU/L — ABNORMAL HIGH (ref 0–32)
AST: 68 IU/L — AB (ref 0–40)
Albumin: 4.6 g/dL (ref 3.6–4.8)
Alkaline Phosphatase: 88 IU/L (ref 39–117)
BUN/Creatinine Ratio: 21 (ref 12–28)
BUN: 15 mg/dL (ref 8–27)
CHLORIDE: 90 mmol/L — AB (ref 96–106)
CO2: 27 mmol/L (ref 20–29)
Calcium: 9.4 mg/dL (ref 8.7–10.3)
Creatinine, Ser: 0.73 mg/dL (ref 0.57–1.00)
GFR calc Af Amer: 104 mL/min/{1.73_m2} (ref >59)
GFR calc non Af Amer: 90 mL/min/{1.73_m2} (ref >59)
GLUCOSE: 99 mg/dL (ref 65–99)
Globulin, Total: 2.4 g/dL (ref 1.5–4.5)
POTASSIUM: 3.1 mmol/L — AB (ref 3.5–5.2)
Sodium: 134 mmol/L (ref 134–144)
TOTAL PROTEIN: 7 g/dL (ref 6.0–8.5)

## 2017-02-15 DIAGNOSIS — D72819 Decreased white blood cell count, unspecified: Secondary | ICD-10-CM | POA: Diagnosis not present

## 2017-02-15 DIAGNOSIS — G35 Multiple sclerosis: Secondary | ICD-10-CM | POA: Diagnosis not present

## 2017-02-15 DIAGNOSIS — F339 Major depressive disorder, recurrent, unspecified: Secondary | ICD-10-CM | POA: Diagnosis not present

## 2017-02-16 ENCOUNTER — Other Ambulatory Visit: Payer: Self-pay | Admitting: *Deleted

## 2017-02-16 DIAGNOSIS — R51 Headache: Principal | ICD-10-CM

## 2017-02-16 DIAGNOSIS — R519 Headache, unspecified: Secondary | ICD-10-CM

## 2017-02-20 ENCOUNTER — Ambulatory Visit
Admission: RE | Admit: 2017-02-20 | Discharge: 2017-02-20 | Disposition: A | Discharge status: Home / Self Care | Payer: Medicare Other | Source: Ambulatory Visit | Attending: *Deleted | Admitting: *Deleted

## 2017-02-20 DIAGNOSIS — R51 Headache: Secondary | ICD-10-CM | POA: Diagnosis not present

## 2017-02-20 DIAGNOSIS — R251 Tremor, unspecified: Secondary | ICD-10-CM | POA: Diagnosis not present

## 2017-02-20 DIAGNOSIS — R519 Headache, unspecified: Secondary | ICD-10-CM

## 2017-02-20 DIAGNOSIS — F411 Generalized anxiety disorder: Secondary | ICD-10-CM | POA: Diagnosis not present

## 2017-02-20 MED ORDER — GADOBENATE DIMEGLUMINE 529 MG/ML IV SOLN: 10.0000 mL | Freq: Once | INTRAVENOUS | Status: DC | PRN

## 2017-02-22 DIAGNOSIS — F411 Generalized anxiety disorder: Secondary | ICD-10-CM | POA: Diagnosis not present

## 2017-03-07 ENCOUNTER — Ambulatory Visit (INDEPENDENT_AMBULATORY_CARE_PROVIDER_SITE_OTHER): Payer: Medicare Other | Admitting: Physician Assistant

## 2017-03-07 ENCOUNTER — Encounter: Payer: Self-pay | Admitting: Physician Assistant

## 2017-03-07 VITALS — BP 135/89 | HR 76 | Temp 98.7°F | Ht 68.0 in | Wt 123.0 lb

## 2017-03-07 DIAGNOSIS — R1310 Dysphagia, unspecified: Secondary | ICD-10-CM | POA: Diagnosis not present

## 2017-03-07 DIAGNOSIS — R945 Abnormal results of liver function studies: Secondary | ICD-10-CM

## 2017-03-07 DIAGNOSIS — R7989 Other specified abnormal findings of blood chemistry: Secondary | ICD-10-CM

## 2017-03-07 DIAGNOSIS — K2100 Gastro-esophageal reflux disease with esophagitis, without bleeding: Secondary | ICD-10-CM

## 2017-03-07 DIAGNOSIS — R197 Diarrhea, unspecified: Secondary | ICD-10-CM

## 2017-03-07 DIAGNOSIS — E876 Hypokalemia: Secondary | ICD-10-CM

## 2017-03-07 DIAGNOSIS — R1319 Other dysphagia: Secondary | ICD-10-CM

## 2017-03-07 MED ORDER — OMEPRAZOLE 20 MG PO CPDR: 20.0000 mg | DELAYED_RELEASE_CAPSULE | Freq: Every day | ORAL | 11 refills | Status: DC

## 2017-03-07 NOTE — Patient Instructions (Addendum)
Gastroesophageal Reflux Scan A gastroesophageal reflux scan is a procedure that is used to check for gastroesophageal reflux, which is the backward flow of stomach contents into the tube that carries food from the mouth to the stomach (esophagus). The scan can also show if any stomach contents are inhaled (aspirated) into your lungs. You may need this scan if you have symptoms such as heartburn, vomiting, swallowing problems, or regurgitation. Regurgitation means that swallowed food is returning from the stomach to the esophagus. For this scan, you will drink a liquid that contains a small amount of a radioactive substance (tracer). A scanner with a camera that detects the radioactive tracer is used to see if any of the material backs up into your esophagus. Tell a health care provider about:  Any allergies you have.  All medicines you are taking, including vitamins, herbs, eye drops, creams, and over-the-counter medicines.  Any blood disorders you have.  Any surgeries you have had.  Any medical conditions you have.  If you are pregnant or you think that you may be pregnant.  If you are breastfeeding. What are the risks? Generally, this is a safe procedure. However, problems may occur, including:  Exposure to radiation (a small amount).  Allergic reaction to the radioactive substance. This is rare.  What happens before the procedure?  Ask your health care provider about changing or stopping your regular medicines. This is especially important if you are taking diabetes medicines or blood thinners.  Follow your health care provider's instructions about eating or drinking restrictions. What happens during the procedure?  You will be asked to drink a liquid that contains a small amount of a radioactive tracer. This liquid will probably be similar to orange juice.  You will assume a position lying on your back.  A series of images will be taken of your esophagus and upper  stomach.  You may be asked to move into different positions to help determine if reflux occurs more often when you are in specific positions.  For adults, an abdominal binder with an inflatable cuff may be placed on the belly (abdomen). This may be used to increase abdominal pressure. More images will be taken to see if the increased pressure causes reflux to occur. The procedure may vary among health care providers and hospitals. What happens after the procedure?  Return to your normal activities and your normal diet as directed by your health care provider.  The radioactive tracer will leave your body over the next few days. Drink enough fluid to keep your urine clear or pale yellow. This will help to flush the tracer out of your body.  It is your responsibility to obtain your test results. Ask your health care provider or the department performing the test when and how you will get your results. This information is not intended to replace advice given to you by your health care provider. Make sure you discuss any questions you have with your health care provider. Document Released: 08/04/2005 Document Revised: 03/07/2016 Document Reviewed: 03/25/2014 Elsevier Interactive Patient Education  2018 [employer]  

## 2017-03-07 NOTE — Progress Notes (Signed)
BP 135/89   Pulse 76   Temp 98.7 F (37.1 C) (Oral)   Ht _0  (1.727 m)   Wt 123 lb (55.8 kg)   BMI 18.70 kg/m    Subjective:    Patient ID: Angel Maddox, female    DOB: 07/13/1955, 61 y.o.   MRN: [id number]  HPI: Angel Maddox is a 61 y.o. female presenting on 03/07/2017 for Diarrhea (episodes of diarrhea x 3 weeks) and Elevated LFT's   The patient comes in today for chronic diarrhea. This has been a new development,  Previously she had constipation. She was put on Linzess. Once her bowels started moving too much she has discontinued it. She has increased gas and indigestion. She does have some dysphasia and esophageal phase. She is not vomiting any food up. There has not been any weight loss. She will be going for new treatment for her multiple sclerosis in another month. The patient has an appointment with gastroenterology for a colonoscopy. Howeto changes in her symptoms, we are going to have her see the  Gastroenterologist sooner.  Relevant past medical, surgical, family and social history reviewed and updated as indicated. Allergies and medications reviewed and updated.  Past Medical History:  Diagnosis Date  . Anxiety   . Bipolar 1 disorder ([place])   . Chronic kidney disease    kidney stone  . Depression   . H/O hiatal hernia   . Hip fracture ([place]) 2017   left  . Hypertension   . Multiple sclerosis ([place])     Had for 15 years  . Tremors of nervous system     Past Surgical History:  Procedure Laterality Date  . HIP PINNING,CANNULATED Left 11/09/2015   Procedure: CANNULATED HIP PINNING;  Surgeon: Rod Can, MD;  Location: [facility];  Service: Orthopedics;  Laterality: Left;  . LIPOMA EXCISION    . TUBAL LIGATION      Review of Systems  Constitutional: Negative for activity change, fatigue and fever.  HENT: Negative.   Eyes: Negative.   Respiratory: Negative.  Negative for cough.   Cardiovascular: Negative.  Negative for chest pain.  Gastrointestinal:  Positive for abdominal distention, abdominal pain, diarrhea and nausea. Negative for blood in stool, constipation and vomiting.  Endocrine: Negative.   Genitourinary: Negative.  Negative for dysuria.  Musculoskeletal: Negative.   Skin: Negative.  Negative for pallor and wound.  Neurological: Positive for dizziness, weakness and numbness.    Allergies as of 03/07/2017   No Known Allergies     Medication List       Accurate as of 03/07/17  1:27 PM. Always use your most recent med list.          buPROPion 300 MG 24 hr tablet Commonly known as:  WELLBUTRIN XL TAKE 1 TABLET EVERY DAY   CALCIUM 600 600 MG Tabs tablet Generic drug:  calcium carbonate Take 600 mg by mouth daily.   chlorthalidone 25 MG tablet Commonly known as:  HYGROTON TAKE 1 TABLET (25 MG TOTAL) BY MOUTH DAILY.   citalopram 40 MG tablet Commonly known as:  CELEXA TAKE 1 TABLET (40 MG TOTAL) BY MOUTH DAILY WITH BREAKFAST.   clonazePAM 0.5 MG tablet Commonly known as:  KLONOPIN Take 0.25 mg by mouth at bedtime.   fluconazole 150 MG tablet Commonly known as:  DIFLUCAN Take 1 tablet (150 mg total) by mouth daily.   gabapentin 800 MG tablet Commonly known as:  NEURONTIN Take 200 mg by mouth 3 (three) times  daily.   HYDROcodone-acetaminophen 5-325 MG tablet Commonly known as:  NORCO/VICODIN Take 1 tablet by mouth every 6 (six) hours as needed for moderate pain.   hydrOXYzine 10 MG tablet Commonly known as:  ATARAX/VISTARIL TAKE 1 TO 3 TABLETS 3 TIMES A DAY AS NEEDED ANXIETY   meloxicam 15 MG tablet Commonly known as:  MOBIC TAKE 1 TABLET (15 MG TOTAL) BY MOUTH DAILY.   omeprazole 20 MG capsule Commonly known as:  PRILOSEC Take 1 capsule (20 mg total) by mouth daily.   primidone 250 MG tablet Commonly known as:  MYSOLINE Take 1 tablet (250 mg total) by mouth every 8 (eight) hours.   risedronate 150 MG tablet Commonly known as:  ACTONEL TAKE 1 TAB EVERY THIRTY DAYS WITH WATER ON AN EMPTY STOMACH,  NOTHING BY MOUTH OR LIE DOWN FOR 30MINS   risperiDONE 1 MG tablet Commonly known as:  RISPERDAL Take 1 mg by mouth at bedtime.   traZODone 50 MG tablet Commonly known as:  DESYREL Take 1 tablet (50 mg total) by mouth at bedtime and may repeat dose one time if needed.   valACYclovir 1000 MG tablet Commonly known as:  VALTREX Take 1 tablet (1,000 mg total) by mouth 2 (two) times daily. Then stay on one tab daily as prevention   WOMENS DAILY FORMULA PO Take by mouth.            Discharge Care Instructions        Start     Ordered   03/07/17 0000  Ambulatory referral to Gastroenterology     03/07/17 1157   03/07/17 0000  CMP14+EGFR     03/07/17 1159   03/07/17 0000  omeprazole (PRILOSEC) 20 MG capsule  Daily    Question:  Supervising Provider  Answer:  Timmothy Euler   03/07/17 1203         Objective:    BP 135/89   Pulse 76   Temp 98.7 F (37.1 C) (Oral)   Ht _0  (1.727 m)   Wt 123 lb (55.8 kg)   BMI 18.70 kg/m   No Known Allergies  Physical Exam  Constitutional: She is oriented to person, place, and time. She appears well-developed and well-nourished.  HENT:  Head: Normocephalic and atraumatic.  Eyes: Pupils are equal, round, and reactive to light. Conjunctivae and EOM are normal.  Cardiovascular: Normal rate, regular rhythm, normal heart sounds and intact distal pulses.   Pulmonary/Chest: Effort normal and breath sounds normal.  Abdominal: Soft. Bowel sounds are normal.  Neurological: She is alert and oriented to person, place, and time. She has normal reflexes.  Skin: Skin is warm and dry. No rash noted.  Psychiatric: She has a normal mood and affect. Her behavior is normal. Judgment and thought content normal.  Nursing note and vitals reviewed.   Results for orders placed or performed in visit on 02/09/17  CMP14+EGFR  Result Value Ref Range   Glucose 99 65 - 99 mg/dL   BUN 15 8 - 27 mg/dL   Creatinine, Ser 0.73 0.57 - 1.00 mg/dL   GFR calc  non Af Amer 90 >59 mL/min/1.73   GFR calc Af Amer 104 >59 mL/min/1.73   BUN/Creatinine Ratio 21 12 - 28   Sodium 134 134 - 144 mmol/L   Potassium 3.1 (L) 3.5 - 5.2 mmol/L   Chloride 90 (L) 96 - 106 mmol/L   CO2 27 20 - 29 mmol/L   Calcium 9.4 8.7 - 10.3 mg/dL   Total  Protein 7.0 6.0 - 8.5 g/dL   Albumin 4.6 3.6 - 4.8 g/dL   Globulin, Total 2.4 1.5 - 4.5 g/dL   Albumin/Globulin Ratio 1.9 1.2 - 2.2   Bilirubin Total <0.2 0.0 - 1.2 mg/dL   Alkaline Phosphatase 88 39 - 117 IU/L   AST 68 (H) 0 - 40 IU/L   ALT 117 (H) 0 - 32 IU/L      Assessment & Plan:   1. Abnormal liver function tests - CMP14+EGFR; Future  2. Diarrhea, unspecified type - Ambulatory referral to Gastroenterology  3. Hypokalemia - CMP14+EGFR; Future  4. Gastroesophageal reflux disease with esophagitis Restart omeprazole 20 mg   5. Esophageal dysphagia    Current Outpatient Prescriptions:  .  buPROPion (WELLBUTRIN XL) 300 MG 24 hr tablet, TAKE 1 TABLET EVERY DAY, Disp: 30 tablet, Rfl: 0 .  calcium carbonate (CALCIUM 600) 600 MG TABS tablet, Take 600 mg by mouth daily., Disp: , Rfl:  .  chlorthalidone (HYGROTON) 25 MG tablet, TAKE 1 TABLET (25 MG TOTAL) BY MOUTH DAILY., Disp: 90 tablet, Rfl: 0 .  citalopram (CELEXA) 40 MG tablet, TAKE 1 TABLET (40 MG TOTAL) BY MOUTH DAILY WITH BREAKFAST., Disp: , Rfl: 3 .  clonazePAM (KLONOPIN) 0.5 MG tablet, Take 0.25 mg by mouth at bedtime., Disp: , Rfl:  .  fluconazole (DIFLUCAN) 150 MG tablet, Take 1 tablet (150 mg total) by mouth daily., Disp: 14 tablet, Rfl: 2 .  gabapentin (NEURONTIN) 800 MG tablet, Take 200 mg by mouth 3 (three) times daily., Disp: , Rfl:  .  HYDROcodone-acetaminophen (NORCO/VICODIN) 5-325 MG tablet, Take 1 tablet by mouth every 6 (six) hours as needed for moderate pain., Disp: 30 tablet, Rfl: 0 .  hydrOXYzine (ATARAX/VISTARIL) 10 MG tablet, TAKE 1 TO 3 TABLETS 3 TIMES A DAY AS NEEDED ANXIETY, Disp: , Rfl: 0 .  meloxicam (MOBIC) 15 MG tablet, TAKE 1  TABLET (15 MG TOTAL) BY MOUTH DAILY., Disp: 30 tablet, Rfl: 0 .  Multiple Vitamins-Minerals (WOMENS DAILY FORMULA PO), Take by mouth., Disp: , Rfl:  .  omeprazole (PRILOSEC) 20 MG capsule, Take 1 capsule (20 mg total) by mouth daily., Disp: 30 capsule, Rfl: 11 .  primidone (MYSOLINE) 250 MG tablet, Take 1 tablet (250 mg total) by mouth every 8 (eight) hours., Disp: 90 tablet, Rfl: 0 .  risedronate (ACTONEL) 150 MG tablet, TAKE 1 TAB EVERY THIRTY DAYS WITH WATER ON AN EMPTY STOMACH, NOTHING BY MOUTH OR LIE DOWN FOR 30MINS, Disp: , Rfl: 3 .  risperiDONE (RISPERDAL) 1 MG tablet, Take 1 mg by mouth at bedtime. , Disp: , Rfl:  .  traZODone (DESYREL) 50 MG tablet, Take 1 tablet (50 mg total) by mouth at bedtime and may repeat dose one time if needed., Disp: 30 tablet, Rfl: 0 .  valACYclovir (VALTREX) 1000 MG tablet, Take 1 tablet (1,000 mg total) by mouth 2 (two) times daily. Then stay on one tab daily as prevention, Disp: 40 tablet, Rfl: 11 Continue all other maintenance medications as listed above.  Follow up plan: Return for keep follow up.  Educational handout given for [place] PA-C [place] [address]  [place], [place] [address] [phone]   03/07/2017, 1:27 PM

## 2017-03-21 ENCOUNTER — Ambulatory Visit (INDEPENDENT_AMBULATORY_CARE_PROVIDER_SITE_OTHER): Payer: Medicare Other | Admitting: Family Medicine

## 2017-03-21 ENCOUNTER — Encounter: Payer: Self-pay | Admitting: Family Medicine

## 2017-03-21 VITALS — BP 109/77 | HR 71 | Temp 97.8°F | Ht 68.0 in | Wt 122.8 lb

## 2017-03-21 DIAGNOSIS — H6122 Impacted cerumen, left ear: Secondary | ICD-10-CM | POA: Diagnosis not present

## 2017-03-21 DIAGNOSIS — M545 Low back pain: Secondary | ICD-10-CM | POA: Diagnosis not present

## 2017-03-21 DIAGNOSIS — M542 Cervicalgia: Secondary | ICD-10-CM

## 2017-03-21 MED ORDER — OXYCODONE HCL 5 MG PO TABS: 5.0000 mg | ORAL_TABLET | Freq: Four times a day (QID) | ORAL | 0 refills | Status: DC | PRN

## 2017-03-21 MED ORDER — MELOXICAM 15 MG PO TABS: 15.0000 mg | ORAL_TABLET | Freq: Every day | ORAL | 2 refills | Status: DC

## 2017-03-21 NOTE — Patient Instructions (Addendum)
Great to see you!  Stop tylenol, try oxicodone sparingly. It is ok to use meloxicam daily until you see Dr. Nelva Bush.

## 2017-03-21 NOTE — Progress Notes (Signed)
   HPI  Patient presents today for decreased hearing in the left ear as well as worsening back pain.  Patient has severe bilateral low back pain and neck pain earlier this year, February. She was treated by orthopedic surgery with epidural injections. These were very helpful but she states the pain is beginning to return. She's been taking around 3 g of Tylenol daily despite knowing that her liver enzymes are elevated. Meloxicam is somewhat helpful, however she needs a refill. She was using some old hydrocodone which helped also.  Left ear Patient with decreased hearing left ear, no pain No drainage  PMH: Smoking status noted ROS: Per HPI  Objective: BP 109/77   Pulse 71   Temp 97.8 F (36.6 C) (Oral)   Ht 5\' 8"  (1.727 m)   Wt 122 lb 12.8 oz (55.7 kg)   BMI 18.67 kg/m  Gen: NAD, alert, cooperative with exam HEENT: NCAT,  left TM obscured by cerumen, cleaned out well after irrigation revealing normal TM  CV: RRR, good S1/S2, no murmur Resp: CTABL, no wheezes, non-labored Abd: SNTND, BS present, no guarding or organomegaly Ext: No edema, warm Neuro: Alert and oriented  Assessment and plan:  # Neck pain, bilateral low back pain Worsening Patient has follow-up with orthopedics in about 1-2 weeks. Refill meloxicam Given the small amount of oxycodone, discontinue Tylenol  # Cerumen impaction Cerumen cleaned out well with irrigation today, her hearing has returned almost to normal. She will call back if hearing does not improve completely and we will refer to ENT.    Meds ordered this encounter  Medications  . oxyCODONE (ROXICODONE) 5 MG immediate release tablet    Sig: Take 1 tablet (5 mg total) by mouth every 6 (six) hours as needed for severe pain.    Dispense:  20 tablet    Refill:  0  . meloxicam (MOBIC) 15 MG tablet    Sig: Take 1 tablet (15 mg total) by mouth daily.    Dispense:  30 tablet    Refill:  Roberts, MD [facility] 03/21/2017, 5:34 PM

## 2017-03-23 ENCOUNTER — Encounter (HOSPITAL_COMMUNITY): Payer: Self-pay

## 2017-03-23 ENCOUNTER — Encounter (HOSPITAL_COMMUNITY)
Admission: RE | Admit: 2017-03-23 | Discharge: 2017-03-23 | Disposition: A | Discharge status: Home / Self Care | Payer: Medicare Other | Source: Ambulatory Visit | Attending: Psychiatry | Admitting: Psychiatry

## 2017-03-23 DIAGNOSIS — G35 Multiple sclerosis: Secondary | ICD-10-CM | POA: Diagnosis not present

## 2017-03-23 MED ORDER — SODIUM CHLORIDE 0.9 % IV SOLN
INTRAVENOUS | Status: AC
  Administered 2017-03-23: 09:00:00 via INTRAVENOUS

## 2017-03-23 MED ORDER — SODIUM CHLORIDE 0.9 % IV SOLN
300.0000 mg | Freq: Once | INTRAVENOUS | Status: AC
  Administered 2017-03-23: 300 mg via INTRAVENOUS
  Filled 2017-03-23: qty 10

## 2017-03-23 MED ORDER — DIPHENHYDRAMINE HCL 50 MG/ML IJ SOLN
25.0000 mg | INTRAMUSCULAR | Status: DC
  Administered 2017-03-23: 25 mg via INTRAVENOUS
  Filled 2017-03-23: qty 1

## 2017-03-23 MED ORDER — ACETAMINOPHEN 500 MG PO TABS
1000.0000 mg | ORAL_TABLET | ORAL | Status: DC
  Administered 2017-03-23: 1000 mg via ORAL
  Filled 2017-03-23: qty 2

## 2017-03-23 MED ORDER — METHYLPREDNISOLONE SODIUM SUCC 125 MG IJ SOLR
100.0000 mg | INTRAMUSCULAR | Status: DC
  Administered 2017-03-23: 100 mg via INTRAVENOUS
  Filled 2017-03-23: qty 2

## 2017-03-23 NOTE — Progress Notes (Signed)
Pt had first dose of Ocrevus today.  Pt tolerated well and had no reaction.  Pt was up several times to urinate during infusion.  Pt husband stayed with her today and brought the pt lunch as well.  Pt was oriented to infusion process before treatment today.  Informed of what to expect with each visit.  Premeds were given before her Ocrevus as ordered.  Pt was given her next appointment for 04-07-17 at 0730.  Pt was d/c ambulatory to lobby with her husband.  Pt had no dizziness or lightheadedness.  Pt states I feel fine.

## 2017-03-23 NOTE — Discharge Instructions (Signed)
Ocrelizumab injection What is this medicine? OCRELIZUMAB (ok re LIZ ue mab) treats multiple sclerosis. It helps to decrease the number of multiple sclerosis relapses. It is not a cure. This medicine may be used for other purposes; ask your health care provider or pharmacist if you have questions. COMMON BRAND NAME(S): OCREVUS What should I tell my health care provider before I take this medicine? They need to know if you have any of these conditions: -cancer -hepatitis B infection -other infection (especially a virus infection such as chickenpox, cold sores, or herpes) -an unusual or allergic reaction to ocrelizumab, other medicines, foods, dyes or preservatives -pregnant or trying to get pregnant -breast-feeding How should I use this medicine? This medicine is for infusion into a vein. It is given by a health care professional in a hospital or clinic setting. Talk to your pediatrician regarding the use of this medicine in children. Special care may be needed. Overdosage: If you think you have taken too much of this medicine contact a poison control center or emergency room at once. NOTE: This medicine is only for you. Do not share this medicine with others. What if I miss a dose? Keep appointments for follow-up doses as directed. It is important not to miss your dose. Call your doctor or health care professional if you are unable to keep an appointment. What may interact with this medicine? -alemtuzumab -daclizumab -dimethyl fumarate -fingolimod -glatiramer -interferon beta -live virus vaccines -mitoxantrone -natalizumab -peginterferon beta -rituximab -steroid medicines like prednisone or cortisone -teriflunomide This list may not describe all possible interactions. Give your health care provider a list of all the medicines, herbs, non-prescription drugs, or dietary supplements you use. Also tell them if you smoke, drink alcohol, or use illegal drugs. Some items may interact with  your medicine. What should I watch for while using this medicine? Tell your doctor or healthcare professional if your symptoms do not start to get better or if they get worse. This medicine can cause serious allergic reactions. To reduce your risk you may need to take medicine before treatment with this medicine. Take your medicine as directed. Women should inform their doctor if they wish to become pregnant or think they might be pregnant. There is a potential for serious side effects to an unborn child. Talk to your health care professional or pharmacist for more information. Female patients should use effective birth control methods while receiving this medicine and for 6 months after the last dose. Call your doctor or health care professional for advice if you get a fever, chills or sore throat, or other symptoms of a cold or flu. Do not treat yourself. This drug decreases your body's ability to fight infections. Try to avoid being around people who are sick. If you have a hepatitis B infection or a history of a hepatitis B infection, talk to your doctor. The symptoms of hepatitis B may get worse if you take this medicine. In some patients, this medicine may cause a serious brain infection that may cause death. If you have any problems seeing, thinking, speaking, walking, or standing, tell your doctor right away. If you cannot reach your doctor, urgently seek other source of medical care. This medicine can decrease the response to a vaccine. If you need to get vaccinated, tell your healthcare professional if you have received this medicine. Extra booster doses may be needed. Talk to your doctor to see if a different vaccination schedule is needed. Talk to your doctor about your risk of cancer.   You may be more at risk for certain types of cancers if you take this medicine. What side effects may I notice from receiving this medicine? Side effects that you should report to your doctor or health care  professional as soon as possible: -allergic reactions like skin rash, itching or hives, swelling of the face, lips, or tongue -breathing problems -facial flushing -fast, irregular heartbeat -lump or soreness in the breast -signs and symptoms of herpes such as cold sore, shingles, or genital sores -signs and symptoms of infection like fever or chills, cough, sore throat, pain or trouble passing urine -signs and symptoms of low blood pressure like dizziness; feeling faint or lightheaded, falls; unusually weak or tired -signs and symptoms of progressive multifocal leukoencephalopathy (PML) like changes in vision; clumsiness; confusion; personality changes; weakness on one side of the body -swelling of the ankles, feet, hands Side effects that usually do not require medical attention (report these to your doctor or health care professional if they continue or are bothersome): -back pain -depressed mood -diarrhea -pain, redness, or irritation at site where injected This list may not describe all possible side effects. Call your doctor for medical advice about side effects. You may report side effects to FDA at 1-800-FDA-1088. Where should I keep my medicine? This drug is given in a hospital or clinic and will not be stored at home. NOTE: This sheet is a summary. It may not cover all possible information. If you have questions about this medicine, talk to your doctor, pharmacist, or health care provider.  2018 Elsevier/Gold Standard (2015-09-29 09:40:25)  

## 2017-03-29 DIAGNOSIS — F411 Generalized anxiety disorder: Secondary | ICD-10-CM | POA: Diagnosis not present

## 2017-04-03 ENCOUNTER — Ambulatory Visit (AMBULATORY_SURGERY_CENTER): Payer: Self-pay | Admitting: *Deleted

## 2017-04-03 VITALS — Ht 67.0 in | Wt 123.0 lb

## 2017-04-03 DIAGNOSIS — Z1211 Encounter for screening for malignant neoplasm of colon: Secondary | ICD-10-CM

## 2017-04-03 MED ORDER — NA SULFATE-K SULFATE-MG SULF 17.5-3.13-1.6 GM/177ML PO SOLN: ORAL | 0 refills | Status: DC

## 2017-04-03 NOTE — Progress Notes (Signed)
Patient denies any allergies to eggs or soy. Patient denies any problems with anesthesia/sedation. Patient denies any oxygen use at home. Patient denies taking any diet/weight loss medications or blood thinners. No email per patient.

## 2017-04-04 DIAGNOSIS — M47816 Spondylosis without myelopathy or radiculopathy, lumbar region: Secondary | ICD-10-CM | POA: Diagnosis not present

## 2017-04-04 DIAGNOSIS — G894 Chronic pain syndrome: Secondary | ICD-10-CM | POA: Diagnosis not present

## 2017-04-05 ENCOUNTER — Telehealth: Payer: Self-pay | Admitting: Physician Assistant

## 2017-04-05 NOTE — Telephone Encounter (Signed)
Patient needs a refill on her oxycodone and can not be seen on Friday due to having infusion. Patient does have appointment on 11/08

## 2017-04-06 MED ORDER — OXYCODONE HCL 5 MG PO TABS: 5.0000 mg | ORAL_TABLET | Freq: Four times a day (QID) | ORAL | 0 refills | Status: DC | PRN

## 2017-04-06 NOTE — Telephone Encounter (Signed)
Patient aware rx is ready to be picked up 

## 2017-04-07 ENCOUNTER — Encounter (HOSPITAL_COMMUNITY): Payer: Self-pay

## 2017-04-07 ENCOUNTER — Encounter (HOSPITAL_COMMUNITY)
Admission: RE | Admit: 2017-04-07 | Discharge: 2017-04-07 | Disposition: A | Discharge status: Home / Self Care | Payer: Medicare Other | Source: Ambulatory Visit | Attending: Psychiatry | Admitting: Psychiatry

## 2017-04-07 DIAGNOSIS — G35 Multiple sclerosis: Secondary | ICD-10-CM | POA: Diagnosis not present

## 2017-04-07 MED ORDER — SODIUM CHLORIDE 0.9 % IV SOLN
INTRAVENOUS | Status: DC
  Administered 2017-04-07: 08:00:00 via INTRAVENOUS

## 2017-04-07 MED ORDER — METHYLPREDNISOLONE SODIUM SUCC 125 MG IJ SOLR
100.0000 mg | Freq: Once | INTRAMUSCULAR | Status: AC
  Administered 2017-04-07: 100 mg via INTRAVENOUS
  Filled 2017-04-07: qty 2

## 2017-04-07 MED ORDER — SODIUM CHLORIDE 0.9 % IV SOLN
300.0000 mg | Freq: Once | INTRAVENOUS | Status: AC
  Administered 2017-04-07: 300 mg via INTRAVENOUS
  Filled 2017-04-07: qty 10

## 2017-04-07 MED ORDER — ACETAMINOPHEN 500 MG PO TABS
1000.0000 mg | ORAL_TABLET | Freq: Once | ORAL | Status: AC
  Administered 2017-04-07: 1000 mg via ORAL
  Filled 2017-04-07: qty 2

## 2017-04-07 MED ORDER — DIPHENHYDRAMINE HCL 50 MG/ML IJ SOLN
25.0000 mg | Freq: Once | INTRAMUSCULAR | Status: AC
  Administered 2017-04-07: 25 mg via INTRAVENOUS
  Filled 2017-04-07: qty 1

## 2017-04-07 NOTE — Progress Notes (Addendum)
2nd Ocrevus infusion today. Pt also stayed for the 1 hour observation when her med was complete.  Pt tolerated well.  Next dose will be due in 6 months.  Pt informed to contact her MD about location of next infusion because this department may be moving but the final details of that are still in the works and unclear.  Pt voiced understanding.

## 2017-04-11 DIAGNOSIS — Z23 Encounter for immunization: Secondary | ICD-10-CM | POA: Diagnosis not present

## 2017-04-12 DIAGNOSIS — S72042D Displaced fracture of base of neck of left femur, subsequent encounter for closed fracture with routine healing: Secondary | ICD-10-CM | POA: Diagnosis not present

## 2017-04-12 DIAGNOSIS — M7062 Trochanteric bursitis, left hip: Secondary | ICD-10-CM | POA: Diagnosis not present

## 2017-04-17 ENCOUNTER — Ambulatory Visit (AMBULATORY_SURGERY_CENTER): Payer: Medicare Other | Admitting: Gastroenterology

## 2017-04-17 ENCOUNTER — Encounter: Payer: Self-pay | Admitting: Gastroenterology

## 2017-04-17 VITALS — BP 124/84 | HR 62 | Temp 97.7°F | Resp 18 | Ht 68.0 in | Wt 122.0 lb

## 2017-04-17 DIAGNOSIS — Z1211 Encounter for screening for malignant neoplasm of colon: Secondary | ICD-10-CM | POA: Diagnosis not present

## 2017-04-17 DIAGNOSIS — Z1212 Encounter for screening for malignant neoplasm of rectum: Secondary | ICD-10-CM | POA: Diagnosis not present

## 2017-04-17 DIAGNOSIS — I10 Essential (primary) hypertension: Secondary | ICD-10-CM | POA: Diagnosis not present

## 2017-04-17 DIAGNOSIS — D125 Benign neoplasm of sigmoid colon: Secondary | ICD-10-CM

## 2017-04-17 MED ORDER — SODIUM CHLORIDE 0.9 % IV SOLN: 500.0000 mL | INTRAVENOUS | Status: DC

## 2017-04-17 NOTE — Progress Notes (Signed)
Report given to PACU, vss 

## 2017-04-17 NOTE — Progress Notes (Signed)
Pt's states no medical or surgical changes since previsit or office visit. 

## 2017-04-17 NOTE — Progress Notes (Signed)
Called to room to assist during endoscopic procedure.  Patient ID and intended procedure confirmed with present staff. Received instructions for my participation in the procedure from the performing physician.  

## 2017-04-17 NOTE — Patient Instructions (Signed)
YOU HAD AN ENDOSCOPIC PROCEDURE TODAY AT [facility]:   Refer to the procedure report that was given to you for any specific questions about what was found during the examination.  If the procedure report does not answer your questions, please call your gastroenterologist to clarify.  If you requested that your care partner not be given the details of your procedure findings, then the procedure report has been included in a sealed envelope for you to review at your convenience later.  YOU SHOULD EXPECT: Some feelings of bloating in the abdomen. Passage of more gas than usual.  Walking can help get rid of the air that was put into your GI tract during the procedure and reduce the bloating. If you had a lower endoscopy (such as a colonoscopy or flexible sigmoidoscopy) you may notice spotting of blood in your stool or on the toilet paper. If you underwent a bowel prep for your procedure, you may not have a normal bowel movement for a few days.  Please Note:  You might notice some irritation and congestion in your nose or some drainage.  This is from the oxygen used during your procedure.  There is no need for concern and it should clear up in a day or so.  SYMPTOMS TO REPORT IMMEDIATELY:   Following lower endoscopy (colonoscopy or flexible sigmoidoscopy):  Excessive amounts of blood in the stool  Significant tenderness or worsening of abdominal pains  Swelling of the abdomen that is new, acute  Fever of 100F or higher  For urgent or emergent issues, a gastroenterologist can be reached at any hour by calling [phone].   DIET:  We do recommend a small meal at first, but then you may proceed to your regular diet.  Drink plenty of fluids but you should avoid alcoholic beverages for 24 hours.  ACTIVITY:  You should plan to take it easy for the rest of today and you should NOT DRIVE or use heavy machinery until tomorrow (because of the sedation medicines used during the test).     FOLLOW UP: Our staff will call the number listed on your records the next business day following your procedure to check on you and address any questions or concerns that you may have regarding the information given to you following your procedure. If we do not reach you, we will leave a message.  However, if you are feeling well and you are not experiencing any problems, there is no need to return our call.  We will assume that you have returned to your regular daily activities without incident.  If any biopsies were taken you will be contacted by phone or by letter within the next 1-3 weeks.  Please call us at [phone] if you have not heard about the biopsies in 3 weeks.   Await for biopsy results to determined next repeat Colonoscopy screening Polyps (handout given) Hemorrhoids (handout given) .[place] KitchenNo ibuprofen, naproxen or other non-steroidal anti-inflammatory drugs for 2 weeks after polyp removal. Tylenol okay if needed.   SIGNATURES/CONFIDENTIALITY: You and/or your care partner have signed paperwork which will be entered into your electronic medical record.  These signatures attest to the fact that that the information above on your After Visit Summary has been reviewed and is understood.  Full responsibility of the confidentiality of this discharge information lies with you and/or your care-partner.

## 2017-04-17 NOTE — Op Note (Signed)
[place] Patient Name: Angel Maddox Procedure Date: 04/17/2017 11:22 AM MRN: [id number] Endoscopist: Remo Lipps P. Armbruster MD, MD Age: 61 Referring MD:  Date of Birth: 02-17-1956 Gender: Female Account #: [id number] Procedure:                Colonoscopy Indications:              Screening for colorectal malignant neoplasm Medicines:                Monitored Anesthesia Care Procedure:                Pre-Anesthesia Assessment:                           - Prior to the procedure, a History and Physical                            was performed, and patient medications and                            allergies were reviewed. The patient's tolerance of                            previous anesthesia was also reviewed. The risks                            and benefits of the procedure and the sedation                            options and risks were discussed with the patient.                            All questions were answered, and informed consent                            was obtained. Prior Anticoagulants: The patient has                            taken no previous anticoagulant or antiplatelet                            agents. ASA Grade Assessment: II - A patient with                            mild systemic disease. After reviewing the risks                            and benefits, the patient was deemed in                            satisfactory condition to undergo the procedure.                           After obtaining informed consent, the colonoscope  was passed under direct vision. Throughout the                            procedure, the patient's blood pressure, pulse, and                            oxygen saturations were monitored continuously. The                            Model [id number]-[id number] scope was introduced                            through the anus and advanced to the the cecum,   identified by appendiceal orifice and ileocecal                            valve. The colonoscopy was performed without                            difficulty. The patient tolerated the procedure                            well. The quality of the bowel preparation was                            good. The ileocecal valve, appendiceal orifice, and                            rectum were photographed. Scope In: 11:31:18 AM Scope Out: 11:47:16 AM Scope Withdrawal Time: 0 hours 9 minutes 17 seconds  Total Procedure Duration: 0 hours 15 minutes 58 seconds  Findings:                 The perianal and digital rectal examinations were                            normal.                           The colon was tortuous.                           A 6 mm polyp was found in the sigmoid colon. The                            polyp was sessile. The polyp was removed with a                            cold snare. Resection and retrieval were complete.                           Internal hemorrhoids were found during retroflexion.                           The exam was otherwise without abnormality. Complications:  No immediate complications. Estimated blood loss:                            Minimal. Estimated Blood Loss:     Estimated blood loss was minimal. Impression:               - Tortuous colon.                           - One 6 mm polyp in the sigmoid colon, removed with                            a cold snare. Resected and retrieved.                           - Internal hemorrhoids.                           - The examination was otherwise normal. Recommendation:           - Patient has a contact number available for                            emergencies. The signs and symptoms of potential                            delayed complications were discussed with the                            patient. Return to normal activities tomorrow.                            Written discharge  instructions were provided to the                            patient.                           - Resume previous diet.                           - Continue present medications.                           - Await pathology results.                           - Repeat colonoscopy is recommended for                            surveillance. The colonoscopy date will be                            determined after pathology results from today's                            exam become available for review.                           -  No ibuprofen, naproxen, or other non-steroidal                            anti-inflammatory drugs for 2 weeks after polyp                            removal. Remo Lipps P. Armbruster MD, MD 04/17/2017 11:50:25 AM This report has been signed electronically.

## 2017-04-18 ENCOUNTER — Telehealth: Payer: Self-pay

## 2017-04-18 NOTE — Telephone Encounter (Signed)
  Follow up Call-  Call back number 04/17/2017  Post procedure Call Back phone  # [phone]  Permission to leave phone message Yes  Some recent data might be hidden     Patient questions:  Do you have a fever, pain , or abdominal swelling? No. Pain Score  0 *  Have you tolerated food without any problems? Yes.    Have you been able to return to your normal activities? Yes.    Do you have any questions about your discharge instructions: Diet   No. Medications  No. Follow up visit  No.  Do you have questions or concerns about your Care? No.  Actions: * If pain score is 4 or above: No action needed, pain <4.  Per pt she felt weak yesterday.  She reported she did not eat much, she did not have much appetite.  I encouraged her to eat and to call us back if she does not get back to normal today. maw

## 2017-04-20 ENCOUNTER — Encounter: Payer: Self-pay | Admitting: Gastroenterology

## 2017-04-20 DIAGNOSIS — M47812 Spondylosis without myelopathy or radiculopathy, cervical region: Secondary | ICD-10-CM | POA: Diagnosis not present

## 2017-04-24 ENCOUNTER — Telehealth: Payer: Self-pay | Admitting: Physician Assistant

## 2017-04-24 MED ORDER — OXYCODONE HCL 5 MG PO TABS: 5.0000 mg | ORAL_TABLET | Freq: Four times a day (QID) | ORAL | 0 refills | Status: DC | PRN

## 2017-04-24 NOTE — Telephone Encounter (Signed)
She is requesting oxycodone

## 2017-04-24 NOTE — Telephone Encounter (Signed)
Patient aware rx is ready to be picked up 

## 2017-04-24 NOTE — Telephone Encounter (Signed)
Yes, which one?

## 2017-04-24 NOTE — Telephone Encounter (Signed)
Patient is wanting to see if you can refill her pain medications until appointment 11/08

## 2017-04-26 ENCOUNTER — Ambulatory Visit: Payer: Medicare Other | Admitting: Family Medicine

## 2017-04-27 ENCOUNTER — Telehealth: Payer: Self-pay | Admitting: Physician Assistant

## 2017-04-27 DIAGNOSIS — R7989 Other specified abnormal findings of blood chemistry: Secondary | ICD-10-CM

## 2017-04-27 DIAGNOSIS — R945 Abnormal results of liver function studies: Principal | ICD-10-CM

## 2017-04-27 NOTE — Telephone Encounter (Signed)
There is an order already placed, so yes, she needs to come.

## 2017-04-28 NOTE — Telephone Encounter (Signed)
Patient aware and order replaced since last order had expired.

## 2017-05-01 ENCOUNTER — Other Ambulatory Visit: Payer: Medicare Other

## 2017-05-01 DIAGNOSIS — R7989 Other specified abnormal findings of blood chemistry: Secondary | ICD-10-CM

## 2017-05-01 DIAGNOSIS — R945 Abnormal results of liver function studies: Principal | ICD-10-CM

## 2017-05-02 DIAGNOSIS — H524 Presbyopia: Secondary | ICD-10-CM | POA: Diagnosis not present

## 2017-05-02 DIAGNOSIS — H5213 Myopia, bilateral: Secondary | ICD-10-CM | POA: Diagnosis not present

## 2017-05-02 DIAGNOSIS — H52223 Regular astigmatism, bilateral: Secondary | ICD-10-CM | POA: Diagnosis not present

## 2017-05-02 DIAGNOSIS — H04123 Dry eye syndrome of bilateral lacrimal glands: Secondary | ICD-10-CM | POA: Diagnosis not present

## 2017-05-02 DIAGNOSIS — H2513 Age-related nuclear cataract, bilateral: Secondary | ICD-10-CM | POA: Diagnosis not present

## 2017-05-02 LAB — CMP14+EGFR
ALT: 58 IU/L — AB (ref 0–32)
AST: 51 IU/L — AB (ref 0–40)
Albumin/Globulin Ratio: 1.9 (ref 1.2–2.2)
Albumin: 4.3 g/dL (ref 3.6–4.8)
Alkaline Phosphatase: 93 IU/L (ref 39–117)
BUN/Creatinine Ratio: 13 (ref 12–28)
BUN: 9 mg/dL (ref 8–27)
Bilirubin Total: 0.3 mg/dL (ref 0.0–1.2)
CALCIUM: 9.4 mg/dL (ref 8.7–10.3)
CHLORIDE: 85 mmol/L — AB (ref 96–106)
CO2: 28 mmol/L (ref 20–29)
Creatinine, Ser: 0.71 mg/dL (ref 0.57–1.00)
GFR, EST AFRICAN AMERICAN: 106 mL/min/{1.73_m2} (ref >59)
GFR, EST NON AFRICAN AMERICAN: 92 mL/min/{1.73_m2} (ref >59)
GLUCOSE: 86 mg/dL (ref 65–99)
Globulin, Total: 2.3 g/dL (ref 1.5–4.5)
Potassium: 3.5 mmol/L (ref 3.5–5.2)
Sodium: 130 mmol/L — ABNORMAL LOW (ref 134–144)
TOTAL PROTEIN: 6.6 g/dL (ref 6.0–8.5)

## 2017-05-04 ENCOUNTER — Ambulatory Visit (INDEPENDENT_AMBULATORY_CARE_PROVIDER_SITE_OTHER): Payer: Medicare Other | Admitting: Physician Assistant

## 2017-05-04 ENCOUNTER — Encounter: Payer: Self-pay | Admitting: Physician Assistant

## 2017-05-04 VITALS — BP 117/84 | HR 72 | Temp 97.8°F | Ht 68.0 in | Wt 125.0 lb

## 2017-05-04 DIAGNOSIS — G894 Chronic pain syndrome: Secondary | ICD-10-CM | POA: Diagnosis not present

## 2017-05-04 DIAGNOSIS — G35 Multiple sclerosis: Secondary | ICD-10-CM

## 2017-05-04 DIAGNOSIS — F332 Major depressive disorder, recurrent severe without psychotic features: Secondary | ICD-10-CM

## 2017-05-04 DIAGNOSIS — M47812 Spondylosis without myelopathy or radiculopathy, cervical region: Secondary | ICD-10-CM | POA: Diagnosis not present

## 2017-05-04 DIAGNOSIS — F319 Bipolar disorder, unspecified: Secondary | ICD-10-CM

## 2017-05-04 DIAGNOSIS — M5136 Other intervertebral disc degeneration, lumbar region: Secondary | ICD-10-CM | POA: Diagnosis not present

## 2017-05-04 DIAGNOSIS — I10 Essential (primary) hypertension: Secondary | ICD-10-CM | POA: Diagnosis not present

## 2017-05-04 MED ORDER — OXYCODONE HCL 5 MG PO TABS: 5.0000 mg | ORAL_TABLET | Freq: Four times a day (QID) | ORAL | 0 refills | Status: DC | PRN

## 2017-05-04 NOTE — Patient Instructions (Signed)
In a few days you may receive a survey in the mail or online from [employer] regarding your visit with us today. Please take a moment to fill this out. Your feedback is very important to our whole office. It can help us better understand your needs as well as improve your experience and satisfaction. Thank you for taking your time to complete it. We care about you.  Britain Anagnos, PA-C  

## 2017-05-04 NOTE — Progress Notes (Signed)
BP 117/84   Pulse 72   Temp 97.8 F (36.6 C) (Oral)   Ht _0  (1.727 m)   Wt 125 lb (56.7 kg)   BMI 19.01 kg/m    Subjective:    Patient ID: Angel Maddox, female    DOB: [birth year], 61 y.o.   MRN: [id number]  HPI: Angel Maddox is a 61 y.o. female presenting on 05/04/2017 for No chief complaint on file.  This patient comes in for periodic recheck on medications and conditions including depression with anxiety, multiple sclerosis, chronic back pain, essential hypertension.  Overall the patient is doing fairly well and stable.  She will be going back to neurology soon she has tried a new medication and can tell some difference.  It was very good for about for 5 days after her second infusion.  She is hoping that it will continue to improve as she takes it.  She is also still being followed by psychiatry for her depression and insomnia.  She states the insomnia medicine that they are using at this point is giving her a few good hours but she is wide awake at 3 AM and cannot go back to sleep.  All medications are reviewed today. There are no reports of any problems with the medications. All of the medical conditions are reviewed and updated.  Lab work is reviewed and will be ordered as medically necessary. There are no new problems reported with today's visit.   Relevant past medical, surgical, family and social history reviewed and updated as indicated. Allergies and medications reviewed and updated.  Past Medical History:  Diagnosis Date  . Anxiety   . Bipolar 1 disorder ([place])   . Chronic kidney disease    kidney stone  . Depression   . Elevated liver enzymes   . H/O hiatal hernia   . Hip fracture ([place]) 2017   left  . Hypertension   . Multiple sclerosis ([place])     Had for 15 years  . Tremors of nervous system     Past Surgical History:  Procedure Laterality Date  . COLONOSCOPY  2008   Dr.Kaplan  . LIPOMA EXCISION    . TUBAL LIGATION      Review of Systems    Constitutional: Negative for activity change, fatigue and fever.  HENT: Negative.   Eyes: Negative.   Respiratory: Negative.  Negative for cough.   Cardiovascular: Negative.  Negative for chest pain.  Gastrointestinal: Negative.  Negative for abdominal pain.  Endocrine: Negative.   Genitourinary: Negative.  Negative for dysuria.  Musculoskeletal: Positive for arthralgias, back pain and gait problem.  Skin: Negative.   Neurological: Positive for weakness and numbness.  Psychiatric/Behavioral: Positive for dysphoric mood and sleep disturbance. Negative for suicidal ideas. The patient is nervous/anxious.     Allergies as of 05/04/2017   No Known Allergies     Medication List        Accurate as of 05/04/17  2:07 PM. Always use your most recent med list.          buPROPion 300 MG 24 hr tablet Commonly known as:  WELLBUTRIN XL TAKE 1 TABLET EVERY DAY   CALCIUM 600 600 MG Tabs tablet Generic drug:  calcium carbonate Take 600 mg by mouth daily.   chlorthalidone 25 MG tablet Commonly known as:  HYGROTON TAKE 1 TABLET (25 MG TOTAL) BY MOUTH DAILY.   citalopram 40 MG tablet Commonly known as:  CELEXA TAKE 1 TABLET (40 MG TOTAL)  BY MOUTH DAILY WITH BREAKFAST.   clonazePAM 0.5 MG tablet Commonly known as:  KLONOPIN Take 0.25 mg by mouth at bedtime.   gabapentin 800 MG tablet Commonly known as:  NEURONTIN Take 200 mg by mouth 3 (three) times daily.   HYDROcodone-acetaminophen 5-325 MG tablet Commonly known as:  NORCO/VICODIN Take 1 tablet by mouth as needed.   hydrOXYzine 10 MG tablet Commonly known as:  ATARAX/VISTARIL TAKE 1 TO 3 TABLETS 3 TIMES A DAY AS NEEDED ANXIETY   meloxicam 15 MG tablet Commonly known as:  MOBIC Take 1 tablet (15 mg total) by mouth daily.   OCREVUS IV Inject 600 mg into the vein. 300 mg on 9/27 and 04/07/17.  Then 600 mg every 6 months.   omeprazole 20 MG capsule Commonly known as:  PRILOSEC Take 1 capsule (20 mg total) by mouth daily.    oxyCODONE 5 MG immediate release tablet Commonly known as:  ROXICODONE Take 1 tablet (5 mg total) every 6 (six) hours as needed by mouth for severe pain.   primidone 250 MG tablet Commonly known as:  MYSOLINE Take 1 tablet (250 mg total) by mouth every 8 (eight) hours.   risedronate 150 MG tablet Commonly known as:  ACTONEL TAKE 1 TAB EVERY THIRTY DAYS WITH WATER ON AN EMPTY STOMACH, NOTHING BY MOUTH OR LIE DOWN FOR 30MINS   risperiDONE 1 MG tablet Commonly known as:  RISPERDAL Take 1 mg by mouth at bedtime.   traZODone 50 MG tablet Commonly known as:  DESYREL Take 1 tablet (50 mg total) by mouth at bedtime and may repeat dose one time if needed.   valACYclovir 1000 MG tablet Commonly known as:  VALTREX Take 1 tablet (1,000 mg total) by mouth 2 (two) times daily. Then stay on one tab daily as prevention   WOMENS DAILY FORMULA PO Take by mouth.          Objective:    BP 117/84   Pulse 72   Temp 97.8 F (36.6 C) (Oral)   Ht _0  (1.727 m)   Wt 125 lb (56.7 kg)   BMI 19.01 kg/m   No Known Allergies  Physical Exam  Constitutional: She is oriented to person, place, and time. She appears well-developed and well-nourished.  HENT:  Head: Normocephalic and atraumatic.  Right Ear: Tympanic membrane, external ear and ear canal normal.  Left Ear: Tympanic membrane, external ear and ear canal normal.  Nose: Nose normal. No rhinorrhea.  Mouth/Throat: Oropharynx is clear and moist and mucous membranes are normal. No oropharyngeal exudate or posterior oropharyngeal erythema.  Eyes: Conjunctivae and EOM are normal. Pupils are equal, round, and reactive to light.  Neck: Normal range of motion. Neck supple.  Cardiovascular: Normal rate, regular rhythm, normal heart sounds and intact distal pulses.  Pulmonary/Chest: Effort normal and breath sounds normal.  Abdominal: Soft. Bowel sounds are normal.  Neurological: She is alert and oriented to person, place, and time. She has  normal reflexes.  Skin: Skin is warm and dry. No rash noted.  Psychiatric: She has a normal mood and affect. Her behavior is normal. Judgment and thought content normal.  Nursing note and vitals reviewed.   Results for orders placed or performed in visit on 05/01/17  CMP14+EGFR  Result Value Ref Range   Glucose 86 65 - 99 mg/dL   BUN 9 8 - 27 mg/dL   Creatinine, Ser 0.71 0.57 - 1.00 mg/dL   GFR calc non Af Amer 92 >59 mL/min/1.73  GFR calc Af Amer 106 >59 mL/min/1.73   BUN/Creatinine Ratio 13 12 - 28   Sodium 130 (L) 134 - 144 mmol/L   Potassium 3.5 3.5 - 5.2 mmol/L   Chloride 85 (L) 96 - 106 mmol/L   CO2 28 20 - 29 mmol/L   Calcium 9.4 8.7 - 10.3 mg/dL   Total Protein 6.6 6.0 - 8.5 g/dL   Albumin 4.3 3.6 - 4.8 g/dL   Globulin, Total 2.3 1.5 - 4.5 g/dL   Albumin/Globulin Ratio 1.9 1.2 - 2.2   Bilirubin Total 0.3 0.0 - 1.2 mg/dL   Alkaline Phosphatase 93 39 - 117 IU/L   AST 51 (H) 0 - 40 IU/L   ALT 58 (H) 0 - 32 IU/L      Assessment & Plan:   1. Essential hypertension  2. MS (multiple sclerosis) ([place])  3. Severe episode of recurrent major depressive disorder, without psychotic features ([place])  4. Bipolar 1 disorder, depressed ([place])  Call if there is any worsening of symptoms Continue with neurology and psychiatry for the above diagnoses.  Current Outpatient Medications:  .  buPROPion (WELLBUTRIN XL) 300 MG 24 hr tablet, TAKE 1 TABLET EVERY DAY, Disp: 30 tablet, Rfl: 0 .  calcium carbonate (CALCIUM 600) 600 MG TABS tablet, Take 600 mg by mouth daily., Disp: , Rfl:  .  chlorthalidone (HYGROTON) 25 MG tablet, TAKE 1 TABLET (25 MG TOTAL) BY MOUTH DAILY., Disp: 90 tablet, Rfl: 0 .  citalopram (CELEXA) 40 MG tablet, TAKE 1 TABLET (40 MG TOTAL) BY MOUTH DAILY WITH BREAKFAST., Disp: , Rfl: 3 .  clonazePAM (KLONOPIN) 0.5 MG tablet, Take 0.25 mg by mouth at bedtime., Disp: , Rfl:  .  gabapentin (NEURONTIN) 800 MG tablet, Take 200 mg by mouth 3 (three) times daily., Disp: , Rfl:    .  HYDROcodone-acetaminophen (NORCO/VICODIN) 5-325 MG tablet, Take 1 tablet by mouth as needed., Disp: , Rfl:  .  hydrOXYzine (ATARAX/VISTARIL) 10 MG tablet, TAKE 1 TO 3 TABLETS 3 TIMES A DAY AS NEEDED ANXIETY, Disp: , Rfl: 0 .  meloxicam (MOBIC) 15 MG tablet, Take 1 tablet (15 mg total) by mouth daily., Disp: 30 tablet, Rfl: 2 .  Multiple Vitamins-Minerals (WOMENS DAILY FORMULA PO), Take by mouth., Disp: , Rfl:  .  Ocrelizumab (OCREVUS IV), Inject 600 mg into the vein. 300 mg on 9/27 and 04/07/17.  Then 600 mg every 6 months., Disp: , Rfl:  .  omeprazole (PRILOSEC) 20 MG capsule, Take 1 capsule (20 mg total) by mouth daily., Disp: 30 capsule, Rfl: 11 .  oxyCODONE (ROXICODONE) 5 MG immediate release tablet, Take 1 tablet (5 mg total) every 6 (six) hours as needed by mouth for severe pain., Disp: 90 tablet, Rfl: 0 .  primidone (MYSOLINE) 250 MG tablet, Take 1 tablet (250 mg total) by mouth every 8 (eight) hours., Disp: 90 tablet, Rfl: 0 .  risedronate (ACTONEL) 150 MG tablet, TAKE 1 TAB EVERY THIRTY DAYS WITH WATER ON AN EMPTY STOMACH, NOTHING BY MOUTH OR LIE DOWN FOR 30MINS, Disp: , Rfl: 3 .  risperiDONE (RISPERDAL) 1 MG tablet, Take 1 mg by mouth at bedtime. , Disp: , Rfl:  .  traZODone (DESYREL) 50 MG tablet, Take 1 tablet (50 mg total) by mouth at bedtime and may repeat dose one time if needed., Disp: 30 tablet, Rfl: 0 .  valACYclovir (VALTREX) 1000 MG tablet, Take 1 tablet (1,000 mg total) by mouth 2 (two) times daily. Then stay on one tab daily as prevention,  Disp: 40 tablet, Rfl: 11 Continue all other maintenance medications as listed above.  Follow up plan: Return in about 3 months (around 08/04/2017) for recheck.  Educational handout given for [place] PA-C [place] [address]  [place], [place] [address] [phone]   05/04/2017, 2:07 PM

## 2017-05-08 ENCOUNTER — Other Ambulatory Visit: Payer: Self-pay | Admitting: Family Medicine

## 2017-05-09 DIAGNOSIS — M47812 Spondylosis without myelopathy or radiculopathy, cervical region: Secondary | ICD-10-CM | POA: Diagnosis not present

## 2017-05-11 DIAGNOSIS — F411 Generalized anxiety disorder: Secondary | ICD-10-CM | POA: Diagnosis not present

## 2017-05-24 DIAGNOSIS — M47816 Spondylosis without myelopathy or radiculopathy, lumbar region: Secondary | ICD-10-CM | POA: Diagnosis not present

## 2017-05-24 DIAGNOSIS — M5136 Other intervertebral disc degeneration, lumbar region: Secondary | ICD-10-CM | POA: Diagnosis not present

## 2017-05-24 DIAGNOSIS — G894 Chronic pain syndrome: Secondary | ICD-10-CM | POA: Diagnosis not present

## 2017-05-24 DIAGNOSIS — M47812 Spondylosis without myelopathy or radiculopathy, cervical region: Secondary | ICD-10-CM | POA: Diagnosis not present

## 2017-05-26 DIAGNOSIS — H02834 Dermatochalasis of left upper eyelid: Secondary | ICD-10-CM | POA: Diagnosis not present

## 2017-05-29 DIAGNOSIS — D72819 Decreased white blood cell count, unspecified: Secondary | ICD-10-CM | POA: Diagnosis not present

## 2017-05-29 DIAGNOSIS — G35 Multiple sclerosis: Secondary | ICD-10-CM | POA: Diagnosis not present

## 2017-05-29 DIAGNOSIS — F339 Major depressive disorder, recurrent, unspecified: Secondary | ICD-10-CM | POA: Diagnosis not present

## 2017-06-12 DIAGNOSIS — M5136 Other intervertebral disc degeneration, lumbar region: Secondary | ICD-10-CM | POA: Diagnosis not present

## 2017-06-12 DIAGNOSIS — M47816 Spondylosis without myelopathy or radiculopathy, lumbar region: Secondary | ICD-10-CM | POA: Diagnosis not present

## 2017-06-12 DIAGNOSIS — G8929 Other chronic pain: Secondary | ICD-10-CM | POA: Diagnosis not present

## 2017-06-12 DIAGNOSIS — M545 Low back pain: Secondary | ICD-10-CM | POA: Diagnosis not present

## 2017-06-13 DIAGNOSIS — M47816 Spondylosis without myelopathy or radiculopathy, lumbar region: Secondary | ICD-10-CM | POA: Diagnosis not present

## 2017-06-13 DIAGNOSIS — M5136 Other intervertebral disc degeneration, lumbar region: Secondary | ICD-10-CM | POA: Diagnosis not present

## 2017-06-13 DIAGNOSIS — M545 Low back pain: Secondary | ICD-10-CM | POA: Diagnosis not present

## 2017-06-13 DIAGNOSIS — G8929 Other chronic pain: Secondary | ICD-10-CM | POA: Diagnosis not present

## 2017-06-29 ENCOUNTER — Telehealth: Payer: Self-pay | Admitting: Physician Assistant

## 2017-06-29 MED ORDER — OXYCODONE HCL 10 MG PO TABS: 10.0000 mg | ORAL_TABLET | Freq: Four times a day (QID) | ORAL | 0 refills | Status: DC | PRN

## 2017-06-29 NOTE — Telephone Encounter (Signed)
Pt states she has been seeing ortho and now has an appt set up with Neurosurgery 07/17/17 and is in a lot of pain and says she is going to run out of her oxycodone and that the 5mg  doesn't really help. Advised pt our office policy is that she must be seen for pain medication but no appt's available for you until 07/07/17. Please advise.

## 2017-06-29 NOTE — Telephone Encounter (Signed)
Who is doing the injection? And does she need ortho or neurosurgeon referral?

## 2017-06-29 NOTE — Telephone Encounter (Signed)
Pt aware and scheduled a f/u with Particia Nearing 07/10/17 at 9:55.

## 2017-06-29 NOTE — Telephone Encounter (Signed)
I sent a new prescription in for oxycodone 10 mg and she can have a follow-up with me in the next 2 weeks whenever there is a open appointment.  Prescription has been sent.Marland Kitchen

## 2017-07-10 ENCOUNTER — Encounter: Payer: Self-pay | Admitting: Physician Assistant

## 2017-07-10 ENCOUNTER — Ambulatory Visit: Payer: Medicare Other | Admitting: Physician Assistant

## 2017-07-10 DIAGNOSIS — M47812 Spondylosis without myelopathy or radiculopathy, cervical region: Secondary | ICD-10-CM

## 2017-07-10 DIAGNOSIS — M5134 Other intervertebral disc degeneration, thoracic region: Secondary | ICD-10-CM | POA: Diagnosis not present

## 2017-07-10 DIAGNOSIS — M5030 Other cervical disc degeneration, unspecified cervical region: Secondary | ICD-10-CM

## 2017-07-10 DIAGNOSIS — M5136 Other intervertebral disc degeneration, lumbar region: Secondary | ICD-10-CM

## 2017-07-10 DIAGNOSIS — M4692 Unspecified inflammatory spondylopathy, cervical region: Secondary | ICD-10-CM | POA: Diagnosis not present

## 2017-07-10 MED ORDER — OXYCODONE HCL 10 MG PO TABS: 10.0000 mg | ORAL_TABLET | Freq: Four times a day (QID) | ORAL | 0 refills | Status: DC

## 2017-07-10 MED ORDER — HYDROCHLOROTHIAZIDE 25 MG PO TABS: 25.0000 mg | ORAL_TABLET | Freq: Every day | ORAL | 3 refills | Status: DC

## 2017-07-10 MED ORDER — OXYCODONE HCL 10 MG PO TABS: 10.0000 mg | ORAL_TABLET | Freq: Four times a day (QID) | ORAL | 0 refills | Status: DC | PRN

## 2017-07-10 NOTE — Patient Instructions (Signed)
In a few days you may receive a survey in the mail or online from [employer] regarding your visit with us today. Please take a moment to fill this out. Your feedback is very important to our whole office. It can help us better understand your needs as well as improve your experience and satisfaction. Thank you for taking your time to complete it. We care about you.  Shahd Occhipinti, PA-C  

## 2017-07-11 NOTE — Progress Notes (Signed)
BP 128/88   Pulse 65   Temp (!) 97.5 F (36.4 C) (Oral)   Ht 5\' 8"  (1.727 m)   Wt 127 lb 6.4 oz (57.8 kg)   BMI 19.37 kg/m    Subjective:    Patient ID: Angel Maddox, female    DOB: [birth year], 62 y.o.   MRN: [id number]  HPI: Angel Maddox is a 62 y.o. female presenting on 07/10/2017 for Follow-up (back pain )  This patient comes in for periodic recheck on medications and conditions including degenerative disc disease throughout the spine and facet arthritis.  Patient states that she is fairly stable with her current medications.  She will be seeing a neurosurgeon soon.  She was referred by her neurologist.  She will sometimes take at max 4/day but generally 1 or 2 pain pills per day..   All medications are reviewed today. There are no reports of any problems with the medications. All of the medical conditions are reviewed and updated.  Lab work is reviewed and will be ordered as medically necessary. There are no new problems reported with today's visit.   Relevant past medical, surgical, family and social history reviewed and updated as indicated. Allergies and medications reviewed and updated.  Past Medical History:  Diagnosis Date  . Anxiety   . Bipolar 1 disorder ([place])   . Chronic kidney disease    kidney stone  . Depression   . Elevated liver enzymes   . H/O hiatal hernia   . Hip fracture ([place]) 2017   left  . Hypertension   . Multiple sclerosis ([place])     Had for 15 years  . Tremors of nervous system     Past Surgical History:  Procedure Laterality Date  . COLONOSCOPY  2008   Dr.Kaplan  . HIP PINNING,CANNULATED Left 11/09/2015   Procedure: CANNULATED HIP PINNING;  Surgeon: Rod Can, MD;  Location: [facility];  Service: Orthopedics;  Laterality: Left;  . LIPOMA EXCISION    . TUBAL LIGATION      Review of Systems  Constitutional: Negative for activity change, fatigue and fever.  HENT: Negative.   Eyes: Negative.   Respiratory: Negative.  Negative for  cough.   Cardiovascular: Negative.  Negative for chest pain.  Gastrointestinal: Negative.  Negative for abdominal pain.  Endocrine: Negative.   Genitourinary: Negative.  Negative for dysuria.  Musculoskeletal: Positive for arthralgias, back pain, gait problem, myalgias, neck pain and neck stiffness.  Skin: Negative.   Neurological: Positive for weakness.    Allergies as of 07/10/2017   No Known Allergies     Medication List        Accurate as of 07/10/17 11:59 PM. Always use your most recent med list.          buPROPion 300 MG 24 hr tablet Commonly known as:  WELLBUTRIN XL TAKE 1 TABLET EVERY DAY   CALCIUM 600 600 MG Tabs tablet Generic drug:  calcium carbonate Take 600 mg by mouth daily.   citalopram 40 MG tablet Commonly known as:  CELEXA TAKE 1 TABLET (40 MG TOTAL) BY MOUTH DAILY WITH BREAKFAST.   clonazePAM 0.5 MG tablet Commonly known as:  KLONOPIN Take 0.25 mg by mouth at bedtime.   gabapentin 800 MG tablet Commonly known as:  NEURONTIN Take 200 mg by mouth 3 (three) times daily.   hydrochlorothiazide 25 MG tablet Commonly known as:  HYDRODIURIL Take 1 tablet (25 mg total) by mouth daily.   hydrOXYzine 10 MG tablet Commonly  known as:  ATARAX/VISTARIL TAKE 1 TO 3 TABLETS 3 TIMES A DAY AS NEEDED ANXIETY   OCREVUS IV Inject 600 mg into the vein. 300 mg on 9/27 and 04/07/17.  Then 600 mg every 6 months.   omeprazole 20 MG capsule Commonly known as:  PRILOSEC Take 1 capsule (20 mg total) by mouth daily.   Oxycodone HCl 10 MG Tabs Take 1 tablet (10 mg total) by mouth every 6 (six) hours as needed.   Oxycodone HCl 10 MG Tabs Take 1 tablet (10 mg total) by mouth 4 (four) times daily.   Oxycodone HCl 10 MG Tabs Take 1 tablet (10 mg total) by mouth 4 (four) times daily.   primidone 250 MG tablet Commonly known as:  MYSOLINE Take 1 tablet (250 mg total) by mouth every 8 (eight) hours.   risedronate 150 MG tablet Commonly known as:  ACTONEL TAKE 1 TAB  EVERY THIRTY DAYS WITH WATER ON AN EMPTY STOMACH, NOTHING BY MOUTH OR LIE DOWN FOR 30MINS   risperiDONE 1 MG tablet Commonly known as:  RISPERDAL Take 1 mg by mouth at bedtime.   traZODone 50 MG tablet Commonly known as:  DESYREL Take 1 tablet (50 mg total) by mouth at bedtime and may repeat dose one time if needed.   valACYclovir 1000 MG tablet Commonly known as:  VALTREX Take 1 tablet (1,000 mg total) by mouth 2 (two) times daily. Then stay on one tab daily as prevention   WOMENS DAILY FORMULA PO Take by mouth.          Objective:    BP 128/88   Pulse 65   Temp (!) 97.5 F (36.4 C) (Oral)   Ht 5\' 8"  (1.727 m)   Wt 127 lb 6.4 oz (57.8 kg)   BMI 19.37 kg/m   No Known Allergies  Physical Exam  Constitutional: She appears well-developed and well-nourished.  HENT:  Head: Normocephalic and atraumatic.  Eyes: Conjunctivae are normal. Pupils are equal, round, and reactive to light.  Cardiovascular: Normal rate and regular rhythm.  Pulmonary/Chest: Effort normal and breath sounds normal.  Musculoskeletal:       Cervical back: She exhibits decreased range of motion, tenderness, pain and spasm. She exhibits no edema and no deformity.       Lumbar back: She exhibits decreased range of motion, tenderness and pain.  Nursing note and vitals reviewed.       Assessment & Plan:   1. DDD (degenerative disc disease), cervical - Oxycodone HCl 10 MG TABS; Take 1 tablet (10 mg total) by mouth every 6 (six) hours as needed.  Dispense: 120 tablet; Refill: 0 - Oxycodone HCl 10 MG TABS; Take 1 tablet (10 mg total) by mouth 4 (four) times daily.  Dispense: 120 tablet; Refill: 0 - Oxycodone HCl 10 MG TABS; Take 1 tablet (10 mg total) by mouth 4 (four) times daily.  Dispense: 120 tablet; Refill: 0 - hydrochlorothiazide (HYDRODIURIL) 25 MG tablet; Take 1 tablet (25 mg total) by mouth daily.  Dispense: 90 tablet; Refill: 3  2. DDD (degenerative disc disease), thoracic  3. DDD  (degenerative disc disease), lumbar - Oxycodone HCl 10 MG TABS; Take 1 tablet (10 mg total) by mouth every 6 (six) hours as needed.  Dispense: 120 tablet; Refill: 0 - Oxycodone HCl 10 MG TABS; Take 1 tablet (10 mg total) by mouth 4 (four) times daily.  Dispense: 120 tablet; Refill: 0 - Oxycodone HCl 10 MG TABS; Take 1 tablet (10 mg total) by mouth  4 (four) times daily.  Dispense: 120 tablet; Refill: 0 - hydrochlorothiazide (HYDRODIURIL) 25 MG tablet; Take 1 tablet (25 mg total) by mouth daily.  Dispense: 90 tablet; Refill: 3  4. Facet arthritis of cervical region (HCC) - Oxycodone HCl 10 MG TABS; Take 1 tablet (10 mg total) by mouth every 6 (six) hours as needed.  Dispense: 120 tablet; Refill: 0 - Oxycodone HCl 10 MG TABS; Take 1 tablet (10 mg total) by mouth 4 (four) times daily.  Dispense: 120 tablet; Refill: 0 - Oxycodone HCl 10 MG TABS; Take 1 tablet (10 mg total) by mouth 4 (four) times daily.  Dispense: 120 tablet; Refill: 0 - hydrochlorothiazide (HYDRODIURIL) 25 MG tablet; Take 1 tablet (25 mg total) by mouth daily.  Dispense: 90 tablet; Refill: 3    Current Outpatient Medications:  .  buPROPion (WELLBUTRIN XL) 300 MG 24 hr tablet, TAKE 1 TABLET EVERY DAY, Disp: 30 tablet, Rfl: 0 .  calcium carbonate (CALCIUM 600) 600 MG TABS tablet, Take 600 mg by mouth daily., Disp: , Rfl:  .  citalopram (CELEXA) 40 MG tablet, TAKE 1 TABLET (40 MG TOTAL) BY MOUTH DAILY WITH BREAKFAST., Disp: , Rfl: 3 .  clonazePAM (KLONOPIN) 0.5 MG tablet, Take 0.25 mg by mouth at bedtime., Disp: , Rfl:  .  gabapentin (NEURONTIN) 800 MG tablet, Take 200 mg by mouth 3 (three) times daily., Disp: , Rfl:  .  hydrOXYzine (ATARAX/VISTARIL) 10 MG tablet, TAKE 1 TO 3 TABLETS 3 TIMES A DAY AS NEEDED ANXIETY, Disp: , Rfl: 0 .  Multiple Vitamins-Minerals (WOMENS DAILY FORMULA PO), Take by mouth., Disp: , Rfl:  .  Ocrelizumab (OCREVUS IV), Inject 600 mg into the vein. 300 mg on 9/27 and 04/07/17.  Then 600 mg every 6 months.,  Disp: , Rfl:  .  omeprazole (PRILOSEC) 20 MG capsule, Take 1 capsule (20 mg total) by mouth daily., Disp: 30 capsule, Rfl: 11 .  Oxycodone HCl 10 MG TABS, Take 1 tablet (10 mg total) by mouth every 6 (six) hours as needed., Disp: 120 tablet, Rfl: 0 .  primidone (MYSOLINE) 250 MG tablet, Take 1 tablet (250 mg total) by mouth every 8 (eight) hours., Disp: 90 tablet, Rfl: 0 .  risedronate (ACTONEL) 150 MG tablet, TAKE 1 TAB EVERY THIRTY DAYS WITH WATER ON AN EMPTY STOMACH, NOTHING BY MOUTH OR LIE DOWN FOR 30MINS, Disp: , Rfl: 3 .  risperiDONE (RISPERDAL) 1 MG tablet, Take 1 mg by mouth at bedtime. , Disp: , Rfl:  .  traZODone (DESYREL) 50 MG tablet, Take 1 tablet (50 mg total) by mouth at bedtime and may repeat dose one time if needed., Disp: 30 tablet, Rfl: 0 .  valACYclovir (VALTREX) 1000 MG tablet, Take 1 tablet (1,000 mg total) by mouth 2 (two) times daily. Then stay on one tab daily as prevention, Disp: 40 tablet, Rfl: 11 .  hydrochlorothiazide (HYDRODIURIL) 25 MG tablet, Take 1 tablet (25 mg total) by mouth daily., Disp: 90 tablet, Rfl: 3 .  Oxycodone HCl 10 MG TABS, Take 1 tablet (10 mg total) by mouth 4 (four) times daily., Disp: 120 tablet, Rfl: 0 .  Oxycodone HCl 10 MG TABS, Take 1 tablet (10 mg total) by mouth 4 (four) times daily., Disp: 120 tablet, Rfl: 0 Continue all other maintenance medications as listed above.  Follow up plan: Return in about 3 months (around 10/08/2017) for recheck.  Educational handout given for [place] PA-C [place] [address]  [place], [place] [address] [phone]   07/11/2017, 8:36 AM

## 2017-07-17 ENCOUNTER — Other Ambulatory Visit: Payer: Self-pay | Admitting: Neurological Surgery

## 2017-07-21 NOTE — Pre-Procedure Instructions (Signed)
Angel Maddox  07/21/2017      [place] [address] Phone: [phone] Fax: [phone]  [id number] - [place], [place] [address] Phone: [phone] Fax: [place], [place] [address] [place] [address] Phone: [phone] Fax: [phone]    Your procedure is scheduled on Friday, Feb. 1st.   Report to [facility] Admitting at 5:30 AM             (posted surgery time 7:30 a - 10:08a)   Call this number if you have problems the morning of surgery:  [phone]   Remember:              4-5 days prior to surgery, STOP TAKING any Vitamins, Herbal Supplements, Anti-inflammatories, Blood Thinners.   Do not eat food or drink liquids after midnight, Thursday.   Take these medicines the morning of surgery with A SIP OF WATER : Wellbutrin, Celexa, Gabapentin, Prilosec, Oxycodone, Mysoline   Do not wear jewelry, make-up or nail polish.  Do not wear lotions, powders, perfumes, or deodorant.                 Do not bring valuables to the hospital.  [facility] is not responsible for any belongings or valuables.  Contacts, dentures or bridgework may not be worn into surgery.  Leave your suitcase in the car.  After surgery it may be brought to your room.  For patients admitted to the hospital, discharge time will be determined by your treatment team.  Please read over the following fact sheets that you were given. Pain Booklet, MRSA Information and Surgical Site Infection Prevention       [facility]- Preparing For Surgery  Before surgery, you can play an important role. Because skin is not sterile, your skin needs to be as free of germs as possible. You can reduce the number of germs on your skin by washing with CHG (chlorahexidine gluconate) Soap before surgery.   CHG is an antiseptic cleaner which kills germs and bonds with the skin to continue killing germs even after washing.  Please do not use if you have an allergy to CHG or antibacterial soaps. If your skin becomes reddened/irritated stop using the CHG.  Do not shave (including legs and underarms) for at least 48 hours prior to first CHG shower. It is OK to shave your face.  Please follow these instructions carefully.   1. Shower the NIGHT BEFORE SURGERY and the MORNING OF SURGERY with CHG.   2. If you chose to wash your hair, wash your hair first as usual with your normal shampoo.  3. After you shampoo, rinse your hair and body thoroughly to remove the shampoo.  4. Use CHG as you would any other liquid soap. You can apply CHG directly to the skin and wash gently with a scrungie or a clean washcloth.   5. Apply the CHG Soap to your body ONLY FROM THE NECK DOWN.  Do not use on open wounds or open sores. Avoid contact with your eyes, ears, mouth and genitals (private parts). Wash Face and genitals (private parts)  with your normal soap.  6. Wash thoroughly, paying special attention to the area where your surgery will be performed.  7. Thoroughly rinse your body with warm water from the  neck down.  8. DO NOT shower/wash with your normal soap after using and rinsing off the CHG Soap.  9. Pat yourself dry with a CLEAN TOWEL.  10. Wear CLEAN PAJAMAS to bed the night before surgery, wear comfortable clothes the morning of surgery  11. Place CLEAN SHEETS on your bed the night of your first shower and DO NOT SLEEP WITH PETS.    Day of Surgery: Do not apply any deodorants/lotions. Please wear clean clothes to the hospital/surgery center.

## 2017-07-24 ENCOUNTER — Encounter (HOSPITAL_COMMUNITY): Payer: Self-pay

## 2017-07-24 ENCOUNTER — Ambulatory Visit (HOSPITAL_COMMUNITY)
Admission: RE | Admit: 2017-07-24 | Discharge: 2017-07-24 | Disposition: A | Discharge status: Home / Self Care | Payer: Medicare Other | Source: Ambulatory Visit | Attending: Neurological Surgery | Admitting: Neurological Surgery

## 2017-07-24 ENCOUNTER — Encounter (HOSPITAL_COMMUNITY)
Admission: RE | Admit: 2017-07-24 | Discharge: 2017-07-24 | Disposition: A | Discharge status: Home / Self Care | Payer: Medicare Other | Source: Ambulatory Visit | Attending: Neurological Surgery | Admitting: Neurological Surgery

## 2017-07-24 ENCOUNTER — Other Ambulatory Visit: Payer: Self-pay

## 2017-07-24 DIAGNOSIS — R918 Other nonspecific abnormal finding of lung field: Secondary | ICD-10-CM | POA: Diagnosis not present

## 2017-07-24 DIAGNOSIS — M542 Cervicalgia: Secondary | ICD-10-CM | POA: Diagnosis not present

## 2017-07-24 DIAGNOSIS — Z01818 Encounter for other preprocedural examination: Secondary | ICD-10-CM | POA: Insufficient documentation

## 2017-07-24 DIAGNOSIS — R001 Bradycardia, unspecified: Secondary | ICD-10-CM | POA: Insufficient documentation

## 2017-07-24 HISTORY — DX: Gastro-esophageal reflux disease without esophagitis: K21.9

## 2017-07-24 HISTORY — DX: Headache, unspecified: R51.9

## 2017-07-24 HISTORY — DX: Personal history of urinary calculi: Z87.442

## 2017-07-24 HISTORY — DX: Unspecified osteoarthritis, unspecified site: M19.90

## 2017-07-24 HISTORY — DX: Headache: R51

## 2017-07-24 LAB — BASIC METABOLIC PANEL
ANION GAP: 11 (ref 5–15)
BUN: 9 mg/dL (ref 6–20)
CO2: 26 mmol/L (ref 22–32)
Calcium: 9.2 mg/dL (ref 8.9–10.3)
Chloride: 89 mmol/L — ABNORMAL LOW (ref 101–111)
Creatinine, Ser: 0.8 mg/dL (ref 0.44–1.00)
Glucose, Bld: 82 mg/dL (ref 65–99)
Potassium: 3.8 mmol/L (ref 3.5–5.1)
SODIUM: 126 mmol/L — AB (ref 135–145)

## 2017-07-24 LAB — CBC WITH DIFFERENTIAL/PLATELET
BASOS ABS: 0 10*3/uL (ref 0.0–0.1)
Basophils Relative: 1 %
EOS ABS: 0.1 10*3/uL (ref 0.0–0.7)
EOS PCT: 2 %
HCT: 38.7 % (ref 36.0–46.0)
HEMOGLOBIN: 13.6 g/dL (ref 12.0–15.0)
LYMPHS PCT: 48 %
Lymphs Abs: 2 10*3/uL (ref 0.7–4.0)
MCH: 34.9 pg — AB (ref 26.0–34.0)
MCHC: 35.1 g/dL (ref 30.0–36.0)
MCV: 99.2 fL (ref 78.0–100.0)
Monocytes Absolute: 0.5 10*3/uL (ref 0.1–1.0)
Monocytes Relative: 11 %
NEUTROS PCT: 38 %
Neutro Abs: 1.6 10*3/uL — ABNORMAL LOW (ref 1.7–7.7)
Platelets: 275 10*3/uL (ref 150–400)
RBC: 3.9 MIL/uL (ref 3.87–5.11)
RDW: 11.8 % (ref 11.5–15.5)
WBC: 4.2 10*3/uL (ref 4.0–10.5)

## 2017-07-24 LAB — SURGICAL PCR SCREEN
MRSA, PCR: NEGATIVE
STAPHYLOCOCCUS AUREUS: NEGATIVE

## 2017-07-24 LAB — PROTIME-INR
INR: 0.99
PROTHROMBIN TIME: 13 s (ref 11.4–15.2)

## 2017-07-24 NOTE — Progress Notes (Signed)
   07/24/17 1433  OBSTRUCTIVE SLEEP APNEA  Have you ever been diagnosed with sleep apnea through a sleep study? No  Do you snore loudly (loud enough to be heard through closed doors)?  1  Do you often feel tired, fatigued, or sleepy during the daytime (such as falling asleep during driving or talking to someone)? 1 (feels fatigued all the time d/t MS)  Has anyone observed you stop breathing during your sleep? 0  Do you have, or are you being treated for high blood pressure? 1  BMI more than 35 kg/m2? 0  Age > 50 (1-yes) 1  Neck circumference greater than:Female 16 inches or larger, Female 17inches or larger? 0  Female Gender (Yes=1) 0  Obstructive Sleep Apnea Score 4  Score 5 or greater  Results sent to PCP

## 2017-07-24 NOTE — Progress Notes (Addendum)
PCP Is Jonn Shingles, [place]  LOV 06/2017 MS specialist is Dr. Delphia Grates in [place], [place].  [place] 04/2017 Denies any cardiac issues, no murmur, no cp, no sob, no tests

## 2017-07-27 NOTE — Anesthesia Preprocedure Evaluation (Addendum)
Anesthesia Evaluation  Patient identified by MRN, date of birth, ID band Patient awake    Reviewed: Allergy & Precautions, NPO status , Patient's Chart, lab work & pertinent test results  History of Anesthesia Complications Negative for: history of anesthetic complications  Airway Mallampati: II       Dental no notable dental hx. (+) Dental Advisory Given   Pulmonary former smoker,    Pulmonary exam normal        Cardiovascular hypertension, Pt. on medications Normal cardiovascular exam     Neuro/Psych  Headaches, PSYCHIATRIC DISORDERS Anxiety Depression Bipolar Disorder    GI/Hepatic Neg liver ROS, hiatal hernia, GERD  ,  Endo/Other  negative endocrine ROS  Renal/GU negative Renal ROS     Musculoskeletal   Abdominal   Peds  Hematology   Anesthesia Other Findings   Reproductive/Obstetrics                            Anesthesia Physical Anesthesia Plan  ASA: II  Anesthesia Plan: General   Post-op Pain Management:    Induction: Intravenous  PONV Risk Score and Plan: 3 and Ondansetron, Dexamethasone and Scopolamine patch - Pre-op  Airway Management Planned: Oral ETT  Additional Equipment:   Intra-op Plan:   Post-operative Plan: Extubation in OR  Informed Consent: I have reviewed the patients History and Physical, chart, labs and discussed the procedure including the risks, benefits and alternatives for the proposed anesthesia with the patient or authorized representative who has indicated his/her understanding and acceptance.   Dental advisory given  Plan Discussed with: CRNA and Anesthesiologist  Anesthesia Plan Comments:        Anesthesia Quick Evaluation

## 2017-07-28 ENCOUNTER — Ambulatory Visit (HOSPITAL_COMMUNITY): Payer: Medicare Other

## 2017-07-28 ENCOUNTER — Ambulatory Visit (HOSPITAL_COMMUNITY)
Admission: RE | Disposition: A | Discharge status: Home / Self Care | Payer: Self-pay | Source: Ambulatory Visit | Attending: Neurological Surgery

## 2017-07-28 ENCOUNTER — Other Ambulatory Visit: Payer: Self-pay

## 2017-07-28 ENCOUNTER — Encounter (HOSPITAL_COMMUNITY): Payer: Self-pay | Admitting: Neurological Surgery

## 2017-07-28 ENCOUNTER — Ambulatory Visit (HOSPITAL_COMMUNITY): Payer: Medicare Other | Admitting: Anesthesiology

## 2017-07-28 ENCOUNTER — Observation Stay (HOSPITAL_COMMUNITY)
Admission: RE | Admit: 2017-07-28 | Discharge: 2017-07-29 | Disposition: A | Discharge status: Home / Self Care | Payer: Medicare Other | Source: Ambulatory Visit | Attending: Neurological Surgery | Admitting: Neurological Surgery

## 2017-07-28 DIAGNOSIS — M47812 Spondylosis without myelopathy or radiculopathy, cervical region: Secondary | ICD-10-CM | POA: Diagnosis present

## 2017-07-28 DIAGNOSIS — Z87442 Personal history of urinary calculi: Secondary | ICD-10-CM | POA: Diagnosis not present

## 2017-07-28 DIAGNOSIS — Z8249 Family history of ischemic heart disease and other diseases of the circulatory system: Secondary | ICD-10-CM | POA: Diagnosis not present

## 2017-07-28 DIAGNOSIS — M50121 Cervical disc disorder at C4-C5 level with radiculopathy: Secondary | ICD-10-CM | POA: Insufficient documentation

## 2017-07-28 DIAGNOSIS — Z87891 Personal history of nicotine dependence: Secondary | ICD-10-CM | POA: Insufficient documentation

## 2017-07-28 DIAGNOSIS — F419 Anxiety disorder, unspecified: Secondary | ICD-10-CM | POA: Diagnosis not present

## 2017-07-28 DIAGNOSIS — M5030 Other cervical disc degeneration, unspecified cervical region: Secondary | ICD-10-CM

## 2017-07-28 DIAGNOSIS — M4722 Other spondylosis with radiculopathy, cervical region: Principal | ICD-10-CM | POA: Insufficient documentation

## 2017-07-28 DIAGNOSIS — Z79899 Other long term (current) drug therapy: Secondary | ICD-10-CM | POA: Diagnosis not present

## 2017-07-28 DIAGNOSIS — F319 Bipolar disorder, unspecified: Secondary | ICD-10-CM | POA: Diagnosis not present

## 2017-07-28 DIAGNOSIS — G35 Multiple sclerosis: Secondary | ICD-10-CM | POA: Diagnosis not present

## 2017-07-28 DIAGNOSIS — I129 Hypertensive chronic kidney disease with stage 1 through stage 4 chronic kidney disease, or unspecified chronic kidney disease: Secondary | ICD-10-CM | POA: Insufficient documentation

## 2017-07-28 DIAGNOSIS — K219 Gastro-esophageal reflux disease without esophagitis: Secondary | ICD-10-CM | POA: Diagnosis not present

## 2017-07-28 DIAGNOSIS — M4322 Fusion of spine, cervical region: Secondary | ICD-10-CM | POA: Diagnosis present

## 2017-07-28 DIAGNOSIS — Z940 Kidney transplant status: Secondary | ICD-10-CM | POA: Insufficient documentation

## 2017-07-28 DIAGNOSIS — Z809 Family history of malignant neoplasm, unspecified: Secondary | ICD-10-CM | POA: Diagnosis not present

## 2017-07-28 DIAGNOSIS — M40202 Unspecified kyphosis, cervical region: Secondary | ICD-10-CM | POA: Insufficient documentation

## 2017-07-28 DIAGNOSIS — M50222 Other cervical disc displacement at C5-C6 level: Secondary | ICD-10-CM | POA: Insufficient documentation

## 2017-07-28 DIAGNOSIS — Z419 Encounter for procedure for purposes other than remedying health state, unspecified: Secondary | ICD-10-CM

## 2017-07-28 DIAGNOSIS — K449 Diaphragmatic hernia without obstruction or gangrene: Secondary | ICD-10-CM | POA: Insufficient documentation

## 2017-07-28 DIAGNOSIS — M5136 Other intervertebral disc degeneration, lumbar region: Secondary | ICD-10-CM

## 2017-07-28 DIAGNOSIS — G8929 Other chronic pain: Secondary | ICD-10-CM | POA: Diagnosis not present

## 2017-07-28 DIAGNOSIS — M1990 Unspecified osteoarthritis, unspecified site: Secondary | ICD-10-CM | POA: Diagnosis not present

## 2017-07-28 DIAGNOSIS — M50021 Cervical disc disorder at C4-C5 level with myelopathy: Secondary | ICD-10-CM | POA: Insufficient documentation

## 2017-07-28 DIAGNOSIS — N189 Chronic kidney disease, unspecified: Secondary | ICD-10-CM | POA: Insufficient documentation

## 2017-07-28 DIAGNOSIS — M4802 Spinal stenosis, cervical region: Secondary | ICD-10-CM | POA: Insufficient documentation

## 2017-07-28 DIAGNOSIS — R251 Tremor, unspecified: Secondary | ICD-10-CM | POA: Insufficient documentation

## 2017-07-28 DIAGNOSIS — R51 Headache: Secondary | ICD-10-CM | POA: Diagnosis not present

## 2017-07-28 HISTORY — PX: ANTERIOR CERVICAL DECOMP/DISCECTOMY FUSION: SHX1161

## 2017-07-28 HISTORY — DX: Cervicalgia: M54.2

## 2017-07-28 SURGERY — ANTERIOR CERVICAL DECOMPRESSION/DISCECTOMY FUSION 2 LEVELS
Anesthesia: General | Site: Spine Cervical

## 2017-07-28 MED ORDER — HYDROCHLOROTHIAZIDE 25 MG PO TABS
25.0000 mg | ORAL_TABLET | Freq: Every day | ORAL | Status: DC
  Administered 2017-07-29: 25 mg via ORAL
  Filled 2017-07-28: qty 1

## 2017-07-28 MED ORDER — GABAPENTIN 800 MG PO TABS
400.0000 mg | ORAL_TABLET | Freq: Three times a day (TID) | ORAL | Status: DC
  Filled 2017-07-28: qty 0.5

## 2017-07-28 MED ORDER — ONDANSETRON HCL 4 MG/2ML IJ SOLN: 4.0000 mg | Freq: Four times a day (QID) | INTRAMUSCULAR | Status: DC | PRN

## 2017-07-28 MED ORDER — VALACYCLOVIR HCL 500 MG PO TABS
1000.0000 mg | ORAL_TABLET | Freq: Every day | ORAL | Status: DC
  Administered 2017-07-28 – 2017-07-29 (×2): 1000 mg via ORAL
  Filled 2017-07-28 (×2): qty 2

## 2017-07-28 MED ORDER — OXYCODONE HCL 5 MG PO TABS
10.0000 mg | ORAL_TABLET | ORAL | Status: DC | PRN
  Administered 2017-07-28 – 2017-07-29 (×6): 10 mg via ORAL
  Filled 2017-07-28 (×5): qty 2

## 2017-07-28 MED ORDER — LACTATED RINGERS IV SOLN
INTRAVENOUS | Status: DC | PRN
  Administered 2017-07-28: 07:00:00 via INTRAVENOUS

## 2017-07-28 MED ORDER — 0.9 % SODIUM CHLORIDE (POUR BTL) OPTIME
TOPICAL | Status: DC | PRN
  Administered 2017-07-28: 1000 mL

## 2017-07-28 MED ORDER — PROPOFOL 10 MG/ML IV BOLUS
INTRAVENOUS | Status: DC | PRN
  Administered 2017-07-28: 140 mg via INTRAVENOUS

## 2017-07-28 MED ORDER — LIDOCAINE 2% (20 MG/ML) 5 ML SYRINGE
INTRAMUSCULAR | Status: AC
  Filled 2017-07-28: qty 10

## 2017-07-28 MED ORDER — PHENYLEPHRINE HCL 10 MG/ML IJ SOLN
INTRAVENOUS | Status: DC | PRN
  Administered 2017-07-28: 30 ug/min via INTRAVENOUS

## 2017-07-28 MED ORDER — METHOCARBAMOL 500 MG PO TABS
ORAL_TABLET | ORAL | Status: AC
  Filled 2017-07-28: qty 1

## 2017-07-28 MED ORDER — PROPOFOL 10 MG/ML IV BOLUS
INTRAVENOUS | Status: AC
  Filled 2017-07-28: qty 20

## 2017-07-28 MED ORDER — CEFAZOLIN SODIUM-DEXTROSE 2-4 GM/100ML-% IV SOLN
2.0000 g | Freq: Three times a day (TID) | INTRAVENOUS | Status: AC
  Administered 2017-07-28 – 2017-07-29 (×2): 2 g via INTRAVENOUS
  Filled 2017-07-28 (×2): qty 100

## 2017-07-28 MED ORDER — THROMBIN 5000 UNITS EX SOLR
CUTANEOUS | Status: AC
  Filled 2017-07-28: qty 5000

## 2017-07-28 MED ORDER — HEMOSTATIC AGENTS (NO CHARGE) OPTIME
TOPICAL | Status: DC | PRN
  Administered 2017-07-28: 1 via TOPICAL

## 2017-07-28 MED ORDER — SENNA 8.6 MG PO TABS
1.0000 | ORAL_TABLET | Freq: Two times a day (BID) | ORAL | Status: DC
  Administered 2017-07-28 – 2017-07-29 (×2): 8.6 mg via ORAL
  Filled 2017-07-28: qty 1

## 2017-07-28 MED ORDER — HYDROMORPHONE HCL 1 MG/ML IJ SOLN: 0.5000 mg | INTRAMUSCULAR | Status: DC | PRN

## 2017-07-28 MED ORDER — METHOCARBAMOL 500 MG PO TABS
500.0000 mg | ORAL_TABLET | Freq: Four times a day (QID) | ORAL | Status: DC | PRN
Start: 2017-07-28 — End: 2017-07-29
  Administered 2017-07-28 – 2017-07-29 (×4): 500 mg via ORAL
  Filled 2017-07-28 (×3): qty 1

## 2017-07-28 MED ORDER — PROMETHAZINE HCL 25 MG/ML IJ SOLN: 6.2500 mg | INTRAMUSCULAR | Status: DC | PRN

## 2017-07-28 MED ORDER — THROMBIN (RECOMBINANT) 5000 UNITS EX SOLR
OROMUCOSAL | Status: DC | PRN
  Administered 2017-07-28: 09:00:00 via TOPICAL

## 2017-07-28 MED ORDER — SUGAMMADEX SODIUM 200 MG/2ML IV SOLN
INTRAVENOUS | Status: DC | PRN
  Administered 2017-07-28: 100 mg via INTRAVENOUS

## 2017-07-28 MED ORDER — CITALOPRAM HYDROBROMIDE 40 MG PO TABS
40.0000 mg | ORAL_TABLET | Freq: Every day | ORAL | Status: DC
  Administered 2017-07-29: 40 mg via ORAL
  Filled 2017-07-28: qty 1

## 2017-07-28 MED ORDER — ACETAMINOPHEN 325 MG PO TABS: 650.0000 mg | ORAL_TABLET | ORAL | Status: DC | PRN

## 2017-07-28 MED ORDER — POTASSIUM CHLORIDE IN NACL 20-0.9 MEQ/L-% IV SOLN: INTRAVENOUS | Status: DC

## 2017-07-28 MED ORDER — SODIUM CHLORIDE 0.9 % IR SOLN
Status: DC | PRN
  Administered 2017-07-28: 09:00:00

## 2017-07-28 MED ORDER — FENTANYL CITRATE (PF) 250 MCG/5ML IJ SOLN
INTRAMUSCULAR | Status: AC
  Filled 2017-07-28: qty 5

## 2017-07-28 MED ORDER — HYDROMORPHONE HCL 1 MG/ML IJ SOLN
0.2500 mg | INTRAMUSCULAR | Status: DC | PRN
  Administered 2017-07-28 (×2): 0.5 mg via INTRAVENOUS

## 2017-07-28 MED ORDER — SCOPOLAMINE 1 MG/3DAYS TD PT72
1.0000 | MEDICATED_PATCH | TRANSDERMAL | Status: DC
  Administered 2017-07-28: 1.5 mg via TRANSDERMAL
  Filled 2017-07-28: qty 1

## 2017-07-28 MED ORDER — RISPERIDONE 1 MG PO TABS
1.0000 mg | ORAL_TABLET | Freq: Every day | ORAL | Status: DC
  Administered 2017-07-28: 1 mg via ORAL
  Filled 2017-07-28: qty 1

## 2017-07-28 MED ORDER — ACETAMINOPHEN 650 MG RE SUPP: 650.0000 mg | RECTAL | Status: DC | PRN

## 2017-07-28 MED ORDER — METHOCARBAMOL 1000 MG/10ML IJ SOLN
500.0000 mg | Freq: Four times a day (QID) | INTRAVENOUS | Status: DC | PRN
  Filled 2017-07-28: qty 5

## 2017-07-28 MED ORDER — SODIUM CHLORIDE 0.9% FLUSH: 3.0000 mL | INTRAVENOUS | Status: DC | PRN

## 2017-07-28 MED ORDER — CHLORHEXIDINE GLUCONATE CLOTH 2 % EX PADS: 6.0000 | MEDICATED_PAD | Freq: Once | CUTANEOUS | Status: DC

## 2017-07-28 MED ORDER — ONDANSETRON HCL 4 MG PO TABS: 4.0000 mg | ORAL_TABLET | Freq: Four times a day (QID) | ORAL | Status: DC | PRN

## 2017-07-28 MED ORDER — DEXAMETHASONE SODIUM PHOSPHATE 10 MG/ML IJ SOLN
INTRAMUSCULAR | Status: AC
  Filled 2017-07-28: qty 1

## 2017-07-28 MED ORDER — GABAPENTIN 400 MG PO CAPS
400.0000 mg | ORAL_CAPSULE | Freq: Three times a day (TID) | ORAL | Status: DC
  Administered 2017-07-28 – 2017-07-29 (×3): 400 mg via ORAL
  Filled 2017-07-28 (×3): qty 1

## 2017-07-28 MED ORDER — OXYCODONE HCL 5 MG PO TABS
ORAL_TABLET | ORAL | Status: AC
  Filled 2017-07-28: qty 2

## 2017-07-28 MED ORDER — MIDAZOLAM HCL 2 MG/2ML IJ SOLN
INTRAMUSCULAR | Status: AC
  Filled 2017-07-28: qty 2

## 2017-07-28 MED ORDER — EPHEDRINE SULFATE 50 MG/ML IJ SOLN
INTRAMUSCULAR | Status: DC | PRN
  Administered 2017-07-28: 10 mg via INTRAVENOUS
  Administered 2017-07-28: 5 mg via INTRAVENOUS
  Administered 2017-07-28: 10 mg via INTRAVENOUS

## 2017-07-28 MED ORDER — ROCURONIUM BROMIDE 10 MG/ML (PF) SYRINGE
PREFILLED_SYRINGE | INTRAVENOUS | Status: AC
  Filled 2017-07-28: qty 10

## 2017-07-28 MED ORDER — PHENOL 1.4 % MT LIQD
1.0000 | OROMUCOSAL | Status: DC | PRN
Start: 2017-07-28 — End: 2017-07-29
  Administered 2017-07-28: 1 via OROMUCOSAL
  Filled 2017-07-28: qty 177

## 2017-07-28 MED ORDER — CLONAZEPAM 0.25 MG PO TBDP
0.2500 mg | ORAL_TABLET | Freq: Every day | ORAL | Status: DC
  Administered 2017-07-28: 0.25 mg via ORAL
  Filled 2017-07-28 (×2): qty 1

## 2017-07-28 MED ORDER — BUPROPION HCL ER (XL) 300 MG PO TB24
300.0000 mg | ORAL_TABLET | Freq: Every day | ORAL | Status: DC
  Administered 2017-07-29: 300 mg via ORAL
  Filled 2017-07-28: qty 1

## 2017-07-28 MED ORDER — BUPIVACAINE HCL (PF) 0.25 % IJ SOLN
INTRAMUSCULAR | Status: AC
  Filled 2017-07-28: qty 30

## 2017-07-28 MED ORDER — CLONAZEPAM 0.5 MG PO TABS: 0.2500 mg | ORAL_TABLET | Freq: Every day | ORAL | Status: DC

## 2017-07-28 MED ORDER — TRAZODONE HCL 50 MG PO TABS
50.0000 mg | ORAL_TABLET | Freq: Every day | ORAL | Status: DC
  Administered 2017-07-28: 50 mg via ORAL
  Filled 2017-07-28: qty 1

## 2017-07-28 MED ORDER — MIDAZOLAM HCL 5 MG/5ML IJ SOLN
INTRAMUSCULAR | Status: DC | PRN
  Administered 2017-07-28: 2 mg via INTRAVENOUS

## 2017-07-28 MED ORDER — MENTHOL 3 MG MT LOZG
1.0000 | LOZENGE | OROMUCOSAL | Status: DC | PRN
  Filled 2017-07-28: qty 9

## 2017-07-28 MED ORDER — CEFAZOLIN SODIUM-DEXTROSE 2-4 GM/100ML-% IV SOLN
2.0000 g | INTRAVENOUS | Status: AC
  Administered 2017-07-28: 2 g via INTRAVENOUS
  Filled 2017-07-28: qty 100

## 2017-07-28 MED ORDER — ONDANSETRON HCL 4 MG/2ML IJ SOLN
INTRAMUSCULAR | Status: DC | PRN
  Administered 2017-07-28: 4 mg via INTRAVENOUS

## 2017-07-28 MED ORDER — HYDROXYZINE HCL 10 MG PO TABS
10.0000 mg | ORAL_TABLET | Freq: Four times a day (QID) | ORAL | Status: DC | PRN
  Filled 2017-07-28: qty 1

## 2017-07-28 MED ORDER — SUGAMMADEX SODIUM 200 MG/2ML IV SOLN
INTRAVENOUS | Status: AC
  Filled 2017-07-28: qty 2

## 2017-07-28 MED ORDER — SODIUM CHLORIDE 0.9% FLUSH
3.0000 mL | Freq: Two times a day (BID) | INTRAVENOUS | Status: DC
  Administered 2017-07-28 (×2): 3 mL via INTRAVENOUS

## 2017-07-28 MED ORDER — FENTANYL CITRATE (PF) 100 MCG/2ML IJ SOLN
INTRAMUSCULAR | Status: DC | PRN
  Administered 2017-07-28: 50 ug via INTRAVENOUS
  Administered 2017-07-28: 100 ug via INTRAVENOUS
  Administered 2017-07-28: 50 ug via INTRAVENOUS

## 2017-07-28 MED ORDER — BUPIVACAINE HCL (PF) 0.25 % IJ SOLN
INTRAMUSCULAR | Status: DC | PRN
  Administered 2017-07-28: 3 mL

## 2017-07-28 MED ORDER — ROCURONIUM BROMIDE 100 MG/10ML IV SOLN
INTRAVENOUS | Status: DC | PRN
  Administered 2017-07-28: 30 mg via INTRAVENOUS
  Administered 2017-07-28: 50 mg via INTRAVENOUS

## 2017-07-28 MED ORDER — EPHEDRINE 5 MG/ML INJ
INTRAVENOUS | Status: AC
  Filled 2017-07-28: qty 10

## 2017-07-28 MED ORDER — ONDANSETRON HCL 4 MG/2ML IJ SOLN
INTRAMUSCULAR | Status: AC
  Filled 2017-07-28: qty 2

## 2017-07-28 MED ORDER — PRIMIDONE 250 MG PO TABS
250.0000 mg | ORAL_TABLET | Freq: Three times a day (TID) | ORAL | Status: DC
Start: 2017-07-28 — End: 2017-07-29
  Administered 2017-07-28 – 2017-07-29 (×3): 250 mg via ORAL
  Filled 2017-07-28 (×4): qty 1

## 2017-07-28 MED ORDER — HYDROMORPHONE HCL 1 MG/ML IJ SOLN
INTRAMUSCULAR | Status: AC
  Filled 2017-07-28: qty 1

## 2017-07-28 MED ORDER — LIDOCAINE HCL (CARDIAC) 20 MG/ML IV SOLN
INTRAVENOUS | Status: DC | PRN
  Administered 2017-07-28: 100 mg via INTRAVENOUS

## 2017-07-28 MED ORDER — DEXAMETHASONE SODIUM PHOSPHATE 10 MG/ML IJ SOLN
10.0000 mg | INTRAMUSCULAR | Status: AC
  Administered 2017-07-28: 10 mg via INTRAVENOUS
  Filled 2017-07-28: qty 1

## 2017-07-28 SURGICAL SUPPLY — 65 items
ADH SKN CLS APL DERMABOND .7 (GAUZE/BANDAGES/DRESSINGS) ×1
APL SKNCLS STERI-STRIP NONHPOA (GAUZE/BANDAGES/DRESSINGS) ×1
BAG DECANTER FOR FLEXI CONT (MISCELLANEOUS) ×2 IMPLANT
BENZOIN TINCTURE PRP APPL 2/3 (GAUZE/BANDAGES/DRESSINGS) ×2 IMPLANT
BIT DRILL 2.3 12 FIXED (INSTRUMENTS) IMPLANT
BUR MATCHSTICK NEURO 3.0 LAGG (BURR) ×2 IMPLANT
CAGE CERVICAL 6 (Cage) ×2 IMPLANT
CAGE CERVICAL 7 (Cage) ×2 IMPLANT
CANISTER SUCT 3000ML PPV (MISCELLANEOUS) ×2 IMPLANT
CARTRIDGE OIL MAESTRO DRILL (MISCELLANEOUS) ×1 IMPLANT
DERMABOND ADVANCED (GAUZE/BANDAGES/DRESSINGS) ×1
DERMABOND ADVANCED .7 DNX12 (GAUZE/BANDAGES/DRESSINGS) IMPLANT
DIFFUSER DRILL AIR PNEUMATIC (MISCELLANEOUS) ×2 IMPLANT
DRAPE C-ARM 42X72 X-RAY (DRAPES) ×4 IMPLANT
DRAPE LAPAROTOMY 100X72 PEDS (DRAPES) ×2 IMPLANT
DRAPE MICROSCOPE LEICA (MISCELLANEOUS) IMPLANT
DRAPE POUCH INSTRU U-SHP 10X18 (DRAPES) ×2 IMPLANT
DRILL 12MM (INSTRUMENTS) ×2
DRSG OPSITE POSTOP 3X4 (GAUZE/BANDAGES/DRESSINGS) ×1 IMPLANT
DURAPREP 6ML APPLICATOR 50/CS (WOUND CARE) ×2 IMPLANT
ELECT COATED BLADE 2.86 ST (ELECTRODE) ×2 IMPLANT
ELECT REM PT RETURN 9FT ADLT (ELECTROSURGICAL) ×2
ELECTRODE REM PT RTRN 9FT ADLT (ELECTROSURGICAL) ×1 IMPLANT
GAUZE SPONGE 4X4 16PLY XRAY LF (GAUZE/BANDAGES/DRESSINGS) IMPLANT
GLOVE BIO SURGEON STRL SZ7 (GLOVE) IMPLANT
GLOVE BIO SURGEON STRL SZ8 (GLOVE) ×2 IMPLANT
GLOVE BIOGEL PI IND STRL 7.0 (GLOVE) IMPLANT
GLOVE BIOGEL PI IND STRL 7.5 (GLOVE) IMPLANT
GLOVE BIOGEL PI IND STRL 8 (GLOVE) ×1 IMPLANT
GLOVE BIOGEL PI INDICATOR 7.0 (GLOVE) ×2
GLOVE BIOGEL PI INDICATOR 7.5 (GLOVE) ×2
GLOVE BIOGEL PI INDICATOR 8 (GLOVE) ×1
GLOVE ECLIPSE 9.0 STRL (GLOVE) ×1 IMPLANT
GOWN STRL REUS W/ TWL LRG LVL3 (GOWN DISPOSABLE) IMPLANT
GOWN STRL REUS W/ TWL XL LVL3 (GOWN DISPOSABLE) ×1 IMPLANT
GOWN STRL REUS W/TWL 2XL LVL3 (GOWN DISPOSABLE) IMPLANT
GOWN STRL REUS W/TWL LRG LVL3 (GOWN DISPOSABLE) ×4
GOWN STRL REUS W/TWL XL LVL3 (GOWN DISPOSABLE) ×4
HEMOSTAT POWDER KIT SURGIFOAM (HEMOSTASIS) ×2 IMPLANT
KIT BASIN OR (CUSTOM PROCEDURE TRAY) ×2 IMPLANT
KIT ROOM TURNOVER OR (KITS) ×2 IMPLANT
NDL HYPO 25X1 1.5 SAFETY (NEEDLE) ×1 IMPLANT
NDL SPNL 20GX3.5 QUINCKE YW (NEEDLE) ×1 IMPLANT
NEEDLE HYPO 25X1 1.5 SAFETY (NEEDLE) ×2 IMPLANT
NEEDLE SPNL 20GX3.5 QUINCKE YW (NEEDLE) ×2 IMPLANT
NS IRRIG 1000ML POUR BTL (IV SOLUTION) ×2 IMPLANT
OIL CARTRIDGE MAESTRO DRILL (MISCELLANEOUS) ×2
PACK LAMINECTOMY NEURO (CUSTOM PROCEDURE TRAY) ×2 IMPLANT
PAD ARMBOARD 7.5X6 YLW CONV (MISCELLANEOUS) ×6 IMPLANT
PIN DISTRACTION 14MM (PIN) ×4 IMPLANT
PLATE 30MM (Plate) ×1 IMPLANT
PUTTY BONE 2.5CC ×2 IMPLANT
RUBBERBAND STERILE (MISCELLANEOUS) ×2 IMPLANT
SCREW 12MM (Screw) ×6 IMPLANT
SPACER SPNL 7D LRG 16X14X6XNS (Cage) ×1 IMPLANT
SPACER SPNL 7D LRG 16X14X7XNS (Cage) IMPLANT
SPCR SPNL 7D LRG 16X14X6XNS (Cage) ×1 IMPLANT
SPCR SPNL 7D LRG 16X14X7XNS (Cage) ×1 IMPLANT
SPONGE INTESTINAL PEANUT (DISPOSABLE) ×2 IMPLANT
SPONGE SURGIFOAM ABS GEL SZ50 (HEMOSTASIS) ×2 IMPLANT
STRIP CLOSURE SKIN 1/2X4 (GAUZE/BANDAGES/DRESSINGS) ×2 IMPLANT
SUT VIC AB 3-0 SH 8-18 (SUTURE) ×4 IMPLANT
TOWEL GREEN STERILE (TOWEL DISPOSABLE) ×2 IMPLANT
TOWEL GREEN STERILE FF (TOWEL DISPOSABLE) ×2 IMPLANT
WATER STERILE IRR 1000ML POUR (IV SOLUTION) ×2 IMPLANT

## 2017-07-28 NOTE — Anesthesia Procedure Notes (Signed)
Procedure Name: Intubation Date/Time: 07/28/2017 7:45 AM Performed by: White, Amedeo Plenty, CRNA Pre-anesthesia Checklist: Patient identified, Emergency Drugs available, Suction available and Patient being monitored Patient Re-evaluated:Patient Re-evaluated prior to induction Oxygen Delivery Method: Circle System Utilized Preoxygenation: Pre-oxygenation with 100% oxygen Induction Type: IV induction Ventilation: Mask ventilation without difficulty Laryngoscope Size: Mac and 4 Grade View: Grade II Tube type: Oral Tube size: 7.0 mm Number of attempts: 1 Airway Equipment and Method: Stylet Placement Confirmation: ETT inserted through vocal cords under direct vision,  positive ETCO2 and breath sounds checked- equal and bilateral Secured at: 21 cm Tube secured with: Tape Dental Injury: Teeth and Oropharynx as per pre-operative assessment

## 2017-07-28 NOTE — Progress Notes (Signed)
Orthopedic Tech Progress Note Patient Details:  Angel Maddox 1956-03-20 [id number]  Ortho Devices Type of Ortho Device: Soft collar Ortho Device/Splint Location: Soft Collar provided to [facility] nurse for application.        Kristopher Oppenheim 07/28/2017, 11:29 AM

## 2017-07-28 NOTE — Anesthesia Postprocedure Evaluation (Signed)
Anesthesia Post Note  Patient: Angel Maddox  Procedure(s) Performed: CERVICAL FOUR-FIVE, CERVICAL FIVE-SIX ANTERIOR CERVICAL DECOMPRESSION/DISCECTOMY FUSION (N/A Spine Cervical)     Patient location during evaluation: PACU Anesthesia Type: General Level of consciousness: sedated Pain management: pain level controlled Vital Signs Assessment: post-procedure vital signs reviewed and stable Respiratory status: spontaneous breathing and respiratory function stable Cardiovascular status: stable Postop Assessment: no apparent nausea or vomiting Anesthetic complications: no    Last Vitals:  Vitals:   07/28/17 1030 07/28/17 1057  BP:    Pulse: 66 (!) 59  Resp: 12 18  Temp:  (!) 36.3 C  SpO2: 100% 100%    Last Pain:  Vitals:   07/28/17 1030  TempSrc:   PainSc: [place]

## 2017-07-28 NOTE — Progress Notes (Signed)
Dr. Tobias Alexander made aware of the Na+ 126. Per, Dr. Tobias Alexander, no intervention required.

## 2017-07-28 NOTE — Transfer of Care (Signed)
Immediate Anesthesia Transfer of Care Note  Patient: Angel Maddox  Procedure(s) Performed: CERVICAL FOUR-FIVE, CERVICAL FIVE-SIX ANTERIOR CERVICAL DECOMPRESSION/DISCECTOMY FUSION (N/A Spine Cervical)  Patient Location: PACU  Anesthesia Type:General  Level of Consciousness: awake, alert  and patient cooperative  Airway & Oxygen Therapy: Patient Spontanous Breathing  Post-op Assessment: Report given to RN and Post -op Vital signs reviewed and stable  Post vital signs: Reviewed and stable  Last Vitals:  Vitals:   07/28/17 0600 07/28/17 0936  BP: 118/79   Pulse: (!) 55   Resp: 18   Temp: 36.7 C (!) (P) 36.3 C  SpO2: 100%     Last Pain:  Vitals:   07/28/17 0600  TempSrc: Oral      Patients Stated Pain Goal: 3 (14/70/92 9574)  Complications: No apparent anesthesia complications

## 2017-07-28 NOTE — Op Note (Signed)
07/28/2017  9:35 AM  PATIENT:  Angel Maddox  62 y.o. female  PRE-OPERATIVE DIAGNOSIS:  Cervical spondylosis with kyphosis and instability at C4-5 and degenerative disc disease C5-6 with chronic neck pain  POST-OPERATIVE DIAGNOSIS:  same  PROCEDURE:  1. anterior cervical discectomy C4-5 C5-6 , 2. Anterior cervical arthrodesis C4-5 C5-6 utilizing a peek interbody cage packed with locally harvested morcellized autologous bone graft mixed with morselized allograft, 3. Anterior cervical plating C4-C6 inclusive utilizing a Alphatec plate  SURGEON:  Sherley Bounds, MD  ASSISTANTS: Dr. pool  ANESTHESIA:   General  EBL: 30 ml  Total I/O In: -  Out: 30 [Blood:30]  BLOOD ADMINISTERED: none  DRAINS: none  SPECIMEN:  none  INDICATION FOR PROCEDURE: This patient presented with severe chronic long-standing neck pain. Imaging showed cervical spondylosis at C4-5 and C5-6 with focal kyphosis and instability at C4-5 and degenerative disc disease C5-6. The patient tried conservative measures without relief. Pain was debilitating. Recommended ACDF with plating. She did not have neural compression and therefore I did not think that decompression needed to be undertaken and therefore we simply did a discectomy and fusion to stabilize her spondylosis and instability. Patient understood the risks, benefits, and alternatives and potential outcomes and wished to proceed.  PROCEDURE DETAILS: Patient was brought to the operating room placed under general endotracheal anesthesia. Patient was placed in the supine position on the operating room table. The neck was prepped with Duraprep and draped in a sterile fashion.   Three cc of local anesthesia was injected and a transverse incision was made on the right side of the neck.  Dissection was carried down thru the subcutaneous tissue and the platysma was  elevated, opened, and undermined with Metzenbaum scissors.  Dissection was then carried out thru an avascular  plane leaving the sternocleidomastoid carotid artery and jugular vein laterally and the trachea and esophagus medially. The ventral aspect of the vertebral column was identified and a localizing x-ray was taken. The C5-6 level was identified. The longus colli muscles were then elevated and the retractor was placed to expose C4-5 and C5-6. The annulus was incised at both levels and the disc space entered. Discectomy was performed with micro-curettes and pituitary rongeurs. I then used the high-speed drill to drill the endplates down to the level othe posterior longitudinal ligament. The drill shavings were saved in a mucous trap for later arthrodesis. . Discectomy was continued posteriorly thru the disc space. The endplates were prepared for arthrodesis. We then measured the height of the intravertebral disc space at each level and selected a 7 millimeter Peek interbody cage packed with autograft and morcellized allograft. It was then gently positioned in the intravertebral disc spaces and countersunk at both levels. I then used a Alphatec plate and placed variable angle screws into the vertebral bodies of each level and locked them into position. The wound was irrigated with bacitracin solution, checked for hemostasis which was established and confirmed. Once meticulous hemostasis was achieved, we then proceeded with closure. The platysma was closed with interrupted 3-0 undyed Vicryl suture, the subcuticular layer was closed with interrupted 3-0 undyed Vicryl suture. The skin edges were approximated with steristrips. The drapes were removed. A sterile dressing was applied. The patient was then awakened from general anesthesia and transferred to the recovery room in stable condition. At the end of the procedure all sponge, needle and instrument counts were correct.   PLAN OF CARE: Admit for overnight observation  PATIENT DISPOSITION:  PACU - hemodynamically  stable.   Delay start of Pharmacological VTE agent  (>24hrs) due to surgical blood loss or risk of bleeding:  yes

## 2017-07-28 NOTE — H&P (Signed)
Subjective:   Patient is a 62 y.o. female admitted for ACDF. The patient first presented to me with complaints of neck pain and numbness of the arm(s). Onset of symptoms was several years ago. The pain is described as aching and occurs all day. The pain is rated severe, unremitting, and is located in the neck. The symptoms have been progressive. Symptoms are exacerbated by extending head backwards, and are relieved by none.  Previous work up includes MRI of cervical spine, results: disc bulge at C4-C5 and C5-C6 bilateral.  Past Medical History:  Diagnosis Date  . Anxiety   . Arthritis    osteo  . Bipolar 1 disorder ([place])   . Chronic kidney disease    kidney stone  . Depression   . Elevated liver enzymes    She says "normal now"  . GERD (gastroesophageal reflux disease)   . H/O hiatal hernia   . Headache   . Hip fracture ([place]) 2017   left  . History of kidney stones    4-5 yrs ago  . Hypertension   . Multiple sclerosis ([place])     Had for 15 years  . Multiple sclerosis ([place])    dx 1999  . Tremors of nervous system     Past Surgical History:  Procedure Laterality Date  . COLONOSCOPY  2008   Dr.Kaplan  . EYE SURGERY     [facility]  . HIP PINNING,CANNULATED Left 11/09/2015   Procedure: CANNULATED HIP PINNING;  Surgeon: Rod Can, MD;  Location: [facility];  Service: Orthopedics;  Laterality: Left;  . LIPOMA EXCISION    . TUBAL LIGATION      No Known Allergies  Social History   Tobacco Use  . Smoking status: Former Smoker    Types: Cigarettes    Last attempt to quit: 01/25/1991    Years since quitting: 26.5  . Smokeless tobacco: Never Used  Substance Use Topics  . Alcohol use: No    Family History  Problem Relation Age of Onset  . Heart attack Mother   . Cancer Maternal Aunt   . Colon cancer Neg Hx   . Stomach cancer Neg Hx    Prior to Admission medications   Medication Sig Start Date End Date Taking? Authorizing Provider  Ascorbic Acid (VITAMIN C)  1000 MG tablet Take 1,000 mg by mouth daily.   Yes [provider]  buPROPion (WELLBUTRIN XL) 300 MG 24 hr tablet TAKE 1 TABLET EVERY DAY 06/08/16  Yes Timmothy Euler, MD  calcium carbonate (OSCAL) 1500 (600 Ca) MG TABS tablet Take 1,500 mg by mouth daily with breakfast.   Yes [provider]  citalopram (CELEXA) 40 MG tablet TAKE 1 TABLET (40 MG TOTAL) BY MOUTH DAILY WITH BREAKFAST. 12/24/16  Yes [provider]  clonazePAM (KLONOPIN) 0.5 MG tablet Take 0.25 mg by mouth at bedtime. Takes half a tablet   Yes [provider]  gabapentin (NEURONTIN) 800 MG tablet Take 400 mg by mouth 3 (three) times daily. Takes half tablet   Yes [provider]  hydrochlorothiazide (HYDRODIURIL) 25 MG tablet Take 1 tablet (25 mg total) by mouth daily. 07/10/17  Yes Terald Sleeper, PA-C  hydrOXYzine (ATARAX/VISTARIL) 10 MG tablet TAKE 1 TO 3 TABLETS 3 TIMES A DAY AS NEEDED ANXIETY 12/24/16  Yes [provider]  ibuprofen (ADVIL,MOTRIN) 200 MG tablet Take 800 mg by mouth every 8 (eight) hours as needed for moderate pain.   Yes [provider]  methylphenidate (  RITALIN) 10 MG tablet TAKE 1 TABLET BY MOUTH TWICE DAILY AS NEEDED FOR ENERGY 05/30/17  Yes [provider]  Multiple Vitamins-Minerals (WOMENS DAILY FORMULA PO) Take 1 tablet by mouth every evening.    Yes [provider]  Ocrelizumab (OCREVUS IV) Inject 600 mg into the vein every 6 (six) months. Then 600 mg every 6 months. Next infusion due 09/2017   Yes [provider]  omeprazole (PRILOSEC) 20 MG capsule Take 1 capsule (20 mg total) by mouth daily. 03/07/17  Yes Terald Sleeper, PA-C  Oxycodone HCl 10 MG TABS Take 1 tablet (10 mg total) by mouth every 6 (six) hours as needed. Patient taking differently: Take 10 mg by mouth every 6 (six) hours as needed (pain).  07/10/17  Yes Terald Sleeper, PA-C  primidone (MYSOLINE) 250 MG tablet Take 1 tablet (250 mg total) by mouth every 8  (eight) hours. 04/01/16  Yes Kerrie Buffalo, NP  risedronate (ACTONEL) 150 MG tablet TAKE 1 TAB EVERY THIRTY DAYS WITH WATER ON AN EMPTY STOMACH, NOTHING BY MOUTH OR LIE DOWN FOR 30MINS 12/22/16  Yes [provider]  risperiDONE (RISPERDAL) 1 MG tablet Take 1 mg by mouth at bedtime.  07/11/16  Yes [provider]  traZODone (DESYREL) 50 MG tablet Take 1 tablet (50 mg total) by mouth at bedtime and may repeat dose one time if needed. Patient taking differently: Take 50 mg by mouth at bedtime.  04/01/16  Yes Kerrie Buffalo, NP  valACYclovir (VALTREX) 1000 MG tablet Take 1 tablet (1,000 mg total) by mouth 2 (two) times daily. Then stay on one tab daily as prevention Patient taking differently: Take 1,000 mg by mouth daily. Then stay on one tab daily as prevention 01/23/17  Yes Terald Sleeper, PA-C  Oxycodone HCl 10 MG TABS Take 1 tablet (10 mg total) by mouth 4 (four) times daily. 07/10/17   Terald Sleeper, PA-C  Oxycodone HCl 10 MG TABS Take 1 tablet (10 mg total) by mouth 4 (four) times daily. 07/10/17   Terald Sleeper, PA-C     Review of Systems  Positive ROS: neg  All other systems have been reviewed and were otherwise negative with the exception of those mentioned in the HPI and as above.  Objective: Vital signs in last 24 hours: Temp:  [98 F (36.7 C)] 98 F (36.7 C) (02/01 0600) Pulse Rate:  [55] 55 (02/01 0600) Resp:  [18] 18 (02/01 0600) BP: (118)/(79) 118/79 (02/01 0600) SpO2:  [100 %] 100 % (02/01 0600)  General Appearance: Alert, cooperative, no distress, appears stated age Head: Normocephalic, without obvious abnormality, atraumatic Eyes: PERRL, conjunctiva/corneas clear, EOM's intact      Neck: Supple, symmetrical, trachea midline, Back: Symmetric, no curvature, ROM normal, no CVA tenderness Lungs:  respirations unlabored Heart: Regular rate and rhythm Abdomen: Soft, non-tender Extremities: Extremities normal, atraumatic, no cyanosis or edema Pulses: 2+ and  symmetric all extremities Skin: Skin color, texture, turgor normal, no rashes or lesions  NEUROLOGIC:  Mental status: Alert and oriented x4, no aphasia, good attention span, fund of knowledge and memory  Motor Exam - grossly normal Sensory Exam - grossly normal Reflexes: trace Coordination - grossly normal Gait - grossly normal Balance - grossly normal Cranial Nerves: I: smell Not tested  II: visual acuity  OS: nl    OD: nl  II: visual fields Full to confrontation  II: pupils Equal, round, reactive to light  III,VII: ptosis None  III,IV,VI: extraocular muscles  Full  ROM  V: mastication Normal  V: facial light touch sensation  Normal  V,VII: corneal reflex  Present  VII: facial muscle function - upper  Normal  VII: facial muscle function - lower Normal  VIII: hearing Not tested  IX: soft palate elevation  Normal  IX,X: gag reflex Present  XI: trapezius strength  5/5  XI: sternocleidomastoid strength 5/5  XI: neck flexion strength  5/5  XII: tongue strength  Normal    Data Review Lab Results  Component Value Date   WBC 4.2 07/24/2017   HGB 13.6 07/24/2017   HCT 38.7 07/24/2017   MCV 99.2 07/24/2017   PLT 275 07/24/2017   Lab Results  Component Value Date   NA 126 (L) 07/24/2017   K 3.8 07/24/2017   CL 89 (L) 07/24/2017   CO2 26 07/24/2017   BUN 9 07/24/2017   CREATININE 0.80 07/24/2017   GLUCOSE 82 07/24/2017   Lab Results  Component Value Date   INR 0.99 07/24/2017    Assessment:   Cervical neck pain with herniated nucleus pulposus/ spondylosis/ stenosis at C4-5 C5-6. Estimated body mass index is 19.77 kg/m as calculated from the following:   Height as of 07/24/17: 5\' 7"  (1.702 m).   Weight as of 07/24/17: 57.2 kg (126 lb 3.2 oz).  Patient has failed conservative therapy. Planned surgery : ACDF C4-5 C5-6  Plan:   I explained the condition and procedure to the patient and answered any questions.  Patient wishes to proceed with procedure as planned.  Understands risks/ benefits/ and expected or typical outcomes.  Elloise Roark S 07/28/2017 6:44 AM

## 2017-07-29 DIAGNOSIS — M4722 Other spondylosis with radiculopathy, cervical region: Secondary | ICD-10-CM | POA: Diagnosis not present

## 2017-07-29 MED ORDER — METHOCARBAMOL 500 MG PO TABS: 500.0000 mg | ORAL_TABLET | Freq: Four times a day (QID) | ORAL | 1 refills | Status: DC | PRN

## 2017-07-29 MED ORDER — OXYCODONE HCL 10 MG PO TABS: 10.0000 mg | ORAL_TABLET | Freq: Four times a day (QID) | ORAL | 0 refills | Status: DC | PRN

## 2017-07-29 NOTE — Progress Notes (Signed)
Patient alert and oriented, mae's well, voiding adequate amount of urine, swallowing without difficulty, c/o mild pain at time of discharge. Patient discharged home with family. Script and discharged instructions given to patient. Patient and family stated understanding of instructions given. Patient has an appointment with Dr. Jones   

## 2017-07-29 NOTE — Discharge Summary (Signed)
Physician Discharge Summary  Patient ID: Angel Maddox MRN: [id number] DOB/AGE: 1955/11/30 62 y.o.  Admit date: 07/28/2017 Discharge date: 07/29/2017  Admission Diagnoses: cervical spondylosis    Discharge Diagnoses: same   Discharged Condition: good  Hospital Course: The patient was admitted on 07/28/2017 and taken to the operating room where the patient underwent ACDF. The patient tolerated the procedure well and was taken to the recovery room and then to the floor in stable condition. The hospital course was routine. There were no complications. The wound remained clean dry and intact. Pt had appropriate neck soreness. No complaints of arm pain or new N/T/W. The patient remained afebrile with stable vital signs, and tolerated a regular diet. The patient continued to increase activities, and pain was well controlled with oral pain medications.   Consults: None  Significant Diagnostic Studies:  Results for orders placed or performed during the hospital encounter of 07/24/17  Surgical pcr screen  Result Value Ref Range   MRSA, PCR NEGATIVE NEGATIVE   Staphylococcus aureus NEGATIVE NEGATIVE  Basic metabolic panel  Result Value Ref Range   Sodium 126 (L) 135 - 145 mmol/L   Potassium 3.8 3.5 - 5.1 mmol/L   Chloride 89 (L) 101 - 111 mmol/L   CO2 26 22 - 32 mmol/L   Glucose, Bld 82 65 - 99 mg/dL   BUN 9 6 - 20 mg/dL   Creatinine, Ser 0.80 0.44 - 1.00 mg/dL   Calcium 9.2 8.9 - 10.3 mg/dL   GFR calc non Af Amer >60 >60 mL/min   GFR calc Af Amer >60 >60 mL/min   Anion gap 11 5 - 15  CBC WITH DIFFERENTIAL  Result Value Ref Range   WBC 4.2 4.0 - 10.5 K/uL   RBC 3.90 3.87 - 5.11 MIL/uL   Hemoglobin 13.6 12.0 - 15.0 g/dL   HCT 38.7 36.0 - 46.0 %   MCV 99.2 78.0 - 100.0 fL   MCH 34.9 (H) 26.0 - 34.0 pg   MCHC 35.1 30.0 - 36.0 g/dL   RDW 11.8 11.5 - 15.5 %   Platelets 275 150 - 400 K/uL   Neutrophils Relative % 38 %   Neutro Abs 1.6 (L) 1.7 - 7.7 K/uL   Lymphocytes Relative 48 %    Lymphs Abs 2.0 0.7 - 4.0 K/uL   Monocytes Relative 11 %   Monocytes Absolute 0.5 0.1 - 1.0 K/uL   Eosinophils Relative 2 %   Eosinophils Absolute 0.1 0.0 - 0.7 K/uL   Basophils Relative 1 %   Basophils Absolute 0.0 0.0 - 0.1 K/uL  Protime-INR  Result Value Ref Range   Prothrombin Time 13.0 11.4 - 15.2 seconds   INR 0.99     Chest 2 View  Result Date: 07/25/2017 CLINICAL DATA:  Preoperative examination prior neck surgery in February 2019. Former smoker. History of hypertension EXAM: CHEST  2 VIEW COMPARISON:  PA and lateral chest x-ray of Nov 08, 2015 FINDINGS: The lungs are well-expanded and clear. The heart and pulmonary vascularity are normal. The mediastinum is normal in width. There is no pleural effusion. The bony thorax exhibits no acute abnormality. IMPRESSION: Mild hyperinflation may be voluntary or may reflect the patient's smoking history. There is no active cardiopulmonary disease. Electronically Signed   By: Chenoah Mcnally  [place] M.D.   On: 07/25/2017 07:07   Dg Cervical Spine 1 View  Result Date: 07/28/2017 CLINICAL DATA:  INTRAOPERATIVE FLUOROSCOPY: CERVICAL FOUR-FIVE, CERVICAL FIVE-SIX ANTERIOR CERVICAL DECOMPRESSION/DISCECTOMY FUSION RSTO: [facility] FLUORO TIME: 9 SECS  EXAM: CERVICAL SPINE 1 VIEW COMPARISON:  09/15/2016 FINDINGS: 2 intraoperative fluoroscopic lateral images document changes of instrumented ACDF C4-C6 without apparent complication. The cervicothoracic junction is not well visualized. IMPRESSION: ACDF C4-C6. Electronically Signed   By: Lucrezia Europe M.D.   On: 07/28/2017 09:23   Dg C-arm 1-60 Min  Result Date: 07/28/2017 CLINICAL DATA:  INTRAOPERATIVE FLUOROSCOPY: CERVICAL FOUR-FIVE, CERVICAL FIVE-SIX ANTERIOR CERVICAL DECOMPRESSION/DISCECTOMY FUSION RSTO: [facility] FLUORO TIME: 9 SECS EXAM: CERVICAL SPINE 1 VIEW COMPARISON:  09/15/2016 FINDINGS: 2 intraoperative fluoroscopic lateral images document changes of instrumented ACDF C4-C6 without apparent complication. The cervicothoracic  junction is not well visualized. IMPRESSION: ACDF C4-C6. Electronically Signed   By: Lucrezia Europe M.D.   On: 07/28/2017 09:23    Antibiotics:  Anti-infectives (From admission, onward)   Start     Dose/Rate Route Frequency Ordered Stop   07/28/17 1600  ceFAZolin (ANCEF) IVPB 2g/100 mL premix     2 g 200 mL/hr over 30 Minutes Intravenous Every 8 hours 07/28/17 1125 07/29/17 0200   07/28/17 1230  valACYclovir (VALTREX) tablet 1,000 mg    Comments:  Then stay on one tab daily as prevention     1,000 mg Oral Daily 07/28/17 1125     07/28/17 0900  bacitracin 50,000 Units in sodium chloride irrigation 0.9 % 500 mL irrigation  Status:  Discontinued       As needed 07/28/17 0901 07/28/17 0930   07/28/17 0634  ceFAZolin (ANCEF) IVPB 2g/100 mL premix     2 g 200 mL/hr over 30 Minutes Intravenous On call to O.R. 07/28/17 0175 07/28/17 0755      Discharge Exam: Blood pressure 105/75, pulse 77, temperature 99 F (37.2 C), resp. rate 16, height 5\' 7"  (1.702 m), weight 57.2 kg (126 lb), SpO2 98 %. Neurologic: Grossly normal Dressing dry and flat  Discharge Medications:   Allergies as of 07/29/2017   No Known Allergies     Medication List    TAKE these medications   buPROPion 300 MG 24 hr tablet Commonly known as:  WELLBUTRIN XL TAKE 1 TABLET EVERY DAY   calcium carbonate 1500 (600 Ca) MG Tabs tablet Commonly known as:  OSCAL Take 1,500 mg by mouth daily with breakfast.   citalopram 40 MG tablet Commonly known as:  CELEXA TAKE 1 TABLET (40 MG TOTAL) BY MOUTH DAILY WITH BREAKFAST.   clonazePAM 0.5 MG tablet Commonly known as:  KLONOPIN Take 0.25 mg by mouth at bedtime. Takes half a tablet   gabapentin 800 MG tablet Commonly known as:  NEURONTIN Take 400 mg by mouth 3 (three) times daily. Takes half tablet   hydrochlorothiazide 25 MG tablet Commonly known as:  HYDRODIURIL Take 1 tablet (25 mg total) by mouth daily.   hydrOXYzine 10 MG tablet Commonly known as:   ATARAX/VISTARIL TAKE 1 TO 3 TABLETS 3 TIMES A DAY AS NEEDED ANXIETY   ibuprofen 200 MG tablet Commonly known as:  ADVIL,MOTRIN Take 800 mg by mouth every 8 (eight) hours as needed for moderate pain.   methocarbamol 500 MG tablet Commonly known as:  ROBAXIN Take 1 tablet (500 mg total) by mouth every 6 (six) hours as needed for muscle spasms.   methylphenidate 10 MG tablet Commonly known as:  RITALIN TAKE 1 TABLET BY MOUTH TWICE DAILY AS NEEDED FOR ENERGY   OCREVUS IV Inject 600 mg into the vein every 6 (six) months. Then 600 mg every 6 months. Next infusion due 09/2017   omeprazole 20 MG capsule Commonly known  as:  PRILOSEC Take 1 capsule (20 mg total) by mouth daily.   Oxycodone HCl 10 MG Tabs Take 1 tablet (10 mg total) by mouth 4 (four) times daily. What changed:  Another medication with the same name was changed. Make sure you understand how and when to take each.   Oxycodone HCl 10 MG Tabs Take 1 tablet (10 mg total) by mouth 4 (four) times daily. What changed:  Another medication with the same name was changed. Make sure you understand how and when to take each.   Oxycodone HCl 10 MG Tabs Take 1 tablet (10 mg total) by mouth every 6 (six) hours as needed for severe pain ((score 7 to 10)). What changed:  reasons to take this   primidone 250 MG tablet Commonly known as:  MYSOLINE Take 1 tablet (250 mg total) by mouth every 8 (eight) hours.   risedronate 150 MG tablet Commonly known as:  ACTONEL TAKE 1 TAB EVERY THIRTY DAYS WITH WATER ON AN EMPTY STOMACH, NOTHING BY MOUTH OR LIE DOWN FOR 30MINS   risperiDONE 1 MG tablet Commonly known as:  RISPERDAL Take 1 mg by mouth at bedtime.   traZODone 50 MG tablet Commonly known as:  DESYREL Take 1 tablet (50 mg total) by mouth at bedtime and may repeat dose one time if needed. What changed:  when to take this   valACYclovir 1000 MG tablet Commonly known as:  VALTREX Take 1 tablet (1,000 mg total) by mouth 2 (two) times  daily. Then stay on one tab daily as prevention What changed:    when to take this  additional instructions   vitamin C 1000 MG tablet Take 1,000 mg by mouth daily.   WOMENS DAILY FORMULA PO Take 1 tablet by mouth every evening.       Disposition: home   Final Dx: ACDF C4-5 c5-6       Signed: Devontae Casasola S 07/29/2017, 9:11 AM

## 2017-07-31 ENCOUNTER — Encounter (HOSPITAL_COMMUNITY): Payer: Self-pay | Admitting: Neurological Surgery

## 2017-07-31 MED FILL — Thrombin For Soln 5000 Unit: CUTANEOUS | Qty: 5000 | Status: AC

## 2017-08-30 DIAGNOSIS — D72819 Decreased white blood cell count, unspecified: Secondary | ICD-10-CM | POA: Diagnosis not present

## 2017-10-03 ENCOUNTER — Encounter (HOSPITAL_COMMUNITY): Payer: Self-pay

## 2017-10-03 ENCOUNTER — Ambulatory Visit (HOSPITAL_COMMUNITY)
Admission: RE | Admit: 2017-10-03 | Discharge: 2017-10-03 | Disposition: A | Discharge status: Home / Self Care | Payer: Medicare Other | Source: Ambulatory Visit | Attending: Psychiatry | Admitting: Psychiatry

## 2017-10-03 DIAGNOSIS — G35 Multiple sclerosis: Secondary | ICD-10-CM | POA: Diagnosis not present

## 2017-10-03 MED ORDER — DIPHENHYDRAMINE HCL 50 MG/ML IJ SOLN
25.0000 mg | Freq: Once | INTRAMUSCULAR | Status: AC
  Administered 2017-10-03: 25 mg via INTRAVENOUS
  Filled 2017-10-03: qty 1

## 2017-10-03 MED ORDER — METHYLPREDNISOLONE SODIUM SUCC 125 MG IJ SOLR
100.0000 mg | Freq: Once | INTRAMUSCULAR | Status: AC
Start: 2017-10-03 — End: 2017-10-03
  Administered 2017-10-03: 100 mg via INTRAVENOUS
  Filled 2017-10-03: qty 2

## 2017-10-03 MED ORDER — ACETAMINOPHEN 500 MG PO TABS
1000.0000 mg | ORAL_TABLET | Freq: Once | ORAL | Status: AC
  Administered 2017-10-03: 1000 mg via ORAL
  Filled 2017-10-03: qty 2

## 2017-10-03 MED ORDER — SODIUM CHLORIDE 0.9 % IV SOLN
600.0000 mg | Freq: Once | INTRAVENOUS | Status: AC
  Administered 2017-10-03: 600 mg via INTRAVENOUS
  Filled 2017-10-03: qty 20

## 2017-10-03 MED ORDER — SODIUM CHLORIDE 0.9 % IV SOLN
Freq: Once | INTRAVENOUS | Status: AC
  Administered 2017-10-03: 07:00:00 via INTRAVENOUS

## 2017-10-03 NOTE — Discharge Instructions (Signed)
Ocrelizumab injection What is this medicine? OCRELIZUMAB (ok re LIZ ue mab) treats multiple sclerosis. It helps to decrease the number of multiple sclerosis relapses. It is not a cure. This medicine may be used for other purposes; ask your health care provider or pharmacist if you have questions. COMMON BRAND NAME(S): OCREVUS What should I tell my health care provider before I take this medicine? They need to know if you have any of these conditions: -cancer -hepatitis B infection -other infection (especially a virus infection such as chickenpox, cold sores, or herpes) -an unusual or allergic reaction to ocrelizumab, other medicines, foods, dyes or preservatives -pregnant or trying to get pregnant -breast-feeding How should I use this medicine? This medicine is for infusion into a vein. It is given by a health care professional in a hospital or clinic setting. Talk to your pediatrician regarding the use of this medicine in children. Special care may be needed. Overdosage: If you think you have taken too much of this medicine contact a poison control center or emergency room at once. NOTE: This medicine is only for you. Do not share this medicine with others. What if I miss a dose? Keep appointments for follow-up doses as directed. It is important not to miss your dose. Call your doctor or health care professional if you are unable to keep an appointment. What may interact with this medicine? -alemtuzumab -daclizumab -dimethyl fumarate -fingolimod -glatiramer -interferon beta -live virus vaccines -mitoxantrone -natalizumab -peginterferon beta -rituximab -steroid medicines like prednisone or cortisone -teriflunomide This list may not describe all possible interactions. Give your health care provider a list of all the medicines, herbs, non-prescription drugs, or dietary supplements you use. Also tell them if you smoke, drink alcohol, or use illegal drugs. Some items may interact with  your medicine. What should I watch for while using this medicine? Tell your doctor or healthcare professional if your symptoms do not start to get better or if they get worse. This medicine can cause serious allergic reactions. To reduce your risk you may need to take medicine before treatment with this medicine. Take your medicine as directed. Women should inform their doctor if they wish to become pregnant or think they might be pregnant. There is a potential for serious side effects to an unborn child. Talk to your health care professional or pharmacist for more information. Female patients should use effective birth control methods while receiving this medicine and for 6 months after the last dose. Call your doctor or health care professional for advice if you get a fever, chills or sore throat, or other symptoms of a cold or flu. Do not treat yourself. This drug decreases your body's ability to fight infections. Try to avoid being around people who are sick. If you have a hepatitis B infection or a history of a hepatitis B infection, talk to your doctor. The symptoms of hepatitis B may get worse if you take this medicine. In some patients, this medicine may cause a serious brain infection that may cause death. If you have any problems seeing, thinking, speaking, walking, or standing, tell your doctor right away. If you cannot reach your doctor, urgently seek other source of medical care. This medicine can decrease the response to a vaccine. If you need to get vaccinated, tell your healthcare professional if you have received this medicine. Extra booster doses may be needed. Talk to your doctor to see if a different vaccination schedule is needed. Talk to your doctor about your risk of cancer.   You may be more at risk for certain types of cancers if you take this medicine. What side effects may I notice from receiving this medicine? Side effects that you should report to your doctor or health care  professional as soon as possible: -allergic reactions like skin rash, itching or hives, swelling of the face, lips, or tongue -breathing problems -facial flushing -fast, irregular heartbeat -lump or soreness in the breast -signs and symptoms of herpes such as cold sore, shingles, or genital sores -signs and symptoms of infection like fever or chills, cough, sore throat, pain or trouble passing urine -signs and symptoms of low blood pressure like dizziness; feeling faint or lightheaded, falls; unusually weak or tired -signs and symptoms of progressive multifocal leukoencephalopathy (PML) like changes in vision; clumsiness; confusion; personality changes; weakness on one side of the body -swelling of the ankles, feet, hands Side effects that usually do not require medical attention (report these to your doctor or health care professional if they continue or are bothersome): -back pain -depressed mood -diarrhea -pain, redness, or irritation at site where injected This list may not describe all possible side effects. Call your doctor for medical advice about side effects. You may report side effects to FDA at 1-800-FDA-1088. Where should I keep my medicine? This drug is given in a hospital or clinic and will not be stored at home. NOTE: This sheet is a summary. It may not cover all possible information. If you have questions about this medicine, talk to your doctor, pharmacist, or health care provider.  2018 Elsevier/Gold Standard (2015-09-29 09:40:25)  

## 2017-10-10 ENCOUNTER — Ambulatory Visit: Payer: Medicare Other | Admitting: Physician Assistant

## 2017-12-08 ENCOUNTER — Encounter: Payer: Self-pay | Admitting: Physician Assistant

## 2017-12-08 ENCOUNTER — Ambulatory Visit (INDEPENDENT_AMBULATORY_CARE_PROVIDER_SITE_OTHER): Payer: Medicare Other | Admitting: Physician Assistant

## 2017-12-08 VITALS — BP 67/42 | HR 66 | Temp 98.4°F | Ht 67.0 in | Wt 129.0 lb

## 2017-12-08 DIAGNOSIS — S0990XA Unspecified injury of head, initial encounter: Secondary | ICD-10-CM | POA: Insufficient documentation

## 2017-12-08 DIAGNOSIS — I959 Hypotension, unspecified: Secondary | ICD-10-CM | POA: Diagnosis not present

## 2017-12-08 DIAGNOSIS — M5030 Other cervical disc degeneration, unspecified cervical region: Secondary | ICD-10-CM

## 2017-12-08 DIAGNOSIS — M5134 Other intervertebral disc degeneration, thoracic region: Secondary | ICD-10-CM

## 2017-12-08 DIAGNOSIS — M47812 Spondylosis without myelopathy or radiculopathy, cervical region: Secondary | ICD-10-CM

## 2017-12-08 DIAGNOSIS — M5136 Other intervertebral disc degeneration, lumbar region: Secondary | ICD-10-CM

## 2017-12-08 MED ORDER — OXYCODONE HCL 10 MG PO TABS: 10.0000 mg | ORAL_TABLET | Freq: Four times a day (QID) | ORAL | 0 refills | Status: DC

## 2017-12-08 MED ORDER — OXYCODONE HCL 10 MG PO TABS: 10.0000 mg | ORAL_TABLET | Freq: Four times a day (QID) | ORAL | 0 refills | Status: DC | PRN

## 2017-12-08 NOTE — Progress Notes (Signed)
BP (!) 67/42 (BP Location: Right Arm, Patient Position: Sitting, Cuff Size: Normal)   Pulse 66   Temp 98.4 F (36.9 C) (Oral)   Ht 5\' 7"  (1.702 m)   Wt 129 lb (58.5 kg)   BMI 20.20 kg/m    Subjective:    Patient ID: Angel Maddox, female    DOB: 1956-05-07, 62 y.o.   MRN: [id number]  HPI: Angel Maddox is a 62 y.o. female presenting on 12/08/2017 for Medication Refill  This patient comes in for 38-month recheck on her chronic medications.  She does extend out her chronic pain medicine for quite some time.  She is been able to go almost 5 months this time.  She did have surgery by Dr. Ronnald Ramp for cervical disc degeneration.  She is still followed by neurology for her MS.  Her neurologist is going to be out of the office for the next week 6 weeks related to his problem.  She was started on prednisone yesterday.  It is a very large taper to do over the next few weeks.  Her MS medication had not been working anymore.  She is very weak through her arms and legs.  Her blood pressure is quite low.  She has been taking hydrochlorothiazide still and does not drink very much.  I encouraged her to drink a lot, increase salt in her diet at this time, and hold the hydrochlorothiazide.  She is to give [place] a call back if her blood pressure does not come up.  She is going back to see her neurosurgeon next week.  We will plan to see her back in about a month to see how things are doing.  This patient also had a fall related to her MS and generalized weakness.  She hit her left forehead there is still some bruising in that area and it does go around the eye.  She does not have any significant swelling.  She does not disclose any concussion type symptoms.  Past Medical History:  Diagnosis Date  . Anxiety   . Arthritis    osteo  . Bipolar 1 disorder ([place])   . Cervicalgia   . Chronic kidney disease    kidney stone  . Depression   . Elevated liver enzymes    She says "normal now"  . GERD  (gastroesophageal reflux disease)   . H/O hiatal hernia   . Headache   . Hip fracture ([place]) 2017   left  . History of kidney stones    4-5 yrs ago  . Hypertension   . Multiple sclerosis ([place])     Had for 15 years  . Multiple sclerosis ([place])    dx 1999  . Tremors of nervous system    Relevant past medical, surgical, family and social history reviewed and updated as indicated. Interim medical history since our last visit reviewed. Allergies and medications reviewed and updated. DATA REVIEWED: CHART IN EPIC  Family History reviewed for pertinent findings.  Review of Systems  Constitutional: Negative.  Negative for activity change, fatigue and fever.  HENT: Negative.   Eyes: Negative.   Respiratory: Negative.  Negative for cough.   Cardiovascular: Negative.  Negative for chest pain.  Gastrointestinal: Negative.  Negative for abdominal pain.  Endocrine: Negative.   Genitourinary: Negative.  Negative for dysuria.  Musculoskeletal: Positive for arthralgias, back pain, gait problem, joint swelling and myalgias.  Skin: Negative.   Neurological: Positive for dizziness, tremors, weakness, light-headedness and numbness. Negative  for headaches.    Allergies as of 12/08/2017   No Known Allergies     Medication List        Accurate as of 12/08/17  4:36 PM. Always use your most recent med list.          buPROPion 300 MG 24 hr tablet Commonly known as:  WELLBUTRIN XL TAKE 1 TABLET EVERY DAY   calcium carbonate 1500 (600 Ca) MG Tabs tablet Commonly known as:  OSCAL Take 1,500 mg by mouth daily with breakfast.   citalopram 40 MG tablet Commonly known as:  CELEXA TAKE 1 TABLET (40 MG TOTAL) BY MOUTH DAILY WITH BREAKFAST.   clonazePAM 0.5 MG tablet Commonly known as:  KLONOPIN Take 0.25 mg by mouth at bedtime. Takes half a tablet   gabapentin 800 MG tablet Commonly known as:  NEURONTIN Take 400 mg by mouth 3 (three) times daily. Takes half tablet   hydrochlorothiazide 25 MG  tablet Commonly known as:  HYDRODIURIL Take 1 tablet (25 mg total) by mouth daily.   hydrOXYzine 10 MG tablet Commonly known as:  ATARAX/VISTARIL TAKE 1 TO 3 TABLETS 3 TIMES A DAY AS NEEDED ANXIETY   ibuprofen 200 MG tablet Commonly known as:  ADVIL,MOTRIN Take 800 mg by mouth every 8 (eight) hours as needed for moderate pain.   meloxicam 15 MG tablet Commonly known as:  MOBIC Take 15 mg by mouth daily.   methocarbamol 500 MG tablet Commonly known as:  ROBAXIN Take 1 tablet (500 mg total) by mouth every 6 (six) hours as needed for muscle spasms.   methylphenidate 10 MG tablet Commonly known as:  RITALIN TAKE 1 TABLET BY MOUTH TWICE DAILY AS NEEDED FOR ENERGY   OCREVUS IV Inject 600 mg into the vein every 6 (six) months. Then 600 mg every 6 months. Next infusion due 09/2017   omeprazole 20 MG capsule Commonly known as:  PRILOSEC Take 1 capsule (20 mg total) by mouth daily.   Oxycodone HCl 10 MG Tabs Take 1 tablet (10 mg total) by mouth every 6 (six) hours as needed ((score 7 to 10)).   Oxycodone HCl 10 MG Tabs Take 1 tablet (10 mg total) by mouth 4 (four) times daily.   Oxycodone HCl 10 MG Tabs Take 1 tablet (10 mg total) by mouth 4 (four) times daily.   predniSONE 10 MG tablet Commonly known as:  DELTASONE Take 10 mg by mouth.   primidone 250 MG tablet Commonly known as:  MYSOLINE Take 1 tablet (250 mg total) by mouth every 8 (eight) hours.   risedronate 150 MG tablet Commonly known as:  ACTONEL TAKE 1 TAB EVERY THIRTY DAYS WITH WATER ON AN EMPTY STOMACH, NOTHING BY MOUTH OR LIE DOWN FOR 30MINS   risperiDONE 1 MG tablet Commonly known as:  RISPERDAL Take 1 mg by mouth at bedtime.   tizanidine 2 MG capsule Commonly known as:  ZANAFLEX Take 2 mg by mouth 3 (three) times daily.   traZODone 50 MG tablet Commonly known as:  DESYREL Take 1 tablet (50 mg total) by mouth at bedtime and may repeat dose one time if needed.   valACYclovir 1000 MG tablet Commonly  known as:  VALTREX Take 1 tablet (1,000 mg total) by mouth 2 (two) times daily. Then stay on one tab daily as prevention   vitamin C 1000 MG tablet Take 1,000 mg by mouth daily.   WOMENS DAILY FORMULA PO Take 1 tablet by mouth every evening.  Objective:    BP (!) 67/42 (BP Location: Right Arm, Patient Position: Sitting, Cuff Size: Normal)   Pulse 66   Temp 98.4 F (36.9 C) (Oral)   Ht 5\' 7"  (1.702 m)   Wt 129 lb (58.5 kg)   BMI 20.20 kg/m   No Known Allergies  Wt Readings from Last 3 Encounters:  12/08/17 129 lb (58.5 kg)  10/03/17 123 lb (55.8 kg)  07/28/17 126 lb (57.2 kg)    Physical Exam  Constitutional: She is oriented to person, place, and time. She appears well-developed and well-nourished.  HENT:  Head: Normocephalic and atraumatic.  Eyes: Pupils are equal, round, and reactive to light. Conjunctivae and EOM are normal.  Cardiovascular: Normal rate, regular rhythm, normal heart sounds and intact distal pulses.  Pulmonary/Chest: Effort normal and breath sounds normal.  Abdominal: Soft. Bowel sounds are normal.  Musculoskeletal:       Thoracic back: She exhibits decreased range of motion, tenderness and pain.       Lumbar back: She exhibits decreased range of motion, tenderness and pain.  Neurological: She is alert and oriented to person, place, and time. She has normal reflexes. She displays tremor. No cranial nerve deficit. She displays no seizure activity.  Skin: Skin is warm and dry. No rash noted.  Psychiatric: She has a normal mood and affect. Her behavior is normal. Judgment and thought content normal.    Results for orders placed or performed during the hospital encounter of 07/24/17  Surgical pcr screen  Result Value Ref Range   MRSA, PCR NEGATIVE NEGATIVE   Staphylococcus aureus NEGATIVE NEGATIVE  Basic metabolic panel  Result Value Ref Range   Sodium 126 (L) 135 - 145 mmol/L   Potassium 3.8 3.5 - 5.1 mmol/L   Chloride 89 (L) 101 - 111  mmol/L   CO2 26 22 - 32 mmol/L   Glucose, Bld 82 65 - 99 mg/dL   BUN 9 6 - 20 mg/dL   Creatinine, Ser 0.80 0.44 - 1.00 mg/dL   Calcium 9.2 8.9 - 10.3 mg/dL   GFR calc non Af Amer >60 >60 mL/min   GFR calc Af Amer >60 >60 mL/min   Anion gap 11 5 - 15  CBC WITH DIFFERENTIAL  Result Value Ref Range   WBC 4.2 4.0 - 10.5 K/uL   RBC 3.90 3.87 - 5.11 MIL/uL   Hemoglobin 13.6 12.0 - 15.0 g/dL   HCT 38.7 36.0 - 46.0 %   MCV 99.2 78.0 - 100.0 fL   MCH 34.9 (H) 26.0 - 34.0 pg   MCHC 35.1 30.0 - 36.0 g/dL   RDW 11.8 11.5 - 15.5 %   Platelets 275 150 - 400 K/uL   Neutrophils Relative % 38 %   Neutro Abs 1.6 (L) 1.7 - 7.7 K/uL   Lymphocytes Relative 48 %   Lymphs Abs 2.0 0.7 - 4.0 K/uL   Monocytes Relative 11 %   Monocytes Absolute 0.5 0.1 - 1.0 K/uL   Eosinophils Relative 2 %   Eosinophils Absolute 0.1 0.0 - 0.7 K/uL   Basophils Relative 1 %   Basophils Absolute 0.0 0.0 - 0.1 K/uL  Protime-INR  Result Value Ref Range   Prothrombin Time 13.0 11.4 - 15.2 seconds   INR 0.99       Assessment & Plan:   1. Injury of head, initial encounter Continue slow movement upon standing  2. Hypotension, unspecified hypotension type Stop HCTZ  3. DDD (degenerative disc disease), cervical - Oxycodone HCl  10 MG TABS; Take 1 tablet (10 mg total) by mouth 4 (four) times daily.  Dispense: 120 tablet; Refill: 0 - Oxycodone HCl 10 MG TABS; Take 1 tablet (10 mg total) by mouth 4 (four) times daily.  Dispense: 120 tablet; Refill: 0  4. DDD (degenerative disc disease), thoracic  5. DDD (degenerative disc disease), lumbar - Oxycodone HCl 10 MG TABS; Take 1 tablet (10 mg total) by mouth 4 (four) times daily.  Dispense: 120 tablet; Refill: 0 - Oxycodone HCl 10 MG TABS; Take 1 tablet (10 mg total) by mouth 4 (four) times daily.  Dispense: 120 tablet; Refill: 0  6. Facet arthritis of cervical region - Oxycodone HCl 10 MG TABS; Take 1 tablet (10 mg total) by mouth 4 (four) times daily.  Dispense: 120 tablet;  Refill: 0 - Oxycodone HCl 10 MG TABS; Take 1 tablet (10 mg total) by mouth 4 (four) times daily.  Dispense: 120 tablet; Refill: 0   Continue all other maintenance medications as listed above.  Follow up plan: Return in about 1 month (around 01/05/2018) for recheck.  Educational handout given for [place] PA-C [facility] [address]  [place], [place] [address] [phone]   12/08/2017, 4:36 PM

## 2017-12-08 NOTE — Patient Instructions (Signed)
In a few days you may receive a survey in the mail or online from [employer] regarding your visit with us today. Please take a moment to fill this out. Your feedback is very important to our whole office. It can help us better understand your needs as well as improve your experience and satisfaction. Thank you for taking your time to complete it. We care about you.  Toree Edling, PA-C  

## 2017-12-21 ENCOUNTER — Telehealth: Payer: Self-pay | Admitting: Physician Assistant

## 2017-12-21 NOTE — Telephone Encounter (Signed)
A couple a weeks ago she saw Jones and her BP was real low and we could not get her pulse. Jones took her off BP meds. Went to back doctor today and BP 159/92 pulse was 65. Still has not taking anything. Please advise.

## 2017-12-21 NOTE — Telephone Encounter (Signed)
Attempted to contact patient - NVM 

## 2017-12-22 ENCOUNTER — Telehealth: Payer: Self-pay | Admitting: Physician Assistant

## 2017-12-22 NOTE — Telephone Encounter (Signed)
Patient states that her bp was 159/92 on Tuesday when she seen back doctor. Patient wants to know if she should start back on BP medication?

## 2017-12-22 NOTE — Telephone Encounter (Signed)
Then just keep track when she can. Maybe a friend can check it? Or the next few office visits wherever she goes

## 2017-12-22 NOTE — Telephone Encounter (Signed)
Lmtcb/ww  

## 2017-12-22 NOTE — Telephone Encounter (Signed)
Patient states she has no way to check it at home and she can not drive. I advised her that it would be hard for you to make that decision off of one reading.

## 2017-12-22 NOTE — Telephone Encounter (Signed)
See telephone call from 12/22/17.

## 2017-12-22 NOTE — Telephone Encounter (Signed)
Can she check it at home. I do not want to treat based on one elevated reading.  Maybe get more readings this next week?

## 2017-12-25 ENCOUNTER — Encounter: Payer: Self-pay | Admitting: *Deleted

## 2017-12-25 ENCOUNTER — Ambulatory Visit: Payer: Medicare Other | Admitting: *Deleted

## 2017-12-25 VITALS — BP 145/101 | HR 69

## 2017-12-25 DIAGNOSIS — Z0130 Encounter for examination of blood pressure without abnormal findings: Secondary | ICD-10-CM

## 2017-12-25 NOTE — Progress Notes (Signed)
Pt here for BP check BP 146 93 P 67  Recheck  BP 145 101 P 69    Per Particia Nearing pt should restart HCTZ 25 mg  Pt has appt with Particia Nearing on 01/08/2018

## 2017-12-25 NOTE — Telephone Encounter (Signed)
Pt came in for BP check Restart HCTZ per Particia Nearing

## 2018-01-08 ENCOUNTER — Encounter: Payer: Self-pay | Admitting: Physician Assistant

## 2018-01-08 ENCOUNTER — Ambulatory Visit (INDEPENDENT_AMBULATORY_CARE_PROVIDER_SITE_OTHER): Payer: Medicare Other | Admitting: Physician Assistant

## 2018-01-08 VITALS — BP 83/55 | HR 80 | Temp 99.8°F | Ht 67.0 in | Wt 129.0 lb

## 2018-01-08 DIAGNOSIS — M47812 Spondylosis without myelopathy or radiculopathy, cervical region: Secondary | ICD-10-CM

## 2018-01-08 DIAGNOSIS — M5136 Other intervertebral disc degeneration, lumbar region: Secondary | ICD-10-CM | POA: Diagnosis not present

## 2018-01-08 DIAGNOSIS — M5030 Other cervical disc degeneration, unspecified cervical region: Secondary | ICD-10-CM

## 2018-01-08 DIAGNOSIS — I959 Hypotension, unspecified: Secondary | ICD-10-CM

## 2018-01-08 DIAGNOSIS — G35 Multiple sclerosis: Secondary | ICD-10-CM | POA: Diagnosis not present

## 2018-01-08 MED ORDER — HYDROCHLOROTHIAZIDE 25 MG PO TABS: 12.5000 mg | ORAL_TABLET | Freq: Every day | ORAL | 3 refills | Status: DC

## 2018-01-08 NOTE — Progress Notes (Signed)
BP (!) 83/55   Pulse 80   Temp 99.8 F (37.7 C) (Oral)   Ht 5\' 7"  (1.702 m)   Wt 129 lb (58.5 kg)   BMI 20.20 kg/m    Subjective:    Patient ID: Angel Maddox, female    DOB: February 02, 1956, 62 y.o.   MRN: [id number]  HPI: Angel Maddox is a 62 y.o. female presenting on 01/08/2018 for Hypertension (1 month follow up ) and Multiple Sclerosis  This patient comes in for periodic recheck on medications and conditions including hypotension, fatigue, MS, DDD and joint pain. She has been having weakness and dizziness about standing . When she stopped the HCTZ her BP went up too high.  We have adjusted to her medications at this time to reduce the hydrochlorothiazide to half a tablet daily.  We will also discontinue any hydroxyzine As that might be adding to her hypertension.  She will also reduce muscle relaxant as much as possible.  All medications are reviewed today. There are no reports of any problems with the medications. All of the medical conditions are reviewed and updated.  Lab work is reviewed and will be ordered as medically necessary. There are no new problems reported with today's visit.  Past Medical History:  Diagnosis Date  . Anxiety   . Arthritis    osteo  . Bipolar 1 disorder ([place])   . Cervicalgia   . Chronic kidney disease    kidney stone  . Depression   . Elevated liver enzymes    She says "normal now"  . GERD (gastroesophageal reflux disease)   . H/O hiatal hernia   . Headache   . Hip fracture ([place]) 2017   left  . History of kidney stones    4-5 yrs ago  . Hypertension   . Multiple sclerosis ([place])     Had for 15 years  . Multiple sclerosis ([place])    dx 1999  . Tremors of nervous system    Relevant past medical, surgical, family and social history reviewed and updated as indicated. Interim medical history since our last visit reviewed. Allergies and medications reviewed and updated. DATA REVIEWED: CHART IN EPIC  Family History reviewed for pertinent  findings.  Review of Systems  Constitutional: Positive for fatigue. Negative for activity change and fever.  HENT: Negative.   Eyes: Negative.   Respiratory: Negative.  Negative for cough.   Cardiovascular: Negative.  Negative for chest pain.  Gastrointestinal: Negative.  Negative for abdominal pain.  Endocrine: Negative.   Genitourinary: Negative.  Negative for dysuria.  Musculoskeletal: Negative.   Skin: Negative.   Neurological: Positive for weakness.  Hematological: Negative.     Allergies as of 01/08/2018   No Known Allergies     Medication List        Accurate as of 01/08/18 10:27 PM. Always use your most recent med list.          buPROPion 300 MG 24 hr tablet Commonly known as:  WELLBUTRIN XL TAKE 1 TABLET EVERY DAY   calcium carbonate 1500 (600 Ca) MG Tabs tablet Commonly known as:  OSCAL Take 1,500 mg by mouth daily with breakfast.   citalopram 40 MG tablet Commonly known as:  CELEXA TAKE 1 TABLET (40 MG TOTAL) BY MOUTH DAILY WITH BREAKFAST.   clonazePAM 0.5 MG tablet Commonly known as:  KLONOPIN Take 0.25 mg by mouth at bedtime. Takes half a tablet   gabapentin 800 MG tablet Commonly known as:  NEURONTIN  Take 400 mg by mouth 3 (three) times daily. Takes half tablet   hydrochlorothiazide 25 MG tablet Commonly known as:  HYDRODIURIL Take 0.5 tablets (12.5 mg total) by mouth daily.   ibuprofen 200 MG tablet Commonly known as:  ADVIL,MOTRIN Take 800 mg by mouth every 8 (eight) hours as needed for moderate pain.   meloxicam 15 MG tablet Commonly known as:  MOBIC Take 15 mg by mouth daily.   methylphenidate 10 MG tablet Commonly known as:  RITALIN TAKE 1 TABLET BY MOUTH TWICE DAILY AS NEEDED FOR ENERGY   OCREVUS IV Inject 600 mg into the vein every 6 (six) months. Then 600 mg every 6 months. Next infusion due 09/2017   omeprazole 20 MG capsule Commonly known as:  PRILOSEC Take 1 capsule (20 mg total) by mouth daily.   Oxycodone HCl 10 MG  Tabs Take 1 tablet (10 mg total) by mouth every 6 (six) hours as needed ((score 7 to 10)).   Oxycodone HCl 10 MG Tabs Take 1 tablet (10 mg total) by mouth 4 (four) times daily.   Oxycodone HCl 10 MG Tabs Take 1 tablet (10 mg total) by mouth 4 (four) times daily.   primidone 250 MG tablet Commonly known as:  MYSOLINE Take 1 tablet (250 mg total) by mouth every 8 (eight) hours.   risedronate 150 MG tablet Commonly known as:  ACTONEL TAKE 1 TAB EVERY THIRTY DAYS WITH WATER ON AN EMPTY STOMACH, NOTHING BY MOUTH OR LIE DOWN FOR 30MINS   risperiDONE 1 MG tablet Commonly known as:  RISPERDAL Take 1 mg by mouth at bedtime.   tizanidine 2 MG capsule Commonly known as:  ZANAFLEX Take 2 mg by mouth 3 (three) times daily.   traZODone 50 MG tablet Commonly known as:  DESYREL Take 1 tablet (50 mg total) by mouth at bedtime and may repeat dose one time if needed.   valACYclovir 1000 MG tablet Commonly known as:  VALTREX Take 1 tablet (1,000 mg total) by mouth 2 (two) times daily. Then stay on one tab daily as prevention   vitamin C 1000 MG tablet Take 1,000 mg by mouth daily.   WOMENS DAILY FORMULA PO Take 1 tablet by mouth every evening.          Objective:    BP (!) 83/55   Pulse 80   Temp 99.8 F (37.7 C) (Oral)   Ht 5\' 7"  (1.702 m)   Wt 129 lb (58.5 kg)   BMI 20.20 kg/m   No Known Allergies  Wt Readings from Last 3 Encounters:  01/08/18 129 lb (58.5 kg)  12/08/17 129 lb (58.5 kg)  10/03/17 123 lb (55.8 kg)    Physical Exam  Constitutional: She is oriented to person, place, and time. She appears well-developed and well-nourished.  HENT:  Head: Normocephalic and atraumatic.  Eyes: Pupils are equal, round, and reactive to light. Conjunctivae and EOM are normal.  Cardiovascular: Normal rate, regular rhythm, normal heart sounds and intact distal pulses.  Pulmonary/Chest: Effort normal and breath sounds normal.  Abdominal: Soft. Bowel sounds are normal.   Neurological: She is alert and oriented to person, place, and time. She has normal reflexes.  Skin: Skin is warm and dry. No rash noted.  Psychiatric: She has a normal mood and affect. Her behavior is normal. Judgment and thought content normal.    Results for orders placed or performed during the hospital encounter of 07/24/17  Surgical pcr screen  Result Value Ref Range  MRSA, PCR NEGATIVE NEGATIVE   Staphylococcus aureus NEGATIVE NEGATIVE  Basic metabolic panel  Result Value Ref Range   Sodium 126 (L) 135 - 145 mmol/L   Potassium 3.8 3.5 - 5.1 mmol/L   Chloride 89 (L) 101 - 111 mmol/L   CO2 26 22 - 32 mmol/L   Glucose, Bld 82 65 - 99 mg/dL   BUN 9 6 - 20 mg/dL   Creatinine, Ser 0.80 0.44 - 1.00 mg/dL   Calcium 9.2 8.9 - 10.3 mg/dL   GFR calc non Af Amer >60 >60 mL/min   GFR calc Af Amer >60 >60 mL/min   Anion gap 11 5 - 15  CBC WITH DIFFERENTIAL  Result Value Ref Range   WBC 4.2 4.0 - 10.5 K/uL   RBC 3.90 3.87 - 5.11 MIL/uL   Hemoglobin 13.6 12.0 - 15.0 g/dL   HCT 38.7 36.0 - 46.0 %   MCV 99.2 78.0 - 100.0 fL   MCH 34.9 (H) 26.0 - 34.0 pg   MCHC 35.1 30.0 - 36.0 g/dL   RDW 11.8 11.5 - 15.5 %   Platelets 275 150 - 400 K/uL   Neutrophils Relative % 38 %   Neutro Abs 1.6 (L) 1.7 - 7.7 K/uL   Lymphocytes Relative 48 %   Lymphs Abs 2.0 0.7 - 4.0 K/uL   Monocytes Relative 11 %   Monocytes Absolute 0.5 0.1 - 1.0 K/uL   Eosinophils Relative 2 %   Eosinophils Absolute 0.1 0.0 - 0.7 K/uL   Basophils Relative 1 %   Basophils Absolute 0.0 0.0 - 0.1 K/uL  Protime-INR  Result Value Ref Range   Prothrombin Time 13.0 11.4 - 15.2 seconds   INR 0.99       Assessment & Plan:   1. DDD (degenerative disc disease), cervical Continue medications  2. DDD (degenerative disc disease), lumbar Continue medications  3. Facet arthritis of cervical region Continue medications  4. MS (multiple sclerosis) (H[facility]) Continue medications  5. Hypotension, unspecified hypotension  type reduce - hydrochlorothiazide (HYDRODIURIL) 25 MG tablet; Take 0.5 tablets (12.5 mg total) by mouth daily.  Dispense: 90 tablet; Refill: 3   Continue all other maintenance medications as listed above.  Follow up plan: Return in about 1 month (around 02/05/2018) for recheck, NURSE Visit 1 week.  Educational handout given for [place] PA-C [facility] [address]  [place], [place] [address] [phone]   01/08/2018, 10:27 PM

## 2018-01-15 ENCOUNTER — Ambulatory Visit: Payer: Medicare Other | Admitting: *Deleted

## 2018-01-15 VITALS — BP 115/71 | HR 70

## 2018-01-15 DIAGNOSIS — Z0130 Encounter for examination of blood pressure without abnormal findings: Secondary | ICD-10-CM

## 2018-01-15 NOTE — Progress Notes (Signed)
Pt here for BP check bp 115 71 p 70  Per Particia Nearing we will rck at next follow up appt Continue current meds

## 2018-02-12 ENCOUNTER — Telehealth: Payer: Self-pay | Admitting: Physician Assistant

## 2018-02-12 NOTE — Telephone Encounter (Signed)
Patient states that her hydrochlorothiazide was changed to half a tablet on 7/15 since her bp was running low.  States that since then her bp has been running high.  149/101 this morning - patient took bp while we were on the phone and it was 169/101.  States that the last 3 days she has been taking one whole tablet of her hydrochlorothiazide since her bp has been running high. Please advise

## 2018-02-12 NOTE — Telephone Encounter (Signed)
Okay to go back to 1 whole tablet daily and keep a check on her blood pressure and report to [place] in a few days.

## 2018-02-12 NOTE — Telephone Encounter (Signed)
lmtcb

## 2018-02-12 NOTE — Telephone Encounter (Signed)
Patient aware of recommendations and will call back on Thursday with readings.

## 2018-02-13 ENCOUNTER — Other Ambulatory Visit: Payer: Self-pay | Admitting: Psychiatry

## 2018-02-13 DIAGNOSIS — G35 Multiple sclerosis: Secondary | ICD-10-CM

## 2018-02-24 ENCOUNTER — Other Ambulatory Visit: Payer: Self-pay | Admitting: Physician Assistant

## 2018-02-24 DIAGNOSIS — B009 Herpesviral infection, unspecified: Secondary | ICD-10-CM

## 2018-02-24 DIAGNOSIS — R3989 Other symptoms and signs involving the genitourinary system: Secondary | ICD-10-CM

## 2018-03-27 ENCOUNTER — Ambulatory Visit
Admission: RE | Admit: 2018-03-27 | Discharge: 2018-03-27 | Disposition: A | Discharge status: Home / Self Care | Payer: Medicare Other | Source: Ambulatory Visit | Attending: Psychiatry | Admitting: Psychiatry

## 2018-03-27 DIAGNOSIS — G35 Multiple sclerosis: Secondary | ICD-10-CM

## 2018-03-27 MED ORDER — GADOBENATE DIMEGLUMINE 529 MG/ML IV SOLN
12.0000 mL | Freq: Once | INTRAVENOUS | Status: AC | PRN
  Administered 2018-03-27: 12 mL via INTRAVENOUS

## 2018-04-02 ENCOUNTER — Other Ambulatory Visit: Payer: Self-pay

## 2018-04-02 MED ORDER — RISPERIDONE 1 MG PO TABS: 1.0000 mg | ORAL_TABLET | Freq: Every day | ORAL | 1 refills | Status: DC

## 2018-04-03 ENCOUNTER — Ambulatory Visit (INDEPENDENT_AMBULATORY_CARE_PROVIDER_SITE_OTHER): Payer: Medicare Other | Admitting: Psychiatry

## 2018-04-03 DIAGNOSIS — F331 Major depressive disorder, recurrent, moderate: Secondary | ICD-10-CM | POA: Diagnosis not present

## 2018-04-03 NOTE — Progress Notes (Signed)
      [facility] Counselor/Therapist Progress Note   Patient ID: Angel Maddox, MRN: [id number]  Date: 04/03/2018  Timespent: 60 minutes  Treatment Type: Individual  Subjective: Patient in today after not being seen in several weeks.  Was not able to get here for appts as husband has been out with back surgery and patient does not drive due to her MS symptoms.  Missed coming here to talk through her depression, anxiety, and especially her grief re: loss of adult son.  Did process a lot of her sadness today as well as her fears of the future.  Tearful, and tending to get caught up in negative thought patterns.  Tends to worry a lot which further complicates her situations and contributes to a lot of fatigue for patient.  Working to transition from worrying to being a person of concern, as that mindset offers a more proactive and positive view in looking for what might go well versus always looking and worrying about what will go wrong.  Acknowledged some of her strengths as patient has not felt much strength lately, particularly emotional strength.  Wants to schedule a sooner appt this time so is requesting approx 2 weeks out.  Interventions:CBT, Solution Focused, Strength-based, Supportive and Reframing  Mental Status Exam:   Appearance:   Casual     Behavior:  Sharing and Agitated  Motor:  Tremor  Speech/Language:   Normal Rate  Affect:  Negative, Depressed and Tearful  Mood:  depressed  Thought process:  Coherent and Relevant  Thought content:    Logical  Perceptual disturbances:    Normal  Orientation:  Full (Time, Place, and Person)  Attention:  Good  Concentration:  good  Memory:  Immediate  Fund of knowledge:   Good  Insight:    Fair  Judgment:   Good  Impulse Control:  good    Reported Symptoms: Depressed, sadness, tearfulness, worrying, overthinking, negativity.  Risk Assessment: Danger to Self:  No Self-injurious Behavior: No Danger to Others: No Duty to  Warn:no Physical Aggression / Violence:No  Access to Firearms a concern: No  Gang Involvement:No   Diagnosis:   ICD-10-CM   1. Major depressive disorder, recurrent episode, moderate ([place]) F33.1      Plan: Plan to see patient in approx 2 wks to continue goal-directed treatment  [name], LCSW

## 2018-04-06 ENCOUNTER — Ambulatory Visit (HOSPITAL_COMMUNITY): Payer: Medicare Other

## 2018-04-09 ENCOUNTER — Ambulatory Visit: Payer: Self-pay | Admitting: Physician Assistant

## 2018-04-10 ENCOUNTER — Encounter: Payer: Self-pay | Admitting: Physician Assistant

## 2018-04-10 ENCOUNTER — Ambulatory Visit (INDEPENDENT_AMBULATORY_CARE_PROVIDER_SITE_OTHER): Payer: Medicare Other | Admitting: Physician Assistant

## 2018-04-10 VITALS — BP 147/101 | HR 70 | Temp 98.9°F | Ht 67.0 in | Wt 134.6 lb

## 2018-04-10 DIAGNOSIS — M47812 Spondylosis without myelopathy or radiculopathy, cervical region: Secondary | ICD-10-CM | POA: Diagnosis not present

## 2018-04-10 DIAGNOSIS — G35 Multiple sclerosis: Secondary | ICD-10-CM | POA: Diagnosis not present

## 2018-04-10 DIAGNOSIS — M5030 Other cervical disc degeneration, unspecified cervical region: Secondary | ICD-10-CM

## 2018-04-10 DIAGNOSIS — M5136 Other intervertebral disc degeneration, lumbar region: Secondary | ICD-10-CM

## 2018-04-10 MED ORDER — OXYCODONE HCL 10 MG PO TABS: 10.0000 mg | ORAL_TABLET | Freq: Four times a day (QID) | ORAL | 0 refills | Status: DC

## 2018-04-10 MED ORDER — OXYCODONE HCL 10 MG PO TABS: 10.0000 mg | ORAL_TABLET | Freq: Four times a day (QID) | ORAL | 0 refills | Status: DC | PRN

## 2018-04-10 NOTE — Progress Notes (Signed)
BP (!) 147/101   Pulse 70   Temp 98.9 F (37.2 C) (Oral)   Ht 5\' 7"  (1.702 m)   Wt 134 lb 9.6 oz (61.1 kg)   BMI 21.08 kg/m    Subjective:    Patient ID: Angel Maddox, female    DOB: 09-03-1955, 62 y.o.   MRN: [id number]  HPI: Angel Maddox is a 62 y.o. female presenting on 04/10/2018 for Multiple Sclerosis  She has been having different treatments and has not really been doing well at all. She has to get a new medication approved so she has gotten quite out of control with symptoms. They are trying adderall for her excessive fatigue. All medications are reviewed and refilled as needed. Pain contrac tis brought up to date.  Past Medical History:  Diagnosis Date  . Anxiety   . Arthritis    osteo  . Bipolar 1 disorder ([place])   . Cervicalgia   . Chronic kidney disease    kidney stone  . Depression   . Elevated liver enzymes    She says "normal now"  . GERD (gastroesophageal reflux disease)   . H/O hiatal hernia   . Headache   . Hip fracture ([place]) 2017   left  . History of kidney stones    4-5 yrs ago  . Hypertension   . Multiple sclerosis ([place])     Had for 15 years  . Multiple sclerosis ([place])    dx 1999  . Tremors of nervous system    Relevant past medical, surgical, family and social history reviewed and updated as indicated. Interim medical history since our last visit reviewed. Allergies and medications reviewed and updated. DATA REVIEWED: CHART IN EPIC  Family History reviewed for pertinent findings.  Review of Systems  Constitutional: Negative.   HENT: Negative.   Eyes: Negative.   Respiratory: Negative.   Gastrointestinal: Negative.   Genitourinary: Negative.   Musculoskeletal: Positive for arthralgias, gait problem and joint swelling.  Neurological: Positive for dizziness, weakness and numbness.    Allergies as of 04/10/2018   No Known Allergies     Medication List        Accurate as of 04/10/18 10:00 PM. Always use your most recent med  list.          buPROPion 300 MG 24 hr tablet Commonly known as:  WELLBUTRIN XL TAKE 1 TABLET EVERY DAY   calcium carbonate 1500 (600 Ca) MG Tabs tablet Commonly known as:  OSCAL Take 1,500 mg by mouth daily with breakfast.   citalopram 40 MG tablet Commonly known as:  CELEXA TAKE 1 TABLET (40 MG TOTAL) BY MOUTH DAILY WITH BREAKFAST.   clonazePAM 0.5 MG tablet Commonly known as:  KLONOPIN Take 0.25 mg by mouth at bedtime. Takes half a tablet   gabapentin 800 MG tablet Commonly known as:  NEURONTIN Take 400 mg by mouth 3 (three) times daily. Takes half tablet   hydrochlorothiazide 25 MG tablet Commonly known as:  HYDRODIURIL Take 0.5 tablets (12.5 mg total) by mouth daily.   ibuprofen 200 MG tablet Commonly known as:  ADVIL,MOTRIN Take 800 mg by mouth every 8 (eight) hours as needed for moderate pain.   meloxicam 15 MG tablet Commonly known as:  MOBIC Take 15 mg by mouth daily.   methylphenidate 20 MG tablet Commonly known as:  RITALIN   OCREVUS IV Inject 600 mg into the vein every 6 (six) months. Then 600 mg every 6 months. Next infusion due 09/2017  omeprazole 20 MG capsule Commonly known as:  PRILOSEC Take 1 capsule (20 mg total) by mouth daily.   Oxycodone HCl 10 MG Tabs Take 1 tablet (10 mg total) by mouth every 6 (six) hours as needed ((score 7 to 10)).   Oxycodone HCl 10 MG Tabs Take 1 tablet (10 mg total) by mouth 4 (four) times daily.   Oxycodone HCl 10 MG Tabs Take 1 tablet (10 mg total) by mouth 4 (four) times daily.   primidone 250 MG tablet Commonly known as:  MYSOLINE Take 1 tablet (250 mg total) by mouth every 8 (eight) hours.   risedronate 150 MG tablet Commonly known as:  ACTONEL TAKE 1 TAB EVERY THIRTY DAYS WITH WATER ON AN EMPTY STOMACH, NOTHING BY MOUTH OR LIE DOWN FOR 30MINS   risperiDONE 1 MG tablet Commonly known as:  RISPERDAL Take 1 tablet (1 mg total) by mouth at bedtime.   tizanidine 2 MG capsule Commonly known as:   ZANAFLEX Take 2 mg by mouth 3 (three) times daily.   traZODone 50 MG tablet Commonly known as:  DESYREL Take 1 tablet (50 mg total) by mouth at bedtime and may repeat dose one time if needed.   valACYclovir 1000 MG tablet Commonly known as:  VALTREX TAKE 1 TABLET 2 TIMES DAILY. THEN STAY ON ONE TABLET DAILY AS PREVENTION   vitamin C 1000 MG tablet Take 1,000 mg by mouth daily.   WOMENS DAILY FORMULA PO Take 1 tablet by mouth every evening.          Objective:    BP (!) 147/101   Pulse 70   Temp 98.9 F (37.2 C) (Oral)   Ht 5\' 7"  (1.702 m)   Wt 134 lb 9.6 oz (61.1 kg)   BMI 21.08 kg/m   No Known Allergies  Wt Readings from Last 3 Encounters:  04/10/18 134 lb 9.6 oz (61.1 kg)  01/08/18 129 lb (58.5 kg)  12/08/17 129 lb (58.5 kg)    Physical Exam  Constitutional: She is oriented to person, place, and time. She appears well-developed and well-nourished.  HENT:  Head: Normocephalic and atraumatic.  Eyes: Pupils are equal, round, and reactive to light. Conjunctivae and EOM are normal.  Cardiovascular: Normal rate, regular rhythm, normal heart sounds and intact distal pulses.  Pulmonary/Chest: Effort normal and breath sounds normal.  Abdominal: Soft. Bowel sounds are normal.  Neurological: She is alert and oriented to person, place, and time. She has normal reflexes.  Skin: Skin is warm and dry. No rash noted.  Psychiatric: She has a normal mood and affect. Her behavior is normal. Judgment and thought content normal.    Results for orders placed or performed during the hospital encounter of 07/24/17  Surgical pcr screen  Result Value Ref Range   MRSA, PCR NEGATIVE NEGATIVE   Staphylococcus aureus NEGATIVE NEGATIVE  Basic metabolic panel  Result Value Ref Range   Sodium 126 (L) 135 - 145 mmol/L   Potassium 3.8 3.5 - 5.1 mmol/L   Chloride 89 (L) 101 - 111 mmol/L   CO2 26 22 - 32 mmol/L   Glucose, Bld 82 65 - 99 mg/dL   BUN 9 6 - 20 mg/dL   Creatinine, Ser 0.80  0.44 - 1.00 mg/dL   Calcium 9.2 8.9 - 10.3 mg/dL   GFR calc non Af Amer >60 >60 mL/min   GFR calc Af Amer >60 >60 mL/min   Anion gap 11 5 - 15  CBC WITH DIFFERENTIAL  Result Value  Ref Range   WBC 4.2 4.0 - 10.5 K/uL   RBC 3.90 3.87 - 5.11 MIL/uL   Hemoglobin 13.6 12.0 - 15.0 g/dL   HCT 38.7 36.0 - 46.0 %   MCV 99.2 78.0 - 100.0 fL   MCH 34.9 (H) 26.0 - 34.0 pg   MCHC 35.1 30.0 - 36.0 g/dL   RDW 11.8 11.5 - 15.5 %   Platelets 275 150 - 400 K/uL   Neutrophils Relative % 38 %   Neutro Abs 1.6 (L) 1.7 - 7.7 K/uL   Lymphocytes Relative 48 %   Lymphs Abs 2.0 0.7 - 4.0 K/uL   Monocytes Relative 11 %   Monocytes Absolute 0.5 0.1 - 1.0 K/uL   Eosinophils Relative 2 %   Eosinophils Absolute 0.1 0.0 - 0.7 K/uL   Basophils Relative 1 %   Basophils Absolute 0.0 0.0 - 0.1 K/uL  Protime-INR  Result Value Ref Range   Prothrombin Time 13.0 11.4 - 15.2 seconds   INR 0.99       Assessment & Plan:   1. DDD (degenerative disc disease), cervical - Oxycodone HCl 10 MG TABS; Take 1 tablet (10 mg total) by mouth every 6 (six) hours as needed ((score 7 to 10)).  Dispense: 120 tablet; Refill: 0 - Oxycodone HCl 10 MG TABS; Take 1 tablet (10 mg total) by mouth 4 (four) times daily.  Dispense: 120 tablet; Refill: 0 - Oxycodone HCl 10 MG TABS; Take 1 tablet (10 mg total) by mouth 4 (four) times daily.  Dispense: 120 tablet; Refill: 0  2. DDD (degenerative disc disease), lumbar - Oxycodone HCl 10 MG TABS; Take 1 tablet (10 mg total) by mouth every 6 (six) hours as needed ((score 7 to 10)).  Dispense: 120 tablet; Refill: 0 - Oxycodone HCl 10 MG TABS; Take 1 tablet (10 mg total) by mouth 4 (four) times daily.  Dispense: 120 tablet; Refill: 0 - Oxycodone HCl 10 MG TABS; Take 1 tablet (10 mg total) by mouth 4 (four) times daily.  Dispense: 120 tablet; Refill: 0  3. Facet arthritis of cervical region - Oxycodone HCl 10 MG TABS; Take 1 tablet (10 mg total) by mouth every 6 (six) hours as needed ((score 7 to  10)).  Dispense: 120 tablet; Refill: 0 - Oxycodone HCl 10 MG TABS; Take 1 tablet (10 mg total) by mouth 4 (four) times daily.  Dispense: 120 tablet; Refill: 0 - Oxycodone HCl 10 MG TABS; Take 1 tablet (10 mg total) by mouth 4 (four) times daily.  Dispense: 120 tablet; Refill: 0  4. MS (multiple sclerosis) (H[facility]) - methylphenidate (RITALIN) 20 MG tablet   Continue all other maintenance medications as listed above.  Follow up plan: Return in about 3 months (around 07/11/2018) for recheck.  Educational handout given for [place] PA-C [facility] [address]  [place], [place] [address] [phone]   04/10/2018, 10:00 PM

## 2018-04-13 IMAGING — MR MR HEAD WO/W CM
14 series · 48 of 48 positions shown · IV contrast (multihance)
Comparison: MRI head March 29, 2010

CLINICAL DATA: Increasing tremors, difficulty walking and talking.
Extremity weakness and numbness. Headache. History of multiple
sclerosis.

EXAM:
MRI HEAD WITHOUT AND WITH CONTRAST
TECHNIQUE: Multiplanar, multiecho pulse sequences of the brain and surrounding
structures were obtained without and with intravenous contrast.
CONTRAST:  10 cc MultiHance

[Series 2: t1_se_sag · sagittal · 5.0mm · 0.45mm/px · 1 of 24 slices shown]
[im 1/24]
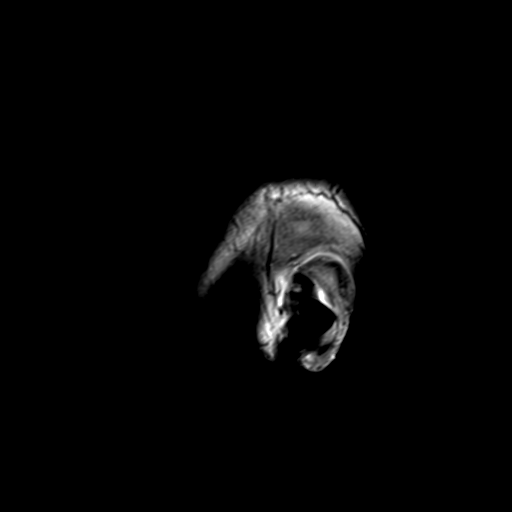

[Series 3: ep2d_diff_(id)_trace · axial · 3.0mm · 1.80mm/px · z∈[-59,+87]mm · 5 of 99 slices shown]
[im 1/99]
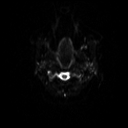
[im 25/99]
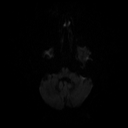
[im 50/99]
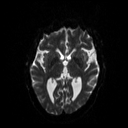
[im 74/99]
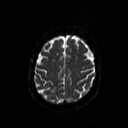
[im 99/99]
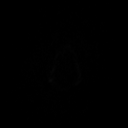

[Series 4: ep2d_diff_(id)_trace_adc · axial · 3.0mm · 1.80mm/px · z∈[-59,+87]mm · 3 of 50 slices shown]
[im 1/50]
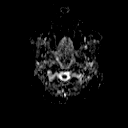
[im 25/50]
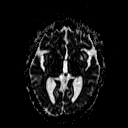
[im 50/50]
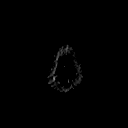

[Series 5: FLAIR · axial · 3.0mm · 0.43mm/px · 1 of 26 slices shown (1 of 2)]
[im 1/26]
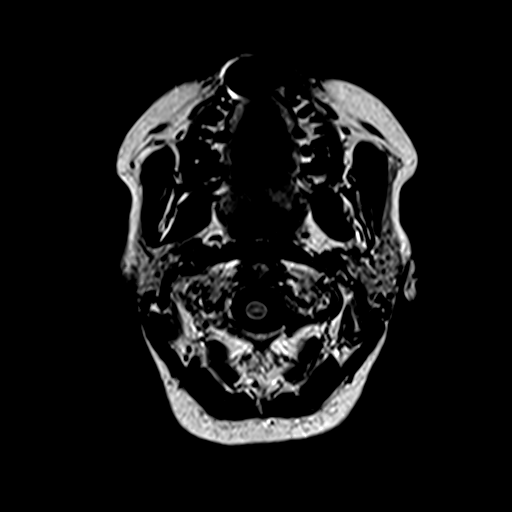

[Series 6: t2_tse_tra_512 · axial · 5.0mm · 0.60mm/px · 1 of 26 slices shown]
[im 1/26]
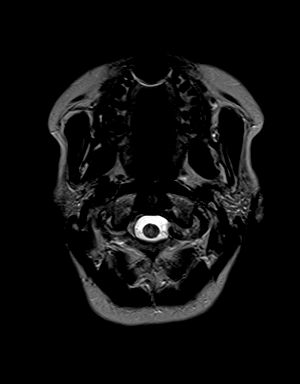

[Series 7: FLAIR · sagittal · 5.0mm · 0.45mm/px · 2 of 28 slices shown (2 of 2)]
[im 1/28]
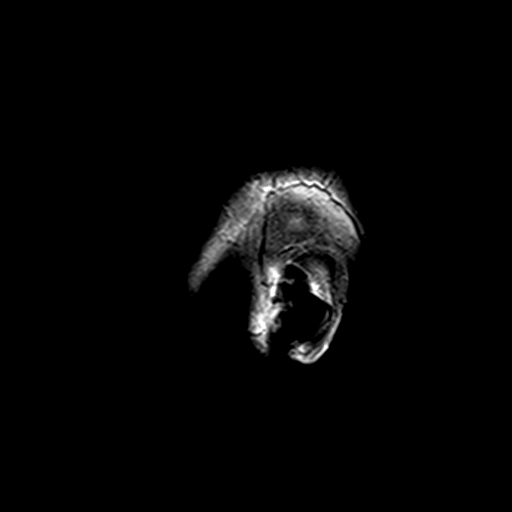
[im 28/28]
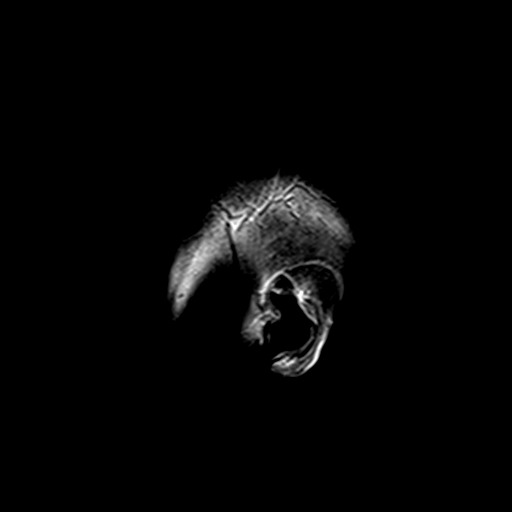

[Series 8: ep2d_diff_cor · coronal · 5.0mm · 1.77mm/px · 3 of 56 slices shown]
[im 1/56]
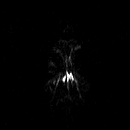
[im 28/56]
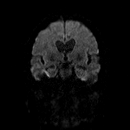
[im 56/56]
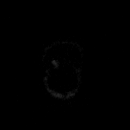

[Series 9: ep2d_diff_cor_adc · coronal · 5.0mm · 1.77mm/px · 2 of 28 slices shown]
[im 1/28]
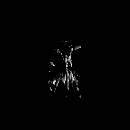
[im 28/28]
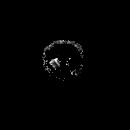

[Series 10: mip_images(sw) · axial · 16.0mm · 0.90mm/px · z∈[-58,+86]mm · 4 of 73 slices shown]
[im 1/73]
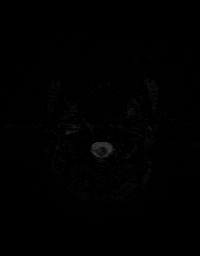
[im 25/73]
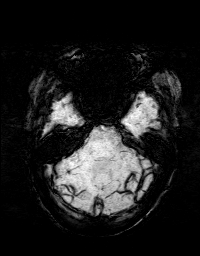
[im 49/73]
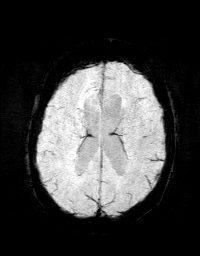
[im 73/73]
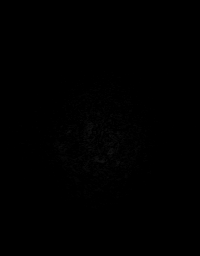

[Series 11: swi_images · axial · 2.0mm · 0.90mm/px · z∈[-65,+93]mm · 4 of 80 slices shown]
[im 1/80]
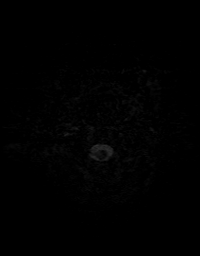
[im 27/80]
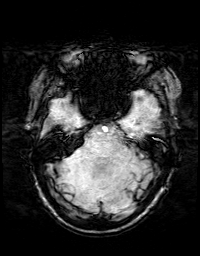
[im 53/80]
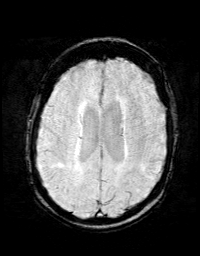
[im 80/80]
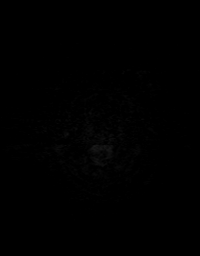

[Series 12: t1_mpr_tra · axial · 1.0mm · 0.72mm/px · z∈[-65,+94]mm · 9 of 160 slices shown]
[im 1/160]
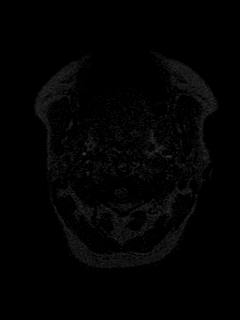
[im 20/160]
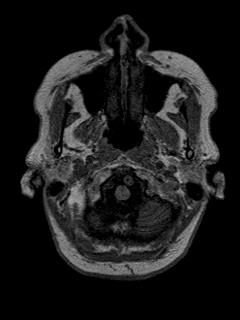
[im 40/160]
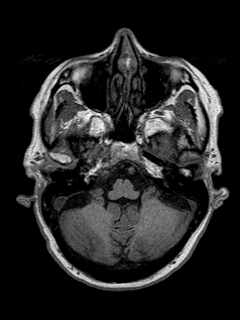
[im 60/160]
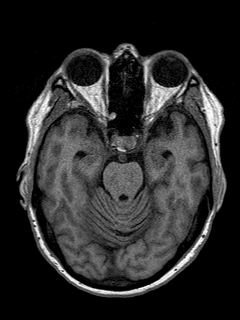
[im 80/160]
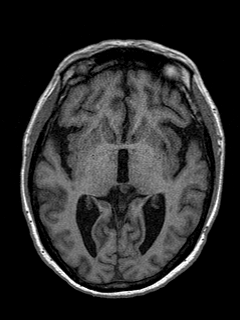
[im 100/160]
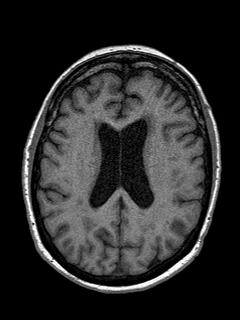
[im 120/160]
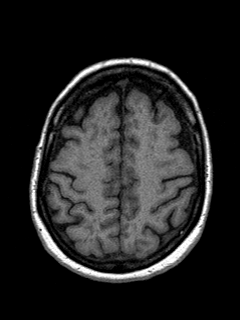
[im 140/160]
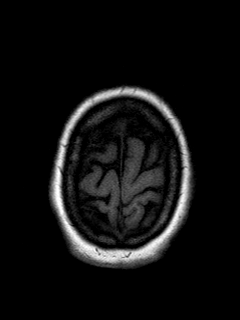
[im 160/160]
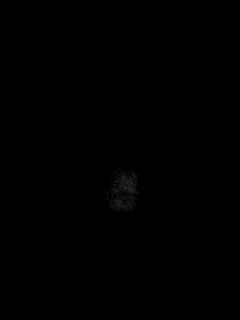

[Series 13: T2 · coronal · 5.0mm · 0.45mm/px · 2 of 29 slices shown]
[im 1/29]
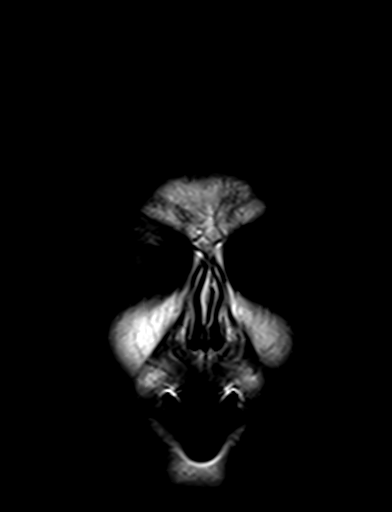
[im 29/29]
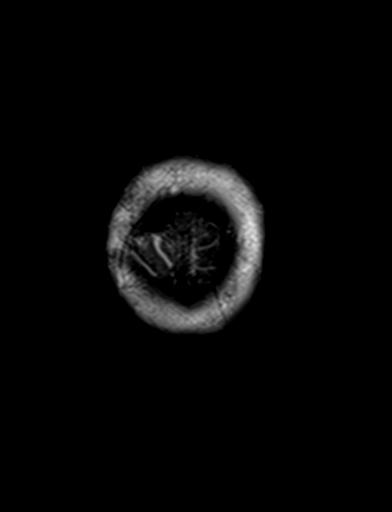

[Series 14: T1 post-contrast · coronal · 5.0mm · 0.72mm/px · 2 of 29 slices shown]
[im 1/29]
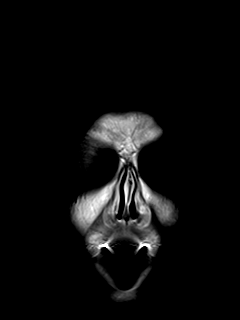
[im 29/29]
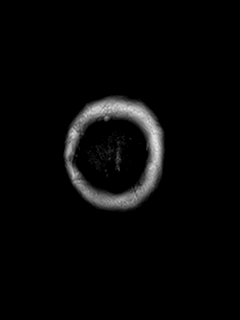

[Series 15: post t1_mpr_tra · axial · 1.0mm · 0.72mm/px · z∈[-65,+94]mm · 9 of 160 slices shown]
[im 1/160]
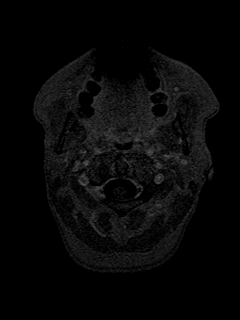
[im 20/160]
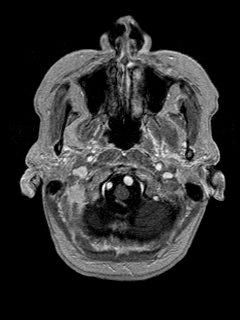
[im 40/160]
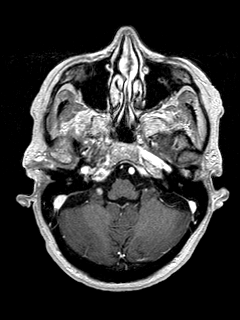
[im 60/160]
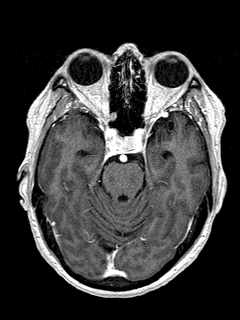
[im 80/160]
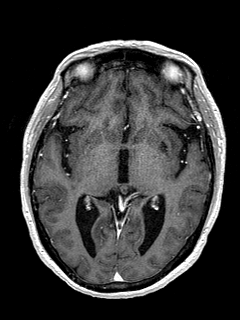
[im 100/160]
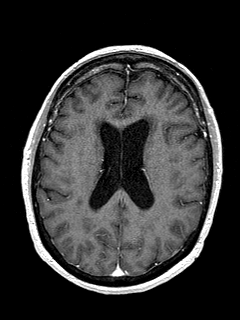
[im 120/160]
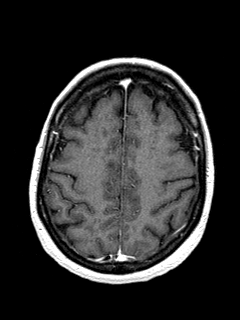
[im 140/160]
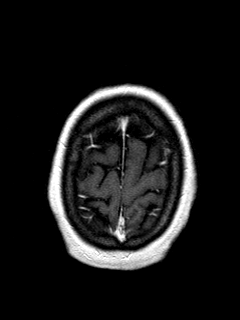
[im 160/160]
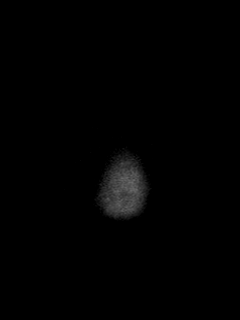

[48 of 48 positions shown; findings below may reference images not displayed]

FINDINGS: INTRACRANIAL CONTENTS: No reduced diffusion to suggest acute
ischemia or hyperacute demyelination. No susceptibility artifact to
suggest hemorrhage. Mild ventriculomegaly on the basis of global
parenchymal brain volume loss, increased from prior examination.
Patchy to confluent supratentorial pontine white matter FLAIR T2
hyperintensities in a nonspecific distribution, including splenium
of corpus callosum. Subcentimeter possible RIGHT cerebellar lesion.
Lesions do not demonstrate T1 hypointense signal seen with classic
demyelination. Bilateral inferior basal ganglia perivascular spaces.
No midline shift, mass effect or masses. No abnormal parenchymal or
extra-axial enhancement. No extra-axial fluid collections.

VASCULAR: Dolichoectatic intracranial vessels seen with chronic
hypertension. Ectatic LEFT vertebral artery at 5 mm.

SKULL AND UPPER CERVICAL SPINE: No abnormal sellar expansion. No
suspicious calvarial bone marrow signal. Craniocervical junction
maintained.

SINUSES/ORBITS: Small RIGHT ethmoid mucosal retention cyst. No
paranasal sinus air-fluid levels. Mastoid air cells are well
aerated. The included ocular globes and orbital contents are
non-suspicious.

OTHER: None.
IMPRESSION: 1. Moderate to severe white matter changes, progressed from prior
MRI. Findings are most typical of chronic small vessel ischemic
disease, superimposed chronic demyelination is possible. No acute
intracranial process or abnormal enhancement.
2. Mild parenchymal brain volume loss, progressed from prior
examination and advanced for age.

## 2018-04-17 ENCOUNTER — Ambulatory Visit (INDEPENDENT_AMBULATORY_CARE_PROVIDER_SITE_OTHER): Payer: Medicare Other | Admitting: Psychiatry

## 2018-04-17 DIAGNOSIS — F331 Major depressive disorder, recurrent, moderate: Secondary | ICD-10-CM | POA: Insufficient documentation

## 2018-04-17 NOTE — Progress Notes (Signed)
      [facility] Counselor/Therapist Progress Note   Patient ID: Angel Maddox, MRN: [id number]  Date: 04/17/2018  Timespent: 60 minutes  Treatment Type: Individual  Subjective:  Patient in today feeling worse with her MS.  Is seeing her doctor for MS and he is trying to get patient on newer meds but not approved yet by her insurance.  Patient concerned and tearful, not knowing if meds will be approved or not, and if the meds will help.  Is fearful as her MS seems to be worsening some.  Supportive counseling offered as patient talked about her current progression of MS and fears of the future.  Did encourage patient to try and stay in the present more versus looking into the future.  We continue to work on some grief issues surrounding the death of her son Angel Maddox three years ago. Patient  Is coping much better than when I first met patient last year.  Is able to balance out some of her sad feelings with some more upbeat or funny memories or Angel Maddox.  Enjoys it when she and husband are able to go to the elementary school that her grandkids attend and have lunch with them.  She and husband are to go on brief trip to mountains this coming week with friends--patient hoping she will be feeling good enough to enjoy it some.    Interventions:Solution Focused, Strength-based and Supportive  Mental Status Exam:   Appearance:   Casual     Behavior:  Appropriate and Sharing  Motor:  Slowed due to MS  Speech/Language:   Normal Rate  Affect:  Depressed, Flat and Tearful  Mood:  anxious, depressed and sad  Thought process:  Relevant and Intact  Thought content:    Logical  Perceptual disturbances:    Normal  Orientation:  Full (Time, Place, and Person)  Attention:  Good  Concentration:  good  Memory:  Immediate  Fund of knowledge:   Good  Insight:    Good  Judgment:   Good  Impulse Control:  good    Reported Symptoms: Fatigue, sad, anxious, depressed, frustrated, doesn't feel like meds for her MS  are helping, discouraged,   Risk Assessment: Danger to Self:  No Self-injurious Behavior: No Danger to Others: No Duty to Warn:no Physical Aggression / Violence:No  Access to Firearms a concern: No  Gang Involvement:No   Diagnosis:   ICD-10-CM   1. Major depressive disorder, recurrent episode, moderate ([place]) F33.1      Plan: Plan to see patient in 2-3 weeks to continue goal-directed treatment.  [name], LCSW

## 2018-05-01 ENCOUNTER — Ambulatory Visit (INDEPENDENT_AMBULATORY_CARE_PROVIDER_SITE_OTHER): Payer: Medicare Other | Admitting: Psychiatry

## 2018-05-01 DIAGNOSIS — F331 Major depressive disorder, recurrent, moderate: Secondary | ICD-10-CM | POA: Diagnosis not present

## 2018-05-01 NOTE — Progress Notes (Signed)
      [facility] Counselor/Therapist Progress Note   Patient ID: Angel Maddox, MRN: [id number]  Date: 05/01/2018  Timespent: 58 minutes   Treatment Type: Individual   Reported Symptoms: Fatigue and anxiety, depression, sad, stressed re: her illness and uncertainty   Mental Status Exam:    Appearance:   Casual     Behavior:  Appropriate, Sharing and tearful  Motor:  Normal  Speech/Language:   Normal Rate  Affect:  Blunt  Mood:  anxious and depressed  Thought process:  slowed  Thought content:    WNL  Sensory/Perceptual disturbances:    WNL  Orientation:  oriented to person, place, time/date, situation, day of week, month of year and year  Attention:  Good  Concentration:   "Hard for me to concentrate and remember names"  Memory:  "not too good, I seem to forget often", "husband has to help me remember my meds"  Fund of knowledge:   Good  Insight:    Fair  Judgment:   good  Impulse Control:  Good     Risk Assessment: Danger to Self:  No Self-injurious Behavior: No Danger to Others: No Duty to Warn:no Physical Aggression / Violence:No  Access to Firearms a concern: No  Gang Involvement:No    Subjective:  Patient in today with symptoms of depression, anxiety, sadness, and fatigue.  Some anxiety and fear regarding her MS and how it may progress.  Processed some of her fears of the unknown.  "I'm afraid my MS may start to affect my organs and other things in my body and I'll be even worse."  She reports she "is to see her MS doctor, within a couple weeks,"  and plans to ask him about some of her fears related to [place]. Has noticed more difficulty breathing when she is up and moving about, and then after sitting a few minutes her breathing gets better---this was observed in office here today.  Patient able to see some things that brings her joy even with her limitations--visiting her grandchildren, hearing from her daughter, visiting friends as able.  Talked about  strategies to use that can help her reframe some of her thoughts when she is feeling more tense and fearful.   Interventions: Solution-Oriented/Positive Psychology, Ego-Supportive and Grief Therapy   Diagnosis:   ICD-10-CM   1. Major depressive disorder, recurrent episode, moderate ([place]) F33.1      Plan:   Plan to see patient again in approx 2 weeks for continued goal-directed treatment.   [name], LCSW

## 2018-05-04 ENCOUNTER — Ambulatory Visit (HOSPITAL_COMMUNITY)
Admission: RE | Admit: 2018-05-04 | Discharge: 2018-05-04 | Disposition: A | Discharge status: Home / Self Care | Payer: Medicare Other | Source: Ambulatory Visit | Attending: Psychiatry | Admitting: Psychiatry

## 2018-05-04 ENCOUNTER — Encounter (HOSPITAL_COMMUNITY): Payer: Self-pay

## 2018-05-04 DIAGNOSIS — G35 Multiple sclerosis: Secondary | ICD-10-CM | POA: Insufficient documentation

## 2018-05-04 MED ORDER — SODIUM CHLORIDE 0.9 % IV SOLN
INTRAVENOUS | Status: DC | PRN
  Administered 2018-05-04: 08:00:00 via INTRAVENOUS

## 2018-05-04 MED ORDER — RITUXIMAB CHEMO INJECTION 500 MG/50ML
1000.0000 mg | INTRAVENOUS | Status: DC
  Administered 2018-05-04: 1000 mg via INTRAVENOUS
  Filled 2018-05-04: qty 100

## 2018-05-04 MED ORDER — DIPHENHYDRAMINE HCL 50 MG/ML IJ SOLN
25.0000 mg | INTRAMUSCULAR | Status: DC | PRN
  Administered 2018-05-04: 25 mg via INTRAVENOUS
  Filled 2018-05-04: qty 1

## 2018-05-04 MED ORDER — METHYLPREDNISOLONE SODIUM SUCC 125 MG IJ SOLR
125.0000 mg | INTRAMUSCULAR | Status: DC | PRN
  Administered 2018-05-04: 125 mg via INTRAVENOUS
  Filled 2018-05-04: qty 2

## 2018-05-04 MED ORDER — ACETAMINOPHEN 500 MG PO TABS
1000.0000 mg | ORAL_TABLET | ORAL | Status: DC | PRN
  Administered 2018-05-04: 1000 mg via ORAL
  Filled 2018-05-04: qty 2

## 2018-05-04 NOTE — Discharge Instructions (Signed)

## 2018-05-04 NOTE — Progress Notes (Signed)
Pt tolerated first Rituxan infusion well.  Info on Rituxan given to pt. Along with her next appointment for Nov.22 and March appointments.  Reminded pt we do not call and remind you of your appointments.  Pt and her husband voiced understanding.

## 2018-05-18 ENCOUNTER — Ambulatory Visit (HOSPITAL_COMMUNITY)
Admission: RE | Admit: 2018-05-18 | Discharge: 2018-05-18 | Disposition: A | Discharge status: Home / Self Care | Payer: Medicare Other | Source: Ambulatory Visit | Attending: Psychiatry | Admitting: Psychiatry

## 2018-05-18 ENCOUNTER — Encounter (HOSPITAL_COMMUNITY): Payer: Self-pay

## 2018-05-18 DIAGNOSIS — G35 Multiple sclerosis: Secondary | ICD-10-CM | POA: Insufficient documentation

## 2018-05-18 MED ORDER — ACETAMINOPHEN 500 MG PO TABS
1000.0000 mg | ORAL_TABLET | ORAL | Status: AC | PRN
  Administered 2018-05-18: 1000 mg via ORAL
  Filled 2018-05-18: qty 2

## 2018-05-18 MED ORDER — SODIUM CHLORIDE 0.9 % IV SOLN
1000.0000 mg | INTRAVENOUS | Status: AC
  Administered 2018-05-18: 1000 mg via INTRAVENOUS
  Filled 2018-05-18: qty 100

## 2018-05-18 MED ORDER — SODIUM CHLORIDE 0.9 % IV SOLN
INTRAVENOUS | Status: AC | PRN
  Administered 2018-05-18: 250 mL via INTRAVENOUS

## 2018-05-18 MED ORDER — METHYLPREDNISOLONE SODIUM SUCC 125 MG IJ SOLR
125.0000 mg | INTRAMUSCULAR | Status: AC | PRN
  Administered 2018-05-18: 125 mg via INTRAVENOUS
  Filled 2018-05-18: qty 2

## 2018-05-18 MED ORDER — DIPHENHYDRAMINE HCL 50 MG/ML IJ SOLN
25.0000 mg | INTRAMUSCULAR | Status: AC | PRN
  Administered 2018-05-18: 10:00:00 via INTRAVENOUS
  Filled 2018-05-18: qty 1

## 2018-05-21 ENCOUNTER — Ambulatory Visit: Payer: Medicare Other | Admitting: Psychiatry

## 2018-05-29 ENCOUNTER — Ambulatory Visit (INDEPENDENT_AMBULATORY_CARE_PROVIDER_SITE_OTHER): Payer: Medicare Other | Admitting: Psychiatry

## 2018-05-29 DIAGNOSIS — F331 Major depressive disorder, recurrent, moderate: Secondary | ICD-10-CM

## 2018-05-29 NOTE — Progress Notes (Signed)
      [facility] Counselor/Therapist Progress Note  Patient ID: Angel Maddox, MRN: [id number],    Date: 05/29/2018  Time Spent:  60 minutes   Treatment Type: Individual Therapy  Reported Symptoms: Depressed mood, Irritability, Fatigue and Sleep disturbance  Mental Status Exam:  Appearance:   Casual     Behavior:  Appropriate and Sharing  Motor:  Normal  Speech/Language:   Normal Rate  Affect:  Flat  Mood:  depressed and irritable  Thought process:  normal and slowed  Thought content:    WNL  Sensory/Perceptual disturbances:    WNL  Orientation:  oriented to person, place, time/date, situation, day of week, month of year and year  Attention:  Poor  Concentration:  Poor  Memory:  Poor recent and immediate  Fund of knowledge:   Fair  Insight:    Poor  Judgment:   Fair  Impulse Control:  Poor   Risk Assessment: Danger to Self:  No Self-injurious Behavior: No Danger to Others: No Duty to Warn:no Physical Aggression / Violence:No  Access to Firearms a concern: No  Gang Involvement:No   Subjective:   Patient more groggy than usual today and in talking with her, and then later with her husband who was in lobby, it is felt that he is needing to more closely help her with her meds as husband said she may be forgetting when she takes certain meds and may end up over-medicating.  Husband stated he was going to be in touch with her medical doctor who prescribes that particular medication.  He reports there has been some over-usage of meds in past years in other situations so wants to make sure this medication is well monitored.  Patient did share some of her sadness experienced over the holidays, especially around her missing her deceased son.  States the pain is understandably worse for her at holidays.  Did seem to have a little more energy by end of session.  Is scheduled to return 2-3 wks.  Interventions: Solution-Oriented/Positive Psychology and Ego-Supportive  Diagnosis:  ICD-10-CM   1. Major depressive disorder, recurrent episode, moderate ([facility]) F33.1     Plan:  Patient and husband are to follow up with one of her Dr's in reference to a medication she is on and how to best monitor it.  Will continue goal-directed therapy with patient upon her return in 2-3 wks.  [name], LCSW

## 2018-06-05 ENCOUNTER — Telehealth: Payer: Self-pay | Admitting: *Deleted

## 2018-06-05 ENCOUNTER — Ambulatory Visit (INDEPENDENT_AMBULATORY_CARE_PROVIDER_SITE_OTHER): Payer: Medicare Other | Admitting: Family

## 2018-06-05 ENCOUNTER — Encounter: Payer: Self-pay | Admitting: Family

## 2018-06-05 ENCOUNTER — Other Ambulatory Visit: Payer: Self-pay | Admitting: Family

## 2018-06-05 ENCOUNTER — Ambulatory Visit (HOSPITAL_COMMUNITY)
Admission: RE | Admit: 2018-06-05 | Discharge: 2018-06-05 | Disposition: A | Discharge status: Home / Self Care | Payer: Medicare Other | Source: Ambulatory Visit | Attending: Family | Admitting: Family

## 2018-06-05 VITALS — BP 128/81 | HR 77 | Temp 97.1°F

## 2018-06-05 DIAGNOSIS — R0602 Shortness of breath: Secondary | ICD-10-CM | POA: Diagnosis not present

## 2018-06-05 DIAGNOSIS — R079 Chest pain, unspecified: Secondary | ICD-10-CM | POA: Diagnosis not present

## 2018-06-05 DIAGNOSIS — F419 Anxiety disorder, unspecified: Secondary | ICD-10-CM | POA: Diagnosis not present

## 2018-06-05 LAB — POCT I-STAT CREATININE: CREATININE: 0.9 mg/dL (ref 0.44–1.00)

## 2018-06-05 MED ORDER — IOPAMIDOL (ISOVUE-370) INJECTION 76%
100.0000 mL | Freq: Once | INTRAVENOUS | Status: AC | PRN
  Administered 2018-06-05: 100 mL via INTRAVENOUS

## 2018-06-05 NOTE — Patient Instructions (Signed)
Shortness of Breath, Adult  Shortness of breath means you have trouble breathing. Your lungs are organs for breathing.  Follow these instructions at home:  Pay attention to any changes in your symptoms. Take these actions to help with your condition:  ? Do not smoke. Smoking can cause shortness of breath. If you need help to quit smoking, ask your doctor.  ? Avoid things that can make it harder to breathe, such as:  ? Mold.  ? Dust.  ? Air pollution.  ? Chemical smells.  ? Things that can cause allergy symptoms (allergens), if you have allergies.  ? Keep your living space clean and free of mold and dust.  ? Rest as needed. Slowly return to your usual activities.  ? Take over-the-counter and prescription medicines, including oxygen and inhaled medicines, only as told by your doctor.  ? Keep all follow-up visits as told by your doctor. This is important.  Contact a doctor if:  ? Your condition does not get better as soon as expected.  ? You have a hard time doing your normal activities, even after you rest.  ? You have new symptoms.  Get help right away if:  ? You have trouble breathing when you are resting.  ? You feel light-headed or you faint.  ? You have a cough that is not helped by medicines.  ? You cough up blood.  ? You have pain with breathing.  ? You have pain in your chest, arms, shoulders, or belly (abdomen).  ? You have a fever.  ? You cannot walk up stairs.  ? You cannot exercise the way you normally do.  This information is not intended to replace advice given to you by your health care provider. Make sure you discuss any questions you have with your health care provider.  Document Released: 11/30/2007 Document Revised: 06/30/2016 Document Reviewed: 06/30/2016  [employer]vier Interactive Patient Education ? 2017 [employer]

## 2018-06-05 NOTE — Progress Notes (Signed)
   Subjective:    Patient ID: Angel Maddox, female    DOB: 1956-05-22, 62 y.o.   MRN: [id number]  Chief Complaint  Patient presents with  . Shortness of Breath   PT presents to the office today with SOB and not "being able to catch my breathe". She states she has SOB over the last several months, but since yesterday it has worsen.   She has anxiety, but has never had a panic attack like this. She takes Klonopin, but only at night. She has MS and is taking Ocrelizumab.   No hx of DVT or PE.  Shortness of Breath  This is a new problem. The current episode started 1 to 4 weeks ago. The problem occurs constantly. The problem has been rapidly worsening. Associated symptoms include chest pain. Pertinent negatives include no orthopnea. The symptoms are aggravated by emotional upset.      Review of Systems  Respiratory: Positive for shortness of breath.   Cardiovascular: Positive for chest pain. Negative for orthopnea.  Neurological: Positive for weakness.  Psychiatric/Behavioral: The patient is nervous/anxious.   All other systems reviewed and are negative.      Objective:   Physical Exam  Constitutional: She is oriented to person, place, and time. She appears well-developed and well-nourished. She appears distressed.  HENT:  Head: Normocephalic and atraumatic.  Right Ear: External ear normal.  Mouth/Throat: Oropharynx is clear and moist.  Eyes: Pupils are equal, round, and reactive to light.  Neck: Normal range of motion. Neck supple. No thyromegaly present.  Cardiovascular: Normal rate, regular rhythm, normal heart sounds and intact distal pulses.  No murmur heard. Pulmonary/Chest: Breath sounds normal. Accessory muscle usage present. Tachypnea noted. No respiratory distress. She has no wheezes.  Abdominal: Soft. Bowel sounds are normal. She exhibits no distension. There is no tenderness.  Musculoskeletal: She exhibits no edema or tenderness.  Generalized weakness, has MS. Pt  in wheelchair  Neurological: She is alert and oriented to person, place, and time. She has normal reflexes. No cranial nerve deficit.  Skin: Skin is warm and dry.  Psychiatric: Her behavior is normal. Judgment and thought content normal. Her mood appears anxious.  Vitals reviewed.     BP 128/81   Pulse 77   Temp (!) 97.1 F (36.2 C) (Oral)   SpO2 99%      Assessment & Plan:  [name] comes in today with chief complaint of Shortness of Breath   Diagnosis and orders addressed:  1. Chest pain, unspecified type - EKG 12-Lead  2. Shortness of breath  3. Anxiety  Stat CT Angio Chest ordered to rule out PE Vital signs stable, oxygen 99%, no tachycardia.  Unsure if this is a PE or panic attack. If PE negative she will need lab work to rule out other causes. She has Klonopin at home.   Evelina Dun, FNP

## 2018-06-05 NOTE — Telephone Encounter (Signed)
Pt c/o increased SOB and feeling like she had something "in her neck" so pt scheduled this morning with Evelina Dun for evaluation.

## 2018-06-11 ENCOUNTER — Telehealth: Payer: Self-pay | Admitting: Physician Assistant

## 2018-06-11 NOTE — Telephone Encounter (Signed)
Do you have anywhere that we can squeeze her in to see you. You are completely booked the rest of the week.

## 2018-06-11 NOTE — Telephone Encounter (Signed)
Appointment given for 7:30am in the morning.

## 2018-06-12 ENCOUNTER — Ambulatory Visit: Payer: Medicare Other | Admitting: Physician Assistant

## 2018-06-12 ENCOUNTER — Encounter: Payer: Self-pay | Admitting: Physician Assistant

## 2018-06-12 VITALS — BP 143/95 | HR 76 | Temp 97.5°F | Ht 67.0 in | Wt 137.8 lb

## 2018-06-12 DIAGNOSIS — J209 Acute bronchitis, unspecified: Secondary | ICD-10-CM

## 2018-06-12 MED ORDER — AZITHROMYCIN 250 MG PO TABS: ORAL_TABLET | ORAL | 0 refills | Status: DC

## 2018-06-12 MED ORDER — ALBUTEROL SULFATE HFA 108 (90 BASE) MCG/ACT IN AERS: 2.0000 | INHALATION_SPRAY | RESPIRATORY_TRACT | 1 refills | Status: DC | PRN

## 2018-06-12 MED ORDER — METHYLPREDNISOLONE ACETATE 80 MG/ML IJ SUSP
80.0000 mg | Freq: Once | INTRAMUSCULAR | Status: AC
  Administered 2018-06-12: 80 mg via INTRAMUSCULAR

## 2018-06-13 ENCOUNTER — Encounter: Payer: Self-pay | Admitting: Psychiatry

## 2018-06-13 ENCOUNTER — Ambulatory Visit (INDEPENDENT_AMBULATORY_CARE_PROVIDER_SITE_OTHER): Payer: Medicare Other | Admitting: Psychiatry

## 2018-06-13 ENCOUNTER — Ambulatory Visit: Payer: Medicare Other | Admitting: Physician Assistant

## 2018-06-13 VITALS — BP 141/85 | HR 63

## 2018-06-13 DIAGNOSIS — F331 Major depressive disorder, recurrent, moderate: Secondary | ICD-10-CM

## 2018-06-13 MED ORDER — BUPROPION HCL ER (XL) 150 MG PO TB24: ORAL_TABLET | ORAL | 2 refills | Status: DC

## 2018-06-13 NOTE — Progress Notes (Signed)
MILDRETH REEK [id number] 01/02/1956 62 y.o.  Subjective:   Patient ID:  Angel Maddox is a 62 y.o. (DOB 03-10-1956) female.  Chief Complaint:  Chief Complaint  Patient presents with  . Depression    HPI Angel Maddox presents to the office emergently today for follow-up of depression and is followed by Donnal Moat, PA. She is accompanied by her husband. Husband reports that she has had a significant progression of MS s/s and that medical provider recommended re-evaluation for depression since depressive s/s have significantly worsened in the last 6-8 months. Reports that she initially sought tx for depression after the death of her son. Denies significant anxiety. Mood has been persistently sad and she has been frequently tearful. Energy has been very low. Motivation has been low. Reports that she loves seeing her grandchildren and otherwise reports decreased interest and enjoyment in things. Husband reports that she has irregular sleep cycle. Husband reports "all she wants to do is sleep." Husband reports "she got pretty heavy" on oxycodone and muscle relaxants and husband is now managing this for her. They deny any withdrawal s/s. Eating 2 meals daily. They deny any significant change in appetite. She reports that her concentration has been "terrible." Reports that she had a period of passive death wishes. Denies SI.   Past Psychiatric Medication Trials: Risperdal- unsure of response. Took higher doses in the past.  Olanzapine Wellbutrin XL- has been effective Celexa- Reports that this has been effective and had worsening depression with dose reduction.  Prozac Ritalin- caused increased increased anxiety Modafanil- did not improve energy. Caused increased anxiety.  Lamictal Ambien Vistaril   Review of Systems:  Review of Systems  Respiratory: Positive for shortness of breath.        Has been referred to a pulmonologist.  Musculoskeletal: Positive for gait problem.   Neurological: Positive for weakness. Negative for tremors.  Psychiatric/Behavioral:       Please refer to HPI    Medications: I have reviewed the patient's current medications.  Current Outpatient Medications  Medication Sig Dispense Refill  . albuterol (PROVENTIL HFA;VENTOLIN HFA) 108 (90 Base) MCG/ACT inhaler Inhale 2 puffs into the lungs every 4 (four) hours as needed for wheezing or shortness of breath. 1 Inhaler 1  . Ascorbic Acid (VITAMIN C) 1000 MG tablet Take 1,000 mg by mouth daily.    Marland Kitchen azithromycin (ZITHROMAX) 250 MG tablet Take as directed 6 tablet 0  . buPROPion (WELLBUTRIN XL) 300 MG 24 hr tablet TAKE 1 TABLET EVERY DAY 30 tablet 0  . calcium carbonate (OSCAL) 1500 (600 Ca) MG TABS tablet Take 1,500 mg by mouth daily with breakfast.    . citalopram (CELEXA) 40 MG tablet TAKE 1 TABLET (40 MG TOTAL) BY MOUTH DAILY WITH BREAKFAST.  3  . clonazePAM (KLONOPIN) 0.5 MG tablet Take 0.25 mg by mouth at bedtime. Takes half a tablet    . gabapentin (NEURONTIN) 800 MG tablet Take 400 mg by mouth 3 (three) times daily. Takes half tablet    . hydrochlorothiazide (HYDRODIURIL) 25 MG tablet Take 0.5 tablets (12.5 mg total) by mouth daily. 90 tablet 3  . ibuprofen (ADVIL,MOTRIN) 200 MG tablet Take 800 mg by mouth every 8 (eight) hours as needed for moderate pain.    . modafinil (PROVIGIL) 200 MG tablet Take 200 mg by mouth 2 (two) times daily.  5  . Multiple Vitamins-Minerals (WOMENS DAILY FORMULA PO) Take 1 tablet by mouth every evening.     . Oxycodone HCl 10  MG TABS Take 1 tablet (10 mg total) by mouth every 6 (six) hours as needed ((score 7 to 10)). 120 tablet 0  . Oxycodone HCl 10 MG TABS Take 1 tablet (10 mg total) by mouth 4 (four) times daily. 120 tablet 0  . Oxycodone HCl 10 MG TABS Take 1 tablet (10 mg total) by mouth 4 (four) times daily. 120 tablet 0  . primidone (MYSOLINE) 250 MG tablet Take 1 tablet (250 mg total) by mouth every 8 (eight) hours. 90 tablet 0  . risperiDONE  (RISPERDAL) 1 MG tablet Take 1 tablet (1 mg total) by mouth at bedtime. 90 tablet 1  . tizanidine (ZANAFLEX) 2 MG capsule Take 2 mg by mouth daily.     . traZODone (DESYREL) 50 MG tablet Take 1 tablet (50 mg total) by mouth at bedtime and may repeat dose one time if needed. (Patient taking differently: Take 100 mg by mouth at bedtime. ) 30 tablet 0  . valACYclovir (VALTREX) 1000 MG tablet TAKE 1 TABLET 2 TIMES DAILY. THEN STAY ON ONE TABLET DAILY AS PREVENTION (Patient taking differently: daily. ) 40 tablet 5  . buPROPion (WELLBUTRIN XL) 150 MG 24 hr tablet Take 1 tab po q am with a 300 mg tablet to equal total dose of 450 mg 30 tablet 2  . meloxicam (MOBIC) 15 MG tablet Take 15 mg by mouth daily.    . methylphenidate (RITALIN) 20 MG tablet     . Ocrelizumab (OCREVUS IV) Inject 600 mg into the vein every 6 (six) months. Then 600 mg every 6 months. Next infusion due 09/2017    . omeprazole (PRILOSEC) 20 MG capsule Take 1 capsule (20 mg total) by mouth daily. (Patient not taking: Reported on 06/13/2018) 30 capsule 11  . risedronate (ACTONEL) 150 MG tablet TAKE 1 TAB EVERY THIRTY DAYS WITH WATER ON AN EMPTY STOMACH, NOTHING BY MOUTH OR LIE DOWN FOR 30MINS  3   No current facility-administered medications for this visit.     Medication Side Effects: None  Allergies: No Known Allergies  Past Medical History:  Diagnosis Date  . Anxiety   . Arthritis    osteo  . Bipolar 1 disorder ([place])   . Cervicalgia   . Chronic kidney disease    kidney stone  . Depression   . Elevated liver enzymes    She says "normal now"  . GERD (gastroesophageal reflux disease)   . H/O hiatal hernia   . Headache   . Hip fracture ([place]) 2017   left  . History of kidney stones    4-5 yrs ago  . Hypertension   . Multiple sclerosis ([place])     Had for 15 years  . Multiple sclerosis ([place])    dx 1999  . Tremors of nervous system     Family History  Problem Relation Age of Onset  . Heart attack Mother   . Cancer  Maternal Aunt   . Colon cancer Neg Hx   . Stomach cancer Neg Hx     Social History   Socioeconomic History  . Marital status: Married    Spouse name: Not on file  . Number of children: Not on file  . Years of education: Not on file  . Highest education level: Not on file  Occupational History  . Not on file  Social Needs  . Financial resource strain: Not on file  . Food insecurity:    Worry: Not on file    Inability: Not on  file  . Transportation needs:    Medical: Not on file    Non-medical: Not on file  Tobacco Use  . Smoking status: Former Smoker    Types: Cigarettes    Last attempt to quit: 01/25/1991    Years since quitting: 27.4  . Smokeless tobacco: Never Used  Substance and Sexual Activity  . Alcohol use: No  . Drug use: No  . Sexual activity: Yes  Lifestyle  . Physical activity:    Days per week: Not on file    Minutes per session: Not on file  . Stress: Not on file  Relationships  . Social connections:    Talks on phone: Not on file    Gets together: Not on file    Attends religious service: Not on file    Active member of club or organization: Not on file    Attends meetings of clubs or organizations: Not on file    Relationship status: Not on file  . Intimate partner violence:    Fear of current or ex partner: Not on file    Emotionally abused: Not on file    Physically abused: Not on file    Forced sexual activity: Not on file  Other Topics Concern  . Not on file  Social History Narrative  . Not on file    Past Medical History, Surgical history, Social history, and Family history were reviewed and updated as appropriate.   Please see review of systems for further details on the patient's review from today.   Objective:   Physical Exam:  BP (!) 141/85   Pulse 63   Physical Exam Constitutional:      General: She is not in acute distress.    Appearance: She is well-developed.  Musculoskeletal:     Comments: Ambulates with Rollator   Neurological:     Mental Status: She is alert and oriented to person, place, and time.     Coordination: Coordination normal.  Psychiatric:        Mood and Affect: Mood is depressed. Mood is not anxious. Affect is blunt. Affect is not labile, angry or inappropriate.        Speech: Speech normal.        Behavior: Behavior normal.        Thought Content: Thought content normal. Thought content does not include homicidal or suicidal ideation. Thought content does not include homicidal or suicidal plan.        Judgment: Judgment normal.     Comments: Insight intact. No auditory or visual hallucinations. No delusions.      Lab Review:     Component Value Date/Time   NA 126 (L) 07/24/2017 1442   NA 130 (L) 05/01/2017 0836   K 3.8 07/24/2017 1442   CL 89 (L) 07/24/2017 1442   CO2 26 07/24/2017 1442   GLUCOSE 82 07/24/2017 1442   BUN 9 07/24/2017 1442   BUN 9 05/01/2017 0836   CREATININE 0.90 06/05/2018 1238   CALCIUM 9.2 07/24/2017 1442   PROT 6.6 05/01/2017 0836   ALBUMIN 4.3 05/01/2017 0836   AST 51 (H) 05/01/2017 0836   ALT 58 (H) 05/01/2017 0836   ALKPHOS 93 05/01/2017 0836   BILITOT 0.3 05/01/2017 0836   GFRNONAA >60 07/24/2017 1442   GFRAA >60 07/24/2017 1442       Component Value Date/Time   WBC 4.2 07/24/2017 1442   RBC 3.90 07/24/2017 1442   HGB 13.6 07/24/2017 1442   HGB 12.3 02/01/2017  0935   HCT 38.7 07/24/2017 1442   HCT 36.5 02/01/2017 0935   PLT 275 07/24/2017 1442   PLT 301 02/01/2017 0935   MCV 99.2 07/24/2017 1442   MCV 97 02/01/2017 0935   MCH 34.9 (H) 07/24/2017 1442   MCHC 35.1 07/24/2017 1442   RDW 11.8 07/24/2017 1442   RDW 15.8 (H) 02/01/2017 0935   LYMPHSABS 2.0 07/24/2017 1442   LYMPHSABS 1.5 02/01/2017 0935   MONOABS 0.5 07/24/2017 1442   EOSABS 0.1 07/24/2017 1442   EOSABS 0.1 02/01/2017 0935   BASOSABS 0.0 07/24/2017 1442   BASOSABS 0.0 02/01/2017 0935    No results found for: POCLITH, LITHIUM   No results found for: PHENYTOIN,  PHENOBARB, VALPROATE, CBMZ   .res Assessment: Plan:   Patient seen for 30 minutes and greater than 50% of visit spent counseling patient regarding treatment options for depressive signs and symptoms, to include increasing Wellbutrin XL to 450 mg or considering changing Risperdal to a different atypical antipsychotic that may be more effective for her depression.  Agreed with plan to increase Wellbutrin XL to 450 mg to improve depressed mood, low energy, low motivation, and drowsiness.  Advised patient to contact office with any worsening anxiety or irritability.  Recommend following up with Donnal Moat, PA in 1 month. Major depressive disorder, recurrent episode, moderate ([place]) - Plan: buPROPion (WELLBUTRIN XL) 150 MG 24 hr tablet  Please see After Visit Summary for patient specific instructions.  Future Appointments  Date Time Provider [place]  07/03/2018  9:30 AM Tanda Rockers, MD [facility] None  07/06/2018  9:00 AM [name], LCSW [facility] None  07/10/2018 10:10 AM Terald Sleeper, PA-C [facility] None  07/17/2018  2:00 PM Donnal Moat T, PA-C [facility] None  07/31/2018 10:00 AM Donnal Moat T, PA-C [facility] None  09/04/2018  8:00 AM [facility] RO[facility] None  09/18/2018  8:00 AM [facility] ROOM [facility] None    No orders of the defined types were placed in this encounter.     -------------------------------

## 2018-06-17 ENCOUNTER — Other Ambulatory Visit: Payer: Self-pay | Admitting: Physician Assistant

## 2018-06-17 DIAGNOSIS — E278 Other specified disorders of adrenal gland: Secondary | ICD-10-CM

## 2018-06-17 NOTE — Progress Notes (Signed)
BP (!) 143/95   Pulse 76   Temp (!) 97.5 F (36.4 C) (Oral)   Ht 5\' 7"  (1.702 m)   Wt 137 lb 12.8 oz (62.5 kg)   BMI 21.58 kg/m    Subjective:    Patient ID: Angel Maddox, female    DOB: [birth year], 62 y.o.   MRN: [id number]  HPI: Angel Maddox is a 62 y.o. female presenting on 06/12/2018 for Shortness of Breath and discuss CT scan  Patient with several days of progressing upper respiratory and bronchial symptoms. Initially there was more upper respiratory congestion. This progressed to having significant cough that is productive throughout the day and severe at night. There is occasional wheezing after coughing. Sometimes there is slight dyspnea on exertion. It is productive mucus that is yellow in color. Denies any blood.   Past Medical History:  Diagnosis Date  . Anxiety   . Arthritis    osteo  . Bipolar 1 disorder ([place])   . Cervicalgia   . Chronic kidney disease    kidney stone  . Depression   . Elevated liver enzymes    She says "normal now"  . GERD (gastroesophageal reflux disease)   . H/O hiatal hernia   . Headache   . Hip fracture ([place]) 2017   left  . History of kidney stones    4-5 yrs ago  . Hypertension   . Multiple sclerosis ([place])     Had for 15 years  . Multiple sclerosis ([place])    dx 1999  . Tremors of nervous system    Relevant past medical, surgical, family and social history reviewed and updated as indicated. Interim medical history since our last visit reviewed. Allergies and medications reviewed and updated. DATA REVIEWED: CHART IN EPIC  Family History reviewed for pertinent findings.  Review of Systems  Constitutional: Positive for chills and fatigue. Negative for activity change and appetite change.  HENT: Positive for congestion, postnasal drip and sore throat.   Eyes: Negative.   Respiratory: Positive for cough, shortness of breath and wheezing.   Cardiovascular: Negative.  Negative for chest pain, palpitations and leg swelling.    Gastrointestinal: Negative.   Genitourinary: Negative.   Musculoskeletal: Negative.   Skin: Negative.   Neurological: Positive for headaches.    Allergies as of 06/12/2018   No Known Allergies     Medication List       Accurate as of June 12, 2018 11:59 PM. Always use your most recent med list.        albuterol 108 (90 Base) MCG/ACT inhaler Commonly known as:  PROVENTIL HFA;VENTOLIN HFA Inhale 2 puffs into the lungs every 4 (four) hours as needed for wheezing or shortness of breath.   azithromycin 250 MG tablet Commonly known as:  ZITHROMAX Take as directed   buPROPion 300 MG 24 hr tablet Commonly known as:  WELLBUTRIN XL TAKE 1 TABLET EVERY DAY   calcium carbonate 1500 (600 Ca) MG Tabs tablet Commonly known as:  OSCAL Take 1,500 mg by mouth daily with breakfast.   citalopram 40 MG tablet Commonly known as:  CELEXA TAKE 1 TABLET (40 MG TOTAL) BY MOUTH DAILY WITH BREAKFAST.   clonazePAM 0.5 MG tablet Commonly known as:  KLONOPIN Take 0.25 mg by mouth at bedtime. Takes half a tablet   gabapentin 800 MG tablet Commonly known as:  NEURONTIN Take 400 mg by mouth 3 (three) times daily. Takes half tablet   hydrochlorothiazide 25 MG tablet Commonly known as:  HYDRODIURIL Take 0.5 tablets (12.5 mg total) by mouth daily.   ibuprofen 200 MG tablet Commonly known as:  ADVIL,MOTRIN Take 800 mg by mouth every 8 (eight) hours as needed for moderate pain.   meloxicam 15 MG tablet Commonly known as:  MOBIC Take 15 mg by mouth daily.   methylphenidate 20 MG tablet Commonly known as:  RITALIN   modafinil 200 MG tablet Commonly known as:  PROVIGIL Take 200 mg by mouth 2 (two) times daily.   OCREVUS IV Inject 600 mg into the vein every 6 (six) months. Then 600 mg every 6 months. Next infusion due 09/2017   omeprazole 20 MG capsule Commonly known as:  PRILOSEC Take 1 capsule (20 mg total) by mouth daily.   Oxycodone HCl 10 MG Tabs Take 1 tablet (10 mg total) by  mouth every 6 (six) hours as needed ((score 7 to 10)).   Oxycodone HCl 10 MG Tabs Take 1 tablet (10 mg total) by mouth 4 (four) times daily.   Oxycodone HCl 10 MG Tabs Take 1 tablet (10 mg total) by mouth 4 (four) times daily.   primidone 250 MG tablet Commonly known as:  MYSOLINE Take 1 tablet (250 mg total) by mouth every 8 (eight) hours.   risedronate 150 MG tablet Commonly known as:  ACTONEL TAKE 1 TAB EVERY THIRTY DAYS WITH WATER ON AN EMPTY STOMACH, NOTHING BY MOUTH OR LIE DOWN FOR 30MINS   risperiDONE 1 MG tablet Commonly known as:  RISPERDAL Take 1 tablet (1 mg total) by mouth at bedtime.   tizanidine 2 MG capsule Commonly known as:  ZANAFLEX Take 2 mg by mouth daily.   traZODone 50 MG tablet Commonly known as:  DESYREL Take 1 tablet (50 mg total) by mouth at bedtime and may repeat dose one time if needed.   valACYclovir 1000 MG tablet Commonly known as:  VALTREX TAKE 1 TABLET 2 TIMES DAILY. THEN STAY ON ONE TABLET DAILY AS PREVENTION   vitamin C 1000 MG tablet Take 1,000 mg by mouth daily.   WOMENS DAILY FORMULA PO Take 1 tablet by mouth every evening.          Objective:    BP (!) 143/95   Pulse 76   Temp (!) 97.5 F (36.4 C) (Oral)   Ht 5\' 7"  (1.702 m)   Wt 137 lb 12.8 oz (62.5 kg)   BMI 21.58 kg/m   No Known Allergies  Wt Readings from Last 3 Encounters:  06/12/18 137 lb 12.8 oz (62.5 kg)  05/18/18 134 lb 11.2 oz (61.1 kg)  04/10/18 134 lb 9.6 oz (61.1 kg)    Physical Exam Constitutional:      Appearance: She is well-developed.  HENT:     Head: Normocephalic and atraumatic.     Right Ear: Drainage and tenderness present.     Left Ear: Drainage and tenderness present.     Nose: Mucosal edema and rhinorrhea present.     Right Sinus: No maxillary sinus tenderness or frontal sinus tenderness.     Left Sinus: No maxillary sinus tenderness or frontal sinus tenderness.     Mouth/Throat:     Pharynx: Oropharyngeal exudate and posterior  oropharyngeal erythema present.  Eyes:     Conjunctiva/sclera: Conjunctivae normal.     Pupils: Pupils are equal, round, and reactive to light.  Neck:     Musculoskeletal: Normal range of motion and neck supple.  Cardiovascular:     Rate and Rhythm: Normal rate and regular rhythm.  Heart sounds: Normal heart sounds.  Pulmonary:     Effort: Pulmonary effort is normal.     Breath sounds: Examination of the right-upper field reveals wheezing. Examination of the left-upper field reveals wheezing. Wheezing present.  Abdominal:     General: Bowel sounds are normal.     Palpations: Abdomen is soft.  Skin:    General: Skin is warm and dry.     Findings: No rash.  Neurological:     Mental Status: She is alert and oriented to person, place, and time.     Deep Tendon Reflexes: Reflexes are normal and symmetric.  Psychiatric:        Behavior: Behavior normal.        Thought Content: Thought content normal.        Judgment: Judgment normal.     Results for orders placed or performed during the hospital encounter of 06/05/18  I-STAT creatinine  Result Value Ref Range   Creatinine, Ser 0.90 0.44 - 1.00 mg/dL      Assessment & Plan:   1. Bronchitis, acute, with bronchospasm - modafinil (PROVIGIL) 200 MG tablet; Take 200 mg by mouth 2 (two) times daily.; Refill: 5 - albuterol (PROVENTIL HFA;VENTOLIN HFA) 108 (90 Base) MCG/ACT inhaler; Inhale 2 puffs into the lungs every 4 (four) hours as needed for wheezing or shortness of breath.  Dispense: 1 Inhaler; Refill: 1 - methylPREDNISolone acetate (DEPO-MEDROL) injection 80 mg - azithromycin (ZITHROMAX) 250 MG tablet; Take as directed  Dispense: 6 tablet; Refill: 0   Continue all other maintenance medications as listed above.  Follow up plan: Return in about 4 weeks (around 07/10/2018) for recheck.  Educational handout given for [place] PA-C [facility] [address]  [place], [place]  [address] [phone]   06/17/2018, 10:07 PM

## 2018-06-25 ENCOUNTER — Ambulatory Visit (HOSPITAL_COMMUNITY)
Admission: RE | Admit: 2018-06-25 | Discharge: 2018-06-25 | Disposition: A | Discharge status: Home / Self Care | Payer: Medicare Other | Source: Ambulatory Visit | Attending: Physician Assistant | Admitting: Physician Assistant

## 2018-06-25 DIAGNOSIS — I700 Atherosclerosis of aorta: Secondary | ICD-10-CM | POA: Insufficient documentation

## 2018-06-25 DIAGNOSIS — N83201 Unspecified ovarian cyst, right side: Secondary | ICD-10-CM | POA: Insufficient documentation

## 2018-06-25 DIAGNOSIS — E278 Other specified disorders of adrenal gland: Secondary | ICD-10-CM

## 2018-06-25 DIAGNOSIS — D3501 Benign neoplasm of right adrenal gland: Secondary | ICD-10-CM | POA: Diagnosis not present

## 2018-06-25 MED ORDER — IOPAMIDOL (ISOVUE-300) INJECTION 61%
100.0000 mL | Freq: Once | INTRAVENOUS | Status: AC | PRN
  Administered 2018-06-25: 100 mL via INTRAVENOUS

## 2018-06-28 ENCOUNTER — Telehealth: Payer: Self-pay | Admitting: Physician Assistant

## 2018-06-28 ENCOUNTER — Encounter: Payer: Self-pay | Admitting: Emergency Medicine

## 2018-06-28 DIAGNOSIS — F329 Major depressive disorder, single episode, unspecified: Secondary | ICD-10-CM | POA: Insufficient documentation

## 2018-06-28 DIAGNOSIS — N83201 Unspecified ovarian cyst, right side: Secondary | ICD-10-CM

## 2018-06-28 DIAGNOSIS — F411 Generalized anxiety disorder: Secondary | ICD-10-CM

## 2018-06-29 NOTE — Telephone Encounter (Signed)
CT results reviewed with pt and pelvic u/s ordered per AJ.

## 2018-06-29 NOTE — Telephone Encounter (Signed)
Patient called again to get CT results. Please advise.

## 2018-07-03 ENCOUNTER — Ambulatory Visit: Payer: Medicare Other | Admitting: Internal Medicine

## 2018-07-03 ENCOUNTER — Encounter: Payer: Self-pay | Admitting: Internal Medicine

## 2018-07-03 VITALS — BP 142/82 | HR 60 | Ht 67.0 in | Wt 136.0 lb

## 2018-07-03 DIAGNOSIS — J449 Chronic obstructive pulmonary disease, unspecified: Secondary | ICD-10-CM | POA: Insufficient documentation

## 2018-07-03 DIAGNOSIS — E871 Hypo-osmolality and hyponatremia: Secondary | ICD-10-CM

## 2018-07-03 DIAGNOSIS — R0609 Other forms of dyspnea: Secondary | ICD-10-CM | POA: Insufficient documentation

## 2018-07-03 LAB — CBC WITH DIFFERENTIAL/PLATELET
Basophils Absolute: 0 10*3/uL (ref 0.0–0.1)
Basophils Relative: 0.8 % (ref 0.0–3.0)
EOS PCT: 2.8 % (ref 0.0–5.0)
Eosinophils Absolute: 0.1 10*3/uL (ref 0.0–0.7)
HCT: 37.6 % (ref 36.0–46.0)
HEMOGLOBIN: 13 g/dL (ref 12.0–15.0)
Lymphocytes Relative: 40.1 % (ref 12.0–46.0)
Lymphs Abs: 1.4 10*3/uL (ref 0.7–4.0)
MCHC: 34.6 g/dL (ref 30.0–36.0)
MCV: 98.1 fl (ref 78.0–100.0)
MONOS PCT: 11.3 % (ref 3.0–12.0)
Monocytes Absolute: 0.4 10*3/uL (ref 0.1–1.0)
Neutro Abs: 1.6 10*3/uL (ref 1.4–7.7)
Neutrophils Relative %: 45 % (ref 43.0–77.0)
Platelets: 235 10*3/uL (ref 150.0–400.0)
RBC: 3.83 Mil/uL — AB (ref 3.87–5.11)
RDW: 12.4 % (ref 11.5–15.5)
WBC: 3.6 10*3/uL — AB (ref 4.0–10.5)

## 2018-07-03 LAB — BRAIN NATRIURETIC PEPTIDE: PRO B NATRI PEPTIDE: 97 pg/mL (ref 0.0–100.0)

## 2018-07-03 LAB — BASIC METABOLIC PANEL
BUN: 7 mg/dL (ref 6–23)
CALCIUM: 9.3 mg/dL (ref 8.4–10.5)
CHLORIDE: 92 meq/L — AB (ref 96–112)
CO2: 26 meq/L (ref 19–32)
Creatinine, Ser: 0.82 mg/dL (ref 0.40–1.20)
GFR: 75.02 mL/min (ref >60.00)
GLUCOSE: 89 mg/dL (ref 70–99)
POTASSIUM: 4 meq/L (ref 3.5–5.1)
Sodium: 127 mEq/L — ABNORMAL LOW (ref 135–145)

## 2018-07-03 LAB — TSH: TSH: 1.75 u[IU]/mL (ref 0.35–4.50)

## 2018-07-03 MED ORDER — FAMOTIDINE 20 MG PO TABS: ORAL_TABLET | ORAL | 11 refills | Status: DC

## 2018-07-03 MED ORDER — BUDESONIDE-FORMOTEROL FUMARATE 80-4.5 MCG/ACT IN AERO: INHALATION_SPRAY | RESPIRATORY_TRACT | 12 refills | Status: DC

## 2018-07-03 MED ORDER — PANTOPRAZOLE SODIUM 40 MG PO TBEC: 40.0000 mg | DELAYED_RELEASE_TABLET | Freq: Every day | ORAL | 2 refills | Status: DC

## 2018-07-03 NOTE — Progress Notes (Signed)
Spoke with pt and notified of results per Dr. Wert. Pt verbalized understanding and denied any questions. 

## 2018-07-03 NOTE — Progress Notes (Signed)
Angel Maddox, female    DOB: May 05, 1956,     MRN: [id number]    Brief patient profile:  64 yowf quit smoking 1992 no resp sequelae with dx of MS late 90's with progressive doe x 2018 so referred to pulmonary clinic 07/03/2018 by Dr   Dellis Filbert    History of Present Illness  07/03/2018  Pulmonary/ 1st office eval/Filimon Miranda  Chief Complaint  Patient presents with  . Pulmonary Consult    Reffered by Dr Dellis Filbert. Pt c/o DOE for the past 1-2 years. She states she gets winded walking around her house and when she bends over.   Dyspnea:  Progressive x several years but esp last 6 months to point where doe = MMRC4  = sob if tries to leave home or while getting dressed   Cough: none Sleep: on side horizontally  SABA use: bid ? Helps some    No obvious day to day or daytime variability or assoc excess/ purulent sputum or mucus plugs or hemoptysis or cp or chest tightness, subjective wheeze or overt sinus or hb symptoms.   Sleeping  without nocturnal  or early am exacerbation  of respiratory  c/o's or need for noct saba. Also denies any obvious fluctuation of symptoms with weather or environmental changes or other aggravating or alleviating factors except as outlined above   No unusual exposure hx or h/o childhood pna/ asthma or knowledge of premature birth.  Current Allergies, Complete Past Medical History, Past Surgical History, Family History, and Social History were reviewed in [employer] [employer] record.  ROS  The following are not active complaints unless bolded Hoarseness, sore throat, dysphagia, dental problems, itching, sneezing,  nasal congestion or discharge of excess mucus or purulent secretions, ear ache,   fever, chills, sweats, unintended wt loss or wt gain, classically pleuritic or exertional cp,  orthopnea pnd or arm/hand swelling  or leg swelling, presyncope, palpitations, abdominal pain, anorexia, nausea, vomiting, diarrhea  or change in bowel habits or change in  bladder habits, change in stools or change in urine, dysuria, hematuria,  rash, arthralgias, visual complaints, headache, numbness, weakness or ataxia or problems with walking or coordination,  change in mood= anxious  or  memory.           Past Medical History:  Diagnosis Date  . Anxiety   . Arthritis    osteo  . Bipolar 1 disorder ([place])   . Cervicalgia   . Chronic kidney disease    kidney stone  . Depression   . Elevated liver enzymes    She says "normal now"  . GERD (gastroesophageal reflux disease)   . H/O hiatal hernia   . Headache   . Hip fracture ([place]) 2017   left  . History of kidney stones    4-5 yrs ago  . Hypertension   . Multiple sclerosis ([place])     Had for 15 years  . Multiple sclerosis ([place])    dx 1999  . Tremors of nervous system     Outpatient Medications Prior to Visit  Medication Sig Dispense Refill  . albuterol (PROVENTIL HFA;VENTOLIN HFA) 108 (90 Base) MCG/ACT inhaler Inhale 2 puffs into the lungs every 4 (four) hours as needed for wheezing or shortness of breath. 1 Inhaler 1  . Ascorbic Acid (VITAMIN C) 1000 MG tablet Take 1,000 mg by mouth daily.    Marland Kitchen azithromycin (ZITHROMAX) 250 MG tablet Take as directed 6 tablet 0  . buPROPion (WELLBUTRIN XL) 150 MG 24  hr tablet Take 1 tab po q am with a 300 mg tablet to equal total dose of 450 mg 30 tablet 2  . buPROPion (WELLBUTRIN XL) 300 MG 24 hr tablet TAKE 1 TABLET EVERY DAY 30 tablet 0  . calcium carbonate (OSCAL) 1500 (600 Ca) MG TABS tablet Take 1,500 mg by mouth daily with breakfast.    . citalopram (CELEXA) 40 MG tablet TAKE 1 TABLET (40 MG TOTAL) BY MOUTH DAILY WITH BREAKFAST.  3  . clonazePAM (KLONOPIN) 0.5 MG tablet Take 0.25 mg by mouth at bedtime. Takes half a tablet    . gabapentin (NEURONTIN) 800 MG tablet Take 400 mg by mouth 3 (three) times daily. Takes half tablet    . hydrochlorothiazide (HYDRODIURIL) 25 MG tablet Take 0.5 tablets (12.5 mg total) by mouth daily. 90 tablet 3  . ibuprofen  (ADVIL,MOTRIN) 200 MG tablet Take 800 mg by mouth every 8 (eight) hours as needed for moderate pain.    . meloxicam (MOBIC) 15 MG tablet Take 15 mg by mouth daily.    . methylphenidate (RITALIN) 20 MG tablet     . modafinil (PROVIGIL) 200 MG tablet Take 200 mg by mouth 2 (two) times daily.  5  . Multiple Vitamins-Minerals (WOMENS DAILY FORMULA PO) Take 1 tablet by mouth every evening.     Wess Botts (OCREVUS IV) Inject 600 mg into the vein every 6 (six) months. Then 600 mg every 6 months. Next infusion due 09/2017    . omeprazole (PRILOSEC) 20 MG capsule Take 1 capsule (20 mg total) by mouth daily. (Patient not taking: Reported on 06/13/2018) 30 capsule 11  . Oxycodone HCl 10 MG TABS Take 1 tablet (10 mg total) by mouth every 6 (six) hours as needed ((score 7 to 10)). 120 tablet 0  . Oxycodone HCl 10 MG TABS Take 1 tablet (10 mg total) by mouth 4 (four) times daily. 120 tablet 0  . Oxycodone HCl 10 MG TABS Take 1 tablet (10 mg total) by mouth 4 (four) times daily. 120 tablet 0  . primidone (MYSOLINE) 250 MG tablet Take 1 tablet (250 mg total) by mouth every 8 (eight) hours. 90 tablet 0  . risedronate (ACTONEL) 150 MG tablet TAKE 1 TAB EVERY THIRTY DAYS WITH WATER ON AN EMPTY STOMACH, NOTHING BY MOUTH OR LIE DOWN FOR 30MINS  3  . risperiDONE (RISPERDAL) 1 MG tablet Take 1 tablet (1 mg total) by mouth at bedtime. 90 tablet 1  . tizanidine (ZANAFLEX) 2 MG capsule Take 2 mg by mouth daily.     . traZODone (DESYREL) 50 MG tablet Take 1 tablet (50 mg total) by mouth at bedtime and may repeat dose one time if needed. (Patient taking differently: Take 100 mg by mouth at bedtime. ) 30 tablet 0  . valACYclovir (VALTREX) 1000 MG tablet TAKE 1 TABLET 2 TIMES DAILY. THEN STAY ON ONE TABLET DAILY AS PREVENTION (Patient taking differently: daily. ) 40 tablet 5       Objective:     BP (!) 142/82 (BP Location: Left Arm, Cuff Size: Normal)   Pulse 60   Ht 5\' 7"  (1.702 m)   Wt 136 lb (61.7 kg)   SpO2 98%    BMI 21.30 kg/m    Anxious chronically ill tremuous wf nad     HEENT: nl dentition / oropharynx. Nl external ear canals without cough reflex -  Mild bilateral non-specific turbinate edema     NECK :  without JVD/Nodes/TM/ nl carotid upstrokes bilaterally  LUNGS: no acc muscle use,  Mild barrel  contour chest wall with bilateral  Distant bs s audible wheeze and  without cough on insp or exp maneuver and mild  Hyperresonant  to  percussion bilaterally     CV:  RRR  no s3 or murmur or increase in P2, and no edema   ABD:  soft and nontender with pos late  insp Hoover's  in the supine position. No bruits or organomegaly appreciated, bowel sounds nl  MS:   Nl gait/  ext warm without deformities, calf tenderness, cyanosis or clubbing No obvious joint restrictions   SKIN: warm and dry without lesions    NEURO:  alert, approp, nl sensorium with  Resting tremor but no motor or cerebellar deficits apparent.         I personally reviewed images and agree with radiology impression as follows:   Chest CT a  06/05/18 1. No evidence of pulmonary embolism. 2. Moderate to marked central bronchial wall thickening indicating asthma and/or bronchitis. No acute cardiopulmonary disease otherwise. 3. Moderate three-vessel coronary atherosclerosis. 4. Incompletely imaged low-attenuation RIGHT mass, likely a benign adrenal adenoma. 5. Normal heart size with moderate three-vessel coronary Atherosclerosis.   Labs ordered 07/03/2018    Alpha one at [place] ordered/ reviewed:      Chemistry      Component Value Date/Time   NA 127 (L) 07/03/2018 1021   NA 130 (L) 05/01/2017 0836   K 4.0 07/03/2018 1021   CL 92 (L) 07/03/2018 1021   CO2 26 07/03/2018 1021   BUN 7 07/03/2018 1021   BUN 9 05/01/2017 0836   CREATININE 0.82 07/03/2018 1021      Component Value Date/Time   CALCIUM 9.3 07/03/2018 1021   ALKPHOS 93 05/01/2017 0836   AST 51 (H) 05/01/2017 0836   ALT 58 (H) 05/01/2017  0836   BILITOT 0.3 05/01/2017 0836        Lab Results  Component Value Date   WBC 3.6 (L) 07/03/2018   HGB 13.0 07/03/2018   HCT 37.6 07/03/2018   MCV 98.1 07/03/2018   PLT 235.0 07/03/2018        EOS                                                              0.1                                    07/03/2018       Lab Results  Component Value Date   TSH 1.75 07/03/2018     Lab Results  Component Value Date   PROBNP 97.0 07/03/2018      No results found for: ESRSEDRATE           Assessment   COPD  pfts pending  Quit smoking 1992 with onset doe 2018 in setting of MS/ anxiety  - Spirometry 07/03/2018  Not physiologic - 07/03/2018   Walked RA x 10 ft - stopped due to sob and sats 89%  - 07/03/2018  After extensive coaching inhaler device,  effectiveness =    50% > try symbicort 80 2bid and max gerd rx  - 07/03/2018 alpha one  screen sent     When respiratory symptoms begin or become refractory well after a patient reports complete smoking cessation,  Especially when this wasn't the case while they were smoking, a red flag is raised based on the work of Dr Kris Mouton which states:  if you quit smoking when your best day FEV1 is still well preserved it is highly unlikely you will progress to severe disease.  That is to say, once the smoking stops,  the symptoms should not suddenly erupt or markedly worsen.  If so, the differential diagnosis should include  obesity/deconditioning,  LPR/Reflux/Aspiration syndromes,  occult CHF, or  especially side effect of medications commonly used in this population.     >>>> For now just rx with low dose symbicort until she gets used to the hfa and max rx for gerd then regroup in 4 weeks with pfts on return.          DOE (dyspnea on exertion) No evidence of chf/ anemia/ thyroid dz by today's labs  Clearly multifactorial, not really sure copd is the limiting problem here with MS/ deconditioning/ anxiety all contributing > see  copd    Hyponatremia Chronic ? Etiology > rec avoid excess water and recheck per PCP     Total time devoted to counseling  > 50 % of initial 60 min office visit:  review case with pt/husband /   device teaching and directly observing ambulatory 02 sats study  which extended face to face time for this visit discussion of options/alternatives/ personally creating written customized instructions  in presence of pt  then going over those specific  Instructions directly with the pt including how to use all of the meds but in particular covering each new medication in detail and the difference between the maintenance= "automatic" meds and the prns using an action plan format for the latter (If this problem/symptom => do that organization reading Left to right).  Please see AVS from this visit for a full list of these instructions which I personally wrote for this pt and  are unique to this visit.      Christinia Gully, MD 07/03/2018

## 2018-07-03 NOTE — Assessment & Plan Note (Addendum)
Quit smoking 1992 with onset doe 2018 in setting of MS/ anxiety  - Spirometry 07/03/2018  Not physiologic - 07/03/2018   Walked RA x 10 ft - stopped due to sob and sats 89%  - 07/03/2018  After extensive coaching inhaler device,  effectiveness =    50% > try symbicort 80 2bid and max gerd rx  - 07/03/2018 alpha one screen sent     When respiratory symptoms begin or become refractory well after a patient reports complete smoking cessation,  Especially when this wasn't the case while they were smoking, a red flag is raised based on the work of Dr Kris Mouton which states:  if you quit smoking when your best day FEV1 is still well preserved it is highly unlikely you will progress to severe disease.  That is to say, once the smoking stops,  the symptoms should not suddenly erupt or markedly worsen.  If so, the differential diagnosis should include  obesity/deconditioning,  LPR/Reflux/Aspiration syndromes,  occult CHF, or  especially side effect of medications commonly used in this population.     >>>> For now just rx with low dose symbicort until she gets used to the hfa and max rx for gerd then regroup in 4 weeks with pfts on return.    Total time devoted to counseling  > 50 % of initial 60 min office visit:  review case with pt/husband /   device teaching and directly observing ambulatory 02 sats study  which extended face to face time for this visit discussion of options/alternatives/ personally creating written customized instructions  in presence of pt  then going over those specific  Instructions directly with the pt including how to use all of the meds but in particular covering each new medication in detail and the difference between the maintenance= "automatic" meds and the prns using an action plan format for the latter (If this problem/symptom => do that organization reading Left to right).  Please see AVS from this visit for a full list of these instructions which I personally wrote for this pt and  are  unique to this visit.

## 2018-07-03 NOTE — Patient Instructions (Addendum)
Plan A = Automatic = symbicort 80 Take 2 puffs first thing in am and then another 2 puffs about 12 hours later.   Work on inhaler technique:  relax and gently blow all the way out then take a nice smooth deep breath back in, triggering the inhaler at same time you start breathing in.  Hold for up to 5 seconds if you can. Blow out thru nose. Rinse and gargle with water when done  Also every day :  Pantoprazole (protonix) 40 mg   Take  30-60 min before first meal of the day and Pepcid (famotidine)  20 mg one after supper until return to office - this is the best way to tell whether stomach acid is contributing to your problem.    Plan B = Backup Only use your albuterol inhaler as a rescue medication to be used if you can't catch your breath by resting or doing a relaxed purse lip breathing pattern.  - The less you use it, the better it will work when you need it. - Ok to use the inhaler up to 2 puffs  every 4 hours if you must but call for appointment if use goes up over your usual need - Don't leave home without it !!  (think of it like the spare tire for your car)   GERD (REFLUX)  is an extremely common cause of respiratory symptoms just like yours , many times with no obvious heartburn at all.    It can be treated with medication, but also with lifestyle changes including elevation of the head of your bed (ideally with 6 -8inch blocks under the headboard of your bed),  Smoking cessation, avoidance of late meals, excessive alcohol, and avoid fatty foods, chocolate, peppermint, colas, red wine, and acidic juices such as orange juice.  NO MINT OR MENTHOL PRODUCTS SO NO COUGH DROPS  USE SUGARLESS CANDY INSTEAD (Jolley ranchers or Stover's or Life Savers) or even ice chips will also do - the key is to swallow to prevent all throat clearing. NO OIL BASED VITAMINS - use powdered substitutes.  Avoid fish oil when coughing.    Please remember to go to the lab department   for your tests - we will call  you with the results when they are available.      Please schedule a follow up office visit in 4 weeks, sooner if needed with pfts on return

## 2018-07-04 ENCOUNTER — Encounter: Payer: Self-pay | Admitting: Internal Medicine

## 2018-07-04 DIAGNOSIS — E871 Hypo-osmolality and hyponatremia: Secondary | ICD-10-CM | POA: Insufficient documentation

## 2018-07-04 NOTE — Assessment & Plan Note (Addendum)
No evidence of chf/ anemia/ thyroid dz by today's labs  Clearly multifactorial, not really sure copd is the limiting problem here with MS/ deconditioning/ anxiety all contributing > see copd

## 2018-07-04 NOTE — Assessment & Plan Note (Addendum)
Chronic ? Etiology > rec avoid excess water and recheck per PCP

## 2018-07-06 ENCOUNTER — Other Ambulatory Visit: Payer: Self-pay | Admitting: Psychiatry

## 2018-07-06 ENCOUNTER — Ambulatory Visit (INDEPENDENT_AMBULATORY_CARE_PROVIDER_SITE_OTHER): Payer: Medicare Other | Admitting: Psychiatry

## 2018-07-06 DIAGNOSIS — F331 Major depressive disorder, recurrent, moderate: Secondary | ICD-10-CM

## 2018-07-06 NOTE — Progress Notes (Signed)
      [facility] Counselor/Therapist Progress Note  Patient ID: Angel Maddox, MRN: [id number],    Date: 07/06/2018   Time Spent:  60 minutes  Treatment Type: Individual Therapy  Reported Symptoms:   Anxiety, depression, concerns re: her MS   Mental Status Exam:  Appearance:   Casual     Behavior:  Appropriate and Sharing  Motor:  Normal  Speech/Language:   Normal Rate  Affect:  Congruent  Mood:  anxious and depressed  Thought process:  normal  Thought content:    WNL  Sensory/Perceptual disturbances:    WNL  Orientation:  oriented to person, place, time/date, situation, day of week, month of year and year  Attention:  Good  Concentration:  Good  Memory:  Immediate;   Poor Recent;   Poor  Fund of knowledge:   Good  Insight:    Fair  Judgment:   Fair  Impulse Control:  Good   Risk Assessment: Danger to Self:  No Self-injurious Behavior: No Danger to Others: No Duty to Warn:no Physical Aggression / Violence:No  Access to Firearms a concern: No  Gang Involvement:No    Subjective: Patient in today reporting anxiety, depression, and continued concerns about her MS.  Talked at length about her MS and how she feels some symptoms have worsened including more weakness in her arms/ legs/ and hands.  Concerned that her memory seems to have declined more.  Experiences occasional breathing issues and she reports that has improved with use of her inhaler.  Understandably when her breathing is difficult, her anxiety heightens.  Today breathing is normal and anxiety seems to be more related to her MS and in discussing family concerns.   Support provided along with talking about some strategies that could help her in coping including setting better limits with others, reframing some things in more positive way, and trying not to make assumptions that are negative, while remaining on meds as prescribed and not exceeding the dosage on her oxycodone.    Interventions:  Solution-Oriented/Positive Psychology and Ego-Supportive  Diagnosis:   ICD-10-CM   1. Major depressive disorder, recurrent episode, moderate ([place]) F33.1     Plan: Patient to practice strategies noted above.  Is functioning and feeling better today compared to last visit.  To return in approx. 3 wks.  [name], LCSW

## 2018-07-09 LAB — ALPHA-1 ANTITRYPSIN PHENOTYPE: A1 ANTITRYPSIN SER: 111 mg/dL (ref 83–199)

## 2018-07-10 ENCOUNTER — Ambulatory Visit (INDEPENDENT_AMBULATORY_CARE_PROVIDER_SITE_OTHER): Payer: Medicare Other | Admitting: Physician Assistant

## 2018-07-10 ENCOUNTER — Encounter: Payer: Self-pay | Admitting: Physician Assistant

## 2018-07-10 DIAGNOSIS — M5136 Other intervertebral disc degeneration, lumbar region: Secondary | ICD-10-CM | POA: Diagnosis not present

## 2018-07-10 DIAGNOSIS — M47812 Spondylosis without myelopathy or radiculopathy, cervical region: Secondary | ICD-10-CM

## 2018-07-10 DIAGNOSIS — M5030 Other cervical disc degeneration, unspecified cervical region: Secondary | ICD-10-CM

## 2018-07-10 MED ORDER — OXYCODONE HCL 10 MG PO TABS: 10.0000 mg | ORAL_TABLET | Freq: Four times a day (QID) | ORAL | 0 refills | Status: DC | PRN

## 2018-07-10 MED ORDER — SPACER/AERO-HOLDING CHAMBERS DEVI: 1.0000 [IU] | Freq: Four times a day (QID) | 2 refills | Status: DC | PRN

## 2018-07-10 NOTE — Progress Notes (Signed)
Pt aware.

## 2018-07-12 NOTE — Progress Notes (Signed)
BP 123/83   Pulse 77   Temp 98.1 F (36.7 C) (Oral)   Ht 5\' 7"  (1.702 m)   Wt 136 lb (61.7 kg)   BMI 21.30 kg/m    Subjective:    Patient ID: Angel Maddox, female    DOB: 06-13-1956, 63 y.o.   MRN: [id number]  HPI: Angel Maddox is a 63 y.o. female presenting on 07/10/2018 for Shortness of Breath (4 week follow up)  This patient comes in for periodic recheck on medications and conditions including chronic pain from DDD and arthrtis.   All medications are reviewed today. There are no reports of any problems with the medications. All of the medical conditions are reviewed and updated.  Lab work is reviewed and will be ordered as medically necessary. There are no new problems reported with today's visit.   Past Medical History:  Diagnosis Date  . Anxiety   . Arthritis    osteo  . Bipolar 1 disorder ([place])   . Cervicalgia   . Chronic kidney disease    kidney stone  . Depression   . Elevated liver enzymes    She says "normal now"  . GERD (gastroesophageal reflux disease)   . H/O hiatal hernia   . Headache   . Hip fracture ([place]) 2017   left  . History of kidney stones    4-5 yrs ago  . Hypertension   . Multiple sclerosis ([place])     Had for 15 years  . Multiple sclerosis ([place])    dx 1999  . Tremors of nervous system    Relevant past medical, surgical, family and social history reviewed and updated as indicated. Interim medical history since our last visit reviewed. Allergies and medications reviewed and updated. DATA REVIEWED: CHART IN EPIC  Family History reviewed for pertinent findings.  Review of Systems  Constitutional: Negative.  Negative for activity change, fatigue and fever.  HENT: Negative.   Eyes: Negative.   Respiratory: Negative.  Negative for cough.   Cardiovascular: Negative.  Negative for chest pain.  Gastrointestinal: Negative.  Negative for abdominal pain.  Endocrine: Negative.   Genitourinary: Negative.  Negative for dysuria.    Musculoskeletal: Negative.   Skin: Negative.   Neurological: Negative.     Allergies as of 07/10/2018   No Known Allergies     Medication List       Accurate as of July 10, 2018 11:59 PM. Always use your most recent med list.        albuterol 108 (90 Base) MCG/ACT inhaler Commonly known as:  PROVENTIL HFA;VENTOLIN HFA Inhale 2 puffs into the lungs every 4 (four) hours as needed for wheezing or shortness of breath.   budesonide-formoterol 80-4.5 MCG/ACT inhaler Commonly known as:  SYMBICORT Take 2 puffs first thing in am and then another 2 puffs about 12 hours later.   buPROPion 300 MG 24 hr tablet Commonly known as:  WELLBUTRIN XL TAKE 1 TABLET EVERY DAY   buPROPion 150 MG 24 hr tablet Commonly known as:  WELLBUTRIN XL TAKE 1 TAB BY MOUTH IN THE MORNING WITH A 300 MG TABLET TO EQUAL TOTAL DOSE OF 450 MG   calcium carbonate 1500 (600 Ca) MG Tabs tablet Commonly known as:  OSCAL Take 1,500 mg by mouth daily with breakfast.   citalopram 40 MG tablet Commonly known as:  CELEXA TAKE 1 TABLET (40 MG TOTAL) BY MOUTH DAILY WITH BREAKFAST.   clonazePAM 0.5 MG tablet Commonly known as:  KLONOPIN Take  0.25 mg by mouth at bedtime. Takes half a tablet   famotidine 20 MG tablet Commonly known as:  PEPCID One at bedtime   gabapentin 800 MG tablet Commonly known as:  NEURONTIN Take 400 mg by mouth 3 (three) times daily. Takes half tablet   hydrochlorothiazide 25 MG tablet Commonly known as:  HYDRODIURIL Take 0.5 tablets (12.5 mg total) by mouth daily.   ibuprofen 200 MG tablet Commonly known as:  ADVIL,MOTRIN Take 800 mg by mouth every 8 (eight) hours as needed for moderate pain.   modafinil 200 MG tablet Commonly known as:  PROVIGIL Take 200 mg by mouth 2 (two) times daily.   OCREVUS IV Inject 600 mg into the vein every 6 (six) months. Then 600 mg every 6 months. Next infusion due 09/2017   Oxycodone HCl 10 MG Tabs Take 1 tablet (10 mg total) by mouth every 6  (six) hours as needed ((score 7 to 10)).   pantoprazole 40 MG tablet Commonly known as:  PROTONIX Take 1 tablet (40 mg total) by mouth daily. Take 30-60 min before first meal of the day   primidone 250 MG tablet Commonly known as:  MYSOLINE Take 1 tablet (250 mg total) by mouth every 8 (eight) hours.   risperiDONE 1 MG tablet Commonly known as:  RISPERDAL Take 1 tablet (1 mg total) by mouth at bedtime.   Spacer/Aero-Holding [employer] 1 Units by Does not apply route 4 (four) times daily as needed.   tizanidine 2 MG capsule Commonly known as:  ZANAFLEX Take 2 mg by mouth daily.   traZODone 50 MG tablet Commonly known as:  DESYREL Take 1 tablet (50 mg total) by mouth at bedtime and may repeat dose one time if needed.   valACYclovir 1000 MG tablet Commonly known as:  VALTREX TAKE 1 TABLET 2 TIMES DAILY. THEN STAY ON ONE TABLET DAILY AS PREVENTION   WOMENS DAILY FORMULA PO Take 1 tablet by mouth every evening.          Objective:    BP 123/83   Pulse 77   Temp 98.1 F (36.7 C) (Oral)   Ht 5\' 7"  (1.702 m)   Wt 136 lb (61.7 kg)   BMI 21.30 kg/m   No Known Allergies  Wt Readings from Last 3 Encounters:  07/10/18 136 lb (61.7 kg)  07/03/18 136 lb (61.7 kg)  06/12/18 137 lb 12.8 oz (62.5 kg)    Physical Exam Constitutional:      Appearance: She is well-developed.  HENT:     Head: Normocephalic and atraumatic.  Eyes:     Conjunctiva/sclera: Conjunctivae normal.     Pupils: Pupils are equal, round, and reactive to light.  Cardiovascular:     Rate and Rhythm: Normal rate and regular rhythm.     Heart sounds: Normal heart sounds.  Pulmonary:     Effort: Pulmonary effort is normal.     Breath sounds: Normal breath sounds.  Abdominal:     General: Bowel sounds are normal.     Palpations: Abdomen is soft.  Skin:    General: Skin is warm and dry.     Findings: No rash.  Neurological:     Mental Status: She is alert and oriented to person, place, and time.       Deep Tendon Reflexes: Reflexes are normal and symmetric.  Psychiatric:        Behavior: Behavior normal.        Thought Content: Thought content normal.  Judgment: Judgment normal.     Results for orders placed or performed in visit on 07/03/18  TSH  Result Value Ref Range   TSH 1.75 0.35 - 4.50 uIU/mL  CBC with Differential/Platelet  Result Value Ref Range   WBC 3.6 (L) 4.0 - 10.5 K/uL   RBC 3.83 (L) 3.87 - 5.11 Mil/uL   Hemoglobin 13.0 12.0 - 15.0 g/dL   HCT 37.6 36.0 - 46.0 %   MCV 98.1 78.0 - 100.0 fl   MCHC 34.6 30.0 - 36.0 g/dL   RDW 12.4 11.5 - 15.5 %   Platelets 235.0 150.0 - 400.0 K/uL   Neutrophils Relative % 45.0 43.0 - 77.0 %   Lymphocytes Relative 40.1 12.0 - 46.0 %   Monocytes Relative 11.3 3.0 - 12.0 %   Eosinophils Relative 2.8 0.0 - 5.0 %   Basophils Relative 0.8 0.0 - 3.0 %   Neutro Abs 1.6 1.4 - 7.7 K/uL   Lymphs Abs 1.4 0.7 - 4.0 K/uL   Monocytes Absolute 0.4 0.1 - 1.0 K/uL   Eosinophils Absolute 0.1 0.0 - 0.7 K/uL   Basophils Absolute 0.0 0.0 - 0.1 K/uL  Brain natriuretic peptide  Result Value Ref Range   Pro B Natriuretic peptide (BNP) 97.0 0.0 - 100.0 pg/mL  Basic metabolic panel  Result Value Ref Range   Sodium 127 (L) 135 - 145 mEq/L   Potassium 4.0 3.5 - 5.1 mEq/L   Chloride 92 (L) 96 - 112 mEq/L   CO2 26 19 - 32 mEq/L   Glucose, Bld 89 70 - 99 mg/dL   BUN 7 6 - 23 mg/dL   Creatinine, Ser 0.82 0.40 - 1.20 mg/dL   Calcium 9.3 8.4 - 10.5 mg/dL   GFR 75.02 >60.00 mL/min  Alpha-1 antitrypsin phenotype  Result Value Ref Range   A-1 Antitrypsin, Ser 111 83 - 199 mg/dL   ALPHA-1-ANTITRYPSIN (AAT) PHENOTYPE SEE NOTE       Assessment & Plan:   1. DDD (degenerative disc disease), cervical - Oxycodone HCl 10 MG TABS; Take 1 tablet (10 mg total) by mouth every 6 (six) hours as needed ((score 7 to 10)).  Dispense: 120 tablet; Refill: 0  2. DDD (degenerative disc disease), lumbar - Oxycodone HCl 10 MG TABS; Take 1 tablet (10 mg total)  by mouth every 6 (six) hours as needed ((score 7 to 10)).  Dispense: 120 tablet; Refill: 0  3. Facet arthritis of cervical region - Oxycodone HCl 10 MG TABS; Take 1 tablet (10 mg total) by mouth every 6 (six) hours as needed ((score 7 to 10)).  Dispense: 120 tablet; Refill: 0   Continue all other maintenance medications as listed above.  Follow up plan: Return in about 4 months (around 11/08/2018) for recheck.  Educational handout given for [place] PA-C [facility] [address]  [place], [place] [address] [phone]   07/12/2018, 9:31 PM

## 2018-07-17 ENCOUNTER — Ambulatory Visit (INDEPENDENT_AMBULATORY_CARE_PROVIDER_SITE_OTHER): Payer: Medicare Other | Admitting: Physician Assistant

## 2018-07-17 ENCOUNTER — Encounter: Payer: Self-pay | Admitting: Physician Assistant

## 2018-07-17 DIAGNOSIS — F331 Major depressive disorder, recurrent, moderate: Secondary | ICD-10-CM | POA: Diagnosis not present

## 2018-07-17 DIAGNOSIS — F411 Generalized anxiety disorder: Secondary | ICD-10-CM

## 2018-07-17 DIAGNOSIS — G4700 Insomnia, unspecified: Secondary | ICD-10-CM | POA: Diagnosis not present

## 2018-07-17 NOTE — Progress Notes (Signed)
[facility] Med Check  Patient ID: Angel Maddox,  MRN: [id number]  PCP: Terald Sleeper, PA-C  Date of Evaluation: 07/17/2018 Time spent:25 minutes  Chief Complaint:  Chief Complaint    Follow-up; Depression      HISTORY/CURRENT STATUS: HPI Here for med check, accompanied by [facility].   Last OV was 06/13/18, she saw Thayer Headings, NP emergently in my absence. Pt was having more depression since the MS is worsening.  Her Wellbutrin XL was increased from 300 mg to 450 mg and patient states she feels much better as far as the dep goes.  Able to get up off the couch, is more motivated and more energetic.  Unfortunately, the tremor is much worse since increasing the Wellbutrin.  States there are times that she is trying to push her rolling walker and her arm will shake so bad that it moves the walker itself.  Usually gets a fine motor tremor in her hands bilaterally.  Her husband agrees that she is much better, has a brighter outlook and is certainly more motivated to get up and do things.  She still gets extremely tired easily but she relates that to the MS.  She did a load of close this morning and was exhausted after doing that.  She has been going to the pool sometimes and walking in the water while she moves her arms.  States that does help her not to feel as weak.   Since the last visit she has seen a pulmonologist.  She was having some dyspnea and has been diagnosed with mild COPD.  She is a former smoker quitting many years ago.  Her new medications of Symbicort has helped that a lot.  Anxiety is well controlled.  Her husband Letitia Libra, takes care of all of her medications.  He reports that she was getting to dependent and taking too much of the Klonopin, oxycodone, and Zanaflex.  This is not a new issue and it has been helpful that he is in control of her medications.  At this point she is only getting the Klonopin in the evening.  Even then she still has some trouble sleeping, both  falling asleep and staying asleep.  The anniversary of her son's death is on 08-24-2022 and understandably that is making her sad.  States she does not cry all the time like she used to.  Individual Medical History/ Review of Systems: Changes? :Yes  New diagnosis of mild COPD.  Past medications for mental health diagnoses include: Depakote, Lamictal, Risperdal, Celexa, Klonopin, trazodone, Wellbutrin, Vistaril, primidone, gabapentin, Prozac, Zyprexa, Ambien, Lunesta, Effexor  Allergies: Patient has no known allergies.  Current Medications:  Current Outpatient Medications:  .  albuterol (PROVENTIL HFA;VENTOLIN HFA) 108 (90 Base) MCG/ACT inhaler, Inhale 2 puffs into the lungs every 4 (four) hours as needed for wheezing or shortness of breath., Disp: 1 Inhaler, Rfl: 1 .  budesonide-formoterol (SYMBICORT) 80-4.5 MCG/ACT inhaler, Take 2 puffs first thing in am and then another 2 puffs about 12 hours later., Disp: 1 Inhaler, Rfl: 12 .  buPROPion (WELLBUTRIN XL) 300 MG 24 hr tablet, TAKE 1 TABLET EVERY DAY, Disp: 30 tablet, Rfl: 0 .  citalopram (CELEXA) 40 MG tablet, TAKE 1 TABLET (40 MG TOTAL) BY MOUTH DAILY WITH BREAKFAST., Disp: , Rfl: 3 .  clonazePAM (KLONOPIN) 0.5 MG tablet, Take 0.25 mg by mouth at bedtime. Takes half a tablet, Disp: , Rfl:  .  gabapentin (NEURONTIN) 800 MG tablet, Take 400 mg by mouth 3 (  three) times daily. Takes half tablet, Disp: , Rfl:  .  ibuprofen (ADVIL,MOTRIN) 200 MG tablet, Take 800 mg by mouth every 8 (eight) hours as needed for moderate pain., Disp: , Rfl:  .  modafinil (PROVIGIL) 200 MG tablet, Take 200 mg by mouth 2 (two) times daily., Disp: , Rfl: 5 .  Oxycodone HCl 10 MG TABS, Take 1 tablet (10 mg total) by mouth every 6 (six) hours as needed ((score 7 to 10))., Disp: 120 tablet, Rfl: 0 .  pantoprazole (PROTONIX) 40 MG tablet, Take 1 tablet (40 mg total) by mouth daily. Take 30-60 min before first meal of the day, Disp: 30 tablet, Rfl: 2 .  risperiDONE  (RISPERDAL) 1 MG tablet, Take 1 tablet (1 mg total) by mouth at bedtime., Disp: 90 tablet, Rfl: 1 .  Spacer/Aero-Holding [employer] DEVI, 1 Units by Does not apply route 4 (four) times daily as needed., Disp: 1 each, Rfl: 2 .  tizanidine (ZANAFLEX) 2 MG capsule, Take 2 mg by mouth daily. , Disp: , Rfl:  .  traZODone (DESYREL) 50 MG tablet, Take 1 tablet (50 mg total) by mouth at bedtime and may repeat dose one time if needed. (Patient taking differently: Take 100 mg by mouth at bedtime. ), Disp: 30 tablet, Rfl: 0 .  valACYclovir (VALTREX) 1000 MG tablet, TAKE 1 TABLET 2 TIMES DAILY. THEN STAY ON ONE TABLET DAILY AS PREVENTION (Patient taking differently: daily. ), Disp: 40 tablet, Rfl: 5 .  calcium carbonate (OSCAL) 1500 (600 Ca) MG TABS tablet, Take 1,500 mg by mouth daily with breakfast., Disp: , Rfl:  .  famotidine (PEPCID) 20 MG tablet, One at bedtime (Patient not taking: Reported on 07/17/2018), Disp: 30 tablet, Rfl: 11 .  hydrochlorothiazide (HYDRODIURIL) 25 MG tablet, Take 0.5 tablets (12.5 mg total) by mouth daily. (Patient not taking: Reported on 07/17/2018), Disp: 90 tablet, Rfl: 3 .  Multiple Vitamins-Minerals (WOMENS DAILY FORMULA PO), Take 1 tablet by mouth every evening. , Disp: , Rfl:  .  Ocrelizumab (OCREVUS IV), Inject 600 mg into the vein every 6 (six) months. Then 600 mg every 6 months. Next infusion due 09/2017, Disp: , Rfl:  .  primidone (MYSOLINE) 250 MG tablet, Take 1 tablet (250 mg total) by mouth every 8 (eight) hours., Disp: 90 tablet, Rfl: 0 Medication Side Effects: Questionable tremor  Family Medical/ Social History: Changes? No  MENTAL HEALTH EXAM:  There were no vitals taken for this visit.There is no height or weight on file to calculate BMI.  General Appearance: Casual and Well Groomed walking with rolling walker   Eye Contact:  Good  Speech:  Clear and Coherent  Volume:  Normal  Mood:  Euthymic  Affect:  Appropriate  Thought Process:  Goal Directed  Orientation:   Full (Time, Place, and Person)  Thought Content: Logical   Suicidal Thoughts:  No  Homicidal Thoughts:  No  Memory:  WNL  Judgement:  Good  Insight:  Good  Psychomotor Activity:  uses rolling walker. fine motor tremor occas of hands bilat  Concentration:  Concentration: Good  Recall:  Good  Fund of Knowledge: Good  Language: Good  Assets:  Desire for Improvement  ADL's:  Intact  Cognition: WNL  Prognosis:  Good    DIAGNOSES:    ICD-10-CM   1. Major depressive disorder, recurrent episode, moderate (HCC) F33.1   2. Generalized anxiety disorder F41.1   3. Insomnia, unspecified type G47.00     Receiving Psychotherapy: Yes With [name], LCSW.  RECOMMENDATIONS: We discussed several options to try to decrease the tremor, if indeed it is related to her psych meds.  It does appear to be since it got worse after increasing the Wellbutrin. 1 option is to decrease the Wellbutrin XL back to 300 mg p.o. every morning, and see how she does now that the situation that worsened the depression, has improved.  She feels better physically because she is now on Symbicort and the MS seems to be better controlled as well.  Another option would be to wean off the Celexa and try Trintellix or Viibryd, and would still need to decrease or wean off of the Wellbutrin if that indeed is causing worsening of the tremor. I spent 25 minutes with the patient and her husband and 50% of that time was in counseling in reference to diagnosis and treatment options.  We decided to: Decrease Wellbutrin XL to 300 mg p.o. every morning.  Depending on how she does with that, whether her symptoms recur, or whether the tremor does not decrease back to baseline, we will either leave the Wellbutrin the same or consider a change at the next visit. Continue Celexa 40 mg p.o. daily. Continue Klonopin 0.5 mg nightly as needed. Continue gabapentin 800 mg 3 times daily.  (Neuro is prescribing.  Patient states she would like to get  off of that.  From a psychiatric standpoint I prefer that she does not as this helps anxiety as well as her physical symptoms.) Continue modafinil 200 mg 1 p.o. twice daily, (per neuro) Continue Risperdal 1 mg nightly. Continue trazodone 50 mg 1-1/2 nightly as needed. Continue therapy with [facility] CS[facility]. Return in 4 to 6 weeks.   Donnal Moat, PA-C

## 2018-07-18 ENCOUNTER — Ambulatory Visit (HOSPITAL_COMMUNITY)
Admission: RE | Admit: 2018-07-18 | Discharge: 2018-07-18 | Disposition: A | Discharge status: Home / Self Care | Payer: Medicare Other | Source: Ambulatory Visit | Attending: Physician Assistant | Admitting: Physician Assistant

## 2018-07-18 DIAGNOSIS — N83201 Unspecified ovarian cyst, right side: Secondary | ICD-10-CM | POA: Insufficient documentation

## 2018-07-31 ENCOUNTER — Ambulatory Visit: Payer: Medicare Other | Admitting: Physician Assistant

## 2018-08-02 ENCOUNTER — Encounter: Payer: Self-pay | Admitting: Internal Medicine

## 2018-08-02 ENCOUNTER — Ambulatory Visit (INDEPENDENT_AMBULATORY_CARE_PROVIDER_SITE_OTHER): Payer: Medicare Other | Admitting: Internal Medicine

## 2018-08-02 ENCOUNTER — Ambulatory Visit: Payer: Medicare Other | Admitting: Internal Medicine

## 2018-08-02 VITALS — BP 130/88 | HR 76 | Ht 67.0 in | Wt 136.2 lb

## 2018-08-02 DIAGNOSIS — J449 Chronic obstructive pulmonary disease, unspecified: Secondary | ICD-10-CM

## 2018-08-02 DIAGNOSIS — R0609 Other forms of dyspnea: Secondary | ICD-10-CM

## 2018-08-02 NOTE — Patient Instructions (Addendum)
Plan A = Automatic = Symbicort 80 Take 2 puffs first thing in am and then another 2 puffs about 12 hours later.   Work on inhaler technique:  relax and gently blow all the way out then take a nice smooth deep breath back in, triggering the inhaler at same time you start breathing in.  Hold for up to 5 seconds if you can. Blow out thru nose. Rinse and gargle with water when done.     Plan B = Backup Only use your albuterol inhaler as a rescue medication to be used if you can't catch your breath by resting or doing a relaxed purse lip breathing pattern.  - The less you use it, the better it will work when you need it. - Ok to use the inhaler up to 2 puffs  every 4 hours if you must but call for appointment if use goes up over your usual need - Don't leave home without it !!  (think of it like the spare tire for your car)   GERD (REFLUX)  is an extremely common cause of respiratory symptoms just like yours , many times with no obvious heartburn at all.    It can be treated with medication, but also with lifestyle changes including elevation of the head of your bed (ideally with 6 -8inch blocks under the headboard of your bed),  Smoking cessation, avoidance of late meals, excessive alcohol, and avoid fatty foods, chocolate, peppermint, colas, red wine, and acidic juices such as orange juice.  NO MINT OR MENTHOL PRODUCTS SO NO COUGH DROPS  USE SUGARLESS CANDY INSTEAD (Jolley ranchers or Stover's or Life Savers) or even ice chips will also do - the key is to swallow to prevent all throat clearing. NO OIL BASED VITAMINS - use powdered substitutes.  Avoid fish oil when coughing.     Please schedule a follow up office visit in 6 weeks, call sooner if needed with all respiratory medications/devices /inhalers/ solutions in hand so we can verify exactly what you are taking. This includes all medications from all doctors and over the counters  - need to try spirometry on return if willing and if not  breathing better add lama next

## 2018-08-02 NOTE — Progress Notes (Signed)
PFT was attempted today. After attempting 2 spirometry tests, the pt became anxious and began crying. Stated, "I can't do this test." I spoke with Dr. Melvyn Novas and he advised him to stop the test.

## 2018-08-02 NOTE — Progress Notes (Signed)
Angel Maddox, female    DOB: 1956-02-09,     MRN: [id number]    Brief patient profile:  27 yowf quit smoking 1992 with MS phenotype with ok alpha one levels and no  apparent resp sequelae from smoking with dx of Multiple sclerosis  late 90's with progressive doe x 2018 so referred to pulmonary clinic 07/03/2018 by Dr   Dellis Filbert    History of Present Illness  07/03/2018  Pulmonary/ 1st office eval/[facility]  Chief Complaint  Patient presents with  . Pulmonary Consult    Reffered by Dr Dellis Filbert. Pt c/o DOE for the past 1-2 years. She states she gets winded walking around her house and when she bends over.   Dyspnea:  Progressive x several years but esp last 6 months to point where doe = MMRC4  = sob if tries to leave home or while getting dressed   Cough: none Sleep: on side horizontally  SABA use: bid ? Helps some  rec Plan A = Automatic = symbicort 80 Take 2 puffs first thing in am and then another 2 puffs about 12 hours later.  Work on inhaler technique:     Pantoprazole (protonix) 40 mg   Take  30-60 min before first meal of the day and Pepcid (famotidine)  20 mg one after supper until return to office - this is the best way to tell whether stomach acid is contributing to your problem.   Plan B = Backup Only use your albuterol inhaler as a rescue medication   GERD diet    08/02/2018  f/u ov/[facility]ert re:  Chief Complaint  Patient presents with  . Follow-up    ? copd   Dyspnea:  Room to room doe but improved, using  rollator  Cough: no Sleeping: on side bed flat SABA use: on symb 80 not needing any saba  02: none Midline cp x months comes and goes happens sitting just as much as walking /  never wakes her up and no assoc nausea/ hb/ dysphagia/ seems more positional than pleuritic  No obvious day to day or daytime variability or assoc excess/ purulent sputum or mucus plugs or hemoptysis or   subjective wheeze or overt sinus or hb symptoms.   Sleeping as above  without nocturnal  or  early am exacerbation  of respiratory  c/o's or need for noct saba. Also denies any obvious fluctuation of symptoms with weather or environmental changes or other aggravating or alleviating factors except as outlined above   No unusual exposure hx or h/o childhood pna/ asthma or knowledge of premature birth.  Current Allergies, Complete Past Medical History, Past Surgical History, Family History, and Social History were reviewed in [employer] record.  ROS  The following are not active complaints unless bolded Hoarseness, sore throat, dysphagia, dental problems, itching, sneezing,  nasal congestion or discharge of excess mucus or purulent secretions, ear ache,   fever, chills, sweats, unintended wt loss or wt gain, classically pleuritic or exertional cp,  orthopnea pnd or arm/hand swelling  or leg swelling, presyncope, palpitations, abdominal pain, anorexia, nausea, vomiting, diarrhea  or change in bowel habits or change in bladder habits, change in stools or change in urine, dysuria, hematuria,  rash, arthralgias, visual complaints, headache, numbness, weakness or ataxia or problems with walking or coordination,  change in mood or  memory.        Current Meds  Medication Sig  . buPROPion (WELLBUTRIN XL) 300 MG 24 hr tablet TAKE  1 TABLET EVERY DAY (Patient taking differently: 450 mg daily. Takes 1 and 1/2 tablets daily)  . calcium carbonate (OSCAL) 1500 (600 Ca) MG TABS tablet Take 1,500 mg by mouth daily with breakfast.  . citalopram (CELEXA) 40 MG tablet TAKE 1 TABLET (40 MG TOTAL) BY MOUTH DAILY WITH BREAKFAST.  . clonazePAM (KLONOPIN) 0.5 MG tablet Take 0.25 mg by mouth at bedtime. Takes half a tablet  . famotidine (PEPCID) 20 MG tablet One at bedtime  . gabapentin (NEURONTIN) 800 MG tablet Take 400 mg by mouth 3 (three) times daily. Takes half tablet  . hydrochlorothiazide (HYDRODIURIL) 25 MG tablet Take 0.5 tablets (12.5 mg total) by mouth daily.  Marland Kitchen ibuprofen  (ADVIL,MOTRIN) 200 MG tablet Take 800 mg by mouth every 8 (eight) hours as needed for moderate pain.  . modafinil (PROVIGIL) 200 MG tablet Take 200 mg by mouth 2 (two) times daily.  . Multiple Vitamins-Minerals (WOMENS DAILY FORMULA PO) Take 1 tablet by mouth every evening.   Wess Botts (OCREVUS IV) Inject 600 mg into the vein every 6 (six) months. Then 600 mg every 6 months. Next infusion due 09/2017  . Oxycodone HCl 10 MG TABS Take 1 tablet (10 mg total) by mouth every 6 (six) hours as needed ((score 7 to 10)).  Marland Kitchen pantoprazole (PROTONIX) 40 MG tablet Take 1 tablet (40 mg total) by mouth daily. Take 30-60 min before first meal of the day  . primidone (MYSOLINE) 250 MG tablet Take 1 tablet (250 mg total) by mouth every 8 (eight) hours.  . risperiDONE (RISPERDAL) 1 MG tablet Take 1 tablet (1 mg total) by mouth at bedtime.  Marland Kitchen Spacer/Aero-Holding Chambers DEVI 1 Units by Does not apply route 4 (four) times daily as needed.  . tizanidine (ZANAFLEX) 2 MG capsule Take 2 mg by mouth daily.   . traZODone (DESYREL) 50 MG tablet Take 1 tablet (50 mg total) by mouth at bedtime and may repeat dose one time if needed. (Patient taking differently: Take 100 mg by mouth at bedtime. )  . valACYclovir (VALTREX) 1000 MG tablet TAKE 1 TABLET 2 TIMES DAILY. THEN STAY ON ONE TABLET DAILY AS PREVENTION (Patient taking differently: daily. )                      Objective:     amb somber wf nad   Wt Readings from Last 3 Encounters:  08/02/18 136 lb 3.2 oz (61.8 kg)  07/10/18 136 lb (61.7 kg)  07/03/18 136 lb (61.7 kg)     Vital signs reviewed - Note on arrival 02 sats  94% on RA       HEENT: nl dentition / oropharynx. Nl external ear canals without cough reflex -  Mild bilateral non-specific turbinate edema     NECK :  without JVD/Nodes/TM/ nl carotid upstrokes bilaterally   LUNGS: no acc muscle use,  Mild barrel  contour chest wall with bilateral  Distant bs s audible wheeze and  without cough on  insp or exp maneuver and mild  Hyperresonant  to  percussion bilaterally     CV:  RRR  no s3 or murmur or increase in P2, and no edema   ABD:  soft and nontender with pos late  insp Hoover's  in the supine position. No bruits or organomegaly appreciated, bowel sounds nl  MS:   Walks with rollator/ ext warm without deformities, calf tenderness, cyanosis or clubbing No obvious joint restrictions   SKIN: warm and dry  without lesions    NEURO:  alert, approp, nl sensorium with resting tremor both hands but  no motor or cerebellar deficits apparent.            I personally reviewed images and agree with radiology impression as follows:   Chest CT a  06/05/18 1. No evidence of pulmonary embolism. 2. Moderate to marked central bronchial wall thickening indicating asthma and/or bronchitis. No acute cardiopulmonary disease otherwise. 3. Moderate three-vessel coronary atherosclerosis. 4. Incompletely imaged low-attenuation RIGHT mass, likely a benign adrenal adenoma. 5. Normal heart size with moderate three-vessel coronary Atherosclerosis.          Assessment

## 2018-08-03 ENCOUNTER — Ambulatory Visit (INDEPENDENT_AMBULATORY_CARE_PROVIDER_SITE_OTHER): Payer: Medicare Other | Admitting: Psychiatry

## 2018-08-03 ENCOUNTER — Encounter: Payer: Self-pay | Admitting: Internal Medicine

## 2018-08-03 DIAGNOSIS — F331 Major depressive disorder, recurrent, moderate: Secondary | ICD-10-CM | POA: Diagnosis not present

## 2018-08-03 NOTE — Progress Notes (Signed)
      [facility] Counselor/Therapist Progress Note  Patient ID: YECENIA DALGLEISH, MRN: [id number],    Date: 08/03/2018  Time Spent: 58 minutes  Treatment Type: Individual Therapy  Reported Symptoms:   Depressed ("not severely"), some tearfulness, frustrated (in marriage), anxiety  Mental Status Exam:  Appearance:   Casual     Behavior:  Appropriate and Sharing  Motor:  Normal  Speech/Language:   Normal Rate  Affect:  Congruent  Mood:  anxious, depressed and frustrated  Thought process:  normal  Thought content:    WNL  Sensory/Perceptual disturbances:    WNL  Orientation:  oriented to person, place, time/date, situation, day of week, month of year and year  Attention:  Fair  Concentration:  Fair  Memory:  Fair, per patient  Fund of knowledge:   Good  Insight:    Fair  Judgment:   Fair  Impulse Control:  Good   Risk Assessment: Danger to Self:  No Self-injurious Behavior: No Danger to Others: No Duty to Warn:no Physical Aggression / Violence:No  Access to Firearms a concern: No  Gang Involvement:No   Subjective:  Patient in today with above-mentioned symptoms.  Frustrated at home with marital relationship, which seems to be more about their difficulty communicating.  Feels they don't have much quality time together and husband now hangs out some with former co-workers at work site, which decreases his time at home.   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 Focused on saying things in a way that her message might be better received, versus leading to more conflict.  Helping her understand "how we say things is as important as what we say."    Also worked some grief issues that arose again recently with anniversary of son, Ardath Sax, death 4 yrs ago.  Was able to talk freely and express sadness and missing [name], however did not "get stuck", and eventually moved on in the conversation, reflecting later on how she feels she has healed some and is still  healing.   Interventions: Solution-Oriented/Positive Psychology and Ego-Supportive  Diagnosis:   ICD-10-CM   1. Major depressive disorder, recurrent episode, moderate ([place]) F33.1     Plan:  Patient to work on communication strategies mentioned above as well as strategies to help with her depressed mood.  Recognizing some of her progress despite having a rougher time since prior appt.  To return in 3-4 wks  [name], LCSW

## 2018-08-03 NOTE — Assessment & Plan Note (Addendum)
Quit smoking 1992 with onset doe 2018 in setting of MS/ anxiety  - Spirometry 07/03/2018  Not physiologic - 07/03/2018   Walked RA x 10 ft - stopped due to sob and sats 89%   - 07/03/2018 alpha one screen   MS  Level 111  - 08/02/2018  After extensive coaching inhaler device,  effectiveness =    75% > continue symbicort 80 2bid  She has improved on empiric symbicort 80 despite suboptimal hfa so I presume she has an AB component and no additional rx needed for now - can add lama next if not satisfied re activity limitation related to breathing but note she is mostly limited at this point by her MS - doubt we could get her into pulmonary rehab without pfts previously but will discuss this on return after attempt spirometry again.    I had an extended discussion with the patient reviewing all relevant studies completed to date and  lasting 15 to 20 minutes of a 25 minute visit    See device teaching which extended face to face time for this visit.  Each maintenance medication was reviewed in detail including emphasizing most importantly the difference between maintenance and prns and under what circumstances the prns are to be triggered using an action plan format that is not reflected in the computer generated alphabetically organized AVS which I have not found useful in most complex patients, especially with respiratory illnesses  Please see AVS for specific instructions unique to this visit that I personally wrote and verbalized to the the pt in detail and then reviewed with pt  by my nurse highlighting any  changes in therapy recommended at today's visit to their plan of care.

## 2018-08-05 LAB — PULMONARY FUNCTION TEST
FEF 25-75 PRE: 1.27 L/s
FEF2575-%Pred-Pre: 52 %
FEV1-%PRED-PRE: 78 %
FEV1-PRE: 2.18 L
FEV1FVC-%Pred-Pre: 79 %
FEV6-%Pred-Pre: 100 %
FEV6-Pre: 3.52 L
FEV6FVC-%Pred-Pre: 103 %
FVC-%Pred-Pre: 97 %
FVC-Pre: 3.53 L
PRE FEV1/FVC RATIO: 62 %
Pre FEV6/FVC Ratio: 100 %

## 2018-08-15 ENCOUNTER — Encounter: Payer: Self-pay | Admitting: Physician Assistant

## 2018-08-15 ENCOUNTER — Ambulatory Visit (INDEPENDENT_AMBULATORY_CARE_PROVIDER_SITE_OTHER): Payer: Medicare Other | Admitting: Physician Assistant

## 2018-08-15 DIAGNOSIS — F331 Major depressive disorder, recurrent, moderate: Secondary | ICD-10-CM | POA: Diagnosis not present

## 2018-08-15 DIAGNOSIS — F411 Generalized anxiety disorder: Secondary | ICD-10-CM

## 2018-08-15 DIAGNOSIS — G4700 Insomnia, unspecified: Secondary | ICD-10-CM

## 2018-08-15 MED ORDER — MIRTAZAPINE 15 MG PO TABS: 7.5000 mg | ORAL_TABLET | Freq: Every evening | ORAL | 1 refills | Status: DC | PRN

## 2018-08-15 MED ORDER — BUPROPION HCL ER (SR) 200 MG PO TB12: 200.0000 mg | ORAL_TABLET | Freq: Two times a day (BID) | ORAL | 1 refills | Status: DC

## 2018-08-15 NOTE — Progress Notes (Signed)
[facility] Med Check  Patient ID: Angel Maddox,  MRN: [id number]  PCP: Terald Sleeper, PA-C  Date of Evaluation: 08/15/18 Time spent:15 minutes  Chief Complaint:  Chief Complaint    Follow-up      HISTORY/CURRENT STATUS: HPI Here for 1 month f/u.  We had decreased the Wellbutrin b/c tremor.  The tremor got better, But her energy and motivation decreased so she went back up to 450mg . Now the tremor is worse again.  "The Wellbutrin makes me want to do things, but I shake worse.  I've got to where I just want to sleep all the time though.  But I wake up in the middle of the night and stay awake for 3-4 hours, then go back to sleep, and get up, have breakfast and take another nap.  I might sleep about 14 hours a day."  States she has trouble falling asleep as well.  Patient denies increased energy with decreased need for sleep, no increased talkativeness, no racing thoughts, no impulsivity or risky behaviors, no increased spending, no increased libido, no grandiosity.  Neuro sx are the same.  She is still seeing neuro for the MS.  No new changes have been made.  She does have to use her rolling walker most of the time.  And she is being fitted for a motorized wheelchair to use when needed.  Denies muscle or joint pain, or dystonia.  She does have stiffness because of the MS.  Denies dizziness, syncope, seizures, numbness, tingling, tics, slurred speech, confusion.   Individual Medical History/ Review of Systems: Changes? :No    Past medications for mental health diagnoses include: Depakote, Lamictal, Risperdal, Celexa, Klonopin, trazodone, Wellbutrin, Vistaril, primidone, gabapentin, Prozac, Zyprexa, Ambien, Lunesta, Effexor  Allergies: Hydrocodone  Current Medications:  Current Outpatient Medications:  .  albuterol (PROVENTIL HFA;VENTOLIN HFA) 108 (90 Base) MCG/ACT inhaler, Inhale 2 puffs into the lungs every 4 (four) hours as needed for wheezing or shortness of breath.,  Disp: 1 Inhaler, Rfl: 1 .  budesonide-formoterol (SYMBICORT) 80-4.5 MCG/ACT inhaler, Take 2 puffs first thing in am and then another 2 puffs about 12 hours later., Disp: 1 Inhaler, Rfl: 12 .  calcium carbonate (OSCAL) 1500 (600 Ca) MG TABS tablet, Take 1,500 mg by mouth daily with breakfast., Disp: , Rfl:  .  citalopram (CELEXA) 40 MG tablet, TAKE 1 TABLET (40 MG TOTAL) BY MOUTH DAILY WITH BREAKFAST., Disp: , Rfl: 3 .  clonazePAM (KLONOPIN) 0.5 MG tablet, Take 0.25 mg by mouth at bedtime. Takes half a tablet, Disp: , Rfl:  .  famotidine (PEPCID) 20 MG tablet, One at bedtime, Disp: 30 tablet, Rfl: 11 .  gabapentin (NEURONTIN) 800 MG tablet, Take 400 mg by mouth 3 (three) times daily. Takes half tablet, Disp: , Rfl:  .  hydrochlorothiazide (HYDRODIURIL) 25 MG tablet, Take 0.5 tablets (12.5 mg total) by mouth daily., Disp: 90 tablet, Rfl: 3 .  ibuprofen (ADVIL,MOTRIN) 200 MG tablet, Take 800 mg by mouth every 8 (eight) hours as needed for moderate pain., Disp: , Rfl:  .  Ocrelizumab (OCREVUS IV), Inject 600 mg into the vein every 6 (six) months. Then 600 mg every 6 months. Next infusion due 09/2017, Disp: , Rfl:  .  Oxycodone HCl 10 MG TABS, Take 1 tablet (10 mg total) by mouth every 6 (six) hours as needed ((score 7 to 10))., Disp: 120 tablet, Rfl: 0 .  pantoprazole (PROTONIX) 40 MG tablet, Take 1 tablet (40 mg total) by mouth daily. Take 30-60  min before first meal of the day, Disp: 30 tablet, Rfl: 2 .  primidone (MYSOLINE) 250 MG tablet, Take 1 tablet (250 mg total) by mouth every 8 (eight) hours., Disp: 90 tablet, Rfl: 0 .  risperiDONE (RISPERDAL) 1 MG tablet, Take 1 tablet (1 mg total) by mouth at bedtime., Disp: 90 tablet, Rfl: 1 .  Spacer/Aero-Holding Chambers DEVI, 1 Units by Does not apply route 4 (four) times daily as needed., Disp: 1 each, Rfl: 2 .  tizanidine (ZANAFLEX) 2 MG capsule, Take 2 mg by mouth daily. , Disp: , Rfl:  .  valACYclovir (VALTREX) 1000 MG tablet, TAKE 1 TABLET 2 TIMES  DAILY. THEN STAY ON ONE TABLET DAILY AS PREVENTION (Patient taking differently: daily. ), Disp: 40 tablet, Rfl: 5 .  buPROPion (WELLBUTRIN SR) 200 MG 12 hr tablet, Take 1 tablet (200 mg total) by mouth 2 (two) times daily., Disp: 60 tablet, Rfl: 1 .  mirtazapine (REMERON) 15 MG tablet, Take 0.5-1 tablets (7.5-15 mg total) by mouth at bedtime as needed., Disp: 30 tablet, Rfl: 1 .  modafinil (PROVIGIL) 200 MG tablet, Take 200 mg by mouth daily., Disp: , Rfl: 5 .  Multiple Vitamins-Minerals (WOMENS DAILY FORMULA PO), Take 1 tablet by mouth every evening. , Disp: , Rfl:  Medication Side Effects: none  Family Medical/ Social History: Changes? No  MENTAL HEALTH EXAM:  There were no vitals taken for this visit.There is no height or weight on file to calculate BMI.  General Appearance: Casual and Well Groomed  Eye Contact:  Good  Speech:  Clear and Coherent  Volume:  Normal  Mood:  Euthymic  Affect:  Appropriate  Thought Process:  Goal Directed  Orientation:  Full (Time, Place, and Person)  Thought Content: Logical   Suicidal Thoughts:  No  Homicidal Thoughts:  No  Memory:  WNL  Judgement:  Good  Insight:  Good  Psychomotor Activity:  Tremor fine motor of hands bilat on occas.  Walks with a rolling walker.   Concentration:  Concentration: Good  Recall:  Good  Fund of Knowledge: Good  Language: Good  Assets:  Desire for Improvement  ADL's:  Intact  Cognition: WNL  Prognosis:  Good    DIAGNOSES:    ICD-10-CM   1. Major depressive disorder, recurrent episode, moderate ([facility]) F33.1   2. Insomnia, unspecified type G47.00   3. Generalized anxiety disorder F41.1     Receiving Psychotherapy: Yes with [name], LCSW   RECOMMENDATIONS: Change Wellbutrin XL 450 mg to Wellbutrin SR 200 mg twice daily.  Hopefully this will still help with motivation and energy level but decrease the tremor somewhat.  I am not really sure if the tremor is related to the Wellbutrin or not but it does seem  to be at least with a higher dose. Continue Celexa 40 mg daily. Continue Klonopin 0.5 mg half to 1 daily PRN. Continue gabapentin 400 mg 3 times daily. DC trazodone. Start Remeron 15 mg 1/2-1 nightly as needed sleep.  We discussed sleep hygiene.  Try not to take naps during the day. Continue modafinil 200 mg daily. Continue Risperdal 1 mg nightly. Continue psychotherapy with [name], LCSW. Return in 6 weeks or sooner as needed.  Donnal Moat, PA-C

## 2018-08-20 ENCOUNTER — Other Ambulatory Visit: Payer: Self-pay | Admitting: Physician Assistant

## 2018-08-20 ENCOUNTER — Telehealth: Payer: Self-pay | Admitting: Physician Assistant

## 2018-08-20 NOTE — Telephone Encounter (Signed)
Angel Maddox called crying.  She wants to discuss medications.  Said she is just on too many meds through her various drs. And just can't handle all of them.  Needs help weaning down. Please call to discuss.  Next appt 09/26/18

## 2018-08-20 NOTE — Telephone Encounter (Signed)
Left voice mail to call back 

## 2018-08-20 NOTE — Telephone Encounter (Signed)
I'm willing to wean off Risperdal but do not want to wean off antideps.  She has not done well when we've tried to decrease in the past. Send in Rx for  0.25mg  , 2 qhs for 7 days, then 1 qhs for 7days then stop. I'll pend order.

## 2018-08-21 ENCOUNTER — Other Ambulatory Visit: Payer: Self-pay | Admitting: Physician Assistant

## 2018-08-21 DIAGNOSIS — J209 Acute bronchitis, unspecified: Secondary | ICD-10-CM

## 2018-08-21 MED ORDER — RISPERIDONE 0.25 MG PO TABS: ORAL_TABLET | ORAL | 0 refills | Status: DC

## 2018-08-21 NOTE — Telephone Encounter (Signed)
Pt agreed and states uses [facility] in [place]. Instructed to call back in two weeks.

## 2018-08-21 NOTE — Telephone Encounter (Signed)
Please adPt very tearful on phone, upset about all "these" medications she's on. Tremors are worse and she sleeps all the time. Just wants something to change. Encouraged her that provider doesn't want her to come off antidepressants, and making too many changes at once would hinder her more than help her. Given instructions on tapering off Risperdal and verbalized understanding. Asking about Wellbutrin? States she's only taking 1 a day and wants to know how to come off of it the right way because she wants to stop.   Please advise

## 2018-08-21 NOTE — Telephone Encounter (Signed)
I agree, I don't want her to change too many things at once.  In 2 weeks, call and let us know how she is.  If she's still having trouble, then I will consider getting off the Wellbutrin.

## 2018-08-22 ENCOUNTER — Ambulatory Visit (INDEPENDENT_AMBULATORY_CARE_PROVIDER_SITE_OTHER): Payer: Medicare Other | Admitting: Psychiatry

## 2018-08-22 DIAGNOSIS — F411 Generalized anxiety disorder: Secondary | ICD-10-CM | POA: Diagnosis not present
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
## 2018-09-04 ENCOUNTER — Encounter (HOSPITAL_COMMUNITY): Payer: Self-pay

## 2018-09-04 ENCOUNTER — Encounter (HOSPITAL_COMMUNITY)
Admission: RE | Admit: 2018-09-04 | Discharge: 2018-09-04 | Disposition: A | Discharge status: Home / Self Care | Payer: Medicare Other | Source: Ambulatory Visit | Attending: Psychiatry | Admitting: Psychiatry

## 2018-09-04 DIAGNOSIS — G35 Multiple sclerosis: Secondary | ICD-10-CM | POA: Insufficient documentation

## 2018-09-04 HISTORY — DX: Chronic obstructive pulmonary disease, unspecified: J44.9

## 2018-09-04 MED ORDER — METHYLPREDNISOLONE SODIUM SUCC 125 MG IJ SOLR
125.0000 mg | INTRAMUSCULAR | Status: DC
  Administered 2018-09-04: 125 mg via INTRAVENOUS
  Filled 2018-09-04: qty 2

## 2018-09-04 MED ORDER — DIPHENHYDRAMINE HCL 50 MG/ML IJ SOLN
25.0000 mg | INTRAMUSCULAR | Status: DC
  Administered 2018-09-04: 50 mg via INTRAVENOUS
  Filled 2018-09-04: qty 1

## 2018-09-04 MED ORDER — SODIUM CHLORIDE 0.9 % IV SOLN
1000.0000 mg | INTRAVENOUS | Status: DC
  Administered 2018-09-04: 1000 mg via INTRAVENOUS
  Filled 2018-09-04: qty 100

## 2018-09-04 MED ORDER — ACETAMINOPHEN 500 MG PO TABS
1000.0000 mg | ORAL_TABLET | ORAL | Status: DC
  Administered 2018-09-04: 1000 mg via ORAL
  Filled 2018-09-04: qty 2

## 2018-09-04 MED ORDER — SODIUM CHLORIDE 0.9 % IV SOLN
INTRAVENOUS | Status: DC
  Administered 2018-09-04: 09:00:00 via INTRAVENOUS

## 2018-09-04 NOTE — Progress Notes (Signed)
Pt. Tolerated infusion well.   Aware of next appointment on 09/18/2018 at 0800

## 2018-09-06 ENCOUNTER — Ambulatory Visit: Payer: Medicare Other | Admitting: Psychiatry

## 2018-09-07 ENCOUNTER — Other Ambulatory Visit: Payer: Self-pay | Admitting: Internal Medicine

## 2018-09-07 DIAGNOSIS — R0609 Other forms of dyspnea: Principal | ICD-10-CM

## 2018-09-11 ENCOUNTER — Telehealth: Payer: Self-pay | Admitting: Physician Assistant

## 2018-09-11 ENCOUNTER — Other Ambulatory Visit: Payer: Self-pay

## 2018-09-11 MED ORDER — CLONAZEPAM 0.5 MG PO TABS: 0.2500 mg | ORAL_TABLET | Freq: Every day | ORAL | 0 refills | Status: DC

## 2018-09-11 NOTE — Telephone Encounter (Signed)
Patient called and said that she is out of klonopin 0.5 mg take .25mg  by mouth daily. [facility] [place]. Next appointment April 1st

## 2018-09-11 NOTE — Telephone Encounter (Signed)
rx called into pharmacy

## 2018-09-14 ENCOUNTER — Telehealth: Payer: Self-pay | Admitting: Physician Assistant

## 2018-09-14 ENCOUNTER — Other Ambulatory Visit: Payer: Self-pay

## 2018-09-14 MED ORDER — CITALOPRAM HYDROBROMIDE 20 MG PO TABS: 30.0000 mg | ORAL_TABLET | Freq: Every day | ORAL | 0 refills | Status: DC

## 2018-09-14 NOTE — Telephone Encounter (Signed)
Pt given information and verbalized understanding. Sent rx to [facility]

## 2018-09-14 NOTE — Telephone Encounter (Signed)
Patient called and said that she has gone of the respirodone and it was rough going  In the beginning but now she is fine. Even her tremors are gone. Wants to know what anti depressent she can get off next with instructions

## 2018-09-14 NOTE — Telephone Encounter (Signed)
She can decrease the Celexa to 30 mg total. Send in 20mg  #45 if needed, 1.5 pills daily.  No Rf

## 2018-09-14 NOTE — Telephone Encounter (Signed)
Left voice mail to call back for instructions

## 2018-09-17 ENCOUNTER — Ambulatory Visit (INDEPENDENT_AMBULATORY_CARE_PROVIDER_SITE_OTHER): Payer: Medicare Other

## 2018-09-17 ENCOUNTER — Other Ambulatory Visit: Payer: Self-pay

## 2018-09-17 ENCOUNTER — Encounter: Payer: Self-pay | Admitting: Internal Medicine

## 2018-09-17 ENCOUNTER — Ambulatory Visit: Payer: Medicare Other | Admitting: Internal Medicine

## 2018-09-17 VITALS — BP 146/90 | HR 54 | Temp 98.6°F | Ht 67.0 in | Wt 134.6 lb

## 2018-09-17 DIAGNOSIS — R0609 Other forms of dyspnea: Secondary | ICD-10-CM

## 2018-09-17 DIAGNOSIS — E871 Hypo-osmolality and hyponatremia: Secondary | ICD-10-CM

## 2018-09-17 DIAGNOSIS — J449 Chronic obstructive pulmonary disease, unspecified: Secondary | ICD-10-CM | POA: Diagnosis not present

## 2018-09-17 LAB — CBC WITH DIFFERENTIAL/PLATELET
Basophils Absolute: 0.1 10*3/uL (ref 0.0–0.1)
Basophils Relative: 0.8 % (ref 0.0–3.0)
Eosinophils Absolute: 0.1 10*3/uL (ref 0.0–0.7)
Eosinophils Relative: 0.9 % (ref 0.0–5.0)
HCT: 40.5 % (ref 36.0–46.0)
Hemoglobin: 14.2 g/dL (ref 12.0–15.0)
Lymphocytes Relative: 19.9 % (ref 12.0–46.0)
Lymphs Abs: 1.6 10*3/uL (ref 0.7–4.0)
MCHC: 35 g/dL (ref 30.0–36.0)
MCV: 95.3 fl (ref 78.0–100.0)
Monocytes Absolute: 0.7 10*3/uL (ref 0.1–1.0)
Monocytes Relative: 8.6 % (ref 3.0–12.0)
Neutro Abs: 5.6 10*3/uL (ref 1.4–7.7)
Neutrophils Relative %: 69.8 % (ref 43.0–77.0)
Platelets: 324 10*3/uL (ref 150.0–400.0)
RBC: 4.25 Mil/uL (ref 3.87–5.11)
RDW: 13.8 % (ref 11.5–15.5)
WBC: 8 10*3/uL (ref 4.0–10.5)

## 2018-09-17 LAB — BASIC METABOLIC PANEL
BUN: 11 mg/dL (ref 6–23)
CHLORIDE: 85 meq/L — AB (ref 96–112)
CO2: 29 mEq/L (ref 19–32)
Calcium: 9.3 mg/dL (ref 8.4–10.5)
Creatinine, Ser: 0.89 mg/dL (ref 0.40–1.20)
GFR: 64.18 mL/min (ref >60.00)
Glucose, Bld: 82 mg/dL (ref 70–99)
Potassium: 3.6 mEq/L (ref 3.5–5.1)
Sodium: 125 mEq/L — ABNORMAL LOW (ref 135–145)

## 2018-09-17 LAB — HEPATIC FUNCTION PANEL
ALT: 31 U/L (ref 0–35)
AST: 24 U/L (ref 0–37)
Albumin: 4.5 g/dL (ref 3.5–5.2)
Alkaline Phosphatase: 121 U/L — ABNORMAL HIGH (ref 39–117)
Bilirubin, Direct: 0.1 mg/dL (ref 0.0–0.3)
Total Bilirubin: 0.5 mg/dL (ref 0.2–1.2)
Total Protein: 7.3 g/dL (ref 6.0–8.3)

## 2018-09-17 LAB — SEDIMENTATION RATE: Sed Rate: 9 mm/hr (ref 0–30)

## 2018-09-17 LAB — BRAIN NATRIURETIC PEPTIDE: PRO B NATRI PEPTIDE: 75 pg/mL (ref 0.0–100.0)

## 2018-09-17 LAB — TSH: TSH: 2.11 u[IU]/mL (ref 0.35–4.50)

## 2018-09-17 NOTE — Patient Instructions (Addendum)
Ok to add clonepin 0.5 mg up to twice daily if you can't get comfortable with your breathing   Classic subdiaphragmatic pain pattern suggests ibs:  Stereotypical, migratory with a very limited distribution of pain locations, daytime, not usually exacerbated by exercise  or coughing, worse in sitting position, frequently associated with generalized abd bloating, not as likely to be present supine due to the dome effect of the diaphragm which  is  canceled in that position. Frequently these patients have had multiple negative GI workups and CT scans.  Treatment consists of avoiding foods that cause gas (especially boiled eggs, mexcican food but especially  beans and undercooked vegetables like  spinach and some salads)  and citrucel 1 heaping tsp twice daily with a large glass of water.  Pain should improve w/in 2 weeks and if not then consider further GI work up.      Please remember to go to the lab and x-ray department   for your tests - we will call you with the results when they are available.

## 2018-09-17 NOTE — Progress Notes (Signed)
Angel Maddox, female    DOB: [birth year],     MRN: [id number]    Brief patient profile:  63 yowf quit smoking 1992 with MS phenotype with ok alpha one levels and no  apparent resp sequelae from smoking with dx of Multiple sclerosis  late 90's with progressive doe x 2018 so referred to pulmonary clinic 07/03/2018 by Dr   Dellis Filbert    History of Present Illness  07/03/2018  Pulmonary/ 1st office eval/Jennye Runquist  Chief Complaint  Patient presents with  . Pulmonary Consult    Reffered by Dr Dellis Filbert. Pt c/o DOE for the past 1-2 years. She states she gets winded walking around her house and when she bends over.   Dyspnea:  Progressive x several years but esp last 6 months to point where doe = MMRC4  = sob if tries to leave home or while getting dressed   Cough: none Sleep: on side horizontally  SABA use: bid ? Helps some  rec Plan A = Automatic = symbicort 80 Take 2 puffs first thing in am and then another 2 puffs about 12 hours later.  Work on inhaler technique:     Pantoprazole (protonix) 40 mg   Take  30-60 min before first meal of the day and Pepcid (famotidine)  20 mg one after supper  Plan B = Backup Only use your albuterol inhaler as a rescue medication   GERD diet    08/02/2018  f/u ov/Adrijana Haros re:  ? COPD (could not do spirometry)  Chief Complaint  Patient presents with  . Follow-up    ? copd   Dyspnea:  Room to room doe but improved, using  rollator  Cough: no Sleeping: on side bed flat SABA use: on symb 80 not needing any saba  02: none Midline cp x months comes and goes happens sitting just as much as walking /  never wakes her up and no assoc nausea/ hb/ dysphagia/ seems more positional than pleuritic rec  Plan A = Automatic = Symbicort 80 Take 2 puffs first thing in am and then another 2 puffs about 12 hours later.  Work on inhaler technique:  Plan B = Backup Only use your albuterol inhaler as a rescue medication   GERD (REFLUX)  Diet  Please schedule a follow up office visit  in 6 weeks, call sooner if needed with all respiratory medications/devices /inhalers/ solutions in hand    09/17/2018 acute extended ov/Deundra Furber re: sob and chest tight / did not bring any meds but saba and it doesn't hlep Chief Complaint  Patient presents with  . Acute Visit    Increased chest congestion and chest tightness x 3 wks. She is coughing some but no sputum production. She is using her albuterol inhaler 2 x daily on average.   had improved p last ov until 3 weeks prior to OV  With onset  gen chest  tightness   and doe x 50 ft s 02  Sleep disrupted by cough which occurs sev hours p lie down / takes clonepin 0.5 mg one half and then sleeps fine  New RUQ/ R ant cp  x 3 days, last sev minutes then resolves , never supine / not pleruitic or exertional and No worse with meals, ? Assoc nausea Stopped resperidol one week prior to OV / also weaning  Clonazepam and " I don't want any more meds )  Numbness mouth and tongue  Dental work 2 weeks prior to [place] old filling  No obvious day to day or daytime variability or assoc excess/ purulent sputum or mucus plugs or hemoptysis or , subjective wheeze or overt sinus or hb symptoms.     Also denies any obvious fluctuation of symptoms with weather or environmental changes or other aggravating or alleviating factors except as outlined above   No unusual exposure hx or h/o childhood pna/ asthma or knowledge of premature birth.  Current Allergies, Complete Past Medical History, Past Surgical History, Family History, and Social History were reviewed in [employer] record.  ROS  The following are not active complaints unless bolded Hoarseness, sore throat, dysphagia, dental problems, itching, sneezing,  nasal congestion or discharge of excess mucus or purulent secretions, ear ache,   fever, chills, sweats, unintended wt loss or wt gain, classically pleuritic or exertional cp,  orthopnea pnd or arm/hand swelling  or leg  swelling, presyncope, palpitations, abdominal pain, anorexia, nausea, vomiting, diarrhea  or change in bowel habits or change in bladder habits, change in stools or change in urine, dysuria, hematuria,  rash, arthralgias, visual complaints, headache, numbness, weakness or ataxia or problems with walking or coordination,  change in mood or  memory.        Current Meds - - NOTE:   Unable to verify as accurately reflecting what pt takes     Medication Sig  . albuterol (PROVENTIL HFA;VENTOLIN HFA) 108 (90 Base) MCG/ACT inhaler Inhale 2 puffs into the lungs every 4 (four) hours as needed for wheezing or shortness of breath.  . budesonide-formoterol (SYMBICORT) 80-4.5 MCG/ACT inhaler Take 2 puffs first thing in am and then another 2 puffs about 12 hours later.  Marland Kitchen buPROPion (WELLBUTRIN SR) 200 MG 12 hr tablet Take 1 tablet (200 mg total) by mouth 2 (two) times daily.  . calcium carbonate (OSCAL) 1500 (600 Ca) MG TABS tablet Take 1,500 mg by mouth daily with breakfast.  . citalopram (CELEXA) 20 MG tablet Take 1.5 tablets (30 mg total) by mouth daily.  . clonazePAM (KLONOPIN) 0.5 MG tablet Take 0.5 tablets (0.25 mg total) by mouth at bedtime. Takes half a tablet  . famotidine (PEPCID) 20 MG tablet One at bedtime  . gabapentin (NEURONTIN) 800 MG tablet Take 400 mg by mouth 3 (three) times daily. Takes half tablet  . hydrochlorothiazide (HYDRODIURIL) 25 MG tablet Take 0.5 tablets (12.5 mg total) by mouth daily.  Marland Kitchen ibuprofen (ADVIL,MOTRIN) 200 MG tablet Take 800 mg by mouth every 8 (eight) hours as needed for moderate pain.  . mirtazapine (REMERON) 15 MG tablet Take 0.5-1 tablets (7.5-15 mg total) by mouth at bedtime as needed.  . modafinil (PROVIGIL) 200 MG tablet Take 200 mg by mouth daily.  . Multiple Vitamins-Minerals (WOMENS DAILY FORMULA PO) Take 1 tablet by mouth every evening.   Wess Botts (OCREVUS IV) Inject 600 mg into the vein every 6 (six) months. Then 600 mg every 6 months. Next infusion due  09/2017  . Oxycodone HCl 10 MG TABS Take 1 tablet (10 mg total) by mouth every 6 (six) hours as needed ((score 7 to 10)).  Marland Kitchen pantoprazole (PROTONIX) 40 MG tablet TAKE 1 TABLET (40 MG TOTAL) BY MOUTH DAILY. TAKE 30-60 MIN BEFORE FIRST MEAL OF THE DAY  . primidone (MYSOLINE) 250 MG tablet Take 1 tablet (250 mg total) by mouth every 8 (eight) hours.  Marland Kitchen Spacer/Aero-Holding Chambers DEVI 1 Units by Does not apply route 4 (four) times daily as needed.  . tizanidine (ZANAFLEX) 2 MG capsule Take 2 mg by  mouth daily.   . valACYclovir (VALTREX) 1000 MG tablet TAKE 1 TABLET 2 TIMES DAILY. THEN STAY ON ONE TABLET DAILY AS PREVENTION (Patient taking differently: daily. )            Objective:     amb wf wearing facemask and clearly holding her breath at full insp then gasping as she lets it out.   09/17/2018       134   08/02/18 136 lb 3.2 oz (61.8 kg)  07/10/18 136 lb (61.7 kg)  07/03/18 136 lb (61.7 kg)      Vital signs reviewed - Note on arrival 02 sats  97% on RA      HEENT: nl dentition, turbinates bilaterally, and oropharynx. Nl external ear canals without cough reflex   NECK :  without JVD/Nodes/TM/ nl carotid upstrokes bilaterally   LUNGS: no acc muscle use,  Nl contour chest which is clear to A and P bilaterally without cough on insp or exp maneuvers   CV:  RRR  no s3 or murmur or increase in P2, and no edema   ABD:  soft with minimal tenderness diffuse RUQ s def murphy's sign  with nl inspiratory excursion in the supine position. No bruits or organomegaly appreciated, bowel sounds nl  MS:  Nl gait/ ext warm without deformities, calf tenderness, cyanosis or clubbing No obvious joint restrictions   SKIN: warm and dry without lesions    NEURO:  alert, approp, nl sensorium with  no motor or cerebellar deficits apparent.     CXR PA and Lateral:   09/17/2018 :    I personally reviewed images and agree with radiology impression as follows:    Emphysematous changes without  infiltrate.   Labs ordered/ reviewed:      Chemistry      Component Value Date/Time   NA 125 (L) 09/17/2018 1446   NA 130 (L) 05/01/2017 0836   K 3.6 09/17/2018 1446   CL 85 (L) 09/17/2018 1446   CO2 29 09/17/2018 1446   BUN 11 09/17/2018 1446   BUN 9 05/01/2017 0836   CREATININE 0.89 09/17/2018 1446      Component Value Date/Time   CALCIUM 9.3 09/17/2018 1446   ALKPHOS 121 (H) 09/17/2018 1446   AST 24 09/17/2018 1446   ALT 31 09/17/2018 1446   BILITOT 0.5 09/17/2018 1446   BILITOT 0.3 05/01/2017 0836        Lab Results  Component Value Date   WBC 8.0 09/17/2018   HGB 14.2 09/17/2018   HCT 40.5 09/17/2018   MCV 95.3 09/17/2018   PLT 324.0 09/17/2018         Lab Results  Component Value Date   TSH 2.11 09/17/2018     Lab Results  Component Value Date   PROBNP 75.0 09/17/2018       Lab Results  Component Value Date   ESRSEDRATE 9 09/17/2018                          Assessment

## 2018-09-18 ENCOUNTER — Encounter (HOSPITAL_COMMUNITY)
Admission: RE | Admit: 2018-09-18 | Discharge: 2018-09-18 | Disposition: A | Discharge status: Home / Self Care | Payer: Medicare Other | Source: Ambulatory Visit | Attending: Psychiatry | Admitting: Psychiatry

## 2018-09-18 ENCOUNTER — Telehealth: Payer: Self-pay | Admitting: Physician Assistant

## 2018-09-18 ENCOUNTER — Encounter: Payer: Self-pay | Admitting: Internal Medicine

## 2018-09-18 ENCOUNTER — Telehealth: Payer: Self-pay | Admitting: Internal Medicine

## 2018-09-18 ENCOUNTER — Encounter (HOSPITAL_COMMUNITY): Payer: Self-pay

## 2018-09-18 DIAGNOSIS — G35 Multiple sclerosis: Secondary | ICD-10-CM | POA: Diagnosis not present

## 2018-09-18 MED ORDER — METHYLPREDNISOLONE SODIUM SUCC 125 MG IJ SOLR
125.0000 mg | INTRAMUSCULAR | Status: DC
  Administered 2018-09-18: 125 mg via INTRAVENOUS
  Filled 2018-09-18: qty 2

## 2018-09-18 MED ORDER — DIPHENHYDRAMINE HCL 50 MG/ML IJ SOLN
25.0000 mg | INTRAMUSCULAR | Status: DC
  Administered 2018-09-18: 25 mg via INTRAVENOUS
  Filled 2018-09-18: qty 1

## 2018-09-18 MED ORDER — SODIUM CHLORIDE 0.9 % IV SOLN
1000.0000 mg | INTRAVENOUS | Status: DC
  Administered 2018-09-18: 1000 mg via INTRAVENOUS
  Filled 2018-09-18: qty 100

## 2018-09-18 MED ORDER — SODIUM CHLORIDE 0.9 % IV SOLN
Freq: Once | INTRAVENOUS | Status: AC
  Administered 2018-09-18: 09:00:00 via INTRAVENOUS

## 2018-09-18 MED ORDER — ACETAMINOPHEN 500 MG PO TABS
1000.0000 mg | ORAL_TABLET | ORAL | Status: DC
  Administered 2018-09-18: 1000 mg via ORAL
  Filled 2018-09-18: qty 2

## 2018-09-18 NOTE — Progress Notes (Signed)
LMTCB

## 2018-09-18 NOTE — Assessment & Plan Note (Addendum)
Onset around 2018  - unable to perform pfts 07/03/18 - 09/17/2018   Walked RA x one lap =  approx 250 ft - stopped due to  Sob with sats high 90's and obvious breath holding then gasping expiratory efforts   Symptoms are markedly disproportionate to objective findings and not clear to what extent this is actually a pulmonary  problem but pt does appear to have difficult to sort out respiratory symptoms of unknown origin for which  DDX  = almost all start with A and  include Adherence, Ace Inhibitors, Acid Reflux, Active Sinus Disease, Alpha 1 Antitripsin deficiency, Anxiety masquerading as Airways dz,  ABPA,  Allergy(esp in young), Aspiration (esp in elderly), Adverse effects of meds,  Active smoking or Vaping, A bunch of PE's/clot burden (a few small clots can't cause this syndrome unless there is already severe underlying pulm or vascular dz with poor reserve),  Anemia or thyroid disorder, plus two Bs  = Bronchiectasis and Beta blocker use..and one C= CHF     Adherence is always the initial "prime suspect" and is a multilayered concern that requires a "trust but verify" approach in every patient - starting with knowing how to use medications, especially inhalers, correctly, keeping up with refills and understanding the fundamental difference between maintenance and prns vs those medications only taken for a very short course and then stopped and not refilled.  - see hfa teaching - advised now for the 3rd time to return to all ov's with all meds in hand using a trust but verify approach to confirm accurate Medication  Reconciliation The principal here is that until we are certain that the  patients are doing what we've asked, it makes no sense to ask them to do more.   ? Asthma/ allergy > see ? Copd/ hfa teaching - it's possible her chest tightness is related to copd but I've never seen a pt with copd intentionally breath hold as a compensating mech and rec she do purse lip instead.  ? Anxiety > usually  at the bottom of this list of usual suspects but should be near top of this pt's based on H and P and note already on psychotropics and may interfere with adherence and also interpretation of response or lack thereof to symptom management which can be quite subjective.  >>>  Try clonazepam 0.5 mg bid on short term basis to see to what extent it alleviates her symptoms and if so no need for pulmonary f/u  ? Acid (or non-acid) GERD > always difficult to exclude as up to 75% of pts in some series report no assoc GI/ Heartburn symptoms> rec continue max (24h)  acid suppression and diet restrictions/ reviewed    - also added IBS diet empirically    ? Alpha one AT def > ruled out   ? Anemia/ thyroid dz > excluded today   ? chf > also excluded with such a low bnp

## 2018-09-18 NOTE — Assessment & Plan Note (Addendum)
First detected 07/04/2018 > advised water restriction - Na down to 125 09/17/2018  > rec w/u    Strongly doubt siadh from pulmonary source and most likely related to pysch meds or psychogenic polydipsia but continues trend down so will need w/u by endocrine or renal > defer to PCP   Pt advised to reduce all fluid intake as much as possible in meantime     I had an extended discussion with the patient   reviewing all relevant studies completed to date and  lasting 25  minutes of a 40 minute visit  which included directly observing portions of  ambulatory 02 saturation study documented in a/p section of  today's  office note and   device teaching which extended face to face time for this visit   Each maintenance medication was reviewed in detail including most importantly the difference between maintenance and prns and under what circumstances the prns are to be triggered using an action plan format that is not reflected in the computer generated alphabetically organized AVS.     Please see AVS for specific instructions unique to this visit that I personally wrote and verbalized to the the pt in detail and then reviewed with pt  by my nurse highlighting any changes in therapy recommended at today's visit .

## 2018-09-18 NOTE — Telephone Encounter (Signed)
Appointment given for Friday with Glenard Haring for a virtual visit.

## 2018-09-18 NOTE — Telephone Encounter (Signed)
Notes recorded by Tanda Rockers, MD on 09/18/2018 at 7:07 AM EDT Call Angel Maddox : Studies are ok x Na trending down c/w too much fluid intake or possible side effects of meds (psych meds) > defer to PCP whether to refer to Endocrine or Renal and I sent the results to her with her ov notes but in meantime should be using hard rock candy (no mint or menthol or chocolate) instead of liquids if mouth is dry and cut way back on intake of all liquids  Notes recorded by Tanda Rockers, MD on 09/18/2018 at 7:08 AM EDT Call pt: Reviewed cxr and no acute change - it does not show significant copd in my opinion but since she can' perform a pft it's hard to be 100% sure and I welcome a second opinion if she desires but needs to wait until p corona issues resolve.   ___________________________________  Angel Maddox and spoke with Angel Maddox advised her of both results above. Angel Maddox advised and verbalized understanding. Nothing further needed.

## 2018-09-18 NOTE — Assessment & Plan Note (Signed)
Quit smoking 1992 with onset doe 2018 in setting of MS/ anxiety  - Spirometry 07/03/2018  Not physiologic - 07/03/2018   Walked RA x 10 ft - stopped due to sob and sats 89%   - 07/03/2018 alpha one screen   MS  Level 111  - 08/02/2018   symbicrot 80 2bid  - 09/17/2018  After extensive coaching inhaler device,  effectiveness =    75% from a baseline of around 25 % > continue symbicort/ consider trial of spiriva next step though I strongly doubt much copd here based on  When respiratory symptoms begin or become refractory well after a patient reports complete smoking cessation,  Especially when this wasn't the case while they were smoking, a red flag is raised based on the work of Dr Kris Mouton which states:  if you quit smoking when your best day FEV1 is still well preserved it is highly unlikely you will progress to severe disease.  That is to say, once the smoking stops,  the symptoms should not suddenly erupt or markedly worsen.  If so, the differential diagnosis should include  obesity/deconditioning,  LPR/Reflux/Aspiration syndromes,  occult CHF, or  especially side effect of medications commonly used in this population.

## 2018-09-19 ENCOUNTER — Ambulatory Visit: Payer: Medicare Other | Admitting: Psychiatry

## 2018-09-19 ENCOUNTER — Ambulatory Visit: Payer: Self-pay | Admitting: Internal Medicine

## 2018-09-20 ENCOUNTER — Ambulatory Visit (INDEPENDENT_AMBULATORY_CARE_PROVIDER_SITE_OTHER): Payer: Medicare Other | Admitting: Internal Medicine

## 2018-09-20 ENCOUNTER — Other Ambulatory Visit: Payer: Self-pay

## 2018-09-20 DIAGNOSIS — R0602 Shortness of breath: Secondary | ICD-10-CM | POA: Diagnosis not present

## 2018-09-20 DIAGNOSIS — J449 Chronic obstructive pulmonary disease, unspecified: Secondary | ICD-10-CM

## 2018-09-20 DIAGNOSIS — F419 Anxiety disorder, unspecified: Secondary | ICD-10-CM

## 2018-09-20 MED ORDER — GLYCOPYRROLATE-FORMOTEROL 9-4.8 MCG/ACT IN AERO: 2.0000 | INHALATION_SPRAY | Freq: Two times a day (BID) | RESPIRATORY_TRACT | 11 refills | Status: DC

## 2018-09-20 NOTE — Progress Notes (Signed)
Spoke with pt and notified of results per Dr. Wert. Pt verbalized understanding and denied any questions. 

## 2018-09-20 NOTE — Progress Notes (Signed)
Virtual Visit via Telephone Note  I connected with Angel Maddox on 09/20/18 at 11:45 AM EDT by telephone and verified that I am speaking with the correct person using two identifiers.   I discussed the limitations, risks, security and privacy concerns of performing an evaluation and management service by telephone and the availability of in person appointments. I also discussed with the patient that there may be a patient responsible charge related to this service. The patient expressed understanding and agreed to proceed.   History of Present Illness:  Angel Maddox Onset around 2018  - CTa chest 06/05/18  increased bronchial thickening s emphysema - unable to perform pfts 07/03/18 - 09/17/2018   Walked RA x one lap =  approx 250 ft - stopped due to  Sob with sats high 90's and obvious breath holding then gasping expiratory efforts   COPD ? Severity  Quit smoking 1992 with onset Angel Maddox 2018 in setting of MS/ anxiety  - CTa chest 06/05/18  increased bronchial thickening s emphysema - Spirometry 07/03/2018  Not physiologic - 07/03/2018   Walked RA x 10 ft - stopped due to sob and sats 89%   - 07/03/2018 alpha one screen   MS  Level 111  - 08/02/2018   symbicort 80 2bid  - 09/17/2018  After extensive coaching inhaler device,  effectiveness =    75% from a baseline of around 25 % > continue symbicort/ consider trial of spiriva next step      Ov  09/17/2018  Ok to add clonepin 0.5 mg up to twice daily if you can't get comfortable with your breathing  Classic subdiaphragmatic pain pattern suggests ibs:  Stereotypical, migratory with a very limited distribution of pain locations, daytime, not usually exacerbated by exercise  or coughing, worse in sitting position, frequently associated with generalized abd bloating, not as likely to be present supine due to the dome effect of the diaphragm which  is  canceled in that position. Frequently these patients have had multiple negative GI workups and CT scans. Treatment  consists of avoiding foods that cause gas (especially boiled eggs, mexcican food but especially  beans and undercooked vegetables like  spinach and some salads)  and citrucel 1 heaping tsp twice daily with a large glass of water.  Pain should improve w/in 2 weeks and if not then consider further GI work up.    Please remember to go to the lab and x-ray department   for your tests - we will call you with the results when they are available.   09/20/2018  Televisit/ no charge/  No change in symptoms  - continues to gasp at rest and min activity    Dyspnea:  Still gasping but able to speak in full sentenences during interview Cough: none Sleeping: flat fine a clonazepam 0.5 mg at hs SABA use: bid ? Helps some (prev reported nothing helped, now says symbicort and saba both help  02: none          Observations/Objective: Speaking in full sentences, no gasping appreciated   Labs ordered/ reviewed:      Chemistry      Component Value Date/Time   NA 125 (L) 09/17/2018 1446   NA 130 (L) 05/01/2017 0836   K 3.6 09/17/2018 1446   CL 85 (L) 09/17/2018 1446   CO2 29 09/17/2018 1446   BUN 11 09/17/2018 1446   BUN 9 05/01/2017 0836   CREATININE 0.89 09/17/2018 1446      Component Value  Date/Time   CALCIUM 9.3 09/17/2018 1446   ALKPHOS 121 (H) 09/17/2018 1446   AST 24 09/17/2018 1446   ALT 31 09/17/2018 1446   BILITOT 0.5 09/17/2018 1446   BILITOT 0.3 05/01/2017 0836        Lab Results  Component Value Date   WBC 8.0 09/17/2018   HGB 14.2 09/17/2018   HCT 40.5 09/17/2018   MCV 95.3 09/17/2018   PLT 324.0 09/17/2018       EOS                                                              0.1                                     09/20/2018      Lab Results  Component Value Date   TSH 2.11 09/17/2018     Lab Results  Component Value Date   PROBNP 75.0 09/17/2018       Lab Results  Component Value Date   ESRSEDRATE 9 09/17/2018        CXR PA and Lateral:  09/17/18   I personally reviewed images and agree with radiology impression as follows:   slt hyperinflation but no emphysema on most recent ct chest.   Assessment and Plan:  1)  COPD ? Severity > I  Think it's very minimal but perhaps air trapping contributing to symptoms so try bevespi 2 bid instead of symbicort  2) Anxiety/depression driving most of his symptoms > again advised re trial of low dose clonazepam 0.5 mg bid advised for a few days - see avs for instructions unique to this ov     Follow Up Instructions:    I discussed the assessment and treatment plan with the patient. The patient was provided an opportunity to ask questions and all were answered. The patient agreed with the plan and demonstrated an understanding of the instructions.   The patient was advised to call back or seek an in-person evaluation if the symptoms worsen or if the condition fails to improve as anticipated.      Christinia Gully, MD

## 2018-09-20 NOTE — Patient Instructions (Signed)
Stop symbicort and start Bevespi Take 2 puffs first thing in am and then another 2 puffs about 12 hours later.   Try taking a whole clonazepam 0.5 mg twice daily for several days in a row to see if corrects your breathing problem while on it and if so this is nerve related for sure.   See Particia Nearing to follow up your sodium disorder  Which may be in part related to your medications or drinking too much fluid.

## 2018-09-21 ENCOUNTER — Telehealth (INDEPENDENT_AMBULATORY_CARE_PROVIDER_SITE_OTHER): Payer: Medicare Other | Admitting: Physician Assistant

## 2018-09-21 DIAGNOSIS — R5383 Other fatigue: Secondary | ICD-10-CM

## 2018-09-21 DIAGNOSIS — E278 Other specified disorders of adrenal gland: Secondary | ICD-10-CM

## 2018-09-21 DIAGNOSIS — E871 Hypo-osmolality and hyponatremia: Secondary | ICD-10-CM | POA: Diagnosis not present

## 2018-09-21 DIAGNOSIS — R0609 Other forms of dyspnea: Secondary | ICD-10-CM

## 2018-09-21 NOTE — Progress Notes (Signed)
Telephone visit  Subjective: CC: Hyponatremia, dyspnea, fatigue PCP: Terald Sleeper, PA-C UYQ:[id number] C Tetro is a 63 y.o. female calls for telephone consult today. Patient provides verbal consent for consult held via phone. Due to COVID 19 we are performing phone virtual calls.  Location of patient: home Location of provider: [facility] Others present for call: none  This patient has been seen through pulmonology for worsening shortness of breath despite standard medications.  While she was there she had labs performed that showed hyponatremia.  In reviewing her chart she had occasion low sodium, fall 2019 normal had this and findings.  Several weeks she has had increasing shortness of breath, fatigue, definite dyspnea on exertion.  The patient does have multiple sclerosis so she does not walk and move very quickly at all, however she tires out with the least bit of walking at this point. We are planning endocrinology referral as soon as possible.  Instructed patient to come for labs in out office at her convenience. Restrict water Increase salt   Ref Range & Units 4d ago (09/17/18) 35mo ago (07/03/18) 58mo ago (06/05/18) 1yr ago (07/24/17) 29yr ago (05/01/17)  Sodium 135 - 145 mEq/L 125Low   127Low    126Low  R 130Low  R  Potassium 3.5 - 5.1 mEq/L 3.6  4.0   3.8 R 3.5 R  Chloride 96 - 112 mEq/L 85Low   92Low    89Low  R 85Low  R  CO2 19 - 32 mEq/L 29  26   26  R 28 R  Glucose, Bld 70 - 99 mg/dL 82  89   82 R 86 R  BUN 6 - 23 mg/dL 11  7   9  R 9 R  Creatinine, Ser 0.40 - 1.20 mg/dL 0.89  0.82  0.90 R 0.80 R 0.71 R  Calcium 8.4 - 10.5 mg/dL 9.3  9.3   9.2 R 9.4 R  GFR >60.00 mL/min 64.18  75.02       05/2018 CT: Adrenals/Urinary Tract: A 2.2 x 2.8 cm RIGHT adrenal mass is exhibits washout on delayed images compatible with an adenoma.  ROS: Per HPI  Allergies  Allergen Reactions  . Hydrocodone Itching   Past Medical History:  Diagnosis Date  . Anxiety   . Arthritis    osteo  .  Bipolar 1 disorder ([place])   . Cervicalgia   . Chronic kidney disease    kidney stone  . COPD (chronic obstructive pulmonary disease) ([place])    new diagnosis- now sees pulmonologist  . Depression   . Elevated liver enzymes    She says "normal now"  . GERD (gastroesophageal reflux disease)   . H/O hiatal hernia   . Headache   . Hip fracture ([place]) 2017   left  . History of kidney stones    4-5 yrs ago  . Hypertension   . Multiple sclerosis ([place])     Had for 15 years  . Multiple sclerosis ([place])    dx 1999  . Tremors of nervous system     Current Outpatient Medications:  .  albuterol (PROVENTIL HFA;VENTOLIN HFA) 108 (90 Base) MCG/ACT inhaler, Inhale 2 puffs into the lungs every 4 (four) hours as needed for wheezing or shortness of breath., Disp: 1 Inhaler, Rfl: 1 .  buPROPion (WELLBUTRIN SR) 200 MG 12 hr tablet, Take 1 tablet (200 mg total) by mouth 2 (two) times daily., Disp: 60 tablet, Rfl: 1 .  calcium carbonate (OSCAL) 1500 (600 Ca) MG TABS  tablet, Take 1,500 mg by mouth daily with breakfast., Disp: , Rfl:  .  citalopram (CELEXA) 20 MG tablet, Take 1.5 tablets (30 mg total) by mouth daily., Disp: 45 tablet, Rfl: 0 .  clonazePAM (KLONOPIN) 0.5 MG tablet, Take 0.5 tablets (0.25 mg total) by mouth at bedtime. Takes half a tablet, Disp: 30 tablet, Rfl: 0 .  famotidine (PEPCID) 20 MG tablet, One at bedtime, Disp: 30 tablet, Rfl: 11 .  gabapentin (NEURONTIN) 800 MG tablet, Take 400 mg by mouth 3 (three) times daily. Takes half tablet, Disp: , Rfl:  .  Glycopyrrolate-Formoterol (BEVESPI AEROSPHERE) 9-4.8 MCG/ACT AERO, Inhale 2 puffs into the lungs 2 (two) times daily., Disp: 1 Inhaler, Rfl: 11 .  hydrochlorothiazide (HYDRODIURIL) 25 MG tablet, Take 0.5 tablets (12.5 mg total) by mouth daily., Disp: 90 tablet, Rfl: 3 .  ibuprofen (ADVIL,MOTRIN) 200 MG tablet, Take 800 mg by mouth every 8 (eight) hours as needed for moderate pain., Disp: , Rfl:  .  mirtazapine (REMERON) 15 MG tablet, Take  0.5-1 tablets (7.5-15 mg total) by mouth at bedtime as needed., Disp: 30 tablet, Rfl: 1 .  modafinil (PROVIGIL) 200 MG tablet, Take 200 mg by mouth daily., Disp: , Rfl: 5 .  Multiple Vitamins-Minerals (WOMENS DAILY FORMULA PO), Take 1 tablet by mouth every evening. , Disp: , Rfl:  .  Ocrelizumab (OCREVUS IV), Inject 600 mg into the vein every 6 (six) months. Then 600 mg every 6 months. Next infusion due 09/2017, Disp: , Rfl:  .  Oxycodone HCl 10 MG TABS, Take 1 tablet (10 mg total) by mouth every 6 (six) hours as needed ((score 7 to 10))., Disp: 120 tablet, Rfl: 0 .  pantoprazole (PROTONIX) 40 MG tablet, TAKE 1 TABLET (40 MG TOTAL) BY MOUTH DAILY. TAKE 30-60 MIN BEFORE FIRST MEAL OF THE DAY, Disp: 30 tablet, Rfl: 2 .  primidone (MYSOLINE) 250 MG tablet, Take 1 tablet (250 mg total) by mouth every 8 (eight) hours., Disp: 90 tablet, Rfl: 0 .  Spacer/Aero-Holding Chambers DEVI, 1 Units by Does not apply route 4 (four) times daily as needed., Disp: 1 each, Rfl: 2 .  tizanidine (ZANAFLEX) 2 MG capsule, Take 2 mg by mouth daily. , Disp: , Rfl:  .  valACYclovir (VALTREX) 1000 MG tablet, TAKE 1 TABLET 2 TIMES DAILY. THEN STAY ON ONE TABLET DAILY AS PREVENTION (Patient taking differently: daily. ), Disp: 40 tablet, Rfl: 5  Assessment/ Plan: 63 y.o. female   1. Hyponatremia syndrome endocrine appointment as soon as possible  2. DOE (dyspnea on exertion) Continue with pulmonology  3. Fatigue, unspecified type endocrine  4. Adrenal mass, right ([place]) Following   Start time: 11:27 End time: 11:42  No orders of the defined types were placed in this encounter.   Particia Nearing PA-C [place] [phone]

## 2018-09-21 NOTE — Progress Notes (Signed)
Referral placed.

## 2018-09-26 ENCOUNTER — Other Ambulatory Visit: Payer: Self-pay

## 2018-09-26 ENCOUNTER — Other Ambulatory Visit: Payer: Medicare Other

## 2018-09-26 ENCOUNTER — Other Ambulatory Visit: Payer: Self-pay | Admitting: Physician Assistant

## 2018-09-26 ENCOUNTER — Encounter: Payer: Self-pay | Admitting: Physician Assistant

## 2018-09-26 ENCOUNTER — Ambulatory Visit (INDEPENDENT_AMBULATORY_CARE_PROVIDER_SITE_OTHER): Payer: Medicare Other | Admitting: Physician Assistant

## 2018-09-26 DIAGNOSIS — R7989 Other specified abnormal findings of blood chemistry: Secondary | ICD-10-CM

## 2018-09-26 DIAGNOSIS — F411 Generalized anxiety disorder: Secondary | ICD-10-CM | POA: Diagnosis not present

## 2018-09-26 DIAGNOSIS — D72829 Elevated white blood cell count, unspecified: Secondary | ICD-10-CM

## 2018-09-26 DIAGNOSIS — G35 Multiple sclerosis: Secondary | ICD-10-CM | POA: Diagnosis not present

## 2018-09-26 DIAGNOSIS — G4700 Insomnia, unspecified: Secondary | ICD-10-CM

## 2018-09-26 DIAGNOSIS — F331 Major depressive disorder, recurrent, moderate: Secondary | ICD-10-CM | POA: Diagnosis not present

## 2018-09-26 DIAGNOSIS — E871 Hypo-osmolality and hyponatremia: Secondary | ICD-10-CM

## 2018-09-26 DIAGNOSIS — R945 Abnormal results of liver function studies: Secondary | ICD-10-CM

## 2018-09-26 DIAGNOSIS — E876 Hypokalemia: Secondary | ICD-10-CM

## 2018-09-26 DIAGNOSIS — I10 Essential (primary) hypertension: Secondary | ICD-10-CM

## 2018-09-26 LAB — CBC WITH DIFFERENTIAL/PLATELET
Basophils Absolute: 0.1 10*3/uL (ref 0.0–0.2)
Basos: 1 %
EOS (ABSOLUTE): 0.2 10*3/uL (ref 0.0–0.4)
Eos: 3 %
Hematocrit: 39.3 % (ref 34.0–46.6)
Hemoglobin: 13.4 g/dL (ref 11.1–15.9)
Immature Grans (Abs): 0 10*3/uL (ref 0.0–0.1)
Immature Granulocytes: 0 %
Lymphocytes Absolute: 1.9 10*3/uL (ref 0.7–3.1)
Lymphs: 38 %
MCH: 32.2 pg (ref 26.6–33.0)
MCHC: 34.1 g/dL (ref 31.5–35.7)
MCV: 95 fL (ref 79–97)
Monocytes Absolute: 0.5 10*3/uL (ref 0.1–0.9)
Monocytes: 11 %
Neutrophils Absolute: 2.3 10*3/uL (ref 1.4–7.0)
Neutrophils: 47 %
Platelets: 271 10*3/uL (ref 150–450)
RBC: 4.16 x10E6/uL (ref 3.77–5.28)
RDW: 13.6 % (ref 11.7–15.4)
WBC: 5 10*3/uL (ref 3.4–10.8)

## 2018-09-26 LAB — CMP14+EGFR
ALT: 51 IU/L — ABNORMAL HIGH (ref 0–32)
AST: 50 IU/L — ABNORMAL HIGH (ref 0–40)
Albumin/Globulin Ratio: 2 (ref 1.2–2.2)
Albumin: 4.6 g/dL (ref 3.8–4.8)
Alkaline Phosphatase: 126 IU/L — ABNORMAL HIGH (ref 39–117)
BUN/Creatinine Ratio: 14 (ref 12–28)
BUN: 13 mg/dL (ref 8–27)
Bilirubin Total: 0.3 mg/dL (ref 0.0–1.2)
CO2: 25 mmol/L (ref 20–29)
Calcium: 9.2 mg/dL (ref 8.7–10.3)
Chloride: 89 mmol/L — ABNORMAL LOW (ref 96–106)
Creatinine, Ser: 0.91 mg/dL (ref 0.57–1.00)
GFR calc Af Amer: 78 mL/min/{1.73_m2} (ref >59)
GFR calc non Af Amer: 68 mL/min/{1.73_m2} (ref >59)
Globulin, Total: 2.3 g/dL (ref 1.5–4.5)
Glucose: 79 mg/dL (ref 65–99)
Potassium: 4 mmol/L (ref 3.5–5.2)
Sodium: 131 mmol/L — ABNORMAL LOW (ref 134–144)
Total Protein: 6.9 g/dL (ref 6.0–8.5)

## 2018-09-26 NOTE — Progress Notes (Signed)
[facility] Med Check  Patient ID: Angel Maddox,  MRN: [id number]  PCP: Terald Sleeper, PA-C  Date of Evaluation: 09/26/2018 Time spent:15 minutes  Chief Complaint:  Chief Complaint    Follow-up     Virtual Visit via Telephone Note  I connected with Angel Maddox on 09/26/18 at  1:30 PM EDT by telephone and verified that I am speaking with the correct person using two identifiers.   I discussed the limitations, risks, security and privacy concerns of performing an evaluation and management service by telephone and the availability of in person appointments. I also discussed with the patient that there may be a patient responsible charge related to this service. The patient expressed understanding and agreed to proceed.   HISTORY/CURRENT STATUS: HPI For routine med check.  Tremor is completely gone!  Going off the Risperdal has been a good thing.  She still wants to decrease some of her pills though, and we went down on the Celexa last week.  She's doing okay so far.  No side effects.  She still really wants to get off the Celexa at some point.  She is happy with the Wellbutrin dose as it is right now.  She still has anxiety and mostly in the evening.  She does take 1 Klonopin around bedtime, which is helpful.  Her husband dispenses her medication for her.  She has been sleeping well since we added mirtazapine in at the last visit about 6 weeks ago.  Patient denies loss of interest in usual activities and is able to enjoy things.  Denies decreased energy or motivation.  Appetite has not changed.  No extreme sadness, tearfulness, or feelings of hopelessness.  Denies any changes in concentration, making decisions or remembering things.  Denies suicidal or homicidal thoughts.  Patient denies increased energy with decreased need for sleep, no increased talkativeness, no racing thoughts, no impulsivity or risky behaviors, no increased spending, no increased libido, no  grandiosity.  Denies dizziness, syncope, seizures, numbness, tingling, tremor, tics, slurred speech, confusion. Has unsteady gait from MS.   Individual Medical History/ Review of Systems: Changes? :No   Allergies: Hydrocodone  Current Medications:  Current Outpatient Medications:  .  albuterol (PROVENTIL HFA;VENTOLIN HFA) 108 (90 Base) MCG/ACT inhaler, Inhale 2 puffs into the lungs every 4 (four) hours as needed for wheezing or shortness of breath., Disp: 1 Inhaler, Rfl: 1 .  buPROPion (WELLBUTRIN SR) 200 MG 12 hr tablet, Take 1 tablet (200 mg total) by mouth 2 (two) times daily., Disp: 60 tablet, Rfl: 1 .  citalopram (CELEXA) 20 MG tablet, Take 1.5 tablets (30 mg total) by mouth daily., Disp: 45 tablet, Rfl: 0 .  clonazePAM (KLONOPIN) 0.5 MG tablet, Take 0.5 tablets (0.25 mg total) by mouth at bedtime. Takes half a tablet, Disp: 30 tablet, Rfl: 0 .  famotidine (PEPCID) 20 MG tablet, One at bedtime, Disp: 30 tablet, Rfl: 11 .  gabapentin (NEURONTIN) 800 MG tablet, Take 400 mg by mouth 3 (three) times daily. Takes half tablet, Disp: , Rfl:  .  hydrochlorothiazide (HYDRODIURIL) 25 MG tablet, Take 0.5 tablets (12.5 mg total) by mouth daily., Disp: 90 tablet, Rfl: 3 .  ibuprofen (ADVIL,MOTRIN) 200 MG tablet, Take 800 mg by mouth every 8 (eight) hours as needed for moderate pain., Disp: , Rfl:  .  mirtazapine (REMERON) 15 MG tablet, Take 0.5-1 tablets (7.5-15 mg total) by mouth at bedtime as needed., Disp: 30 tablet, Rfl: 1 .  Multiple Vitamins-Minerals (WOMENS DAILY FORMULA PO),  Take 1 tablet by mouth every evening. , Disp: , Rfl:  .  Ocrelizumab (OCREVUS IV), Inject 600 mg into the vein every 6 (six) months. Then 600 mg every 6 months. Next infusion due 09/2017, Disp: , Rfl:  .  Oxycodone HCl 10 MG TABS, Take 1 tablet (10 mg total) by mouth every 6 (six) hours as needed ((score 7 to 10))., Disp: 120 tablet, Rfl: 0 .  pantoprazole (PROTONIX) 40 MG tablet, TAKE 1 TABLET (40 MG TOTAL) BY MOUTH DAILY.  TAKE 30-60 MIN BEFORE FIRST MEAL OF THE DAY, Disp: 30 tablet, Rfl: 2 .  Spacer/Aero-Holding Chambers DEVI, 1 Units by Does not apply route 4 (four) times daily as needed., Disp: 1 each, Rfl: 2 .  tizanidine (ZANAFLEX) 2 MG capsule, Take 2 mg by mouth daily. , Disp: , Rfl:  .  valACYclovir (VALTREX) 1000 MG tablet, TAKE 1 TABLET 2 TIMES DAILY. THEN STAY ON ONE TABLET DAILY AS PREVENTION (Patient taking differently: daily. ), Disp: 40 tablet, Rfl: 5 .  calcium carbonate (OSCAL) 1500 (600 Ca) MG TABS tablet, Take 1,500 mg by mouth daily with breakfast., Disp: , Rfl:  .  Glycopyrrolate-Formoterol (BEVESPI AEROSPHERE) 9-4.8 MCG/ACT AERO, Inhale 2 puffs into the lungs 2 (two) times daily., Disp: 1 Inhaler, Rfl: 11 .  modafinil (PROVIGIL) 200 MG tablet, Take 200 mg by mouth daily., Disp: , Rfl: 5 Medication Side Effects: none  Family Medical/ Social History: Changes? No  MENTAL HEALTH EXAM:  There were no vitals taken for this visit.There is no height or weight on file to calculate BMI.  General Appearance: phone visit, unable to assess  Eye Contact:  unable to assess  Speech:  Clear and Coherent  Volume:  Normal  Mood:  Euthymic  Affect:  unable to assess  Thought Process:  Goal Directed  Orientation:  Full (Time, Place, and Person)  Thought Content: Logical   Suicidal Thoughts:  No  Homicidal Thoughts:  No  Memory:  WNL  Judgement:  Good  Insight:  Good  Psychomotor Activity:  unable to asses  Concentration:  Concentration: Good  Recall:  Good  Fund of Knowledge: Good  Language: Good  Assets:  Desire for Improvement  ADL's:  Intact  Cognition: WNL  Prognosis:  Good    DIAGNOSES:    ICD-10-CM   1. Generalized anxiety disorder F41.1   2. Major depressive disorder, recurrent episode, moderate (HCC) F33.1   3. Insomnia, unspecified type G47.00   4. MS (multiple sclerosis) ([place]) G35     Receiving Psychotherapy: Yes    RECOMMENDATIONS: Continue Wellbutrin SR 200 mg twice  daily. Continue Celexa 30 mg daily. Continue clonazepam 0.5 mg, half to 1 nightly as needed. Continue gabapentin 400 mg 3 times daily. Continue mirtazapine 15 mg, 1/2-1 nightly as needed sleep. Continue psychotherapy. Return in 6 weeks.   Donnal Moat, PA-C   This record has been created using [employer].  Chart creation errors have been sought, but may not always have been located and corrected. Such creation errors do not reflect on the standard of medical care.

## 2018-09-27 ENCOUNTER — Telehealth: Payer: Self-pay

## 2018-09-27 MED ORDER — UMECLIDINIUM-VILANTEROL 62.5-25 MCG/INH IN AEPB: 1.0000 | INHALATION_SPRAY | Freq: Every day | RESPIRATORY_TRACT | 11 refills | Status: DC

## 2018-09-27 NOTE — Telephone Encounter (Signed)
PA for Bevespi initiated via cover my meds.   [name] Key: [id number] - PA Case ID: [id number] - Rx #: [id number].   Will route message to [place] to hold to f/u or until we receive a determination.

## 2018-09-27 NOTE — Telephone Encounter (Signed)
Ok to try anoro one click each am but will need to have pharamcist show her how it works

## 2018-09-27 NOTE — Telephone Encounter (Signed)
Called and spoke with pt stating to her that the bevespi was not covered by insurance and we did a PA which was denied. Due to this, MW wants to switch her to [facility]. Pt expressed understanding. Verified pt's preferred pharmacy and also put note in there for pharmacy to teach pt how to use the inhaler. Nothing further needed.

## 2018-09-27 NOTE — Telephone Encounter (Signed)
PA has been denied. Per documentation patient has not tried and or failed Anoro or Stiolto.   MW please advise.

## 2018-09-28 ENCOUNTER — Telehealth: Payer: Self-pay | Admitting: Physician Assistant

## 2018-09-28 NOTE — Telephone Encounter (Signed)
Plumonologist changed her inhaler to Anoro from Symbicort. Pt no c/o of pressure in chest. Pt denies chest congestion or cough or fever. Wants to ask Ronnald Ramp opinion

## 2018-09-28 NOTE — Telephone Encounter (Signed)
Continue with the medication.  Recheck labs in a couple of weeks.

## 2018-09-30 ENCOUNTER — Other Ambulatory Visit: Payer: Self-pay | Admitting: Psychiatry

## 2018-09-30 DIAGNOSIS — F331 Major depressive disorder, recurrent, moderate: Secondary | ICD-10-CM

## 2018-10-01 ENCOUNTER — Other Ambulatory Visit: Payer: Self-pay | Admitting: Physician Assistant

## 2018-10-03 ENCOUNTER — Ambulatory Visit (INDEPENDENT_AMBULATORY_CARE_PROVIDER_SITE_OTHER): Payer: Medicare Other | Admitting: Psychiatry

## 2018-10-03 ENCOUNTER — Other Ambulatory Visit: Payer: Self-pay

## 2018-10-03 DIAGNOSIS — F411 Generalized anxiety disorder: Secondary | ICD-10-CM | POA: Diagnosis not present

## 2018-10-03 NOTE — Progress Notes (Signed)
[facility] Counselor/Therapist Progress Note  Patient ID: Angel Maddox, MRN: [id number],    Date: 10/03/2018  Time Spent: 60 minutes   12:00noon to 1:00pm  Treatment Type: Individual Therapy   Virtual Visit via Video Note I connected with patient by a video enabled telemedicine application or telephone, with their informed consent, and verified patient privacy and that I am speaking with the correct person using two identifiers.  I am at [place] and patient is at her home.  I discussed the limitations, risks, security and privacy concerns of performing psychotherapy and management service by telephone and the availability of in person appointments. I also discussed with the patient that there may be a patient responsible charge related to this service. The patient expressed understanding and agreed to proceed. I discussed the treatment planning with the patient. The patient was provided an opportunity to ask questions and all were answered. The patient agreed with the plan and demonstrated an understanding of the instructions. The patient was advised to call  our office if  symptoms worsen or feel they are in a crisis state and need immediate contact.  Reported Symptoms:   Anxiety, depression (has improved some), worried  Mental Status Exam:  Appearance:   Casual     Behavior:  Appropriate and Sharing  Motor:  Normal  Speech/Language:   Normal Rate  Affect:  anxious  Mood:  Anxiety and some depressed mood  Thought process:  normal  Thought content:    WNL  Sensory/Perceptual disturbances:    WNL  Orientation:  oriented to person, place, time/date, situation, day of week, month of year and year  Attention:  Fair  Concentration:  Fair  Memory:  Fair, per patient  Fund of knowledge:   Good  Insight:    Fair  Judgment:   Good  Impulse Control:  Good   Risk Assessment: Danger to Self:  No Self-injurious Behavior: No Danger to Others: No Duty to  Warn:no Physical Aggression / Violence:No  Access to Firearms a concern: No  Gang Involvement:No   Subjective:   Patient today reports primarily anxiety, and some depressed mood and worry in reference to current virus concerns.   Shared that she is still working with med provider, Donnal Moat, on reducing some of her medication.  States the reduction in med is going well as she is feeling better, "less drugged" and some decrease in depression.  Is currently anxious and has "some worry regarding the coronavirus issues".  Talked through all these concerns as patient was clearly focused on them today.  Seemed to help her to process feelings and thoughts, and to feel heard.  Self-care (including physical and mental) were emphasized with patient, offering her direct suggestions on ways of taking care of herself during this time of being concerned about the virus.  Focused on precautions to be taking and supported her having some control over her situation as the more consistently she follows the precautionary measures, the better are her chances for staying well.  Patient responded to this and seemed to feel more empowered by session end. Reviewed strategies we've talked about in prior sessions (especially CBT) to help with anxiety and depression.  We did not have enough time today, but I did ask patient about issues she brought up and worked with some in prior session re: her parents and some unresolved feelings that she feels contribute to her current struggles emotionally.  Patient did indicate that she would like to  talk further about those issues and we agreed to do so at another session.    Interventions: Solution-focused  and Cognitive Behavioral therapy  Diagnosis:   ICD-10-CM   1. Generalized anxiety disorder F41.1     Plan:  Patient to work with some CBT strategies mentioned above as well as strategies to help with anxiety, depression,  and situations that need re-framing.  Recognizing some  of her progress. Next session in 2 -3 wks.  [name], LCSW

## 2018-10-08 ENCOUNTER — Telehealth: Payer: Self-pay | Admitting: Physician Assistant

## 2018-10-08 ENCOUNTER — Other Ambulatory Visit: Payer: Self-pay | Admitting: Physician Assistant

## 2018-10-08 NOTE — Telephone Encounter (Signed)
Already left a msg., wanted to leave another one, patient wants to know if you want her to to back to regular dosage of Falexa she feels like she's about to bottom out again.

## 2018-10-08 NOTE — Telephone Encounter (Signed)
See phone note

## 2018-10-08 NOTE — Telephone Encounter (Signed)
At last visit 09/14/2018 we decreased Celexa because she really wants to get off all of her psych meds.  She was very adamant about decreasing something so we chose to go from Celexa 40 mg down to 30 mg.  A few days ago, she started feeling more depressed with anhedonia, low energy and motivation, and easy irritability and anger.  Denies suicidal or homicidal thoughts.  No increased energy with decreased need for sleep, increased spending or libido, no grandiosity. Advised to increase Celexa back to 40 mg.  She has left meds until next appointment at least.  She asked whether she will ever be able to get off of any of these medications.  I told her that since she was doing well on them for a while now, we need to leave the doses alone and we can discuss decreasing or weaning off meds at a later date.  I do not advise that at this point.

## 2018-10-08 NOTE — Telephone Encounter (Signed)
Patient said that she is having a hard time coming off the celexa. She was doing good until yesterday. She didn't sleep last night she is getting angry and very bitter. Please call her with what to do next? [phone]

## 2018-10-09 ENCOUNTER — Other Ambulatory Visit: Payer: Self-pay | Admitting: Physician Assistant

## 2018-10-09 NOTE — Telephone Encounter (Signed)
I received refill request for these 2 plus citalopram, I know she wants off her meds, is she currently taking all 3?

## 2018-10-10 ENCOUNTER — Ambulatory Visit (INDEPENDENT_AMBULATORY_CARE_PROVIDER_SITE_OTHER): Payer: Medicare Other | Admitting: Psychiatry

## 2018-10-10 ENCOUNTER — Other Ambulatory Visit: Payer: Self-pay

## 2018-10-10 ENCOUNTER — Other Ambulatory Visit: Payer: Self-pay | Admitting: Physician Assistant

## 2018-10-10 DIAGNOSIS — F411 Generalized anxiety disorder: Secondary | ICD-10-CM

## 2018-10-10 NOTE — Telephone Encounter (Signed)
Change to 40 mg qd #30 w/ 5 RF

## 2018-10-10 NOTE — Progress Notes (Signed)
[facility] Counselor/Therapist Progress Note  Patient ID: Angel Maddox, MRN: [id number],    Date: 10/10/2018  Time Spent: 60 minutes   9:10am to 10:00pm  Treatment Type: Individual Therapy   Virtual Visit via Video Note I connected with patient by a video enabled telemedicine application or telephone, with their informed consent, and verified patient privacy and that I am speaking with the correct person using two identifiers.  I am at [place] and patient is at her home.  I discussed the limitations, risks, security and privacy concerns of performing psychotherapy and management service by telephone and the availability of in person appointments. I also discussed with the patient that there may be a patient responsible charge related to this service. The patient expressed understanding and agreed to proceed. I discussed the treatment planning with the patient. The patient was provided an opportunity to ask questions and all were answered. The patient agreed with the plan and demonstrated an understanding of the instructions. The patient was advised to call  our office if  symptoms worsen or feel they are in a crisis state and need immediate contact.  Reported Symptoms:   Anxiety, depression (has improved some), worried  Mental Status Exam:  Appearance:   Casual     Behavior:  Appropriate and Sharing  Motor:  Normal  Speech/Language:   Normal Rate  Affect:  anxious  Mood:  Anxiety and some depressed mood  Thought process:  normal  Thought content:    WNL  Sensory/Perceptual disturbances:    WNL  Orientation:  oriented to person, place, time/date, situation, day of week, month of year and year  Attention:  Fair  Concentration:  Fair  Memory:  Fair, per patient  Fund of knowledge:   Good  Insight:    Fair  Judgment:   Good  Impulse Control:  Good   Risk Assessment: Danger to Self:  No Self-injurious Behavior: No Danger to Others: No Duty to  Warn:no Physical Aggression / Violence:No  Access to Firearms a concern: No  Gang Involvement:No   Subjective:   Patient requested earlier appt. Yesterday so was scheduled today.  Was very upset after recent appt where she was wanting to get back on medication.  Has started back on Celexa and questioning whether she'll always have to be on it.  I acknowledged that it not clear right now and also reminded her of her history of being on it and that it helped at some point. Encouraged her to be patient with the medication and with herself as it takes time for the medication to be effective again.   Feeling some anger and hurt re: husband reportedly not showing intimacy towards her.  Discussed this in some detail with her and agreed to pick up on it again next session.  For now her focus is to stay on her meds as prescribed and trying to change her view to where she is looking for what may go right versus wrong.   Encouraged overall self-care and following all the coronavirus precautions.   Interventions: Solution-focused  and Cognitive Behavioral therapy  Diagnosis:   ICD-10-CM   1. Generalized anxiety disorder F41.1     Plan:  Patient to work with some CBT strategies mentioned above as well as strategies to help with anxiety, depression,  and situations that need re-framing.  Recognizing some of her progress. Next session in 2 -3 wks.  [name], LCSW

## 2018-10-10 NOTE — Telephone Encounter (Signed)
Want me to change to 40 mg/day or 2 20mg  since the recommendation to go back up?

## 2018-10-15 ENCOUNTER — Telehealth: Payer: Self-pay | Admitting: General Practice

## 2018-10-15 ENCOUNTER — Other Ambulatory Visit: Payer: Self-pay

## 2018-10-15 NOTE — Telephone Encounter (Signed)
Called & spoke w/ pt regarding MW's recommendations. Pt verbalized understanding and agreed to set up an appt in-office to see CY for Thursday 10/18/2018 at 11:30 AM. Negative for COVID screen. Will route to CY as an [place]. Nothing further needed at this time.

## 2018-10-15 NOTE — Telephone Encounter (Signed)
Returned call to patient. She is requesting to see another pulmonologist in office. She did not have a particular provider in mind. She was not satisfied with visit with Dr. Melvyn Novas. She says he has not given her a true diagnosis. She is still having sob on Anoro. She did  a partial PFT but it was not completed.Explained to patient this is an important test in diagnosing pulmonary disease. She wants to try PFT again when schedules open back up.

## 2018-10-15 NOTE — Telephone Encounter (Signed)
Fine with me  To my knowledge we are not doing pfts in this setting due to COVID-19 restrictions so ok to see Dr Annamaria Boots if he agrees and he can decide how critical it may be to repeat the pfts

## 2018-10-16 ENCOUNTER — Ambulatory Visit (INDEPENDENT_AMBULATORY_CARE_PROVIDER_SITE_OTHER): Payer: Medicare Other | Admitting: Physician Assistant

## 2018-10-16 ENCOUNTER — Other Ambulatory Visit: Payer: Self-pay

## 2018-10-16 ENCOUNTER — Encounter: Payer: Self-pay | Admitting: Physician Assistant

## 2018-10-16 VITALS — BP 145/96 | HR 76 | Temp 98.6°F | Ht 67.0 in | Wt 137.0 lb

## 2018-10-16 DIAGNOSIS — E876 Hypokalemia: Secondary | ICD-10-CM

## 2018-10-16 DIAGNOSIS — E871 Hypo-osmolality and hyponatremia: Secondary | ICD-10-CM

## 2018-10-16 DIAGNOSIS — H6122 Impacted cerumen, left ear: Secondary | ICD-10-CM

## 2018-10-16 DIAGNOSIS — Z0000 Encounter for general adult medical examination without abnormal findings: Secondary | ICD-10-CM

## 2018-10-16 NOTE — Progress Notes (Signed)
BP (!) 145/96   Pulse 76   Temp 98.6 F (37 C) (Oral)   Ht '5\' 7"'  (1.702 m)   Wt 137 lb (62.1 kg)   BMI 21.46 kg/m    Subjective:    Patient ID: Angel Maddox, female    DOB: [birth year], 63 y.o.   MRN: [id number]  HPI: Angel Maddox is a 63 y.o. female presenting on 10/16/2018 for Ear Pain (stopped up )  The patient comes in for decreased hearing in her left ear due to cerumen.  She has had this problem before and has had to have her ears irrigated.  She states the right side is not really bothering her that much.  There is going on for couple of weeks.  And she would like to have treatment today  The patient has an upcoming pulmonology appointment with Dr. Annamaria Boots with [facility] pulmonology.  A lot of testing has been done and she still is not feeling any better.  Today in the office with having to wear a mask she felt short of breath just because it was covering her breathing.  She has had to use her albuterol 4 times a day.  She stated when she for started the Anoro it seemed to help some but that did not last very long and she is having more difficulty than ever.  Finally she had the problem with her hyponatremia.  We are due labs today.  On the last check it had improved.  So we will have this performed today.  Past Medical History:  Diagnosis Date  . Anxiety   . Arthritis    osteo  . Bipolar 1 disorder ([place])   . Cervicalgia   . Chronic kidney disease    kidney stone  . COPD (chronic obstructive pulmonary disease) ([place])    new diagnosis- now sees pulmonologist  . Depression   . Elevated liver enzymes    She says "normal now"  . GERD (gastroesophageal reflux disease)   . H/O hiatal hernia   . Headache   . Hip fracture ([place]) 2017   left  . History of kidney stones    4-5 yrs ago  . Hypertension   . Multiple sclerosis ([place])     Had for 15 years  . Multiple sclerosis ([place])    dx 1999  . Tremors of nervous system    Relevant past medical, surgical, family and  social history reviewed and updated as indicated. Interim medical history since our last visit reviewed. Allergies and medications reviewed and updated. DATA REVIEWED: CHART IN EPIC  Family History reviewed for pertinent findings.  Review of Systems  Constitutional: Negative.  Negative for activity change, fatigue and fever.  HENT: Positive for hearing loss.   Eyes: Negative.   Respiratory: Positive for cough, shortness of breath and wheezing.   Cardiovascular: Negative.  Negative for chest pain.  Gastrointestinal: Negative.  Negative for abdominal pain.  Endocrine: Negative.   Genitourinary: Negative.  Negative for dysuria.  Musculoskeletal: Negative.   Skin: Negative.   Neurological: Negative.     Allergies as of 10/16/2018      Reactions   Hydrocodone Itching      Medication List       Accurate as of October 16, 2018  2:14 PM. Always use your most recent med list.        albuterol 108 (90 Base) MCG/ACT inhaler Commonly known as:  VENTOLIN HFA Inhale 2 puffs into the lungs every 4 (four) hours  as needed for wheezing or shortness of breath.   buPROPion 200 MG 12 hr tablet Commonly known as:  WELLBUTRIN SR TAKE 1 TABLET BY MOUTH TWICE A DAY   citalopram 40 MG tablet Commonly known as:  CELEXA Take 1 tablet (40 mg total) by mouth daily.   clonazePAM 0.5 MG tablet Commonly known as:  KLONOPIN Take 0.5 tablets (0.25 mg total) by mouth at bedtime. Takes half a tablet   gabapentin 800 MG tablet Commonly known as:  NEURONTIN Take 400 mg by mouth 3 (three) times daily. Takes half tablet   hydrochlorothiazide 25 MG tablet Commonly known as:  HYDRODIURIL Take 0.5 tablets (12.5 mg total) by mouth daily.   ibuprofen 200 MG tablet Commonly known as:  ADVIL Take 800 mg by mouth every 8 (eight) hours as needed for moderate pain.   mirtazapine 15 MG tablet Commonly known as:  REMERON TAKE 0.5-1 TABLETS (7.5-15 MG TOTAL) BY MOUTH AT BEDTIME AS NEEDED.   modafinil 200 MG  tablet Commonly known as:  PROVIGIL Take 200 mg by mouth daily.   Oxycodone HCl 10 MG Tabs Take 1 tablet (10 mg total) by mouth every 6 (six) hours as needed ((score 7 to 10)).   pantoprazole 40 MG tablet Commonly known as:  PROTONIX TAKE 1 TABLET (40 MG TOTAL) BY MOUTH DAILY. TAKE 30-60 MIN BEFORE FIRST MEAL OF THE DAY   riTUXimab 100 MG/10ML injection Commonly known as:  RITUXAN Inject into the vein.   Spacer/Aero-Holding [employer] 1 Units by Does not apply route 4 (four) times daily as needed.   tizanidine 2 MG capsule Commonly known as:  ZANAFLEX Take 2 mg by mouth daily.   umeclidinium-vilanterol 62.5-25 MCG/INH Aepb Commonly known as:  Anoro Ellipta Inhale 1 puff into the lungs daily.   valACYclovir 1000 MG tablet Commonly known as:  VALTREX TAKE 1 TABLET 2 TIMES DAILY. THEN STAY ON ONE TABLET DAILY AS PREVENTION   WOMENS DAILY FORMULA PO Take 1 tablet by mouth every evening.          Objective:    BP (!) 145/96   Pulse 76   Temp 98.6 F (37 C) (Oral)   Ht '5\' 7"'  (1.702 m)   Wt 137 lb (62.1 kg)   BMI 21.46 kg/m   Allergies  Allergen Reactions  . Hydrocodone Itching    Wt Readings from Last 3 Encounters:  10/16/18 137 lb (62.1 kg)  09/17/18 134 lb 9.6 oz (61.1 kg)  09/04/18 133 lb 4 oz (60.4 kg)    Physical Exam Constitutional:      Appearance: She is well-developed.  HENT:     Head: Normocephalic and atraumatic.     Ears:     Comments: Right ear with minimal cerumen in the canal.  Right ear with impacted cerumen.  After lavage patient had great removal of cerumen and return to her hearing. Eyes:     Conjunctiva/sclera: Conjunctivae normal.     Pupils: Pupils are equal, round, and reactive to light.  Cardiovascular:     Rate and Rhythm: Normal rate and regular rhythm.     Heart sounds: Normal heart sounds.  Pulmonary:     Effort: Pulmonary effort is normal.     Breath sounds: Normal breath sounds.  Abdominal:     General: Bowel sounds  are normal.     Palpations: Abdomen is soft.  Skin:    General: Skin is warm and dry.     Findings: No rash.  Neurological:  Mental Status: She is alert and oriented to person, place, and time.     Deep Tendon Reflexes: Reflexes are normal and symmetric.  Psychiatric:        Behavior: Behavior normal.        Thought Content: Thought content normal.        Judgment: Judgment normal.     Results for orders placed or performed in visit on 09/26/18  CMP14+EGFR  Result Value Ref Range   Glucose 79 65 - 99 mg/dL   BUN 13 8 - 27 mg/dL   Creatinine, Ser 0.91 0.57 - 1.00 mg/dL   GFR calc non Af Amer 68 >59 mL/min/1.73   GFR calc Af Amer 78 >59 mL/min/1.73   BUN/Creatinine Ratio 14 12 - 28   Sodium 131 (L) 134 - 144 mmol/L   Potassium 4.0 3.5 - 5.2 mmol/L   Chloride 89 (L) 96 - 106 mmol/L   CO2 25 20 - 29 mmol/L   Calcium 9.2 8.7 - 10.3 mg/dL   Total Protein 6.9 6.0 - 8.5 g/dL   Albumin 4.6 3.8 - 4.8 g/dL   Globulin, Total 2.3 1.5 - 4.5 g/dL   Albumin/Globulin Ratio 2.0 1.2 - 2.2   Bilirubin Total 0.3 0.0 - 1.2 mg/dL   Alkaline Phosphatase 126 (H) 39 - 117 IU/L   AST 50 (H) 0 - 40 IU/L   ALT 51 (H) 0 - 32 IU/L  CBC with Differential/Platelet  Result Value Ref Range   WBC 5.0 3.4 - 10.8 x10E3/uL   RBC 4.16 3.77 - 5.28 x10E6/uL   Hemoglobin 13.4 11.1 - 15.9 g/dL   Hematocrit 39.3 34.0 - 46.6 %   MCV 95 79 - 97 fL   MCH 32.2 26.6 - 33.0 pg   MCHC 34.1 31.5 - 35.7 g/dL   RDW 13.6 11.7 - 15.4 %   Platelets 271 150 - 450 x10E3/uL   Neutrophils 47 Not Estab. %   Lymphs 38 Not Estab. %   Monocytes 11 Not Estab. %   Eos 3 Not Estab. %   Basos 1 Not Estab. %   Neutrophils Absolute 2.3 1.4 - 7.0 x10E3/uL   Lymphocytes Absolute 1.9 0.7 - 3.1 x10E3/uL   Monocytes Absolute 0.5 0.1 - 0.9 x10E3/uL   EOS (ABSOLUTE) 0.2 0.0 - 0.4 x10E3/uL   Basophils Absolute 0.1 0.0 - 0.2 x10E3/uL   Immature Granulocytes 0 Not Estab. %   Immature Grans (Abs) 0.0 0.0 - 0.1 x10E3/uL       Assessment & Plan:   1. Hyponatremia syndrome - CMP14+EGFR - CBC with Differential/Platelet  2. Hypokalemia - CMP14+EGFR - CBC with Differential/Platelet  3. Well adult exam - Lipid panel  4. Hearing loss due to cerumen impaction, left Ear irrigation performed by nurse. Successful removal of earwax   Continue all other maintenance medications as listed above.  Follow up plan: Recheck 3 months.  Educational handout given for Advance PA-C [place] [address]  [place], [place] [address] [phone]   10/16/2018, 2:14 PM

## 2018-10-17 ENCOUNTER — Ambulatory Visit (INDEPENDENT_AMBULATORY_CARE_PROVIDER_SITE_OTHER): Payer: Medicare Other | Admitting: Psychiatry

## 2018-10-17 ENCOUNTER — Ambulatory Visit: Payer: Self-pay | Admitting: Physician Assistant

## 2018-10-17 DIAGNOSIS — F411 Generalized anxiety disorder: Secondary | ICD-10-CM

## 2018-10-17 LAB — CBC WITH DIFFERENTIAL/PLATELET
Basophils Absolute: 0.1 10*3/uL (ref 0.0–0.2)
Basos: 1 %
EOS (ABSOLUTE): 0.1 10*3/uL (ref 0.0–0.4)
Eos: 1 %
Hematocrit: 41.1 % (ref 34.0–46.6)
Hemoglobin: 14.2 g/dL (ref 11.1–15.9)
Immature Grans (Abs): 0 10*3/uL (ref 0.0–0.1)
Immature Granulocytes: 0 %
Lymphocytes Absolute: 2.1 10*3/uL (ref 0.7–3.1)
Lymphs: 29 %
MCH: 32.7 pg (ref 26.6–33.0)
MCHC: 34.5 g/dL (ref 31.5–35.7)
MCV: 95 fL (ref 79–97)
Monocytes Absolute: 0.8 10*3/uL (ref 0.1–0.9)
Monocytes: 11 %
Neutrophils Absolute: 4 10*3/uL (ref 1.4–7.0)
Neutrophils: 58 %
Platelets: 287 10*3/uL (ref 150–450)
RBC: 4.34 x10E6/uL (ref 3.77–5.28)
RDW: 13.9 % (ref 11.7–15.4)
WBC: 7 10*3/uL (ref 3.4–10.8)

## 2018-10-17 LAB — CMP14+EGFR
ALT: 40 IU/L — ABNORMAL HIGH (ref 0–32)
AST: 35 IU/L (ref 0–40)
Albumin/Globulin Ratio: 1.9 (ref 1.2–2.2)
Albumin: 4.6 g/dL (ref 3.8–4.8)
Alkaline Phosphatase: 157 IU/L — ABNORMAL HIGH (ref 39–117)
BUN/Creatinine Ratio: 13 (ref 12–28)
BUN: 12 mg/dL (ref 8–27)
Bilirubin Total: <0.2 mg/dL (ref 0.0–1.2)
CO2: 26 mmol/L (ref 20–29)
Calcium: 9.5 mg/dL (ref 8.7–10.3)
Chloride: 87 mmol/L — ABNORMAL LOW (ref 96–106)
Creatinine, Ser: 0.93 mg/dL (ref 0.57–1.00)
GFR calc Af Amer: 76 mL/min/{1.73_m2} (ref >59)
GFR calc non Af Amer: 66 mL/min/{1.73_m2} (ref >59)
Globulin, Total: 2.4 g/dL (ref 1.5–4.5)
Glucose: 82 mg/dL (ref 65–99)
Potassium: 4.2 mmol/L (ref 3.5–5.2)
Sodium: 132 mmol/L — ABNORMAL LOW (ref 134–144)
Total Protein: 7 g/dL (ref 6.0–8.5)

## 2018-10-17 LAB — LIPID PANEL
Chol/HDL Ratio: 3.7 ratio (ref 0.0–4.4)
Cholesterol, Total: 348 mg/dL — ABNORMAL HIGH (ref 100–199)
HDL: 93 mg/dL (ref >39)
LDL Calculated: 217 mg/dL — ABNORMAL HIGH (ref 0–99)
Triglycerides: 192 mg/dL — ABNORMAL HIGH (ref 0–149)
VLDL Cholesterol Cal: 38 mg/dL (ref 5–40)

## 2018-10-17 NOTE — Progress Notes (Signed)
[facility] Counselor/Therapist Progress Note  Patient ID: Angel Maddox, MRN: [id number],    Date: 10/17/2018  Time Spent: 60 minutes   11:00am to 12:00noon  Treatment Type: Individual Therapy   Virtual Visit via Video Note I connected with patient by a video enabled telemedicine application or telephone, with their informed consent, and verified patient privacy and that I am speaking with the correct person using two identifiers.  I am at [place] and patient is at her home.  I discussed the limitations, risks, security and privacy concerns of performing psychotherapy and management service by telephone and the availability of in person appointments. I also discussed with the patient that there may be a patient responsible charge related to this service. The patient expressed understanding and agreed to proceed. I discussed the treatment planning with the patient. The patient was provided an opportunity to ask questions and all were answered. The patient agreed with the plan and demonstrated an understanding of the instructions. The patient was advised to call  our office if  symptoms worsen or feel they are in a crisis state and need immediate contact.  Reported Symptoms:   Anxiety and "worries" easily at times Mental Status Exam:  Appearance:   Casual     Behavior:  Appropriate and Sharing  Motor:  Normal  Speech/Language:   Normal Rate  Affect:  anxious  Mood:  Anxiety and some depressed mood  Thought process:  normal  Thought content:    WNL  Sensory/Perceptual disturbances:    WNL  Orientation:  oriented to person, place, time/date, situation, day of week, month of year and year  Attention:  Fair  Concentration:  Fair  Memory:  Fair, per patient  Fund of knowledge:   Good  Insight:    Fair  Judgment:   Good  Impulse Control:  Good   Risk Assessment: Danger to Self:  No Self-injurious Behavior: No Danger to Others: No Duty to Warn:no Physical  Aggression / Violence:No  Access to Firearms a concern: No  Gang Involvement:No   Subjective:     Patient reports symptoms noted above, but is also feeling some better today as she is getting back on the Celexa.  Is more at peace with the fact she's had to get back on a little higher dosage.   Followup on her feeling some anger and hurt re: husband reportedly not showing intimacy towards her. States she talked with husband this past weekend about this issue more directly.  Patient states that she was aware that her mood was "not the best" at the time and it did not go well because of her state of mind.  Talked about this in further detail and she plans to talk with him again but when she can be more open and think about "how to say" what she wants to say in conversation with husband, so she can be better heard.    For now her focus is to stay on her meds as prescribed and continue working  to change her view to where she is looking for what may go right versus wrong.   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   Interventions: Solution-focused  and Cognitive Behavioral therapy  Diagnosis:   ICD-10-CM   1. Generalized anxiety disorder F41.1     Plan:  Patient to work with some CBT strategies mentioned above as well as strategies to help with anxiety, depression,  and situations that need re-framing.  Goal review and patient recognizing some of her progress. Next session in 2 wks.  [name], LCSW

## 2018-10-18 ENCOUNTER — Other Ambulatory Visit: Payer: Self-pay

## 2018-10-18 ENCOUNTER — Ambulatory Visit: Payer: Medicare Other | Admitting: Internal Medicine

## 2018-10-18 ENCOUNTER — Encounter: Payer: Self-pay | Admitting: Internal Medicine

## 2018-10-18 VITALS — BP 134/96 | HR 80 | Ht 67.0 in | Wt 139.0 lb

## 2018-10-18 DIAGNOSIS — R0609 Other forms of dyspnea: Secondary | ICD-10-CM | POA: Diagnosis not present

## 2018-10-18 DIAGNOSIS — G35 Multiple sclerosis: Secondary | ICD-10-CM | POA: Diagnosis not present

## 2018-10-18 DIAGNOSIS — J449 Chronic obstructive pulmonary disease, unspecified: Secondary | ICD-10-CM | POA: Diagnosis not present

## 2018-10-18 MED ORDER — IPRATROPIUM-ALBUTEROL 0.5-2.5 (3) MG/3ML IN SOLN: RESPIRATORY_TRACT | 12 refills | Status: DC

## 2018-10-18 MED ORDER — COMPRESSOR/NEBULIZER MISC: 0 refills | Status: DC

## 2018-10-18 NOTE — Progress Notes (Signed)
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 Reports having issues with shortness of breath, chest congestion, coughing.  Previous eval by Dr Melvyn Novas- notes reviewed. His impression was IBS. Medical problem list includes COPD, GERD, Chronic constipation, Multiple Sclerosis, Osteoporosis, Depression/ Anxiety, Malnutrition, Hyponatremia, abnormal LFTs Medication includes Anoro, albuterol hfa, clonazepam, gabapentin, welbutrin, citalopram, mirtazapine, provigil,   C/O dyspnea with sense of chest congestion but minimal white sputum. Onset about 1.5 years ago, no definite trigger remembered, gradually worse. Feels something should come out, but dry cough.Symbicort initially helped, but not any longer. Rescue inhaler helps some. Change to Anoro w/o benefit.  Ok supine and breathing doesn't wake her- this is mostly DOE w/ ADLs. Denies heart or prior lung condition. Remote former smoker. Not able to perform PFT maneuvers on initial trial and wants to try again when Covid rules allow.  Has not had cardiac w/u. Pror physical therapy - little effect.  Uses rolling walker due to MS. Her MS neurologist, Dr Gorden Harms, has moved several times and she has followed him. He told her didn't think symptoms were due to MS.   CTa chest 06/05/18  increased bronchial thickening s emphysema - unable to perform pfts 07/03/18 - 09/17/2018   Walked RA x one lap =  approx 250 ft - stopped due to  Sob with sats high 90's and obvious breath holding then gasping expiratory efforts -Quit smoking 1992 with onset doe 2018 in setting of MS/ anxiety  - CTa chest 06/05/18  increased bronchial thickening s emphysema - Spirometry 07/03/2018  Not physiologic - 07/03/2018   Walked RA x 10 ft - stopped due to sob and sats 89%   - 07/03/2018 alpha one screen   MS  Level 111  - 08/02/2018   symbicort 80 2bid  - 09/17/2018  After extensive coaching inhaler device,  effectiveness =    75% from a baseline of around 25 % >  continue symbicort/ consider trial of spiriva next step     CXR 09/17/2018--Emphysematous changes without infiltrate.- CBC 10/16/2018- WNL CMP 10/16/2018- elevated transaminases, Na 132  Prior to Admission medications   Medication Sig Start Date End Date Taking? Authorizing Provider  albuterol (PROVENTIL HFA;VENTOLIN HFA) 108 (90 Base) MCG/ACT inhaler Inhale 2 puffs into the lungs every 4 (four) hours as needed for wheezing or shortness of breath. 06/12/18  Yes Terald Sleeper, PA-C  buPROPion (WELLBUTRIN SR) 200 MG 12 hr tablet TAKE 1 TABLET BY MOUTH TWICE A DAY 10/09/18  Yes Hurst, Teresa T, PA-C  citalopram (CELEXA) 40 MG tablet Take 1 tablet (40 mg total) by mouth daily. 10/10/18  Yes Hurst, Dorothea Glassman, PA-C  clonazePAM (KLONOPIN) 0.5 MG tablet Take 0.5 tablets (0.25 mg total) by mouth at bedtime. Takes half a tablet 09/11/18  Yes Hurst, Teresa T, PA-C  famotidine (PEPCID) 20 MG tablet Take 20 mg by mouth at bedtime.   Yes [provider]  gabapentin (NEURONTIN) 800 MG tablet Take 400 mg by mouth 3 (three) times daily. Takes half tablet   Yes [provider]  hydrochlorothiazide (HYDRODIURIL) 25 MG tablet Take 0.5 tablets (12.5 mg total) by mouth daily. 01/08/18  Yes Terald Sleeper, PA-C  mirtazapine (REMERON) 15 MG tablet TAKE 0.5-1 TABLETS (7.5-15 MG TOTAL) BY MOUTH AT BEDTIME AS NEEDED. 10/09/18  Yes Hurst, Teresa T, PA-C  Oxycodone HCl 10 MG TABS Take 1 tablet (10 mg total) by mouth every 6 (six) hours as needed ((score 7 to 10)).  07/10/18  Yes Terald Sleeper, PA-C  pantoprazole (PROTONIX) 40 MG tablet TAKE 1 TABLET (40 MG TOTAL) BY MOUTH DAILY. TAKE 30-60 MIN BEFORE FIRST MEAL OF THE DAY 09/11/18  Yes Tanda Rockers, MD  primidone (MYSOLINE) 250 MG tablet Take 250 mg by mouth 4 (four) times daily.   Yes [provider]  riTUXimab (RITUXAN) 100 MG/10ML injection Inject into the vein.   Yes [provider]  tizanidine (ZANAFLEX) 2 MG capsule Take 2 mg by mouth  daily.    Yes [provider]  umeclidinium-vilanterol (ANORO ELLIPTA) 62.5-25 MCG/INH AEPB Inhale 1 puff into the lungs daily. 09/27/18  Yes Tanda Rockers, MD  valACYclovir (VALTREX) 1000 MG tablet TAKE 1 TABLET 2 TIMES DAILY. THEN STAY ON ONE TABLET DAILY AS PREVENTION Patient taking differently: daily.  02/27/18  Yes Terald Sleeper, PA-C  ipratropium-albuterol (DUONEB) 0.5-2.5 (3) MG/3ML SOLN 1 neb every 6 hours as needed for help breathing 10/18/18   Deneise Lever, MD  Nebulizers (COMPRESSOR/NEBULIZER) MISC Use as directed 10/18/18   Deneise Lever, MD   Past Medical History:  Diagnosis Date  . Anxiety   . Arthritis    osteo  . Bipolar 1 disorder ([place])   . Cervicalgia   . Chronic kidney disease    kidney stone  . COPD (chronic obstructive pulmonary disease) ([place])    new diagnosis- now sees pulmonologist  . Depression   . Elevated liver enzymes    She says "normal now"  . GERD (gastroesophageal reflux disease)   . H/O hiatal hernia   . Headache   . Hip fracture ([place]) 2017   left  . History of kidney stones    4-5 yrs ago  . Hypertension   . Multiple sclerosis ([place])     Had for 15 years  . Multiple sclerosis ([place])    dx 1999  . Tremors of nervous system    Past Surgical History:  Procedure Laterality Date  . ANTERIOR CERVICAL DECOMP/DISCECTOMY FUSION N/A 07/28/2017   Procedure: CERVICAL FOUR-FIVE, CERVICAL FIVE-SIX ANTERIOR CERVICAL DECOMPRESSION/DISCECTOMY FUSION;  Surgeon: Eustace Moore, MD;  Location: [facility];  Service: Neurosurgery;  Laterality: N/A;  . COLONOSCOPY  2008   Dr.Kaplan  . EYE SURGERY     [facility]  . HIP PINNING,CANNULATED Left 11/09/2015   Procedure: CANNULATED HIP PINNING;  Surgeon: Rod Can, MD;  Location: [facility];  Service: Orthopedics;  Laterality: Left;  . LIPOMA EXCISION    . TUBAL LIGATION     Family History  Problem Relation Age of Onset  . Heart attack Mother   . Cancer Maternal Aunt   . Colon cancer Neg Hx    . Stomach cancer Neg Hx    Social History   Socioeconomic History  . Marital status: Married    Spouse name: Not on file  . Number of children: Not on file  . Years of education: Not on file  . Highest education level: Not on file  Occupational History  . Not on file  Social Needs  . Financial resource strain: Not on file  . Food insecurity:    Worry: Not on file    Inability: Not on file  . Transportation needs:    Medical: Not on file    Non-medical: Not on file  Tobacco Use  . Smoking status: Former Smoker    Packs/day: 0.25    Years: 15.00    Pack years: 3.75    Types: Cigarettes  Last attempt to quit: 01/25/1991    Years since quitting: 27.7  . Smokeless tobacco: Never Used  Substance and Sexual Activity  . Alcohol use: No  . Drug use: No  . Sexual activity: Yes  Lifestyle  . Physical activity:    Days per week: Not on file    Minutes per session: Not on file  . Stress: Not on file  Relationships  . Social connections:    Talks on phone: Not on file    Gets together: Not on file    Attends religious service: Not on file    Active member of club or organization: Not on file    Attends meetings of clubs or organizations: Not on file    Relationship status: Not on file  . Intimate partner violence:    Fear of current or ex partner: Not on file    Emotionally abused: Not on file    Physically abused: Not on file    Forced sexual activity: Not on file  Other Topics Concern  . Not on file  Social History Narrative  . Not on file   ROS-see HPI   + = positive Constitutional:    weight loss, night sweats, fevers, chills, fatigue, lassitude. HEENT:    headaches, difficulty swallowing, tooth/dental problems, sore throat,       sneezing, itching, ear ache, nasal congestion, post nasal drip, snoring CV:    chest pain, orthopnea, PND, swelling in lower extremities, anasarca,                                  dizziness, palpitations Resp:   +shortness of breath  with exertion or at rest.                +productive cough,   +non-productive cough, coughing up of blood.              change in color of mucus.  wheezing.   Skin:    rash or lesions. GI:  No-   heartburn, indigestion, +abdominal pain, nausea, vomiting, diarrhea,                 change in bowel habits, loss of appetite GU: dysuria, change in color of urine, no urgency or frequency.   flank pain. MS:   joint pain, stiffness, decreased range of motion, back pain. Neuro-   + MS Psych:  change in mood or affect.  depression or +anxiety.   memory loss.  OBJ- Physical Exam General- Alert, Oriented, Affect-appropriate, Distress- none acute, + rolling walker Skin- rash-none, lesions- none, excoriation- none Lymphadenopathy- none Head- atraumatic            Eyes- Gross vision intact, PERRLA, conjunctivae and secretions clear            Ears- Hearing, canals-normal            Nose- Clear, no-Septal dev, mucus, polyps, erosion, perforation             Throat- Mallampati II , mucosa clear , drainage- none, tonsils- atrophic Neck- flexible , trachea midline, no stridor , thyroid nl, carotid no bruit Chest - symmetrical excursion , unlabored           Heart/CV- RRR , no murmur , no gallop  , no rub, nl s1 s2                           -  JVD- none , edema- none, stasis changes- none, varices- none           Lung- clear to P&A, wheeze- none, cough- none , dullness-none, rub- none           Chest wall-  Abd-  Br/ Gen/ Rectal- Not done, not indicated Extrem- cyanosis- none, clubbing, none, atrophy- none, strength- nl Neuro- grossly intact to observation

## 2018-10-18 NOTE — Patient Instructions (Addendum)
Order- schedule Echocardiogram    Dx dyspnea on exertion  Order- new DME, new compressor nebulizer machine, script printed for Duoneb       Try giving yourself a nebulizer treatment up to every 6 hours, if needed, for shortness of breath and breathing discomfort, to see if it helps. You can try this instead of your other inhalers if helpful. See what works best.  Please call as needed

## 2018-10-22 ENCOUNTER — Telehealth: Payer: Self-pay | Admitting: Internal Medicine

## 2018-10-22 ENCOUNTER — Telehealth: Payer: Self-pay | Admitting: Physician Assistant

## 2018-10-22 NOTE — Telephone Encounter (Signed)
Called and spoke with pt in regards to the Pepcid and asked her how long she has been having problems with the nausea and pt stated that she has had it ever since being put on the medication. Pt then stated she thought she was going to be put on a med to help with acid reflux and I stated to her that Pepcid was a medication to help with acid reflux which she is supposed to be taking at night. Pt stated to me that she has been taking it at night and wanted to know what Pantoprazole was supposed to be used for as she thought that was for acid reflux and I stated to pt that Pantoprazole was also an acid reflux medication which she is supposed to be taking 30-78min before her first meal of the day. Pt expressed understanding and stated she is taking it as prescribed but she is still having a feeling of nausea and a burning pain in her stomach which goes up to her esophagus.  I asked pt if she had a GI doctor and pt stated to me that she did. Stated to pt that she needs to contact her GI doctor to get further recommendations and pt verbalized understanding. Nothing further needed.

## 2018-10-22 NOTE — Telephone Encounter (Signed)
Pt called -aware to discontinue. She doesn't have a appt for 4 mos.  She will call them.

## 2018-10-22 NOTE — Telephone Encounter (Signed)
Since is bothering her stomach at this time I would recommend that she discontinue it.  However let us let the pulmonologist decide the next medication to be used.  Is there an upcoming appointment?

## 2018-10-22 NOTE — Telephone Encounter (Signed)
PT states that a pulmonologist put her on famotidine (PEPCID) 20 MG tablet and that ever since she started taking it it has caused her to be sick on her stomach and burning feeling, offered pt a televisit with Glenard Haring today, pt denied states that she would rather me send a message to the nurse.

## 2018-10-23 NOTE — Assessment & Plan Note (Signed)
She is being followed for this condition, but Im not sure how well controlled she is currently.

## 2018-10-23 NOTE — Assessment & Plan Note (Addendum)
Imaging suggest some degree of chronic bronchitis, possibly some emphysema. Natural course of this condition is gradual progression. I suspect there is a component of weakness and anxiety, related to her MS. When Covid status eases, we will try again for objective PFTs. If she is having some difficulty with inhalation/ exhalation, she might notice better treatment response with a nebulizer machine for aerosol meds. Echocardiogram will help exclude a cardiac component. Need to watch anemia and hyponatremia. If worse, these can contribute to weakness and malaise.

## 2018-10-24 ENCOUNTER — Encounter: Payer: Self-pay | Admitting: General Surgery

## 2018-10-25 ENCOUNTER — Encounter: Payer: Self-pay | Admitting: Gastroenterology

## 2018-10-25 ENCOUNTER — Other Ambulatory Visit: Payer: Self-pay | Admitting: Physician Assistant

## 2018-10-25 ENCOUNTER — Other Ambulatory Visit: Payer: Self-pay

## 2018-10-25 ENCOUNTER — Ambulatory Visit (INDEPENDENT_AMBULATORY_CARE_PROVIDER_SITE_OTHER): Payer: Medicare Other | Admitting: Gastroenterology

## 2018-10-25 VITALS — BP 140/98 | Wt 137.0 lb

## 2018-10-25 DIAGNOSIS — R110 Nausea: Secondary | ICD-10-CM | POA: Diagnosis not present

## 2018-10-25 DIAGNOSIS — R1310 Dysphagia, unspecified: Secondary | ICD-10-CM

## 2018-10-25 DIAGNOSIS — K219 Gastro-esophageal reflux disease without esophagitis: Secondary | ICD-10-CM

## 2018-10-25 DIAGNOSIS — R748 Abnormal levels of other serum enzymes: Secondary | ICD-10-CM | POA: Diagnosis not present

## 2018-10-25 MED ORDER — SUCRALFATE 1 GM/10ML PO SUSP: 1.0000 g | Freq: Three times a day (TID) | ORAL | 3 refills | Status: DC | PRN

## 2018-10-25 MED ORDER — ONDANSETRON 4 MG PO TBDP: 4.0000 mg | ORAL_TABLET | Freq: Three times a day (TID) | ORAL | 2 refills | Status: DC | PRN

## 2018-10-25 NOTE — Patient Instructions (Addendum)
If you are age 63 or older, your body mass index should be between 23-30. Your Body mass index is 21.46 kg/m. If this is out of the aforementioned range listed, please consider follow up with your Primary Care Provider.  If you are age 28 or younger, your body mass index should be between 19-25. Your Body mass index is 21.46 kg/m. If this is out of the aformentioned range listed, please consider follow up with your Primary Care Provider.    Please go to the lab in the basement of our building to have lab work done. Hit "B" for basement when you get on the elevator.  When the doors open the lab is on your left.  We will call you with the results. Thank you.  We have sent the following medications to your pharmacy for you to pick up at your convenience: Zofran 4mg  Orally dissolving tablet: Take every 8 hours as needed Carafate suspension: Take 32ml every 8 hours as needed  Continue to take Pepcid over the counter.  You will be due for a endoscopy in July 2020. We will call you or send you a reminder in the mail when it gets closer to that time.   Thank you for entrusting me with your care and for choosing [facility], Dr.  Cellar

## 2018-10-25 NOTE — Progress Notes (Signed)
THIS ENCOUNTER IS A VIRTUAL VISIT DUE TO COVID-19 - PATIENT WAS NOT SEEN IN THE OFFICE. PATIENT HAS CONSENTED TO VIRTUAL VISIT / TELEMEDICINE VISIT. THE PATIENT DID NOT HAVE A PHONE OR COMPUTER WITH CAMERA, SO ONLY AUDIO WAS USED VIA PHONE TO COMMUNICATE   Location of patient: home Location of provider: office Persons participating: myself, patient   HPI :  63 y/o female here for a follow up visit. I know her previously from screening colonoscopy on 04/17/2017 - she had a 78mm adenoma removed at the time.  She has been having some nausea which has been bothering her. She was given a trial of "famotidine" and then protonix 40mg . She felt worse on this regimen, felt like it set her on "fire". She felt somewhat better after stopping. She then starting taking pepcid OTC and states she felt better on this regimen.  Main complaint at this time is "burning" in the stomach and esophagus for about 3 weeks or so. She thinks it comes and goes. She reports coffee can make it worse and burns. Some regurgitation at times, not significant. She does not feel this much after she eats or at bedtime. She endorses some dysphagia which has bothered her. Mild odynophagia if she does not chew well. She thinks dysphagia started about 3 years ago. She thinks this occurs frequently. Only with solids, does not occur with liquids. She continues to have nausea routinely. It can occur at any time. No vomiting after she eats. No pains in her abdomen. No recent antibiotic use.   Her bowels are functioning well. She has some gas that is loud at times. She has some constipation at times, she has not had this recently however.   She has COPD with PFTs pending with pulmonary as well as scheduled echocardiogram which is pending. She reports continues to have some shortness of breath with exertion, on inhalers and following with pulmonary.   Patient has had some elevated liver enzymes over the years, ALT elevated to low 100s at  peak more recently, AP at 157, ALT 40. Prior hep B and C negative. No alcohol use.  Colonoscopy 04/17/2017 - Tortuous colon. - One 6 mm polyp in the sigmoid colon, removed with a cold snare. Resected and retrieved. - adenoma - Internal hemorrhoids. - The examination was otherwise normal.  EGD 2008, normal  CT scan 06/25/2018 - R adrenal adenoma 2.8cm, R ovarian cyst 3.2cm, normal liver   Past Medical History:  Diagnosis Date  . Anxiety   . Arthritis    osteo  . Bipolar 1 disorder ([place])   . Cervicalgia   . Chronic kidney disease    kidney stone  . COPD (chronic obstructive pulmonary disease) ([place])    new diagnosis- now sees pulmonologist  . Depression   . Elevated liver enzymes    She says "normal now"  . GERD (gastroesophageal reflux disease)   . H/O hiatal hernia   . Headache   . Hip fracture ([employer]) 2017   left  . History of kidney stones    4-5 yrs ago  . Hypertension   . Multiple sclerosis ([place])     Had for 15 years  . Multiple sclerosis ([place])    dx 1999  . Tremors of nervous system      Past Surgical History:  Procedure Laterality Date  . ANTERIOR CERVICAL DECOMP/DISCECTOMY FUSION N/A 07/28/2017   Procedure: CERVICAL FOUR-FIVE, CERVICAL FIVE-SIX ANTERIOR CERVICAL DECOMPRESSION/DISCECTOMY FUSION;  Surgeon: Eustace Moore, MD;  Location: [facility];  Service: Neurosurgery;  Laterality: N/A;  . COLONOSCOPY  2008   Dr.Kaplan  . EYE SURGERY     [facility]  . HIP PINNING,CANNULATED Left 11/09/2015   Procedure: CANNULATED HIP PINNING;  Surgeon: Rod Can, MD;  Location: [facility];  Service: Orthopedics;  Laterality: Left;  . LIPOMA EXCISION    . TUBAL LIGATION     Family History  Problem Relation Age of Onset  . Heart attack Mother   . Cancer Maternal Aunt   . Colon cancer Neg Hx   . Stomach cancer Neg Hx    Social History   Tobacco Use  . Smoking status: Former Smoker    Packs/day: 0.25    Years: 15.00    Pack years: 3.75    Types:  Cigarettes    Last attempt to quit: 01/25/1991    Years since quitting: 27.7  . Smokeless tobacco: Never Used  Substance Use Topics  . Alcohol use: No  . Drug use: No   Current Outpatient Medications  Medication Sig Dispense Refill  . albuterol (PROVENTIL HFA;VENTOLIN HFA) 108 (90 Base) MCG/ACT inhaler Inhale 2 puffs into the lungs every 4 (four) hours as needed for wheezing or shortness of breath. (Patient not taking: Reported on 10/24/2018) 1 Inhaler 1  . buPROPion (WELLBUTRIN SR) 200 MG 12 hr tablet TAKE 1 TABLET BY MOUTH TWICE A DAY 60 tablet 1  . citalopram (CELEXA) 40 MG tablet Take 1 tablet (40 mg total) by mouth daily. 30 tablet 5  . clonazePAM (KLONOPIN) 0.5 MG tablet TAKE 1/2 TABLET BY MOUTH NIGHTLY AT BEDTIME 30 tablet 0  . famotidine (PEPCID) 20 MG tablet Take 20 mg by mouth at bedtime.    . gabapentin (NEURONTIN) 800 MG tablet Take 400 mg by mouth 3 (three) times daily. Takes half tablet    . hydrochlorothiazide (HYDRODIURIL) 25 MG tablet Take 0.5 tablets (12.5 mg total) by mouth daily. 90 tablet 3  . ipratropium-albuterol (DUONEB) 0.5-2.5 (3) MG/3ML SOLN 1 neb every 6 hours as needed for help breathing 75 mL 12  . mirtazapine (REMERON) 15 MG tablet TAKE 0.5-1 TABLETS (7.5-15 MG TOTAL) BY MOUTH AT BEDTIME AS NEEDED. (Patient not taking: Reported on 10/24/2018) 30 tablet 1  . Nebulizers (COMPRESSOR/NEBULIZER) MISC Use as directed 1 each 0  . Oxycodone HCl 10 MG TABS Take 1 tablet (10 mg total) by mouth every 6 (six) hours as needed ((score 7 to 10)). 120 tablet 0  . pantoprazole (PROTONIX) 40 MG tablet TAKE 1 TABLET (40 MG TOTAL) BY MOUTH DAILY. TAKE 30-60 MIN BEFORE FIRST MEAL OF THE DAY (Patient not taking: Reported on 10/24/2018) 30 tablet 2  . primidone (MYSOLINE) 250 MG tablet Take 250 mg by mouth 4 (four) times daily.    . riTUXimab (RITUXAN) 100 MG/10ML injection Inject into the vein.    . tizanidine (ZANAFLEX) 2 MG capsule Take 2 mg by mouth daily.     Marland Kitchen  umeclidinium-vilanterol (ANORO ELLIPTA) 62.5-25 MCG/INH AEPB Inhale 1 puff into the lungs daily. (Patient not taking: Reported on 10/24/2018) 60 each 11  . valACYclovir (VALTREX) 1000 MG tablet TAKE 1 TABLET 2 TIMES DAILY. THEN STAY ON ONE TABLET DAILY AS PREVENTION (Patient taking differently: daily. ) 40 tablet 5   No current facility-administered medications for this visit.    Allergies  Allergen Reactions  . Hydrocodone Itching     Review of Systems: All systems reviewed and negative except where noted in HPI.   Lab Results  Component Value Date   WBC 7.0 10/16/2018   HGB 14.2 10/16/2018   HCT 41.1 10/16/2018   MCV 95 10/16/2018   PLT 287 10/16/2018    Lab Results  Component Value Date   CREATININE 0.93 10/16/2018   BUN 12 10/16/2018   NA 132 (L) 10/16/2018   K 4.2 10/16/2018   CL 87 (L) 10/16/2018   CO2 26 10/16/2018    Lab Results  Component Value Date   ALT 40 (H) 10/16/2018   AST 35 10/16/2018   ALKPHOS 157 (H) 10/16/2018   BILITOT <0.2 10/16/2018     Physical Exam: BP (!) 140/98   Wt 137 lb (62.1 kg) Comment: pt provided over the phone  BMI 21.46 kg/m  NA  ASSESSMENT AND PLAN: 63 y/o female here for reassessment of the following:  GERD / dysphagia / nausea - symptoms sound c/w GERD however she feels PPI made her significantly worse. Ongoing intermittent dysphagia and nausea as well. EGD is warranted to evaluate these symptoms, and we discussed risks / benefits and what it would entail. She is agreeable to do this but not doing well with her COPD and awaiting PFTs and an echo, recommend awaiting that evaluation first prior to putting her through endoscopy. Will give her some zofran to use PRN for the nausea, she will continue pepcid OTC (she reports she felt worse on prescribed  as well as PPIs), and will provide some liquid carafate to use and see if that helps. Will plan on EGD once she has completed her cardiopulmonary workup. She agreed.  Abnormal  liver enzymes - fluctuating ALT and AP over the years. Prior imaging of the liver did not show any steatosis. Will workup with some serologies, rule out autoimmune, viral hep, hemochromatosis, etc. Alpha one antitrypsin is normal. Will let her know results of labs with further recs.  Waimalu Cellar, MD [facility]

## 2018-10-26 ENCOUNTER — Other Ambulatory Visit: Payer: Self-pay

## 2018-10-26 ENCOUNTER — Telehealth: Payer: Self-pay | Admitting: Physician Assistant

## 2018-10-26 ENCOUNTER — Other Ambulatory Visit: Payer: Self-pay | Admitting: Physician Assistant

## 2018-10-26 MED ORDER — CLONAZEPAM 0.5 MG PO TABS: 0.5000 mg | ORAL_TABLET | Freq: Every evening | ORAL | 0 refills | Status: DC | PRN

## 2018-10-26 NOTE — Telephone Encounter (Signed)
Re: Klonopin  - She is asking on refill for 1 pill per day, not 1/2 because there are some days she needs to take 1 per day. [facility] [place].

## 2018-10-26 NOTE — Telephone Encounter (Signed)
Pharmacy aware and directions clarified and changed in medication list.

## 2018-10-26 NOTE — Telephone Encounter (Signed)
She stated that the bottle only says half a tablet at bedtime and that is not enough for 1 a day. She also stated that the pharmacy said she would have to wait until the 15th to get a refill. I asked her how much she took a day and she said she only takes the 1/2 at [facility] but does sometimes need 1 a day. Advice?

## 2018-10-26 NOTE — Telephone Encounter (Signed)
My mistake, the directions are 1/2 qhs.  BUT, I've given her 30 pills, last filled 3/17.  Please call the pharmacy and tell them it's okay to fill now.  And please change the directions on the Rx (and Rfs that are left) to be 1 po qd prn.   Thanks

## 2018-10-26 NOTE — Telephone Encounter (Signed)
Please let her know I already give her enough for 1 po qd.  That's what the RF are for.

## 2018-10-29 ENCOUNTER — Ambulatory Visit (INDEPENDENT_AMBULATORY_CARE_PROVIDER_SITE_OTHER): Payer: Medicare Other | Admitting: Psychiatry

## 2018-10-29 ENCOUNTER — Other Ambulatory Visit: Payer: Self-pay

## 2018-10-29 ENCOUNTER — Telehealth: Payer: Self-pay | Admitting: Physician Assistant

## 2018-10-29 DIAGNOSIS — F411 Generalized anxiety disorder: Secondary | ICD-10-CM | POA: Diagnosis not present

## 2018-10-29 NOTE — Telephone Encounter (Signed)
error 

## 2018-10-29 NOTE — Progress Notes (Signed)
[facility] Counselor/Therapist Progress Note  Patient ID: Angel Maddox, MRN: [id number],    Date: 10/29/2018  Time Spent: 60 minutes   11:00am to 12:00noon  Treatment Type: Individual Therapy   Virtual Visit Note I connected with patient by a video enabled telemedicine application or telephone, with their informed consent, and verified patient privacy and that I am speaking with the correct person using two identifiers.  I am at [place] and patient is at her home.  I discussed the limitations, risks, security and privacy concerns of performing psychotherapy and management service by telephone and the availability of in person appointments. I also discussed with the patient that there may be a patient responsible charge related to this service. The patient expressed understanding and agreed to proceed. I discussed the treatment planning with the patient. The patient was provided an opportunity to ask questions and all were answered. The patient agreed with the plan and demonstrated an understanding of the instructions. The patient was advised to call  our office if  symptoms worsen or feel they are in a crisis state and need immediate contact.  Reported Symptoms:   Anxiety and "worries" easily, anger, loneliness, some unresolved grief Mental Status Exam:  Appearance:   N/A  (telehealth)    Behavior:  Appropriate and Sharing  Motor:  Normal  Speech/Language:   Normal Rate  Affect:  N/A  (telehealth)  Mood:  Anxiety and some depressed mood  Thought process:  normal  Thought content:    WNL  Sensory/Perceptual disturbances:    WNL  Orientation:  oriented to person, place, time/date, situation, day of week, month of year and year  Attention:  Fair  Concentration:  Fair  Memory:  Fair, per patient  Fund of knowledge:   Good  Insight:    Fair  Judgment:   Good  Impulse Control:  Good   Risk Assessment: Danger to Self:  No Self-injurious Behavior: No Danger to  Others: No Duty to Warn:no Physical Aggression / Violence:No  Access to Firearms a concern: No  Gang Involvement:No   Subjective:     Patient reports symptoms noted above, and is struggling with all the social isolation and feeling lonely.  Still focusing on her medications and wondering if "I'll have to be on them forever."  But is glad to be feeling better than "when I started having panic and had to go back on my meds."   With Mother's Day approaching next Sunday, she was in touch with some unresolved grief about the loss of her son, [name].  We talked about this and how it impacts her at this point, and what might help her to manage her feelings to feel better able to "let go" more, and learning to live with her loss in ways that help her feel more empowered.  The lack of much good communication at home, complicates this for patient.  While they've had some good times, there's also times that they go a prolonged period of time (2-3 wks) without any talking, usually when he is angry with me.  This led into her picking back up on a topic she shared last session  re: husband not showing intimacy towards her.  States husband is not willing to see a marital counselor. As she spoke about this, she was tearful and angry.  Vented her anger and sadness as she feels trapped in some ways. Talked about different ways of communicating with husband to be better heard versus  him just hearing her anger.     For now her focus is to stay on her meds as prescribed and continue working  to change her view to where she is looking for what may go right versus wrong.   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   Interventions: Solution-focused  and Cognitive Behavioral therapy  Diagnosis:   ICD-10-CM   1. Generalized anxiety disorder F41.1     Plan:  Patient to work with some CBT strategies mentioned above as well as strategies to help  with anxiety, depression,  and situations that need re-framing.  Goal review and patient recognizing some of her progress. Next session in 2 wks.  [name], LCSW

## 2018-10-30 ENCOUNTER — Other Ambulatory Visit (INDEPENDENT_AMBULATORY_CARE_PROVIDER_SITE_OTHER): Payer: Medicare Other

## 2018-10-30 DIAGNOSIS — R1310 Dysphagia, unspecified: Secondary | ICD-10-CM | POA: Diagnosis not present

## 2018-10-30 DIAGNOSIS — R748 Abnormal levels of other serum enzymes: Secondary | ICD-10-CM

## 2018-10-30 DIAGNOSIS — R110 Nausea: Secondary | ICD-10-CM

## 2018-10-30 DIAGNOSIS — K219 Gastro-esophageal reflux disease without esophagitis: Secondary | ICD-10-CM

## 2018-10-30 LAB — IBC + FERRITIN
Ferritin: 25.8 ng/mL (ref 10.0–291.0)
Iron: 72 ug/dL (ref 42–145)
Saturation Ratios: 19.7 % — ABNORMAL LOW (ref 20.0–50.0)
Transferrin: 261 mg/dL (ref 212.0–360.0)

## 2018-10-30 LAB — GAMMA GT: GGT: 178 U/L — ABNORMAL HIGH (ref 7–51)

## 2018-10-31 ENCOUNTER — Telehealth: Payer: Self-pay | Admitting: Internal Medicine

## 2018-10-31 DIAGNOSIS — G35 Multiple sclerosis: Secondary | ICD-10-CM

## 2018-10-31 DIAGNOSIS — R0609 Other forms of dyspnea: Principal | ICD-10-CM

## 2018-10-31 NOTE — Telephone Encounter (Signed)
Ok to schedule PFT as available for dx dyspnea, multiple sclerosis

## 2018-10-31 NOTE — Telephone Encounter (Signed)
Order has been placed and a recall has been placed for pt to be called to have PFT scheduled when it was able to be performed. Called and spoke with pt letting her know that CY said it was okay for the PFT to be scheduled once able and pt verbalized understanding. Pt stated that an appt was made for her to see CY this Friday, 5/8 and pt wanted me to cancel the appt for her due to not going to come for that appt. appt has been cancelled. Nothing further needed.

## 2018-10-31 NOTE — Telephone Encounter (Signed)
  Called pt to assess sx from original phone: She says she had an episode yesterday of sob while out at the pharmacy. Pt wants to try to perform PFT next month. I told her we would check with Dr. Annamaria Boots to see if he want to order PFT. Pt tried to perform PFT previously without successful completion. She wants to find out what is going on with her lungs.  We would then give her a call back. Pt wishes to cancel appt that was scheduled for this coming Friday as she thought she was getting PFT.  Pasted from last OV with CY: Imaging suggest some degree of chronic bronchitis, possibly some emphysema. Natural course of this condition is gradual progression. I suspect there is a component of weakness and anxiety, related to her MS. When Covid status eases, we will try again for objective PFTs. If she is having some difficulty with inhalation/ exhalation, she might notice better treatment response with a nebulizer machine for aerosol meds. Echocardiogram will help exclude a cardiac component. Need to watch anemia and hyponatremia. If worse, these can contribute to weakness and malaise.

## 2018-11-01 ENCOUNTER — Telehealth: Payer: Self-pay | Admitting: Internal Medicine

## 2018-11-01 NOTE — Telephone Encounter (Signed)
I don't have any additional suggestions for now

## 2018-11-01 NOTE — Telephone Encounter (Signed)
Pt called office stating that 2 days ago, she had another attack with her breathing to where she felt like she was struggling to get a breath. Pt stated [facility] the [facility] happened and someone had to get her rescue inhaler out of the car. Pt stated after using her inhaler, she did have some relief.  Pt also stated all day yesterday, she felt like she was having a hard time breathing due to the attack she had the previous day. Pt has done a breathing treatment today and stated she has had some relief from it.  I asked pt if she felt like she was having a panic attack when this was going on and pt had no idea if it was all coming from a panic attack or if it was just worsening with her breathing. Dr. Annamaria Boots, please advise on this for pt. Thanks!

## 2018-11-01 NOTE — Telephone Encounter (Signed)
Pollen might be part of thee problem  Suggest she use her nebulizer machine treatments twice daily for the next few days, and see if this all gradually quiets back down.   It would be good idea to keep her rescue inhaler with her so she isn't caught without it if needed.

## 2018-11-01 NOTE — Telephone Encounter (Signed)
Spoke with pt she states she is already using the nebulizers 2-3 times a day already. She wanted me to send this message back to CY. SHe is wanting to know if there is anything else she can do?

## 2018-11-02 ENCOUNTER — Ambulatory Visit: Payer: Medicare Other | Admitting: Internal Medicine

## 2018-11-02 LAB — HEPATITIS B SURFACE ANTIGEN: Hepatitis B Surface Ag: NONREACTIVE

## 2018-11-02 LAB — ANTI-NUCLEAR AB-TITER (ANA TITER): ANA Titer 1: 1:160 {titer} — ABNORMAL HIGH

## 2018-11-02 LAB — HEPATITIS C ANTIBODY
Hepatitis C Ab: NONREACTIVE
SIGNAL TO CUT-OFF: 0.01 (ref <1.00)

## 2018-11-02 LAB — MITOCHONDRIAL ANTIBODIES: Mitochondrial M2 Ab, IgG: <=20 U

## 2018-11-02 LAB — ANTI-SMOOTH MUSCLE ANTIBODY, IGG: Actin (Smooth Muscle) Antibody (IGG): <20 U (ref <20)

## 2018-11-02 LAB — IGG: IgG (Immunoglobin G), Serum: 690 mg/dL (ref 600–1540)

## 2018-11-02 LAB — ANA: Anti Nuclear Antibody (ANA): POSITIVE — AB

## 2018-11-05 ENCOUNTER — Other Ambulatory Visit: Payer: Self-pay

## 2018-11-05 ENCOUNTER — Ambulatory Visit (INDEPENDENT_AMBULATORY_CARE_PROVIDER_SITE_OTHER): Payer: Medicare Other | Admitting: Physician Assistant

## 2018-11-05 ENCOUNTER — Encounter: Payer: Self-pay | Admitting: Physician Assistant

## 2018-11-05 DIAGNOSIS — F411 Generalized anxiety disorder: Secondary | ICD-10-CM | POA: Diagnosis not present

## 2018-11-05 DIAGNOSIS — F331 Major depressive disorder, recurrent, moderate: Secondary | ICD-10-CM

## 2018-11-05 DIAGNOSIS — G35 Multiple sclerosis: Secondary | ICD-10-CM

## 2018-11-05 DIAGNOSIS — G4700 Insomnia, unspecified: Secondary | ICD-10-CM | POA: Diagnosis not present

## 2018-11-05 MED ORDER — MIRTAZAPINE 15 MG PO TABS: 7.5000 mg | ORAL_TABLET | Freq: Every evening | ORAL | 5 refills | Status: DC | PRN

## 2018-11-05 MED ORDER — BUPROPION HCL ER (SR) 200 MG PO TB12: 200.0000 mg | ORAL_TABLET | Freq: Two times a day (BID) | ORAL | 5 refills | Status: DC

## 2018-11-05 MED ORDER — CLONAZEPAM 0.5 MG PO TABS: 0.5000 mg | ORAL_TABLET | Freq: Every evening | ORAL | 5 refills | Status: DC | PRN

## 2018-11-05 NOTE — Progress Notes (Signed)
[facility] Med Check  Patient ID: Angel Maddox,  MRN: [id number]  PCP: Terald Sleeper, PA-C  Date of Evaluation: 11/05/2018 Time spent:15 minutes  Chief Complaint:  Chief Complaint    Follow-up     Virtual Visit via Telephone Note  I connected with patient by a video enabled telemedicine application or telephone, with their informed consent, and verified patient privacy and that I am speaking with the correct person using two identifiers.  I am private, in my home and the patient is home.   I discussed the limitations, risks, security and privacy concerns of performing an evaluation and management service by telephone and the availability of in person appointments. I also discussed with the patient that there may be a patient responsible charge related to this service. The patient expressed understanding and agreed to proceed.   I discussed the assessment and treatment plan with the patient. The patient was provided an opportunity to ask questions and all were answered. The patient agreed with the plan and demonstrated an understanding of the instructions.   The patient was advised to call back or seek an in-person evaluation if the symptoms worsen or if the condition fails to improve as anticipated.  I provided 15 minutes of non-face-to-face time during this encounter.  HISTORY/CURRENT STATUS: HPI  For routine med check  She was last seen about 6 weeks ago.  We tried to wean down on the Celexa per her choice.  She was not able to tolerate it and we increased back to 40 mg.  She states she is doing pretty well overall.  If it was not for the coronavirus pandemic and the scare that is caused everyone, she feels that her medicines would be doing well.  She still has a lot of anxiety.  The Klonopin and hydroxyzine seem to be helpful.  She has been sleeping very well.  We started mirtazapine a few months back and it has been really helpful.  Patient denies loss of interest in  usual activities and is able to enjoy things.  Denies decreased energy or motivation.  Appetite has not changed.  No extreme sadness, tearfulness, or feelings of hopelessness.  Denies any changes in concentration, making decisions or remembering things.  Denies suicidal or homicidal thoughts.  No changes in musculoskeletal system.  Individual Medical History/ Review of Systems: Changes? :Yes A few weeks ago, she had numbness in her hands bilaterally, right side of her face and a few other places that would come and go.  This happened over several days.  Of time.  She contacted her neurologist and they gave her a short dose of steroids.  States she has had no further problems since then.  Past medications for mental health diagnoses include: Depakote, Lamictal, Risperdal, Celexa, Klonopin, trazodone, Wellbutrin, Vistaril, primidone, gabapentin, Prozac, Zyprexa, Ambien, Lunesta, Effexor  Allergies: Hydrocodone  Current Medications:  Current Outpatient Medications:  .  albuterol (PROVENTIL HFA;VENTOLIN HFA) 108 (90 Base) MCG/ACT inhaler, Inhale 2 puffs into the lungs every 4 (four) hours as needed for wheezing or shortness of breath., Disp: 1 Inhaler, Rfl: 1 .  buPROPion (WELLBUTRIN SR) 200 MG 12 hr tablet, Take 1 tablet (200 mg total) by mouth 2 (two) times daily., Disp: 60 tablet, Rfl: 5 .  citalopram (CELEXA) 40 MG tablet, Take 1 tablet (40 mg total) by mouth daily., Disp: 30 tablet, Rfl: 5 .  clonazePAM (KLONOPIN) 0.5 MG tablet, Take 1 tablet (0.5 mg total) by mouth at bedtime as needed for anxiety.,  Disp: 30 tablet, Rfl: 5 .  famotidine (PEPCID) 20 MG tablet, Take 20 mg by mouth at bedtime., Disp: , Rfl:  .  gabapentin (NEURONTIN) 800 MG tablet, Take 400 mg by mouth 3 (three) times daily. Takes half tablet, Disp: , Rfl:  .  hydrochlorothiazide (HYDRODIURIL) 25 MG tablet, Take 0.5 tablets (12.5 mg total) by mouth daily., Disp: 90 tablet, Rfl: 3 .  ipratropium-albuterol (DUONEB) 0.5-2.5 (3)  MG/3ML SOLN, 1 neb every 6 hours as needed for help breathing, Disp: 75 mL, Rfl: 12 .  mirtazapine (REMERON) 15 MG tablet, Take 0.5-1 tablets (7.5-15 mg total) by mouth at bedtime as needed., Disp: 30 tablet, Rfl: 5 .  Nebulizers (COMPRESSOR/NEBULIZER) MISC, Use as directed, Disp: 1 each, Rfl: 0 .  Oxycodone HCl 10 MG TABS, Take 1 tablet (10 mg total) by mouth every 6 (six) hours as needed ((score 7 to 10))., Disp: 120 tablet, Rfl: 0 .  pantoprazole (PROTONIX) 40 MG tablet, TAKE 1 TABLET (40 MG TOTAL) BY MOUTH DAILY. TAKE 30-60 MIN BEFORE FIRST MEAL OF THE DAY, Disp: 30 tablet, Rfl: 2 .  primidone (MYSOLINE) 250 MG tablet, Take 250 mg by mouth 4 (four) times daily., Disp: , Rfl:  .  riTUXimab (RITUXAN) 100 MG/10ML injection, Inject into the vein., Disp: , Rfl:  .  sucralfate (CARAFATE) 1 GM/10ML suspension, Take 10 mLs (1 g total) by mouth every 8 (eight) hours as needed., Disp: 420 mL, Rfl: 3 .  tizanidine (ZANAFLEX) 2 MG capsule, Take 2 mg by mouth daily. , Disp: , Rfl:  .  umeclidinium-vilanterol (ANORO ELLIPTA) 62.5-25 MCG/INH AEPB, Inhale 1 puff into the lungs daily., Disp: 60 each, Rfl: 11 .  valACYclovir (VALTREX) 1000 MG tablet, TAKE 1 TABLET 2 TIMES DAILY. THEN STAY ON ONE TABLET DAILY AS PREVENTION (Patient taking differently: daily. ), Disp: 40 tablet, Rfl: 5 .  ondansetron (ZOFRAN ODT) 4 MG disintegrating tablet, Take 1 tablet (4 mg total) by mouth every 8 (eight) hours as needed for nausea or vomiting. (Patient not taking: Reported on 11/05/2018), Disp: 30 tablet, Rfl: 2 Medication Side Effects: none  Family Medical/ Social History: Changes? Yes isolating d/t coronavirus.  MENTAL HEALTH EXAM:  There were no vitals taken for this visit.There is no height or weight on file to calculate BMI.  General Appearance: unable to assess  Eye Contact:  unable to assess  Speech:  Clear and Coherent  Volume:  Normal  Mood:  Euthymic  Affect:  unable to assess  Thought Process:  Goal Directed   Orientation:  Full (Time, Place, and Person)  Thought Content: Logical   Suicidal Thoughts:  No  Homicidal Thoughts:  No  Memory:  WNL  Judgement:  Good  Insight:  Good  Psychomotor Activity:  unable to assess  Concentration:  Concentration: Good  Recall:  Good  Fund of Knowledge: Good  Language: Good  Assets:  Desire for Improvement  ADL's:  Intact  Cognition: WNL  Prognosis:  Good    DIAGNOSES:    ICD-10-CM   1. Generalized anxiety disorder F41.1   2. Major depressive disorder, recurrent episode, moderate (HCC) F33.1   3. Insomnia, unspecified type G47.00   4. MS (multiple sclerosis) ([place]) G35     Receiving Psychotherapy: Yes with [name], LCSW  RECOMMENDATIONS:  Continue Wellbutrin SR 200 mg 1 twice daily. Continue Celexa 40 mg every morning. Continue Klonopin 0.5 mg nightly as needed. Continue gabapentin 400 mg 3 times daily, per neurology. Continue mirtazapine 15 mg 1/2-1 p.o.  nightly as needed sleep. Continue psychotherapy with [name], LCSW. Return in 3 months or sooner as needed.   Donnal Moat, PA-C   This record has been created using [employer].  Chart creation errors have been sought, but may not always have been located and corrected. Such creation errors do not reflect on the standard of medical care.

## 2018-11-06 NOTE — Telephone Encounter (Signed)
Called and spoke with patient regarding CY recommendations below Pt advised that she is feeling chest tightness, wheezing and SOB Pt is using neb treatments 3 times daily, some relief still have trouble breathing well Scheduled televisit with CY for 11/08/18 at 930am Pt verbalized understanding Nothing further needed.

## 2018-11-07 ENCOUNTER — Telehealth: Payer: Self-pay | Admitting: Gastroenterology

## 2018-11-07 ENCOUNTER — Other Ambulatory Visit: Payer: Self-pay

## 2018-11-07 DIAGNOSIS — R748 Abnormal levels of other serum enzymes: Secondary | ICD-10-CM

## 2018-11-07 NOTE — Telephone Encounter (Signed)
Called patient and gave Dr. Doyne Keel recommendation for Gaviscon OTC prn. Also let her know we would be doing a f/u LFT in 3 months.

## 2018-11-07 NOTE — Telephone Encounter (Signed)
Patient called and states for about 4 days now she has bad stomach burning after eating and frequent nausea anytime of day. No dark colored or blood in stool. Is taking the Carafate 2 times a day and O.T.C. Pepcid daily. The Protonix had been stopped. She did have 2 cups of coffee today, which set it off. I suggested she back off the coffee. Please advise

## 2018-11-07 NOTE — Telephone Encounter (Signed)
Thanks Sherlynn Stalls,  We have been wanting to do an EGD to further evaluate her symptoms however have been awaiting her echocardiogram and PFTs to be done first in regards to her shortness of breath. I think those need to be done before scheduling EGD to further evaluate. She can try adding Gaviscon OTC PRN to see if that will help her symptoms. We had stopped PPI as she felt significantly worse on that regimen. Agreed she should avoid trigger foods otherwise.  Finally, we did a workup for elevated liver enzymes. Serologic workup negative. Prior imaging with CT did not show much. Recommend repeating LFTs in 3 months to see where they trend if you can let her know. Thanks

## 2018-11-08 ENCOUNTER — Other Ambulatory Visit: Payer: Self-pay

## 2018-11-08 ENCOUNTER — Encounter: Payer: Self-pay | Admitting: Internal Medicine

## 2018-11-08 ENCOUNTER — Ambulatory Visit (INDEPENDENT_AMBULATORY_CARE_PROVIDER_SITE_OTHER): Payer: Medicare Other | Admitting: Internal Medicine

## 2018-11-08 DIAGNOSIS — G35 Multiple sclerosis: Secondary | ICD-10-CM

## 2018-11-08 DIAGNOSIS — K2100 Gastro-esophageal reflux disease with esophagitis, without bleeding: Secondary | ICD-10-CM

## 2018-11-08 DIAGNOSIS — R0609 Other forms of dyspnea: Secondary | ICD-10-CM

## 2018-11-08 NOTE — Progress Notes (Signed)
HPI  63 yo F former smoker followed for asthma/ COPD, dyspnea, complicated by Multiple Sclerosis,  GERD, Chronic constipation,  Osteoporosis, Depression/ Anxiety, Malnutrition, Hyponatremia, abnormal LFTs  ----------------------------------------------------------------------------  HPI4/23/2020- 63 yo F former smoker FOLLOWS FOR: Former [facility] pt, switching to [employer]. Reports having issues with shortness of breath, chest congestion, coughing.  Previous eval by Dr Melvyn Novas- notes reviewed. His impression was IBS. Medical problem list includes COPD, GERD, Chronic constipation, Multiple Sclerosis, Osteoporosis, Depression/ Anxiety, Malnutrition, Hyponatremia, abnormal LFTs Medication includes Anoro, albuterol hfa, clonazepam, gabapentin, welbutrin, citalopram, mirtazapine, provigil,   C/O dyspnea with sense of chest congestion but minimal white sputum. Onset about 1.5 years ago, no definite trigger remembered, gradually worse. Feels something should come out, but dry cough.Symbicort initially helped, but not any longer. Rescue inhaler helps some. Change to Anoro w/o benefit.  Ok supine and breathing doesn't wake her- this is mostly DOE w/ ADLs. Denies heart or prior lung condition. Remote former smoker. Not able to perform PFT maneuvers on initial trial and wants to try again when Covid rules allow.  Has not had cardiac w/u. Pror physical therapy - little effect.  Uses rolling walker due to MS. Her MS neurologist, Dr Gorden Harms, has moved several times and she has followed him. He told her didn't think symptoms were due to MS.   CTa chest 06/05/18  increased bronchial thickening s emphysema - unable to perform pfts 07/03/18 - 09/17/2018   Walked RA x one lap =  approx 250 ft - stopped due to  Sob with sats high 90's and obvious breath holding then gasping expiratory efforts -Quit smoking 1992 with onset doe 2018 in setting of MS/ anxiety  - CTa chest 06/05/18  increased bronchial thickening s emphysema -  Spirometry 07/03/2018  Not physiologic - 07/03/2018   Walked RA x 10 ft - stopped due to sob and sats 89%   - 07/03/2018 alpha one screen   MS  Level 111  - 08/02/2018   symbicort 80 2bid  - 09/17/2018  After extensive coaching inhaler device,  effectiveness =    75% from a baseline of around 25 % > continue symbicort/ consider trial of spiriva next step     CXR 09/17/2018--Emphysematous changes without infiltrate.- CBC 10/16/2018- WNL CMP 10/16/2018- elevated transaminases, Na 132   ROS-see HPI   + = positive Constitutional:    weight loss, night sweats, fevers, chills, fatigue, lassitude. HEENT:    headaches, difficulty swallowing, tooth/dental problems, sore throat,       sneezing, itching, ear ache, nasal congestion, post nasal drip, snoring CV:    chest pain, orthopnea, PND, swelling in lower extremities, anasarca,                                  dizziness, palpitations Resp:   +shortness of breath with exertion or at rest.                +productive cough,   +non-productive cough, coughing up of blood.              change in color of mucus.  wheezing.   Skin:    rash or lesions. GI:  No-   heartburn, indigestion, +abdominal pain, nausea, vomiting, diarrhea,                 change in bowel habits, loss of appetite GU: dysuria, change in color of urine, no urgency or frequency.  flank pain. MS:   joint pain, stiffness, decreased range of motion, back pain. Neuro-   + MS Psych:  change in mood or affect.  depression or +anxiety.   memory loss.  OBJ- Physical Exam General- Alert, Oriented, Affect-appropriate, Distress- none acute, + rolling walker Skin- rash-none, lesions- none, excoriation- none Lymphadenopathy- none Head- atraumatic            Eyes- Gross vision intact, PERRLA, conjunctivae and secretions clear            Ears- Hearing, canals-normal            Nose- Clear, no-Septal dev, mucus, polyps, erosion, perforation             Throat- Mallampati II , mucosa clear , drainage-  none, tonsils- atrophic Neck- flexible , trachea midline, no stridor , thyroid nl, carotid no bruit Chest - symmetrical excursion , unlabored           Heart/CV- RRR , no murmur , no gallop  , no rub, nl s1 s2                           - JVD- none , edema- none, stasis changes- none, varices- none           Lung- clear to P&A, wheeze- none, cough- none , dullness-none, rub- none           Chest wall-  Abd-  Br/ Gen/ Rectal- Not done, not indicated Extrem- cyanosis- none, clubbing, none, atrophy- none, strength- nl Neuro- grossly intact to observation  11/08/2018- Virtual Visit via Telephone Note  I connected with Angel Maddox on 11/08/18 at  9:30 AM EDT by telephone and verified that I am speaking with the correct person using two identifiers.  Location: Patient: home Provider: office   I discussed the limitations, risks, security and privacy concerns of performing an evaluation and management service by telephone and the availability of in person appointments. I also discussed with the patient that there may be a patient responsible charge related to this service. The patient expressed understanding and agreed to proceed.   History of Present Illness:  63 yo F former smoker followed for asthma, dyspnea, complicated by Multiple Sclerosisi, Anxiety Medical problem list includes COPD, GERD, Chronic constipation, Multiple Sclerosis, Osteoporosis, Depression/ Anxiety, Malnutrition, Hyponatremia, abnormal LFTs Medication includes  albuterol hfa, Neb         clonazepam, gabapentin, welbutrin, citalopram, mirtazapine, provigil,  Still pending repeat PFT (for better effort) and Echo -----Tele-Visit re: Patient reports increased SOB worse than her normal SOB. She reports a non-productive cough. She states she does have some chest soreness along her right shoulder blade. She states she is using her nebulizer which has helped. Anoro stopped helping. During flare needed neb 3x/d- had bad cough,  chills, no fever, sore across chest and R shoulder blade. Reflux- seeing GI, pending endoscopy Due to MS transportation difficult to get here.   Observations/Objective: CXR 09/17/2018- Emphysematous changes without infiltrate. Dry cough heard over phone.  Assessment and Plan: Asthma/ COPD- difficult to separate from MS weakness. MS- follows Neuro Reflux- follows GI. Aspiration risk. Plan- elevate HOB Follow Up Instructions: 4 months   I discussed the assessment and treatment plan with the patient. The patient was provided an opportunity to ask questions and all were answered. The patient agreed with the plan and demonstrated an understanding of the instructions.   The patient was advised to call back  or seek an in-person evaluation if the symptoms worsen or if the condition fails to improve as anticipated.  I provided 21 minutes of non-face-to-face time during this encounter.   Baird Lyons, MD

## 2018-11-08 NOTE — Patient Instructions (Signed)
We will get the PFT and Echocardiogram done when we can.  You can use the nebulizer machine up to 4 times daily if needed  If you are ever able to come by this office before your next visit, you can pick up 2 samples of Trelegy inhaler to try     Inhale 1 puff, then rinse mouth well, once daily to see if this helps your breathing any.  Please call if we can help

## 2018-11-13 ENCOUNTER — Ambulatory Visit (INDEPENDENT_AMBULATORY_CARE_PROVIDER_SITE_OTHER): Payer: Medicare Other | Admitting: Psychiatry

## 2018-11-13 ENCOUNTER — Telehealth: Payer: Self-pay | Admitting: Physician Assistant

## 2018-11-13 ENCOUNTER — Other Ambulatory Visit: Payer: Self-pay

## 2018-11-13 DIAGNOSIS — F411 Generalized anxiety disorder: Secondary | ICD-10-CM

## 2018-11-13 NOTE — Progress Notes (Signed)
[facility] Counselor/Therapist Progress Note  Patient ID: Angel Maddox, MRN: [id number],    Date: 11/13/2018  Time Spent: 60 minutes   1:00pm to 2:00pm  Treatment Type: Individual Therapy   Virtual Visit Note I connected with patient by a video enabled telemedicine application or telephone, with their informed consent, and verified patient privacy and that I am speaking with the correct person using two identifiers.  I am at [place] and patient is at her home.  I discussed the limitations, risks, security and privacy concerns of performing psychotherapy and management service by telephone and the availability of in person appointments. I also discussed with the patient that there may be a patient responsible charge related to this service. The patient expressed understanding and agreed to proceed. I discussed the treatment planning with the patient. The patient was provided an opportunity to ask questions and all were answered. The patient agreed with the plan and demonstrated an understanding of the instructions. The patient was advised to call  our office if  symptoms worsen or feel they are in a crisis state and need immediate contact.  Reported Symptoms:   Anxiety and "worries" easily, anger, hurt,  loneliness, some unresolved grief Mental Status Exam:  Appearance:   N/A  (telehealth)    Behavior:  Appropriate and Sharing  Motor:  Normal  Speech/Language:   Normal Rate  Affect:  N/A  (telehealth)  Mood:  Anxiety and some depressed mood  Thought process:  normal  Thought content:    WNL  Sensory/Perceptual disturbances:    WNL  Orientation:  oriented to person, place, time/date, situation, day of week, month of year and year  Attention:  Fair  Concentration:  Fair  Memory:  Fair, per patient  Fund of knowledge:   Good  Insight:    Fair  Judgment:   Good  Impulse Control:  Good   Risk Assessment: Danger to Self:  No Self-injurious Behavior: No Danger  to Others: No Duty to Warn:no Physical Aggression / Violence:No  Access to Firearms a concern: No  Gang Involvement:No   Subjective:     Patient reports symptoms noted above, and explains that her and her husband's relationship has been very problematic recently to the point husband told her "she could just leave and get out."  Patient has remained in the home.  Husband has also remained in the home but not having much interaction with patient, keeping to himself mostly in the back of the house.  Patient shares that she spoke with her attorney and was advised to " remain at home because husband could not make her get out." Patient processed her feelings about their marriage in detail with anxiety, anger, hurt, and apprehension. She is going to talk with husband this week and ask about marital counseling with a different therapist.  She doesn't think that he'll be interested but is agreeing to ask him.  Discussed the need for her to practice good self-care (physically and emotionally) as she gets through this time of concerns about marriage and also the whole coronavirus (fear of illness and isolation) with patient being at risk due to her other health issues.   For now her focus is to stay on her meds as prescribed and continue working  to change her view to where she is looking for what may go right versus wrong.   Encouraged overall self-care and following all the coronavirus precautions.     Interventions: Solution-focused  and Cognitive  Behavioral therapy  Diagnosis:   ICD-10-CM   1. Generalized anxiety disorder F41.1     Plan:  Patient to work with some CBT strategies mentioned above as well as strategies to help with anxiety, depression,  and situations that need re-framing.  Goal review with patient.  Next session in 2 wks.  [name], LCSW

## 2018-11-13 NOTE — Telephone Encounter (Signed)
Pt is not needing home health, she is needing personal type care assistance. Mailed a list of sitters that we have that maybe one of them can help her out as well as the flyer from [facility] [facility] [place].

## 2018-11-20 ENCOUNTER — Telehealth: Payer: Self-pay | Admitting: Physician Assistant

## 2018-11-20 NOTE — Telephone Encounter (Signed)
Patient received in mail today.

## 2018-11-21 ENCOUNTER — Other Ambulatory Visit: Payer: Self-pay

## 2018-11-21 ENCOUNTER — Ambulatory Visit (INDEPENDENT_AMBULATORY_CARE_PROVIDER_SITE_OTHER): Payer: Medicare Other | Admitting: Psychiatry

## 2018-11-21 ENCOUNTER — Telehealth: Payer: Self-pay | Admitting: Gastroenterology

## 2018-11-21 DIAGNOSIS — F411 Generalized anxiety disorder: Secondary | ICD-10-CM | POA: Diagnosis not present

## 2018-11-21 NOTE — Telephone Encounter (Signed)
Patient would like to know if she can get a refill for ondansetron (ZOFRAN ODT) 4 MG if that is what Dr. Havery Moros wants fort her to do.

## 2018-11-21 NOTE — Progress Notes (Signed)
[facility] Counselor/Therapist Progress Note  Patient ID: Angel Maddox, MRN: [id number],    Date: 11/21/2018  Time Spent: 60 minutes  3:00pm to 4:00pm  Treatment Type: Individual Therapy   Virtual Visit Note I connected with patient by a video enabled telemedicine application or telephone, with their informed consent, and verified patient privacy and that I am speaking with the correct person using two identifiers.  I am at [place] and patient is at her home.  I discussed the limitations, risks, security and privacy concerns of performing psychotherapy and management service by telephone and the availability of in person appointments. I also discussed with the patient that there may be a patient responsible charge related to this service. The patient expressed understanding and agreed to proceed. I discussed the treatment planning with the patient. The patient was provided an opportunity to ask questions and all were answered. The patient agreed with the plan and demonstrated an understanding of the instructions. The patient was advised to call  our office if  symptoms worsen or feel they are in a crisis state and need immediate contact.  Reported Symptoms:   Anxiety and "worries" easily, anger, hurt,  Loneliness, communication issues with husband  Mental Status Exam:  Appearance:   N/A  (telehealth)    Behavior:  Appropriate and Sharing  Motor:  Normal  (per patient)   (teletherapy)  Speech/Language:   Normal Rate  Affect:  N/A  (telehealth)  Mood:  Anxiety and some depressed mood  Thought process:  normal  Thought content:    WNL  Sensory/Perceptual disturbances:    WNL  Orientation:  oriented to person, place, time/date, situation, day of week, month of year and year  Attention:  Fair  Concentration:  Fair  Memory:  Fair, per patient  Fund of knowledge:   Good  Insight:    Fair  Judgment:   Good  Impulse Control:  Good   Risk Assessment: Danger to  Self:  No Self-injurious Behavior: No Danger to Others: No Duty to Warn:no Physical Aggression / Violence:No  Access to Firearms a concern: No  Gang Involvement:No   Subjective:    Follow up on the relationship issues with patient and husband. Had several days since last appt where they weren't speaking much, and when they did speak, it tended to be difficult.  States the past couple days it has been better and husband seemed more attentive. We discussed better communication skills including active listening, not interrupting the other person, and paying attention to how we say what we say. Gave her examples and we practiced this some together and she seemed to understand what a difference it makes, especially as to how we say what we say.   Reviewed with her the need to practice good self-care (physically and emotionally) as she gets through this time of concerns about marriage (although a bit better past 2 days) and also the whole coronavirus (fear of illness and isolation) with patient being at risk due to her other health issues. For now her focus is to stay on her meds as prescribed and continue working  to change her view from looking for what may go right versus wrong.   Encouraged overall self-care and following all the coronavirus precautions.     Interventions: Solution-focused  and Cognitive Behavioral therapy  Diagnosis:   ICD-10-CM   1. Generalized anxiety disorder F41.1     Plan:  Patient to work with some CBT strategies mentioned above  as well as strategies to help with anxiety, depression, and situations that need re-framing.  Goal review with patient.  Next session in 1-2 wks.  [name], LCSW

## 2018-11-22 NOTE — Telephone Encounter (Signed)
Pt needs a refill for her Zofran

## 2018-11-22 NOTE — Telephone Encounter (Signed)
Called and spoke to pharmacy. She has refills remaining.  pharmacy will refill and inform the pt.

## 2018-11-26 ENCOUNTER — Other Ambulatory Visit: Payer: Self-pay | Admitting: Physician Assistant

## 2018-11-26 DIAGNOSIS — J209 Acute bronchitis, unspecified: Secondary | ICD-10-CM

## 2018-11-26 DIAGNOSIS — I959 Hypotension, unspecified: Secondary | ICD-10-CM

## 2018-11-27 ENCOUNTER — Ambulatory Visit (INDEPENDENT_AMBULATORY_CARE_PROVIDER_SITE_OTHER): Payer: Medicare Other | Admitting: Psychiatry

## 2018-11-27 ENCOUNTER — Other Ambulatory Visit: Payer: Self-pay

## 2018-11-27 DIAGNOSIS — F411 Generalized anxiety disorder: Secondary | ICD-10-CM | POA: Diagnosis not present

## 2018-11-27 NOTE — Progress Notes (Signed)
[facility] Counselor/Therapist Progress Note  Patient ID: QUINTINA HAKEEM, MRN: [id number],    Date: 11/27/2018  Time Spent: 60 minutes  1:00pm to 2:00pm  Treatment Type: Individual Therapy   Virtual Visit Note I connected with patient by a video enabled telemedicine application or telephone, with their informed consent, and verified patient privacy and that I am speaking with the correct person using two identifiers.  I am at [place] and patient is at her home.  I discussed the limitations, risks, security and privacy concerns of performing psychotherapy and management service by telephone and the availability of in person appointments. I also discussed with the patient that there may be a patient responsible charge related to this service. The patient expressed understanding and agreed to proceed. I discussed the treatment planning with the patient. The patient was provided an opportunity to ask questions and all were answered. The patient agreed with the plan and demonstrated an understanding of the instructions. The patient was advised to call  our office if  symptoms worsen or feel they are in a crisis state and need immediate contact.  Reported Symptoms:   Anxiety and "worries" easily, anger, hurt,  Loneliness, communication issues with husband  Mental Status Exam:  Appearance:   N/A  (telehealth)    Behavior:  Appropriate and Sharing  Motor:  Normal  (per patient)   (teletherapy)  Speech/Language:   Normal Rate  Affect:  N/A  (telehealth)  Mood:  Anxiety and some depressed mood, irritability  Thought process:  normal  Thought content:    WNL  Sensory/Perceptual disturbances:    WNL  Orientation:  oriented to person, place, time/date, situation, day of week, month of year and year  Attention:  Fair  Concentration:  Fair  Memory:  Fair, per patient  Fund of knowledge:   Good  Insight:    Fair  Judgment:   Good  Impulse Control:  Good   Risk  Assessment: Danger to Self:  No Self-injurious Behavior: No Danger to Others: No Duty to Warn:no Physical Aggression / Violence:No  Access to Firearms a concern: No  Gang Involvement:No   Subjective:    Patient reports the symptoms listed above.  Still struggling with her relationships issues with her husband and shares that he is not wanting marital counseling but is committed to being with patient.  Most of the problems seem to stem from their behavior and communication with each other. Patient talked at length today about their communication, mostly "his communication towards her" and how she ends up being hurt. Looked at a couple ways patient may be able to impact their communication more positively.  Patient quite irritable today and she shared that her MS pain is worse today. Also shared her concerns and fears about "how I'm going to be with my MS in the future", which patient processed in more detail.  Seemed to have some relief from just talking it out some.  Also reviewed strategies that can help her cope more effectively with her physical issues as well as the emotional side of it as well.   Reviewed with her the need to practice good self-care (physically and emotionally) as she gets through this time of concerns about marriage) and also the whole coronavirus (fear of illness and isolation) with patient being at risk due to her other health issues. For now her focus is to stay on her meds as prescribed and continue working  to change her view from looking  for what may go right versus wrong and also practice some better communication skills which we discussed today.  Encouraged overall self-care again and following all the coronavirus precautions.     Interventions: Solution-focused  and Cognitive Behavioral therapy  Diagnosis:   ICD-10-CM   1. Generalized anxiety disorder F41.1     Plan:  Patient to work with some CBT strategies mentioned above as well as other strategies we've  discussed to help with anxiety, depression, communicating with husband, and situations that need re-framing.  Goal review with patient.  Next session in 1-2 wks.  [name], LCSW

## 2018-12-04 ENCOUNTER — Other Ambulatory Visit: Payer: Self-pay | Admitting: Gastroenterology

## 2018-12-04 ENCOUNTER — Telehealth: Payer: Self-pay | Admitting: Gastroenterology

## 2018-12-04 NOTE — Telephone Encounter (Signed)
Called pt and asked her to call the pharmacy b/c they had told me 2 weeks ago she still had refills.  She will call pharmacy and if she is out of refills she will ask them to send refill request electronically.

## 2018-12-04 NOTE — Telephone Encounter (Signed)
Pt requested a refill for ondansetron ODT sent to [facility].  Pt reported that she is still having burning sensation in her stomach without pain.

## 2018-12-05 NOTE — Telephone Encounter (Signed)
Script sent  

## 2018-12-17 ENCOUNTER — Encounter

## 2018-12-17 ENCOUNTER — Other Ambulatory Visit: Payer: Self-pay

## 2018-12-17 ENCOUNTER — Ambulatory Visit (INDEPENDENT_AMBULATORY_CARE_PROVIDER_SITE_OTHER): Payer: Medicare Other | Admitting: Psychiatry

## 2018-12-17 DIAGNOSIS — F411 Generalized anxiety disorder: Secondary | ICD-10-CM | POA: Diagnosis not present

## 2018-12-17 NOTE — Progress Notes (Signed)
[facility] Counselor/Therapist Progress Note  Patient ID: Angel Maddox, MRN: [id number],    Date: 12/17/2018  Time Spent: 60 minutes  1:00pm to 2:00pm  Treatment Type: Individual Therapy    Any new medication changes or health concerns since last appt: Patient reports "none".   Reported Symptoms:   Anxiety and "worries" easily, anger, hurt,  Loneliness, communication issues with husband, emotionally struggles with her MS  Mental Status Exam:  Appearance:   casual  Behavior:  Appropriate and Sharing  Motor:  Normal  (except symptoms related to her MS)  Speech/Language:   Normal Rate  Affect:  Anxious, frustrated  Mood:  Anxiety and some depressed mood, irritability  Thought process:  normal  Thought content:    WNL  Sensory/Perceptual disturbances:    WNL  Orientation:  oriented to person, place, time/date, situation, day of week, month of year and year  Attention:  Fair  Concentration:  Fair  Memory:  Fair, per patient  Fund of knowledge:   Good  Insight:    Fair  Judgment:   Good  Impulse Control:  Good   Risk Assessment: Danger to Self:  No Self-injurious Behavior: No Danger to Others: No Duty to Warn:no Physical Aggression / Violence:No  Access to Firearms a concern: No  Gang Involvement:No   Subjective:    Patient in today reporting symptoms noted above. Is struggling emotionally as well as physically with her MS. Talked about her frustration, fears, frustration with symptoms and medical community at times.  Having some breathing issues that she is being seen for and to have some eventual testing done per pulmonologist Dr. Keturah Barre and has appt July 7.  Patient states another one of her doctors said that her breathing issues were not related to her MS.   Still having some communication issues with husband but their behavior towards each other "has not been as bad". They went for a short trip to beach recently and reports that went ok and "we are  usually good when we travel to beach."  At other times, "I feel distance from him and he refuses to be seen in marital therapy." Talks some about some possible options for her in the future.  Would like to be closer to her brother.    Patient updated today about their communication, mostly "his communication towards her" and how she ends up being hurt. Reviewed a couple ways patient may be able to impact their communication more positively.  Also shared her concerns and fears about "how I'm going to be with my MS in the future", which patient processed in more detail.  Seemed to have some relief from just talking it out some.  Also reviewed strategies that can help her cope more effectively with her physical issues as well as the emotional side of it as well.  Later did state "some days are better than others."   Reviewed with her the need to practice good self-care (physically and emotionally) as she gets through this time of concerns about marriage) and also the whole coronavirus (fear of illness and isolation) with patient being at risk due to her other health issues. For now her focus is to stay on her meds as prescribed and continue working  to change her view from looking for what may go right versus wrong and also practice some better communication skills which we discussed today.  Encouraged overall self-care again and following all the coronavirus precautions.     Interventions:  Solution-focused  and Cognitive Behavioral therapy  Diagnosis:   ICD-10-CM   1. Generalized anxiety disorder  F41.1     Plan:  Patient to work with some CBT strategies mentioned above as well as other strategies we've discussed to help with anxiety, depression, communicating with husband, and situations that need re-framing.  Goal review with patient.  Next session in 1-2 wks.  [name], LCSW

## 2018-12-18 ENCOUNTER — Ambulatory Visit (INDEPENDENT_AMBULATORY_CARE_PROVIDER_SITE_OTHER): Payer: Medicare Other | Admitting: Family Medicine

## 2018-12-18 DIAGNOSIS — R059 Cough, unspecified: Secondary | ICD-10-CM

## 2018-12-18 DIAGNOSIS — R05 Cough: Secondary | ICD-10-CM | POA: Diagnosis not present

## 2018-12-18 MED ORDER — BENZONATATE 200 MG PO CAPS: 200.0000 mg | ORAL_CAPSULE | Freq: Two times a day (BID) | ORAL | 0 refills | Status: DC | PRN

## 2018-12-18 NOTE — Progress Notes (Signed)
Telephone visit  Subjective: SW:FUXNA PCP: Terald Sleeper, PA-C TFT:DDUKGUR C Knighton is a 63 y.o. female calls for telephone consult today. Patient provides verbal consent for consult held via phone.  Location of patient: home Location of provider: Working remotely from home Others present for call: none  1. Cough Patient reports a 2-day history of worsening coughing.  She reports associated chest congestion but is unable to produce any mucus.  She has chronic shortness of breath over the last 2 years.  Denies any hemoptysis, chills, fevers.  She does report a headache over the last 2 days.  She has been using her inhalers as directed with little improvement in symptoms.  She did use a cough syrup today which seemed to help with the cough but did not resolve it.   ROS: Per HPI  Allergies  Allergen Reactions  . Hydrocodone Itching   Past Medical History:  Diagnosis Date  . Anxiety   . Arthritis    osteo  . Bipolar 1 disorder ([place])   . Cervicalgia   . Chronic kidney disease    kidney stone  . COPD (chronic obstructive pulmonary disease) ([place])    new diagnosis- now sees pulmonologist  . Depression   . Elevated liver enzymes    She says "normal now"  . GERD (gastroesophageal reflux disease)   . H/O hiatal hernia   . Headache   . Hip fracture ([place]) 2017   left  . History of kidney stones    4-5 yrs ago  . Hypertension   . Multiple sclerosis ([place])     Had for 15 years  . Multiple sclerosis ([place])    dx 1999  . Tremors of nervous system     Current Outpatient Medications:  .  benzonatate (TESSALON) 200 MG capsule, Take 1 capsule (200 mg total) by mouth 2 (two) times daily as needed for cough., Disp: 20 capsule, Rfl: 0 .  buPROPion (WELLBUTRIN SR) 200 MG 12 hr tablet, Take 1 tablet (200 mg total) by mouth 2 (two) times daily., Disp: 60 tablet, Rfl: 5 .  citalopram (CELEXA) 40 MG tablet, Take 1 tablet (40 mg total) by mouth daily., Disp: 30 tablet, Rfl: 5 .  clonazePAM  (KLONOPIN) 0.5 MG tablet, Take 1 tablet (0.5 mg total) by mouth at bedtime as needed for anxiety., Disp: 30 tablet, Rfl: 5 .  famotidine (PEPCID) 20 MG tablet, Take 20 mg by mouth at bedtime., Disp: , Rfl:  .  gabapentin (NEURONTIN) 800 MG tablet, Take 400 mg by mouth 3 (three) times daily. Takes half tablet, Disp: , Rfl:  .  hydrochlorothiazide (HYDRODIURIL) 25 MG tablet, TAKE 1 TABLET BY MOUTH EVERY DAY, Disp: 90 tablet, Rfl: 3 .  ipratropium-albuterol (DUONEB) 0.5-2.5 (3) MG/3ML SOLN, 1 neb every 6 hours as needed for help breathing, Disp: 75 mL, Rfl: 12 .  mirtazapine (REMERON) 15 MG tablet, Take 0.5-1 tablets (7.5-15 mg total) by mouth at bedtime as needed., Disp: 30 tablet, Rfl: 5 .  Nebulizers (COMPRESSOR/NEBULIZER) MISC, Use as directed, Disp: 1 each, Rfl: 0 .  ondansetron (ZOFRAN-ODT) 4 MG disintegrating tablet, TAKE 1 TABLET BY MOUTH EVERY 8 HOURS AS NEEDED FOR NAUSEA AND VOMITING, Disp: 30 tablet, Rfl: 2 .  Oxycodone HCl 10 MG TABS, Take 1 tablet (10 mg total) by mouth every 6 (six) hours as needed ((score 7 to 10))., Disp: 120 tablet, Rfl: 0 .  pantoprazole (PROTONIX) 40 MG tablet, TAKE 1 TABLET (40 MG TOTAL) BY MOUTH DAILY. TAKE 30-60 MIN  BEFORE FIRST MEAL OF THE DAY, Disp: 30 tablet, Rfl: 2 .  primidone (MYSOLINE) 250 MG tablet, Take 250 mg by mouth 4 (four) times daily., Disp: , Rfl:  .  PROAIR HFA 108 (90 Base) MCG/ACT inhaler, INHALE 2 PUFFS INTO THE LUNGS EVERY 4 (FOUR) HOURS AS NEEDED FOR WHEEZING OR SHORTNESS OF BREATH., Disp: 8.5 Inhaler, Rfl: 1 .  riTUXimab (RITUXAN) 100 MG/10ML injection, Inject into the vein., Disp: , Rfl:  .  sucralfate (CARAFATE) 1 GM/10ML suspension, Take 10 mLs (1 g total) by mouth every 8 (eight) hours as needed., Disp: 420 mL, Rfl: 3 .  tizanidine (ZANAFLEX) 2 MG capsule, Take 2 mg by mouth daily. , Disp: , Rfl:  .  umeclidinium-vilanterol (ANORO ELLIPTA) 62.5-25 MCG/INH AEPB, Inhale 1 puff into the lungs daily. (Patient not taking: Reported on  11/08/2018), Disp: 60 each, Rfl: 11 .  valACYclovir (VALTREX) 1000 MG tablet, TAKE 1 TABLET 2 TIMES DAILY. THEN STAY ON ONE TABLET DAILY AS PREVENTION (Patient taking differently: daily. ), Disp: 40 tablet, Rfl: 5  Assessment/ Plan: 63 y.o. female   1. Cough Chronic issue for patient but worsened cough over the last 2 days.  I reviewed her CT scan from December 2019 which showed bronchitis but no other significant pulmonary findings.  I have advised her to go ahead and proceed with COVID-19 testing given acute on chronic nature of cough and given reports of fatigue and headache.  I did offer referral to [facility] for testing but patient "does not want to go out there" and therefore she will seek testing at the [facility] on Thursday during their available 10 to 2 slots.  We discussed reasons for emergent evaluation the emergency department.  She voiced good understanding.  She is to follow-up with pulmonology for ongoing work-up. - benzonatate (TESSALON) 200 MG capsule; Take 1 capsule (200 mg total) by mouth 2 (two) times daily as needed for cough.  Dispense: 20 capsule; Refill: 0    Start time: 1:35pm End time: 1:50pm  Total time spent on patient care (including telephone call/ virtual visit): 19 minutes  [place], [place] [phone]

## 2018-12-19 ENCOUNTER — Emergency Department (HOSPITAL_COMMUNITY): Payer: Medicare Other

## 2018-12-19 ENCOUNTER — Other Ambulatory Visit: Payer: Self-pay

## 2018-12-19 ENCOUNTER — Observation Stay (HOSPITAL_COMMUNITY)
Admission: EM | Admit: 2018-12-19 | Discharge: 2018-12-20 | Disposition: A | Discharge status: Home / Self Care | Payer: Medicare Other | Attending: Family Medicine | Admitting: Family Medicine

## 2018-12-19 ENCOUNTER — Encounter (HOSPITAL_COMMUNITY): Payer: Self-pay | Admitting: Emergency Medicine

## 2018-12-19 DIAGNOSIS — F314 Bipolar disorder, current episode depressed, severe, without psychotic features: Principal | ICD-10-CM | POA: Insufficient documentation

## 2018-12-19 DIAGNOSIS — R05 Cough: Secondary | ICD-10-CM

## 2018-12-19 DIAGNOSIS — I639 Cerebral infarction, unspecified: Secondary | ICD-10-CM

## 2018-12-19 DIAGNOSIS — G35 Multiple sclerosis: Secondary | ICD-10-CM | POA: Diagnosis present

## 2018-12-19 DIAGNOSIS — F319 Bipolar disorder, unspecified: Secondary | ICD-10-CM | POA: Diagnosis present

## 2018-12-19 DIAGNOSIS — Z87891 Personal history of nicotine dependence: Secondary | ICD-10-CM | POA: Diagnosis not present

## 2018-12-19 DIAGNOSIS — R059 Cough, unspecified: Secondary | ICD-10-CM

## 2018-12-19 DIAGNOSIS — B009 Herpesviral infection, unspecified: Secondary | ICD-10-CM

## 2018-12-19 DIAGNOSIS — E871 Hypo-osmolality and hyponatremia: Secondary | ICD-10-CM

## 2018-12-19 DIAGNOSIS — E876 Hypokalemia: Secondary | ICD-10-CM | POA: Diagnosis not present

## 2018-12-19 DIAGNOSIS — Z20828 Contact with and (suspected) exposure to other viral communicable diseases: Secondary | ICD-10-CM | POA: Diagnosis not present

## 2018-12-19 DIAGNOSIS — R0602 Shortness of breath: Secondary | ICD-10-CM | POA: Diagnosis present

## 2018-12-19 DIAGNOSIS — J449 Chronic obstructive pulmonary disease, unspecified: Secondary | ICD-10-CM

## 2018-12-19 DIAGNOSIS — I10 Essential (primary) hypertension: Secondary | ICD-10-CM | POA: Insufficient documentation

## 2018-12-19 DIAGNOSIS — E785 Hyperlipidemia, unspecified: Secondary | ICD-10-CM | POA: Insufficient documentation

## 2018-12-19 DIAGNOSIS — R3989 Other symptoms and signs involving the genitourinary system: Secondary | ICD-10-CM

## 2018-12-19 DIAGNOSIS — Z79899 Other long term (current) drug therapy: Secondary | ICD-10-CM | POA: Diagnosis not present

## 2018-12-19 LAB — CBC WITH DIFFERENTIAL/PLATELET
Abs Immature Granulocytes: 0 10*3/uL (ref 0.00–0.07)
Basophils Absolute: 0 10*3/uL (ref 0.0–0.1)
Basophils Relative: 1 %
Eosinophils Absolute: 0.4 10*3/uL (ref 0.0–0.5)
Eosinophils Relative: 9 %
HCT: 37.7 % (ref 36.0–46.0)
Hemoglobin: 13 g/dL (ref 12.0–15.0)
Immature Granulocytes: 0 %
Lymphocytes Relative: 27 %
Lymphs Abs: 1.1 10*3/uL (ref 0.7–4.0)
MCH: 32.6 pg (ref 26.0–34.0)
MCHC: 34.5 g/dL (ref 30.0–36.0)
MCV: 94.5 fL (ref 80.0–100.0)
Monocytes Absolute: 0.5 10*3/uL (ref 0.1–1.0)
Monocytes Relative: 13 %
Neutro Abs: 2.1 10*3/uL (ref 1.7–7.7)
Neutrophils Relative %: 50 %
Platelets: 268 10*3/uL (ref 150–400)
RBC: 3.99 MIL/uL (ref 3.87–5.11)
RDW: 12.6 % (ref 11.5–15.5)
WBC: 4.2 10*3/uL (ref 4.0–10.5)
nRBC: 0 % (ref 0.0–0.2)

## 2018-12-19 LAB — COMPREHENSIVE METABOLIC PANEL
ALT: 27 U/L (ref 0–44)
AST: 29 U/L (ref 15–41)
Albumin: 4.3 g/dL (ref 3.5–5.0)
Alkaline Phosphatase: 138 U/L — ABNORMAL HIGH (ref 38–126)
Anion gap: 14 (ref 5–15)
BUN: 10 mg/dL (ref 8–23)
CO2: 26 mmol/L (ref 22–32)
Calcium: 8.9 mg/dL (ref 8.9–10.3)
Chloride: 84 mmol/L — ABNORMAL LOW (ref 98–111)
Creatinine, Ser: 0.72 mg/dL (ref 0.44–1.00)
GFR calc Af Amer: >60 mL/min (ref >60)
GFR calc non Af Amer: >60 mL/min (ref >60)
Glucose, Bld: 88 mg/dL (ref 70–99)
Potassium: 3.3 mmol/L — ABNORMAL LOW (ref 3.5–5.1)
Sodium: 124 mmol/L — ABNORMAL LOW (ref 135–145)
Total Bilirubin: 0.5 mg/dL (ref 0.3–1.2)
Total Protein: 7.1 g/dL (ref 6.5–8.1)

## 2018-12-19 LAB — URINALYSIS, ROUTINE W REFLEX MICROSCOPIC
Bilirubin Urine: NEGATIVE
Glucose, UA: NEGATIVE mg/dL
Hgb urine dipstick: NEGATIVE
Ketones, ur: NEGATIVE mg/dL
Leukocytes,Ua: NEGATIVE
Nitrite: NEGATIVE
Protein, ur: NEGATIVE mg/dL
Specific Gravity, Urine: 1.005 (ref 1.005–1.030)
pH: 7 (ref 5.0–8.0)

## 2018-12-19 LAB — TSH: TSH: 0.861 u[IU]/mL (ref 0.350–4.500)

## 2018-12-19 LAB — MAGNESIUM: Magnesium: 2.1 mg/dL (ref 1.7–2.4)

## 2018-12-19 LAB — INFLUENZA PANEL BY PCR (TYPE A & B)
Influenza A By PCR: NEGATIVE
Influenza B By PCR: NEGATIVE

## 2018-12-19 LAB — LACTIC ACID, PLASMA
Lactic Acid, Venous: 1.3 mmol/L (ref 0.5–1.9)
Lactic Acid, Venous: 0.8 mmol/L (ref 0.5–1.9)

## 2018-12-19 LAB — SARS CORONAVIRUS 2 BY RT PCR (HOSPITAL ORDER, PERFORMED IN ~~LOC~~ HOSPITAL LAB): SARS Coronavirus 2: NEGATIVE

## 2018-12-19 LAB — TROPONIN I (HIGH SENSITIVITY)
Troponin I (High Sensitivity): 4 ng/L (ref <18)
Troponin I (High Sensitivity): 5 ng/L (ref <18)

## 2018-12-19 LAB — BRAIN NATRIURETIC PEPTIDE: B Natriuretic Peptide: 36 pg/mL (ref 0.0–100.0)

## 2018-12-19 LAB — D-DIMER, QUANTITATIVE: D-Dimer, Quant: <0.27 ug/mL-FEU (ref 0.00–0.50)

## 2018-12-19 LAB — OSMOLALITY: Osmolality: 258 mOsm/kg — ABNORMAL LOW (ref 275–295)

## 2018-12-19 MED ORDER — GABAPENTIN 400 MG PO CAPS
ORAL_CAPSULE | ORAL | Status: AC
  Administered 2018-12-19: 20:00:00 400 mg via ORAL
  Filled 2018-12-19: qty 1

## 2018-12-19 MED ORDER — BUPROPION HCL ER (SR) 100 MG PO TB12
200.0000 mg | ORAL_TABLET | Freq: Two times a day (BID) | ORAL | Status: DC
  Administered 2018-12-19 – 2018-12-20 (×2): 200 mg via ORAL
  Filled 2018-12-19 (×2): qty 2

## 2018-12-19 MED ORDER — HYDRALAZINE HCL 20 MG/ML IJ SOLN: 10.0000 mg | INTRAMUSCULAR | Status: DC | PRN

## 2018-12-19 MED ORDER — PRIMIDONE 50 MG PO TABS
250.0000 mg | ORAL_TABLET | Freq: Four times a day (QID) | ORAL | Status: DC
  Administered 2018-12-19 – 2018-12-20 (×3): 250 mg via ORAL
  Filled 2018-12-19: qty 5
  Filled 2018-12-19 (×3): qty 1
  Filled 2018-12-19 (×2): qty 5
  Filled 2018-12-19 (×4): qty 1

## 2018-12-19 MED ORDER — GABAPENTIN 400 MG PO CAPS
400.0000 mg | ORAL_CAPSULE | Freq: Three times a day (TID) | ORAL | Status: DC
  Administered 2018-12-19: 20:00:00 400 mg via ORAL

## 2018-12-19 MED ORDER — SUCRALFATE 1 GM/10ML PO SUSP
1.0000 g | Freq: Three times a day (TID) | ORAL | Status: DC | PRN
  Administered 2018-12-19 – 2018-12-20 (×2): 1 g via ORAL
  Filled 2018-12-19 (×2): qty 10

## 2018-12-19 MED ORDER — VALACYCLOVIR HCL 500 MG PO TABS
1000.0000 mg | ORAL_TABLET | Freq: Every day | ORAL | Status: DC
  Administered 2018-12-20: 1000 mg via ORAL
  Filled 2018-12-19 (×2): qty 2

## 2018-12-19 MED ORDER — IPRATROPIUM-ALBUTEROL 0.5-2.5 (3) MG/3ML IN SOLN: 3.0000 mL | RESPIRATORY_TRACT | Status: DC | PRN

## 2018-12-19 MED ORDER — PANTOPRAZOLE SODIUM 40 MG PO TBEC
40.0000 mg | DELAYED_RELEASE_TABLET | Freq: Every day | ORAL | Status: DC
  Administered 2018-12-20: 08:00:00 40 mg via ORAL
  Filled 2018-12-19: qty 1

## 2018-12-19 MED ORDER — ACETAMINOPHEN 650 MG RE SUPP: 650.0000 mg | Freq: Four times a day (QID) | RECTAL | Status: DC | PRN

## 2018-12-19 MED ORDER — TIZANIDINE HCL 2 MG PO TABS
2.0000 mg | ORAL_TABLET | Freq: Every day | ORAL | Status: DC
  Administered 2018-12-19 – 2018-12-20 (×2): 2 mg via ORAL
  Filled 2018-12-19 (×2): qty 1

## 2018-12-19 MED ORDER — CITALOPRAM HYDROBROMIDE 20 MG PO TABS
40.0000 mg | ORAL_TABLET | Freq: Every day | ORAL | Status: DC
  Administered 2018-12-19 – 2018-12-20 (×2): 40 mg via ORAL
  Filled 2018-12-19 (×2): qty 2

## 2018-12-19 MED ORDER — GUAIFENESIN-DM 100-10 MG/5ML PO SYRP
10.0000 mL | ORAL_SOLUTION | Freq: Three times a day (TID) | ORAL | Status: DC
  Administered 2018-12-19 – 2018-12-20 (×3): 10 mL via ORAL
  Filled 2018-12-19 (×3): qty 10

## 2018-12-19 MED ORDER — ACETAMINOPHEN 325 MG PO TABS
650.0000 mg | ORAL_TABLET | Freq: Four times a day (QID) | ORAL | Status: DC | PRN
  Administered 2018-12-20: 650 mg via ORAL
  Filled 2018-12-19: qty 2

## 2018-12-19 MED ORDER — IPRATROPIUM-ALBUTEROL 0.5-2.5 (3) MG/3ML IN SOLN
3.0000 mL | Freq: Four times a day (QID) | RESPIRATORY_TRACT | Status: AC
  Administered 2018-12-19 – 2018-12-20 (×3): 3 mL via RESPIRATORY_TRACT
  Filled 2018-12-19 (×3): qty 3

## 2018-12-19 MED ORDER — OXYCODONE HCL 5 MG PO TABS
10.0000 mg | ORAL_TABLET | Freq: Four times a day (QID) | ORAL | Status: DC | PRN
  Administered 2018-12-19 – 2018-12-20 (×3): 10 mg via ORAL
  Filled 2018-12-19 (×4): qty 2

## 2018-12-19 MED ORDER — ACETAMINOPHEN 325 MG PO TABS
650.0000 mg | ORAL_TABLET | Freq: Once | ORAL | Status: AC
  Administered 2018-12-19: 14:00:00 650 mg via ORAL
  Filled 2018-12-19: qty 2

## 2018-12-19 MED ORDER — ATORVASTATIN CALCIUM 20 MG PO TABS
20.0000 mg | ORAL_TABLET | Freq: Every day | ORAL | Status: DC
  Administered 2018-12-19: 20 mg via ORAL
  Filled 2018-12-19: qty 1

## 2018-12-19 MED ORDER — ASPIRIN EC 81 MG PO TBEC
81.0000 mg | DELAYED_RELEASE_TABLET | Freq: Every day | ORAL | Status: DC
  Administered 2018-12-19: 81 mg via ORAL
  Filled 2018-12-19 (×2): qty 1

## 2018-12-19 MED ORDER — AMLODIPINE BESYLATE 5 MG PO TABS
5.0000 mg | ORAL_TABLET | Freq: Every day | ORAL | Status: DC
  Filled 2018-12-19: qty 1

## 2018-12-19 MED ORDER — SODIUM CHLORIDE 0.9 % IV BOLUS
1000.0000 mL | Freq: Once | INTRAVENOUS | Status: AC
  Administered 2018-12-19: 14:00:00 1000 mL via INTRAVENOUS

## 2018-12-19 MED ORDER — ONDANSETRON HCL 4 MG PO TABS
4.0000 mg | ORAL_TABLET | Freq: Four times a day (QID) | ORAL | Status: DC | PRN
  Administered 2018-12-19 – 2018-12-20 (×2): 4 mg via ORAL
  Filled 2018-12-19 (×2): qty 1

## 2018-12-19 MED ORDER — ENOXAPARIN SODIUM 40 MG/0.4ML ~~LOC~~ SOLN
40.0000 mg | SUBCUTANEOUS | Status: DC
  Administered 2018-12-19: 21:00:00 40 mg via SUBCUTANEOUS
  Filled 2018-12-19: qty 0.4

## 2018-12-19 MED ORDER — CLONAZEPAM 0.5 MG PO TABS
0.5000 mg | ORAL_TABLET | Freq: Every evening | ORAL | Status: DC | PRN
  Administered 2018-12-19: 21:00:00 0.5 mg via ORAL
  Filled 2018-12-19: qty 1

## 2018-12-19 MED ORDER — GADOBUTROL 1 MMOL/ML IV SOLN
6.0000 mL | Freq: Once | INTRAVENOUS | Status: AC | PRN
  Administered 2018-12-19: 6 mL via INTRAVENOUS

## 2018-12-19 MED ORDER — MIRTAZAPINE 15 MG PO TABS
7.5000 mg | ORAL_TABLET | Freq: Every evening | ORAL | Status: DC | PRN
  Administered 2018-12-19: 21:00:00 15 mg via ORAL
  Filled 2018-12-19: qty 1

## 2018-12-19 MED ORDER — GABAPENTIN 400 MG PO CAPS
400.0000 mg | ORAL_CAPSULE | Freq: Three times a day (TID) | ORAL | Status: DC
  Administered 2018-12-20 (×2): 400 mg via ORAL
  Filled 2018-12-19 (×2): qty 1

## 2018-12-19 MED ORDER — POLYETHYLENE GLYCOL 3350 17 G PO PACK: 17.0000 g | PACK | Freq: Every day | ORAL | Status: DC | PRN

## 2018-12-19 MED ORDER — POTASSIUM CHLORIDE IN NACL 40-0.9 MEQ/L-% IV SOLN
INTRAVENOUS | Status: DC
  Administered 2018-12-19 – 2018-12-20 (×2): 100 mL/h via INTRAVENOUS
  Filled 2018-12-19 (×3): qty 1000

## 2018-12-19 MED ORDER — ONDANSETRON HCL 4 MG/2ML IJ SOLN: 4.0000 mg | Freq: Four times a day (QID) | INTRAMUSCULAR | Status: DC | PRN

## 2018-12-19 NOTE — ED Notes (Signed)
EDP continues at bedside. Pt agreeing to be admitted at this time.

## 2018-12-19 NOTE — ED Provider Notes (Signed)
After patient was admitted, but physically still in the ED, she told Dr. Denton Brick, hospitalist, she wanted to leave. Dr. Denton Brick discussed with the patient that she would be leaving AMA.  Dr. Denton Brick asked me to speak with the patient. Upon speaking with the patient, she stated she was upset because she had not been given any ginger ale nor was she given any of her medications.  She complained of resurgence of her headache as well as chronic back pain.  Earlier in her course of care, patient was treated with Tylenol for pain and she told me her pain had improved to a manageable level. She had not expressed to me that her headache had recurred or that she was experiencing back pain. I spoke with the patient at length as well as her husband on the phone and acknowledged the patient's concerns. I addressed the patient's concerns for pain and patient again agreed to stay for admission.   Angel Maddox 12/19/18 2012    Angel Morrison, MD 12/20/18 [phone]

## 2018-12-19 NOTE — ED Notes (Signed)
EDP at bedside discussing medical condition and signing out AMA. Pt persistent on signing out AMA.

## 2018-12-19 NOTE — H&P (Addendum)
History and Physical    Angel Maddox INO:[id number] DOB: 12-30-1955 DOA: 12/19/2018  PCP: Terald Sleeper, PA-C   Patient coming from: Home  I have personally briefly reviewed patient's old medical records in [place]  Chief Complaint: Multiple complaints  HPI: Angel Maddox is a 63 y.o. female with medical history significant for COPD, multiple sclerosis, bipolar disorder, hypertension, who presented to the ED with multiple complaints ongoing over the past 2 to 3 days.  Complaints included chest discomfort, upper extremity weakness and tingling, difficulty swallowing, decreased urination, cough, difficulty breathing, sore throat, nasal congestion, generalized weakness.  Patient traveled to [facility] and came back 3 days ago.  Reportedly symptoms started shortly after coming back.  On my evaluation and with further detailed questioning, most of her symptoms are unchanged except for her cough, she reports she has chronic cough but over the past 2 days her cough has worsened and is dry. She reports associated wheezing a few days ago-has resolved,, she has chronic difficulty breathing that has gradually progressed over the past year but basically unchanged over the past several days.  She is not on home O2.  She reports chest tightness mostly from difficulty breathing.  She has no cardiac history.  No Fever or chills.  She has chronic mild sore throat and chronic difficulty swallowing requiring her to chew her food more to avoid choking.  She has chronic right upper extremity weakness with tingling, but over the past few weeks her left upper extremity is having similar symptoms of weakness and tingling.  She has chronic lower extremity weakness, she is working with a physical therapist and her strength has improved, she ambulates with a walker.  No change in vision or speech, no facial asymmetry. She reports poor p.o. intake over the past 1 day, no vomiting, occasional ~2 loose stools/  daily-also chronic.  ED Course: Stable vitals.  WBC 4.2.  Normal lactic acid 1.3.  BNP, magnesium, d-dimer-within normal limits.  Unremarkable portable chest x-ray.  EKG sinus rhythm without ST or T wave changes.  Sodium low 124, potassium 3.3.  Chronically elevated ALP 138. EDP talked to Dr. Merlene Laughter with concern for MS flare, MRI brain with and without contrast recommended, ordered pending results.  Patient given 1 L bolus normal saline in the ED hospitalist called to admit for hyponatremia and hypokalemia.  Review of Systems: As per HPI all other systems reviewed and negative.  Past Medical History:  Diagnosis Date  . Anxiety   . Arthritis    osteo  . Bipolar 1 disorder ([place])   . Cervicalgia   . Chronic kidney disease    kidney stone  . COPD (chronic obstructive pulmonary disease) ([place])    new diagnosis- now sees pulmonologist  . Depression   . Elevated liver enzymes    She says "normal now"  . GERD (gastroesophageal reflux disease)   . H/O hiatal hernia   . Headache   . Hip fracture ([place]) 2017   left  . History of kidney stones    4-5 yrs ago  . Hypertension   . Multiple sclerosis ([place])     Had for 15 years  . Multiple sclerosis ([place])    dx 1999  . Tremors of nervous system     Past Surgical History:  Procedure Laterality Date  . ANTERIOR CERVICAL DECOMP/DISCECTOMY FUSION N/A 07/28/2017   Procedure: CERVICAL FOUR-FIVE, CERVICAL FIVE-SIX ANTERIOR CERVICAL DECOMPRESSION/DISCECTOMY FUSION;  Surgeon: Eustace Moore, MD;  Location: [facility]  OR;  Service: Neurosurgery;  Laterality: N/A;  . COLONOSCOPY  2008   Dr.Kaplan  . EYE SURGERY     [facility] surgical center  . HIP PINNING,CANNULATED Left 11/09/2015   Procedure: CANNULATED HIP PINNING;  Surgeon: Rod Can, MD;  Location: [facility] ORS;  Service: Orthopedics;  Laterality: Left;  . LIPOMA EXCISION    . TUBAL LIGATION       reports that she quit smoking about 27 years ago. Her smoking use included cigarettes. She has a 3.75  pack-year smoking history. She has never used smokeless tobacco. She reports that she does not drink alcohol or use drugs.  Allergies  Allergen Reactions  . Hydrocodone Itching    Family History  Problem Relation Age of Onset  . Heart attack Mother   . Cancer Maternal Aunt   . Colon cancer Neg Hx   . Stomach cancer Neg Hx     Prior to Admission medications   Medication Sig Start Date End Date Taking? Authorizing Provider  benzonatate (TESSALON) 200 MG capsule Take 1 capsule (200 mg total) by mouth 2 (two) times daily as needed for cough. 12/18/18  Yes Gottschalk, Ashly M, DO  buPROPion (WELLBUTRIN SR) 200 MG 12 hr tablet Take 1 tablet (200 mg total) by mouth 2 (two) times daily. 11/05/18  Yes Hurst, Dorothea Glassman, PA-C  citalopram (CELEXA) 40 MG tablet Take 1 tablet (40 mg total) by mouth daily. 10/10/18  Yes Hurst, Dorothea Glassman, PA-C  clonazePAM (KLONOPIN) 0.5 MG tablet Take 1 tablet (0.5 mg total) by mouth at bedtime as needed for anxiety. 11/05/18  Yes Donnal Moat T, PA-C  famotidine (PEPCID) 20 MG tablet Take 20 mg by mouth at bedtime.   Yes [provider]  gabapentin (NEURONTIN) 800 MG tablet Take 400 mg by mouth 3 (three) times daily. Takes half tablet   Yes [provider]  hydrochlorothiazide (HYDRODIURIL) 25 MG tablet TAKE 1 TABLET BY MOUTH EVERY DAY 11/26/18  Yes Terald Sleeper, PA-C  ipratropium-albuterol (DUONEB) 0.5-2.5 (3) MG/3ML SOLN 1 neb every 6 hours as needed for help breathing 10/18/18  Yes Young, Tarri Fuller D, MD  mirtazapine (REMERON) 15 MG tablet Take 0.5-1 tablets (7.5-15 mg total) by mouth at bedtime as needed. 11/05/18  Yes Donnal Moat T, PA-C  Nebulizers (COMPRESSOR/NEBULIZER) MISC Use as directed 10/18/18  Yes Young, Tarri Fuller D, MD  ondansetron (ZOFRAN-ODT) 4 MG disintegrating tablet TAKE 1 TABLET BY MOUTH EVERY 8 HOURS AS NEEDED FOR NAUSEA AND VOMITING 12/05/18  Yes Armbruster, Carlota Raspberry, MD  Oxycodone HCl 10 MG TABS Take 1 tablet (10 mg total) by mouth every 6  (six) hours as needed ((score 7 to 10)). 07/10/18  Yes Terald Sleeper, PA-C  pantoprazole (PROTONIX) 40 MG tablet TAKE 1 TABLET (40 MG TOTAL) BY MOUTH DAILY. TAKE 30-60 MIN BEFORE FIRST MEAL OF THE DAY 09/11/18  Yes Tanda Rockers, MD  primidone (MYSOLINE) 250 MG tablet Take 250 mg by mouth 4 (four) times daily.   Yes [provider]  PROAIR HFA 108 (90 Base) MCG/ACT inhaler INHALE 2 PUFFS INTO THE LUNGS EVERY 4 (FOUR) HOURS AS NEEDED FOR WHEEZING OR SHORTNESS OF BREATH. 11/26/18  Yes Terald Sleeper, PA-C  riTUXimab (RITUXAN) 100 MG/10ML injection Inject into the vein.   Yes [provider]  sucralfate (CARAFATE) 1 GM/10ML suspension Take 10 mLs (1 g total) by mouth every 8 (eight) hours as needed. 10/25/18  Yes Armbruster, Carlota Raspberry, MD  tizanidine (ZANAFLEX) 2 MG capsule Take 2  mg by mouth daily.    Yes [provider]  umeclidinium-vilanterol (ANORO ELLIPTA) 62.5-25 MCG/INH AEPB Inhale 1 puff into the lungs daily. 09/27/18  Yes Tanda Rockers, MD  valACYclovir (VALTREX) 1000 MG tablet TAKE 1 TABLET 2 TIMES DAILY. THEN STAY ON ONE TABLET DAILY AS PREVENTION Patient taking differently: daily.  02/27/18  Yes Terald Sleeper, PA-C    Physical Exam: Vitals:   12/19/18 1022 12/19/18 1551 12/19/18 1600 12/19/18 1630  BP:  (!) 159/101 (!) 169/109 (!) 167/99  Pulse:  66 (!) 59 65  Resp:  (!) 24 13 (!) 25  Temp:      TempSrc:      SpO2:      Weight: 60.3 kg     Height: 5\' 7"  (1.702 m)       Constitutional: NAD, calm, comfortable Vitals:   12/19/18 1022 12/19/18 1551 12/19/18 1600 12/19/18 1630  BP:  (!) 159/101 (!) 169/109 (!) 167/99  Pulse:  66 (!) 59 65  Resp:  (!) 24 13 (!) 25  Temp:      TempSrc:      SpO2:      Weight: 60.3 kg     Height: 5\' 7"  (1.702 m)      Eyes: PERRL, lids and conjunctivae normal ENMT: Mucous membranes are moist. Posterior pharynx clear of any exudate or lesions. Neck: normal, supple, no masses, no thyromegaly Respiratory: clear to  auscultation bilaterally, no wheezing, no crackles. Normal respiratory effort. No accessory muscle use.  Cardiovascular: Mild Bradycardia, regular rate and rhythm, no murmurs / rubs / gallops. No extremity edema. 2+ pedal pulses.   Abdomen: no tenderness, no masses palpated. No hepatosplenomegaly. Bowel sounds positive.  Musculoskeletal: no clubbing / cyanosis. No joint deformity upper and lower extremities. Good ROM, no contractures. Normal muscle tone.  Skin: no rashes, lesions, ulcers. No induration Neurologic: CN 2-12 grossly intact. Sensation intact,  Strength right upper extremity 4/5, left UE 5/5, Right LE 4/5, Left LE 3+/5 Psychiatric: Normal judgment and insight. Alert and oriented x 3. Normal mood.   Labs on Admission: I have personally reviewed following labs and imaging studies  CBC: Recent Labs  Lab 12/19/18 1357  WBC 4.2  NEUTROABS 2.1  HGB 13.0  HCT 37.7  MCV 94.5  PLT 338   Basic Metabolic Panel: Recent Labs  Lab 12/19/18 1357 12/19/18 1438  NA 124*  --   K 3.3*  --   CL 84*  --   CO2 26  --   GLUCOSE 88  --   BUN 10  --   CREATININE 0.72  --   CALCIUM 8.9  --   MG  --  2.1   Liver Function Tests: Recent Labs  Lab 12/19/18 1357  AST 29  ALT 27  ALKPHOS 138*  BILITOT 0.5  PROT 7.1  ALBUMIN 4.3   BNP (last 3 results) Recent Labs    07/03/18 1021 09/17/18 1446  PROBNP 97.0 75.0  Urine analysis:    Component Value Date/Time   COLORURINE STRAW (A) 12/19/2018 [place] 12/19/2018 1246   LABSPEC 1.005 12/19/2018 [place] 7.0 12/19/2018 [place] 12/19/2018 [place] 12/19/2018 1246   [place] 12/19/2018 [place] 12/19/2018 1246   PROTEINUR NEGATIVE 12/19/2018 1246   NITRITE NEGATIVE 12/19/2018 1246   LEUKOCYTESUR NEGATIVE 12/19/2018 1246    Radiological Exams on Admission: Dg Chest Port 1 View  Result Date:  12/19/2018 CLINICAL DATA:  Worsening cough. EXAM:  PORTABLE CHEST 1 VIEW COMPARISON:  09/17/2018. FINDINGS: Mediastinum and hilar structures normal. Lungs are clear. No pleural effusion pneumothorax. Heart size normal. Cervical spine fusion. IMPRESSION: No acute abnormality. Electronically Signed   By: Marcello Moores  Register   On: 12/19/2018 13:33    EKG: Independently reviewed.  Sinus rhythm, rate 60, QTc 475.  Old nonspecific abnormalities V2 V3.  Assessment/Plan Active Problems:   Hyponatremia    Hyponatremia-sodium 124, Recent baseline ~131-132.  Sodium chronically low patient reports after it is corrected it goes back down.  Likely secondary to HCTZ use, possible prerenal component.  SSRI probably contributing also.  1 Liter bolus given in ED - N/s + 40 KCL 100cc/hr x 1 day - BMP a.m - Urine Sodium -Urine osmolality -Serum osmolality -TSH -Hold home HCTZ -Addendum-patient initially requesting to leave AMA, I talked to patient, explained why admission was requested, MRI findings and plan of care. She decided to stay after EDP talked to patient.  Mild hypokalemia-3.3, likely HCTZ induced.  Magnesium normal 2.1. -Replete, recheck in a.m.  Multiple sclerosis- chronic right upper extremity numbness and tingling, left lower extremity weakness. Chronically on rituximab infusions every 4 months.  Follows with Neurologist Dr. Delphia Grates in [place]. MRI brain-  No evidence for active demyelination. Small remote right thalamic lacunar infarct, chronic in appearance, but new relative to 2019 ( see detailed report). -Continue home primidone  Chest discomfort-resolved.  Atypical.  No history of cardiac disease.  High-sensitivity troponin x2-.  EKG unchanged.  Etiology at this time does not appear to be cardiac.  Likely musculoskeletal from cough. -EKG a.m.  COPD-likely recent mild exacerbation with worsening chronic dry cough, she has chronic unchanged difficulty breathing.  Not on home O2.  No no fever.  WBC 4.2 without leukopenia.  D-dimer  not elevated..  Chest x-ray clear.  SARS-CoV-2 test negative.  Pulmonology - Duonebs scheduled and PRN - Mucolytics scheduled -Influenza and RVP panel  Dysphagia-chronic progressive over the past several months. -Speech therapy evaluation -Liquid diet  Chronic CVA- chronic right upper extremity numbness and tingling, left lower extremity weakness. MRI shows small remote right thalamic lacunar infarct.  Chronic in appearance but is new relative to 2019.  I do not see a prior stroke work-up, she has echo scheduled for July 1st for work-up for her lung disease. - Will start aspirin, statins - Lipid panel, Hgba1c in a.m - PT eval - Carotid dopplers - Echocardiogram  Bipolar disorder, depression and anxiety -Continue home Celexa, PRN clonazepam nightly, Remeron,  Hypertension-systolic 440H to 474Q.  On HCTZ.  -Hold HCTZ  with hyponatremia and hypokalemia. -PRN hydralazine -Considering chronic hyponatremia would benefit from alternative hypertensives, will start Norvasc 5mg .  HSV -Continue home valacyclovir   DVT prophylaxis: Lovenox Code Status: Full Family Communication: Spouse on the phone.  Discussed plan of care, and why patient was being admitted. All questions answered. Disposition Plan: Per rounding team Consults called: None Admission status: Obs, Tele   Bethena Roys MD [facility]  12/19/2018, 9:42 PM

## 2018-12-19 NOTE — ED Notes (Signed)
Dr Denton Brick has reviewed benefits of admission and risks of signing out AMA. Pt still requesting to sign out AMA at this time.

## 2018-12-19 NOTE — ED Triage Notes (Addendum)
Pt with hx of MS, c/o worsening deep cough, sore throat, nasal congestion, and generalized weakness x 2-3 days. Pt just returned from [facility]. Denies fever or worsening SOB. Pt does experience chronic SOB due to MS. Pt also sees pulmonology.

## 2018-12-19 NOTE — ED Notes (Signed)
Pt transported to MRI 

## 2018-12-19 NOTE — ED Notes (Signed)
Dr Denton Brick at bedside talking with pt, pt persistent on leaving. Pt Emokpae discussing care with patient. Patient upset and wanting to go home.

## 2018-12-19 NOTE — ED Notes (Signed)
ED TO INPATIENT HANDOFF REPORT  ED Nurse Name and Phone #: [phone]  S Name/Age/Gender [name] 63 y.o. female Room/Bed: APA12/APA12  Code Status   Code Status: Prior  Home/SNF/Other Home Patient oriented to: self Is this baseline? Yes   Triage Complete: Triage complete  Chief Complaint Cough  Triage Note Pt with hx of MS, c/o worsening deep cough, sore throat, nasal congestion, and generalized weakness x 2-3 days. Pt just returned from [facility]. Denies fever or worsening SOB. Pt does experience chronic SOB due to MS. Pt also sees pulmonology.    Allergies Allergies  Allergen Reactions  . Hydrocodone Itching    Level of Care/Admitting Diagnosis ED Disposition    ED Disposition Condition [facility] Area: [facility] [[id number]]  Level of Care: Telemetry [5]  Covid Evaluation: Screening Protocol (No Symptoms)  Diagnosis: Hyponatremia [562130]  Admitting Physician: Bethena Roys [[id number]]  Attending Physician: Bethena Roys Nessa.Cuff  PT Class (Do Not Modify): Observation [104]  PT Acc Code (Do Not Modify): Observation [10022]       B Medical/Surgery History Past Medical History:  Diagnosis Date  . Anxiety   . Arthritis    osteo  . Bipolar 1 disorder ([place])   . Cervicalgia   . Chronic kidney disease    kidney stone  . COPD (chronic obstructive pulmonary disease) ([place])    new diagnosis- now sees pulmonologist  . Depression   . Elevated liver enzymes    She says "normal now"  . GERD (gastroesophageal reflux disease)   . H/O hiatal hernia   . Headache   . Hip fracture ([place]) 2017   left  . History of kidney stones    4-5 yrs ago  . Hypertension   . Multiple sclerosis ([place])     Had for 15 years  . Multiple sclerosis ([place])    dx 1999  . Tremors of nervous system    Past Surgical History:  Procedure Laterality Date  . ANTERIOR CERVICAL DECOMP/DISCECTOMY FUSION N/A 07/28/2017   Procedure: CERVICAL FOUR-FIVE,  CERVICAL FIVE-SIX ANTERIOR CERVICAL DECOMPRESSION/DISCECTOMY FUSION;  Surgeon: Eustace Moore, MD;  Location: [facility];  Service: Neurosurgery;  Laterality: N/A;  . COLONOSCOPY  2008   Dr.Kaplan  . EYE SURGERY     [facility]  . HIP PINNING,CANNULATED Left 11/09/2015   Procedure: CANNULATED HIP PINNING;  Surgeon: Rod Can, MD;  Location: [facility];  Service: Orthopedics;  Laterality: Left;  . LIPOMA EXCISION    . TUBAL LIGATION       A IV Location/Drains/Wounds Patient Lines/Drains/Airways Status   Active Line/Drains/Airways    Name:   Placement date:   Placement time:   Site:   Days:   Peripheral IV 12/19/18 Left Wrist   12/19/18    1440    Wrist   less than 1   Peripheral IV 12/19/18 Left Wrist   12/19/18    1420    Wrist   less than 1   Incision (Closed) 07/28/17 Neck Right   07/28/17    0918     509          Intake/Output Last 24 hours No intake or output data in the 24 hours ending 12/19/18 1746  Labs/Imaging Results for orders placed or performed during the hospital encounter of 12/19/18 (from the past 48 hour(s))  Urinalysis, Routine w reflex microscopic     Status: Abnormal   Collection Time: 12/19/18 12:46 PM  Result  Value Ref Range   Color, Urine STRAW (A) YELLOW   APPearance CLEAR CLEAR   Specific Gravity, Urine 1.005 1.005 - 1.030   pH 7.0 5.0 - 8.0   Glucose, UA NEGATIVE NEGATIVE mg/dL   Hgb urine dipstick NEGATIVE NEGATIVE   Bilirubin Urine NEGATIVE NEGATIVE   Ketones, ur NEGATIVE NEGATIVE mg/dL   Protein, ur NEGATIVE NEGATIVE mg/dL   Nitrite NEGATIVE NEGATIVE   Leukocytes,Ua NEGATIVE NEGATIVE    Comment: Performed at [facility], [address], [place], [place] [address]  CBC with Differential     Status: None   Collection Time: 12/19/18  1:57 PM  Result Value Ref Range   WBC 4.2 4.0 - 10.5 K/uL   RBC 3.99 3.87 - 5.11 MIL/uL   Hemoglobin 13.0 12.0 - 15.0 g/dL   HCT 37.7 36.0 - 46.0 %   MCV 94.5 80.0 - 100.0 fL   MCH 32.6 26.0 - 34.0 pg    MCHC 34.5 30.0 - 36.0 g/dL   RDW 12.6 11.5 - 15.5 %   Platelets 268 150 - 400 K/uL   nRBC 0.0 0.0 - 0.2 %   Neutrophils Relative % 50 %   Neutro Abs 2.1 1.7 - 7.7 K/uL   Lymphocytes Relative 27 %   Lymphs Abs 1.1 0.7 - 4.0 K/uL   Monocytes Relative 13 %   Monocytes Absolute 0.5 0.1 - 1.0 K/uL   Eosinophils Relative 9 %   Eosinophils Absolute 0.4 0.0 - 0.5 K/uL   Basophils Relative 1 %   Basophils Absolute 0.0 0.0 - 0.1 K/uL   Immature Granulocytes 0 %   Abs Immature Granulocytes 0.00 0.00 - 0.07 K/uL    Comment: Performed at [facility], [address], [place], [place] [address]  Comprehensive metabolic panel     Status: Abnormal   Collection Time: 12/19/18  1:57 PM  Result Value Ref Range   Sodium 124 (L) 135 - 145 mmol/L   Potassium 3.3 (L) 3.5 - 5.1 mmol/L   Chloride 84 (L) 98 - 111 mmol/L   CO2 26 22 - 32 mmol/L   Glucose, Bld 88 70 - 99 mg/dL   BUN 10 8 - 23 mg/dL   Creatinine, Ser 0.72 0.44 - 1.00 mg/dL   Calcium 8.9 8.9 - 10.3 mg/dL   Total Protein 7.1 6.5 - 8.1 g/dL   Albumin 4.3 3.5 - 5.0 g/dL   AST 29 15 - 41 U/L   ALT 27 0 - 44 U/L   Alkaline Phosphatase 138 (H) 38 - 126 U/L   Total Bilirubin 0.5 0.3 - 1.2 mg/dL   GFR calc non Af Amer >60 >60 mL/min   GFR calc Af Amer >60 >60 mL/min   Anion gap 14 5 - 15    Comment: Performed at [facility], [address], [place], [place] [address]  Troponin I (High Sensitivity)     Status: None   Collection Time: 12/19/18  1:57 PM  Result Value Ref Range   Troponin I (High Sensitivity) 4.00 <18 ng/L    Comment: (NOTE) Elevated high sensitivity troponin I (hsTnI) values and significant  changes across serial measurements may suggest ACS but many other  chronic and acute conditions are known to elevate hsTnI results.  Refer to the "Links" section for chest pain algorithms and additional  guidance. Performed at [facility], [address] [place], [place] [address]   SARS Coronavirus 2 (CEPHEID- Performed in [facility] hospital lab), [facility]  Status: None   Collection Time: 12/19/18  1:57 PM   Specimen: Nasopharyngeal Swab  Result Value Ref Range   SARS Coronavirus 2 NEGATIVE NEGATIVE    Comment: (NOTE) If result is NEGATIVE SARS-CoV-2 target nucleic acids are NOT DETECTED. The SARS-CoV-2 RNA is generally detectable in upper and lower  respiratory specimens during the acute phase of infection. The lowest  concentration of SARS-CoV-2 viral copies this assay can detect is 250  copies / mL. A negative result does not preclude SARS-CoV-2 infection  and should not be used as the sole basis for treatment or other  patient management decisions.  A negative result may occur with  improper specimen collection / handling, submission of specimen other  than nasopharyngeal swab, presence of viral mutation(s) within the  areas targeted by this assay, and inadequate number of viral copies  (<250 copies / mL). A negative result must be combined with clinical  observations, patient history, and epidemiological information. If result is POSITIVE SARS-CoV-2 target nucleic acids are DETECTED. The SARS-CoV-2 RNA is generally detectable in upper and lower  respiratory specimens dur ing the acute phase of infection.  Positive  results are indicative of active infection with SARS-CoV-2.  Clinical  correlation with patient history and other diagnostic information is  necessary to determine patient infection status.  Positive results do  not rule out bacterial infection or co-infection with other viruses. If result is PRESUMPTIVE POSTIVE SARS-CoV-2 nucleic acids MAY BE PRESENT.   A presumptive positive result was obtained on the submitted specimen  and confirmed on repeat testing.  While 2019 novel coronavirus  (SARS-CoV-2) nucleic acids may be present in the submitted sample  additional confirmatory testing may be necessary for epidemiological  and / or clinical management purposes  to differentiate between   SARS-CoV-2 and other Sarbecovirus currently known to infect humans.  If clinically indicated additional testing with an alternate test  methodology [phone]) is advised. The SARS-CoV-2 RNA is generally  detectable in upper and lower respiratory sp ecimens during the acute  phase of infection. The expected result is Negative. Fact Sheet for Patients:  StrictlyIdeas.no Fact Sheet for Healthcare Providers: BankingDealers.co.za This test is not yet approved or cleared by the [place] FDA and has been authorized for detection and/or diagnosis of SARS-CoV-2 by FDA under an Emergency Use Authorization (EUA).  This EUA will remain in effect (meaning this test can be used) for the duration of the COVID-19 declaration under Section 564(b)(1) of the Act, 21 U.S.C. section 360bbb-3(b)(1), unless the authorization is terminated or revoked sooner. Performed at [facility], [address] [place], [place] [address]   Lactic acid, plasma     Status: None   Collection Time: 12/19/18  1:57 PM  Result Value Ref Range   Lactic Acid, Venous 1.3 0.5 - 1.9 mmol/L    Comment: Performed at [facility], [address] [place], [place] [address]  Brain natriuretic peptide     Status: None   Collection Time: 12/19/18  2:38 PM  Result Value Ref Range   B Natriuretic Peptide 36.0 0.0 - 100.0 pg/mL    Comment: Performed at [facility], [address] [place], [place] [address]  Magnesium     Status: None   Collection Time: 12/19/18  2:38 PM  Result Value Ref Range   Magnesium 2.1 1.7 - 2.4 mg/dL    Comment: Performed at [facility], [address], [place], [place] [address]  Troponin I (High Sensitivity)     Status:  None   Collection Time: 12/19/18  3:22 PM  Result Value Ref Range   Troponin I (High Sensitivity) 5.00 <18 ng/L    Comment: (NOTE) Elevated high sensitivity troponin I (hsTnI) values and significant  changes across serial measurements may  suggest ACS but many other  chronic and acute conditions are known to elevate hsTnI results.  Refer to the "Links" section for chest pain algorithms and additional  guidance. Performed at [facility] [address], [place], [place] [address]   Lactic acid, plasma     Status: None   Collection Time: 12/19/18  3:22 PM  Result Value Ref Range   Lactic Acid, Venous 0.8 0.5 - 1.9 mmol/L    Comment: Performed at [facility], [address] [place], [place] [address]  D-dimer, quantitative (not at [facility])     Status: None   Collection Time: 12/19/18  3:27 PM  Result Value Ref Range   D-Dimer, Quant <0.27 0.00 - 0.50 ug/mL-FEU    Comment: (NOTE) At the manufacturer cut-off of 0.50 ug/mL FEU, this assay has been documented to exclude PE with a sensitivity and negative predictive value of 97 to 99%.  At this time, this assay has not been approved by the FDA to exclude DVT/VTE. Results should be correlated with clinical presentation. Performed at [facility] [address], [place], [place] [address]    Dg Chest Port 1 View  Result Date: 12/19/2018 CLINICAL DATA:  Worsening cough. EXAM: PORTABLE CHEST 1 VIEW COMPARISON:  09/17/2018. FINDINGS: Mediastinum and hilar structures normal. Lungs are clear. No pleural effusion pneumothorax. Heart size normal. Cervical spine fusion. IMPRESSION: No acute abnormality. Electronically Signed   By: Marcello Moores  Register   On: 12/19/2018 13:33    Pending Labs Unresulted Labs (From admission, onward)    Start     Ordered   12/19/18 1712  TSH  Add-on,   AD     12/19/18 1711   12/19/18 1711  Osmolality  Add-on,   AD     12/19/18 1711   12/19/18 1711  Osmolality, urine  Add-on,   AD     12/19/18 1711   12/19/18 1711  Sodium, urine, random  Add-on,   AD     12/19/18 1711   12/19/18 1651  Respiratory Panel by PCR  (Respiratory virus panel with precautions)  Once,   R     12/19/18 1650   12/19/18 1650  Influenza panel by PCR (type A & B)  (Influenza PCR Panel)   Once,   STAT     12/19/18 1650   12/19/18 1301  Culture, blood (routine x 2)  BLOOD CULTURE X 2,   STAT     12/19/18 1300   12/19/18 1246  Urine culture  ONCE - STAT,   STAT     12/19/18 1246   Signed and Held  HIV antibody (Routine Testing)  Once,   R     Signed and Held   Signed and Held  Basic metabolic panel  Tomorrow morning,   R     Signed and Held          Vitals/Pain Today's Vitals   12/19/18 1022 12/19/18 1551 12/19/18 1600 12/19/18 1630  BP:  (!) 159/101 (!) 169/109 (!) 167/99  Pulse:  66 (!) 59 65  Resp:  (!) 24 13 (!) 25  Temp:      TempSrc:      SpO2:      Weight: 60.3 kg  Height: 5\' 7"  (1.702 m)     PainSc:        Isolation Precautions Droplet precaution  Medications Medications  0.9 % NaCl with KCl 40 mEq / L  infusion (has no administration in time range)  sodium chloride 0.9 % bolus 1,000 mL (0 mLs Intravenous Stopped 12/19/18 1634)  acetaminophen (TYLENOL) tablet 650 mg (650 mg Oral Given 12/19/18 1400)  gadobutrol (GADAVIST) 1 MMOL/ML injection 6 mL (6 mLs Intravenous Contrast Given 12/19/18 1714)    Mobility walks Low fall risk   Focused Assessments    R Recommendations: See Admitting Provider Note  Report given to:   Additional Notes:

## 2018-12-19 NOTE — ED Notes (Signed)
Dr Denton Brick on phone with pt's husband discussing medical findings and reason for admission per pt request.

## 2018-12-19 NOTE — ED Provider Notes (Signed)
[facility] EMERGENCY DEPARTMENT Provider Note   CSN: [id number] Arrival date & time: 12/19/18  1008    History   Chief Complaint Chief Complaint  Patient presents with   Cough    HPI Angel Maddox is a 63 y.o. female.     HPI   Angel Maddox is a 63 y.o. female, with a history of MS, anxiety, COPD, GERD HTN, presenting to the ED with "feeling sick." Patient was quite vague about this. When I asked for more details, she initially said, "There are too many things that have been going on for too long to be more specific." After awhile, I was able to get the following acute complaints: Cough and shortness of breath: Returned from [facility] three days ago. Cough began shortly after returning. Breathing worsened (beyond her baseline shortness of breath) yesterday and had to increase neb use from twice daily to four times daily.  Yesterday she underwent a telephone visit with her PCP for her cough. She was prescribed tessalon, which has helped with the cough, and advised to undergo outpatient COVID-19 testing, but has not yet done so.  Chest discomfort: Has also been experiencing left chest soreness, behind the left breast, lasting for a few hours at a time, radiating into the left side of the neck, 8/10. Comes on at rest, does not seem to be associated with exertion, deep breathing, or cough.  The last episode was while she was in the waiting room.  She was not experiencing chest discomfort during my interview.  Upper extremity weakness: Tingling and weakness in both arms. Typically has some tingling and weakness in right UE, but a few days ago started in the left UE.  No changes in strength or sensation in the lower extremities.  No facial weakness or numbness, difficulty speaking, facial droop.  Difficulty swallowing: She notes difficulty swallowing beginning about 2 years ago, but worsening in the last couple weeks.  She has been having to chew her food more thoroughly.  She  has not been drooling, choking, or gagging.  Decreased urination: Decreased urination for the last several days.  Denies dysuria, hematuria, lower abdominal pain, flank pain, back pain.  Other symptoms: Also complains of headache, fatigue, and malaise.  Denies fever/chills, hemoptysis, acute abdominal pain, N/V/C/D, lower extremity swelling/pain, acute LE weakness/numbness, sore throat, or any other complaints.    She notes she has had shortness of breath increasing over last two years. Has an echo scheduled for July 1. MS neurologist: Effie Shy, Ursula Alert She is receiving rituxin infusions every 4 months with next dose scheduled for July 28, last infusion March 24.   The patient interview took an extended amount of time. Patient would start talking about things that have been going on for months or years. I had to orient patient back to her acute complaints mulitople times.   Past Medical History:  Diagnosis Date   Anxiety    Arthritis    osteo   Bipolar 1 disorder (HCC)    Cervicalgia    Chronic kidney disease    kidney stone   COPD (chronic obstructive pulmonary disease) (HCC)    new diagnosis- now sees pulmonologist   Depression    Elevated liver enzymes    She says "normal now"   GERD (gastroesophageal reflux disease)    H/O hiatal hernia    Headache    Hip fracture ([place]) 2017   left   History of kidney stones    4-5 yrs ago  Hypertension    Multiple sclerosis ([place])     Had for 15 years   Multiple sclerosis ([place])    dx 1999   Tremors of nervous system     Patient Active Problem List   Diagnosis Date Noted   Hyponatremia 07/04/2018   DOE (dyspnea on exertion) 07/03/2018   COPD  clinical dx / unable to perform pfts 07/03/2018   MDD (major depressive disorder) 06/28/2018   GAD (generalized anxiety disorder) 06/28/2018   Major depressive disorder, recurrent episode, moderate ([place]) 04/17/2018   Head injury 12/08/2017   Cervical  vertebral fusion 07/28/2017   DDD (degenerative disc disease), cervical 07/10/2017   DDD (degenerative disc disease), thoracic 07/10/2017   DDD (degenerative disc disease), lumbar 07/10/2017   Facet arthritis of cervical region 07/10/2017   Abnormal liver function tests 03/07/2017   Hypokalemia 03/07/2017   Gastroesophageal reflux disease with esophagitis 03/07/2017   Esophageal dysphagia 03/07/2017   Chronic idiopathic constipation 02/01/2017   Fatigue 02/01/2017   HSV-1 infection 01/12/2017   Osteoporosis with pathological fracture, with routine healing, subsequent encounter 08/30/2016   MDD (major depressive disorder), recurrent episode, severe ([place]) 03/29/2016   Estrogen deficiency 12/15/2015   MS (multiple sclerosis) ([place]) 12/15/2015   Tremor 12/15/2015   Healthcare maintenance 12/15/2015   Protein-calorie malnutrition, severe 11/09/2015   Bipolar 1 disorder, depressed ([place]) 02/11/2015    Past Surgical History:  Procedure Laterality Date   ANTERIOR CERVICAL DECOMP/DISCECTOMY FUSION N/A 07/28/2017   Procedure: CERVICAL FOUR-FIVE, CERVICAL FIVE-SIX ANTERIOR CERVICAL DECOMPRESSION/DISCECTOMY FUSION;  Surgeon: Eustace Moore, MD;  Location: [place];  Service: Neurosurgery;  Laterality: N/A;   COLONOSCOPY  2008   Dr.Kaplan   EYE SURGERY     [facility]   HIP PINNING,CANNULATED Left 11/09/2015   Procedure: CANNULATED HIP PINNING;  Surgeon: Rod Can, MD;  Location: [facility];  Service: Orthopedics;  Laterality: Left;   LIPOMA EXCISION     TUBAL LIGATION       OB History   No obstetric history on file.      Home Medications    Prior to Admission medications   Medication Sig Start Date End Date Taking? Authorizing Provider  benzonatate (TESSALON) 200 MG capsule Take 1 capsule (200 mg total) by mouth 2 (two) times daily as needed for cough. 12/18/18  Yes Gottschalk, Ashly M, DO  buPROPion (WELLBUTRIN SR) 200 MG 12 hr tablet Take 1 tablet  (200 mg total) by mouth 2 (two) times daily. 11/05/18  Yes Hurst, Dorothea Glassman, PA-C  citalopram (CELEXA) 40 MG tablet Take 1 tablet (40 mg total) by mouth daily. 10/10/18  Yes Hurst, Dorothea Glassman, PA-C  clonazePAM (KLONOPIN) 0.5 MG tablet Take 1 tablet (0.5 mg total) by mouth at bedtime as needed for anxiety. 11/05/18  Yes Donnal Moat T, PA-C  famotidine (PEPCID) 20 MG tablet Take 20 mg by mouth at bedtime.   Yes [provider]  gabapentin (NEURONTIN) 800 MG tablet Take 400 mg by mouth 3 (three) times daily. Takes half tablet   Yes [provider]  hydrochlorothiazide (HYDRODIURIL) 25 MG tablet TAKE 1 TABLET BY MOUTH EVERY DAY 11/26/18  Yes Terald Sleeper, PA-C  ipratropium-albuterol (DUONEB) 0.5-2.5 (3) MG/3ML SOLN 1 neb every 6 hours as needed for help breathing 10/18/18  Yes Young, Tarri Fuller D, MD  mirtazapine (REMERON) 15 MG tablet Take 0.5-1 tablets (7.5-15 mg total) by mouth at bedtime as needed. 11/05/18  Yes Addison Lank, PA-C  Nebulizers (COMPRESSOR/NEBULIZER) MISC Use as directed 10/18/18  Yes Young, Tarri Fuller D, MD  ondansetron (ZOFRAN-ODT) 4 MG disintegrating tablet TAKE 1 TABLET BY MOUTH EVERY 8 HOURS AS NEEDED FOR NAUSEA AND VOMITING 12/05/18  Yes Armbruster, Carlota Raspberry, MD  Oxycodone HCl 10 MG TABS Take 1 tablet (10 mg total) by mouth every 6 (six) hours as needed ((score 7 to 10)). 07/10/18  Yes Terald Sleeper, PA-C  pantoprazole (PROTONIX) 40 MG tablet TAKE 1 TABLET (40 MG TOTAL) BY MOUTH DAILY. TAKE 30-60 MIN BEFORE FIRST MEAL OF THE DAY 09/11/18  Yes Tanda Rockers, MD  primidone (MYSOLINE) 250 MG tablet Take 250 mg by mouth 4 (four) times daily.   Yes [provider]  PROAIR HFA 108 (90 Base) MCG/ACT inhaler INHALE 2 PUFFS INTO THE LUNGS EVERY 4 (FOUR) HOURS AS NEEDED FOR WHEEZING OR SHORTNESS OF BREATH. 11/26/18  Yes Terald Sleeper, PA-C  riTUXimab (RITUXAN) 100 MG/10ML injection Inject into the vein.   Yes [provider]  sucralfate (CARAFATE) 1 GM/10ML  suspension Take 10 mLs (1 g total) by mouth every 8 (eight) hours as needed. 10/25/18  Yes Armbruster, Carlota Raspberry, MD  tizanidine (ZANAFLEX) 2 MG capsule Take 2 mg by mouth daily.    Yes [provider]  umeclidinium-vilanterol (ANORO ELLIPTA) 62.5-25 MCG/INH AEPB Inhale 1 puff into the lungs daily. 09/27/18  Yes Tanda Rockers, MD  valACYclovir (VALTREX) 1000 MG tablet TAKE 1 TABLET 2 TIMES DAILY. THEN STAY ON ONE TABLET DAILY AS PREVENTION Patient taking differently: daily.  02/27/18  Yes Terald Sleeper, PA-C    Family History Family History  Problem Relation Age of Onset   Heart attack Mother    Cancer Maternal Aunt    Colon cancer Neg Hx    Stomach cancer Neg Hx     Social History Social History   Tobacco Use   Smoking status: Former Smoker    Packs/day: 0.25    Years: 15.00    Pack years: 3.75    Types: Cigarettes    Quit date: 01/25/1991    Years since quitting: 27.9   Smokeless tobacco: Never Used  Substance Use Topics   Alcohol use: No   Drug use: No     Allergies   Hydrocodone   Review of Systems Review of Systems  Constitutional: Negative for chills, diaphoresis and fever.  HENT: Positive for trouble swallowing. Negative for drooling, facial swelling, sore throat and voice change.   Respiratory: Positive for cough and shortness of breath.   Cardiovascular: Positive for chest pain. Negative for leg swelling.  Gastrointestinal: Negative for abdominal pain, blood in stool, constipation, diarrhea, nausea and vomiting.  Genitourinary: Positive for decreased urine volume. Negative for dysuria, flank pain, frequency and hematuria.  Musculoskeletal: Negative for back pain and neck pain.  Neurological: Positive for weakness. Negative for dizziness, syncope and light-headedness.  All other systems reviewed and are negative.    Physical Exam Updated Vital Signs BP (!) 160/116 (BP Location: Right Arm) Comment: repeated x 2   Pulse 73    Temp 98.9 F (37.2  C) (Oral)    Resp 16    Ht 5\' 7"  (1.702 m)    Wt 60.3 kg    SpO2 96%    BMI 20.83 kg/m   Physical Exam Vitals signs and nursing note reviewed.  Constitutional:      General: She is not in acute distress.    Appearance: She is well-developed. She is not diaphoretic.  HENT:     Head: Normocephalic and  atraumatic.     Mouth/Throat:     Mouth: Mucous membranes are moist.     Pharynx: Oropharynx is clear.     Comments: Airway is open and clear.  Handles oral secretions without noted difficulty.  Seems to be able to hold regular conversation.  No noted difficulty speaking and handling oral secretions at the same time. Eyes:     Extraocular Movements: Extraocular movements intact.     Conjunctiva/sclera: Conjunctivae normal.     Pupils: Pupils are equal, round, and reactive to light.  Neck:     Musculoskeletal: Normal range of motion and neck supple. No muscular tenderness.     Vascular: No carotid bruit.  Cardiovascular:     Rate and Rhythm: Normal rate and regular rhythm.     Pulses: Normal pulses.          Radial pulses are 2+ on the right side and 2+ on the left side.       Posterior tibial pulses are 2+ on the right side and 2+ on the left side.     Heart sounds: Normal heart sounds.     Comments: Tactile temperature in the extremities appropriate and equal bilaterally. Pulmonary:     Effort: Pulmonary effort is normal. No respiratory distress.     Breath sounds: Normal breath sounds.  Chest:     Chest wall: No tenderness.  Abdominal:     Palpations: Abdomen is soft.     Tenderness: There is no abdominal tenderness. There is no guarding.  Musculoskeletal:     Right lower leg: No edema.     Left lower leg: No edema.  Lymphadenopathy:     Cervical: No cervical adenopathy.  Skin:    General: Skin is warm and dry.  Neurological:     Mental Status: She is alert and oriented to person, place, and time.     Comments: Sensation light touch seems to be grossly intact in the upper  and lower extremities. Grips equal, but feel weak. Strength 2/5 in the bilateral shoulders, appears to be equal bilaterally.  She is unable to hold her arms out in front of her for more than about a second or two.  Motor and sensory in lower extremities seems to be intact and to patient's baseline per patient. Cranial nerves III-XII otherwise grossly intact. No facial droop.  She denies vision deficits.  Psychiatric:        Mood and Affect: Mood and affect normal.        Speech: Speech normal.        Behavior: Behavior normal.      ED Treatments / Results  Labs (all labs ordered are listed, but only abnormal results are displayed) Labs Reviewed  COMPREHENSIVE METABOLIC PANEL - Abnormal; Notable for the following components:      Result Value   Sodium 124 (*)    Potassium 3.3 (*)    Chloride 84 (*)    Alkaline Phosphatase 138 (*)    All other components within normal limits  URINALYSIS, ROUTINE W REFLEX MICROSCOPIC - Abnormal; Notable for the following components:   Color, Urine STRAW (*)    All other components within normal limits  SARS CORONAVIRUS 2 (HOSPITAL ORDER, [facility])  URINE CULTURE  CULTURE, BLOOD (ROUTINE X 2)  CULTURE, BLOOD (ROUTINE X 2)  RESPIRATORY PANEL BY PCR  CBC WITH DIFFERENTIAL/PLATELET  TROPONIN I (HIGH SENSITIVITY)  TROPONIN I (HIGH SENSITIVITY)  LACTIC ACID, PLASMA  LACTIC ACID,  PLASMA  BRAIN NATRIURETIC PEPTIDE  MAGNESIUM  D-DIMER, QUANTITATIVE (NOT AT [facility])  TSH  INFLUENZA PANEL BY PCR (TYPE A & B)  OSMOLALITY  OSMOLALITY, URINE  SODIUM, URINE, RANDOM    EKG EKG Interpretation  Date/Time:  Wednesday December 19 2018 13:39:19 EDT Ventricular Rate:  60 PR Interval:    QRS Duration: 107 QT Interval:  475 QTC Calculation: 475 R Axis:   81 Text Interpretation:  Sinus rhythm Borderline right axis deviation Borderline T abnormalities, anterior leads Baseline wander in lead(s) V1 Confirmed by Nat Christen [phone]) on  12/19/2018 7:04:35 PM   Radiology Mr Brain W And Wo Contrast  Result Date: 12/19/2018 CLINICAL DATA:  Initial evaluation for acute generalized weakness, history of a mass. EXAM: MRI HEAD WITHOUT AND WITH CONTRAST TECHNIQUE: Multiplanar, multiecho pulse sequences of the brain and surrounding structures were obtained without and with intravenous contrast. CONTRAST:  60 cc of Gadavist. COMPARISON:  Prior brain MRI from 03/27/2018. FINDINGS: Brain: Mild age-related cerebral atrophy, stable. Patchy T2/FLAIR hyperintensity within the periventricular, deep, and subcortical white matter both cerebral hemispheres again seen, nonspecific. Patchy signal abnormality present within the pons. Overall, changes are nonspecific, but moderate nature. Appearance is relatively unchanged from previous without evidence for significant progression. No evidence for active demyelination. Small remote lacunar infarct present within the right thalamus, chronic in appearance, but new from previous. No abnormal foci of restricted diffusion to suggest acute or subacute ischemia. Gray-white matter differentiation maintained. No other areas of remote cortical infarction. Prominent dilated perivascular spaces noted within the inferior basal ganglia. No evidence for acute or chronic intracranial hemorrhage. No mass lesion, midline shift or mass effect. No hydrocephalus. No extra-axial fluid collection. Pituitary gland within normal limits. No abnormal enhancement. Vascular: Major intracranial vascular flow voids are maintained. Skull and upper cervical spine: Craniocervical junction within normal limits. Upper cervical spine normal. Bone marrow signal intensity within normal limits. No scalp soft tissue abnormality. Sinuses/Orbits: Globes and orbital soft tissues within normal limits. Paranasal sinuses are largely clear. No significant mastoid effusion. Inner ear structures normal. Other: None. IMPRESSION: 1. Moderately advanced cerebral white  matter changes for age, nonspecific, but relatively stable from previous without evidence for significant progression. No evidence for active demyelination. 2. No other acute intracranial abnormality identified. 3. Small remote right thalamic lacunar infarct, chronic in appearance, but new relative to 2019. Electronically Signed   By: Jeannine Boga M.D.   On: 12/19/2018 18:03   Dg Chest Port 1 View  Result Date: 12/19/2018 CLINICAL DATA:  Worsening cough. EXAM: PORTABLE CHEST 1 VIEW COMPARISON:  09/17/2018. FINDINGS: Mediastinum and hilar structures normal. Lungs are clear. No pleural effusion pneumothorax. Heart size normal. Cervical spine fusion. IMPRESSION: No acute abnormality. Electronically Signed   By: Marcello Moores  Register   On: 12/19/2018 13:33    Procedures Procedures (including critical care time)  Medications Ordered in ED Medications  0.9 % NaCl with KCl 40 mEq / L  infusion (100 mL/hr Intravenous New Bag/Given 12/19/18 1756)  sodium chloride 0.9 % bolus 1,000 mL (0 mLs Intravenous Stopped 12/19/18 1634)  acetaminophen (TYLENOL) tablet 650 mg (650 mg Oral Given 12/19/18 1400)  gadobutrol (GADAVIST) 1 MMOL/ML injection 6 mL (6 mLs Intravenous Contrast Given 12/19/18 1714)     Initial Impression / Assessment and Plan / ED Course  I have reviewed the triage vital signs and the nursing notes.  Pertinent labs & imaging results that were available during my care of the patient were reviewed by me and considered  in my medical decision making (see chart for details).  Clinical Course as of Dec 18 1908  Wed Dec 19, 2018  1330 Spoke with Dr. Merlene Laughter, neurologist, regarding early advice on possible MR imaging due to possible MS flare. Recommends MR brain with and without contrast.   [SJ]  1640 Spoke with Dr. Merlene Laughter again. Patient can be admitted for her medical findings. Review MRI results before initiating any steroid therapy.  He can consult on the patient in the morning.   [SJ]    [id number] Spoke with Dr. Denton Brick, hospitalist. Agrees to admit the patient.   [SJ]    Clinical Course User Index [SJ] Courtnee Myer C, PA-C       Patient presents with a variety of complaints, including weakness of the upper extremities, malaise, fatigue, and headache.  Also complains of chest pain and shortness of breath. Low suspicion for ACS. HEART score is 2, indicating low risk for a cardiac event.  EKG without evidence of acute ischemia or pathologic/symptomatic arrhythmia. Wells criteria score is 0, indicating low risk for PE.  D-dimer negative.  High-sensitivity troponin negative. Dissection was considered, but thought less likely base on: History and description of the pain are not suggestive, patient is not ill-appearing, lack of risk factors, equal bilateral pulses, lack of neurologic deficits, no widened mediastinum on chest x-ray.  Hyponatremia of 124 noted.  She has had some hyponatremia in the past, but today's value is much lower.  This could explain some of the patient's symptoms.  Mild hypokalemia.  Negative for coronavirus.  Chest x-ray without acute abnormality.  MS flare considered, however, patient symptoms may be more likely attributed to her electrolyte abnormalities.  No acute abnormalities on MRI.  Patient admitted for further management.  Findings and plan of care discussed with Idelia Salm, DO. Dr. Reather Converse personally evaluated and examined this patient.  Vitals:   12/19/18 1600 12/19/18 1630 12/19/18 1753 12/19/18 1800  BP: (!) 169/109 (!) 167/99 (!) 159/101 (!) 151/108  Pulse: (!) 59 65  64  Resp: 13 (!) 25 12 20   Temp:      TempSrc:      SpO2:      Weight:      Height:         Final Clinical Impressions(s) / ED Diagnoses   Final diagnoses:  Cough  Hyponatremia  Hypokalemia    ED Discharge Orders    None       Layla Maw 12/19/18 1910    Elnora Morrison, MD 12/20/18 [phone]

## 2018-12-19 NOTE — ED Notes (Signed)
MD at bedside. 

## 2018-12-19 NOTE — ED Notes (Signed)
RN called to room because pt was very upset and wanting to leave. RN spoke with pt and attempted to understand why pt was wanting to leave AMA. Pt just continued to state, "I just want to leave. I just do". Pt informed that she was about to transferred upstairs to an admission bed in just a few minutes per her primary nurse. RN attempted to calm and reassure pt. Dr. Denton Brick paged to notify her of situation.

## 2018-12-20 ENCOUNTER — Observation Stay (HOSPITAL_BASED_OUTPATIENT_CLINIC_OR_DEPARTMENT_OTHER): Payer: Medicare Other

## 2018-12-20 ENCOUNTER — Observation Stay (HOSPITAL_COMMUNITY): Payer: Medicare Other

## 2018-12-20 DIAGNOSIS — E876 Hypokalemia: Secondary | ICD-10-CM | POA: Diagnosis not present

## 2018-12-20 DIAGNOSIS — J449 Chronic obstructive pulmonary disease, unspecified: Secondary | ICD-10-CM | POA: Diagnosis not present

## 2018-12-20 DIAGNOSIS — F314 Bipolar disorder, current episode depressed, severe, without psychotic features: Secondary | ICD-10-CM | POA: Diagnosis not present

## 2018-12-20 DIAGNOSIS — E785 Hyperlipidemia, unspecified: Secondary | ICD-10-CM | POA: Diagnosis not present

## 2018-12-20 DIAGNOSIS — I10 Essential (primary) hypertension: Secondary | ICD-10-CM | POA: Diagnosis not present

## 2018-12-20 DIAGNOSIS — F319 Bipolar disorder, unspecified: Secondary | ICD-10-CM | POA: Diagnosis not present

## 2018-12-20 DIAGNOSIS — G35 Multiple sclerosis: Secondary | ICD-10-CM

## 2018-12-20 LAB — RESPIRATORY PANEL BY PCR

## 2018-12-20 LAB — BASIC METABOLIC PANEL
Anion gap: 10 (ref 5–15)
BUN: 9 mg/dL (ref 8–23)
CO2: 26 mmol/L (ref 22–32)
Calcium: 8.7 mg/dL — ABNORMAL LOW (ref 8.9–10.3)
Chloride: 92 mmol/L — ABNORMAL LOW (ref 98–111)
Creatinine, Ser: 0.74 mg/dL (ref 0.44–1.00)
GFR calc Af Amer: >60 mL/min (ref >60)
GFR calc non Af Amer: >60 mL/min (ref >60)
Glucose, Bld: 116 mg/dL — ABNORMAL HIGH (ref 70–99)
Potassium: 3.8 mmol/L (ref 3.5–5.1)
Sodium: 128 mmol/L — ABNORMAL LOW (ref 135–145)

## 2018-12-20 LAB — LIPID PANEL
Cholesterol: 315 mg/dL — ABNORMAL HIGH (ref 0–200)
HDL: 93 mg/dL (ref >40)
LDL Cholesterol: 207 mg/dL — ABNORMAL HIGH (ref 0–99)
Total CHOL/HDL Ratio: 3.4 RATIO
Triglycerides: 76 mg/dL (ref <150)
VLDL: 15 mg/dL (ref 0–40)

## 2018-12-20 LAB — URINE CULTURE: Culture: NO GROWTH

## 2018-12-20 LAB — ECHOCARDIOGRAM COMPLETE
Height: 67 in
Weight: 2176.38 oz

## 2018-12-20 MED ORDER — ATORVASTATIN CALCIUM 40 MG PO TABS: 40.0000 mg | ORAL_TABLET | Freq: Every day | ORAL | Status: DC

## 2018-12-20 MED ORDER — VALACYCLOVIR HCL 1 G PO TABS: 1000.0000 mg | ORAL_TABLET | Freq: Every day | ORAL | Status: DC

## 2018-12-20 MED ORDER — POTASSIUM CHLORIDE IN NACL 40-0.9 MEQ/L-% IV SOLN
INTRAVENOUS | Status: DC
  Administered 2018-12-20: 08:00:00 50 mL/h via INTRAVENOUS

## 2018-12-20 MED ORDER — AMLODIPINE BESYLATE 5 MG PO TABS: 5.0000 mg | ORAL_TABLET | Freq: Every day | ORAL | 1 refills | Status: DC

## 2018-12-20 MED ORDER — CITALOPRAM HYDROBROMIDE 20 MG PO TABS: 20.0000 mg | ORAL_TABLET | Freq: Every day | ORAL | Status: DC

## 2018-12-20 MED ORDER — ASPIRIN 81 MG PO TBEC: 81.0000 mg | DELAYED_RELEASE_TABLET | Freq: Every day | ORAL | 0 refills | Status: DC

## 2018-12-20 MED ORDER — CITALOPRAM HYDROBROMIDE 20 MG PO TABS: 20.0000 mg | ORAL_TABLET | Freq: Every day | ORAL | 0 refills | Status: DC

## 2018-12-20 MED ORDER — ATORVASTATIN CALCIUM 40 MG PO TABS: 40.0000 mg | ORAL_TABLET | Freq: Every day | ORAL | 1 refills | Status: DC

## 2018-12-20 NOTE — Progress Notes (Signed)
*  PRELIMINARY RESULTS* Echocardiogram 2D Echocardiogram has been performed.  Angel Maddox 12/20/2018, 3:02 PM

## 2018-12-20 NOTE — Evaluation (Signed)
Physical Therapy Evaluation Patient Details Name: Angel Maddox MRN: [id number] DOB: 09-20-1955 Today's Date: 12/20/2018   History of Present Illness  Angel Maddox is a 63 y.o. female with medical history significant for COPD, multiple sclerosis, bipolar disorder, hypertension, who presented to the ED with multiple complaints ongoing over the past 2 to 3 days.  Complaints included chest discomfort, upper extremity weakness and tingling, difficulty swallowing, decreased urination, cough, difficulty breathing, sore throat, nasal congestion, generalized weakness.  Patient traveled to [facility] and came back 3 days ago.  Reportedly symptoms started shortly after coming back.    Clinical Impression  Patient functioning near baseline for functional mobility and gait other than c/o of fatigue after not getting enough sleep while waiting in ED and requested to go back to bed after ambulating in room and hallway without loss of balance using Rollator.  Plan:  Patient discharged from physical therapy to care of nursing for ambulation daily as tolerated for length of stay.     Follow Up Recommendations Home health PT;Supervision for mobility/OOB;Supervision - Intermittent    Equipment Recommendations  None recommended by PT    Recommendations for Other Services       Precautions / Restrictions Precautions Precautions: None Restrictions Weight Bearing Restrictions: No      Mobility  Bed Mobility Overal bed mobility: Modified Independent             General bed mobility comments: increased time  Transfers Overall transfer level: Modified independent Equipment used: 4-wheeled walker             General transfer comment: increased time  Ambulation/Gait Ambulation/Gait assistance: Supervision Gait Distance (Feet): 100 Feet Assistive device: 4-wheeled walker Gait Pattern/deviations: Decreased step length - left;Decreased step length - right;Decreased stride  length Gait velocity: decreased   General Gait Details: slightly labored cadence without loss of balance, good return for using Rollator  Stairs            Wheelchair Mobility    Modified Rankin (Stroke Patients Only)       Balance Overall balance assessment: Needs assistance Sitting-balance support: Feet supported;No upper extremity supported Sitting balance-Leahy Scale: Good     Standing balance support: During functional activity;No upper extremity supported Standing balance-Leahy Scale: Fair Standing balance comment: fair/good using Rollator                             Pertinent Vitals/Pain Pain Assessment: 0-10 Pain Score: 5  Pain Location: neck Pain Descriptors / Indicators: Discomfort;Sore Pain Intervention(s): Limited activity within patient's tolerance;Monitored during session    [place] expects to be discharged to:: Private residence Living Arrangements: Spouse/significant other Available Help at Discharge: Family;Available PRN/intermittently Type of Home: House Home Access: Stairs to enter Entrance Stairs-Rails: None Entrance Stairs-Number of Steps: 1 Home Layout: One level Home Equipment: Walker - 4 wheels;Shower seat;Bedside commode;Wheelchair - manual      Prior Function Level of Independence: Independent with assistive device(s)         Comments: household and short distanced community ambulator with Rollator     Hand Dominance        Extremity/Trunk Assessment   Upper Extremity Assessment Upper Extremity Assessment: Overall WFL for tasks assessed    Lower Extremity Assessment Lower Extremity Assessment: Generalized weakness    Cervical / Trunk Assessment Cervical / Trunk Assessment: Normal  Communication   Communication: No difficulties  Cognition Arousal/Alertness: Awake/alert Behavior During Therapy: [facility]  for tasks assessed/performed Overall Cognitive Status: Within Functional Limits for  tasks assessed                                        General Comments      Exercises     Assessment/Plan    PT Assessment All further PT needs can be met in the next venue of care  PT Problem List Decreased strength;Decreased activity tolerance;Decreased mobility;Decreased balance       PT Treatment Interventions      PT Goals (Current goals can be found in the Care Plan section)  Acute Rehab PT Goals Patient Stated Goal: return home PT Goal Formulation: With patient Time For Goal Achievement: 12/20/18 Potential to Achieve Goals: Good    Frequency     Barriers to discharge        Co-evaluation               AM-PAC PT "6 Clicks" Mobility  Outcome Measure Help needed turning from your back to your side while in a flat bed without using bedrails?: None Help needed moving from lying on your back to sitting on the side of a flat bed without using bedrails?: None Help needed moving to and from a bed to a chair (including a wheelchair)?: None Help needed standing up from a chair using your arms (e.g., wheelchair or bedside chair)?: None Help needed to walk in hospital room?: None Help needed climbing 3-5 steps with a railing? : A Little 6 Click Score: 23    End of Session   Activity Tolerance: Patient tolerated treatment well;Patient limited by fatigue Patient left: in bed;with bed alarm set Nurse Communication: Mobility status PT Visit Diagnosis: Unsteadiness on feet (R26.81);Other abnormalities of gait and mobility (R26.89);Muscle weakness (generalized) (M62.81)    Time: 0086-7619 PT Time Calculation (min) (ACUTE ONLY): 20 min   Charges:   PT Evaluation $PT Eval Moderate Complexity: 1 Mod PT Treatments $Therapeutic Activity: 23-37 mins        11:22 AM, 12/20/18 Lonell Grandchild, MPT Physical Therapist with [facility] [phone] office [phone] mobile phone

## 2018-12-20 NOTE — Discharge Summary (Signed)
Physician Discharge Summary  Angel Maddox DJM:[id number] DOB: Nov 08, 1955 DOA: 12/19/2018  PCP: Terald Sleeper, PA-C  Admit date: 12/19/2018 Discharge date: 12/20/2018  Admitted From: home  Disposition: home   Recommendations for Outpatient Follow-up:  1. Follow up with PCP in 1 weeks 2. Follow up with neurology to establish care regarding cerebrovascular disease and chronic stroke 3. Please obtain BMP/CBC in one week to follow sodium level and potassium level. 4. Pt was taken off HCTZ due to hyponatremia and hypokalemia 5. Pharm D recommended reducing celexa to max dose of 20 mg daily for patient over age 99.  Pt to discuss with her behavioral health provider.   Discharge Condition: STABLE   CODE STATUS: FULL    Brief Hospitalization Summary: Please see all hospital notes, images, labs for full details of the hospitalization. Dr. Talmadge Coventry HPI: Angel Maddox is a 63 y.o. female with medical history significant for COPD, multiple sclerosis, bipolar disorder, hypertension, who presented to the ED with multiple complaints ongoing over the past 2 to 3 days.  Complaints included chest discomfort, upper extremity weakness and tingling, difficulty swallowing, decreased urination, cough, difficulty breathing, sore throat, nasal congestion, generalized weakness.  Patient traveled to [facility] and came back 3 days ago.  Reportedly symptoms started shortly after coming back.  On my evaluation and with further detailed questioning, most of her symptoms are unchanged except for her cough, she reports she has chronic cough but over the past 2 days her cough has worsened and is dry. She reports associated wheezing a few days ago-has resolved,, she has chronic difficulty breathing that has gradually progressed over the past year but basically unchanged over the past several days.  She is not on home O2.  She reports chest tightness mostly from difficulty breathing.  She has no cardiac history.  No  Fever or chills.  She has chronic mild sore throat and chronic difficulty swallowing requiring her to chew her food more to avoid choking.  She has chronic right upper extremity weakness with tingling, but over the past few weeks her left upper extremity is having similar symptoms of weakness and tingling.  She has chronic lower extremity weakness, she is working with a physical therapist and her strength has improved, she ambulates with a walker.  No change in vision or speech, no facial asymmetry. She reports poor p.o. intake over the past 1 day, no vomiting, occasional ~2 loose stools/ daily-also chronic.  ED Course: Stable vitals.  WBC 4.2.  Normal lactic acid 1.3.  BNP, magnesium, d-dimer-within normal limits.  Unremarkable portable chest x-ray.  EKG sinus rhythm without ST or T wave changes.  Sodium low 124, potassium 3.3.  Chronically elevated ALP 138. EDP talked to Dr. Merlene Laughter with concern for MS flare, MRI brain with and without contrast recommended, ordered pending results.  Patient given 1 L bolus normal saline in the ED hospitalist called to admit for hyponatremia and hypokalemia.  The patient was admitted for generalized weakness and malaise. She was found to have hyponatremia with a sodium level of 124.  She was found to be hypokalemic as well.  She had been taking HCTZ for hypertension.  Upon further evaluation for possible MS flare she received an MRI with and without contrast of the brain with findings of no evidence of active demyelination.  She was noted to have a remote small right thalamic lacunar infarct that was chronic in appearance but new relative to 2019.  Because of these findings.  She  had a stroke work-up in the hospital including an echocardiogram, carotid Dopplers and PT and SLP evaluation.  She was found to have dyslipidemia with a total cholesterol greater than 300 and LDL cholesterol greater than 200.  She was started on atorvastatin 40 mg daily.  She was started on aspirin  81 mg daily for antiplatelet therapy.  For the hyponatremia she was started on normal saline infusion and her HCTZ was discontinued.  Her potassium was corrected.  Repeat BMP shows an improvement of sodium to 128.  She is totally asymptomatic now and feeling better and feeling at her baseline and would like to go home.  I have recommended that she follow-up with outpatient neurologist for evaluation and stroke follow-up in 1 month.  I have asked for her to discuss with her behavioral health providers if she will be able to reduce citalopram to 20 mg daily as recommended by the Pharm.D. upon reviewing her medications.  In addition, I have asked her to discontinue HCTZ.  We have started her on amlodipine 5 mg daily for blood pressure management.  I would like for her to follow-up with her primary care provider in 1 week.  Her carotid Doppler studies did not show any hemodynamically significant blockages.  Her echocardiogram noted below without any significant abnormalities.  I did counsel her that is very important for her to get her lipids under better control and she is willing to try atorvastatin.  I recommended close follow-up with her primary care provider, neurologist and she verbalized understanding.   Discharge Diagnoses:  Active Problems:   Bipolar 1 disorder, depressed (HCC)   MS (multiple sclerosis) (HCC)   Hypokalemia   COPD (chronic obstructive pulmonary disease) (HCC)   Hyponatremia   Dyslipidemia   Discharge Instructions: Discharge Instructions    Call MD for:  difficulty breathing, headache or visual disturbances   Complete by: As directed    Call MD for:  extreme fatigue   Complete by: As directed    Call MD for:  persistant dizziness or light-headedness   Complete by: As directed    Call MD for:  persistant nausea and vomiting   Complete by: As directed      Allergies as of 12/20/2018      Reactions   Hydrocodone Itching      Medication List    STOP taking these  medications   hydrochlorothiazide 25 MG tablet Commonly known as: HYDRODIURIL     TAKE these medications   amLODipine 5 MG tablet Commonly known as: NORVASC Take 1 tablet (5 mg total) by mouth daily. Start taking on: December 21, 2018   aspirin 81 MG EC tablet Take 1 tablet (81 mg total) by mouth daily. Start taking on: December 21, 2018   atorvastatin 40 MG tablet Commonly known as: LIPITOR Take 1 tablet (40 mg total) by mouth daily at 6 PM.   benzonatate 200 MG capsule Commonly known as: TESSALON Take 1 capsule (200 mg total) by mouth 2 (two) times daily as needed for cough.   buPROPion 200 MG 12 hr tablet Commonly known as: WELLBUTRIN SR Take 1 tablet (200 mg total) by mouth 2 (two) times daily.   citalopram 20 MG tablet Commonly known as: CELEXA Take 1 tablet (20 mg total) by mouth daily. What changed:   medication strength  how much to take   clonazePAM 0.5 MG tablet Commonly known as: KLONOPIN Take 1 tablet (0.5 mg total) by mouth at bedtime as needed for anxiety.  Compressor/Nebulizer Misc Use as directed   famotidine 20 MG tablet Commonly known as: PEPCID Take 20 mg by mouth at bedtime.   gabapentin 800 MG tablet Commonly known as: NEURONTIN Take 400 mg by mouth 3 (three) times daily. Takes half tablet   ipratropium-albuterol 0.5-2.5 (3) MG/3ML Soln Commonly known as: DUONEB 1 neb every 6 hours as needed for help breathing   mirtazapine 15 MG tablet Commonly known as: REMERON Take 0.5-1 tablets (7.5-15 mg total) by mouth at bedtime as needed.   ondansetron 4 MG disintegrating tablet Commonly known as: ZOFRAN-ODT TAKE 1 TABLET BY MOUTH EVERY 8 HOURS AS NEEDED FOR NAUSEA AND VOMITING   Oxycodone HCl 10 MG Tabs Take 1 tablet (10 mg total) by mouth every 6 (six) hours as needed ((score 7 to 10)).   pantoprazole 40 MG tablet Commonly known as: PROTONIX TAKE 1 TABLET (40 MG TOTAL) BY MOUTH DAILY. TAKE 30-60 MIN BEFORE FIRST MEAL OF THE DAY   primidone  250 MG tablet Commonly known as: MYSOLINE Take 250 mg by mouth 4 (four) times daily.   ProAir HFA 108 (90 Base) MCG/ACT inhaler Generic drug: albuterol INHALE 2 PUFFS INTO THE LUNGS EVERY 4 (FOUR) HOURS AS NEEDED FOR WHEEZING OR SHORTNESS OF BREATH.   riTUXimab 100 MG/10ML injection Commonly known as: RITUXAN Inject into the vein.   sucralfate 1 GM/10ML suspension Commonly known as: CARAFATE Take 10 mLs (1 g total) by mouth every 8 (eight) hours as needed.   tizanidine 2 MG capsule Commonly known as: ZANAFLEX Take 2 mg by mouth daily.   umeclidinium-vilanterol 62.5-25 MCG/INH Aepb Commonly known as: Anoro Ellipta Inhale 1 puff into the lungs daily.   valACYclovir 1000 MG tablet Commonly known as: VALTREX Take 1 tablet (1,000 mg total) by mouth daily. What changed: See the new instructions.      Follow-up Information    Terald Sleeper, PA-C. Schedule an appointment as soon as possible for a visit in 1 week(s).   Specialties: Physician Assistant, Family Medicine Why: Hospital Follow Up  Contact information: [place] [address] [phone]        Phillips Odor, MD. Schedule an appointment as soon as possible for a visit in 1 month(s).   Specialty: Neurology Why: Hospital Follow Up Stroke Contact information: [address] [place] [address] [phone]          Allergies  Allergen Reactions  . Hydrocodone Itching   Allergies as of 12/20/2018      Reactions   Hydrocodone Itching      Medication List    STOP taking these medications   hydrochlorothiazide 25 MG tablet Commonly known as: HYDRODIURIL     TAKE these medications   amLODipine 5 MG tablet Commonly known as: NORVASC Take 1 tablet (5 mg total) by mouth daily. Start taking on: December 21, 2018   aspirin 81 MG EC tablet Take 1 tablet (81 mg total) by mouth daily. Start taking on: December 21, 2018   atorvastatin 40 MG tablet Commonly known as: LIPITOR Take 1 tablet  (40 mg total) by mouth daily at 6 PM.   benzonatate 200 MG capsule Commonly known as: TESSALON Take 1 capsule (200 mg total) by mouth 2 (two) times daily as needed for cough.   buPROPion 200 MG 12 hr tablet Commonly known as: WELLBUTRIN SR Take 1 tablet (200 mg total) by mouth 2 (two) times daily.   citalopram 20 MG tablet Commonly known as: CELEXA Take 1  tablet (20 mg total) by mouth daily. What changed:   medication strength  how much to take   clonazePAM 0.5 MG tablet Commonly known as: KLONOPIN Take 1 tablet (0.5 mg total) by mouth at bedtime as needed for anxiety.   Compressor/Nebulizer Misc Use as directed   famotidine 20 MG tablet Commonly known as: PEPCID Take 20 mg by mouth at bedtime.   gabapentin 800 MG tablet Commonly known as: NEURONTIN Take 400 mg by mouth 3 (three) times daily. Takes half tablet   ipratropium-albuterol 0.5-2.5 (3) MG/3ML Soln Commonly known as: DUONEB 1 neb every 6 hours as needed for help breathing   mirtazapine 15 MG tablet Commonly known as: REMERON Take 0.5-1 tablets (7.5-15 mg total) by mouth at bedtime as needed.   ondansetron 4 MG disintegrating tablet Commonly known as: ZOFRAN-ODT TAKE 1 TABLET BY MOUTH EVERY 8 HOURS AS NEEDED FOR NAUSEA AND VOMITING   Oxycodone HCl 10 MG Tabs Take 1 tablet (10 mg total) by mouth every 6 (six) hours as needed ((score 7 to 10)).   pantoprazole 40 MG tablet Commonly known as: PROTONIX TAKE 1 TABLET (40 MG TOTAL) BY MOUTH DAILY. TAKE 30-60 MIN BEFORE FIRST MEAL OF THE DAY   primidone 250 MG tablet Commonly known as: MYSOLINE Take 250 mg by mouth 4 (four) times daily.   ProAir HFA 108 (90 Base) MCG/ACT inhaler Generic drug: albuterol INHALE 2 PUFFS INTO THE LUNGS EVERY 4 (FOUR) HOURS AS NEEDED FOR WHEEZING OR SHORTNESS OF BREATH.   riTUXimab 100 MG/10ML injection Commonly known as: RITUXAN Inject into the vein.   sucralfate 1 GM/10ML suspension Commonly known as: CARAFATE Take 10  mLs (1 g total) by mouth every 8 (eight) hours as needed.   tizanidine 2 MG capsule Commonly known as: ZANAFLEX Take 2 mg by mouth daily.   umeclidinium-vilanterol 62.5-25 MCG/INH Aepb Commonly known as: Anoro Ellipta Inhale 1 puff into the lungs daily.   valACYclovir 1000 MG tablet Commonly known as: VALTREX Take 1 tablet (1,000 mg total) by mouth daily. What changed: See the new instructions.       Procedures/Studies: echocardiogram  IMPRESSIONS  1. The left ventricle has normal systolic function with an ejection fraction of 60-65%. The cavity size was normal. Left ventricular diastolic parameters were normal.  2. The right ventricle has normal systolic function. The cavity was normal. There is no increase in right ventricular wall thickness.  3. No evidence of mitral valve stenosis.  4. No stenosis of the aortic valve.  5. The aortic root is normal in size and structure.  6. The inferior vena cava was normal in size with <50% respiratory variability.  7. The interatrial septum was not well visualized.    Mr Jeri Cos And Wo Contrast  Result Date: 12/19/2018 CLINICAL DATA:  Initial evaluation for acute generalized weakness, history of a mass. EXAM: MRI HEAD WITHOUT AND WITH CONTRAST TECHNIQUE: Multiplanar, multiecho pulse sequences of the brain and surrounding structures were obtained without and with intravenous contrast. CONTRAST:  60 cc of Gadavist. COMPARISON:  Prior brain MRI from 03/27/2018. FINDINGS: Brain: Mild age-related cerebral atrophy, stable. Patchy T2/FLAIR hyperintensity within the periventricular, deep, and subcortical white matter both cerebral hemispheres again seen, nonspecific. Patchy signal abnormality present within the pons. Overall, changes are nonspecific, but moderate nature. Appearance is relatively unchanged from previous without evidence for significant progression. No evidence for active demyelination. Small remote lacunar infarct present within the  right thalamus, chronic in appearance, but new from previous. No  abnormal foci of restricted diffusion to suggest acute or subacute ischemia. Gray-white matter differentiation maintained. No other areas of remote cortical infarction. Prominent dilated perivascular spaces noted within the inferior basal ganglia. No evidence for acute or chronic intracranial hemorrhage. No mass lesion, midline shift or mass effect. No hydrocephalus. No extra-axial fluid collection. Pituitary gland within normal limits. No abnormal enhancement. Vascular: Major intracranial vascular flow voids are maintained. Skull and upper cervical spine: Craniocervical junction within normal limits. Upper cervical spine normal. Bone marrow signal intensity within normal limits. No scalp soft tissue abnormality. Sinuses/Orbits: Globes and orbital soft tissues within normal limits. Paranasal sinuses are largely clear. No significant mastoid effusion. Inner ear structures normal. Other: None. IMPRESSION: 1. Moderately advanced cerebral white matter changes for age, nonspecific, but relatively stable from previous without evidence for significant progression. No evidence for active demyelination. 2. No other acute intracranial abnormality identified. 3. Small remote right thalamic lacunar infarct, chronic in appearance, but new relative to 2019. Electronically Signed   By: Jeannine Boga M.D.   On: 12/19/2018 18:03   US Carotid Bilateral  Result Date: 12/20/2018 CLINICAL DATA:  CVA.  Upper extremity weakness. EXAM: BILATERAL CAROTID DUPLEX ULTRASOUND TECHNIQUE: Pearline Cables scale imaging, color Doppler and duplex ultrasound were performed of bilateral carotid and vertebral arteries in the neck. COMPARISON:  None. FINDINGS: Criteria: Quantification of carotid stenosis is based on velocity parameters that correlate the residual internal carotid diameter with NASCET-based stenosis levels, using the diameter of the distal internal carotid lumen as the  denominator for stenosis measurement. The following velocity measurements were obtained: RIGHT ICA: 56/20 cm/sec CCA: 77/82 cm/sec SYSTOLIC ICA/CCA RATIO:  0.6 ECA: 48 cm/sec LEFT ICA: 47/19 cm/sec CCA: 42/35 cm/sec SYSTOLIC ICA/CCA RATIO:  0.6 ECA: 55 cm/sec RIGHT CAROTID ARTERY: Small amount of plaque in the mid common carotid artery. Intimal thickening or minimal plaque at the right carotid bulb. External carotid artery is patent with normal waveform. Normal waveforms and velocities in the internal carotid artery. RIGHT VERTEBRAL ARTERY: Antegrade flow and normal waveform in the right vertebral artery. LEFT CAROTID ARTERY: Small amount of plaque in the left common carotid artery. External carotid artery is patent with normal waveform. Minimal plaque in the proximal internal carotid artery. Normal waveforms and velocities in the internal carotid artery. LEFT VERTEBRAL ARTERY: Antegrade flow and normal waveform in the left vertebral artery. IMPRESSION: Mild atherosclerotic disease in the carotid arteries. Estimated degree of stenosis in the internal carotid arteries is less than 50% bilaterally. Bilateral vertebral arteries are patent with antegrade flow. Electronically Signed   By: [name] M.D.   On: 12/20/2018 11:32   Dg Chest Port 1 View  Result Date: 12/19/2018 CLINICAL DATA:  Worsening cough. EXAM: PORTABLE CHEST 1 VIEW COMPARISON:  09/17/2018. FINDINGS: Mediastinum and hilar structures normal. Lungs are clear. No pleural effusion pneumothorax. Heart size normal. Cervical spine fusion. IMPRESSION: No acute abnormality. Electronically Signed   By: Marcello Moores  Register   On: 12/19/2018 13:33      Subjective: Pt says that she is feeling much better today.  She wants to go home.    Discharge Exam: Vitals:   12/20/18 0844 12/20/18 1519  BP:    Pulse:    Resp:    Temp:    SpO2: 98% 98%   Vitals:   12/19/18 2342 12/20/18 0455 12/20/18 0844 12/20/18 1519  BP:  (!) 128/95    Pulse:  63    Resp:   18    Temp:  98.1 F (  36.7 C)    TempSrc:  Oral    SpO2: 95% 97% 98% 98%  Weight:      Height:        General: Pt is alert, awake, not in acute distress Cardiovascular: RRR, S1/S2 +, no rubs, no gallops Respiratory: CTA bilaterally, no wheezing, no rhonchi Abdominal: Soft, NT, ND, bowel sounds + Extremities: no edema, no cyanosis   The results of significant diagnostics from this hospitalization (including imaging, microbiology, ancillary and laboratory) are listed below for reference.     Microbiology: Recent Results (from the past 240 hour(s))  SARS Coronavirus 2 (CEPHEID- Performed in [facility] hospital lab), Hosp Order     Status: None   Collection Time: 12/19/18  1:57 PM   Specimen: Nasopharyngeal Swab  Result Value Ref Range Status   SARS Coronavirus 2 NEGATIVE NEGATIVE Final    Comment: (NOTE) If result is NEGATIVE SARS-CoV-2 target nucleic acids are NOT DETECTED. The SARS-CoV-2 RNA is generally detectable in upper and lower  respiratory specimens during the acute phase of infection. The lowest  concentration of SARS-CoV-2 viral copies this assay can detect is 250  copies / mL. A negative result does not preclude SARS-CoV-2 infection  and should not be used as the sole basis for treatment or other  patient management decisions.  A negative result may occur with  improper specimen collection / handling, submission of specimen other  than nasopharyngeal swab, presence of viral mutation(s) within the  areas targeted by this assay, and inadequate number of viral copies  (<250 copies / mL). A negative result must be combined with clinical  observations, patient history, and epidemiological information. If result is POSITIVE SARS-CoV-2 target nucleic acids are DETECTED. The SARS-CoV-2 RNA is generally detectable in upper and lower  respiratory specimens dur ing the acute phase of infection.  Positive  results are indicative of active infection with SARS-CoV-2.  Clinical   correlation with patient history and other diagnostic information is  necessary to determine patient infection status.  Positive results do  not rule out bacterial infection or co-infection with other viruses. If result is PRESUMPTIVE POSTIVE SARS-CoV-2 nucleic acids MAY BE PRESENT.   A presumptive positive result was obtained on the submitted specimen  and confirmed on repeat testing.  While 2019 novel coronavirus  (SARS-CoV-2) nucleic acids may be present in the submitted sample  additional confirmatory testing may be necessary for epidemiological  and / or clinical management purposes  to differentiate between  SARS-CoV-2 and other Sarbecovirus currently known to infect humans.  If clinically indicated additional testing with an alternate test  methodology [phone]) is advised. The SARS-CoV-2 RNA is generally  detectable in upper and lower respiratory sp ecimens during the acute  phase of infection. The expected result is Negative. Fact Sheet for Patients:  StrictlyIdeas.no Fact Sheet for Healthcare Providers: BankingDealers.co.za This test is not yet approved or cleared by the [place] FDA and has been authorized for detection and/or diagnosis of SARS-CoV-2 by FDA under an Emergency Use Authorization (EUA).  This EUA will remain in effect (meaning this test can be used) for the duration of the COVID-19 declaration under Section 564(b)(1) of the Act, 21 U.S.C. section 360bbb-3(b)(1), unless the authorization is terminated or revoked sooner. Performed at [facility], [address] [place], [place] [address]   Culture, blood (routine x 2)     Status: None (Preliminary result)   Collection Time: 12/19/18  1:57 PM   Specimen: BLOOD  Result Value Ref Range  Status   Specimen Description BLOOD SITE NOT SPECIFIED  Final   Special Requests   Final    BOTTLES DRAWN AEROBIC AND ANAEROBIC Blood Culture adequate volume   Culture   Final     NO GROWTH < 24 HOURS Performed at [facility], [address], [place], [place] [address]    Report Status PENDING  Incomplete  Culture, blood (routine x 2)     Status: None (Preliminary result)   Collection Time: 12/19/18  1:57 PM   Specimen: BLOOD RIGHT ARM  Result Value Ref Range Status   Specimen Description BLOOD RIGHT ARM  Final   Special Requests   Final    BOTTLES DRAWN AEROBIC AND ANAEROBIC Blood Culture adequate volume   Culture   Final    NO GROWTH < 24 HOURS Performed at [facility], [address], [place], [place] [address]    Report Status PENDING  Incomplete  Respiratory Panel by PCR     Status: None   Collection Time: 12/19/18  5:57 PM   Specimen: Nasopharyngeal Swab; Respiratory  Result Value Ref Range Status   Adenovirus NOT DETECTED NOT DETECTED Final   Coronavirus 229E NOT DETECTED NOT DETECTED Final    Comment: (NOTE) The Coronavirus on the Respiratory Panel, DOES NOT test for the novel  Coronavirus (2019 nCoV)    Coronavirus HKU1 NOT DETECTED NOT DETECTED Final   Coronavirus NL63 NOT DETECTED NOT DETECTED Final   Coronavirus OC43 NOT DETECTED NOT DETECTED Final   Metapneumovirus NOT DETECTED NOT DETECTED Final   Rhinovirus / Enterovirus NOT DETECTED NOT DETECTED Final   Influenza A NOT DETECTED NOT DETECTED Final   Influenza B NOT DETECTED NOT DETECTED Final   Parainfluenza Virus 1 NOT DETECTED NOT DETECTED Final   Parainfluenza Virus 2 NOT DETECTED NOT DETECTED Final   Parainfluenza Virus 3 NOT DETECTED NOT DETECTED Final   Parainfluenza Virus 4 NOT DETECTED NOT DETECTED Final   Respiratory Syncytial Virus NOT DETECTED NOT DETECTED Final   Bordetella pertussis NOT DETECTED NOT DETECTED Final   Chlamydophila pneumoniae NOT DETECTED NOT DETECTED Final   Mycoplasma pneumoniae NOT DETECTED NOT DETECTED Final    Comment: Performed at [facility], [place] [address] [place], [place] [address]     Labs: BNP (last 3 results) Recent Labs     12/19/18 1438  BNP 02.4   Basic Metabolic Panel: Recent Labs  Lab 12/19/18 1357 12/19/18 1438 12/20/18 0559  NA 124*  --  128*  K 3.3*  --  3.8  CL 84*  --  92*  CO2 26  --  26  GLUCOSE 88  --  116*  BUN 10  --  9  CREATININE 0.72  --  0.74  CALCIUM 8.9  --  8.7*  MG  --  2.1  --    Liver Function Tests: Recent Labs  Lab 12/19/18 1357  AST 29  ALT 27  ALKPHOS 138*  BILITOT 0.5  PROT 7.1  ALBUMIN 4.3   No results for input(s): LIPASE, AMYLASE in the last 168 hours. No results for input(s): AMMONIA in the last 168 hours. CBC: Recent Labs  Lab 12/19/18 1357  WBC 4.2  NEUTROABS 2.1  HGB 13.0  HCT 37.7  MCV 94.5  PLT 268   Cardiac Enzymes: No results for input(s): CKTOTAL, CKMB, CKMBINDEX, TROPONINI in the last 168 hours. BNP: Invalid input(s): POCBNP CBG: No results for input(s): GLUCAP in the last 168 hours. D-Dimer Recent Labs  12/19/18 1527  DDIMER <0.27   Hgb A1c No results for input(s): HGBA1C in the last 72 hours. Lipid Profile Recent Labs    12/20/18 0559  CHOL 315*  HDL 93  LDLCALC 207*  TRIG 76  CHOLHDL 3.4   Thyroid function studies Recent Labs    12/19/18 1522  TSH 0.861   Anemia work up No results for input(s): VITAMINB12, FOLATE, FERRITIN, TIBC, IRON, RETICCTPCT in the last 72 hours. Urinalysis    Component Value Date/Time   COLORURINE STRAW (A) 12/19/2018 1246   APPEARANCEUR CLEAR 12/19/2018 1246   LABSPEC 1.005 12/19/2018 1246   PHURINE 7.0 12/19/2018 1246   GLUCOSEU NEGATIVE 12/19/2018 1246   HGBUR NEGATIVE 12/19/2018 [place] 12/19/2018 [place] 12/19/2018 [place] 12/19/2018 1246   NITRITE NEGATIVE 12/19/2018 [place] 12/19/2018 1246   Sepsis Labs Invalid input(s): PROCALCITONIN,  WBC,  LACTICIDVEN Microbiology Recent Results (from the past 240 hour(s))  SARS Coronavirus 2 (CEPHEID- Performed in [place] hospital lab), Hosp Order      Status: None   Collection Time: 12/19/18  1:57 PM   Specimen: Nasopharyngeal Swab  Result Value Ref Range Status   SARS Coronavirus 2 NEGATIVE NEGATIVE Final    Comment: (NOTE) If result is NEGATIVE SARS-CoV-2 target nucleic acids are NOT DETECTED. The SARS-CoV-2 RNA is generally detectable in upper and lower  respiratory specimens during the acute phase of infection. The lowest  concentration of SARS-CoV-2 viral copies this assay can detect is 250  copies / mL. A negative result does not preclude SARS-CoV-2 infection  and should not be used as the sole basis for treatment or other  patient management decisions.  A negative result may occur with  improper specimen collection / handling, submission of specimen other  than nasopharyngeal swab, presence of viral mutation(s) within the  areas targeted by this assay, and inadequate number of viral copies  (<250 copies / mL). A negative result must be combined with clinical  observations, patient history, and epidemiological information. If result is POSITIVE SARS-CoV-2 target nucleic acids are DETECTED. The SARS-CoV-2 RNA is generally detectable in upper and lower  respiratory specimens dur ing the acute phase of infection.  Positive  results are indicative of active infection with SARS-CoV-2.  Clinical  correlation with patient history and other diagnostic information is  necessary to determine patient infection status.  Positive results do  not rule out bacterial infection or co-infection with other viruses. If result is PRESUMPTIVE POSTIVE SARS-CoV-2 nucleic acids MAY BE PRESENT.   A presumptive positive result was obtained on the submitted specimen  and confirmed on repeat testing.  While 2019 novel coronavirus  (SARS-CoV-2) nucleic acids may be present in the submitted sample  additional confirmatory testing may be necessary for epidemiological  and / or clinical management purposes  to differentiate between  SARS-CoV-2 and other  Sarbecovirus currently known to infect humans.  If clinically indicated additional testing with an alternate test  methodology [phone]) is advised. The SARS-CoV-2 RNA is generally  detectable in upper and lower respiratory sp ecimens during the acute  phase of infection. The expected result is Negative. Fact Sheet for Patients:  StrictlyIdeas.no Fact Sheet for Healthcare Providers: BankingDealers.co.za This test is not yet approved or cleared by the [place] FDA and has been authorized for detection and/or diagnosis of SARS-CoV-2 by FDA under an Emergency Use Authorization (EUA).  This EUA will remain in effect (meaning this  test can be used) for the duration of the COVID-19 declaration under Section 564(b)(1) of the Act, 21 U.S.C. section 360bbb-3(b)(1), unless the authorization is terminated or revoked sooner. Performed at [facility], [address], [place], [place] [address]   Culture, blood (routine x 2)     Status: None (Preliminary result)   Collection Time: 12/19/18  1:57 PM   Specimen: BLOOD  Result Value Ref Range Status   Specimen Description BLOOD SITE NOT SPECIFIED  Final   Special Requests   Final    BOTTLES DRAWN AEROBIC AND ANAEROBIC Blood Culture adequate volume   Culture   Final    NO GROWTH < 24 HOURS Performed at [facility], [address], [place], [place] [address]    Report Status PENDING  Incomplete  Culture, blood (routine x 2)     Status: None (Preliminary result)   Collection Time: 12/19/18  1:57 PM   Specimen: BLOOD RIGHT ARM  Result Value Ref Range Status   Specimen Description BLOOD RIGHT ARM  Final   Special Requests   Final    BOTTLES DRAWN AEROBIC AND ANAEROBIC Blood Culture adequate volume   Culture   Final    NO GROWTH < 24 HOURS Performed at [facility], [address], [place], [place] [address]    Report Status PENDING  Incomplete  Respiratory Panel by PCR     Status: None    Collection Time: 12/19/18  5:57 PM   Specimen: Nasopharyngeal Swab; Respiratory  Result Value Ref Range Status   Adenovirus NOT DETECTED NOT DETECTED Final   Coronavirus 229E NOT DETECTED NOT DETECTED Final    Comment: (NOTE) The Coronavirus on the Respiratory Panel, DOES NOT test for the novel  Coronavirus (2019 nCoV)    Coronavirus HKU1 NOT DETECTED NOT DETECTED Final   Coronavirus NL63 NOT DETECTED NOT DETECTED Final   Coronavirus OC43 NOT DETECTED NOT DETECTED Final   Metapneumovirus NOT DETECTED NOT DETECTED Final   Rhinovirus / Enterovirus NOT DETECTED NOT DETECTED Final   Influenza A NOT DETECTED NOT DETECTED Final   Influenza B NOT DETECTED NOT DETECTED Final   Parainfluenza Virus 1 NOT DETECTED NOT DETECTED Final   Parainfluenza Virus 2 NOT DETECTED NOT DETECTED Final   Parainfluenza Virus 3 NOT DETECTED NOT DETECTED Final   Parainfluenza Virus 4 NOT DETECTED NOT DETECTED Final   Respiratory Syncytial Virus NOT DETECTED NOT DETECTED Final   Bordetella pertussis NOT DETECTED NOT DETECTED Final   Chlamydophila pneumoniae NOT DETECTED NOT DETECTED Final   Mycoplasma pneumoniae NOT DETECTED NOT DETECTED Final    Comment: Performed at [facility], [place] [address] [place], [place] [address]   Time coordinating discharge:   SIGNED:  Irwin Brakeman, MD  [facility] 12/20/2018, 4:24 PM How to contact the [facility] Attending or Consulting provider [place] or covering provider during after hours [place], for this patient?  1. Check the care team in [facility] and look for a) attending/consulting TRH provider listed and b) the [facility] team listed 2. Log into [email or web address] and use [place]'s universal password to access. If you do not have the password, please contact the hospital operator. 3. Locate the [facility] provider you are looking for under [facility] and page to a number that you can be directly reached. 4. If you still have difficulty reaching the provider, please page  the [facility] (Director on Call) for the Hospitalists listed on amion for assistance.

## 2018-12-20 NOTE — Plan of Care (Signed)
  Problem: Education: Goal: Knowledge of General Education information will improve Description Including pain rating scale, medication(s)/side effects and non-pharmacologic comfort measures Outcome: Progressing   Problem: Health Behavior/Discharge Planning: Goal: Ability to manage health-related needs will improve Outcome: Progressing   

## 2018-12-20 NOTE — Care Management Obs Status (Signed)
[place] NOTIFICATION   Patient Details  Name: Angel Maddox MRN: [id number] Date of Birth: January 26, 1956   Medicare Observation Status Notification Given:  Yes    Shade Flood, LCSW 12/20/2018, 10:48 AM

## 2018-12-20 NOTE — Discharge Instructions (Signed)
PLEASE DISCUSS WITH YOUR BEHAVIORAL HEALTH PROVIDER ABOUT REDUCING DOSE OF CELEXA (CITALOPRAM) TO MAX DOSE OF 20 MG DUE TO AGE >60 PER PHARMACIST RECOMMENDATIONS     IMPORTANT INFORMATION: PAY CLOSE ATTENTION   PHYSICIAN DISCHARGE INSTRUCTIONS  Follow with Primary care provider  Terald Sleeper, PA-C  and other consultants as instructed by your Hospitalist Physician  [place] IF SYMPTOMS COME BACK, WORSEN OR NEW PROBLEM DEVELOPS   Please note: You were cared for by a hospitalist during your hospital stay. Every effort will be made to forward records to your primary care provider.  You can request that your primary care provider send for your hospital records if they have not received them.  Once you are discharged, your primary care physician will handle any further medical issues. Please note that NO REFILLS for any discharge medications will be authorized once you are discharged, as it is imperative that you return to your primary care physician (or establish a relationship with a primary care physician if you do not have one) for your post hospital discharge needs so that they can reassess your need for medications and monitor your lab values.  Please get a complete blood count and chemistry panel checked by your Primary MD at your next visit, and again as instructed by your Primary MD.  Get Medicines reviewed and adjusted: Please take all your medications with you for your next visit with your Primary MD  Laboratory/radiological data: Please request your Primary MD to go over all hospital tests and procedure/radiological results at the follow up, please ask your primary care provider to get all Hospital records sent to his/her office.  In some cases, they will be blood work, cultures and biopsy results pending at the time of your discharge. Please request that your primary care provider follow up on these results.  If you are diabetic, please bring  your blood sugar readings with you to your follow up appointment with primary care.    Please call and make your follow up appointments as soon as possible.    Also Note the following: If you experience worsening of your admission symptoms, develop shortness of breath, life threatening emergency, suicidal or homicidal thoughts you must seek medical attention immediately by calling 911 or calling your MD immediately  if symptoms less severe.  You must read complete instructions/literature along with all the possible adverse reactions/side effects for all the Medicines you take and that have been prescribed to you. Take any new Medicines after you have completely understood and accpet all the possible adverse reactions/side effects.   Do not drive when taking Pain medications or sleeping medications (Benzodiazepines)  Do not take more than prescribed Pain, Sleep and Anxiety Medications. It is not advisable to combine anxiety,sleep and pain medications without talking with your primary care practitioner  Special Instructions: If you have smoked or chewed Tobacco  in the last 2 yrs please stop smoking, stop any regular Alcohol  and or any Recreational drug use.  Wear Seat belts while driving.  Do not drive if taking any narcotic, mind altering or controlled substances or recreational drugs or alcohol.

## 2018-12-20 NOTE — TOC Transition Note (Signed)
Transition of Care [facility]) - CM/SW Discharge Note   Patient Details  Name: Angel Maddox MRN: [id number] Date of Birth: 06-15-1956  Transition of [facility]) CM/SW Contact:  Shade Flood, LCSW Phone Number: 12/20/2018, 10:51 AM   Clinical Narrative:     PT recommending HH PT at dc. Spoke with pt to discuss. Per pt, she is already receiving [place] PT from [facility]. Spoke with staff at [facility] to update. Anticipate pt will return home at dc with continued care from [facility]. Pt states she does not expect to have any other TOC needs at dc.   Final next level of care: [place] Barriers to Discharge: Continued Medical Work up   Patient Goals and CMS Choice        Discharge Placement                       Discharge Plan and Services In-house Referral: Clinical Social Work                          [facility] Agency: [facility]        Social Determinants of Health (SDOH) Interventions     Readmission Risk Interventions No flowsheet data found.

## 2018-12-20 NOTE — Plan of Care (Signed)
  Problem: Education: Goal: Knowledge of General Education information will improve Description: Including pain rating scale, medication(s)/side effects and non-pharmacologic comfort measures 12/20/2018 1653 by Santa Lighter, RN Outcome: Adequate for Discharge 12/20/2018 1023 by Santa Lighter, RN Outcome: Progressing 12/20/2018 0829 by Santa Lighter, RN Outcome: Progressing   Problem: Health Behavior/Discharge Planning: Goal: Ability to manage health-related needs will improve 12/20/2018 1653 by Santa Lighter, RN Outcome: Adequate for Discharge 12/20/2018 0829 by Santa Lighter, RN Outcome: Progressing   Problem: Clinical Measurements: Goal: Ability to maintain clinical measurements within normal limits will improve Outcome: Adequate for Discharge   Problem: Clinical Measurements: Goal: Will remain free from infection Outcome: Adequate for Discharge   Problem: Clinical Measurements: Goal: Diagnostic test results will improve Outcome: Adequate for Discharge   Problem: Clinical Measurements: Goal: Respiratory complications will improve Outcome: Adequate for Discharge

## 2018-12-20 NOTE — Progress Notes (Signed)
Nsg Discharge Note  Admit Date:  12/19/2018 Discharge date: 12/20/2018   Angel Maddox to be D/C'd Home per MD order.  AVS completed.  Copy for chart, and copy for patient signed, and dated. IV removed-clean, dry, intact. Patient/caregiver able to verbalize understanding. Wheeled stable patient and belongings to short stay entrance where she was picked up by her husband.  Discharge Medication: Allergies as of 12/20/2018      Reactions   Hydrocodone Itching      Medication List    STOP taking these medications   hydrochlorothiazide 25 MG tablet Commonly known as: HYDRODIURIL     TAKE these medications   amLODipine 5 MG tablet Commonly known as: NORVASC Take 1 tablet (5 mg total) by mouth daily. Start taking on: December 21, 2018   aspirin 81 MG EC tablet Take 1 tablet (81 mg total) by mouth daily. Start taking on: December 21, 2018   atorvastatin 40 MG tablet Commonly known as: LIPITOR Take 1 tablet (40 mg total) by mouth daily at 6 PM.   benzonatate 200 MG capsule Commonly known as: TESSALON Take 1 capsule (200 mg total) by mouth 2 (two) times daily as needed for cough.   buPROPion 200 MG 12 hr tablet Commonly known as: WELLBUTRIN SR Take 1 tablet (200 mg total) by mouth 2 (two) times daily.   citalopram 20 MG tablet Commonly known as: CELEXA Take 1 tablet (20 mg total) by mouth daily. What changed:   medication strength  how much to take   clonazePAM 0.5 MG tablet Commonly known as: KLONOPIN Take 1 tablet (0.5 mg total) by mouth at bedtime as needed for anxiety.   Compressor/Nebulizer Misc Use as directed   famotidine 20 MG tablet Commonly known as: PEPCID Take 20 mg by mouth at bedtime.   gabapentin 800 MG tablet Commonly known as: NEURONTIN Take 400 mg by mouth 3 (three) times daily. Takes half tablet   ipratropium-albuterol 0.5-2.5 (3) MG/3ML Soln Commonly known as: DUONEB 1 neb every 6 hours as needed for help breathing   mirtazapine 15 MG  tablet Commonly known as: REMERON Take 0.5-1 tablets (7.5-15 mg total) by mouth at bedtime as needed.   ondansetron 4 MG disintegrating tablet Commonly known as: ZOFRAN-ODT TAKE 1 TABLET BY MOUTH EVERY 8 HOURS AS NEEDED FOR NAUSEA AND VOMITING   Oxycodone HCl 10 MG Tabs Take 1 tablet (10 mg total) by mouth every 6 (six) hours as needed ((score 7 to 10)).   pantoprazole 40 MG tablet Commonly known as: PROTONIX TAKE 1 TABLET (40 MG TOTAL) BY MOUTH DAILY. TAKE 30-60 MIN BEFORE FIRST MEAL OF THE DAY   primidone 250 MG tablet Commonly known as: MYSOLINE Take 250 mg by mouth 4 (four) times daily.   ProAir HFA 108 (90 Base) MCG/ACT inhaler Generic drug: albuterol INHALE 2 PUFFS INTO THE LUNGS EVERY 4 (FOUR) HOURS AS NEEDED FOR WHEEZING OR SHORTNESS OF BREATH.   riTUXimab 100 MG/10ML injection Commonly known as: RITUXAN Inject into the vein.   sucralfate 1 GM/10ML suspension Commonly known as: CARAFATE Take 10 mLs (1 g total) by mouth every 8 (eight) hours as needed.   tizanidine 2 MG capsule Commonly known as: ZANAFLEX Take 2 mg by mouth daily.   umeclidinium-vilanterol 62.5-25 MCG/INH Aepb Commonly known as: Anoro Ellipta Inhale 1 puff into the lungs daily.   valACYclovir 1000 MG tablet Commonly known as: VALTREX Take 1 tablet (1,000 mg total) by mouth daily. What changed: See the new instructions.  Discharge Assessment: Vitals:   12/20/18 0844 12/20/18 1519  BP:    Pulse:    Resp:    Temp:    SpO2: 98% 98%   Skin clean, dry and intact without evidence of skin break down, no evidence of skin tears noted. IV catheter discontinued intact. Site without signs and symptoms of complications - no redness or edema noted at insertion site, patient denies c/o pain - only slight tenderness at site.  Dressing with slight pressure applied.  D/c Instructions-Education: Discharge instructions given to patient/family with verbalized understanding. D/c education completed  with patient/family including follow up instructions, medication list, d/c activities limitations if indicated, with other d/c instructions as indicated by MD - patient able to verbalize understanding, all questions fully answered. Patient instructed to return to ED, call 911, or call MD for any changes in condition.  Patient escorted via [place], and D/C home via private auto.  Santa Lighter, RN 12/20/2018 5:30 PM  Nsg Discharge Note    Discharge Assessment: Vitals:   12/20/18 0844 12/20/18 1519  BP:    Pulse:    Resp:    Temp:    SpO2: 98% 98%   Skin clean, dry and intact without evidence of skin break down, no evidence of skin tears noted. IV catheter discontinued intact. Site without signs and symptoms of complications - no redness or edema noted at insertion site, patient denies c/o pain - only slight tenderness at site.  Dressing with slight pressure applied.  D/c Instructions-Education: Discharge instructions given to patient/family with verbalized understanding. D/c education completed with patient/family including follow up instructions, medication list, d/c activities limitations if indicated, with other d/c instructions as indicated by MD - patient able to verbalize understanding, all questions fully answered. Patient instructed to return to ED, call 911, or call MD for any changes in condition.  Patient escorted via [place], and D/C home via private auto.  Santa Lighter, RN 12/20/2018 5:30 PM

## 2018-12-20 NOTE — Evaluation (Signed)
Clinical/Bedside Swallow Evaluation Patient Details  Name: Angel Maddox MRN: [id number] Date of Birth: [birth year]  Today's Date: 12/20/2018 Time: SLP Start Time (ACUTE ONLY): 1157 SLP Stop Time (ACUTE ONLY): 1223 SLP Time Calculation (min) (ACUTE ONLY): 26 min  Past Medical History:  Past Medical History:  Diagnosis Date  . Anxiety   . Arthritis    osteo  . Bipolar 1 disorder ([place])   . Cervicalgia   . Chronic kidney disease    kidney stone  . COPD (chronic obstructive pulmonary disease) ([place])    new diagnosis- now sees pulmonologist  . Depression   . Elevated liver enzymes    She says "normal now"  . GERD (gastroesophageal reflux disease)   . H/O hiatal hernia   . Headache   . Hip fracture ([place]) 2017   left  . History of kidney stones    4-5 yrs ago  . Hypertension   . Multiple sclerosis ([place])     Had for 15 years  . Multiple sclerosis ([place])    dx 1999  . Tremors of nervous system    Past Surgical History:  Past Surgical History:  Procedure Laterality Date  . ANTERIOR CERVICAL DECOMP/DISCECTOMY FUSION N/A 07/28/2017   Procedure: CERVICAL FOUR-FIVE, CERVICAL FIVE-SIX ANTERIOR CERVICAL DECOMPRESSION/DISCECTOMY FUSION;  Surgeon: Eustace Moore, MD;  Location: [facility];  Service: Neurosurgery;  Laterality: N/A;  . COLONOSCOPY  2008   Dr.Kaplan  . EYE SURGERY     [facility]  . HIP PINNING,CANNULATED Left 11/09/2015   Procedure: CANNULATED HIP PINNING;  Surgeon: Rod Can, MD;  Location: [facility];  Service: Orthopedics;  Laterality: Left;  . LIPOMA EXCISION    . TUBAL LIGATION     HPI:  Angel Maddox is a 63 y.o. female with medical history significant for COPD, multiple sclerosis, bipolar disorder, hypertension, who presented to the ED with multiple complaints ongoing over the past 2 to 3 days.  Complaints included chest discomfort, upper extremity weakness and tingling, difficulty swallowing, decreased urination, cough, difficulty breathing, sore  throat, nasal congestion, generalized weakness.  Patient traveled to [facility] and came back 3 days ago.  Reportedly symptoms started shortly after coming back. Moderately advanced cerebral white matter changes for age, nonspecific, but relatively stable from previous without evidence for significant progression. No evidence for active demyelination. No other acute intracranial abnormality identified. Small remote right thalamic lacunar infarct, chronic in appearance, but new relative to 2019.   Assessment / Plan / Recommendation Clinical Impression  Clinical swallow evaluation completed at bedside. Pt without gross facial asymmetry , however speech sounds mildly dysarthric likely related to MS. Pt reports some difficulty swallowing for the past 1-2 years characterized by need to masticate solids thoroughly before swallowing. Pt denies xerostomia, but does report benefit from liquid wash with solids. She is followed by GI in [place] Cellar, MD) with plans for possible EGD due to "stomach burning". Pt without over signs or symptoms of aspiration and ok to initiate regular textures and thin liquid with aspiration and reflux precautions (Pt to take small bites and alternate solids and liquids). Pt in agreement with plan of care. SLP will sign off at this time.   SLP Visit Diagnosis: Dysphagia, unspecified (R13.10)    Aspiration Risk  Mild aspiration risk    Diet Recommendation Regular;Thin liquid   Liquid Administration via: Cup;Straw Medication Administration: Whole meds with liquid Supervision: Patient able to self feed Compensations: Multiple dry swallows after each bite/sip;Follow solids with liquid  Postural Changes: Seated upright at 90 degrees;Remain upright for at least 30 minutes after po intake    Other  Recommendations Oral Care Recommendations: Oral care BID Other Recommendations: Clarify dietary restrictions   Follow up Recommendations None      Frequency and  Duration            Prognosis Prognosis for Safe Diet Advancement: Good      Swallow Study   General Date of Onset: 12/19/18 HPI: Angel Maddox is a 63 y.o. female with medical history significant for COPD, multiple sclerosis, bipolar disorder, hypertension, who presented to the ED with multiple complaints ongoing over the past 2 to 3 days.  Complaints included chest discomfort, upper extremity weakness and tingling, difficulty swallowing, decreased urination, cough, difficulty breathing, sore throat, nasal congestion, generalized weakness.  Patient traveled to [facility] and came back 3 days ago.  Reportedly symptoms started shortly after coming back. Moderately advanced cerebral white matter changes for age, nonspecific, but relatively stable from previous without evidence for significant progression. No evidence for active demyelination. No other acute intracranial abnormality identified. Small remote right thalamic lacunar infarct, chronic in appearance, but new relative to 2019. Type of Study: Bedside Swallow Evaluation Previous Swallow Assessment: None on record Diet Prior to this Study: (full liquids) Temperature Spikes Noted: No Respiratory Status: Room air History of Recent Intubation: No Behavior/Cognition: Alert;Cooperative;Pleasant mood Oral Cavity Assessment: Within Functional Limits Oral Care Completed by SLP: No Oral Cavity - Dentition: Adequate natural dentition Vision: Functional for self-feeding Self-Feeding Abilities: Able to feed self Patient Positioning: Upright in bed Baseline Vocal Quality: Normal Volitional Cough: Strong Volitional Swallow: Able to elicit    Oral/Motor/Sensory Function Overall Oral Motor/Sensory Function: Within functional limits   Ice Chips Ice chips: Within functional limits Presentation: Spoon   Thin Liquid Thin Liquid: Within functional limits Presentation: Cup;Self Fed;Straw    Nectar Thick Nectar Thick Liquid: Not tested   Honey  Thick Honey Thick Liquid: Not tested   Puree Puree: Within functional limits Presentation: Self Fed   Solid     Solid: Within functional limits Presentation: Self Fed(mildly prolonged oral prep)     Thank you,  Genene Churn, [place] 12/20/2018,12:23 PM

## 2018-12-21 ENCOUNTER — Telehealth: Payer: Self-pay | Admitting: Physician Assistant

## 2018-12-21 ENCOUNTER — Telehealth: Payer: Self-pay | Admitting: Gastroenterology

## 2018-12-21 LAB — HIV ANTIBODY (ROUTINE TESTING W REFLEX): HIV Screen 4th Generation wRfx: NONREACTIVE

## 2018-12-21 LAB — HEMOGLOBIN A1C
Hgb A1c MFr Bld: 5.2 % (ref 4.8–5.6)
Mean Plasma Glucose: 103 mg/dL

## 2018-12-21 NOTE — Telephone Encounter (Signed)
Patient has questions regarding medication [place] she was in the hospital for 2 days came home 06/25, one of the [facility] stated she should not be taking over 2 mg., of [place] please advise, the last time she went down to 2 mg., of [place] caused her to have a lot of mad problems

## 2018-12-21 NOTE — Telephone Encounter (Signed)
Patient called said that she was in the [place] they past 2 days and was wondering when she will have her EGD. I did not see no recall for her. Would like to speak to someone

## 2018-12-21 NOTE — Telephone Encounter (Signed)
Informed patient according to Dr. Doyne Keel last office note that she will be due in July 2020 for her EGD recall. Informed patient that we will contact her when it's time to schedule.

## 2018-12-23 ENCOUNTER — Encounter: Payer: Self-pay | Admitting: Internal Medicine

## 2018-12-23 NOTE — Assessment & Plan Note (Signed)
Emphasis on reflux precautions 

## 2018-12-23 NOTE — Assessment & Plan Note (Signed)
Probably components of asthma/ COPD and weakness from [place]- work with current meds. PFT when able

## 2018-12-23 NOTE — Assessment & Plan Note (Signed)
Impacting ability to get to this office. Doesn't drive. Continues working with Neuro

## 2018-12-24 LAB — CULTURE, BLOOD (ROUTINE X 2)
Culture: NO GROWTH
Culture: NO GROWTH
Special Requests: ADEQUATE
Special Requests: ADEQUATE

## 2018-12-24 NOTE — Telephone Encounter (Signed)
I have reviewed the hospital records.  Does she have a follow-up with soon?

## 2018-12-24 NOTE — Telephone Encounter (Signed)
Patient has a follow up appointment scheduled. 

## 2018-12-25 ENCOUNTER — Encounter: Payer: Self-pay | Admitting: Gastroenterology

## 2018-12-25 ENCOUNTER — Other Ambulatory Visit: Payer: Self-pay

## 2018-12-25 DIAGNOSIS — R110 Nausea: Secondary | ICD-10-CM

## 2018-12-25 DIAGNOSIS — R1310 Dysphagia, unspecified: Secondary | ICD-10-CM

## 2018-12-25 DIAGNOSIS — K219 Gastro-esophageal reflux disease without esophagitis: Secondary | ICD-10-CM

## 2018-12-25 NOTE — Telephone Encounter (Signed)
Called and scheduled pt for EGD on Thursday, 7-2 at 4:30pm. Her ECHO on 6-25 had EF of 60-65%. She is not on O2, BT and is not diabetic.  Reviewed instructions with pt over the phone thoroughly.  She expressed understanding.

## 2018-12-25 NOTE — Telephone Encounter (Signed)
Sounds good. The benefit outweighs the risk for her. You don't need to call her.  I'm writing this for documentation.

## 2018-12-26 ENCOUNTER — Other Ambulatory Visit (HOSPITAL_COMMUNITY): Payer: Self-pay

## 2018-12-26 ENCOUNTER — Telehealth: Payer: Self-pay | Admitting: Gastroenterology

## 2018-12-26 NOTE — Telephone Encounter (Signed)
Dr. Duanne Guess informed staff that we will proceed with procedure for 12/27/18. Call placed to patient and informed her that we will proceed with procedure.

## 2018-12-26 NOTE — Telephone Encounter (Signed)
Spoke with patient regarding screening questions.  Patient's grandson tested positive within the last 14 days.  She has been tested last week at [facility] and her Covid-19 testing was negative.   Spoke with patient regarding Covid-19 screening questions Covid-19 Screening Questions:  Do you now or have you had a fever in the last 14 days? No    Do you have any respiratory symptoms of shortness of breath or cough now or in the last 14 days? no  Do you have any family members or close contacts with diagnosed or suspected Covid-19 in the past 14 days? Yes Grandson  Have you been tested for Covid-19 and found to be positive? Yes, negative last week  Pt made aware of that care partner may wait in the car or come up to the lobby during the procedure but will need to provide their own mask.

## 2018-12-27 ENCOUNTER — Other Ambulatory Visit: Payer: Self-pay

## 2018-12-27 ENCOUNTER — Observation Stay (HOSPITAL_COMMUNITY)
Admission: EM | Admit: 2018-12-27 | Discharge: 2018-12-28 | Disposition: A | Discharge status: Home / Self Care | Payer: Medicare Other | Attending: Internal Medicine | Admitting: Internal Medicine

## 2018-12-27 ENCOUNTER — Encounter: Payer: Medicare Other | Admitting: Gastroenterology

## 2018-12-27 ENCOUNTER — Telehealth: Payer: Self-pay | Admitting: Gastroenterology

## 2018-12-27 ENCOUNTER — Encounter (HOSPITAL_COMMUNITY): Payer: Self-pay | Admitting: Emergency Medicine

## 2018-12-27 ENCOUNTER — Emergency Department (HOSPITAL_COMMUNITY): Payer: Medicare Other

## 2018-12-27 DIAGNOSIS — I10 Essential (primary) hypertension: Secondary | ICD-10-CM | POA: Diagnosis not present

## 2018-12-27 DIAGNOSIS — F319 Bipolar disorder, unspecified: Secondary | ICD-10-CM | POA: Diagnosis not present

## 2018-12-27 DIAGNOSIS — F3131 Bipolar disorder, current episode depressed, mild: Secondary | ICD-10-CM | POA: Diagnosis not present

## 2018-12-27 DIAGNOSIS — E785 Hyperlipidemia, unspecified: Secondary | ICD-10-CM | POA: Diagnosis not present

## 2018-12-27 DIAGNOSIS — G35 Multiple sclerosis: Secondary | ICD-10-CM | POA: Diagnosis not present

## 2018-12-27 DIAGNOSIS — J449 Chronic obstructive pulmonary disease, unspecified: Secondary | ICD-10-CM | POA: Insufficient documentation

## 2018-12-27 DIAGNOSIS — G35D Multiple sclerosis, unspecified: Secondary | ICD-10-CM | POA: Diagnosis present

## 2018-12-27 DIAGNOSIS — Z20828 Contact with and (suspected) exposure to other viral communicable diseases: Secondary | ICD-10-CM | POA: Diagnosis not present

## 2018-12-27 DIAGNOSIS — Z87891 Personal history of nicotine dependence: Secondary | ICD-10-CM | POA: Insufficient documentation

## 2018-12-27 DIAGNOSIS — Z79899 Other long term (current) drug therapy: Secondary | ICD-10-CM | POA: Diagnosis not present

## 2018-12-27 DIAGNOSIS — R569 Unspecified convulsions: Principal | ICD-10-CM

## 2018-12-27 DIAGNOSIS — F411 Generalized anxiety disorder: Secondary | ICD-10-CM | POA: Diagnosis present

## 2018-12-27 DIAGNOSIS — F332 Major depressive disorder, recurrent severe without psychotic features: Secondary | ICD-10-CM | POA: Diagnosis present

## 2018-12-27 LAB — CBC WITH DIFFERENTIAL/PLATELET
Abs Immature Granulocytes: 0.01 10*3/uL (ref 0.00–0.07)
Basophils Absolute: 0.1 10*3/uL (ref 0.0–0.1)
Basophils Relative: 1 %
Eosinophils Absolute: 0.3 10*3/uL (ref 0.0–0.5)
Eosinophils Relative: 6 %
HCT: 42.7 % (ref 36.0–46.0)
Hemoglobin: 13.7 g/dL (ref 12.0–15.0)
Immature Granulocytes: 0 %
Lymphocytes Relative: 27 %
Lymphs Abs: 1.2 10*3/uL (ref 0.7–4.0)
MCH: 32.2 pg (ref 26.0–34.0)
MCHC: 32.1 g/dL (ref 30.0–36.0)
MCV: 100.2 fL — ABNORMAL HIGH (ref 80.0–100.0)
Monocytes Absolute: 0.5 10*3/uL (ref 0.1–1.0)
Monocytes Relative: 10 %
Neutro Abs: 2.5 10*3/uL (ref 1.7–7.7)
Neutrophils Relative %: 56 %
Platelets: 311 10*3/uL (ref 150–400)
RBC: 4.26 MIL/uL (ref 3.87–5.11)
RDW: 13.2 % (ref 11.5–15.5)
WBC: 4.5 10*3/uL (ref 4.0–10.5)
nRBC: 0 % (ref 0.0–0.2)

## 2018-12-27 LAB — COMPREHENSIVE METABOLIC PANEL
ALT: 19 U/L (ref 0–44)
AST: 34 U/L (ref 15–41)
Albumin: 4.8 g/dL (ref 3.5–5.0)
Alkaline Phosphatase: 150 U/L — ABNORMAL HIGH (ref 38–126)
Anion gap: 13 (ref 5–15)
BUN: 9 mg/dL (ref 8–23)
CO2: 25 mmol/L (ref 22–32)
Calcium: 9.4 mg/dL (ref 8.9–10.3)
Chloride: 97 mmol/L — ABNORMAL LOW (ref 98–111)
Creatinine, Ser: 0.86 mg/dL (ref 0.44–1.00)
GFR calc Af Amer: >60 mL/min (ref >60)
GFR calc non Af Amer: >60 mL/min (ref >60)
Glucose, Bld: 93 mg/dL (ref 70–99)
Potassium: 5.2 mmol/L — ABNORMAL HIGH (ref 3.5–5.1)
Sodium: 135 mmol/L (ref 135–145)
Total Bilirubin: 0.8 mg/dL (ref 0.3–1.2)
Total Protein: 8.1 g/dL (ref 6.5–8.1)

## 2018-12-27 LAB — URINALYSIS, ROUTINE W REFLEX MICROSCOPIC
Bilirubin Urine: NEGATIVE
Glucose, UA: NEGATIVE mg/dL
Hgb urine dipstick: NEGATIVE
Ketones, ur: NEGATIVE mg/dL
Leukocytes,Ua: NEGATIVE
Nitrite: NEGATIVE
Protein, ur: NEGATIVE mg/dL
Specific Gravity, Urine: 1.006 (ref 1.005–1.030)
pH: 6 (ref 5.0–8.0)

## 2018-12-27 LAB — SARS CORONAVIRUS 2 BY RT PCR (HOSPITAL ORDER, PERFORMED IN ~~LOC~~ HOSPITAL LAB): SARS Coronavirus 2: NEGATIVE

## 2018-12-27 LAB — RAPID URINE DRUG SCREEN, HOSP PERFORMED
Amphetamines: NOT DETECTED
Barbiturates: POSITIVE — AB
Benzodiazepines: NOT DETECTED
Cocaine: NOT DETECTED
Opiates: NOT DETECTED
Tetrahydrocannabinol: NOT DETECTED

## 2018-12-27 MED ORDER — ATORVASTATIN CALCIUM 40 MG PO TABS
40.0000 mg | ORAL_TABLET | Freq: Every day | ORAL | Status: DC
  Administered 2018-12-28: 40 mg via ORAL
  Filled 2018-12-27: qty 1

## 2018-12-27 MED ORDER — TIZANIDINE HCL 2 MG PO TABS
2.0000 mg | ORAL_TABLET | Freq: Three times a day (TID) | ORAL | Status: DC | PRN
  Administered 2018-12-27: 2 mg via ORAL
  Filled 2018-12-27 (×3): qty 1

## 2018-12-27 MED ORDER — GABAPENTIN 400 MG PO CAPS
800.0000 mg | ORAL_CAPSULE | Freq: Three times a day (TID) | ORAL | Status: DC
  Administered 2018-12-27: 800 mg via ORAL
  Filled 2018-12-27 (×2): qty 2

## 2018-12-27 MED ORDER — ACETAMINOPHEN 650 MG RE SUPP: 650.0000 mg | Freq: Four times a day (QID) | RECTAL | Status: DC | PRN

## 2018-12-27 MED ORDER — VALACYCLOVIR HCL 500 MG PO TABS
1000.0000 mg | ORAL_TABLET | Freq: Every day | ORAL | Status: DC
  Administered 2018-12-28: 1000 mg via ORAL
  Filled 2018-12-27: qty 2

## 2018-12-27 MED ORDER — ACETAMINOPHEN 325 MG PO TABS
650.0000 mg | ORAL_TABLET | Freq: Four times a day (QID) | ORAL | Status: DC | PRN
  Administered 2018-12-27: 650 mg via ORAL
  Filled 2018-12-27: qty 2

## 2018-12-27 MED ORDER — ACETAMINOPHEN 325 MG PO TABS
650.0000 mg | ORAL_TABLET | Freq: Four times a day (QID) | ORAL | Status: DC | PRN
  Administered 2018-12-28 (×2): 650 mg via ORAL
  Filled 2018-12-27 (×3): qty 2

## 2018-12-27 MED ORDER — ENOXAPARIN SODIUM 40 MG/0.4ML ~~LOC~~ SOLN
40.0000 mg | SUBCUTANEOUS | Status: DC
  Administered 2018-12-27: 40 mg via SUBCUTANEOUS
  Filled 2018-12-27: qty 0.4

## 2018-12-27 MED ORDER — OXYCODONE HCL 5 MG PO TABS
10.0000 mg | ORAL_TABLET | Freq: Four times a day (QID) | ORAL | Status: DC | PRN
  Administered 2018-12-27 – 2018-12-28 (×3): 10 mg via ORAL
  Filled 2018-12-27 (×3): qty 2

## 2018-12-27 MED ORDER — GABAPENTIN 800 MG PO TABS
800.0000 mg | ORAL_TABLET | Freq: Three times a day (TID) | ORAL | Status: DC
  Filled 2018-12-27: qty 1

## 2018-12-27 MED ORDER — ONDANSETRON HCL 4 MG PO TABS
4.0000 mg | ORAL_TABLET | Freq: Four times a day (QID) | ORAL | Status: DC | PRN
  Administered 2018-12-27 – 2018-12-28 (×2): 4 mg via ORAL
  Filled 2018-12-27 (×2): qty 1

## 2018-12-27 MED ORDER — AMLODIPINE BESYLATE 5 MG PO TABS
5.0000 mg | ORAL_TABLET | Freq: Every day | ORAL | Status: DC
  Administered 2018-12-28: 5 mg via ORAL
  Filled 2018-12-27: qty 1

## 2018-12-27 MED ORDER — IPRATROPIUM-ALBUTEROL 0.5-2.5 (3) MG/3ML IN SOLN: 3.0000 mL | Freq: Four times a day (QID) | RESPIRATORY_TRACT | Status: DC | PRN

## 2018-12-27 MED ORDER — ONDANSETRON HCL 4 MG/2ML IJ SOLN: 4.0000 mg | Freq: Four times a day (QID) | INTRAMUSCULAR | Status: DC | PRN

## 2018-12-27 MED ORDER — SUCRALFATE 1 GM/10ML PO SUSP
1.0000 g | Freq: Once | ORAL | Status: AC
  Administered 2018-12-27: 1 g via ORAL
  Filled 2018-12-27: qty 10

## 2018-12-27 MED ORDER — SODIUM CHLORIDE 0.9 % IV SOLN: 250.0000 mL | INTRAVENOUS | Status: DC | PRN

## 2018-12-27 MED ORDER — SODIUM CHLORIDE 0.9 % IV BOLUS
1000.0000 mL | Freq: Once | INTRAVENOUS | Status: AC
  Administered 2018-12-27: 1000 mL via INTRAVENOUS

## 2018-12-27 MED ORDER — ASPIRIN EC 81 MG PO TBEC
81.0000 mg | DELAYED_RELEASE_TABLET | Freq: Every day | ORAL | Status: DC
  Administered 2018-12-28: 81 mg via ORAL
  Filled 2018-12-27 (×2): qty 1

## 2018-12-27 MED ORDER — SODIUM CHLORIDE 0.9% FLUSH
3.0000 mL | Freq: Two times a day (BID) | INTRAVENOUS | Status: DC
  Administered 2018-12-27 – 2018-12-28 (×2): 3 mL via INTRAVENOUS

## 2018-12-27 MED ORDER — PRIMIDONE 250 MG PO TABS
250.0000 mg | ORAL_TABLET | Freq: Three times a day (TID) | ORAL | Status: DC
  Administered 2018-12-27: 250 mg via ORAL
  Filled 2018-12-27 (×3): qty 1

## 2018-12-27 MED ORDER — SODIUM CHLORIDE 0.9% FLUSH: 3.0000 mL | INTRAVENOUS | Status: DC | PRN

## 2018-12-27 NOTE — H&P (Addendum)
History and Physical    Angel Maddox OZH:[id number] DOB: [birth year] DOA: 12/27/2018  PCP: Terald Sleeper, PA-C  Patient coming from: Home  Chief Complaint: Seizure-like activity   HPI: Angel Maddox is a 63 y.o. female with medical history significant of COPD, MS, bipolar disorder, HTN, who presents with episodes of seizure-like activities. She was recently discharged from [facility] after being hospitalized due to generalized weakness and malaise.  During the hospitalization, she was treated for hyponatremia with sodium level 124.  HCTZ was discontinued.  MRI brain completed for possible MS flare without findings of active demyelination.  She had a stroke work-up at that time.  She now returns with complaints of seizure-like activity since Sunday.  According to husband report, it appears that she had full body shaking and possibly short postictal state.  Patient states that on Sunday, she was watching her grandkids.  She felt off all day, not feeling like her normal self.  That evening, she felt very weak while walking with her walker.  She sat down on the seat of her walker and her husband noticed that patient started shaking.  Her entire body was shaking.  Patient states that this lasted about 5 minutes, and she could hear her husband telling her to "relax.".  Afterward, she did not feel back to her normal self, but lay down in bed and went to sleep.  The next day, she was feeling okay.  She had taken a nap, then walked to the kitchen.  Similar episode happened where she did not feel well.  She sat down on the couch and called her husband.  Apparently, she had another shaking episode.  She still did not seek care at that time as she was trying to stay out of the hospital.  Today, she came to the emergency department because of her daughter's insistence.  She denies any fevers, chills, chest pain, shortness of breath, worsening or new nausea, vomiting or diarrhea.  ED Course: Labs  obtained which revealed that her hyponatremia has now resolved.  CT head without acute finding.  COVID-19 pending.  EDP spoke with neurology who recommended transfer to [facility] for continuous EEG monitoring.  Review of Systems: As per HPI otherwise 10 point review of systems negative.   Past Medical History:  Diagnosis Date  . Anxiety   . Arthritis    osteo  . Bipolar 1 disorder ([place])   . Cervicalgia   . Chronic kidney disease    kidney stone  . COPD (chronic obstructive pulmonary disease) ([place])    new diagnosis- now sees pulmonologist  . Depression   . Elevated liver enzymes    She says "normal now"  . GERD (gastroesophageal reflux disease)   . H/O hiatal hernia   . Headache   . Hip fracture ([place]) 2017   left  . History of kidney stones    4-5 yrs ago  . Hypertension   . Multiple sclerosis ([place])     Had for 15 years  . Multiple sclerosis ([place])    dx 1999  . Tremors of nervous system     Past Surgical History:  Procedure Laterality Date  . ANTERIOR CERVICAL DECOMP/DISCECTOMY FUSION N/A 07/28/2017   Procedure: CERVICAL FOUR-FIVE, CERVICAL FIVE-SIX ANTERIOR CERVICAL DECOMPRESSION/DISCECTOMY FUSION;  Surgeon: Eustace Moore, MD;  Location: [facility];  Service: Neurosurgery;  Laterality: N/A;  . COLONOSCOPY  2008   Dr.Kaplan  . EYE SURGERY     [facility]  .  HIP PINNING,CANNULATED Left 11/09/2015   Procedure: CANNULATED HIP PINNING;  Surgeon: Rod Can, MD;  Location: [facility];  Service: Orthopedics;  Laterality: Left;  . LIPOMA EXCISION    . TUBAL LIGATION       reports that she quit smoking about 27 years ago. Her smoking use included cigarettes. She has a 3.75 pack-year smoking history. She has never used smokeless tobacco. She reports that she does not drink alcohol or use drugs.  Allergies  Allergen Reactions  . Hydrocodone Itching    Family History  Problem Relation Age of Onset  . Heart attack Mother   . Cancer Maternal Aunt   .  Colon cancer Neg Hx   . Stomach cancer Neg Hx     Prior to Admission medications   Medication Sig Start Date End Date Taking? Authorizing Provider  amLODipine (NORVASC) 5 MG tablet Take 1 tablet (5 mg total) by mouth daily. 12/21/18   Johnson, Clanford L, MD  aspirin EC 81 MG EC tablet Take 1 tablet (81 mg total) by mouth daily. 12/21/18   Johnson, Clanford L, MD  atorvastatin (LIPITOR) 40 MG tablet Take 1 tablet (40 mg total) by mouth daily at 6 PM. 12/20/18   Johnson, Clanford L, MD  benzonatate (TESSALON) 200 MG capsule Take 1 capsule (200 mg total) by mouth 2 (two) times daily as needed for cough. 12/18/18   Janora Norlander, DO  buPROPion (WELLBUTRIN SR) 200 MG 12 hr tablet Take 1 tablet (200 mg total) by mouth 2 (two) times daily. 11/05/18   Donnal Moat T, PA-C  citalopram (CELEXA) 20 MG tablet Take 1 tablet (20 mg total) by mouth daily. 12/20/18   Johnson, Clanford L, MD  clonazePAM (KLONOPIN) 0.5 MG tablet Take 1 tablet (0.5 mg total) by mouth at bedtime as needed for anxiety. 11/05/18   Donnal Moat T, PA-C  famotidine (PEPCID) 20 MG tablet Take 20 mg by mouth at bedtime.    [provider]  gabapentin (NEURONTIN) 800 MG tablet Take 400 mg by mouth 3 (three) times daily. Takes half tablet    [provider]  ipratropium-albuterol (DUONEB) 0.5-2.5 (3) MG/3ML SOLN 1 neb every 6 hours as needed for help breathing 10/18/18   Baird Lyons D, MD  mirtazapine (REMERON) 15 MG tablet Take 0.5-1 tablets (7.5-15 mg total) by mouth at bedtime as needed. 11/05/18   Addison Lank, PA-C  Nebulizers (COMPRESSOR/NEBULIZER) MISC Use as directed 10/18/18   Deneise Lever, MD  ondansetron (ZOFRAN-ODT) 4 MG disintegrating tablet TAKE 1 TABLET BY MOUTH EVERY 8 HOURS AS NEEDED FOR NAUSEA AND VOMITING Patient taking differently: Take 4 mg by mouth every 8 (eight) hours as needed for nausea or vomiting.  12/05/18   Armbruster, Carlota Raspberry, MD  Oxycodone HCl 10 MG TABS Take 1 tablet (10 mg total)  by mouth every 6 (six) hours as needed ((score 7 to 10)). 07/10/18   Terald Sleeper, PA-C  pantoprazole (PROTONIX) 40 MG tablet TAKE 1 TABLET (40 MG TOTAL) BY MOUTH DAILY. TAKE 30-60 MIN BEFORE FIRST MEAL OF THE DAY Patient taking differently: Take 40 mg by mouth See admin instructions. Take once a day, 30-60 min before first meal 09/11/18   Tanda Rockers, MD  primidone (MYSOLINE) 250 MG tablet Take 250 mg by mouth 4 (four) times daily.    [provider]  PROAIR HFA 108 (90 Base) MCG/ACT inhaler INHALE 2 PUFFS INTO THE LUNGS EVERY 4 (FOUR) HOURS AS NEEDED FOR WHEEZING OR  SHORTNESS OF BREATH. Patient taking differently: Inhale 2 puffs into the lungs every 4 (four) hours as needed for wheezing or shortness of breath.  11/26/18   Terald Sleeper, PA-C  riTUXimab (RITUXAN) 100 MG/10ML injection Inject into the vein.    [provider]  sucralfate (CARAFATE) 1 GM/10ML suspension Take 10 mLs (1 g total) by mouth every 8 (eight) hours as needed. 10/25/18   Armbruster, Carlota Raspberry, MD  tizanidine (ZANAFLEX) 2 MG capsule Take 2 mg by mouth daily.     [provider]  umeclidinium-vilanterol (ANORO ELLIPTA) 62.5-25 MCG/INH AEPB Inhale 1 puff into the lungs daily. 09/27/18   Tanda Rockers, MD  valACYclovir (VALTREX) 1000 MG tablet Take 1 tablet (1,000 mg total) by mouth daily. 12/20/18   Murlean Iba, MD    Physical Exam: Vitals:   12/27/18 1312 12/27/18 1500 12/27/18 1606 12/27/18 1652  BP: (!) 190/112 (!) 182/99 (!) 171/101 (!) 174/106  Pulse: 69 70 74 60  Resp: 18 18 19 15   Temp: 98.5 F (36.9 C)     TempSrc: Oral     SpO2: 98% 98% 98% 99%     Constitutional: NAD, calm, comfortable Eyes: PERRL, lids and conjunctivae normal ENMT: Deferred, patient wearing a facemask Respiratory: clear to auscultation bilaterally, no wheezing, no crackles. Normal respiratory effort. No accessory muscle use.  Cardiovascular: Regular rate and rhythm, no murmurs / rubs / gallops. No  extremity edema.  Abdomen: no tenderness, no masses palpated. No hepatosplenomegaly. Bowel sounds positive.  Musculoskeletal: no clubbing / cyanosis. No joint deformity upper and lower extremities. Normal muscle tone.  Skin: no rashes, lesions, ulcers. No induration Neurologic: CN 2-12 grossly intact.  Lower extremities weak bilaterally and equally.  No pronator drift Psychiatric: Normal judgment and insight. Alert and oriented x 3. Normal mood.   Labs on Admission: I have personally reviewed following labs and imaging studies  CBC: Recent Labs  Lab 12/27/18 1456  WBC 4.5  NEUTROABS 2.5  HGB 13.7  HCT 42.7  MCV 100.2*  PLT 160   Basic Metabolic Panel: Recent Labs  Lab 12/27/18 1456  NA 135  K 5.2*  CL 97*  CO2 25  GLUCOSE 93  BUN 9  CREATININE 0.86  CALCIUM 9.4   GFR: Estimated Creatinine Clearance: 66 mL/min (by C-G formula based on SCr of 0.86 mg/dL). Liver Function Tests: Recent Labs  Lab 12/27/18 1456  AST 34  ALT 19  ALKPHOS 150*  BILITOT 0.8  PROT 8.1  ALBUMIN 4.8   No results for input(s): LIPASE, AMYLASE in the last 168 hours. No results for input(s): AMMONIA in the last 168 hours. Coagulation Profile: No results for input(s): INR, PROTIME in the last 168 hours. Cardiac Enzymes: No results for input(s): CKTOTAL, CKMB, CKMBINDEX, TROPONINI in the last 168 hours. BNP (last 3 results) Recent Labs    07/03/18 1021 09/17/18 1446  PROBNP 97.0 75.0   HbA1C: No results for input(s): HGBA1C in the last 72 hours. CBG: No results for input(s): GLUCAP in the last 168 hours. Lipid Profile: No results for input(s): CHOL, HDL, LDLCALC, TRIG, CHOLHDL, LDLDIRECT in the last 72 hours. Thyroid Function Tests: No results for input(s): TSH, T4TOTAL, FREET4, T3FREE, THYROIDAB in the last 72 hours. Anemia Panel: No results for input(s): VITAMINB12, FOLATE, FERRITIN, TIBC, IRON, RETICCTPCT in the last 72 hours. Urine analysis:    Component Value Date/Time    COLORURINE STRAW (A) 12/27/2018 1650   APPEARANCEUR CLEAR 12/27/2018 1650   LABSPEC 1.006  12/27/2018 1650   PHURINE 6.0 12/27/2018 [place] 12/27/2018 1650   HGBUR NEGATIVE 12/27/2018 1650   [place] 12/27/2018 1650   KETONESUR NEGATIVE 12/27/2018 1650   PROTEINUR NEGATIVE 12/27/2018 1650   NITRITE NEGATIVE 12/27/2018 1650   LEUKOCYTESUR NEGATIVE 12/27/2018 1650   Sepsis Labs: !!!!!!!!!!!!!!!!!!!!!!!!!!!!!!!!!!!!!!!!!!!! @LABRCNTIP (procalcitonin:4,lacticidven:4) ) Recent Results (from the past 240 hour(s))  Urine culture     Status: None   Collection Time: 12/19/18 12:46 PM   Specimen: Urine, Clean Catch  Result Value Ref Range Status   Specimen Description   Final    URINE, CLEAN CATCH Performed at [facility], [address], [place], [place] [address]    Special Requests   Final    NONE Performed at [facility], [address], [place], [place] [address]    Culture   Final    NO GROWTH Performed at [facility], [place] [address] [place], [place] [address]    Report Status 12/20/2018 FINAL  Final  SARS Coronavirus 2 (CEPHEID- Performed in [facility]), Hosp Order     Status: None   Collection Time: 12/19/18  1:57 PM   Specimen: Nasopharyngeal Swab  Result Value Ref Range Status   SARS Coronavirus 2 NEGATIVE NEGATIVE Final    Comment: (NOTE) If result is NEGATIVE SARS-CoV-2 target nucleic acids are NOT DETECTED. The SARS-CoV-2 RNA is generally detectable in upper and lower  respiratory specimens during the acute phase of infection. The lowest  concentration of SARS-CoV-2 viral copies this assay can detect is 250  copies / mL. A negative result does not preclude SARS-CoV-2 infection  and should not be used as the sole basis for treatment or other  patient management decisions.  A negative result may occur with  improper specimen collection / handling, submission of specimen other  than nasopharyngeal swab, presence of viral  mutation(s) within the  areas targeted by this assay, and inadequate number of viral copies  (<250 copies / mL). A negative result must be combined with clinical  observations, patient history, and epidemiological information. If result is POSITIVE SARS-CoV-2 target nucleic acids are DETECTED. The SARS-CoV-2 RNA is generally detectable in upper and lower  respiratory specimens dur ing the acute phase of infection.  Positive  results are indicative of active infection with SARS-CoV-2.  Clinical  correlation with patient history and other diagnostic information is  necessary to determine patient infection status.  Positive results do  not rule out bacterial infection or co-infection with other viruses. If result is PRESUMPTIVE POSTIVE SARS-CoV-2 nucleic acids MAY BE PRESENT.   A presumptive positive result was obtained on the submitted specimen  and confirmed on repeat testing.  While 2019 novel coronavirus  (SARS-CoV-2) nucleic acids may be present in the submitted sample  additional confirmatory testing may be necessary for epidemiological  and / or clinical management purposes  to differentiate between  SARS-CoV-2 and other Sarbecovirus currently known to infect humans.  If clinically indicated additional testing with an alternate test  methodology [phone]) is advised. The SARS-CoV-2 RNA is generally  detectable in upper and lower respiratory sp ecimens during the acute  phase of infection. The expected result is Negative. Fact Sheet for Patients:  StrictlyIdeas.no Fact Sheet for Healthcare Providers: BankingDealers.co.za This test is not yet approved or cleared by the [place] FDA and has been authorized for detection and/or diagnosis of SARS-CoV-2 by FDA under an Emergency Use Authorization (EUA).  This EUA will remain in effect (meaning  this test can be used) for the duration of the COVID-19 declaration under Section 564(b)(1)  of the Act, 21 U.S.C. section 360bbb-3(b)(1), unless the authorization is terminated or revoked sooner. Performed at [facility], [address] [place], [place] [address]   Culture, blood (routine x 2)     Status: None   Collection Time: 12/19/18  1:57 PM   Specimen: BLOOD  Result Value Ref Range Status   Specimen Description BLOOD SITE NOT SPECIFIED  Final   Special Requests   Final    BOTTLES DRAWN AEROBIC AND ANAEROBIC Blood Culture adequate volume   Culture   Final    NO GROWTH 5 DAYS Performed at [facility], [address] [place], [place] [address]    Report Status 12/24/2018 FINAL  Final  Culture, blood (routine x 2)     Status: None   Collection Time: 12/19/18  1:57 PM   Specimen: BLOOD RIGHT ARM  Result Value Ref Range Status   Specimen Description BLOOD RIGHT ARM  Final   Special Requests   Final    BOTTLES DRAWN AEROBIC AND ANAEROBIC Blood Culture adequate volume   Culture   Final    NO GROWTH 5 DAYS Performed at [facility], [address] [place], [place] [address]    Report Status 12/24/2018 FINAL  Final  Respiratory Panel by PCR     Status: None   Collection Time: 12/19/18  5:57 PM   Specimen: Nasopharyngeal Swab; Respiratory  Result Value Ref Range Status   Adenovirus NOT DETECTED NOT DETECTED Final   Coronavirus 229E NOT DETECTED NOT DETECTED Final    Comment: (NOTE) The Coronavirus on the Respiratory Panel, DOES NOT test for the novel  Coronavirus (2019 nCoV)    Coronavirus HKU1 NOT DETECTED NOT DETECTED Final   Coronavirus NL63 NOT DETECTED NOT DETECTED Final   Coronavirus OC43 NOT DETECTED NOT DETECTED Final   Metapneumovirus NOT DETECTED NOT DETECTED Final   Rhinovirus / Enterovirus NOT DETECTED NOT DETECTED Final   Influenza A NOT DETECTED NOT DETECTED Final   Influenza B NOT DETECTED NOT DETECTED Final   Parainfluenza Virus 1 NOT DETECTED NOT DETECTED Final   Parainfluenza Virus 2 NOT DETECTED NOT DETECTED Final   Parainfluenza Virus 3 NOT  DETECTED NOT DETECTED Final   Parainfluenza Virus 4 NOT DETECTED NOT DETECTED Final   Respiratory Syncytial Virus NOT DETECTED NOT DETECTED Final   Bordetella pertussis NOT DETECTED NOT DETECTED Final   Chlamydophila pneumoniae NOT DETECTED NOT DETECTED Final   Mycoplasma pneumoniae NOT DETECTED NOT DETECTED Final    Comment: Performed at [facility], [place] [address], [place], [place] [address]     Radiological Exams on Admission: Ct Head Wo Contrast  Result Date: 12/27/2018 CLINICAL DATA:  Altered level of consciousness which is unexplained. Recent seizures. EXAM: CT HEAD WITHOUT CONTRAST TECHNIQUE: Contiguous axial images were obtained from the base of the skull through the vertex without intravenous contrast. COMPARISON:  MRI 12/19/2018 FINDINGS: Brain: No sign of acute infarction. There are mild chronic small-vessel ischemic changes of the hemispheric white matter. There is an old small vessel infarction of the right thalamus. Dilated perivascular spaces are present at the base of the brain. No sign of mass lesion, hemorrhage, hydrocephalus or extra-axial collection. Vascular: Normal Skull: Normal Sinuses/Orbits: Clear/normal Other: None IMPRESSION: No acute finding. No specific cause of recent seizures. Mild chronic small-vessel change of the white matter. 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 Electronically Signed  By: Nelson Chimes M.D.   On: 12/27/2018 15:24    EKG: Independently reviewed.  Normal sinus rhythm  Assessment/Plan Principal Problem:   Seizure-like activity (HCC) Active Problems:   Bipolar 1 disorder, depressed (HCC)   MS (multiple sclerosis) ([place])   MDD (major depressive disorder), recurrent episode, severe ([place])   GAD (generalized anxiety disorder)   Dyslipidemia   Seizure-like activity -Neurology consulted by EDP.  Plan for continuous EEG monitoring at [place] precaution  MS -Follows with Neurologist Dr. Delphia Grates in [place] -Continue primidone  Chronic COPD -Duo nebs  Previous history of CVA, right thalamic lacunar infarct -Continue aspirin, Lipitor  Bipolar, depression and anxiety -Stop Wellbutrin, Remeron, Celexa, clonazepam  Essential hypertension -Continue amlodipine   DVT prophylaxis: Lovenox  Code Status: Full  Family Communication: None Disposition Plan: Pending improvement and work up  [employer] called: Neurology  Admission status: Inpatient   * I certify that at the point of admission it is my clinical judgment that the patient will require inpatient hospital care spanning beyond 2 midnights from the point of admission due to high intensity of service, high risk for further deterioration and high frequency of surveillance required.Dessa Phi, DO [facility] 12/27/2018, 6:18 PM    How to contact the [facility] Attending or Consulting provider [place] or covering provider during after hours [place], for this patient?  1. Check the care team in [facility] and look for a) attending/consulting [facility] provider listed and b) the [facility] team listed 2. Log into [email or web address] and use [place]'s universal password to access. If you do not have the password, please contact the hospital operator. 3. Locate the [facility] provider you are looking for under [facility] and page to a number that you can be directly reached. 4. If you still have difficulty reaching the provider, please page the [facility] (Director on Call) for the Hospitalists listed on amion for assistance.

## 2018-12-27 NOTE — ED Notes (Signed)
Patient transported to CT 

## 2018-12-27 NOTE — ED Notes (Signed)
ED Provider at bedside. 

## 2018-12-27 NOTE — ED Provider Notes (Signed)
[facility] DEPT Provider Note   CSN: [id number] Arrival date & time: 12/27/18  1305    History   Chief Complaint Chief Complaint  Patient presents with  . Seizures    HPI Angel Maddox is a 63 y.o. female.     The history is provided by the patient.  Seizures Seizure activity on arrival: no   Seizure type:  Unable to specify Initial focality:  Unable to specify Postictal symptoms: confusion   Return to baseline: yes   Severity:  Mild Timing:  Clustered Progression:  Resolved History of seizures: yes (Recent MRI with old stroke, awaiting EEG next week with neuro, not on medications. )     Past Medical History:  Diagnosis Date  . Anxiety   . Arthritis    osteo  . Bipolar 1 disorder ([place])   . Cervicalgia   . Chronic kidney disease    kidney stone  . COPD (chronic obstructive pulmonary disease) ([place])    new diagnosis- now sees pulmonologist  . Depression   . Elevated liver enzymes    She says "normal now"  . GERD (gastroesophageal reflux disease)   . H/O hiatal hernia   . Headache   . Hip fracture ([place]) 2017   left  . History of kidney stones    4-5 yrs ago  . Hypertension   . Multiple sclerosis ([place])     Had for 15 years  . Multiple sclerosis ([place])    dx 1999  . Tremors of nervous system     Patient Active Problem List   Diagnosis Date Noted  . Dyslipidemia 12/20/2018  . Hyponatremia 07/04/2018  . DOE (dyspnea on exertion) 07/03/2018  . COPD (chronic obstructive pulmonary disease) ([place]) 07/03/2018  . MDD (major depressive disorder) 06/28/2018  . GAD (generalized anxiety disorder) 06/28/2018  . Major depressive disorder, recurrent episode, moderate ([place]) 04/17/2018  . Head injury 12/08/2017  . Cervical vertebral fusion 07/28/2017  . DDD (degenerative disc disease), cervical 07/10/2017  . DDD (degenerative disc disease), thoracic 07/10/2017  . DDD (degenerative disc disease), lumbar 07/10/2017  . Facet arthritis  of cervical region 07/10/2017  . Abnormal liver function tests 03/07/2017  . Hypokalemia 03/07/2017  . Gastroesophageal reflux disease with esophagitis 03/07/2017  . Esophageal dysphagia 03/07/2017  . Chronic idiopathic constipation 02/01/2017  . Fatigue 02/01/2017  . HSV-1 infection 01/12/2017  . Osteoporosis with pathological fracture, with routine healing, subsequent encounter 08/30/2016  . MDD (major depressive disorder), recurrent episode, severe ([place]) 03/29/2016  . Estrogen deficiency 12/15/2015  . MS (multiple sclerosis) ([place]) 12/15/2015  . Tremor 12/15/2015  . Healthcare maintenance 12/15/2015  . Protein-calorie malnutrition, severe 11/09/2015  . Bipolar 1 disorder, depressed ([place]) 02/11/2015    Past Surgical History:  Procedure Laterality Date  . ANTERIOR CERVICAL DECOMP/DISCECTOMY FUSION N/A 07/28/2017   Procedure: CERVICAL FOUR-FIVE, CERVICAL FIVE-SIX ANTERIOR CERVICAL DECOMPRESSION/DISCECTOMY FUSION;  Surgeon: Eustace Moore, MD;  Location: [facility];  Service: Neurosurgery;  Laterality: N/A;  . COLONOSCOPY  2008   Dr.Kaplan  . EYE SURGERY     [facility]  . HIP PINNING,CANNULATED Left 11/09/2015   Procedure: CANNULATED HIP PINNING;  Surgeon: Rod Can, MD;  Location: [facility];  Service: Orthopedics;  Laterality: Left;  . LIPOMA EXCISION    . TUBAL LIGATION       OB History   No obstetric history on file.      Home Medications    Prior to Admission medications   Medication Sig Start  Date End Date Taking? Authorizing Provider  amLODipine (NORVASC) 5 MG tablet Take 1 tablet (5 mg total) by mouth daily. 12/21/18   Johnson, Clanford L, MD  aspirin EC 81 MG EC tablet Take 1 tablet (81 mg total) by mouth daily. 12/21/18   Johnson, Clanford L, MD  atorvastatin (LIPITOR) 40 MG tablet Take 1 tablet (40 mg total) by mouth daily at 6 PM. 12/20/18   Johnson, Clanford L, MD  benzonatate (TESSALON) 200 MG capsule Take 1 capsule (200 mg total) by mouth 2 (two) times  daily as needed for cough. 12/18/18   Janora Norlander, DO  buPROPion (WELLBUTRIN SR) 200 MG 12 hr tablet Take 1 tablet (200 mg total) by mouth 2 (two) times daily. 11/05/18   Donnal Moat T, PA-C  citalopram (CELEXA) 20 MG tablet Take 1 tablet (20 mg total) by mouth daily. 12/20/18   Johnson, Clanford L, MD  clonazePAM (KLONOPIN) 0.5 MG tablet Take 1 tablet (0.5 mg total) by mouth at bedtime as needed for anxiety. 11/05/18   Donnal Moat T, PA-C  famotidine (PEPCID) 20 MG tablet Take 20 mg by mouth at bedtime.    [provider]  gabapentin (NEURONTIN) 800 MG tablet Take 400 mg by mouth 3 (three) times daily. Takes half tablet    [provider]  ipratropium-albuterol (DUONEB) 0.5-2.5 (3) MG/3ML SOLN 1 neb every 6 hours as needed for help breathing 10/18/18   Baird Lyons D, MD  mirtazapine (REMERON) 15 MG tablet Take 0.5-1 tablets (7.5-15 mg total) by mouth at bedtime as needed. 11/05/18   Addison Lank, PA-C  Nebulizers (COMPRESSOR/NEBULIZER) MISC Use as directed 10/18/18   Deneise Lever, MD  ondansetron (ZOFRAN-ODT) 4 MG disintegrating tablet TAKE 1 TABLET BY MOUTH EVERY 8 HOURS AS NEEDED FOR NAUSEA AND VOMITING Patient taking differently: Take 4 mg by mouth every 8 (eight) hours as needed for nausea or vomiting.  12/05/18   Armbruster, Carlota Raspberry, MD  Oxycodone HCl 10 MG TABS Take 1 tablet (10 mg total) by mouth every 6 (six) hours as needed ((score 7 to 10)). 07/10/18   Terald Sleeper, PA-C  pantoprazole (PROTONIX) 40 MG tablet TAKE 1 TABLET (40 MG TOTAL) BY MOUTH DAILY. TAKE 30-60 MIN BEFORE FIRST MEAL OF THE DAY Patient taking differently: Take 40 mg by mouth See admin instructions. Take once a day, 30-60 min before first meal 09/11/18   Tanda Rockers, MD  primidone (MYSOLINE) 250 MG tablet Take 250 mg by mouth 4 (four) times daily.    [provider]  PROAIR HFA 108 (90 Base) MCG/ACT inhaler INHALE 2 PUFFS INTO THE LUNGS EVERY 4 (FOUR) HOURS AS NEEDED FOR WHEEZING  OR SHORTNESS OF BREATH. Patient taking differently: Inhale 2 puffs into the lungs every 4 (four) hours as needed for wheezing or shortness of breath.  11/26/18   Terald Sleeper, PA-C  riTUXimab (RITUXAN) 100 MG/10ML injection Inject into the vein.    [provider]  sucralfate (CARAFATE) 1 GM/10ML suspension Take 10 mLs (1 g total) by mouth every 8 (eight) hours as needed. 10/25/18   Armbruster, Carlota Raspberry, MD  tizanidine (ZANAFLEX) 2 MG capsule Take 2 mg by mouth daily.     [provider]  umeclidinium-vilanterol (ANORO ELLIPTA) 62.5-25 MCG/INH AEPB Inhale 1 puff into the lungs daily. 09/27/18   Tanda Rockers, MD  valACYclovir (VALTREX) 1000 MG tablet Take 1 tablet (1,000 mg total) by mouth daily. 12/20/18   Murlean Iba, MD  Family History Family History  Problem Relation Age of Onset  . Heart attack Mother   . Cancer Maternal Aunt   . Colon cancer Neg Hx   . Stomach cancer Neg Hx     Social History Social History   Tobacco Use  . Smoking status: Former Smoker    Packs/day: 0.25    Years: 15.00    Pack years: 3.75    Types: Cigarettes    Quit date: 01/25/1991    Years since quitting: 27.9  . Smokeless tobacco: Never Used  Substance Use Topics  . Alcohol use: No  . Drug use: No     Allergies   Hydrocodone   Review of Systems Review of Systems  Constitutional: Negative for chills and fever.  HENT: Negative for ear pain and sore throat.   Eyes: Negative for pain and visual disturbance.  Respiratory: Negative for cough and shortness of breath.   Cardiovascular: Negative for chest pain and palpitations.  Gastrointestinal: Negative for abdominal pain and vomiting.  Genitourinary: Negative for dysuria and hematuria.  Musculoskeletal: Negative for arthralgias and back pain.  Skin: Negative for color change and rash.  Neurological: Positive for seizures and weakness (general ). Negative for dizziness, tremors, syncope, facial asymmetry, speech  difficulty, light-headedness, numbness and headaches.  All other systems reviewed and are negative.    Physical Exam Updated Vital Signs  ED Triage Vitals  Enc Vitals Group     BP 12/27/18 1312 (!) 190/112     Pulse Rate 12/27/18 1312 69     Resp 12/27/18 1312 18     Temp 12/27/18 1312 98.5 F (36.9 C)     Temp Source 12/27/18 1312 Oral     SpO2 12/27/18 1312 98 %     Weight --      Height --      Head Circumference --      Peak Flow --      Pain Score 12/27/18 1311 6     Pain Loc --      Pain Edu? --      Excl. in [place]? --     Physical Exam Vitals signs and nursing note reviewed.  Constitutional:      General: She is not in acute distress.    Appearance: She is well-developed. She is not ill-appearing.  HENT:     Head: Normocephalic and atraumatic.     Nose: Nose normal.     Mouth/Throat:     Mouth: Mucous membranes are moist.  Eyes:     Extraocular Movements: Extraocular movements intact.     Conjunctiva/sclera: Conjunctivae normal.     Pupils: Pupils are equal, round, and reactive to light.  Neck:     Musculoskeletal: Neck supple.  Cardiovascular:     Rate and Rhythm: Normal rate and regular rhythm.     Heart sounds: No murmur.  Pulmonary:     Effort: Pulmonary effort is normal. No respiratory distress.     Breath sounds: Normal breath sounds.  Abdominal:     Palpations: Abdomen is soft.     Tenderness: There is no abdominal tenderness.  Skin:    General: Skin is warm and dry.  Neurological:     General: No focal deficit present.     Mental Status: She is alert and oriented to person, place, and time.     Cranial Nerves: No cranial nerve deficit.     Sensory: No sensory deficit.     Comments: Moves all extremities, poor  effort on strength examination throughout      ED Treatments / Results  Labs (all labs ordered are listed, but only abnormal results are displayed) Labs Reviewed  CBC WITH DIFFERENTIAL/PLATELET - Abnormal; Notable for the following  components:      Result Value   MCV 100.2 (*)    All other components within normal limits  COMPREHENSIVE METABOLIC PANEL - Abnormal; Notable for the following components:   Potassium 5.2 (*)    Chloride 97 (*)    Alkaline Phosphatase 150 (*)    All other components within normal limits  URINALYSIS, ROUTINE W REFLEX MICROSCOPIC - Abnormal; Notable for the following components:   Color, Urine STRAW (*)    All other components within normal limits  SARS CORONAVIRUS 2 (HOSPITAL ORDER, [facility])  RAPID URINE DRUG SCREEN, HOSP PERFORMED    EKG EKG Interpretation  Date/Time:  Thursday December 27 2018 15:09:57 EDT Ventricular Rate:  59 PR Interval:    QRS Duration: 97 QT Interval:  442 QTC Calculation: 438 R Axis:   103 Text Interpretation:  Junctional rhythm Probable lateral infarct, age indeterminate Confirmed by Lennice Sites [phone]) on 12/27/2018 3:13:43 PM   Radiology Ct Head Wo Contrast  Result Date: 12/27/2018 CLINICAL DATA:  Altered level of consciousness which is unexplained. Recent seizures. EXAM: CT HEAD WITHOUT CONTRAST TECHNIQUE: Contiguous axial images were obtained from the base of the skull through the vertex without intravenous contrast. COMPARISON:  MRI 12/19/2018 FINDINGS: Brain: No sign of acute infarction. There are mild chronic small-vessel ischemic changes of the hemispheric white matter. There is an old small vessel infarction of the right thalamus. Dilated perivascular spaces are present at the base of the brain. No sign of mass lesion, hemorrhage, hydrocephalus or extra-axial collection. Vascular: Normal Skull: Normal Sinuses/Orbits: Clear/normal Other: None IMPRESSION: No acute finding. No specific cause of recent seizures. Mild chronic small-vessel change of the white matter. 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 Electronically Signed   By: Nelson Chimes M.D.   On: 12/27/2018 15:24    Procedures Procedures (including  critical care time)  Medications Ordered in ED Medications  sodium chloride 0.9 % bolus 1,000 mL (0 mLs Intravenous Stopped 12/27/18 1721)     Initial Impression / Assessment and Plan / ED Course  I have reviewed the triage vital signs and the nursing notes.  Pertinent labs & imaging results that were available during my care of the patient were reviewed by me and considered in my medical decision making (see chart for details).     Angel Maddox is a 63 year old female with history of MS, anxiety, COPD who presents to the ED with seizure-like episode.  Patient had recent hospital admission where she had an unremarkable MRI, possible old stroke.  Has weakness throughout which appears possibly chronic.  Has had several seizure-like activity since Sunday.  Husband states that they all are sorta of different.  Appears to have full body shaking, possibly postictal state.  Patient has had issues with low sodium in the past.  Overall she appears neurologically intact.  She gives very poor effort with strength testing.  We will get basic labs, CT head.  Patient is supposed to see her neurologist next week for possible EEG, MS follow-up. EKG with no concerning features.   No significant electrolyte abnormality, kidney injury, leukocytosis.  Urinalysis unremarkable.  No fever, no signs to suggest meningitis.  Patient with multiple episodes of seizure-like activity.  Talked with  Dr. Cheral Marker with neurology and he recommends admission for continuous EEG at [facility].  Patient hemodynamically stable throughout my care.  No signs of seizure-like activity upon my care.  Patient had CT scan that showed no acute findings intracranially.  Suspect likely medication related versus primary seizure disorder versus possibly MS related.  This chart was dictated using voice recognition software.  Despite best efforts to proofread,  errors can occur which can change the documentation meaning.    Final Clinical  Impressions(s) / ED Diagnoses   Final diagnoses:  Seizure-like activity [facility])    ED Discharge Orders    None       Lennice Sites, DO 12/27/18 1729

## 2018-12-27 NOTE — Telephone Encounter (Signed)
Patient called said that she had a seizure yesterday and on Sunday and would like to know if its okay to still have her procedure today @ 4:30pm

## 2018-12-27 NOTE — ED Notes (Signed)
[facility] called. 

## 2018-12-27 NOTE — ED Triage Notes (Signed)
Pt reports she had 2 seizures last night. Pt reports that she does not take medications for seizures. Pt has neurologist in [place] has set up for EEG due to seizures since Sunday.

## 2018-12-27 NOTE — Consult Note (Signed)
Neurology Consultation  Reason for Consult: Evaluate for seizures, history of MS Referring Physician: Dr. Dessa Phi  CC: Altered mental status, evaluate for seizures, history of MS  History is obtained from: Patient, chart, patient's husband Mr. Waylon Koffler the phone  HPI: Angel Maddox is a 63 y.o. female past medical history of multiple sclerosis, failed multiple medications, currently on Rituxan infusions, essential tremors on primidone, neuropathy, COPD diagnosed because of shortness of breath that has worsened over the past 2 years, anxiety, bipolar 1 disorder, depression, brought into the emergency room for evaluation of abnormal spells concerning for seizures. According to the husband, she has had now at least 2 episodes where she has spells of depressed level of consciousness, her head bobbing front to back, making mumbling sounds that make no sense with her eyes rolled back and arms and legs flopping around.  She seems to appear to have lost all control of her body during these episodes but the husband was able to help her out onto a couch.  She did not fall or hurt herself during these episodes. She has a history of multiple sclerosis that was diagnosed 15 to 20 years ago and has been on multiple medications, the names that she cannot remember.  She sees a neurologist outside of the [facility], unfortunately I do not have access to all those records. She has been under a lot of stress lately and also in the months to years leading up to this admission with a loss of son, who died due to an overdose.  She does struggle with anxiety and depression but feels that her counselor has been working with her well to manage that.  The husband reports that she has had some spells of extreme anxiety, anger and depression in the past weeks to months. There was no history of any loss of bowel bladder with these episodes.  No tongue bites.  She has not had these episodes prior to these  couple of episodes in the past few days.  Because of the abnormal flopping around of the extremities and depressed level of consciousness, there was a concern for seizures, for which neurological consultation was obtained. Patient initially was very hesitant in speaking with me but after developing a rapport was able to provide any more history including that of her son's death due to overdose and how that impacts her daily with grief and sadness, and with the help of her counselor she manages it every day.  She was seen at [facility] couple weeks ago, had an MRI negative for any acute process.  Reports worsening shortness of breath over the past 2 years.  Reports some anxiety and depression but no suicidal or homicidal ideation.  Reports off-and-on headaches.  Reports tingling numbness weakness all over her body.  Denies any focality in her weakness at this time.  Denies any chest pain.  Denies palpitations.  Denies abdominal pain or diarrhea or constipation.  Reports urinary hesitancy and urgency and inability to completely void her bladder.  Review of her chart and care everywhere from many years ago reveals that she was diagnosed with MS essentially because of her clinical symptoms which included some visual symptoms and nonspecific paresthesias and weaknesses.  Spinal tap was unremarkable and visual evoked potentials were also unremarkable at that time.  ROS: Review of systems as documented in the HPI.  Other than that negative. Past Medical History:  Diagnosis Date  . Anxiety   . Arthritis  osteo  . Bipolar 1 disorder ([place])   . Cervicalgia   . Chronic kidney disease    kidney stone  . COPD (chronic obstructive pulmonary disease) ([place])    new diagnosis- now sees pulmonologist  . Depression   . Elevated liver enzymes    She says "normal now"  . GERD (gastroesophageal reflux disease)   . H/O hiatal hernia   . Headache   . Hip fracture ([place]) 2017   left  . History of kidney  stones    4-5 yrs ago  . Hypertension   . Multiple sclerosis ([place])     Had for 15 years  . Multiple sclerosis ([place])    dx 1999  . Tremors of nervous system    Family History  Problem Relation Age of Onset  . Heart attack Mother   . Cancer Maternal Aunt   . Colon cancer Neg Hx   . Stomach cancer Neg Hx    Social History:   reports that she quit smoking about 27 years ago. Her smoking use included cigarettes. She has a 3.75 pack-year smoking history. She has never used smokeless tobacco. She reports that she does not drink alcohol or use drugs.  Medications  Current Facility-Administered Medications:  .  0.9 %  sodium chloride infusion, 250 mL, Intravenous, PRN, Dessa Phi, DO .  acetaminophen (TYLENOL) tablet 650 mg, 650 mg, Oral, Q6H PRN **OR** acetaminophen (TYLENOL) suppository 650 mg, 650 mg, Rectal, Q6H PRN, Dessa Phi, DO .  [START ON 12/28/2018] amLODipine (NORVASC) tablet 5 mg, 5 mg, Oral, Daily, Dessa Phi, DO .  [START ON 12/28/2018] aspirin EC tablet 81 mg, 81 mg, Oral, Daily, Dessa Phi, DO .  [START ON 12/28/2018] atorvastatin (LIPITOR) tablet 40 mg, 40 mg, Oral, q1800, Dessa Phi, DO .  enoxaparin (LOVENOX) injection 40 mg, 40 mg, Subcutaneous, Q24H, Dessa Phi, DO .  gabapentin (NEURONTIN) capsule 800 mg, 800 mg, Oral, TID, Dessa Phi, DO .  ipratropium-albuterol (DUONEB) 0.5-2.5 (3) MG/3ML nebulizer solution 3 mL, 3 mL, Nebulization, Q6H PRN, Dessa Phi, DO .  ondansetron (ZOFRAN) tablet 4 mg, 4 mg, Oral, Q6H PRN **OR** ondansetron (ZOFRAN) injection 4 mg, 4 mg, Intravenous, Q6H PRN, Dessa Phi, DO .  oxyCODONE (Oxy IR/ROXICODONE) immediate release tablet 10 mg, 10 mg, Oral, Q6H PRN, Dessa Phi, DO .  primidone (MYSOLINE) tablet 250 mg, 250 mg, Oral, TID, Dessa Phi, DO .  sodium chloride flush (NS) 0.9 % injection 3 mL, 3 mL, Intravenous, Q12H, Choi, Jennifer, DO .  sodium chloride flush (NS) 0.9 % injection 3 mL, 3 mL,  Intravenous, PRN, Dessa Phi, DO .  sucralfate (CARAFATE) 1 GM/10ML suspension 1 g, 1 g, Oral, Once, Bodenheimer, Charles A, NP .  tiZANidine (ZANAFLEX) tablet 2 mg, 2 mg, Oral, Q8H PRN, Dessa Phi, DO .  [START ON 12/28/2018] valACYclovir (VALTREX) tablet 1,000 mg, 1,000 mg, Oral, Daily, Dessa Phi, DO  Current Outpatient Medications  Medication Instructions  . amLODipine (NORVASC) 5 mg, Oral, Daily  . aspirin 81 mg, Oral, Daily  . atorvastatin (LIPITOR) 40 mg, Oral, Daily-1800  . benzonatate (TESSALON) 200 mg, Oral, 2 times daily PRN  . buPROPion (WELLBUTRIN SR) 200 mg, Oral, 2 times daily  . citalopram (CELEXA) 20 mg, Oral, Daily  . clonazePAM (KLONOPIN) 0.5 mg, Oral, At bedtime PRN  . gabapentin (NEURONTIN) 800 mg, Oral, 3 times daily  . ipratropium-albuterol (DUONEB) 0.5-2.5 (3) MG/3ML SOLN 1 neb every 6 hours as needed for help breathing  . mirtazapine (REMERON) 7.5-15  mg, Oral, At bedtime PRN  . Nebulizers (COMPRESSOR/NEBULIZER) MISC Use as directed  . ondansetron (ZOFRAN-ODT) 4 MG disintegrating tablet TAKE 1 TABLET BY MOUTH EVERY 8 HOURS AS NEEDED FOR NAUSEA AND VOMITING  . Oxycodone HCl 10 mg, Oral, Every 6 hours PRN  . primidone (MYSOLINE) 250 mg, Oral, 3 times daily  . PROAIR HFA 108 (90 Base) MCG/ACT inhaler INHALE 2 PUFFS INTO THE LUNGS EVERY 4 (FOUR) HOURS AS NEEDED FOR WHEEZING OR SHORTNESS OF BREATH.  . pseudoephedrine-acetaminophen (TYLENOL SINUS) 30-500 MG TABS tablet 1 tablet, Oral, Daily PRN  . riTUXimab (RITUXAN) 100 MG/10ML injection Intravenous  . sucralfate (CARAFATE) 1 g, Oral, Every 8 hours PRN  . tizanidine (ZANAFLEX) 2 mg, Oral, Every 8 hours PRN  . valACYclovir (VALTREX) 1,000 mg, Oral, Daily    Exam: Current vital signs: BP (!) 169/97 (BP Location: Left Arm)   Pulse 64   Temp 98 F (36.7 C) (Oral)   Resp 18   SpO2 100%  Vital signs in last 24 hours: Temp:  [98 F (36.7 C)-98.5 F (36.9 C)] 98 F (36.7 C) (07/02 2152) Pulse Rate:   [60-74] 64 (07/02 2152) Resp:  [15-20] 18 (07/02 2152) BP: (162-190)/(97-112) 169/97 (07/02 2152) SpO2:  [98 %-100 %] 100 % (07/02 2152)  GENERAL: Awake, alert in NAD HEENT: - Normocephalic and atraumatic, dry mm, no LN++, no Thyromegally LUNGS - Clear to auscultation bilaterally with no wheezes CV - S1S2 RRR, no m/r/g, equal pulses bilaterally. ABDOMEN - Soft, nontender, nondistended with normoactive BS Ext: warm, well perfused, intact peripheral pulses, no edema  NEURO:  Mental Status: AA&Ox3 but avoidant kind of affect at the start, later cooperative. Language: speech is slightly hesitant and stuttering - not dysarthric.  Naming, repetition, fluency, and comprehension intact but is volitionally slow to respond to commands. Cranial Nerves: PERRL. EOMI, visual fields full, no facial asymmetry, facial sensation intact, hearing intact, tongue/uvula/soft palate midline, normal sternocleidomastoid and trapezius muscle strength. No evidence of tongue atrophy or fibrillations Motor: effort dependent weakness with intentional dropping of arms, on coaching 5/5 in both upper extremities, lower extremities - antigravity but drops down before 5 seconds. Tone: Is normal Sensation-intact light touch all over with no extinction Coordination: Difficult to perform finger-to-nose test with volitional high amplitude shaking of both arms but when adjusting her sheets etc., no dysmetria noted. Gait- deferred Labs I have reviewed labs in epic and the results pertinent to this consultation are: No leukocytosis, normal hemoglobin, mildly hyponatremic at 132, hyperkalemic with potassium 5.2,  CBC    Component Value Date/Time   WBC 4.5 12/27/2018 1456   RBC 4.26 12/27/2018 1456   HGB 13.7 12/27/2018 1456   HGB 14.2 10/16/2018 1225   HCT 42.7 12/27/2018 1456   HCT 41.1 10/16/2018 1225   PLT 311 12/27/2018 1456   PLT 287 10/16/2018 1225   MCV 100.2 (H) 12/27/2018 1456   MCV 95 10/16/2018 1225   MCH 32.2  12/27/2018 1456   MCHC 32.1 12/27/2018 1456   RDW 13.2 12/27/2018 1456   RDW 13.9 10/16/2018 1225   LYMPHSABS 1.2 12/27/2018 1456   LYMPHSABS 2.1 10/16/2018 1225   MONOABS 0.5 12/27/2018 1456   EOSABS 0.3 12/27/2018 1456   EOSABS 0.1 10/16/2018 1225   BASOSABS 0.1 12/27/2018 1456   BASOSABS 0.1 10/16/2018 1225    CMP     Component Value Date/Time   NA 135 12/27/2018 1456   NA 132 (L) 10/16/2018 1225   K 5.2 (H) 12/27/2018 1456  CL 97 (L) 12/27/2018 1456   CO2 25 12/27/2018 1456   GLUCOSE 93 12/27/2018 1456   BUN 9 12/27/2018 1456   BUN 12 10/16/2018 1225   CREATININE 0.86 12/27/2018 1456   CALCIUM 9.4 12/27/2018 1456   PROT 8.1 12/27/2018 1456   PROT 7.0 10/16/2018 1225   ALBUMIN 4.8 12/27/2018 1456   ALBUMIN 4.6 10/16/2018 1225   AST 34 12/27/2018 1456   ALT 19 12/27/2018 1456   ALKPHOS 150 (H) 12/27/2018 1456   BILITOT 0.8 12/27/2018 1456   BILITOT <0.2 10/16/2018 1225   GFRNONAA >60 12/27/2018 1456   GFRAA >60 12/27/2018 1456    Imaging I have reviewed the images obtained: MRI examination of the brain-shows nonspecific white matter hyperintensities with some involving the corpus callosum, overall mild lesion load.  No enhancement.  Moderately advanced white matter changes for age but stable from prior MRIs.  Small remote right thalamic lacune, but relatively new from 2019.  Overall in my opinion, the MRI is not very impressive for aggressive demyelinating multiple sclerosis. MRI of the C-spine does not show any demyelinating lesions from the scan of 03/27/2018.  Assessment: 63 year old woman past history of diagnosed multiple sclerosis in 1999 with multiple medications failure currently on Rituxan infusions, essential tremors on primidone, neuropathy on gabapentin, COPD being seen by pulmonology with worsening shortness of breath over past 2 years, anxiety, bipolar 1 disorder, depression brought into the ER for evaluation of multitude of complaints and an episode that  consisted of limb flailing and head bobbing along with depressed level of consciousness and eyes rolling back concerning for seizures. The patient's overall demeanor is more consistent with somebody with anxiety and depression and because of some inconsistencies in her exam due to give way weaknesses and excessive flailing and exacerbated tremoring, I suspect that her current presentation might not be entirely due to a exclusive neurological process. The negative MRI for active demyelination as well as the overall appearance of the MRI scan is also not extremely impressive for an aggressive demyelinating disorder. Reviewing her chart, she is on multiple medications that can lower seizure threshold or can also cause some exacerbation of tremors- bupropion can lose lower seizure threshold, opiates, tizanidine and primidone can be sedating, gabapentin can cause asterixis. That said, she does have a significant history of anxiety and depression, and could have had either epileptic or nonepileptic seizure spells. I would not recommend repeating any imaging but would definitely get an EEG.  Impression: History of MS - Not in exacerbation Anxiety, depression, bipolar 1 disorder Evaluate for seizures versus nonepileptic spells Polypharmacy -most concerning medications on the list are primidone, gabapentin, tizanidine, bupropion, Klonopin, mirtazapine, citalopram  Recommendations: Routine EEG in the morning She is on antiepileptic doses of primidone and not the doses that are usually recommended for essential tremor.  I would recommend reducing her primidone from 250 3 times daily to 250 nightly. I would also recommend reducing gabapentin from 800 3 times daily to 600 3 times daily. I would also recommend that the primary team work on reducing her opiate doses. Evaluate for any underlying UTI or chest x-ray, work-up till now unremarkable. I would recommend a detailed reconciliation of her medication list  and attempt to minimize sedating medications Psychiatry follow-up outpatient versus inpatient consult. We will follow-up on the EEG with you.  -- Amie Portland, MD [facility] Pager: [phone] If 7pm to 7am, please call on call as listed on AMION.

## 2018-12-27 NOTE — Telephone Encounter (Signed)
Patient speech was slurred she stated " I  Had a seizure twice this week and the one last night did quiet a bit of damage and I do not feel like coming today". Encouraged pt. To talk with her doctor and she stated that "I have a call into my doctor but they have not called me back. Patient will call and reschedule a later date when she is better,made Dr. Havery Moros aware and he stated "that is fine".

## 2018-12-28 ENCOUNTER — Other Ambulatory Visit: Payer: Self-pay

## 2018-12-28 ENCOUNTER — Observation Stay (HOSPITAL_COMMUNITY): Payer: Medicare Other

## 2018-12-28 DIAGNOSIS — F445 Conversion disorder with seizures or convulsions: Secondary | ICD-10-CM

## 2018-12-28 DIAGNOSIS — F319 Bipolar disorder, unspecified: Secondary | ICD-10-CM | POA: Diagnosis not present

## 2018-12-28 DIAGNOSIS — F411 Generalized anxiety disorder: Secondary | ICD-10-CM

## 2018-12-28 DIAGNOSIS — R569 Unspecified convulsions: Secondary | ICD-10-CM | POA: Diagnosis not present

## 2018-12-28 LAB — CBC
HCT: 38.8 % (ref 36.0–46.0)
Hemoglobin: 13 g/dL (ref 12.0–15.0)
MCH: 32.8 pg (ref 26.0–34.0)
MCHC: 33.5 g/dL (ref 30.0–36.0)
MCV: 98 fL (ref 80.0–100.0)
Platelets: 308 10*3/uL (ref 150–400)
RBC: 3.96 MIL/uL (ref 3.87–5.11)
RDW: 13 % (ref 11.5–15.5)
WBC: 4.7 10*3/uL (ref 4.0–10.5)
nRBC: 0 % (ref 0.0–0.2)

## 2018-12-28 LAB — BASIC METABOLIC PANEL
Anion gap: 15 (ref 5–15)
BUN: 8 mg/dL (ref 8–23)
CO2: 20 mmol/L — ABNORMAL LOW (ref 22–32)
Calcium: 9.2 mg/dL (ref 8.9–10.3)
Chloride: 102 mmol/L (ref 98–111)
Creatinine, Ser: 0.9 mg/dL (ref 0.44–1.00)
GFR calc Af Amer: >60 mL/min (ref >60)
GFR calc non Af Amer: >60 mL/min (ref >60)
Glucose, Bld: 61 mg/dL — ABNORMAL LOW (ref 70–99)
Potassium: 3.9 mmol/L (ref 3.5–5.1)
Sodium: 137 mmol/L (ref 135–145)

## 2018-12-28 MED ORDER — SUMATRIPTAN SUCCINATE 50 MG PO TABS
50.0000 mg | ORAL_TABLET | Freq: Once | ORAL | Status: AC
  Administered 2018-12-28: 50 mg via ORAL
  Filled 2018-12-28: qty 1

## 2018-12-28 MED ORDER — BUPROPION HCL ER (SR) 100 MG PO TB12
200.0000 mg | ORAL_TABLET | Freq: Two times a day (BID) | ORAL | Status: DC
  Administered 2018-12-28: 200 mg via ORAL
  Filled 2018-12-28 (×2): qty 2

## 2018-12-28 MED ORDER — CLONAZEPAM 0.5 MG PO TABS: 0.2500 mg | ORAL_TABLET | Freq: Every day | ORAL | Status: DC

## 2018-12-28 MED ORDER — HYDROXYZINE HCL 10 MG PO TABS
10.0000 mg | ORAL_TABLET | Freq: Three times a day (TID) | ORAL | Status: DC | PRN
  Administered 2018-12-28: 10 mg via ORAL
  Filled 2018-12-28 (×2): qty 1

## 2018-12-28 MED ORDER — GABAPENTIN 300 MG PO CAPS
600.0000 mg | ORAL_CAPSULE | Freq: Three times a day (TID) | ORAL | Status: DC
  Administered 2018-12-28 (×2): 600 mg via ORAL
  Filled 2018-12-28 (×2): qty 2

## 2018-12-28 MED ORDER — GABAPENTIN 300 MG PO CAPS: 600.0000 mg | ORAL_CAPSULE | Freq: Three times a day (TID) | ORAL | 0 refills | Status: DC

## 2018-12-28 MED ORDER — CLONAZEPAM 0.5 MG PO TABS: 0.5000 mg | ORAL_TABLET | Freq: Every evening | ORAL | Status: DC | PRN

## 2018-12-28 MED ORDER — PRIMIDONE 250 MG PO TABS
250.0000 mg | ORAL_TABLET | Freq: Every day | ORAL | Status: DC
  Filled 2018-12-28: qty 1

## 2018-12-28 MED ORDER — CLONAZEPAM 0.25 MG PO TBDP: 0.2500 mg | ORAL_TABLET | Freq: Every day | ORAL | 0 refills | Status: DC

## 2018-12-28 MED ORDER — SUCRALFATE 1 GM/10ML PO SUSP: 1.0000 g | Freq: Three times a day (TID) | ORAL | Status: DC | PRN

## 2018-12-28 MED ORDER — CLONAZEPAM 0.25 MG PO TBDP: 0.2500 mg | ORAL_TABLET | Freq: Every day | ORAL | Status: DC

## 2018-12-28 MED ORDER — CITALOPRAM HYDROBROMIDE 10 MG PO TABS
20.0000 mg | ORAL_TABLET | Freq: Every day | ORAL | Status: DC
  Administered 2018-12-28: 20 mg via ORAL
  Filled 2018-12-28: qty 2

## 2018-12-28 MED FILL — GABAPENTIN 300 MG CAPSULE: 300 | 30 days supply | Qty: 180 | Fill #0

## 2018-12-28 NOTE — Evaluation (Signed)
Occupational Therapy Evaluation Patient Details Name: Angel Maddox MRN: [id number] DOB: 09/08/1955 Today's Date: 12/28/2018    History of Present Illness Angel Maddox is a 63 y.o. female with medical history significant for COPD, multiple sclerosis, bipolar disorder, hypertension, who presented to the ED with multiple complaints ongoing over the past 2 to 3 days.  Complaints included chest discomfort, upper extremity weakness and tingling, difficulty swallowing, decreased urination, cough, difficulty breathing, sore throat, nasal congestion, generalized weakness.  Patient traveled to [facility] and came back 3 days ago.  Reportedly symptoms started shortly after coming back.   Clinical Impression   Pt admitted with above, and demonstrates the below listed deficits.  She requires min A for ADLs, and close min guard assist for functional transfers due to imbalance and tremors.   She lives with her spouse, who works next door to their home and can be available as much as needed at discharge (per pt report).   She reports she has been using a rollator for ambulation, and has required assist for ADLs, and has been sponge bathing recently due to fatigue.    She is very anxious during eval with tremors noted.  She is eager to discharge home.  Recommend HHOT/PT and aide.  All further OT needs can be addressed by [facility].  OT will sign off at this time.       Follow Up Recommendations  Home health OT;Supervision/Assistance - 24 hour;Other (comment)(HHaide )    Equipment Recommendations  None recommended by OT    Recommendations for Other Services       Precautions / Restrictions Precautions Precautions: Fall Restrictions Weight Bearing Restrictions: No      Mobility Bed Mobility Overal bed mobility: Modified Independent             General bed mobility comments: No assist needed. HOB flat, no use of rail.  Transfers Overall transfer level: Needs assistance Equipment used:  Rolling walker (2 wheeled) Transfers: Sit to/from Stand Sit to Stand: Min guard Stand pivot transfers: Min guard       General transfer comment: Min guard for safety. Stood from [employer].    Balance Overall balance assessment: Needs assistance Sitting-balance support: Feet supported;No upper extremity supported Sitting balance-Leahy Scale: Good Sitting balance - Comments: Able to reach down and adjust socks without difficulty. Postural control: Posterior lean Standing balance support: During functional activity Standing balance-Leahy Scale: Fair Standing balance comment: Able to stand at sink and brush hair with 1 UE support, needs BUE support for walking. Attempted to take hand off walker and mild instability noted.                           ADL either performed or assessed with clinical judgement   ADL Overall ADL's : Needs assistance/impaired Eating/Feeding: Modified independent;Sitting   Grooming: Wash/dry hands;Wash/dry face;Oral care;Brushing hair;Min guard;Standing   Upper Body Bathing: Set up;Sitting   Lower Body Bathing: Sit to/from stand;Minimal assistance   Upper Body Dressing : Set up;Sitting   Lower Body Dressing: Sit to/from stand;Minimal assistance   Toilet Transfer: Min guard;Ambulation;Comfort height toilet;RW;Grab bars   Toileting- Clothing Manipulation and Hygiene: Min guard;Sit to/from stand       Functional mobility during ADLs: Min guard;Rolling walker General ADL Comments: Pt requires mod cues for walker safety      Vision Baseline Vision/History: Wears glasses Wears Glasses: At all times Patient Visual Report: No change from baseline Vision Assessment?: No  apparent visual deficits     Perception Perception Perception Tested?: Yes   Praxis Praxis Praxis tested?: Within functional limits    Pertinent Vitals/Pain Pain Assessment: Faces Faces Pain Scale: Hurts a little bit Pain Location: head Pain Descriptors / Indicators:  Headache Pain Intervention(s): Monitored during session     Hand Dominance Right   Extremity/Trunk Assessment Upper Extremity Assessment Upper Extremity Assessment: Defer to OT evaluation(Tremoring of BUEs noted when asked to raise arms, otherwise no tremoring noted during functional tasks or walking) RUE Deficits / Details: This 63 y.o. admitted for abnormal spells that were concerning for sz, and was worked up.  EEG captured multiple jerking episodes and one episode of unresponsiveness with no associated epileptiform activity.  ? if her seizures are non -epileptic with psychosomatic etiology.  PMH includes:  MS, essential tremors, HTN, bipolar disorder, anxiety and depression. RUE Coordination: decreased gross motor;decreased fine motor LUE Deficits / Details: This 63 y.o. admitted for abnormal spells that were concerning for sz, and was worked up.  EEG captured multiple jerking episodes and one episode of unresponsiveness with no associated epileptiform activity.  ? if her seizures are non -epileptic with psychosomatic etiology.  PMH includes:  MS, essential tremors, HTN, bipolar disorder, anxiety and depression. LUE Coordination: decreased gross motor;decreased fine motor   Lower Extremity Assessment Lower Extremity Assessment: Generalized weakness(Grossly ~4/5 throughout during functional mobility, no buckling noted)   Cervical / Trunk Assessment Cervical / Trunk Assessment: Normal Cervical / Trunk Exceptions: tremulous movement/perturbation noted    Communication Communication Communication: No difficulties   Cognition Arousal/Alertness: Awake/alert Behavior During Therapy: WFL for tasks assessed/performed Overall Cognitive Status: No family/caregiver present to determine baseline cognitive functioning                                 General Comments: Difficulty answering questions and knowing timeline for questions related to PLOF.   General Comments  BP184/111.   Reinforced need to use RW at all times, and to stay inside the RW at all times, and not attempt to carry items     Exercises     Shoulder Instructions      Home Living Family/patient expects to be discharged to:: Private residence Living Arrangements: Spouse/significant other Available Help at Discharge: Family;Available PRN/intermittently Type of Home: House Home Access: Stairs to enter [employer] of Steps: 1 Entrance Stairs-Rails: None Home Layout: One level     Bathroom Shower/Tub: International aid/development worker Accessibility: Yes   Home Equipment: Environmental consultant - 4 wheels;Shower seat;Bedside commode;Wheelchair - manual;Cane - single point   Additional Comments: spouse owns a business that is next door to their home and can return home at any time he is needed       Prior Functioning/Environment Level of Independence: Needs assistance  Gait / Transfers Assistance Needed: Uses rollator for ambulation; walking in the park around 6-7 weeks ago. ADL's / Homemaking Assistance Needed: Assist with ADLs, getting in/out of tub   Comments: Likes to watch her grandkids and used to like to Psychologist, occupational.        OT Problem List: Decreased strength;Decreased activity tolerance;Impaired balance (sitting and/or standing);Decreased coordination;Decreased cognition;Decreased safety awareness;Impaired UE functional use      OT Treatment/Interventions:      OT Goals(Current goals can be found in the care plan section) Acute Rehab OT Goals Patient Stated Goal: to go home today  OT Goal Formulation: All assessment  and education complete, DC therapy  OT Frequency:     Barriers to D/C:            Co-evaluation PT/OT/SLP Co-Evaluation/Treatment: Yes Reason for Co-Treatment: For patient/therapist safety;Complexity of the patient's impairments (multi-system involvement);To address functional/ADL transfers PT goals addressed during session: Mobility/safety with mobility OT goals  addressed during session: ADL's and self-care      AM-PAC OT "6 Clicks" Daily Activity     Outcome Measure Help from another person eating meals?: None Help from another person taking care of personal grooming?: A Little Help from another person toileting, which includes using toliet, bedpan, or urinal?: A Little Help from another person bathing (including washing, rinsing, drying)?: A Little Help from another person to put on and taking off regular upper body clothing?: A Little Help from another person to put on and taking off regular lower body clothing?: A Little 6 Click Score: 19   End of Session Equipment Utilized During Treatment: Gait belt;Rolling walker Nurse Communication: Mobility status  Activity Tolerance: Patient tolerated treatment well Patient left: in bed;with call bell/phone within reach  OT Visit Diagnosis: Unsteadiness on feet (R26.81)                Time: 1406-1430 OT Time Calculation (min): 24 min Charges:  OT General Charges $OT Visit: 1 Visit OT Evaluation $OT Eval Moderate Complexity: 1 Mod  Lucille Passy, OTR/L [place] Pager [phone] Office [phone]   Lucille Passy M 12/28/2018, 3:07 PM

## 2018-12-28 NOTE — TOC Initial Note (Signed)
Transition of Care [facility]) - Initial/Assessment Note    Patient Details  Name: Angel Maddox MRN: [id number] Date of Birth: 1955/08/29  Transition of Care [facility]) CM/SW Contact:    Pollie Friar, RN Phone Number: 12/28/2018, 5:38 PM  Clinical Narrative:                   Expected Discharge Plan: [place] Services Barriers to Discharge: No Barriers Identified   Patient Goals and CMS Choice   CMS Medicare.gov Compare Post Acute Care list provided to:: Patient Choice offered to / list presented to : Patient  Expected Discharge Plan and Services Expected Discharge Plan: [place]   Discharge Planning Services: CM Consult Post Acute Care Choice: Home Health   Expected Discharge Date: 12/28/18                         HH Arranged: PT, OT HH Agency: [facility] Date [facility] Agency Contacted: 12/28/18   Representative spoke with at [place]: [place] Arrangements/Services   Lives with:: Spouse Patient language and need for interpreter reviewed:: Yes(no needs) Do you feel safe going back to the place where you live?: Yes            Criminal Activity/Legal Involvement Pertinent to Current Situation/Hospitalization: No - Comment as needed  Activities of Daily Living Home Assistive Devices/Equipment: Environmental consultant (specify type) ADL Screening (condition at time of admission) Patient's cognitive ability adequate to safely complete daily activities?: Yes Is the patient deaf or have difficulty hearing?: No Does the patient have difficulty seeing, even when wearing glasses/contacts?: No Does the patient have difficulty concentrating, remembering, or making decisions?: No Patient able to express need for assistance with ADLs?: Yes Does the patient have difficulty dressing or bathing?: No Independently performs ADLs?: Yes (appropriate for developmental age) Communication: Independent Dressing (OT): Independent Grooming:  Independent Is this a change from baseline?: Pre-admission baseline Feeding: Independent Bathing: Needs assistance Is this a change from baseline?: Pre-admission baseline Toileting: Independent In/Out Bed: Independent Walks in Home: Independent Does the patient have difficulty walking or climbing stairs?: Yes Weakness of Legs: Both Weakness of Arms/Hands: None  Permission Sought/Granted                  Emotional Assessment              Admission diagnosis:  Seizure-like activity [facility]) [R56.9] Patient Active Problem List   Diagnosis Date Noted  . Seizure-like activity ([place]) 12/27/2018  . Dyslipidemia 12/20/2018  . Hyponatremia 07/04/2018  . DOE (dyspnea on exertion) 07/03/2018  . COPD (chronic obstructive pulmonary disease) ([place]) 07/03/2018  . MDD (major depressive disorder) 06/28/2018  . GAD (generalized anxiety disorder) 06/28/2018  . Major depressive disorder, recurrent episode, moderate ([place]) 04/17/2018  . Head injury 12/08/2017  . Cervical vertebral fusion 07/28/2017  . DDD (degenerative disc disease), cervical 07/10/2017  . DDD (degenerative disc disease), thoracic 07/10/2017  . DDD (degenerative disc disease), lumbar 07/10/2017  . Facet arthritis of cervical region 07/10/2017  . Abnormal liver function tests 03/07/2017  . Hypokalemia 03/07/2017  . Gastroesophageal reflux disease with esophagitis 03/07/2017  . Esophageal dysphagia 03/07/2017  . Chronic idiopathic constipation 02/01/2017  . Fatigue 02/01/2017  . HSV-1 infection 01/12/2017  . Osteoporosis with pathological fracture, with routine healing, subsequent encounter 08/30/2016  . MDD (major depressive disorder), recurrent episode, severe ([place]) 03/29/2016  . Estrogen deficiency 12/15/2015  . MS (multiple  sclerosis) ([place]) 12/15/2015  . Tremor 12/15/2015  . Healthcare maintenance 12/15/2015  . Protein-calorie malnutrition, severe 11/09/2015  . Bipolar 1 disorder, depressed ([place]) 02/11/2015   PCP:   Terald Sleeper, PA-C Pharmacy:   [facility], [address] [place] [address] Phone: [phone] Fax: [phone]  [id number] - [place], [place] [address] Phone: [phone] Fax: [place], [place] [address] [place] [address] Phone: [phone] Fax: [place], [place] - [address] [place] [address] Phone: [phone] Fax: [phone]     Social Determinants of Health (SDOH) Interventions    Readmission Risk Interventions No flowsheet data found.

## 2018-12-28 NOTE — Procedures (Signed)
ELECTROENCEPHALOGRAM REPORT   Patient: Angel Maddox       Room #: 1O10R  Age: 63 y.o.        Sex: female Referring Physician: Wyline Copas Report Date:  12/28/2018        Interpreting Physician: Alexis Goodell  History: Angel Maddox is an 63 y.o. female with seizure like events  Medications:  Norvasc, ASA, Wellbutrin, Celexa, Klonopin, Neurontin, Mysoline, Valacyclovir  Conditions of Recording:  This is a 21 channel routine scalp EEG performed with bipolar and monopolar montages arranged in accordance to the international 10/20 system of electrode placement. One channel was dedicated to EKG recording.  The patient is in the awake state.  Description:  The waking background activity consists of a low voltage, symmetrical, fairly well organized, 8 Hz alpha activity, seen from the parieto-occipital and posterior temporal regions.  Low voltage fast activity, poorly organized, is seen anteriorly and is at times superimposed on more posterior regions.  A mixture of theta and alpha rhythms are seen from the central and temporal regions. The patient has multiple episodes of full body jerking during the record, both with and without intermittent photic stimulation.  Although some artifact was noted, no epileptiform activity was noted.  The patient also had an episode when she was unable to respond.  Again there was no associated epileptiform activity.   The patient does not drowse or sleep. Hyperventilation was not performed.   IMPRESSION: This is a normal awake electroencephalogram.  There are no focal lateralizing or epileptiform features. The patient has multiple jerking episodes and one episode of unresponsiveness with no associated epileptiform activity.      Alexis Goodell, MD Neurology [phone] 12/28/2018, 1:35 PM

## 2018-12-28 NOTE — Progress Notes (Signed)
EEG complete - results pending 

## 2018-12-28 NOTE — Evaluation (Signed)
Physical Therapy Evaluation Patient Details Name: Angel Maddox MRN: [id number] DOB: 23-Mar-1956 Today's Date: 12/28/2018   History of Present Illness  Angel Maddox is a 63 y.o. female with medical history significant for COPD, multiple sclerosis, bipolar disorder, hypertension, who presented to the ED with multiple complaints ongoing over the past 2 to 3 days.  Complaints included chest discomfort, upper extremity weakness and tingling, difficulty swallowing, decreased urination, cough, difficulty breathing, sore throat, nasal congestion, generalized weakness.  Patient traveled to [facility] and came back 3 days ago.  Reportedly symptoms started shortly after coming back.  Clinical Impression  Patient presents with headache, generalized weakness, tremors, impaired balance and impaired mobility s/p above. Pt Mod I PTA using rollator for ambulation and requires assist for ADLs. Lives with spouse who is not home all the time. Today, pt tolerated bed mobility, transfers and gait training with Min guard assist for balance/safety. Needs cues to maintain BUEs on RW at all times to prevent falls. Has been working with [place]. Recommend continuing this. Pt very anxious and shows inconsistencies during session. Demonstrating tremors in [place] when asked to lift arms but none shown when performing functional tasks at sink. Will follow acutely to maximize independence and mobility prior to return home.     Follow Up Recommendations Home health PT;Supervision for mobility/OOB    Equipment Recommendations  None recommended by PT    Recommendations for Other Services       Precautions / Restrictions Precautions Precautions: Fall Restrictions Weight Bearing Restrictions: No      Mobility  Bed Mobility Overal bed mobility: Modified Independent             General bed mobility comments: No assist needed. HOB flat, no use of rail.  Transfers Overall transfer level: Needs assistance Equipment  used: Rolling walker (2 wheeled) Transfers: Sit to/from Stand Sit to Stand: Min guard Stand pivot transfers: Min guard       General transfer comment: Min guard for safety. Stood from [employer].  Ambulation/Gait Ambulation/Gait assistance: Min guard Gait Distance (Feet): 150 Feet Assistive device: Rolling walker (2 wheeled) Gait Pattern/deviations: Step-through pattern;Decreased stride length;Trunk flexed Gait velocity: decreased   General Gait Details: Slow, mildly unsteady gait with RW but no overt LOB. 2/4 DOE. HR up to 115 bpm. Cues for RW proximity.  Stairs            Wheelchair Mobility    Modified Rankin (Stroke Patients Only)       Balance Overall balance assessment: Needs assistance Sitting-balance support: Feet supported;No upper extremity supported Sitting balance-Leahy Scale: Good Sitting balance - Comments: Able to reach down and adjust socks without difficulty. Postural control: Posterior lean Standing balance support: During functional activity Standing balance-Leahy Scale: Fair Standing balance comment: Able to stand at sink and brush hair with 1 UE support, needs BUE support for walking. Attempted to take hand off walker and mild instability noted.                             Pertinent Vitals/Pain Pain Assessment: Faces Faces Pain Scale: Hurts a little bit Pain Location: head Pain Descriptors / Indicators: Headache Pain Intervention(s): Monitored during session    Home Living Family/patient expects to be discharged to:: Private residence Living Arrangements: Spouse/significant other Available Help at Discharge: Family;Available PRN/intermittently Type of Home: House Home Access: Stairs to enter Entrance Stairs-Rails: None Entrance Stairs-Number of Steps: 1 Home Layout: One level Home  Equipment: Gilford Rile - 4 wheels;Shower seat;Bedside commode;Wheelchair - manual;Cane - single point Additional Comments: spouse owns a business that is  next door to their home and can return home at any time he is needed     Prior Function Level of Independence: Needs assistance   Gait / Transfers Assistance Needed: Uses rollator for ambulation; walking in the park around 6-7 weeks ago.  ADL's / Homemaking Assistance Needed: Assist with ADLs, getting in/out of tub  Comments: Likes to watch her grandkids and used to like to Psychologist, occupational.     Hand Dominance   Dominant Hand: Right    Extremity/Trunk Assessment   Upper Extremity Assessment Upper Extremity Assessment: Defer to OT evaluation(Tremoring of BUEs noted when asked to raise arms, otherwise no tremoring noted during functional tasks or walking) RUE Deficits / Details:  RUE Coordination: decreased gross motor;decreased fine motor LUE Deficits / Details LUE Coordination: decreased gross motor;decreased fine motor    Lower Extremity Assessment Lower Extremity Assessment: Generalized weakness(Grossly ~3+/5 throughout, no buckling noted.) RLE Coordination: decreased gross motor;decreased fine motor LLE Coordination: decreased gross motor;decreased fine motor    Cervical / Trunk Assessment Cervical / Trunk Assessment: Normal Cervical / Trunk Exceptions: tremulous movement/perturbation noted   Communication   Communication: No difficulties  Cognition Arousal/Alertness: Awake/alert Behavior During Therapy: Anxious Overall Cognitive Status: No family/caregiver present to determine baseline cognitive functioning                                 General Comments: Difficulty answering questions and knowing timeline for questions related to PLOF. Seems very anxious.      General Comments General comments (skin integrity, edema, etc.): BP184/111.  Reinforced need to use RW at all times, and to stay inside the RW at all times, and not attempt to carry items     Exercises     Assessment/Plan    PT Assessment Patient needs continued PT services  PT Problem List  Decreased strength;Decreased activity tolerance;Decreased mobility;Decreased balance;Decreased coordination;Pain       PT Treatment Interventions Therapeutic activities;DME instruction;Gait training;Therapeutic exercise;Patient/family education;Balance training;Functional mobility training    PT Goals (Current goals can be found in the Care Plan section)  Acute Rehab PT Goals Patient Stated Goal: to go home today  PT Goal Formulation: With patient Time For Goal Achievement: 01/11/19 Potential to Achieve Goals: Good    Frequency Min 3X/week   Barriers to discharge Decreased caregiver support home alone a lot of the day    Co-evaluation   Reason for Co-Treatment: For patient/therapist safety;Complexity of the patient's impairments (multi-system involvement);To address functional/ADL transfers PT goals addressed during session: Mobility/safety with mobility OT goals addressed during session: ADL's and self-care       AM-PAC PT "6 Clicks" Mobility  Outcome Measure Help needed turning from your back to your side while in a flat bed without using bedrails?: None Help needed moving from lying on your back to sitting on the side of a flat bed without using bedrails?: None Help needed moving to and from a bed to a chair (including a wheelchair)?: None Help needed standing up from a chair using your arms (e.g., wheelchair or bedside chair)?: A Little Help needed to walk in hospital room?: A Little Help needed climbing 3-5 steps with a railing? : A Little 6 Click Score: 21    End of Session Equipment Utilized During Treatment: Gait belt Activity Tolerance: Patient tolerated treatment  well Patient left: in bed;with call bell/phone within reach;with bed alarm set Nurse Communication: Mobility status PT Visit Diagnosis: Unsteadiness on feet (R26.81);Other abnormalities of gait and mobility (R26.89);Muscle weakness (generalized) (M62.81);Pain Pain - part of body: (head)    Time:  1406-1430 PT Time Calculation (min) (ACUTE ONLY): 24 min   Charges:   PT Evaluation $PT Eval Low Complexity: 1 Low          Wray Kearns, PT, DPT Acute Rehabilitation Services Pager [phone] Office [place] 12/28/2018, 3:12 PM

## 2018-12-28 NOTE — TOC Transition Note (Signed)
Transition of Care [facility]) - CM/SW Discharge Note   Patient Details  Name: Angel Maddox MRN: [id number] Date of Birth: [birth year]  Transition of Care [facility]) CM/SW Contact:  Pollie Friar, RN Phone Number: 12/28/2018, 5:38 PM   Clinical Narrative:    Pt discharging home with [facility] services. Tiffany with [facility] accepted the referral. Pt has transportation home.   Final next level of care: Home w Home Health Services Barriers to Discharge: No Barriers Identified   Patient Goals and CMS Choice   CMS Medicare.gov Compare Post Acute Care list provided to:: Patient Choice offered to / list presented to : Patient  Discharge Placement                       Discharge Plan and Services   Discharge Planning Services: CM Consult Post Acute Care Choice: Home Health                    HH Arranged: PT, OT [facility] Agency: [facility] Date [place]: 12/28/18   Representative spoke with at [place]: [place] ([place]) Interventions     Readmission Risk Interventions No flowsheet data found.

## 2018-12-28 NOTE — Progress Notes (Signed)
NEUROLOGY PROGRESS NOTE  Subjective: Angel Maddox is a 63 y.o. female with medical history significant of COPD, MS currently on Rituxan infusions, bipolar disorder, HTN, essential tremors, neuropathy, anxiety, and depression who presents with episodes of seizure-like activity.   She was recently discharged from [facility] after being hospitalized due to generalized weakness and malaise.  During the hospitalization, she was treated for hyponatremia with sodium level 124.  HCTZ was discontinued.  MRI brain completed for possible MS flare without findings of active demyelination.  She had a stroke work-up at that time.  She now returns with complaints of seizure-like activity since Sunday 6/28. According to husband report, it appears that she had full body shaking lasting approx 5 mins, and at least 2 spells of depressed level of consciousness, her head bobbing front to back, making mumbling sounds that make no sense with her eyes rolled back and arms and legs flopping around followed by a possible postictal state that was short in duration. She did not injure herself, did not have any bowel/bladder dysfunction or tongue biting during these episodes.  Patient did report that her son passed away due to a drug overdose recently filling her with grief and worsening of anxiety/depression sxs, for which she regularly sees a therapist regularly. No suicidal/homicidal ideations.  Today she reports feeling at her baseline with no more seizure like episodes overnight. She does report she feels very anxious as she did not get her nightly Klonopin last night and she reports some dyspnea due to not having her albuterol tx. Also reports intermittent headaches, responsive to tylenol. She is requesting to be discharged today.  Exam: Blood pressure 140/88, pulse 66, temperature 99.3 F (37.4 C), temperature source Oral, resp. rate 13, height 5\' 7"  (1.702 m), weight 62.3 kg, SpO2 99 %.  Physical Exam   Constitutional- WDWN female, appears slightly anxious but pleasant and in no acute distress HEENT-  Normocephalic, no lesions, without obvious abnormality.  Normal external eye and conjunctiva.   Cardiovascular- S1-S2 audible, pulses palpable throughout   Lungs- no rhonchi or wheezing noted, no excessive working breathing.  Saturations within normal limits Abdomen- All 4 quadrants palpated and nontender Extremities- Warm, dry and intact Musculoskeletal- no joint tenderness, deformity or swelling Skin- warm and dry, no hyperpigmentation, vitiligo, or suspicious lesions  Neuro:  Mental Status: Alert, oriented, thought content appropriate.  Speech slightly delayed without evidence of aphasia.  Able to follow 3 step commands without difficulty. Cranial Nerves: II:  Visual fields grossly normal,  III,IV, VI: ptosis not present, extra-ocular motions intact bilaterally pupils equal, round, reactive to light and accommodation V,VII: smile symmetric, facial light touch sensation normal bilaterally. Mild flattening of L nasolabial fold. VIII: hearing normal bilaterally IX,X: Palate rises midline XI: bilateral shoulder shrug XII: midline tongue extension Motor: Right : Upper extremity  4+/5                 Left:     Upper extremity   4-/5  Lower extremity   4+/5      Lower extremity   3/5 Tone and bulk:normal tone throughout; no atrophy noted Sensory: Pinprick and light touch intact throughout, bilaterally Deep Tendon Reflexes: 2+ and symmetric throughout Plantars: Right: downgoing   Left: downgoing Cerebellar: Difficulty with finger-to-nose, rapid alternating movements and heel-to-shin test due to intentional tremor Gait: deferred d/t fall risk   Medications:  . amLODipine  5 mg Oral Daily  . aspirin EC  81 mg Oral Daily  .  atorvastatin  40 mg Oral q1800  . enoxaparin (LOVENOX) injection  40 mg Subcutaneous Q24H  . gabapentin  600 mg Oral TID  . primidone  250 mg Oral QHS  . sodium  chloride flush  3 mL Intravenous Q12H  . valACYclovir  1,000 mg Oral Daily   PRN meds: sodium chloride, acetaminophen **OR** acetaminophen, clonazePAM, ipratropium-albuterol, ondansetron **OR** ondansetron (ZOFRAN) IV, oxyCODONE, sodium chloride flush, tiZANidine  Pertinent Labs/Diagnostics: - MRI significant for small R thalamic lacunar infarct, chronic in appearance, new relative to 2019 - CTH no abnormal findings - US carotid mild atherosclerotic dz of bilateral carotid arteries, ICA stenosis less than 50%  EEG: This is a normal awake electroencephalogram.  There are no focal lateralizing or epileptiform features. The patient has multiple jerking episodes and one episode of unresponsiveness with no associated epileptiform activity.      Posey Pronto PA-C [facility]  Assessment: Angel Maddox is a 63 yo F with PMH of COPD, MS on Rituxan infusions, essential tremors on primidone, HTN, bipolar disorder, anxiety, and depression who was admitted for changes in mental status and concern for seizure like episodes. Imaging and labs have been unremarkable so far for acute pathologies.  1) Patient was admitted approx 9 days ago to [facility] and treated for hyponatremia in 120s and taken off HCTZ. Her Na levels are nml now. Seizure like activity could potentially have been a result of low Na levels at home, but this is felt to be unlikely given #2, below.  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 Of note, she has had recent loss of her son and on-going anxiety and depression in the setting of bipolar disorder.    Recommendations: 1) Can be discharged from neuro perspective pending PT/OT eval 2) Pt takes klonopin every night for sleep, changed order from prn to scheduled in setting of on-going anxiety and to  avoid benzo withdrawal. Also decreased dose to 1/2 of home dose while in hospital. 3) restarted home celexa and wellbutrin given hx of anxiety and depression. 4) Outpatient PCP and Psychology follow ups on discharge 5) Neurology will sign off. Please call if there are additional questions.   Electronically signed: Dr. Kerney Elbe

## 2018-12-28 NOTE — Discharge Summary (Signed)
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 generally detectable in upper and lower  respiratory specimens during the acute phase of infection. The lowest  concentration of SARS-CoV-2 viral copies this assay can detect is 250  copies / mL. A negative result does not preclude SARS-CoV-2 infection  and should not be used as the sole basis for treatment or other  patient management decisions.  A negative result may occur with  improper specimen collection / handling, submission of specimen other  than nasopharyngeal swab, presence of viral mutation(s) within the  areas targeted by this  assay, and inadequate number of viral copies  (<250 copies / mL). A  negative result must be combined with clinical  observations, patient history, and epidemiological information. If result is POSITIVE SARS-CoV-2 target nucleic acids are DETECTED. The SARS-CoV-2 RNA is generally detectable in upper and lower  respiratory specimens dur ing the acute phase of infection.  Positive  results are indicative of active infection with SARS-CoV-2.  Clinical  correlation with patient history and other diagnostic information is  necessary to determine patient infection status.  Positive results do  not rule out bacterial infection or co-infection with other viruses. If result is PRESUMPTIVE POSTIVE SARS-CoV-2 nucleic acids MAY BE PRESENT.   A presumptive positive result was obtained on the submitted specimen  and confirmed on repeat testing.  While 2019 novel coronavirus  (SARS-CoV-2) nucleic acids may be present in the submitted sample  additional confirmatory testing may be necessary for epidemiological  and / or clinical management purposes  to differentiate between  SARS-CoV-2 and other Sarbecovirus currently known to infect humans.  If clinically indicated additional testing with an alternate test  methodology [phone]) is advised. The SARS-CoV-2 RNA is generally  detectable in upper and lower respiratory sp ecimens during the acute  phase of infection. The expected result is Negative. Fact Sheet for Patients:  StrictlyIdeas.no Fact Sheet for Healthcare Providers: BankingDealers.co.za This test is not yet approved or cleared by the [place] FDA and has been authorized for detection and/or diagnosis of SARS-CoV-2 by FDA under an Emergency Use Authorization (EUA).  This EUA will remain in effect (meaning this test can be used) for the duration of the COVID-19 declaration under Section 564(b)(1) of the Act, 21 U.S.C. section 360bbb-3(b)(1), unless the authorization is terminated or revoked sooner. Performed  at [facility], [address], [place], [place] [address]   Culture, blood (routine x 2)     Status: None   Collection Time: 12/19/18  1:57 PM   Specimen: BLOOD  Result Value Ref Range Status   Specimen Description BLOOD SITE NOT SPECIFIED  Final   Special Requests   Final    BOTTLES DRAWN AEROBIC AND ANAEROBIC Blood Culture adequate volume   Culture   Final    NO GROWTH 5 DAYS Performed at [facility], [address] [place], [place] [address]    Report Status 12/24/2018 FINAL  Final  Culture, blood (routine x 2)     Status: None   Collection Time: 12/19/18  1:57 PM   Specimen: BLOOD RIGHT ARM  Result Value Ref Range Status   Specimen Description BLOOD RIGHT ARM  Final   Special Requests   Final    BOTTLES DRAWN AEROBIC AND ANAEROBIC Blood Culture adequate volume   Culture   Final    NO GROWTH 5 DAYS Performed at [facility], [address], [place], [place] [address]    Report Status 12/24/2018 FINAL  Final  Respiratory Panel by PCR     Status: None   Collection Time: 12/19/18  5:57 PM   Specimen: Nasopharyngeal Swab; Respiratory  Result Value Ref Range Status   Adenovirus NOT DETECTED NOT DETECTED Final   Coronavirus 229E NOT DETECTED NOT DETECTED Final    Comment: (NOTE) The Coronavirus on the Respiratory Panel, DOES NOT test for the novel  Coronavirus (2019 nCoV)    Coronavirus HKU1 NOT DETECTED NOT DETECTED Final   Coronavirus NL63 NOT DETECTED NOT DETECTED Final   Coronavirus OC43 NOT DETECTED NOT DETECTED Final   Metapneumovirus NOT DETECTED  NOT DETECTED Final   Rhinovirus / Enterovirus NOT DETECTED NOT DETECTED Final   Influenza A NOT DETECTED NOT DETECTED Final   Influenza B NOT DETECTED NOT DETECTED Final   Parainfluenza Virus 1 NOT DETECTED NOT DETECTED Final   Parainfluenza Virus 2 NOT DETECTED NOT DETECTED Final   Parainfluenza Virus 3 NOT DETECTED NOT DETECTED Final   Parainfluenza Virus 4 NOT DETECTED NOT DETECTED Final   Respiratory Syncytial Virus  NOT DETECTED NOT DETECTED Final   Bordetella pertussis NOT DETECTED NOT DETECTED Final   Chlamydophila pneumoniae NOT DETECTED NOT DETECTED Final   Mycoplasma pneumoniae NOT DETECTED NOT DETECTED Final    Comment: Performed at [facility], [place] [address], [place], [place] [address]  SARS Coronavirus 2 (CEPHEID - Performed in [place] hospital lab), Hosp Order     Status: None   Collection Time: 12/27/18  5:28 PM   Specimen: Nasopharyngeal Swab  Result Value Ref Range Status   SARS Coronavirus 2 NEGATIVE NEGATIVE Final    Comment: (NOTE) If result is NEGATIVE SARS-CoV-2 target nucleic acids are NOT DETECTED. The SARS-CoV-2 RNA is generally detectable in upper and lower  respiratory specimens during the acute phase of infection. The lowest  concentration of SARS-CoV-2 viral copies this assay can detect is 250  copies / mL. A negative result does not preclude SARS-CoV-2 infection  and should not be used as the sole basis for treatment or other  patient management decisions.  A negative result may occur with  improper specimen collection / handling, submission of specimen other  than nasopharyngeal swab, presence of viral mutation(s) within the  areas targeted by this assay, and inadequate number of viral copies  (<250 copies / mL). A negative result must be combined with clinical  observations, patient history, and epidemiological information. If result is POSITIVE SARS-CoV-2 target nucleic acids are DETECTED. The SARS-CoV-2 RNA is generally detectable in upper and lower  respiratory specimens dur ing the acute phase of infection.  Positive  results are indicative of active infection with SARS-CoV-2.  Clinical  correlation with patient history and other diagnostic information is  necessary to determine patient infection status.  Positive results do  not rule out bacterial infection or co-infection with other viruses. If result is PRESUMPTIVE POSTIVE SARS-CoV-2 nucleic acids  MAY BE PRESENT.   A presumptive positive result was obtained on the submitted specimen  and confirmed on repeat testing.  While 2019 novel coronavirus  (SARS-CoV-2) nucleic acids may be present in the submitted sample  additional confirmatory testing may be necessary for epidemiological  and / or clinical management purposes  to differentiate between  SARS-CoV-2 and other Sarbecovirus currently known to infect humans.  If clinically indicated additional testing with an alternate test  methodology [phone]) is advised. The SARS-CoV-2 RNA is generally  detectable in upper and lower respiratory sp ecimens during the acute  phase of infection. The expected result is Negative. Fact Sheet for Patients:  StrictlyIdeas.no Fact Sheet for Healthcare Providers: BankingDealers.co.za This test is not yet approved or cleared by the [place] FDA and has been authorized for detection and/or diagnosis of SARS-CoV-2 by FDA under an Emergency Use Authorization (EUA).  This EUA will remain in effect (meaning this test can be used) for the duration of the COVID-19 declaration under Section 564(b)(1) of the Act, 21 U.S.C. section 360bbb-3(b)(1), unless the authorization is terminated or revoked sooner. Performed at [facility], [place] [address], [place], [place] [address]  Time spent: 30 min  SIGNED:   Marylu Lund, MD  [facility] 12/28/2018, 3:33 PM  If 7PM-7AM, please contact night-coverage

## 2018-12-31 ENCOUNTER — Ambulatory Visit: Payer: Self-pay | Admitting: Endocrinology

## 2019-01-01 ENCOUNTER — Other Ambulatory Visit: Payer: Self-pay

## 2019-01-01 ENCOUNTER — Ambulatory Visit (INDEPENDENT_AMBULATORY_CARE_PROVIDER_SITE_OTHER): Payer: Medicare Other | Admitting: Internal Medicine

## 2019-01-01 ENCOUNTER — Encounter: Payer: Self-pay | Admitting: Internal Medicine

## 2019-01-01 DIAGNOSIS — G35 Multiple sclerosis: Secondary | ICD-10-CM

## 2019-01-01 NOTE — Progress Notes (Signed)
HPI  63 yo F former smoker followed for asthma/ COPD, dyspnea, complicated by Multiple Sclerosis,  GERD, Chronic constipation,  Osteoporosis, Depression/ Anxiety, Malnutrition, Hyponatremia, abnormal LFTs  ----------------------------------------------------------------------------  HPI4/23/2020- 63 yo F former smoker FOLLOWS FOR: Former MW pt, switching to [employer]. Reports having issues with shortness of breath, chest congestion, coughing.  Previous eval by Dr Melvyn Novas- notes reviewed. His impression was IBS. Medical problem list includes COPD, GERD, Chronic constipation, Multiple Sclerosis, Osteoporosis, Depression/ Anxiety, Malnutrition, Hyponatremia, abnormal LFTs Medication includes Anoro, albuterol hfa, clonazepam, gabapentin, welbutrin, citalopram, mirtazapine, provigil,   C/O dyspnea with sense of chest congestion but minimal white sputum. Onset about 1.5 years ago, no definite trigger remembered, gradually worse. Feels something should come out, but dry cough.Symbicort initially helped, but not any longer. Rescue inhaler helps some. Change to Anoro w/o benefit.  Ok supine and breathing doesn't wake her- this is mostly DOE w/ ADLs. Denies heart or prior lung condition. Remote former smoker. Not able to perform PFT maneuvers on initial trial and wants to try again when Covid rules allow.  Has not had cardiac w/u. Pror physical therapy - little effect.  Uses rolling walker due to MS. Her MS neurologist, Dr Gorden Harms, has moved several times and she has followed him. He told her didn't think symptoms were due to MS.   CTa chest 06/05/18  increased bronchial thickening s emphysema - unable to perform pfts 07/03/18 - 09/17/2018   Walked RA x one lap =  approx 250 ft - stopped due to  Sob with sats high 90's and obvious breath holding then gasping expiratory efforts -Quit smoking 1992 with onset doe 2018 in setting of MS/ anxiety  - CTa chest 06/05/18  increased bronchial thickening s emphysema -  Spirometry 07/03/2018  Not physiologic - 07/03/2018   Walked RA x 10 ft - stopped due to sob and sats 89%   - 07/03/2018 alpha one screen   MS  Level 111  - 08/02/2018   symbicort 80 2bid  - 09/17/2018  After extensive coaching inhaler device,  effectiveness =    75% from a baseline of around 25 % > continue symbicort/ consider trial of spiriva next step     CXR 09/17/2018--Emphysematous changes without infiltrate.- CBC 10/16/2018- WNL CMP 10/16/2018- elevated transaminases, Na 132  11/08/2018- Virtual Visit via Telephone Note  I connected with Angel Maddox on 11/08/18 at  9:30 AM EDT by telephone and verified that I am speaking with the correct person using two identifiers.  Location: Patient: home Provider: office   I discussed the limitations, risks, security and privacy concerns of performing an evaluation and management service by telephone and the availability of in person appointments. I also discussed with the patient that there may be a patient responsible charge related to this service. The patient expressed understanding and agreed to proceed.   History of Present Illness:  63 yo F former smoker followed for asthma, dyspnea, complicated by Multiple Sclerosisi, Anxiety Medical problem list includes COPD, GERD, Chronic constipation, Multiple Sclerosis, Osteoporosis, Depression/ Anxiety, Malnutrition, Hyponatremia, abnormal LFTs Medication includes  albuterol hfa, Neb         clonazepam, gabapentin, welbutrin, citalopram, mirtazapine, provigil,  Still pending repeat PFT (for better effort) and Echo -----Tele-Visit re: Patient reports increased SOB worse than her normal SOB. She reports a non-productive cough. She states she does have some chest soreness along her right shoulder blade. She states she is using her nebulizer which has helped. Anoro stopped helping. During flare  needed neb 3x/d- had bad cough, chills, no fever, sore across chest and R shoulder blade. Reflux- seeing GI, pending  endoscopy Due to MS transportation difficult to get here.   Observations/Objective: CXR 09/17/2018- Emphysematous changes without infiltrate. Dry cough heard over phone.  Assessment and Plan: Asthma/ COPD- difficult to separate from MS weakness. MS- follows Neuro Reflux- follows GI. Aspiration risk. Plan- elevate HOB Follow Up Instructions: 4 months   I discussed the assessment and treatment plan with the patient. The patient was provided an opportunity to ask questions and all were answered. The patient agreed with the plan and demonstrated an understanding of the instructions.   The patient was advised to call back or seek an in-person evaluation if the symptoms worsen or if the condition fails to improve as anticipated.  I provided 21 minutes of non-face-to-face time during this encounter.   Baird Lyons, MD   01/01/2019- 63 yo F former smoker followed for asthma/ COPD, dyspnea, complicated by Multiple Sclerosis, Anxiety,  GERD, Chronic constipation,  Osteoporosis, Depression/ Anxiety, Malnutrition, Hyponatremia, abnormal LFTs Medication includes  albuterol hfa, Neb Duoneb, clonazepam, gabapentin, welbutrin, citalopram, mirtazapine, provigil, Rituxan Still pending repeat PFT (for better effort) and Echo Had televisit 5/14. -----pt states breathing has been worse, using neb 3-4 times daily; uses albuterol PRN. Feels some benefit from nebulizer for sense of chest tightness and soreness mid anterior chest. "Why can't I walk more?" Little cough or wheeze, occ sl chest rattle non productive. No palpitation or angina.  She is willing to retry PFT. Discussed limited pulmonary findings of some emphysema on CXR and some subjective response to nebulizer as c/w mild COPD. She seemed to have some trouble following simple explanation. CXR 1V- 12/19/2018- IMPRESSION: No acute abnormality. ECHO 12/20/18- EF 60-65%, WNL  ROS-see HPI   + = positive Constitutional:    weight loss, night sweats,  fevers, chills, fatigue, lassitude. HEENT:    headaches, difficulty swallowing, tooth/dental problems, sore throat,       sneezing, itching, ear ache, nasal congestion, post nasal drip, snoring CV:    chest pain, orthopnea, PND, swelling in lower extremities, anasarca,                                  dizziness, palpitations Resp:   +shortness of breath with exertion or at rest.                +productive cough,   +non-productive cough, coughing up of blood.              change in color of mucus.  wheezing.   Skin:    rash or lesions. GI:  No-   heartburn, indigestion, +abdominal pain, nausea, vomiting, diarrhea,                 change in bowel habits, loss of appetite GU: dysuria, change in color of urine, no urgency or frequency.   flank pain. MS:   joint pain, stiffness, decreased range of motion, back pain. Neuro-   + MS Psych:  change in mood or affect.  depression or +anxiety.   memory loss.  OBJ- Physical Exam General- Alert, Oriented, Affect-appropriate, Distress- none acute, + rolling walker Skin- rash-none, lesions- none, excoriation- none Lymphadenopathy- none Head- atraumatic            Eyes- Gross vision intact, PERRLA, conjunctivae and secretions clear  Ears- Hearing, canals-normal            Nose- Clear, no-Septal dev, mucus, polyps, erosion, perforation             Throat- Mallampati II , mucosa clear , drainage- none, tonsils- atrophic Neck- flexible , trachea midline, no stridor , thyroid nl, carotid no bruit Chest - symmetrical excursion , unlabored           Heart/CV- RRR , no murmur , no gallop  , no rub, nl s1 s2                           - JVD- none , edema- none, stasis changes- none, varices- none           Lung- clear to P&A, wheeze- none, cough- none , dullness-none, rub- none           Chest wall-  Abd-  Br/ Gen/ Rectal- Not done, not indicated Extrem- cyanosis- none, clubbing, none, atrophy- none, strength- nl Neuro- grossly intact to  observation

## 2019-01-01 NOTE — Assessment & Plan Note (Signed)
She feels she is in exacerbation and is encouraged to get in touch with her Neurologist.

## 2019-01-01 NOTE — Patient Instructions (Signed)
Order- schedule PFT     Dx COPD mixed type  Ok to continue present meds

## 2019-01-01 NOTE — Assessment & Plan Note (Signed)
Probable mild COPD with some subjective response to nebulizer. Much symptom overlap with weakness Likely from her MS. I may not be able to offer much as discussed, but she will try again to perform a PFT.

## 2019-01-05 ENCOUNTER — Other Ambulatory Visit: Payer: Self-pay | Admitting: Gastroenterology

## 2019-01-05 ENCOUNTER — Other Ambulatory Visit: Payer: Self-pay | Admitting: Physician Assistant

## 2019-01-05 DIAGNOSIS — B009 Herpesviral infection, unspecified: Secondary | ICD-10-CM

## 2019-01-05 DIAGNOSIS — R3989 Other symptoms and signs involving the genitourinary system: Secondary | ICD-10-CM

## 2019-01-07 ENCOUNTER — Other Ambulatory Visit: Payer: Self-pay

## 2019-01-07 ENCOUNTER — Ambulatory Visit (INDEPENDENT_AMBULATORY_CARE_PROVIDER_SITE_OTHER): Payer: Medicare Other | Admitting: Psychiatry

## 2019-01-07 DIAGNOSIS — F411 Generalized anxiety disorder: Secondary | ICD-10-CM

## 2019-01-07 NOTE — Progress Notes (Signed)
[facility] Counselor/Therapist Progress Note  Patient ID: Angel Maddox, MRN: [id number],    Date: 01/07/2019  Time Spent: 60 minutes  1:00pm to 2:00pm  Treatment Type: Individual Therapy    *Any new medication changes or health concerns since last appt: Patient reports that since last appt, she was hospitalized at [facility] in [place] overnight and also at [facility] in [place] overnight 1 night. Dr at hospital who did discharge summary recommended her Citalopram (Celexa) be reduced to 20mg , however patient states she is choosing to continue the 40mg  but is agreeable to speaking with Donnal Moat, PA-C about her medication and reluctance to decrease her Citalopram. (patient states she called and spoke with nurse here about that.)  At [facility] patient states she was told she had a "small stroke".  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    Reported Symptoms:   Anxiety and "worries" easily, anger, hurt,  Loneliness, communication issues with husband have improved "a little bit", emotionally struggles with her MS and her recent 2 overnight hospitalizations for "small stroke" and seizure-like activity.    Mental Status Exam:  Appearance:   casual  Behavior:  Appropriate and Sharing  Motor:  Normal  (except symptoms related to her MS)  Speech/Language:   Normal Rate  Affect:  Anxious, frustrated  Mood:  Anxiety and some depressed mood, less irritable today  Thought process:  normal  Thought content:    WNL  Sensory/Perceptual disturbances:    WNL  Orientation:  oriented to person, place, time/date, situation, day of week, month of year and year  Attention:  Fair  Concentration:  Fair  Memory:  Fair, per patient  Fund of knowledge:   Good  Insight:    Fair  Judgment:   Good  Impulse Control:  Good   Risk Assessment: Danger to Self:  No Self-injurious Behavior:  No Danger to Others: No Duty to Warn:no Physical Aggression / Violence:No  Access to Firearms a concern: No  Gang Involvement:No   Subjective:    Patient in today reporting symptoms listed above. Some stronger today that last visit however is struggling emotionally as well as physically with her MS. Talked about her frustration, fears, frustration with symptoms and medical community at times.  Having some breathing issues that she was seen for on July 7th and to have some eventual testing done per pulmonologist Dr. Keturah Barre. To return to Dr. Annamaria Boots for testing in November (delayed due to virus pandemic).  Is frustrated, anxious, "hurt feelings", lonely "at times", and some depressed mood. Reviewed info from last session and patient state she is still having some communication issues with husband but their behavior towards each other "has not been quite as hurtful." "I noticed he was some better after I was in hospital recently.  Continued concerns and fears about "how I'm going to be with my MS in the future", which patient processed in more detail again today.  Seemed to have some relief from just talking it out some.  Also reviewed strategies that can help her cope more effectively with her physical issues as well as the emotional side of it as well.  Later did state "some days are better than others."  Before session end, she mentioned some unresolved grief about her parents to follow up on next session.  (Reviewed with patient) Encouraged her to practie good self-care (physically  and emotionally) as she gets through this time of concerns about marriage) and also the whole coronavirus (fear of illness and isolation) with patient being at risk due to her other health issues. For now her focus is to stay on her meds as prescribed and continue working  to change her view from looking for what may go right versus wrong and also practice some better communication skills which we discussed today.       Interventions: Solution-focused  and Cognitive Behavioral therapy  Diagnosis:   ICD-10-CM   1. Generalized anxiety disorder  F41.1     Plan:  Patient to work with some CBT strategies mentioned above as well as other strategies we've discussed to help with anxiety, depression, communicating with husband, and situations that need re-framing.  Goal review with patient.  Next session in 1-2 wks.  [name], LCSW

## 2019-01-10 ENCOUNTER — Telehealth: Payer: Self-pay | Admitting: Physician Assistant

## 2019-01-10 ENCOUNTER — Other Ambulatory Visit: Payer: Self-pay

## 2019-01-10 NOTE — Telephone Encounter (Signed)
televisit

## 2019-01-10 NOTE — Telephone Encounter (Signed)
So does the patient want to come in or not, I am not clear on her answer.  I am okay with either way. Her husband has to accompany.

## 2019-01-10 NOTE — Telephone Encounter (Signed)
Has appointment with Glenard Haring - please advise and send back to pools.

## 2019-01-10 NOTE — Telephone Encounter (Signed)
Good with me

## 2019-01-11 ENCOUNTER — Ambulatory Visit (INDEPENDENT_AMBULATORY_CARE_PROVIDER_SITE_OTHER): Payer: Medicare Other | Admitting: Physician Assistant

## 2019-01-11 ENCOUNTER — Encounter: Payer: Self-pay | Admitting: Physician Assistant

## 2019-01-11 DIAGNOSIS — M5136 Other intervertebral disc degeneration, lumbar region: Secondary | ICD-10-CM

## 2019-01-11 DIAGNOSIS — R569 Unspecified convulsions: Secondary | ICD-10-CM

## 2019-01-11 DIAGNOSIS — E871 Hypo-osmolality and hyponatremia: Secondary | ICD-10-CM

## 2019-01-11 DIAGNOSIS — I10 Essential (primary) hypertension: Secondary | ICD-10-CM

## 2019-01-11 DIAGNOSIS — G35 Multiple sclerosis: Secondary | ICD-10-CM

## 2019-01-11 DIAGNOSIS — E785 Hyperlipidemia, unspecified: Secondary | ICD-10-CM | POA: Diagnosis not present

## 2019-01-11 DIAGNOSIS — M5030 Other cervical disc degeneration, unspecified cervical region: Secondary | ICD-10-CM

## 2019-01-11 DIAGNOSIS — M47812 Spondylosis without myelopathy or radiculopathy, cervical region: Secondary | ICD-10-CM

## 2019-01-11 MED ORDER — OXYCODONE HCL 10 MG PO TABS: 10.0000 mg | ORAL_TABLET | Freq: Four times a day (QID) | ORAL | 0 refills | Status: DC | PRN

## 2019-01-11 NOTE — Progress Notes (Signed)
Telephone visit  Subjective: [id number] follow up seizure like activity PCP: Terald Sleeper, PA-C Angel Maddox is a 63 y.o. female calls for telephone consult today. Patient provides verbal consent for consult held via phone.  Patient is identified with 2 separate identifiers.  At this time the entire area is on COVID-19 social distancing and stay home orders are in place.  Patient is of higher risk and therefore we are performing this by a virtual method.  Location of patient: home Location of provider: [facility] Others present for call: no  She is having a follow up from the hospitalizations for seizure like activity. She was admitted a couple of months ago for respiratory illness and hyponatremia. She has recovered from that admission. We will have labs performed in the next month.  I have reviewed all of the notes from that admission.  They also thought that she might of had a very small stroke.  However Dr. Gorden Harms, her neurologist reviewed the films and he feels that it was more of an old MS lesion.  And she is in quite a flareup of her MS at this time.  She is a couple weeks away from getting her new infusion.  She always gets a little more flared up once the medication is running out.  Her admission to [facility] for the pseudoseizures was reviewed.  No significant medication changes were made.  They just recommended that she continue follow-up with PCP and her neurologist.  Which she is doing.  So we have reviewed all of her medications and we will send some refills in.  We will have her come back in in about a month to have labs performed that we will recheck her cholesterol, comprehensive metabolic, CBC, thyroid.    ROS: Per HPI  Allergies  Allergen Reactions  . Hydrocodone Itching   Past Medical History:  Diagnosis Date  . Anxiety   . Arthritis    osteo  . Bipolar 1 disorder ([place])   . Cervicalgia   . Chronic kidney disease    kidney stone  . COPD  (chronic obstructive pulmonary disease) ([place])    new diagnosis- now sees pulmonologist  . Depression   . Elevated liver enzymes    She says "normal now"  . GERD (gastroesophageal reflux disease)   . H/O hiatal hernia   . Headache   . Hip fracture ([place]) 2017   left  . History of kidney stones    4-5 yrs ago  . Hypertension   . Multiple sclerosis ([place])     Had for 15 years  . Multiple sclerosis ([place])    dx 1999  . Tremors of nervous system     Current Outpatient Medications:  .  amLODipine (NORVASC) 5 MG tablet, Take 1 tablet (5 mg total) by mouth daily., Disp: 30 tablet, Rfl: 1 .  aspirin EC 81 MG EC tablet, Take 1 tablet (81 mg total) by mouth daily., Disp: 90 tablet, Rfl: 0 .  atorvastatin (LIPITOR) 40 MG tablet, Take 1 tablet (40 mg total) by mouth daily at 6 PM., Disp: 30 tablet, Rfl: 1 .  benzonatate (TESSALON) 200 MG capsule, Take 1 capsule (200 mg total) by mouth 2 (two) times daily as needed for cough., Disp: 20 capsule, Rfl: 0 .  buPROPion (WELLBUTRIN SR) 200 MG 12 hr tablet, Take 1 tablet (200 mg total) by mouth 2 (two) times daily. (Patient taking differently: Take 200 mg by mouth daily. ), Disp: 60 tablet,  Rfl: 5 .  CARAFATE 1 GM/10ML suspension, TAKE 10 MLS (1 G TOTAL) BY MOUTH EVERY 8 (EIGHT) HOURS AS NEEDED., Disp: 420 mL, Rfl: 3 .  citalopram (CELEXA) 20 MG tablet, Take 1 tablet (20 mg total) by mouth daily., Disp: 30 tablet, Rfl: 0 .  clonazePAM (KLONOPIN) 0.25 MG disintegrating tablet, Take 1 tablet (0.25 mg total) by mouth at bedtime., Disp: , Rfl: 0 .  gabapentin (NEURONTIN) 300 MG capsule, Take 2 capsules (600 mg total) by mouth 3 (three) times daily., Disp: 180 capsule, Rfl: 0 .  ipratropium-albuterol (DUONEB) 0.5-2.5 (3) MG/3ML SOLN, 1 neb every 6 hours as needed for help breathing, Disp: 75 mL, Rfl: 12 .  mirtazapine (REMERON) 15 MG tablet, Take 0.5-1 tablets (7.5-15 mg total) by mouth at bedtime as needed. (Patient taking differently: Take 7.5-15 mg by mouth at  bedtime as needed (sleep). ), Disp: 30 tablet, Rfl: 5 .  Nebulizers (COMPRESSOR/NEBULIZER) MISC, Use as directed, Disp: 1 each, Rfl: 0 .  ondansetron (ZOFRAN-ODT) 4 MG disintegrating tablet, TAKE 1 TABLET BY MOUTH EVERY 8 HOURS AS NEEDED FOR NAUSEA AND VOMITING (Patient taking differently: Take 4 mg by mouth every 8 (eight) hours as needed for nausea or vomiting. ), Disp: 30 tablet, Rfl: 2 .  Oxycodone HCl 10 MG TABS, Take 1 tablet (10 mg total) by mouth every 6 (six) hours as needed ((score 7 to 10))., Disp: 120 tablet, Rfl: 0 .  primidone (MYSOLINE) 250 MG tablet, Take 250 mg by mouth 3 (three) times daily. , Disp: , Rfl:  .  PROAIR HFA 108 (90 Base) MCG/ACT inhaler, INHALE 2 PUFFS INTO THE LUNGS EVERY 4 (FOUR) HOURS AS NEEDED FOR WHEEZING OR SHORTNESS OF BREATH. (Patient taking differently: Inhale 2 puffs into the lungs every 4 (four) hours as needed for wheezing or shortness of breath. ), Disp: 8.5 Inhaler, Rfl: 1 .  pseudoephedrine-acetaminophen (TYLENOL SINUS) 30-500 MG TABS tablet, Take 1 tablet by mouth daily as needed (pain)., Disp: , Rfl:  .  riTUXimab (RITUXAN) 100 MG/10ML injection, Inject into the vein., Disp: , Rfl:  .  tizanidine (ZANAFLEX) 2 MG capsule, Take 2 mg by mouth every 8 (eight) hours as needed for muscle spasms. , Disp: , Rfl:  .  valACYclovir (VALTREX) 1000 MG tablet, TAKE 1 TABLET 2 TIMES DAILY. THEN STAY ON ONE TABLET DAILY AS PREVENTION, Disp: 40 tablet, Rfl: 5  Assessment/ Plan: 63 y.o. female   1. Hyponatremia syndrome - CMP14+EGFR; Future  2. Essential hypertension - CBC with Differential/Platelet; Future - CMP14+EGFR; Future - TSH; Future - Lipid panel; Future  3. MS (multiple sclerosis) (HCC) - CBC with Differential/Platelet; Future - CMP14+EGFR; Future - TSH; Future - Lipid panel; Future  4. Dyslipidemia - CBC with Differential/Platelet; Future - CMP14+EGFR; Future - TSH; Future - Lipid panel; Future  5. Seizure-like activity [facility]) Neurology has  already seen  6. DDD (degenerative disc disease), cervical - Oxycodone HCl 10 MG TABS; Take 1 tablet (10 mg total) by mouth every 6 (six) hours as needed ((score 7 to 10)).  Dispense: 120 tablet; Refill: 0  7. DDD (degenerative disc disease), lumbar - Oxycodone HCl 10 MG TABS; Take 1 tablet (10 mg total) by mouth every 6 (six) hours as needed ((score 7 to 10)).  Dispense: 120 tablet; Refill: 0  8. Facet arthritis of cervical region - Oxycodone HCl 10 MG TABS; Take 1 tablet (10 mg total) by mouth every 6 (six) hours as needed ((score 7 to 10)).  Dispense: 120 tablet;  Refill: 0   Return in about 2 months (around 03/14/2019) for recheck medications.  Continue all other maintenance medications as listed above.  Start time: 10:15 AM End time: 10:32 AM No orders of the defined types were placed in this encounter.   Particia Nearing PA-C [place] [phone]

## 2019-01-15 ENCOUNTER — Telehealth: Payer: Self-pay | Admitting: Physician Assistant

## 2019-01-15 NOTE — Telephone Encounter (Signed)
Needs prior authorization [phone]

## 2019-01-15 NOTE — Telephone Encounter (Signed)
Prior Auth for Oxycodone HCL 10mg -APPROVED valid 01/15/19-06/27/19  [id number]

## 2019-01-18 ENCOUNTER — Telehealth: Payer: Self-pay | Admitting: Gastroenterology

## 2019-01-18 NOTE — Telephone Encounter (Signed)
Answered and reviewed instruction for EGD with patient over the phone.

## 2019-01-18 NOTE — Telephone Encounter (Signed)
Pt requested a call back to discuss prep instructions for EGD.

## 2019-01-21 ENCOUNTER — Ambulatory Visit: Payer: Medicare Other | Admitting: Psychiatry

## 2019-01-22 ENCOUNTER — Other Ambulatory Visit: Payer: Self-pay | Admitting: Physician Assistant

## 2019-01-22 ENCOUNTER — Telehealth: Payer: Self-pay | Admitting: Gastroenterology

## 2019-01-22 ENCOUNTER — Ambulatory Visit (HOSPITAL_COMMUNITY)
Admission: RE | Admit: 2019-01-22 | Discharge: 2019-01-22 | Disposition: A | Discharge status: Home / Self Care | Payer: Medicare Other | Source: Ambulatory Visit | Attending: Psychiatry | Admitting: Psychiatry

## 2019-01-22 ENCOUNTER — Other Ambulatory Visit: Payer: Self-pay

## 2019-01-22 ENCOUNTER — Encounter (HOSPITAL_COMMUNITY): Payer: Self-pay

## 2019-01-22 DIAGNOSIS — G35 Multiple sclerosis: Secondary | ICD-10-CM | POA: Diagnosis not present

## 2019-01-22 MED ORDER — AMLODIPINE BESYLATE 5 MG PO TABS: 5.0000 mg | ORAL_TABLET | Freq: Every day | ORAL | 1 refills | Status: DC

## 2019-01-22 MED ORDER — DIPHENHYDRAMINE HCL 50 MG/ML IJ SOLN
25.0000 mg | Freq: Once | INTRAMUSCULAR | Status: AC
  Administered 2019-01-22: 25 mg via INTRAVENOUS
  Filled 2019-01-22: qty 1

## 2019-01-22 MED ORDER — ACETAMINOPHEN 500 MG PO TABS
1000.0000 mg | ORAL_TABLET | Freq: Once | ORAL | Status: AC
  Administered 2019-01-22: 1000 mg via ORAL
  Filled 2019-01-22: qty 2

## 2019-01-22 MED ORDER — METHYLPREDNISOLONE SODIUM SUCC 125 MG IJ SOLR
125.0000 mg | Freq: Once | INTRAMUSCULAR | Status: AC
  Administered 2019-01-22: 125 mg via INTRAVENOUS
  Filled 2019-01-22: qty 2

## 2019-01-22 MED ORDER — SODIUM CHLORIDE 0.9 % IV SOLN
INTRAVENOUS | Status: DC
  Administered 2019-01-22: 08:00:00 via INTRAVENOUS

## 2019-01-22 MED ORDER — SODIUM CHLORIDE 0.9 % IV SOLN
1000.0000 mg | Freq: Once | INTRAVENOUS | Status: AC
  Administered 2019-01-22: 09:00:00 1000 mg via INTRAVENOUS
  Filled 2019-01-22: qty 100

## 2019-01-22 NOTE — Telephone Encounter (Signed)
Patient aware rx for amlodipine sent to pharmacy.

## 2019-01-22 NOTE — Telephone Encounter (Signed)

## 2019-01-22 NOTE — Discharge Instructions (Signed)
Rituximab injection What is this medicine? RITUXIMAB (ri TUX i mab) is a monoclonal antibody. It is used to treat certain types of cancer like non-Hodgkin lymphoma and chronic lymphocytic leukemia. It is also used to treat rheumatoid arthritis, granulomatosis with polyangiitis (or Wegener's granulomatosis), microscopic polyangiitis, and pemphigus vulgaris. This medicine may be used for other purposes; ask your health care provider or pharmacist if you have questions. COMMON BRAND NAME(S): Rituxan, RUXIENCE What should I tell my health care provider before I take this medicine? They need to know if you have any of these conditions:  heart disease  infection (especially a virus infection such as hepatitis B, chickenpox, cold sores, or herpes)  immune system problems  irregular heartbeat  kidney disease  low blood counts, like low white cell, platelet, or red cell counts  lung or breathing disease, like asthma  recently received or scheduled to receive a vaccine  an unusual or allergic reaction to rituximab, other medicines, foods, dyes, or preservatives  pregnant or trying to get pregnant  breast-feeding How should I use this medicine? This medicine is for infusion into a vein. It is administered in a hospital or clinic by a specially trained health care professional. A special MedGuide will be given to you by the pharmacist with each prescription and refill. Be sure to read this information carefully each time. Talk to your pediatrician regarding the use of this medicine in children. This medicine is not approved for use in children. Overdosage: If you think you have taken too much of this medicine contact a poison control center or emergency room at once. NOTE: This medicine is only for you. Do not share this medicine with others. What if I miss a dose? It is important not to miss a dose. Call your doctor or health care professional if you are unable to keep an appointment. What  may interact with this medicine?  cisplatin  live virus vaccines This list may not describe all possible interactions. Give your health care provider a list of all the medicines, herbs, non-prescription drugs, or dietary supplements you use. Also tell them if you smoke, drink alcohol, or use illegal drugs. Some items may interact with your medicine. What should I watch for while using this medicine? Your condition will be monitored carefully while you are receiving this medicine. You may need blood work done while you are taking this medicine. This medicine can cause serious allergic reactions. To reduce your risk you may need to take medicine before treatment with this medicine. Take your medicine as directed. In some patients, this medicine may cause a serious brain infection that may cause death. If you have any problems seeing, thinking, speaking, walking, or standing, tell your healthcare professional right away. If you cannot reach your healthcare professional, urgently seek other source of medical care. Call your doctor or health care professional for advice if you get a fever, chills or sore throat, or other symptoms of a cold or flu. Do not treat yourself. This drug decreases your body's ability to fight infections. Try to avoid being around people who are sick. Do not become pregnant while taking this medicine or for at least 12 months after stopping it. Women should inform their doctor if they wish to become pregnant or think they might be pregnant. There is a potential for serious side effects to an unborn child. Talk to your health care professional or pharmacist for more information. Do not breast-feed an infant while taking this medicine or for at   least 6 months after stopping it. What side effects may I notice from receiving this medicine? Side effects that you should report to your doctor or health care professional as soon as possible:  allergic reactions like skin rash, itching or  hives; swelling of the face, lips, or tongue  breathing problems  chest pain  changes in vision  diarrhea  headache with fever, neck stiffness, sensitivity to light, nausea, or confusion  fast, irregular heartbeat  loss of memory  low blood counts - this medicine may decrease the number of white blood cells, red blood cells and platelets. You may be at increased risk for infections and bleeding.  mouth sores  problems with balance, talking, or walking  redness, blistering, peeling or loosening of the skin, including inside the mouth  signs of infection - fever or chills, cough, sore throat, pain or difficulty passing urine  signs and symptoms of kidney injury like trouble passing urine or change in the amount of urine  signs and symptoms of liver injury like dark yellow or brown urine; general ill feeling or flu-like symptoms; light-colored stools; loss of appetite; nausea; right upper belly pain; unusually weak or tired; yellowing of the eyes or skin  signs and symptoms of low blood pressure like dizziness; feeling faint or lightheaded, falls; unusually weak or tired  stomach pain  swelling of the ankles, feet, hands  unusual bleeding or bruising  vomiting Side effects that usually do not require medical attention (report to your doctor or health care professional if they continue or are bothersome):  headache  joint pain  muscle cramps or muscle pain  nausea  tiredness This list may not describe all possible side effects. Call your doctor for medical advice about side effects. You may report side effects to FDA at 1-800-FDA-1088. Where should I keep my medicine? This drug is given in a hospital or clinic and will not be stored at home. NOTE: This sheet is a summary. It may not cover all possible information. If you have questions about this medicine, talk to your doctor, pharmacist, or health care provider.  2020 Elsevier/Gold Standard (2018-07-25  22:01:36)  

## 2019-01-23 ENCOUNTER — Ambulatory Visit (AMBULATORY_SURGERY_CENTER): Payer: Medicare Other | Admitting: Gastroenterology

## 2019-01-23 ENCOUNTER — Encounter: Payer: Self-pay | Admitting: Gastroenterology

## 2019-01-23 VITALS — BP 152/90 | HR 57 | Temp 98.9°F | Resp 11 | Ht 67.0 in | Wt 134.0 lb

## 2019-01-23 DIAGNOSIS — K259 Gastric ulcer, unspecified as acute or chronic, without hemorrhage or perforation: Secondary | ICD-10-CM

## 2019-01-23 DIAGNOSIS — R1310 Dysphagia, unspecified: Secondary | ICD-10-CM

## 2019-01-23 DIAGNOSIS — K2970 Gastritis, unspecified, without bleeding: Secondary | ICD-10-CM

## 2019-01-23 DIAGNOSIS — K3189 Other diseases of stomach and duodenum: Secondary | ICD-10-CM | POA: Diagnosis not present

## 2019-01-23 DIAGNOSIS — K449 Diaphragmatic hernia without obstruction or gangrene: Secondary | ICD-10-CM

## 2019-01-23 DIAGNOSIS — K219 Gastro-esophageal reflux disease without esophagitis: Secondary | ICD-10-CM

## 2019-01-23 MED ORDER — SODIUM CHLORIDE 0.9 % IV SOLN: 500.0000 mL | Freq: Once | INTRAVENOUS | Status: DC

## 2019-01-23 MED ORDER — OMEPRAZOLE 40 MG PO CPDR: 40.0000 mg | DELAYED_RELEASE_CAPSULE | Freq: Every day | ORAL | 1 refills | Status: DC

## 2019-01-23 NOTE — Progress Notes (Signed)
Report given to PACU, vss 

## 2019-01-23 NOTE — Progress Notes (Signed)
Called to room to assist during endoscopic procedure.  Patient ID and intended procedure confirmed with present staff. Received instructions for my participation in the procedure from the performing physician.  

## 2019-01-23 NOTE — Progress Notes (Signed)
Pt's states no medical or surgical changes since previsit or office visit.  June Bullock took temp and Courtney Washington took vitals. 

## 2019-01-23 NOTE — Patient Instructions (Signed)
Please follow Dilation Diet today. Continue present medications. Begin Omeprazole 40 mg once daily. Await pathology results.     YOU HAD AN ENDOSCOPIC PROCEDURE TODAY AT [facility]:   Refer to the procedure report that was given to you for any specific questions about what was found during the examination.  If the procedure report does not answer your questions, please call your gastroenterologist to clarify.  If you requested that your care partner not be given the details of your procedure findings, then the procedure report has been included in a sealed envelope for you to review at your convenience later.  YOU SHOULD EXPECT: Some feelings of bloating in the abdomen. Passage of more gas than usual.  Walking can help get rid of the air that was put into your GI tract during the procedure and reduce the bloating. If you had a lower endoscopy (such as a colonoscopy or flexible sigmoidoscopy) you may notice spotting of blood in your stool or on the toilet paper. If you underwent a bowel prep for your procedure, you may not have a normal bowel movement for a few days.  Please Note:  You might notice some irritation and congestion in your nose or some drainage.  This is from the oxygen used during your procedure.  There is no need for concern and it should clear up in a day or so.  SYMPTOMS TO REPORT IMMEDIATELY:     Following upper endoscopy (EGD)  Vomiting of blood or coffee ground material  New chest pain or pain under the shoulder blades  Painful or persistently difficult swallowing  New shortness of breath  Fever of 100F or higher  Black, tarry-looking stools  For urgent or emergent issues, a gastroenterologist can be reached at any hour by calling [phone].   DIET:  Drink plenty of fluids but you should avoid alcoholic beverages for 24 hours.  ACTIVITY:  You should plan to take it easy for the rest of today and you should NOT DRIVE or use heavy machinery  until tomorrow (because of the sedation medicines used during the test).    FOLLOW UP: Our staff will call the number listed on your records 48-72 hours following your procedure to check on you and address any questions or concerns that you may have regarding the information given to you following your procedure. If we do not reach you, we will leave a message.  We will attempt to reach you two times.  During this call, we will ask if you have developed any symptoms of COVID 19. If you develop any symptoms (ie: fever, flu-like symptoms, shortness of breath, cough etc.) before then, please call [phone].  If you test positive for Covid 19 in the 2 weeks post procedure, please call and report this information to [place].    If any biopsies were taken you will be contacted by phone or by letter within the next 1-3 weeks.  Please call us at [phone] if you have not heard about the biopsies in 3 weeks.    SIGNATURES/CONFIDENTIALITY: You and/or your care partner have signed paperwork which will be entered into your electronic medical record.  These signatures attest to the fact that that the information above on your After Visit Summary has been reviewed and is understood.  Full responsibility of the confidentiality of this discharge information lies with you and/or your care-partner.

## 2019-01-23 NOTE — Op Note (Signed)
[name] Procedure Date: 01/23/2019 8:56 AM MRN: [id number] Endoscopist: Remo Lipps P. Havery Moros , MD Age: 63 Referring MD:  Date of Birth: Aug 29, 1955 Gender: Female Account #: [id number] Procedure:                Upper GI endoscopy Indications:              Epigastric abdominal pain, Dysphagia, Follow-up of                            gastro-esophageal reflux disease - trial of                            protonix previously was not tolerated (made her                            feel worse), somewhat better on pepcid but using                            PRN, also using carafate PRN Medicines:                Monitored Anesthesia Care Procedure:                Pre-Anesthesia Assessment:                           - Prior to the procedure, a History and Physical                            was performed, and patient medications and                            allergies were reviewed. The patient's tolerance of                            previous anesthesia was also reviewed. The risks                            and benefits of the procedure and the sedation                            options and risks were discussed with the patient.                            All questions were answered, and informed consent                            was obtained. Prior Anticoagulants: The patient has                            taken no previous anticoagulant or antiplatelet                            agents. ASA Grade Assessment: III - A patient with  severe systemic disease. After reviewing the risks                            and benefits, the patient was deemed in                            satisfactory condition to undergo the procedure.                           After obtaining informed consent, the endoscope was                            passed under direct vision. Throughout the                            procedure, the patient's  blood pressure, pulse, and                            oxygen saturations were monitored continuously. The                            Endoscope was introduced through the mouth, and                            advanced to the second part of duodenum. The upper                            GI endoscopy was accomplished without difficulty.                            The patient tolerated the procedure well. Scope In: Scope Out: Findings:                 Esophagogastric landmarks were identified: the                            Z-line was found at 39 cm, the gastroesophageal                            junction was found at 39 cm and the upper extent of                            the gastric folds was found at 40 cm from the                            incisors.                           A 1 cm hiatal hernia was present.                           The exam of the esophagus was otherwise normal. No  stenosis / stricture appreciated.                           A guidewire was placed and the scope was withdrawn.                            Empiric dilation was performed in the entire                            esophagus with a Savary dilator with mild                            resistance at 17 mm. [place]ook endoscopy showed no                            mucosal wrents. Biopsies were taken with a cold                            forceps in the upper third of the esophagus, in the                            middle third of the esophagus and in the lower                            third of the esophagus for histology.                           One non-bleeding superficial clean based gastric                            ulcer with no stigmata of bleeding was found at the                            pylorus. The lesion was 3 mm in largest dimension.                           Patchy moderate inflammation characterized by                            erosions, erythema and friability was  found in the                            gastric antrum.                           The exam of the stomach was otherwise normal.                           Biopsies were taken with a cold forceps in the                            gastric body, at the incisura and in the gastric  antrum for Helicobacter pylori testing.                           The duodenal bulb and second portion of the                            duodenum were normal. Complications:            No immediate complications. Estimated blood loss:                            Minimal. Estimated Blood Loss:     Estimated blood loss was minimal. Impression:               - Esophagogastric landmarks identified.                           - 1 cm hiatal hernia.                           - Normal esophagus otherwise - empiric dilation                            performed to 5mm and biopsies taken to rule out                            EoE.                           - Non-bleeding gastric ulcer with no stigmata of                            bleeding.                           - Gastritis - biopsies taken to rule out H pylori                           - Normal duodenal bulb and second portion of the                            duodenum. Recommendation:           - Patient has a contact number available for                            emergencies. The signs and symptoms of potential                            delayed complications were discussed with the                            patient. Return to normal activities tomorrow.                            Written discharge instructions were provided to the  patient.                           - Resume previous diet.                           - Continue present medications.                           - Trial of another PPI if tolerated and patient                            agreeable, would try omeprazole 40mg  once daily                            - continue Pepcid 20mg  BID every day if not willing                            to try omeprazole, continue Carafate PRN                           - Await pathology results. Remo Lipps P. Havery Moros, MD 01/23/2019 9:23:37 AM This report has been signed electronically.

## 2019-01-25 ENCOUNTER — Telehealth: Payer: Self-pay

## 2019-01-25 ENCOUNTER — Other Ambulatory Visit: Payer: Self-pay | Admitting: Gastroenterology

## 2019-01-25 NOTE — Telephone Encounter (Signed)
  Follow up Call-  Call back number 01/23/2019 04/17/2017  Post procedure Call Back phone  # [phone]  Permission to leave phone message Yes Yes  Some recent data might be hidden     Patient questions:  Do you have a fever, pain , or abdominal swelling? No. Pain Score  0 *  Have you tolerated food without any problems? Yes.    Have you been able to return to your normal activities? No.  Do you have any questions about your discharge instructions: Diet   No. Medications  No. Follow up visit  No.  Do you have questions or concerns about your Care? No.  Actions: * If pain score is 4 or above: No action needed, pain <4.  1. Have you developed a fever since your procedure? no  2.   Have you had an respiratory symptoms (SOB or cough) since your procedure? no  3.   Have you tested positive for COVID 19 since your procedure no  4.   Have you had any family members/close contacts diagnosed with the COVID 19 since your procedure?  no   If yes to any of these questions please route to Joylene John, RN and Alphonsa Gin, Therapist, sports.

## 2019-01-28 ENCOUNTER — Telehealth: Payer: Self-pay | Admitting: Gastroenterology

## 2019-01-28 NOTE — Telephone Encounter (Signed)
Patient called with c/o being constipated. She had tried dulcolax with no results, then finally had to digitally disimpact herself. States she got some  relief after that. I suggested she take Miralax, and that she could take 1-3 doses a day until her stools returned to normal

## 2019-01-28 NOTE — Telephone Encounter (Signed)
Pt requested a call back to discuss her BMs.

## 2019-01-30 ENCOUNTER — Telehealth: Payer: Self-pay | Admitting: Internal Medicine

## 2019-01-30 NOTE — Telephone Encounter (Signed)
Pt called back in regards to getting PFT scheduling. Routing to [employer].

## 2019-01-30 NOTE — Telephone Encounter (Signed)
See other encounter from today that has been started by [facility] in regards to pt's PFT. Closing this encounter.

## 2019-01-31 ENCOUNTER — Ambulatory Visit (INDEPENDENT_AMBULATORY_CARE_PROVIDER_SITE_OTHER): Payer: Medicare Other | Admitting: Internal Medicine

## 2019-01-31 ENCOUNTER — Telehealth: Payer: Self-pay | Admitting: Internal Medicine

## 2019-01-31 ENCOUNTER — Other Ambulatory Visit: Payer: Self-pay

## 2019-01-31 DIAGNOSIS — I10 Essential (primary) hypertension: Secondary | ICD-10-CM

## 2019-01-31 DIAGNOSIS — K219 Gastro-esophageal reflux disease without esophagitis: Secondary | ICD-10-CM | POA: Diagnosis not present

## 2019-01-31 DIAGNOSIS — E785 Hyperlipidemia, unspecified: Secondary | ICD-10-CM | POA: Diagnosis not present

## 2019-01-31 DIAGNOSIS — M5030 Other cervical disc degeneration, unspecified cervical region: Secondary | ICD-10-CM | POA: Diagnosis not present

## 2019-01-31 DIAGNOSIS — F332 Major depressive disorder, recurrent severe without psychotic features: Secondary | ICD-10-CM

## 2019-01-31 DIAGNOSIS — Z79899 Other long term (current) drug therapy: Secondary | ICD-10-CM

## 2019-01-31 DIAGNOSIS — Z79891 Long term (current) use of opiate analgesic: Secondary | ICD-10-CM

## 2019-01-31 NOTE — Progress Notes (Signed)
CC: HTN, DDD, hyperlipidemia, GERD with   HPI:  [name] is a 63 y.o. with history of MS, DDD throughout the spine, HTN, HLD, GERD who presents to establish care for management of her chronic medical conditions.   Ms. Angel Maddox is accompanied by her husband today. Live in [facility] and are transitioning primary care from [place].  Patient is overall doing well. She had a hospitalization last month for seizure-like activity. 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 Since discharge, she has done her best to avoid stress which was believed to have precipitated the non-epileptic event.  She does not have any acute concerns today.      Past Medical History:  Diagnosis Date  . Anxiety   . Arthritis    osteo  . Bipolar 1 disorder ([place])   . Cervicalgia   . Chronic kidney disease    kidney stone  . COPD (chronic obstructive pulmonary disease) ([place])    new diagnosis- now sees pulmonologist  . Depression   . Elevated liver enzymes    She says "normal now"  . GERD (gastroesophageal reflux disease)   . H/O hiatal hernia   . Headache   . Hip fracture ([place]) 2017   left  . History of kidney stones    4-5 yrs ago  . Hypertension   . Multiple sclerosis ([place])     Had for 15 years  . Multiple sclerosis ([place])    dx 1999  . Tremors of nervous system    Past Surgical History:  Procedure Laterality Date  . ANTERIOR CERVICAL DECOMP/DISCECTOMY FUSION N/A 07/28/2017   Procedure: CERVICAL FOUR-FIVE, CERVICAL FIVE-SIX ANTERIOR CERVICAL DECOMPRESSION/DISCECTOMY FUSION;  Surgeon: Eustace Moore, MD;  Location: [facility];  Service: Neurosurgery;  Laterality: N/A;  . COLONOSCOPY  2008   Dr.Kaplan  . EYE SURGERY     [facility]  . HIP PINNING,CANNULATED Left 11/09/2015   Procedure: CANNULATED HIP PINNING;  Surgeon: Rod Can, MD;  Location: [facility];  Service: Orthopedics;  Laterality: Left;  . LIPOMA EXCISION    . TUBAL LIGATION      Family History  Problem Relation Age of Onset  . Heart attack Mother   . Cancer Maternal Aunt   . Colon cancer Neg Hx   . Stomach cancer Neg Hx   . Esophageal cancer Neg Hx   . Rectal cancer Neg Hx    Social: Patient lives with her husband in [place]. Regarding functional status, she is able to ambulate short distances on her own with a walker, performs most ADLs on her own. She previously smoked tobacco but quit over 25 years ago. No EtOH or illicit drug use.     Review of Systems:   Constitutional: negative for fevers, chills, weight loss HEENT: negative for vision changes Cardio: negative for chest pain or palpitations Resp: negative for cough, shortness of breath  GI: negative for abdominal pain, changes in bowel movements GU: negative for dysuria or hematuria  Psych: negative for sleep disturbance   Physical Exam:  Vitals:   01/31/19 1017 01/31/19 1132  BP: (!) 156/107 (!) 160/111  Pulse: 74 68  Temp: 98.2 F (36.8 C)   TempSrc: Oral   SpO2: 99%   Weight: 130 lb (59 kg)   Height: 5\' 7"  (1.702 m)    General: alert, well-appearing female in NAD HEENT: PEERL, conjunctiva normal  Neck: supple. No thyromegaly or cervical LAD CV: RRR; no m/r/g Pulm: normal work of breathing; lungs  CTAB  Abd: BS+; abdomen soft, non-tender, non-distended Neuro: Cranial nerves II-XII grossly intact; 5/5 strength in BUE; 5/5 strength in RLE, 4/5 in LLE  Psych: appropriate mood and affect   Assessment & Plan:   See Encounters Tab for problem based charting.  Patient discussed with Dr. Evette Doffing

## 2019-01-31 NOTE — Telephone Encounter (Signed)
Scheduled pt for pft on 04/29/2019-pr

## 2019-01-31 NOTE — Patient Instructions (Signed)
Ms. Burgener, It was great meeting you, and I look forward to serving as your primary care physician.   I would encourage you to reach out to your psychiatrist regarding you elevated depression screening. You may want to try a medication such as Cymbalta that could potentially have a dual pain and anti-depressant effect.   Your blood pressure is elevated at today's visit. I won't make any changes today based on one reading, but if you notice persistently elevated numbers when your physical therapist checks it we will likely need to increase your medication regimen.   I will plan to see you back in 3 months to discuss health maintenance screenings you may be due for and chronic pain medication management.   Please call beforehand if you have any questions or concerns.   Take care, Dr. Koleen Distance

## 2019-02-01 ENCOUNTER — Encounter: Payer: Self-pay | Admitting: Internal Medicine

## 2019-02-01 NOTE — Assessment & Plan Note (Signed)
Patient's PHQ-9 is 18 today. She has been established with a psychiatrist and counselor for several years after her son died from an accidental overdose.  Currently on Celexa 20 mg and Wellbutrin 100 mg BID. Given her elevated depression screening, encouraged her to follow-up with psychiatrist regarding need for possible medication adjustment. Advised that she may want to consider Cymbalta to have dual benefit of pain relief and anti-depressant effect.

## 2019-02-01 NOTE — Assessment & Plan Note (Addendum)
Patient is on Amlodipine 5 mg. Blood pressure elevated at today's visit. She is asymptomatic.  She has her blood pressure taken several times a week by in-home physical therapist prior to sessions and states she has had normal readings.  Will not increase regimen today, but advised her to continue monitoring at home. If remains persistently elevated will likely need to adjust regimen.

## 2019-02-01 NOTE — Assessment & Plan Note (Signed)
Patient is on Atorvastatin 40 mg for primary prevention based on lipid panel upon done during a hospitalization she had in June.  Will continue current therapy and repeat lipid panel in a year.

## 2019-02-01 NOTE — Telephone Encounter (Signed)
Pt was able to be contacted by Sharl Ma and has been scheduled for PFT. Nothing further needed.

## 2019-02-01 NOTE — Assessment & Plan Note (Signed)
Patient s/p cervical decompression and discectomy in 2019. She has disease throughout spine, but was told further surgical intervention is not an ideal option.  She manages pain with Oxycodone 10 mg as needed. She states she usually takes medication 2-3x per week. Controlled substance database reviewed and 120 pills have lasted her from [place].  Discussed plan for pain contract at next visit and expectations for chronic pain management.

## 2019-02-04 ENCOUNTER — Ambulatory Visit (INDEPENDENT_AMBULATORY_CARE_PROVIDER_SITE_OTHER): Payer: Medicare Other | Admitting: Psychiatry

## 2019-02-04 ENCOUNTER — Other Ambulatory Visit: Payer: Self-pay

## 2019-02-04 ENCOUNTER — Other Ambulatory Visit (INDEPENDENT_AMBULATORY_CARE_PROVIDER_SITE_OTHER): Payer: Medicare Other

## 2019-02-04 ENCOUNTER — Telehealth: Payer: Self-pay

## 2019-02-04 DIAGNOSIS — R748 Abnormal levels of other serum enzymes: Secondary | ICD-10-CM

## 2019-02-04 DIAGNOSIS — F411 Generalized anxiety disorder: Secondary | ICD-10-CM | POA: Diagnosis not present

## 2019-02-04 LAB — HEPATIC FUNCTION PANEL
ALT: 19 U/L (ref 0–35)
AST: 30 U/L (ref 0–37)
Albumin: 4.4 g/dL (ref 3.5–5.2)
Alkaline Phosphatase: 153 U/L — ABNORMAL HIGH (ref 39–117)
Bilirubin, Direct: 0 mg/dL (ref 0.0–0.3)
Total Bilirubin: 0.2 mg/dL (ref 0.2–1.2)
Total Protein: 7 g/dL (ref 6.0–8.3)

## 2019-02-04 NOTE — Telephone Encounter (Signed)
-----   Message from Hughie Closs, RN sent at 11/07/2018  4:06 PM EDT ----- Call patient and have her come in for LFT- 3 monthf/u=02/06/19

## 2019-02-04 NOTE — Progress Notes (Signed)
Internal Medicine Clinic Attending  Case discussed with Dr. Bloomfield at the time of the visit.  We reviewed the resident's history and exam and pertinent patient test results.  I agree with the assessment, diagnosis, and plan of care documented in the resident's note.  

## 2019-02-04 NOTE — Addendum Note (Signed)
Addended by: Lalla Brothers T on: 02/04/2019 08:00 AM   Modules accepted: Level of Service

## 2019-02-04 NOTE — Telephone Encounter (Signed)
Called patient and asked her to come into our lab for 3  Month F/U  LFTs. Patient will come in today

## 2019-02-04 NOTE — Progress Notes (Signed)
[facility] Counselor/Therapist Progress Note  Patient ID: Angel Maddox, MRN: [id number],    Date: 02/04/2019  Time Spent: 60 minutes  1:00pm to 2:00pm  Treatment Type: Individual Therapy    *Any new medication changes or health concerns since last appt: Since last appt, patient reports she had an endoscopy and after " having symptoms of nausea, feeling sick on my stomach, stomach burning and was told she has an ulcer and gastritis."  States she already has a hiatal hernia.  Reported Symptoms:   Anxiety and "worries" easily, anger, hurt,  Loneliness, communication issues with husband have improved "a little bit", emotionally struggles with her MS   Mental Status Exam:  Appearance:   casual  Behavior:  Appropriate and Sharing  Motor:  Normal  (except symptoms related to her MS)  Speech/Language:   Normal Rate  Affect:  Anxious, frustrated  Mood:  Anxiety and some depressed mood, less irritable today, sad  Thought process:  normal  Thought content:    WNL  Sensory/Perceptual disturbances:    WNL  Orientation:  oriented to person, place, time/date, situation, day of week, month of year and year  Attention:  Fair  Concentration:  Fair  Memory:  Fair, per patient  Fund of knowledge:   Good  Insight:    Fair  Judgment:   Good  Impulse Control:  Good   Risk Assessment: Danger to Self:  No Self-injurious Behavior: No Danger to Others: No Duty to Warn:no Physical Aggression / Violence:No  Access to Firearms a concern: No  Gang Involvement:No   Subjective:   Patient in today reporting symptoms listed above.  Is struggling emotionally as well as physically with her MS, I can walk ok with my walker but without it, I do a lot of stumbling.  Talked about her frustration, fears, frustration with symptoms. Is still having some breathing issues that she was seen for on July 7th and to have some eventual testing done per pulmonologist Dr. Keturah Barre. To return to Dr. Annamaria Boots for  testing in November (delayed due to virus pandemic). Is using a nebulizer and an inhaler in the meantime-"I had to use the inhaler this a.m. and it helped.  Thinks her MS may be affecting her breathing as well.  Is frustrated, anxious, and depressed as she talks today about how hard it is for her emotionally in coping with her MS and also family situations that are problematic. Some communication issues with other grandmother of her grandchildren and also her husband's oldest daughter. Continued concerns and fears about "how I'm going to be with my MS in the future", which patient processed in more detail again today. States that she really doesn't have people that will just let her talk through her concerns in privacy and really be able to listen to patient.  Seemed to have some relief from just talking it out some again today. I did ask her about issues related to her parents that she mentioned towards end of a prior session, but did not feel the need to follow up on it today. Feels "some of those things are resolved" but promised she would bring up the issues again if she felt the need to do so in the future.  Denied any SI.   We reviewed strategies that can help her cope emotionally more effectively with her physical issues as well better managing the days that are more challenging for her.    (Reviewed with patient as she finds  it helpful) Encouraged her to practie good self-care (physically and emotionally) as she copes with the MS as well as some marital concerns, and also the whole coronavirus (fear of illness and isolation) with patient being at risk due to her other health issues. For now her focus is to stay on her meds as prescribed and continue working  to change her view from looking for what may go right versus wrong and also practice some better communication skills with husband, which we discussed today.      Interventions: Solution-focused  and Cognitive Behavioral therapy  Diagnosis:    ICD-10-CM   1. Generalized anxiety disorder  F41.1     Plan:  Patient to work with some CBT strategies mentioned above as well as other strategies we've discussed to help with anxiety, depression, communicating with husband, and situations that need re-framing.  Goal review with patient.  Next session in 1-2 wks.  Angel Ace, LCSW

## 2019-02-05 ENCOUNTER — Other Ambulatory Visit: Payer: Self-pay

## 2019-02-05 ENCOUNTER — Ambulatory Visit: Payer: Medicare Other | Admitting: Physician Assistant

## 2019-02-05 ENCOUNTER — Ambulatory Visit (HOSPITAL_COMMUNITY)
Admission: RE | Admit: 2019-02-05 | Discharge: 2019-02-05 | Disposition: A | Discharge status: Home / Self Care | Payer: Medicare Other | Source: Ambulatory Visit | Attending: Internal Medicine | Admitting: Internal Medicine

## 2019-02-05 ENCOUNTER — Encounter (HOSPITAL_COMMUNITY): Payer: Self-pay

## 2019-02-05 DIAGNOSIS — G35 Multiple sclerosis: Secondary | ICD-10-CM | POA: Diagnosis not present

## 2019-02-05 MED ORDER — SODIUM CHLORIDE 0.9 % IV SOLN
INTRAVENOUS | Status: DC
  Administered 2019-02-05: 10:00:00 via INTRAVENOUS

## 2019-02-05 MED ORDER — ACETAMINOPHEN 500 MG PO TABS
1000.0000 mg | ORAL_TABLET | Freq: Once | ORAL | Status: AC
  Administered 2019-02-05: 1000 mg via ORAL
  Filled 2019-02-05: qty 2

## 2019-02-05 MED ORDER — DIPHENHYDRAMINE HCL 50 MG/ML IJ SOLN
25.0000 mg | Freq: Once | INTRAMUSCULAR | Status: AC
  Administered 2019-02-05: 25 mg via INTRAVENOUS
  Filled 2019-02-05: qty 1

## 2019-02-05 MED ORDER — SODIUM CHLORIDE 0.9 % IV SOLN
1000.0000 mg | Freq: Once | INTRAVENOUS | Status: AC
  Administered 2019-02-05: 1000 mg via INTRAVENOUS
  Filled 2019-02-05: qty 100

## 2019-02-05 MED ORDER — CLONIDINE HCL 0.1 MG PO TABS
0.1000 mg | ORAL_TABLET | Freq: Once | ORAL | Status: AC
  Administered 2019-02-05: 0.1 mg via ORAL
  Filled 2019-02-05: qty 1

## 2019-02-05 MED ORDER — METHYLPREDNISOLONE SODIUM SUCC 125 MG IJ SOLR
125.0000 mg | Freq: Once | INTRAMUSCULAR | Status: AC
  Administered 2019-02-05: 10:00:00 125 mg via INTRAVENOUS
  Filled 2019-02-05: qty 2

## 2019-02-05 NOTE — Discharge Instructions (Signed)
Per MD, Please follow up with your Primary Care provider regarding your high blood pressure as soon as possible. Rituximab injection What is this medicine? RITUXIMAB (ri TUX i mab) is a monoclonal antibody. It is used to treat certain types of cancer like non-Hodgkin lymphoma and chronic lymphocytic leukemia. It is also used to treat rheumatoid arthritis, granulomatosis with polyangiitis (or Wegener's granulomatosis), microscopic polyangiitis, and pemphigus vulgaris. This medicine may be used for other purposes; ask your health care provider or pharmacist if you have questions. COMMON BRAND NAME(S): Rituxan, RUXIENCE What should I tell my health care provider before I take this medicine? They need to know if you have any of these conditions:  heart disease  infection (especially a virus infection such as hepatitis B, chickenpox, cold sores, or herpes)  immune system problems  irregular heartbeat  kidney disease  low blood counts, like low white cell, platelet, or red cell counts  lung or breathing disease, like asthma  recently received or scheduled to receive a vaccine  an unusual or allergic reaction to rituximab, other medicines, foods, dyes, or preservatives  pregnant or trying to get pregnant  breast-feeding How should I use this medicine? This medicine is for infusion into a vein. It is administered in a hospital or clinic by a specially trained health care professional. A special MedGuide will be given to you by the pharmacist with each prescription and refill. Be sure to read this information carefully each time. Talk to your pediatrician regarding the use of this medicine in children. This medicine is not approved for use in children. Overdosage: If you think you have taken too much of this medicine contact a poison control center or emergency room at once. NOTE: This medicine is only for you. Do not share this medicine with others. What if I miss a dose? It is  important not to miss a dose. Call your doctor or health care professional if you are unable to keep an appointment. What may interact with this medicine?  cisplatin  live virus vaccines This list may not describe all possible interactions. Give your health care provider a list of all the medicines, herbs, non-prescription drugs, or dietary supplements you use. Also tell them if you smoke, drink alcohol, or use illegal drugs. Some items may interact with your medicine. What should I watch for while using this medicine? Your condition will be monitored carefully while you are receiving this medicine. You may need blood work done while you are taking this medicine. This medicine can cause serious allergic reactions. To reduce your risk you may need to take medicine before treatment with this medicine. Take your medicine as directed. In some patients, this medicine may cause a serious brain infection that may cause death. If you have any problems seeing, thinking, speaking, walking, or standing, tell your healthcare professional right away. If you cannot reach your healthcare professional, urgently seek other source of medical care. Call your doctor or health care professional for advice if you get a fever, chills or sore throat, or other symptoms of a cold or flu. Do not treat yourself. This drug decreases your body's ability to fight infections. Try to avoid being around people who are sick. Do not become pregnant while taking this medicine or for at least 12 months after stopping it. Women should inform their doctor if they wish to become pregnant or think they might be pregnant. There is a potential for serious side effects to an unborn child. Talk to your health  care professional or pharmacist for more information. Do not breast-feed an infant while taking this medicine or for at least 6 months after stopping it. What side effects may I notice from receiving this medicine? Side effects that you  should report to your doctor or health care professional as soon as possible:  allergic reactions like skin rash, itching or hives; swelling of the face, lips, or tongue  breathing problems  chest pain  changes in vision  diarrhea  headache with fever, neck stiffness, sensitivity to light, nausea, or confusion  fast, irregular heartbeat  loss of memory  low blood counts - this medicine may decrease the number of white blood cells, red blood cells and platelets. You may be at increased risk for infections and bleeding.  mouth sores  problems with balance, talking, or walking  redness, blistering, peeling or loosening of the skin, including inside the mouth  signs of infection - fever or chills, cough, sore throat, pain or difficulty passing urine  signs and symptoms of kidney injury like trouble passing urine or change in the amount of urine  signs and symptoms of liver injury like dark yellow or brown urine; general ill feeling or flu-like symptoms; light-colored stools; loss of appetite; nausea; right upper belly pain; unusually weak or tired; yellowing of the eyes or skin  signs and symptoms of low blood pressure like dizziness; feeling faint or lightheaded, falls; unusually weak or tired  stomach pain  swelling of the ankles, feet, hands  unusual bleeding or bruising  vomiting Side effects that usually do not require medical attention (report to your doctor or health care professional if they continue or are bothersome):  headache  joint pain  muscle cramps or muscle pain  nausea  tiredness This list may not describe all possible side effects. Call your doctor for medical advice about side effects. You may report side effects to FDA at 1-800-FDA-1088. Where should I keep my medicine? This drug is given in a hospital or clinic and will not be stored at home. NOTE: This sheet is a summary. It may not cover all possible information. If you have questions about  this medicine, talk to your doctor, pharmacist, or health care provider.  2020 Elsevier/Gold Standard (2018-07-25 22:01:36)

## 2019-02-05 NOTE — Progress Notes (Signed)
After receiving clonidine per Dr Jacqulynn Cadet 's order for hypertension,  Pt tolerated infusion well and verbalized understanding of following up with PCP for HTN asap.

## 2019-02-06 ENCOUNTER — Telehealth: Payer: Self-pay

## 2019-02-06 NOTE — Telephone Encounter (Signed)
Requesting to speak with a nurse about bp and meds. Please call pt back.

## 2019-02-06 NOTE — Telephone Encounter (Signed)
rtc to pt, she states her BP was elevated at her injection appt. She was given clonidine to lower her bp. She related the visit recently w/ dr bloomfield and her bp and also the visit with op to get her injection. She was ask if she has a bp monitor at home, she states yes. It was suggested by nurse to check her bp appr 2 x daily, at different times and different activities, at rest, up moving about etc. Record the bp and what she was doing at the time. Keep this for a week and call back. Also advised if she has severe h/a, short of breath, chest pain, vision and speech changes-blur, slur. Weakness, numbness of limbs, N&V to call 911. Advised to call clinic with bp readings and for questions.  Please advise

## 2019-02-07 NOTE — Telephone Encounter (Signed)
I agree with advice and plan. Thank you!

## 2019-02-18 ENCOUNTER — Ambulatory Visit (INDEPENDENT_AMBULATORY_CARE_PROVIDER_SITE_OTHER): Payer: Medicare Other | Admitting: Psychiatry

## 2019-02-18 ENCOUNTER — Other Ambulatory Visit: Payer: Self-pay

## 2019-02-18 DIAGNOSIS — F411 Generalized anxiety disorder: Secondary | ICD-10-CM

## 2019-02-18 NOTE — Progress Notes (Signed)
[facility] Counselor/Therapist Progress Note  Patient ID: Angel Maddox, MRN: [id number],    Date: 02/18/2019  Time Spent: 60 minutes  1:00pm to 2:00pm  Treatment Type: Individual Therapy    *Any new medication changes or health concerns since last appt: Patient state she has been"started on Prilosec for her ulcer and gastritis."  Reported Symptoms:   Anxiety and "worries" often, anger, hurt, communication issues with husband (has improved some), emotionally struggles with her MS    Mental Status Exam:  Appearance:   casual  Behavior:  Appropriate and Sharing  Motor:  Normal  (except symptoms related to her MS)  Speech/Language:   Normal Rate  Affect:  Anxious, frustrated  Mood:  Anxiety and some depressed mood, less irritable today, sad  Thought process:  normal  Thought content:    WNL  Sensory/Perceptual disturbances:    WNL  Orientation:  oriented to person, place, time/date, situation, day of week, month of year and year  Attention:  Fair  Concentration:  Fair  Memory:  Fair, per patient  Fund of knowledge:   Good  Insight:    Fair  Judgment:   Good  Impulse Control:  Good   Risk Assessment: Danger to Self:  No Self-injurious Behavior: No Danger to Others: No Duty to Warn:no Physical Aggression / Violence:No  Access to Firearms a concern: No  Gang Involvement:No   Subjective:    Patient in today reporting symptoms listed above.  "More emotional lately due to it being the birthday of her deceased son Angel Maddox. Talked through her sadness and still missing him, and how her grandkids (47 2 young children) are a source of comfort for patient.     Is struggling emotionally as well as physically with her MS, she can walk ok with her walker but without it, she has a  lot of stumbling.  Talked more about her frustration, fears, frustration with symptoms. Her breathing problems have continued and is still scheduled in November for testing per pulmonologist Dr.  Keturah Barre. Difficulty continues in coping with her MS, especially when symptoms are heightened. What makes it more difficult, is the family issues, and the grief over loss of son, Angel Maddox. We focus on unresolved grief in the midst of our sessions, and patient is experiencing some healing.   Reviewed strategies with patient that can help her cope more effectively with her health concerns better, and manage the days that are challenging for her.  **On a positive note, patient and husband are making some progress in their communication and in their relationship.  (Reviewed with patient as she finds it helpful) Encouraged her to practie good self-care (physically and emotionally) as she copes with the MS as well as some marital concerns, and also the whole coronavirus (fear of illness and isolation) with patient being at risk due to her other health issues. For now her focus is to stay on her meds as prescribed and continue working  to change her view from looking for what may go right versus what might go wrong,  and also continue practicing some better communication skills with husband.    Interventions: Solution-focused, Grief Therapy,  and Cognitive Behavioral therapy  Diagnosis:   ICD-10-CM   1. Generalized anxiety disorder  F41.1     Plan:   Patient to work with some CBT strategies mentioned above as well as other strategies we've discussed to help with anxiety, depression, communicating with husband, and situations that need re-framing.  Working with some grief  therapy and some CBT with some of patient's unresolved grief.  Goal review, per Flowsheets and progress noted with patient.  Next session in 1-2 wks.  [name], LCSW

## 2019-02-19 ENCOUNTER — Ambulatory Visit: Payer: Self-pay | Admitting: Internal Medicine

## 2019-02-20 ENCOUNTER — Other Ambulatory Visit: Payer: Self-pay | Admitting: Physician Assistant

## 2019-02-20 ENCOUNTER — Telehealth: Payer: Self-pay | Admitting: Physician Assistant

## 2019-02-20 ENCOUNTER — Ambulatory Visit: Payer: Medicare Other | Admitting: Internal Medicine

## 2019-02-20 MED ORDER — CLONAZEPAM 0.5 MG PO TABS: 0.2500 mg | ORAL_TABLET | Freq: Two times a day (BID) | ORAL | 0 refills | Status: DC | PRN

## 2019-02-20 NOTE — Telephone Encounter (Signed)
I'll send in enough for 2 per day.  And I want her husband to dispense them to her.  Let me know the pharm and I'll send in.  Thanks

## 2019-02-20 NOTE — Progress Notes (Signed)
See phone msg.  In med list, it shows Klonopin 0.25mg  per Dr. Marylu Lund, but in [place], not filled.  Last was by me.  I am increasing the dose to what she and I have been doing, and qty.

## 2019-02-20 NOTE — Telephone Encounter (Signed)
Pt called upset. Stated needs more than 1 Klonapin a day. She is under a lot of stress right now. Please advise. Call Pt @ [phone]

## 2019-02-25 ENCOUNTER — Ambulatory Visit: Payer: Medicare Other | Admitting: Psychiatry

## 2019-03-13 ENCOUNTER — Ambulatory Visit: Payer: Medicare Other | Admitting: Physician Assistant

## 2019-03-17 ENCOUNTER — Other Ambulatory Visit: Payer: Self-pay | Admitting: Gastroenterology

## 2019-03-18 ENCOUNTER — Ambulatory Visit: Payer: Medicare Other | Admitting: Physician Assistant

## 2019-03-19 ENCOUNTER — Other Ambulatory Visit: Payer: Self-pay

## 2019-03-19 ENCOUNTER — Ambulatory Visit (INDEPENDENT_AMBULATORY_CARE_PROVIDER_SITE_OTHER): Payer: Medicare Other | Admitting: Psychiatry

## 2019-03-19 DIAGNOSIS — F411 Generalized anxiety disorder: Secondary | ICD-10-CM

## 2019-03-19 MED ORDER — CLONAZEPAM 0.5 MG PO TABS: 0.2500 mg | ORAL_TABLET | Freq: Two times a day (BID) | ORAL | 0 refills | Status: DC | PRN

## 2019-03-19 NOTE — Progress Notes (Signed)
[facility] Counselor/Therapist Progress Note  Patient ID: Angel Maddox, MRN: [id number],    Date: 03/19/2019   Time Spent: 60 minutes    11:00am to 12:00noon  Treatment Type: Individual Therapy  Virtual Visit with Video Note: *Was able to make a video connection with patient however it was very difficult for patient to manage and have privacy as she is in midst of another bad flare-up with her MS symptoms and is in bed.  Connected with patient by a video enabled telemedicine/telehealth application or telephone, with their informed consent, and verified patient privacy and that I am speaking with the correct person using two identifiers. I discussed the limitations, risks, security and privacy concerns of performing psychotherapy and management service by telephone and the availability of in person appointments. I also discussed with the patient that there may be a patient responsible charge related to this service. The patient expressed understanding and agreed to proceed. I discussed the treatment planning with the patient. The patient was provided an opportunity to ask questions and all were answered. The patient agreed with the plan and demonstrated an understanding of the instructions. The patient was advised to call  our office if  symptoms worsen or feel they are in a crisis state and need immediate contact.   Therapist Location: [facility] Patient Location: home   Reported Symptoms:  anxiety, some depression, frustration, increasing physical issues with her MS and has worsened lately  ( is in contact with her neurologist)  Mental Status Exam:  Appearance:   Casual; in bed due to bad flare-up with MS symptoms  Behavior:  Appropriate and Sharing  Motor:  having to stay in bed today  Speech/Language:   Normal Rate  Affect:  Depressed and sadness, anxiety  Mood:  anxious, depressed and sad  Thought process:  normal  Thought content:    WNL  Sensory/Perceptual  disturbances:    WNL  Orientation:  oriented to person, place, time/date, situation, day of week, month of year and year  Attention:  Good  Concentration:  Good  Memory:  recent memory issues of forgetting names  Fund of knowledge:   Good  Insight:    Good  Judgment:   Good  Impulse Control:  Good   Risk Assessment: Danger to Self:  No Self-injurious Behavior: No Danger to Others: No Duty to Warn:no Physical Aggression / Violence:No  Access to Firearms a concern: No  Gang Involvement:No   Subjective:  Patient having a hard time today with her MS symptoms being worse and she's in bed all day today.  Anxious, some depression, frustration.  Continues to have difficult family situations and interactions and able to process some of these today. Difficult time for her due to MS symptoms and also dealing with all her emotional concerns.  Denies any SI.   Interventions: Cognitive Behavioral Therapy and Grief Therapy  Diagnosis:   ICD-10-CM   1. Generalized anxiety disorder  F41.1     Plan:  Patient not signing tx plan updates on computer screen due to [place].    Treatment Goals: Goals may remain on tx plan as patient works on strategies to meet her goals.  Progress will be documented each session in "Progress" section. Updating goal plan today.  Long term goal: Develop the ability to recognize, accept, and cope with feelings of depression.  Short term goal: Learn and implement calming skills to reduce overall tension and moments of increased anxiety and/or depression. Patient will recognize  her negative self-talk, interrupt it, and not let it prevent her from trying new strategies to help herself.  Strategy: Teach patient cognitive and somatic calming skills .  Rehearse with patient how to apply these skills to her daily life.  Review and reinforce success while providing corrective feedback toward consistent implementation.  Progress: Treatment plan was updated today to better  meet patient needs.  We talked about tx plan updates and patient is to work on [place] which we discussed today including mindfulness exercises and slow deep breathing exercises (with her counting) during times she feels anxious, stressed. Patient notes that "it will be hard for me to catch the anxiety to stop it."  Looked at how she could be more aware of triggers to her anxiety and also practice calming strategies proactively, which may also serve as a preventive measure for her.                                                                                                                             Next appt within 2-3 wks.                                                                                                                                                                                                       [name], LCSW

## 2019-03-21 ENCOUNTER — Telehealth: Payer: Self-pay | Admitting: Physician Assistant

## 2019-03-21 NOTE — Telephone Encounter (Signed)
Pt requesting a refill on her Klonopin 0.5 mg .Need filled at the [facility] in [place]. Appt RS to 10/20 due to provider availability.

## 2019-03-21 NOTE — Telephone Encounter (Signed)
Rx for klonopin submitted on 03/19/2019 by [name], pt needs to contact pharmacy

## 2019-03-22 ENCOUNTER — Ambulatory Visit: Payer: Medicare Other | Admitting: Physician Assistant

## 2019-03-22 ENCOUNTER — Encounter (HOSPITAL_COMMUNITY): Payer: Self-pay

## 2019-03-29 ENCOUNTER — Encounter (HOSPITAL_COMMUNITY): Payer: Self-pay

## 2019-04-01 ENCOUNTER — Ambulatory Visit: Payer: Medicare Other | Admitting: Psychiatry

## 2019-04-01 ENCOUNTER — Telehealth: Payer: Self-pay | Admitting: *Deleted

## 2019-04-01 NOTE — Telephone Encounter (Signed)
Dr bloomfield, pt states she has been increased on her infusions for ms due to flare, she states it is driving her insane, she cannot get through to md office at [facility], she needs 1 mg of clonazepam 3 times daily, you may call her at [phone]

## 2019-04-02 NOTE — Telephone Encounter (Signed)
Hi Dr. Koleen Distance, yes, she's getting Klonopin 0.5 mg bid.  I'm completely fine w/ her having a higher dose or increased frequency.  Even both if needed.  She has had a problem w/ overtaking in the past though, after her son died when she was having a really hard time, just so you know.  Her husband is very supportive and I've asked him to dispense her meds in the past when that became an issue.  That worked well. Angel Maddox is a very nice lady and I appreciate you helping her!

## 2019-04-02 NOTE — Telephone Encounter (Signed)
Thank you, Angel Maddox! I've sent a message to [name] to get her thoughts on Klonopin prescription and who will continue to prescribe so she won't run into issues with multiple prescribers. Hopefully neurologist office will have additional recommendations regarding increased steroids.

## 2019-04-02 NOTE — Telephone Encounter (Signed)
[facility]N called on her visit today to pt she states BP was elevated last taken 148/94. Pt was anxious, tearful and states the hi dose steroids are getting the best of her, states she is not suicidal or thinking of harm to others. Pt's spouse is with her and assists her with medications and care. He states this is worse than last increase in steroids. [facility] is calling neurologist office in [place] that has provided ms care for many years. Pt also has appt 10/8 with therapist at [facility].

## 2019-04-03 ENCOUNTER — Other Ambulatory Visit: Payer: Self-pay | Admitting: Physician Assistant

## 2019-04-03 ENCOUNTER — Telehealth: Payer: Self-pay | Admitting: Physician Assistant

## 2019-04-03 MED ORDER — CLONAZEPAM 0.5 MG PO TABS: 0.5000 mg | ORAL_TABLET | Freq: Three times a day (TID) | ORAL | 0 refills | Status: DC | PRN

## 2019-04-03 NOTE — Telephone Encounter (Signed)
See phone call note

## 2019-04-04 ENCOUNTER — Ambulatory Visit (INDEPENDENT_AMBULATORY_CARE_PROVIDER_SITE_OTHER): Payer: Medicare Other | Admitting: Psychiatry

## 2019-04-04 ENCOUNTER — Other Ambulatory Visit: Payer: Self-pay

## 2019-04-04 DIAGNOSIS — F411 Generalized anxiety disorder: Secondary | ICD-10-CM

## 2019-04-04 NOTE — Progress Notes (Signed)
[facility] Counselor/Therapist Progress Note  Patient ID: Angel Maddox, MRN: [id number],    Date: 04/04/2019   Time Spent: 60 minutes   12:00noon to 1:00pm   Treatment Type: Individual Therapy  Reported Symptoms:  Anxiety, worries a lot  Mental Status Exam:  Appearance:   Neat     Behavior:  Appropriate and Sharing  Motor:  Normal  Speech/Language:   Normal Rate  Affect:  Depressed and anxious  Mood:  anxious  Thought process:  slowed some she says due to medication  Thought content:    WNL  Sensory/Perceptual disturbances:    WNL  Orientation:  oriented to person, place, time/date, situation, day of week, month of year and year  Attention:  Fair  Concentration:  Fair  Memory:  patient denies any problem but does add  she says she does feel "a little foggy-headed" at times, "often when I'm upset"  Fund of knowledge:   Good  Insight:    Fair  Judgment:   Fair  Impulse Control:  Fair   Risk Assessment: Danger to Self:  No Self-injurious Behavior: No Danger to Others: No Duty to Warn:no Physical Aggression / Violence:No  Access to Firearms a concern: No  Gang Involvement:No   Subjective:  Patient in today "with anxiety mostly"  Some irritability with husband, and general negativity which she did a good job in talking through a lot today in session before we could get to her goals. Focusing heavily on negative thoughts.  Did do her homework in writing out specific things that stress her or hurt her.  She didn't really want to review that but did talk through negativity as noted above and was related also to her homework assignment.    Interventions: Solution-Oriented/Positive Psychology and Ego-Supportive  Diagnosis:   ICD-10-CM   1. Generalized anxiety disorder  F41.1      Plan:  Patient not signing tx plan updates on computer screen due to [place].    Treatment Goals: Goals may remain on tx plan as patient works on strategies to meet her goals.  Progress  will be documented each session in "Progress" section.  Long term goal: Develop the ability to recognize, accept, and cope with feelings of depression.  Short term goal: Learn and implement calming skills to reduce overall tension and moments of increased anxiety and/or depression. Patient will recognize her negative self-talk, interrupt it, and not let it prevent her from trying new strategies to help herself.  Strategy: Teach patient cognitive and somatic calming skills .  Rehearse with patient how to apply these skills to her daily life.  Review and reinforce success while providing corrective feedback toward consistent implementation.  PROGRESS: Patient and I worked today, following up on all the negativity mentioned above in note, and specifically her negative thoughts and how they impact her feelings in negative ways.  She did identify that a lot of the negativity she experiences is within relationships and interactions with other people. Hard for patient to focus and remain focused on working to change her negative thoughts.  Tried to frame it in a way for her to better understand how changing her thoughts can change the way she feels, and gave an example to help her realize the benefit for her.  Was able to focus some on this and agreed to try again in another session.  She was more receptive to talking about automatic thoughts and seemed to relate that she does experience these, which led  to [place] talking about her trying to interrupt negative automatic thoughts and replace with thoughts that are more positive and self-affirming. Will follow up on this next session with patient.  Goal review and progress noted.   Next appt within 2-3 weeks.   [name], LCSW

## 2019-04-04 NOTE — Telephone Encounter (Signed)
Ok, that's fine.  But she'll have the extra if she needs it.  Thanks.

## 2019-04-11 ENCOUNTER — Other Ambulatory Visit: Payer: Self-pay | Admitting: Physician Assistant

## 2019-04-15 ENCOUNTER — Other Ambulatory Visit: Payer: Self-pay

## 2019-04-15 ENCOUNTER — Ambulatory Visit (INDEPENDENT_AMBULATORY_CARE_PROVIDER_SITE_OTHER): Payer: Medicare Other | Admitting: Psychiatry

## 2019-04-15 DIAGNOSIS — F411 Generalized anxiety disorder: Secondary | ICD-10-CM | POA: Diagnosis not present

## 2019-04-15 NOTE — Progress Notes (Signed)
[facility] Counselor/Therapist Progress Note  Patient ID: Angel Maddox, MRN: [id number],    Date: 04/15/2019  Time Spent: 60 minutes   12:00noon to 1:00pm  Treatment Type: Individual Therapy  Reported Symptoms: anxiety, depression, sadness (elderly aunt died this week); issues going on with other set of grandparents creating a lot of anger and resentment.  Having increased MS symptoms and today could hardly make it back to my office even with assistance.  Mental Status Exam:  Appearance:   Neat     Behavior:  Appropriate and Sharing  Motor:  Tremors due to MS  Speech/Language:   Normal Rate  Affect:  Depressed and anxious  Mood:  anxious and depressed  Thought process:  goal directed  Thought content:    WNL  Sensory/Perceptual disturbances:    WNL  Orientation:  oriented to person, place, time/date, situation, day of week, month of year and year  Attention:  Good  Concentration:  Good  Memory:  Patient reports recent memory issues and forgetting names, things previously said  Fund of knowledge:   Good  Insight:    Fair  Judgment:   Good  Impulse Control:  Good   Risk Assessment: Danger to Self:  No Self-injurious Behavior: No Danger to Others: No Duty to Warn:no Physical Aggression / Violence:No  Access to Firearms a concern: No  Gang Involvement:No   Subjective:  Patient in today and reports anxiety, depression, and some sadness (due to elderly aunt's death this week).  Also very upset over recent conflicts with other set of grandparents involving young grandchildren.  Very angry and bitter.  Interventions: Solution-Oriented/Positive Psychology and Ego-Supportive  Diagnosis:   ICD-10-CM   1. Generalized anxiety disorder  F41.1      Plan: Patient not signing tx plan updates on computer screen due to [place].   Treatment Goals: Goals may remain on tx plan as patient works on strategies to meet her goals. Progress will be documented each session in  "Progress" section.  Long term goal: Develop the ability to recognize, accept, and cope with feelings of depression.  Short term goal: Learn and implement calming skills to reduce overall tension and moments of increased anxiety and/or depression.Patient will recognize her negative self-talk, interrupt it, and not let it prevent her from trying new strategies to help herself.  Strategy: Teach patient cognitive and somatic calming skills . Rehearse with patient how to apply these skills to her daily life. Review and reinforce success while providing corrective feedback toward consistent implementation.  PROGRESS: Patient has not been able to make much progress on her goals stated above due to grief over death of her elderly aunt this week, very noticeable worsening of her MS symptoms, and recent conflict with other grandparents of her grandchildren. Patient openly talked about her feelings of sadness re: aunt's death and processed it's impact on her.  Also very angry and upset about the other set of grandparents of her grandchildren---"makes me feel less than".  Said when she was so angry the other day (last week, doesn't know which day) that she felt she was mad enough to hurt someone and we discussed this is detail to make sure she was not having thoughts and intentions of hurting anyone or herself.  After talking, patient continued to agree that she would not do anything to hurt anyone else nor herself.  Did admit her earlier comment was impulsive and stated that she was frustrated and angry but would not hurt self  nor anyone else.  Was much calmer and seemed to feel heard today.  Did a good job of talking through her grief and her anger today.  States she is feeling better here at end of session--"feel like I got my anger and aggression out and it was building up with so much happening." Will pick back up on her short and long term goals next session.    Next session within 2-3  weeks and to  call If needed before that time.   [name], LCSW

## 2019-04-16 ENCOUNTER — Ambulatory Visit (INDEPENDENT_AMBULATORY_CARE_PROVIDER_SITE_OTHER): Payer: Medicare Other | Admitting: Physician Assistant

## 2019-04-16 ENCOUNTER — Other Ambulatory Visit: Payer: Self-pay

## 2019-04-16 ENCOUNTER — Encounter: Payer: Self-pay | Admitting: Physician Assistant

## 2019-04-16 DIAGNOSIS — F331 Major depressive disorder, recurrent, moderate: Secondary | ICD-10-CM

## 2019-04-16 DIAGNOSIS — F411 Generalized anxiety disorder: Secondary | ICD-10-CM

## 2019-04-16 DIAGNOSIS — G35 Multiple sclerosis: Secondary | ICD-10-CM | POA: Diagnosis not present

## 2019-04-16 DIAGNOSIS — G4700 Insomnia, unspecified: Secondary | ICD-10-CM | POA: Diagnosis not present

## 2019-04-16 MED ORDER — CITALOPRAM HYDROBROMIDE 40 MG PO TABS: 40.0000 mg | ORAL_TABLET | Freq: Every day | ORAL | 5 refills | Status: DC

## 2019-04-16 NOTE — Progress Notes (Signed)
[facility] Med Check  Patient ID: Angel Maddox,  MRN: [id number]  PCP: Delice Bison, DO  Date of Evaluation: 04/16/2019 Time spent:15 minutes  Chief Complaint:  Chief Complaint    Anxiety; Depression; Follow-up     Virtual Visit via Telephone Note  I connected with patient by a video enabled telemedicine application or telephone, with their informed consent, and verified patient privacy and that I am speaking with the correct person using two identifiers.  I am private, in my office and the patient is home.  I discussed the limitations, risks, security and privacy concerns of performing an evaluation and management service by telephone and the availability of in person appointments. I also discussed with the patient that there may be a patient responsible charge related to this service. The patient expressed understanding and agreed to proceed.   I discussed the assessment and treatment plan with the patient. The patient was provided an opportunity to ask questions and all were answered. The patient agreed with the plan and demonstrated an understanding of the instructions.   The patient was advised to call back or seek an in-person evaluation if the symptoms worsen or if the condition fails to improve as anticipated.  I provided 15 minutes of non-face-to-face time during this encounter.  HISTORY/CURRENT STATUS: HPI For med check.  Since LOV, has had MS relapse. Had to be put on high dose steroids which helped initially.  "I was mean, and irritable. I'm better now w/ that, but the MS makes me depressed.  But I think that's to be expected." Because of increased anxiety and irritability w/ the steroids, Dr. Tomi Likens gave her a higher dose of Klonopin for a little while.  Pt's PCP had contacted me, asking if we could either increase the quantity of the 0.5 mg or increase the dosage to 1 mg.  I sent in a prescription for the 0.5 mg with higher quantity.  In the meantime, Dr.  Tomi Likens had already given her a prescription.  Patient states she takes half of the pill if the anxiety is not too bad or if it is really bad she will go ahead and take 1 mg which is helpful.  Still having trouble walking d/t MS.  She was in office yesterday to see [name], LCSW, and I saw her, walking spastically, w/ her walker.  Due to her physical restrictions, energy and motivation are low.  States she does get depressed thinking about the MS.  "I do not want to go on any more antidepressants though.  We had to increase the Celexa back up and it is helping some."  Denies suicidal or homicidal thoughts.  Sleeps fairly well.  Takes a Klonopin every evening.  She is walking with a walker or using her motorized wheelchair at home all the time right now.  Also having muscle spasms due to the MS.  Individual Medical History/ Review of Systems: Changes? :Yes worsening of MS.  Past medications for mental health diagnoses include: Depakote, Lamictal, Risperdal, Celexa, Klonopin, trazodone, Wellbutrin, Vistaril, primidone, gabapentin, Prozac, Zyprexa, Ambien, Lunesta, Effexor  Allergies: Hydrocodone  Current Medications:  Current Outpatient Medications:  .  amLODipine (NORVASC) 5 MG tablet, TAKE 1 TABLET BY MOUTH EVERY DAY, Disp: 90 tablet, Rfl: 0 .  buPROPion (WELLBUTRIN SR) 200 MG 12 hr tablet, Take 1 tablet (200 mg total) by mouth 2 (two) times daily. (Patient taking differently: Take 200 mg by mouth daily. ), Disp: 60 tablet, Rfl: 5 .  clonazePAM (KLONOPIN)  1 MG tablet, Take 1 mg by mouth 3 (three) times daily as needed., Disp: , Rfl:  .  mirtazapine (REMERON) 15 MG tablet, Take 0.5-1 tablets (7.5-15 mg total) by mouth at bedtime as needed. (Patient taking differently: Take 7.5-15 mg by mouth at bedtime as needed (sleep). ), Disp: 30 tablet, Rfl: 5 .  Nebulizers (COMPRESSOR/NEBULIZER) MISC, Use as directed, Disp: 1 each, Rfl: 0 .  omeprazole (PRILOSEC) 40 MG capsule, Take 1 capsule (40 mg  total) by mouth daily., Disp: 90 capsule, Rfl: 1 .  ondansetron (ZOFRAN-ODT) 4 MG disintegrating tablet, TAKE 1 TABLET BY MOUTH EVERY 8 HOURS AS NEEDED FOR NAUSEA AND VOMITING, Disp: 30 tablet, Rfl: 2 .  Oxycodone HCl 10 MG TABS, Take 1 tablet (10 mg total) by mouth every 6 (six) hours as needed ((score 7 to 10))., Disp: 120 tablet, Rfl: 0 .  primidone (MYSOLINE) 250 MG tablet, Take 250 mg by mouth 3 (three) times daily. , Disp: , Rfl:  .  PROAIR HFA 108 (90 Base) MCG/ACT inhaler, INHALE 2 PUFFS INTO THE LUNGS EVERY 4 (FOUR) HOURS AS NEEDED FOR WHEEZING OR SHORTNESS OF BREATH., Disp: 8.5 Inhaler, Rfl: 1 .  pseudoephedrine-acetaminophen (TYLENOL SINUS) 30-500 MG TABS tablet, Take 1 tablet by mouth daily as needed (pain)., Disp: , Rfl:  .  riTUXimab (RITUXAN) 100 MG/10ML injection, Inject into the vein., Disp: , Rfl:  .  sucralfate (CARAFATE) 1 GM/10ML suspension, TAKE 10 MLS (1 G TOTAL) BY MOUTH EVERY 8 (EIGHT) HOURS AS NEEDED., Disp: 420 mL, Rfl: 3 .  tizanidine (ZANAFLEX) 2 MG capsule, Take 2 mg by mouth every 8 (eight) hours as needed for muscle spasms. , Disp: , Rfl:  .  aspirin EC 81 MG EC tablet, Take 1 tablet (81 mg total) by mouth daily. (Patient not taking: Reported on 04/16/2019), Disp: 90 tablet, Rfl: 0 .  atorvastatin (LIPITOR) 40 MG tablet, Take 1 tablet (40 mg total) by mouth daily at 6 PM. (Patient not taking: Reported on 04/16/2019), Disp: 30 tablet, Rfl: 1 .  benzonatate (TESSALON) 200 MG capsule, Take 1 capsule (200 mg total) by mouth 2 (two) times daily as needed for cough. (Patient not taking: Reported on 04/16/2019), Disp: 20 capsule, Rfl: 0 .  citalopram (CELEXA) 40 MG tablet, Take 1 tablet (40 mg total) by mouth daily., Disp: 30 tablet, Rfl: 5 .  gabapentin (NEURONTIN) 300 MG capsule, Take 2 capsules (600 mg total) by mouth 3 (three) times daily., Disp: 180 capsule, Rfl: 0 .  ipratropium-albuterol (DUONEB) 0.5-2.5 (3) MG/3ML SOLN, 1 neb every 6 hours as needed for help breathing  (Patient not taking: Reported on 04/16/2019), Disp: 75 mL, Rfl: 12 .  valACYclovir (VALTREX) 1000 MG tablet, TAKE 1 TABLET 2 TIMES DAILY. THEN STAY ON ONE TABLET DAILY AS PREVENTION (Patient not taking: Reported on 04/16/2019), Disp: 40 tablet, Rfl: 5 Medication Side Effects: none  Family Medical/ Social History: Changes? No  MENTAL HEALTH EXAM:  There were no vitals taken for this visit.There is no height or weight on file to calculate BMI.  General Appearance: unable to assess  Eye Contact:  unable to assess  Speech:  Clear and Coherent  Volume:  Normal  Mood:  Euthymic  Affect:  unable to assess  Thought Process:  Goal Directed and Descriptions of Associations: Intact  Orientation:  Full (Time, Place, and Person)  Thought Content: Logical   Suicidal Thoughts:  No  Homicidal Thoughts:  No  Memory:  WNL  Judgement:  Good  Insight:  Good  Psychomotor Activity:  unable to assess  Concentration:  Concentration: Good  Recall:  Good  Fund of Knowledge: Good  Language: Good  Assets:  Desire for Improvement  ADL's:  Intact  Cognition: WNL  Prognosis:  Good    DIAGNOSES:    ICD-10-CM   1. Major depressive disorder, recurrent episode, moderate (HCC)  F33.1   2. Generalized anxiety disorder  F41.1   3. Insomnia, unspecified type  G47.00   4. MS (multiple sclerosis) ([place])  G35     Receiving Psychotherapy: Yes  with [name], LCSW   RECOMMENDATIONS:  Continue Wellbutrin SR 200 mg 1 p.o. twice daily. Continue Celexa 40 mg daily. Continue Klonopin 1 mg 3 times daily as needed.  Recent prescription with refills per Dr. Tomi Likens. Continue mirtazapine 15 mg, 1/2-1 nightly as needed sleep. Continue psychotherapy with [name], LCSW. Return in 3 months.  Donnal Moat, PA-C

## 2019-04-24 ENCOUNTER — Other Ambulatory Visit: Payer: Self-pay | Admitting: Gastroenterology

## 2019-04-24 DIAGNOSIS — K259 Gastric ulcer, unspecified as acute or chronic, without hemorrhage or perforation: Secondary | ICD-10-CM

## 2019-04-26 ENCOUNTER — Other Ambulatory Visit (HOSPITAL_COMMUNITY)
Admission: RE | Admit: 2019-04-26 | Discharge: 2019-04-26 | Disposition: A | Discharge status: Home / Self Care | Payer: Medicare Other | Source: Ambulatory Visit | Attending: Internal Medicine | Admitting: Internal Medicine

## 2019-04-26 ENCOUNTER — Ambulatory Visit: Payer: Medicare Other | Admitting: Psychiatry

## 2019-04-26 DIAGNOSIS — Z20828 Contact with and (suspected) exposure to other viral communicable diseases: Secondary | ICD-10-CM | POA: Diagnosis not present

## 2019-04-26 DIAGNOSIS — Z01812 Encounter for preprocedural laboratory examination: Secondary | ICD-10-CM | POA: Insufficient documentation

## 2019-04-27 LAB — NOVEL CORONAVIRUS, NAA (HOSP ORDER, SEND-OUT TO REF LAB; TAT 18-24 HRS): SARS-CoV-2, NAA: NOT DETECTED

## 2019-04-29 ENCOUNTER — Other Ambulatory Visit: Payer: Self-pay

## 2019-04-29 ENCOUNTER — Ambulatory Visit (INDEPENDENT_AMBULATORY_CARE_PROVIDER_SITE_OTHER): Payer: Medicare Other | Admitting: Internal Medicine

## 2019-04-29 ENCOUNTER — Encounter: Payer: Self-pay | Admitting: Internal Medicine

## 2019-04-29 ENCOUNTER — Telehealth: Payer: Self-pay | Admitting: Internal Medicine

## 2019-04-29 ENCOUNTER — Ambulatory Visit: Payer: Medicare Other | Admitting: Internal Medicine

## 2019-04-29 DIAGNOSIS — J449 Chronic obstructive pulmonary disease, unspecified: Secondary | ICD-10-CM

## 2019-04-29 DIAGNOSIS — R0600 Dyspnea, unspecified: Secondary | ICD-10-CM | POA: Diagnosis not present

## 2019-04-29 DIAGNOSIS — E871 Hypo-osmolality and hyponatremia: Secondary | ICD-10-CM | POA: Diagnosis not present

## 2019-04-29 DIAGNOSIS — G35 Multiple sclerosis: Secondary | ICD-10-CM

## 2019-04-29 DIAGNOSIS — R0609 Other forms of dyspnea: Secondary | ICD-10-CM

## 2019-04-29 LAB — PULMONARY FUNCTION TEST
DL/VA % pred: 78 %
DL/VA: 3.21 ml/min/mmHg/L
DLCO unc % pred: 79 %
DLCO unc: 17.5 ml/min/mmHg
FEF 25-75 Post: 2.87 L/sec
FEF 25-75 Pre: 3.14 L/sec
FEF2575-%Change-Post: -8 %
FEF2575-%Pred-Post: 119 %
FEF2575-%Pred-Pre: 130 %
FEV1-%Change-Post: -2 %
FEV1-%Pred-Post: 108 %
FEV1-%Pred-Pre: 111 %
FEV1-Post: 3 L
FEV1-Pre: 3.08 L
FEV1FVC-%Change-Post: 1 %
FEV1FVC-%Pred-Pre: 104 %
FEV6-%Change-Post: -4 %
FEV6-%Pred-Post: 104 %
FEV6-%Pred-Pre: 109 %
FEV6-Post: 3.61 L
FEV6-Pre: 3.8 L
FEV6FVC-%Pred-Post: 103 %
FEV6FVC-%Pred-Pre: 103 %
FVC-%Change-Post: -4 %
FVC-%Pred-Post: 100 %
FVC-%Pred-Pre: 105 %
FVC-Post: 3.63 L
FVC-Pre: 3.8 L
Post FEV1/FVC ratio: 83 %
Post FEV6/FVC ratio: 100 %
Pre FEV1/FVC ratio: 81 %
Pre FEV6/FVC Ratio: 100 %
RV % pred: 109 %
RV: 2.4 L
TLC % pred: 108 %
TLC: 5.98 L

## 2019-04-29 MED ORDER — BREO ELLIPTA 100-25 MCG/INH IN AEPB: INHALATION_SPRAY | RESPIRATORY_TRACT | 12 refills | Status: DC

## 2019-04-29 MED ORDER — BREO ELLIPTA 100-25 MCG/INH IN AEPB: 1.0000 | INHALATION_SPRAY | Freq: Every day | RESPIRATORY_TRACT | 0 refills | Status: DC

## 2019-04-29 NOTE — Progress Notes (Signed)
Full PFT performed today. °

## 2019-04-29 NOTE — Progress Notes (Signed)
HPI  63 yo F former smoker followed for asthma/ COPD, dyspnea, complicated by Multiple Sclerosis,  GERD, Chronic constipation,  Osteoporosis, Depression/ Anxiety, Malnutrition, Hyponatremia, abnormal LFTs ECHO 12/20/18- EF 60-65%, WNL PFT 04/29/2019- WNL except DLCO minimally reduced. FV loops show vocal tremor ----------------------------------------------------------------------------  01/01/2019- 82 yo F former smoker followed for asthma/ COPD, dyspnea, complicated by Multiple Sclerosis, Anxiety,  GERD, Chronic constipation,  Osteoporosis, Depression/ Anxiety, Malnutrition, Hyponatremia, abnormal LFTs Medication includes  albuterol hfa, Neb Duoneb, clonazepam, gabapentin, welbutrin, citalopram, mirtazapine, provigil, Rituxan Still pending repeat PFT (for better effort) and Echo Had televisit 5/14. -----pt states breathing has been worse, using neb 3-4 times daily; uses albuterol PRN. Feels some benefit from nebulizer for sense of chest tightness and soreness mid anterior chest. "Why can't I walk more?" Little cough or wheeze, occ sl chest rattle non productive. No palpitation or angina.  She is willing to retry PFT. Discussed limited pulmonary findings of some emphysema on CXR and some subjective response to nebulizer as c/w mild COPD. She seemed to have some trouble following simple explanation. CXR 1V- 12/19/2018- IMPRESSION: No acute abnormality. ECHO 12/20/18- EF 60-65%, WNL   04/29/2019- 61 yo F former smoker followed for asthma/ COPD, dyspnea, complicated by Multiple Sclerosis,  Anxiety,  GERD/ Gastritis, Chronic constipation,  Osteoporosis, Depression/ Anxiety, Malnutrition, Hyponatremia, abnormal LFTs Medication includes  albuterol hfa, Neb Duoneb, clonazepam, gabapentin, welbutrin, citalopram, mirtazapine, provigil, Rituxan Sodium 128 on 6/25 UTD flu vax -----4 month f/u PFT. 170/110 on arrival  [place] in July with hyponatremia and "seizure-like activity". PFT reviewed, noting vibration in  airflow c/w vocal cord tremor. She asks why she is SOB with these PFT scores. Discussed weakness.  "Lungs tighten up a lot"- might indicate asthma. Says Anoro may help a little, doesn't last all day. No wheeze. We discussed change to try Breo.  PFT 04/29/2019- WNL except DLCO minimally reduced. FV loops show vocal tremor CXR 1V- 12/19/2018-  No acute abnormality.   ROS-see HPI   + = positive Constitutional:    weight loss, night sweats, fevers, chills, fatigue, lassitude. HEENT:    headaches, difficulty swallowing, tooth/dental problems, sore throat,       sneezing, itching, ear ache, nasal congestion, post nasal drip, snoring CV:    chest pain, orthopnea, PND, swelling in lower extremities, anasarca,                                  dizziness, palpitations Resp:   +shortness of breath with exertion or at rest.                +productive cough,   +non-productive cough, coughing up of blood.              change in color of mucus.  wheezing.   Skin:    rash or lesions. GI:  No-   heartburn, indigestion, +abdominal pain, nausea, vomiting, diarrhea,                 change in bowel habits, loss of appetite GU: dysuria, change in color of urine, no urgency or frequency.   flank pain. MS:   joint pain, stiffness, decreased range of motion, back pain. Neuro-   + MS Psych:  change in mood or affect.  depression or +anxiety.   memory loss.  OBJ- Physical Exam General- Alert, Oriented, Affect-anxious, Distress- none acute, + rolling walker Skin- rash-none, lesions- none, excoriation- none Lymphadenopathy- none  Head- atraumatic            Eyes- Gross vision intact, PERRLA, conjunctivae and secretions clear            Ears- Hearing, canals-normal            Nose- Clear, no-Septal dev, mucus, polyps, erosion, perforation             Throat- Mallampati II , mucosa clear , drainage- none, tonsils- atrophic Neck- flexible , trachea midline, no stridor , thyroid nl, carotid no bruit Chest -  symmetrical excursion , unlabored           Heart/CV- RRR , no murmur , no gallop  , no rub, nl s1 s2                           - JVD- none , edema- none, stasis changes- none, varices- none           Lung- clear to P&A, wheeze- none, cough- none , dullness-none, rub- none           Chest wall-  Abd-  Br/ Gen/ Rectal- Not done, not indicated Extrem- cyanosis- none, clubbing, none, atrophy- none,  Neuro- grossly intact to observation

## 2019-04-29 NOTE — Telephone Encounter (Signed)
Patient had an OV with Dr. Annamaria Boots this morning as well as a PFT. Her BP was originally 170/110. I re-checked her BP again about 20 minutes later, it was 158/100. Patient stated that she was not in any distress. Denied any chest, arm or leg pain. She stated that she felt fine.   Advised patient to try to go home and rest and then recheck her BP again. Patient called back and stated that she was at another doctor's office and her BP was still running high at 144/100. She still stated that she felt ok, not in any distress. I spoke with CY about her BP, he stated that she needed to follow up with her PCP in regards to this. If anything changes and she begins to feel bad, she needs to report to the ED. Relayed info to patient. She verbalized understanding.   Upon reviewing her chart, it appears that she has an appointment with Dr. Koleen Distance this week.

## 2019-04-29 NOTE — Patient Instructions (Signed)
Sample and script Breo 100 inhale 1 puff then rinse mouth, once every day- maintenance Try this instead of Anoro  You can still use your albuterol rescue inhaler  2 puffs every 6 hours, if needed  Please call if we can help

## 2019-04-30 ENCOUNTER — Ambulatory Visit (INDEPENDENT_AMBULATORY_CARE_PROVIDER_SITE_OTHER): Payer: Medicare Other | Admitting: Psychiatry

## 2019-04-30 ENCOUNTER — Telehealth: Payer: Self-pay

## 2019-04-30 DIAGNOSIS — F331 Major depressive disorder, recurrent, moderate: Secondary | ICD-10-CM

## 2019-04-30 NOTE — Telephone Encounter (Signed)
rtc to pt, she states her bp was up 11/2

## 2019-04-30 NOTE — Progress Notes (Signed)
[facility] Counselor/Therapist Progress Note  Patient ID: Angel Maddox, MRN: [id number],    Date: 04/30/2019  Time Spent:  60 minutes  11:00am to 12:00noon  Virtual Visit with Video Note Connected with patient by a video enabled telemedicine/telehealth application or telephone, with their informed consent, and verified patient privacy and that I am speaking with the correct person using two identifiers. I discussed the limitations, risks, security and privacy concerns of performing psychotherapy and management service by telephone and the availability of in person appointments. I also discussed with the patient that there may be a patient responsible charge related to this service. The patient expressed understanding and agreed to proceed. I discussed the treatment planning with the patient. The patient was provided an opportunity to ask questions and all were answered. The patient agreed with the plan and demonstrated an understanding of the instructions. The patient was advised to call  our office if  symptoms worsen or feel they are in a crisis state and need immediate contact.   Therapist Location: [facility] Patient Location: home   Treatment Type: Individual Therapy  Reported Symptoms: depression, anxiety, physical health problems (being followed by her regular physicians)  Mental Status Exam:  Appearance:   Casual     Behavior:  Appropriate and Sharing  Motor:  Normal  Speech/Language:   Clear and Coherent  Affect:  anxious  Mood:  anxious, depressed and some sadness about her progression of MS diagnosis  Thought process:  goal directed  Thought content:    WNL  Sensory/Perceptual disturbances:    WNL  Orientation:  oriented to person, place, time/date, situation, day of week, month of year and year  Attention:  Fair  Concentration:  Fair  Memory:  Pt reports more memory issues and states her doctor told her that is part of her [place] of knowledge:    Good  Insight:    Good  Judgment:   Good  Impulse Control:  Fair   Risk Assessment: Danger to Self:  No Self-injurious Behavior: No Danger to Others: No Duty to Warn:no Physical Aggression / Violence:No  Access to Firearms a concern: No  Gang Involvement:No   Subjective: Patient today reporting that she is realizing more progression with her MS and that is affecting her depression some.  Talked through her thoughts and feelings today and she was thorough in talking about her concerns and fears.  Was also able to reflect on some of the tasks she can still do.  Interventions: Solution-Oriented/Positive Psychology and Ego-Supportive  Diagnosis:   ICD-10-CM   1. Major depressive disorder, recurrent episode, moderate ([place])  F33.1     Plan: Patient not signing tx plan updates on computer screen due to [place].   Treatment Goals: Goals may remain on tx plan as patient works on strategies to meet her goals. Progress will be documented each session in "Progress" section.  Long term goal: Develop the ability to recognize, accept, and cope with feelings of depression.  Short term goal: Learn and implement calming skills to reduce overall tension and moments of increased anxiety and/or depression.Patient will recognize her negative self-talk, interrupt it, and not let it prevent her from trying new strategies to help herself.  Strategy: Teach patient cognitive and somatic calming skills . Rehearse with patient how to apply these skills to her daily life. Review and reinforce success while providing corrective feedback toward consistent implementation.  PROGRESS: Patient is working to progress on her goals especially as  she better recognizes and is trying to strengthen her ability to accept and cope with depression.  We talked about her acceptance especially in session today and that seemed helpful to patient.  Her MS symptoms are reportedly progressing more and that is  understandably affecting patient's coping right now.  More weakness. She shared at length today about her physical condition and her fears, which she is trying to stay more grounded in the present and not to into the future, and admits that can be difficult.  Also expressed some anger, which was healthy for her as her outside support is limited.  Reviewed some calming skills as well as monitoring her anxious thoughts and trying to convert them to being more positive, reality-based, and self-affirming. To continue working on her goals as stated above, next session.   Next appt to be within 3 weeks.   [name], LCSW

## 2019-04-30 NOTE — Telephone Encounter (Signed)
Requesting to speak with a nurse about bp. Please call pt back.  

## 2019-04-30 NOTE — Addendum Note (Signed)
Addended byShanon Ace on: 04/30/2019 12:03 PM   Modules accepted: Level of Service

## 2019-05-01 ENCOUNTER — Other Ambulatory Visit: Payer: Self-pay

## 2019-05-01 ENCOUNTER — Encounter: Payer: Self-pay | Admitting: Physical Therapy

## 2019-05-01 ENCOUNTER — Ambulatory Visit: Payer: Medicare Other | Attending: Psychiatry | Admitting: Physical Therapy

## 2019-05-01 DIAGNOSIS — R2681 Unsteadiness on feet: Secondary | ICD-10-CM | POA: Insufficient documentation

## 2019-05-01 DIAGNOSIS — M6281 Muscle weakness (generalized): Secondary | ICD-10-CM | POA: Insufficient documentation

## 2019-05-01 DIAGNOSIS — R260 Ataxic gait: Secondary | ICD-10-CM | POA: Diagnosis present

## 2019-05-01 NOTE — Therapy (Signed)
[facility] [place], [place], [address] Phone: [phone]   Fax:  [phone]  Physical Therapy Evaluation  Patient Details  Name: Angel Maddox MRN: [id number] Date of Birth: [birth year] Referring Provider (PT): Delphia Grates, MD   Encounter Date: 05/01/2019  PT End of Session - 05/01/19 1312    Visit Number  1    Number of Visits  12    Date for PT Re-Evaluation  06/19/19    PT Start Time  1115    PT Stop Time  1205    PT Time Calculation (min)  50 min    Activity Tolerance  Patient limited by fatigue;Patient limited by pain    Behavior During Therapy  [facility] for tasks assessed/performed       Past Medical History:  Diagnosis Date  . Anxiety   . Arthritis    osteo  . Bipolar 1 disorder ([place])   . Cervicalgia   . Chronic kidney disease    kidney stone  . COPD (chronic obstructive pulmonary disease) ([place])    new diagnosis- now sees pulmonologist  . Depression   . Elevated liver enzymes    She says "normal now"  . GERD (gastroesophageal reflux disease)   . H/O hiatal hernia   . Headache   . Hip fracture ([place]) 2017   left  . History of kidney stones    4-5 yrs ago  . Hypertension   . Multiple sclerosis ([place])     Had for 15 years  . Multiple sclerosis ([place])    dx 1999  . Tremors of nervous system     Past Surgical History:  Procedure Laterality Date  . ANTERIOR CERVICAL DECOMP/DISCECTOMY FUSION N/A 07/28/2017   Procedure: CERVICAL FOUR-FIVE, CERVICAL FIVE-SIX ANTERIOR CERVICAL DECOMPRESSION/DISCECTOMY FUSION;  Surgeon: Eustace Moore, MD;  Location: [facility];  Service: Neurosurgery;  Laterality: N/A;  . COLONOSCOPY  2008   Dr.Kaplan  . EYE SURGERY     [facility]  . HIP PINNING,CANNULATED Left 11/09/2015   Procedure: CANNULATED HIP PINNING;  Surgeon: Rod Can, MD;  Location: [facility];  Service: Orthopedics;  Laterality: Left;  . LIPOMA EXCISION    . TUBAL LIGATION      There were  no vitals filed for this visit.   Subjective Assessment - 05/01/19 1233    Subjective  COVID-19 screening performed upon arrival. Patient arrives to physical therapy with reports difficulty walking, difficulty with ADLs and functional tasks that has progressively worsened due to Multiple Sclerosis. Patient reports currently being in an exacerbation phase of MS. Patient reports decrease in function and requires assistance from her husband. Patient reports independence with dressing and requires increased time and assistance for other ADLs such as sink baths. Patient is dependent on rollator for ambulation. Patient reports fatigue and pain as a primary limitation of functional activities. Patient's goals of physical therapy are to decrease pain, improve movement, improve strength, and improve ability to perform ADLs and functional tasks.    Pertinent History  MS, tremors, HTN, COPD, bipolar disorder, lumbar DDD, cervical fusion, history of right hip fracture & pinning    Limitations  Standing;Walking;House hold activities    Patient Stated Goals  improve mobility and improve strength in UEs and LEs and function    Currently in Pain?  Yes   did not provide number on pain scale   Pain Location  Back    Pain Orientation  Lower    Pain Descriptors / Indicators  Sore;Tightness    Pain Type  Chronic pain    Pain Onset  More than a month ago    Pain Frequency  Intermittent         [facility] PT Assessment - 05/01/19 0001      Assessment   Medical Diagnosis  Multiple sclerosis    Referring Provider (PT)  Delphia Grates, MD    Onset Date/Surgical Date  --   ongoing, Dx ~ 25 years ago   Next MD Visit  Late November 2020    Prior Therapy  yes      Precautions   Precautions  Fall    Precaution Comments  Ataxic gait; close supervision to min/mod A for ambulation and transfers at all times      Balance Screen   Has the patient fallen in the past 6 months  No    Has the patient had a decrease in  activity level because of a fear of falling?   Yes    Is the patient reluctant to leave their home because of a fear of falling?   No   husband always with her in [place] residence    Living Arrangements  Spouse/significant other      Prior Function   Level of Independence  Independent with basic ADLs;Needs assistance with homemaking;Needs assistance with ADLs;Needs assistance with gait      Observation/Other Assessments   Observations  increased bilateral UE tremors toward end of session      ROM / Strength   AROM / PROM / Strength  Strength      Strength   Strength Assessment Site  Hip;Knee;Shoulder    Right/Left Shoulder  Right;Left    Right Shoulder Flexion  3/5    Right Shoulder ABduction  3/5    Right Shoulder Internal Rotation  4/5    Right Shoulder External Rotation  4/5    Left Shoulder Flexion  3/5    Left Shoulder ABduction  3/5    Left Shoulder Internal Rotation  4/5    Left Shoulder External Rotation  4/5    Right/Left Hip  Right;Left    Right Hip Flexion  3/5    Left Hip Flexion  3+/5    Right/Left Knee  Right;Left    Right Knee Flexion  4/5    Right Knee Extension  4/5    Left Knee Flexion  4/5    Left Knee Extension  4/5      Transfers   Transfers  Stand to Sit;Sit to Stand;Stand Pivot Transfers    Sit to Stand  4: Min guard;5: Supervision;4: Min assist    Stand to Sit  4: Min assist;5: Supervision    Comments  increasing assistance required for all transfers toward end of session due to increased spasticity and ataxia of LEs      Ambulation/Gait   Assistive device  Rollator    Gait Pattern  Step-through pattern;Ataxic;Decreased stride length;Decreased dorsiflexion - right;Decreased dorsiflexion - left;Poor foot clearance - right;Poor foot clearance - left    Ambulation Surface  Level;Indoor    Gait velocity  slow and cautious    Gait Comments  notable ataxic gait pattern at end of session requiring  min A x2 for safety.      Standardized Balance Assessment   Standardized Balance Assessment  Five Times Sit to Stand    Five times sit to stand comments   53.9 seconds modified  with UE support and close supervision/contact guard for descent                Objective measurements completed on examination: See above findings.              PT Education - 05/01/19 1340    Education Details  draw ins, hip abduction, hip adduction, marching    Person(s) Educated  Patient    Methods  Explanation;Handout;Demonstration    Comprehension  Verbalized understanding;Returned demonstration          PT Long Term Goals - 05/01/19 1345      PT LONG TERM GOAL #1   Title  Patient will be independent with HEP.    Time  6    Period  Weeks    Status  New      PT LONG TERM GOAL #2   Title  Patient will demonstrate modified 5x sit to stand test in 45 seconds or less to decrease fall risk and improve functional LE strength.    Time  6    Period  Weeks    Status  New      PT LONG TERM GOAL #3   Title  Patient will ambulate 150 feet with rollator and close supervision to safely ambulate home.    Time  6    Period  Weeks    Status  New      PT LONG TERM GOAL #4   Title  Patient will demonstrate 4/5 or greater bilateral LE MMT to improve stablity during functional tasks.    Time  6    Period  Weeks    Status  New      PT LONG TERM GOAL #5   Title  Patient will report ability to perform ADLs with assistance with global pain less than or equal to 5/10.    Time  6    Period  Weeks    Status  New             Plan - 05/01/19 1341    Clinical Impression Statement  Patient is a 63 year old female who presents to physical therapy with difficulty walking, decreased bilateral UE and LE MMT, and ataxia due to an exacerbation of multiple sclerosis. Patient's modified 5x sit to stand time of 53.9 seconds categorizes her as a fall risk with decreased functional MMT. Patient  demonstrated decreased gait speed, decreased bilateral foot clearance, and increased UE support on rollator. At end of session, patient noted with increased ataxic gait requiring min A x2 to maintain balance and was instructed to sit for safety. Patient later reported ataxia causes her to lean backward and have near falls but reported no falls in the past 6 months.    Personal Factors and Comorbidities  Comorbidity 3+;Time since onset of injury/illness/exacerbation    Examination-Activity Limitations  Bed Mobility;Locomotion Level;Transfers;Hygiene/Grooming    Stability/Clinical Decision Making  Evolving/Moderate complexity    Clinical Decision Making  Moderate    Rehab Potential  Fair    PT Frequency  2x / week    PT Duration  6 weeks    PT Treatment/Interventions  ADLs/Self Care Home Management;Cryotherapy;Electrical Stimulation;Moist Heat;Balance training;Therapeutic exercise;Therapeutic activities;Functional mobility training;Stair training;Gait training;Neuromuscular re-education;Patient/family education;Manual techniques;Passive range of motion    PT Next Visit Plan  nustep, gentle strengthening, therapeutic activities, transfers, PROM and manual stretching for spasticity    PT Home Exercise Plan  see patient education section    Consulted and Agree with Plan  of Care  Patient       Patient will benefit from skilled therapeutic intervention in order to improve the following deficits and impairments:  Abnormal gait, Decreased activity tolerance, Decreased balance, Pain, Impaired tone, Difficulty walking, Decreased strength, Decreased mobility, Increased muscle spasms  Visit Diagnosis: Unsteadiness on feet - Plan: PT plan of care cert/re-cert  Muscle weakness (generalized) - Plan: PT plan of care cert/re-cert  Ataxic gait - Plan: PT plan of care cert/re-cert     Problem List Patient Active Problem List   Diagnosis Date Noted  . Seizure-like activity ([place]) 12/27/2018  . Dyslipidemia  12/20/2018  . Hyponatremia syndrome 07/04/2018  . DOE (dyspnea on exertion) 07/03/2018  . COPD mixed type ([place]) 07/03/2018  . MDD (major depressive disorder) 06/28/2018  . GAD (generalized anxiety disorder) 06/28/2018  . Major depressive disorder, recurrent episode, moderate ([place]) 04/17/2018  . Head injury 12/08/2017  . Cervical vertebral fusion 07/28/2017  . DDD (degenerative disc disease), cervical 07/10/2017  . DDD (degenerative disc disease), thoracic 07/10/2017  . DDD (degenerative disc disease), lumbar 07/10/2017  . Facet arthritis of cervical region 07/10/2017  . Abnormal liver function tests 03/07/2017  . Hypokalemia 03/07/2017  . Gastroesophageal reflux disease with esophagitis 03/07/2017  . Esophageal dysphagia 03/07/2017  . Chronic idiopathic constipation 02/01/2017  . Fatigue 02/01/2017  . HSV-1 infection 01/12/2017  . Osteoporosis with pathological fracture, with routine healing, subsequent encounter 08/30/2016  . MDD (major depressive disorder), recurrent episode, severe ([place]) 03/29/2016  . Estrogen deficiency 12/15/2015  . MS (multiple sclerosis) ([place]) 12/15/2015  . Tremor 12/15/2015  . Healthcare maintenance 12/15/2015  . Essential hypertension   . Protein-calorie malnutrition, severe 11/09/2015  . Bipolar 1 disorder, depressed ([place]) 02/11/2015    [name], PT, DPT 05/01/2019, 2:50 PM  [facility] [place], [place], [address] Phone: [phone]   Fax:  [phone]  Name: SHEROL CANTARERO MRN: [id number] Date of Birth: Nov 02, 1955

## 2019-05-06 ENCOUNTER — Ambulatory Visit: Payer: Medicare Other | Admitting: Internal Medicine

## 2019-05-06 ENCOUNTER — Encounter: Payer: Self-pay | Admitting: Internal Medicine

## 2019-05-06 DIAGNOSIS — E785 Hyperlipidemia, unspecified: Secondary | ICD-10-CM

## 2019-05-06 DIAGNOSIS — I10 Essential (primary) hypertension: Secondary | ICD-10-CM

## 2019-05-06 DIAGNOSIS — Z79899 Other long term (current) drug therapy: Secondary | ICD-10-CM

## 2019-05-06 DIAGNOSIS — R0981 Nasal congestion: Secondary | ICD-10-CM

## 2019-05-06 MED ORDER — ATORVASTATIN CALCIUM 40 MG PO TABS: 40.0000 mg | ORAL_TABLET | Freq: Every day | ORAL | 1 refills | Status: DC

## 2019-05-06 MED ORDER — AMLODIPINE BESYLATE 10 MG PO TABS: 10.0000 mg | ORAL_TABLET | Freq: Every day | ORAL | 2 refills | Status: DC

## 2019-05-06 MED ORDER — LISINOPRIL 10 MG PO TABS: 10.0000 mg | ORAL_TABLET | Freq: Every day | ORAL | 0 refills | Status: DC

## 2019-05-06 NOTE — Assessment & Plan Note (Signed)
Not on basis of chronic lung disease. Suspect habitual water loading.  Plan- restrict water.  Follow with her other providers.

## 2019-05-06 NOTE — Assessment & Plan Note (Signed)
Negligible COPD. Weakness probably primary reason for dyspnea. Can't exclude a mild asthma component so will try Breo inhaler.  Encourage exercise to extent she can.

## 2019-05-06 NOTE — Patient Instructions (Addendum)
Angel Maddox, It was a pleasure seeing you again.   Today we discussed your blood pressure. I am making a couple of changes today. I will increase your Amlodipine to 10 mg and start you on Lisinopril 10 mg daily. We will see you back in 4 weeks to see how your blood pressure is doing and check your kidney function.   For your nasal congestion, you can try Flonase spray 1-2x daily to see if this helps and over-the-counter allergy medication such as Zyrtec, Claritin if you want.   Take care, Dr. Koleen Distance

## 2019-05-08 ENCOUNTER — Other Ambulatory Visit: Payer: Self-pay

## 2019-05-08 ENCOUNTER — Ambulatory Visit: Payer: Medicare Other | Admitting: Physical Therapy

## 2019-05-08 ENCOUNTER — Encounter: Payer: Self-pay | Admitting: Physical Therapy

## 2019-05-08 DIAGNOSIS — R260 Ataxic gait: Secondary | ICD-10-CM

## 2019-05-08 DIAGNOSIS — M6281 Muscle weakness (generalized): Secondary | ICD-10-CM

## 2019-05-08 DIAGNOSIS — R2681 Unsteadiness on feet: Secondary | ICD-10-CM | POA: Diagnosis not present

## 2019-05-08 NOTE — Therapy (Signed)
[facility] [place], [place], [address] Phone: [phone]   Fax:  [phone]  Physical Therapy Treatment  Patient Details  Name: Angel Maddox MRN: [id number] Date of Birth: [birth year] Referring Provider (PT): Delphia Grates, MD   Encounter Date: 05/08/2019  PT End of Session - 05/08/19 0955    Visit Number  2    Number of Visits  12    Date for PT Re-Evaluation  06/19/19    PT Start Time  [id number]    PT Stop Time  [id number]   limited by rest breaks   PT Time Calculation (min)  39 min    Activity Tolerance  Patient limited by fatigue    Behavior During Therapy  [facility] for tasks assessed/performed       Past Medical History:  Diagnosis Date  . Anxiety   . Arthritis    osteo  . Bipolar 1 disorder ([place])   . Cervicalgia   . Chronic kidney disease    kidney stone  . COPD (chronic obstructive pulmonary disease) ([place])    new diagnosis- now sees pulmonologist  . Depression   . Elevated liver enzymes    She says "normal now"  . GERD (gastroesophageal reflux disease)   . H/O hiatal hernia   . Headache   . Hip fracture ([place]) 2017   left  . History of kidney stones    4-5 yrs ago  . Hypertension   . Multiple sclerosis ([place])     Had for 15 years  . Multiple sclerosis ([place])    dx 1999  . Tremors of nervous system     Past Surgical History:  Procedure Laterality Date  . ANTERIOR CERVICAL DECOMP/DISCECTOMY FUSION N/A 07/28/2017   Procedure: CERVICAL FOUR-FIVE, CERVICAL FIVE-SIX ANTERIOR CERVICAL DECOMPRESSION/DISCECTOMY FUSION;  Surgeon: Eustace Moore, MD;  Location: [facility];  Service: Neurosurgery;  Laterality: N/A;  . COLONOSCOPY  2008   Dr.Kaplan  . EYE SURGERY     [facility]  . HIP PINNING,CANNULATED Left 11/09/2015   Procedure: CANNULATED HIP PINNING;  Surgeon: Rod Can, MD;  Location: [facility];  Service: Orthopedics;  Laterality: Left;  . LIPOMA EXCISION    . TUBAL LIGATION      There were  no vitals filed for this visit.  Subjective Assessment - 05/08/19 0954    Subjective  COVID 19 screening performed on patient upon arrival. Patient reports that she has really needed therapy. Had to order new medication specifically for MS. Trying to control spasms.    Pertinent History  MS, tremors, HTN, COPD, bipolar disorder, lumbar DDD, cervical fusion, history of right hip fracture & pinning    Limitations  Standing;Walking;House hold activities    Patient Stated Goals  improve mobility and improve strength in UEs and LEs and function    Currently in Pain?  No/denies         [facility] PT Assessment - 05/08/19 0001      Assessment   Medical Diagnosis  Multiple sclerosis    Referring Provider (PT)  Delphia Grates, MD    Next MD Visit  Late November 2020    Prior Therapy  yes      Precautions   Precautions  Fall    Precaution Comments  Ataxic gait; close supervision to min/mod A for ambulation and transfers at all times                   [facility] Adult PT Treatment/Exercise - 05/08/19  0001      Exercises   Exercises  Knee/Hip;Shoulder      Knee/Hip Exercises: Aerobic   Nustep  L3 x12 min      Knee/Hip Exercises: Seated   Long Arc Quad  AROM;Both;2 sets;10 reps    [employer]  x15 reps    Clamshell with TheraBand  Yellow   x15 reps   Marching  AROM;Both;2 sets;10 reps    Sit to Sand  10 reps;with UE support      Shoulder Exercises: Seated   Flexion  AROM;Both;15 reps    Other Seated Exercises  x to v x10 reps                  PT Long Term Goals - 05/01/19 1345      PT LONG TERM GOAL #1   Title  Patient will be independent with HEP.    Time  6    Period  Weeks    Status  New      PT LONG TERM GOAL #2   Title  Patient will demonstrate modified 5x sit to stand test in 45 seconds or less to decrease fall risk and improve functional LE strength.    Time  6    Period  Weeks    Status  New      PT LONG TERM GOAL #3   Title  Patient will  ambulate 150 feet with rollator and close supervision to safely ambulate home.    Time  6    Period  Weeks    Status  New      PT LONG TERM GOAL #4   Title  Patient will demonstrate 4/5 or greater bilateral LE MMT to improve stablity during functional tasks.    Time  6    Period  Weeks    Status  New      PT LONG TERM GOAL #5   Title  Patient will report ability to perform ADLs with assistance with global pain less than or equal to 5/10.    Time  6    Period  Weeks    Status  New            Plan - 05/08/19 1057    Clinical Impression Statement  Patient presented in clinic with reports of weakness and starting new specialty MS medication. Involuntary tremors and movements noted throughout treatment and seemed to increase with fatigue. Rest breaks required during therex session especially following LE exercises. Gait ataxia more prevalent with fatigue as well. Involuntary movement of B ankles noted during ambulation.    Personal Factors and Comorbidities  Comorbidity 3+;Time since onset of injury/illness/exacerbation    Examination-Activity Limitations  Bed Mobility;Locomotion Level;Transfers;Hygiene/Grooming    Stability/Clinical Decision Making  Evolving/Moderate complexity    Rehab Potential  Fair    PT Frequency  2x / week    PT Duration  6 weeks    PT Treatment/Interventions  ADLs/Self Care Home Management;Cryotherapy;Electrical Stimulation;Moist Heat;Balance training;Therapeutic exercise;Therapeutic activities;Functional mobility training;Stair training;Gait training;Neuromuscular re-education;Patient/family education;Manual techniques;Passive range of motion    PT Next Visit Plan  nustep, gentle strengthening, therapeutic activities, transfers, PROM and manual stretching for spasticity    PT Home Exercise Plan  see patient education section    Consulted and Agree with Plan of Care  Patient       Patient will benefit from skilled therapeutic intervention in order to improve  the following deficits and impairments:  Abnormal gait, Decreased activity tolerance, Decreased balance,  Pain, Impaired tone, Difficulty walking, Decreased strength, Decreased mobility, Increased muscle spasms  Visit Diagnosis: Unsteadiness on feet  Muscle weakness (generalized)  Ataxic gait     Problem List Patient Active Problem List   Diagnosis Date Noted  . 'light-for-dates' infant with signs of fetal malnutrition 05/06/2019  . Seizure-like activity ([place]) 12/27/2018  . Dyslipidemia 12/20/2018  . Hyponatremia syndrome 07/04/2018  . DOE (dyspnea on exertion) 07/03/2018  . COPD mixed type ([place]) 07/03/2018  . MDD (major depressive disorder) 06/28/2018  . GAD (generalized anxiety disorder) 06/28/2018  . Major depressive disorder, recurrent episode, moderate ([place]) 04/17/2018  . Head injury 12/08/2017  . Cervical vertebral fusion 07/28/2017  . DDD (degenerative disc disease), cervical 07/10/2017  . DDD (degenerative disc disease), thoracic 07/10/2017  . DDD (degenerative disc disease), lumbar 07/10/2017  . Facet arthritis of cervical region 07/10/2017  . Abnormal liver function tests 03/07/2017  . Hypokalemia 03/07/2017  . Gastroesophageal reflux disease with esophagitis 03/07/2017  . Esophageal dysphagia 03/07/2017  . Chronic idiopathic constipation 02/01/2017  . Fatigue 02/01/2017  . HSV-1 infection 01/12/2017  . Osteoporosis with pathological fracture, with routine healing, subsequent encounter 08/30/2016  . MDD (major depressive disorder), recurrent episode, severe ([place]) 03/29/2016  . Estrogen deficiency 12/15/2015  . MS (multiple sclerosis) ([place]) 12/15/2015  . Tremor 12/15/2015  . Healthcare maintenance 12/15/2015  . Essential hypertension   . Protein-calorie malnutrition, severe 11/09/2015  . Bipolar 1 disorder, depressed ([place]) 02/11/2015    Standley Brooking, PTA 05/08/2019, 11:09 AM  [facility] [address] [place], [place], [address] Phone: [phone]   Fax:  [phone]  Name: Angel Maddox MRN: [id number] Date of Birth: [birth year]

## 2019-05-10 ENCOUNTER — Encounter: Payer: Self-pay | Admitting: Physical Therapy

## 2019-05-10 ENCOUNTER — Other Ambulatory Visit: Payer: Self-pay

## 2019-05-10 ENCOUNTER — Ambulatory Visit: Payer: Medicare Other | Admitting: Physical Therapy

## 2019-05-10 DIAGNOSIS — R2681 Unsteadiness on feet: Secondary | ICD-10-CM | POA: Diagnosis not present

## 2019-05-10 DIAGNOSIS — M6281 Muscle weakness (generalized): Secondary | ICD-10-CM

## 2019-05-10 DIAGNOSIS — R260 Ataxic gait: Secondary | ICD-10-CM

## 2019-05-10 NOTE — Therapy (Signed)
[facility] [place], [place], [address] Phone: [phone]   Fax:  [phone]  Physical Therapy Treatment  Patient Details  Name: Angel Maddox MRN: [id number] Date of Birth: [birth year] Referring Provider (PT): Delphia Grates, MD   Encounter Date: 05/10/2019  PT End of Session - 05/10/19 1122    Visit Number  3    Number of Visits  12    Date for PT Re-Evaluation  06/19/19    PT Start Time  1120    PT Stop Time  1209    PT Time Calculation (min)  49 min    Equipment Utilized During Treatment  Gait belt;Other (comment)   Rollator   Activity Tolerance  Patient limited by fatigue    Behavior During Therapy  [facility] for tasks assessed/performed       Past Medical History:  Diagnosis Date  . Anxiety   . Arthritis    osteo  . Bipolar 1 disorder ([place])   . Cervicalgia   . Chronic kidney disease    kidney stone  . COPD (chronic obstructive pulmonary disease) ([place])    new diagnosis- now sees pulmonologist  . Depression   . Elevated liver enzymes    She says "normal now"  . GERD (gastroesophageal reflux disease)   . H/O hiatal hernia   . Headache   . Hip fracture ([place]) 2017   left  . History of kidney stones    4-5 yrs ago  . Hypertension   . Multiple sclerosis ([place])     Had for 15 years  . Multiple sclerosis ([place])    dx 1999  . Tremors of nervous system     Past Surgical History:  Procedure Laterality Date  . ANTERIOR CERVICAL DECOMP/DISCECTOMY FUSION N/A 07/28/2017   Procedure: CERVICAL FOUR-FIVE, CERVICAL FIVE-SIX ANTERIOR CERVICAL DECOMPRESSION/DISCECTOMY FUSION;  Surgeon: Eustace Moore, MD;  Location: [facility];  Service: Neurosurgery;  Laterality: N/A;  . COLONOSCOPY  2008   Dr.Kaplan  . EYE SURGERY     [facility]  . HIP PINNING,CANNULATED Left 11/09/2015   Procedure: CANNULATED HIP PINNING;  Surgeon: Rod Can, MD;  [facility];  Service: Orthopedics;  Laterality: Left;  .  LIPOMA EXCISION    . TUBAL LIGATION      There were no vitals filed for this visit.  Subjective Assessment - 05/10/19 1121    Subjective  COVID 19 screening performed on patient upon arrival. Patient reports that her knees feel stiff this morning.    Pertinent History  MS, tremors, HTN, COPD, bipolar disorder, lumbar DDD, cervical fusion, history of right hip fracture & pinning    Limitations  Standing;Walking;House hold activities    Patient Stated Goals  improve mobility and improve strength in UEs and LEs and function    Currently in Pain?  No/denies         [facility] PT Assessment - 05/10/19 0001      Assessment   Medical Diagnosis  Multiple sclerosis    Referring Provider (PT)  Delphia Grates, MD    Next MD Visit  Late November 2020    Prior Therapy  yes      Precautions   Precautions  Fall    Precaution Comments  Ataxic gait; close supervision to min/mod A for ambulation and transfers at all times                   [facility] Adult PT Treatment/Exercise - 05/10/19 0001  Knee/Hip Exercises: Aerobic   Nustep  L1 x14 min      Knee/Hip Exercises: Seated   Long Arc Quad  AROM;Both;2 sets;10 reps    [employer]  x15 reps    Clamshell with TheraBand  --   AROM x15 reps   Marching  AROM;Both;2 sets;10 reps    Sit to Sand  5 reps;with UE support   limited by fatigue     Shoulder Exercises: Seated   Flexion  AROM;Both;15 reps    Abduction  AROM;Both;15 reps    Other Seated Exercises  x to v x15 reps                  PT Long Term Goals - 05/01/19 1345      PT LONG TERM GOAL #1   Title  Patient will be independent with HEP.    Time  6    Period  Weeks    Status  New      PT LONG TERM GOAL #2   Title  Patient will demonstrate modified 5x sit to stand test in 45 seconds or less to decrease fall risk and improve functional LE strength.    Time  6    Period  Weeks    Status  New      PT LONG TERM GOAL #3   Title  Patient will ambulate 150  feet with rollator and close supervision to safely ambulate home.    Time  6    Period  Weeks    Status  New      PT LONG TERM GOAL #4   Title  Patient will demonstrate 4/5 or greater bilateral LE MMT to improve stablity during functional tasks.    Time  6    Period  Weeks    Status  New      PT LONG TERM GOAL #5   Title  Patient will report ability to perform ADLs with assistance with global pain less than or equal to 5/10.    Time  6    Period  Weeks    Status  New            Plan - 05/10/19 1232    Clinical Impression Statement  Patient presented in clinic with no complaints of pain only knee stiffness. Patient already fatigued quickly with low level nustep. Rest breaks required due to fatigue. Patient able to ambulate WNL with minimal ataxia prior to start of PT. As patient fatigued more involuntary movements and tremors presented. Upon end of treatment and patient ambulating to front lobby, mod assist +2 required due to intense spasms and involuntary movement. Patient did consent to use of WC after several breaks due to fatigue.    Personal Factors and Comorbidities  Comorbidity 3+;Time since onset of injury/illness/exacerbation    Examination-Activity Limitations  Bed Mobility;Locomotion Level;Transfers;Hygiene/Grooming    Stability/Clinical Decision Making  Evolving/Moderate complexity    Rehab Potential  Fair    PT Frequency  2x / week    PT Duration  6 weeks    PT Treatment/Interventions  ADLs/Self Care Home Management;Cryotherapy;Electrical Stimulation;Moist Heat;Balance training;Therapeutic exercise;Therapeutic activities;Functional mobility training;Stair training;Gait training;Neuromuscular re-education;Patient/family education;Manual techniques;Passive range of motion    PT Next Visit Plan  nustep, gentle strengthening, therapeutic activities, transfers, PROM and manual stretching for spasticity    PT Home Exercise Plan  see patient education section    Consulted and  Agree with Plan of Care  Patient  Patient will benefit from skilled therapeutic intervention in order to improve the following deficits and impairments:  Abnormal gait, Decreased activity tolerance, Decreased balance, Pain, Impaired tone, Difficulty walking, Decreased strength, Decreased mobility, Increased muscle spasms  Visit Diagnosis: Unsteadiness on feet  Muscle weakness (generalized)  Ataxic gait     Problem List Patient Active Problem List   Diagnosis Date Noted  . 'light-for-dates' infant with signs of fetal malnutrition 05/06/2019  . Seizure-like activity ([place]) 12/27/2018  . Dyslipidemia 12/20/2018  . Hyponatremia syndrome 07/04/2018  . DOE (dyspnea on exertion) 07/03/2018  . COPD mixed type ([place]) 07/03/2018  . MDD (major depressive disorder) 06/28/2018  . GAD (generalized anxiety disorder) 06/28/2018  . Major depressive disorder, recurrent episode, moderate ([place]) 04/17/2018  . Head injury 12/08/2017  . Cervical vertebral fusion 07/28/2017  . DDD (degenerative disc disease), cervical 07/10/2017  . DDD (degenerative disc disease), thoracic 07/10/2017  . DDD (degenerative disc disease), lumbar 07/10/2017  . Facet arthritis of cervical region 07/10/2017  . Abnormal liver function tests 03/07/2017  . Hypokalemia 03/07/2017  . Gastroesophageal reflux disease with esophagitis 03/07/2017  . Esophageal dysphagia 03/07/2017  . Chronic idiopathic constipation 02/01/2017  . Fatigue 02/01/2017  . HSV-1 infection 01/12/2017  . Osteoporosis with pathological fracture, with routine healing, subsequent encounter 08/30/2016  . MDD (major depressive disorder), recurrent episode, severe ([place]) 03/29/2016  . Estrogen deficiency 12/15/2015  . MS (multiple sclerosis) ([place]) 12/15/2015  . Tremor 12/15/2015  . Healthcare maintenance 12/15/2015  . Essential hypertension   . Protein-calorie malnutrition, severe 11/09/2015  . Bipolar 1 disorder, depressed ([place]) 02/11/2015     Standley Brooking 05/10/2019, 12:40 PM  [facility] [place], [place], [address] Phone: [phone]   Fax:  [phone]  Name: LUNAMARIE LYU MRN: [id number] Date of Birth: January 16, 1956

## 2019-05-12 ENCOUNTER — Encounter: Payer: Self-pay | Admitting: Internal Medicine

## 2019-05-12 NOTE — Assessment & Plan Note (Addendum)
Patient is currently on Amlodipine 5 mg daily. Her home log reveals persistently elevated readings of 160-170/90-100. She is asymptomatic. Will increase amlodipine to 10 mg. She has a history of hyponatremia which was attributed to HCTZ. Will and add Lisinopril 10 mg, as I anticipate she will likely need more than 1 medication for adequate control. Follow-up in 4 weeks for blood pressure re-check and BMP.

## 2019-05-12 NOTE — Progress Notes (Signed)
   CC: HTN, HLD  HPI:  Angel Maddox is a 63 y.o. female with PMHx listed below who presents for follow-up on chronic HTN, hyperlipidemia. Please see problem based charting for further details.   Past Medical History:  Diagnosis Date  . Anxiety   . Arthritis    osteo  . Bipolar 1 disorder ([place])   . Cervicalgia   . Chronic kidney disease    kidney stone  . COPD (chronic obstructive pulmonary disease) ([place])    new diagnosis- now sees pulmonologist  . Depression   . Elevated liver enzymes    She says "normal now"  . GERD (gastroesophageal reflux disease)   . H/O hiatal hernia   . Headache   . Hip fracture ([place]) 2017   left  . History of kidney stones    4-5 yrs ago  . Hypertension   . Multiple sclerosis ([place])     Had for 15 years  . Multiple sclerosis ([place])    dx 1999  . Tremors of nervous system    Review of Systems:   Review of Systems  Constitutional: Negative for chills and fever.  HENT: Positive for congestion. Negative for sinus pain and sore throat.   Eyes: Negative for blurred vision.  Respiratory: Negative for shortness of breath.   Cardiovascular: Negative for chest pain, palpitations and leg swelling.  Gastrointestinal: Negative for abdominal pain.  Genitourinary: Negative for dysuria.  Neurological: Negative for loss of consciousness and headaches.     Physical Exam:  Vitals:   05/06/19 1549  BP: (!) 164/101  Pulse: 79  Temp: 98.7 F (37.1 C)  TempSrc: Oral  SpO2: 98%  Weight: 135 lb 3.2 oz (61.3 kg)   General: alert, appears stated age, sitting in her wheelchair in NAD HEENT: conjunctiva normal; EOMI Neck: supple; no thyromegaly or cervical LAD CV: RRR Pulm: normal work of breathing; lungs CTAB Abd: BS+; abdomen soft, non-tender, non-distended Ext: No pitting edema Neuro: A&Ox3; strength is 4+/5 in BLE, 5/5 in BUE; sensation intact throughout Skin: warm and dry; no rashes or skin breakdown Psych: appropriate mood and affect    Assessment & Plan:   See Encounters Tab for problem based charting.  Patient discussed with Dr. Philipp Ovens

## 2019-05-12 NOTE — Assessment & Plan Note (Signed)
Patient on Atorvastatin 40 mg for primary prevention based on lipid panel earlier this year. Will refill today.

## 2019-05-13 NOTE — Progress Notes (Signed)
Internal Medicine Clinic Attending  Case discussed with Dr. Krienke at the time of the visit.  We reviewed the resident's history and exam and pertinent patient test results.  I agree with the assessment, diagnosis, and plan of care documented in the resident's note.    

## 2019-05-14 ENCOUNTER — Ambulatory Visit: Payer: Medicare Other | Admitting: Psychiatry

## 2019-05-15 ENCOUNTER — Encounter: Payer: Self-pay | Admitting: Physical Therapy

## 2019-05-15 ENCOUNTER — Ambulatory Visit: Payer: Medicare Other | Admitting: Physical Therapy

## 2019-05-15 ENCOUNTER — Other Ambulatory Visit: Payer: Self-pay

## 2019-05-15 DIAGNOSIS — R2681 Unsteadiness on feet: Secondary | ICD-10-CM | POA: Diagnosis not present

## 2019-05-15 DIAGNOSIS — M6281 Muscle weakness (generalized): Secondary | ICD-10-CM

## 2019-05-15 DIAGNOSIS — R260 Ataxic gait: Secondary | ICD-10-CM

## 2019-05-15 NOTE — Therapy (Signed)
[facility] [place], [place], [address] Phone: [phone]   Fax:  [phone]  Physical Therapy Treatment  Patient Details  Name: Angel Maddox MRN: [id number] Date of Birth: September 24, 1955 Referring Provider (PT): Delphia Grates, MD   Encounter Date: 05/15/2019  PT End of Session - 05/15/19 1151    Visit Number  4    Number of Visits  12    Date for PT Re-Evaluation  06/19/19    PT Start Time  1115    PT Stop Time  1151    PT Time Calculation (min)  36 min    Equipment Utilized During Treatment  Gait belt;Other (comment)   rollater   Activity Tolerance  Patient limited by fatigue    Behavior During Therapy  [facility] for tasks assessed/performed       Past Medical History:  Diagnosis Date  . Anxiety   . Arthritis    osteo  . Bipolar 1 disorder ([place])   . Cervicalgia   . Chronic kidney disease    kidney stone  . COPD (chronic obstructive pulmonary disease) ([place])    new diagnosis- now sees pulmonologist  . Depression   . Elevated liver enzymes    She says "normal now"  . GERD (gastroesophageal reflux disease)   . H/O hiatal hernia   . Headache   . Hip fracture ([place]) 2017   left  . History of kidney stones    4-5 yrs ago  . Hypertension   . Multiple sclerosis ([place])     Had for 15 years  . Multiple sclerosis ([place])    dx 1999  . Tremors of nervous system     Past Surgical History:  Procedure Laterality Date  . ANTERIOR CERVICAL DECOMP/DISCECTOMY FUSION N/A 07/28/2017   Procedure: CERVICAL FOUR-FIVE, CERVICAL FIVE-SIX ANTERIOR CERVICAL DECOMPRESSION/DISCECTOMY FUSION;  Surgeon: Eustace Moore, MD;  Location: [place];  Service: Neurosurgery;  Laterality: N/A;  . COLONOSCOPY  2008   Dr.Kaplan  . EYE SURGERY     [facility]  . HIP PINNING,CANNULATED Left 11/09/2015   Procedure: CANNULATED HIP PINNING;  Surgeon: Rod Can, MD;  [facility];  Service: Orthopedics;  Laterality: Left;  .  LIPOMA EXCISION    . TUBAL LIGATION      There were no vitals filed for this visit.  Subjective Assessment - 05/15/19 1120    Subjective  COVID 19 screening performed on patient upon arrival. Patient reported stiffness all over and knees feel like they are locking up    Pertinent History  MS, tremors, HTN, COPD, bipolar disorder, lumbar DDD, cervical fusion, history of right hip fracture & pinning    Limitations  Standing;Walking;House hold activities    Patient Stated Goals  improve mobility and improve strength in UEs and LEs and function    Currently in Pain?  No/denies                       [facility] Adult PT Treatment/Exercise - 05/15/19 0001      Ambulation/Gait   Ambulation/Gait  Yes    Ambulation Distance (Feet)  45 Feet   2 rest breaks    Assistive device  Rollator    Gait Pattern  Step-through pattern;Ataxic;Decreased stride length;Decreased dorsiflexion - right;Decreased dorsiflexion - left;Poor foot clearance - right;Poor foot clearance - left    Ambulation Surface  Indoor;Outdoor    Gait velocity  slow and cautious    Gait Comments  ataxic  gait pattern at end of session requiring min A x2 for safety.      Exercises   Exercises  Ankle;Knee/Hip      Knee/Hip Exercises: Aerobic   Nustep  L1 x36min UE/LE activity      Knee/Hip Exercises: Seated   Ball Squeeze  x20    Clamshell with TheraBand  Yellow   x20   Sit to Sand  with UE support   x4, limited due to fatiue      Shoulder Exercises: Seated   Retraction  Strengthening;Both;20 reps   no resistance and hands on arm rest     Ankle Exercises: Seated   Heel Raises  Both;20 reps    Toe Raise  20 reps    Other Seated Ankle Exercises  seated rockerboard x10min                  PT Long Term Goals - 05/15/19 1122      PT LONG TERM GOAL #1   Title  Patient will be independent with HEP.    Time  6    Period  Weeks    Status  On-going      PT LONG TERM GOAL #2   Title  Patient will  demonstrate modified 5x sit to stand test in 45 seconds or less to decrease fall risk and improve functional LE strength.    Period  Weeks    Status  On-going      PT LONG TERM GOAL #3   Title  Patient will ambulate 150 feet with rollator and close supervision to safely ambulate home.    Time  6    Period  Weeks    Status  On-going      PT LONG TERM GOAL #4   Title  Patient will demonstrate 4/5 or greater bilateral LE MMT to improve stablity during functional tasks.    Time  6    Period  Weeks    Status  On-going      PT LONG TERM GOAL #5   Title  Patient will report ability to perform ADLs with assistance with global pain less than or equal to 5/10.    Time  6    Period  Weeks    Status  On-going            Plan - 05/15/19 1155    Clinical Impression Statement  Patient tolerated treatment fair due to fatigue and stiffness today. Patient only able to perform minimal exercises and today focused on closed chain activities. Patient current goals ongoing due to limitations. Patient required min assist throughout for safety and x2 with gait training due to safety.    Personal Factors and Comorbidities  Comorbidity 3+;Time since onset of injury/illness/exacerbation    Examination-Activity Limitations  Bed Mobility;Locomotion Level;Transfers;Hygiene/Grooming    Stability/Clinical Decision Making  Evolving/Moderate complexity    Rehab Potential  Fair    PT Frequency  2x / week    PT Duration  6 weeks    PT Treatment/Interventions  ADLs/Self Care Home Management;Cryotherapy;Electrical Stimulation;Moist Heat;Balance training;Therapeutic exercise;Therapeutic activities;Functional mobility training;Stair training;Gait training;Neuromuscular re-education;Patient/family education;Manual techniques;Passive range of motion    PT Next Visit Plan  cont with POC nustep, gentle strengthening, therapeutic activities, transfers, PROM and manual stretching for spasticity    Consulted and Agree with  Plan of Care  Patient       Patient will benefit from skilled therapeutic intervention in order to improve the following deficits and impairments:  Abnormal  gait, Decreased activity tolerance, Decreased balance, Pain, Impaired tone, Difficulty walking, Decreased strength, Decreased mobility, Increased muscle spasms  Visit Diagnosis: Unsteadiness on feet  Muscle weakness (generalized)  Ataxic gait     Problem List Patient Active Problem List   Diagnosis Date Noted  . 'light-for-dates' infant with signs of fetal malnutrition 05/06/2019  . Seizure-like activity ([place]) 12/27/2018  . Dyslipidemia 12/20/2018  . Hyponatremia syndrome 07/04/2018  . DOE (dyspnea on exertion) 07/03/2018  . COPD mixed type ([place]) 07/03/2018  . MDD (major depressive disorder) 06/28/2018  . GAD (generalized anxiety disorder) 06/28/2018  . Major depressive disorder, recurrent episode, moderate ([place]) 04/17/2018  . Head injury 12/08/2017  . Cervical vertebral fusion 07/28/2017  . DDD (degenerative disc disease), cervical 07/10/2017  . DDD (degenerative disc disease), thoracic 07/10/2017  . DDD (degenerative disc disease), lumbar 07/10/2017  . Facet arthritis of cervical region 07/10/2017  . Abnormal liver function tests 03/07/2017  . Hypokalemia 03/07/2017  . Gastroesophageal reflux disease with esophagitis 03/07/2017  . Esophageal dysphagia 03/07/2017  . Chronic idiopathic constipation 02/01/2017  . Fatigue 02/01/2017  . HSV-1 infection 01/12/2017  . Osteoporosis with pathological fracture, with routine healing, subsequent encounter 08/30/2016  . MDD (major depressive disorder), recurrent episode, severe ([place]) 03/29/2016  . Estrogen deficiency 12/15/2015  . MS (multiple sclerosis) ([place]) 12/15/2015  . Tremor 12/15/2015  . Healthcare maintenance 12/15/2015  . Essential hypertension   . Protein-calorie malnutrition, severe 11/09/2015  . Bipolar 1 disorder, depressed ([place]) 02/11/2015    Chrisopher Pustejovsky,  Danni Shima P, PTA 05/15/2019, 12:01 PM  [facility] [address] [place], [place], [address] Phone: [phone]   Fax:  [phone]  Name: Angel Maddox MRN: [id number] Date of Birth: [birth year]

## 2019-05-16 ENCOUNTER — Ambulatory Visit (INDEPENDENT_AMBULATORY_CARE_PROVIDER_SITE_OTHER): Payer: Medicare Other | Admitting: Psychiatry

## 2019-05-16 DIAGNOSIS — F331 Major depressive disorder, recurrent, moderate: Secondary | ICD-10-CM | POA: Diagnosis not present

## 2019-05-16 NOTE — Progress Notes (Signed)
[facility] Counselor/Therapist Progress Note  Patient ID: Angel Maddox, MRN: [id number],    Date: 05/16/2019  Time Spent:  60 minutes 10:00am to 11:00am  Virtual Visit with Video Note Connected with patient by a video enabled telemedicine/telehealth application or telephone, with their informed consent, and verified patient privacy and that I am speaking with the correct person using two identifiers. I discussed the limitations, risks, security and privacy concerns of performing psychotherapy and management service by telephone and the availability of in person appointments. I also discussed with the patient that there may be a patient responsible charge related to this service. The patient expressed understanding and agreed to proceed. I discussed the treatment planning with the patient. The patient was provided an opportunity to ask questions and all were answered. The patient agreed with the plan and demonstrated an understanding of the instructions. The patient was advised to call  our office if  symptoms worsen or feel they are in a crisis state and need immediate contact.   Therapist Location: [facility] Patient Location: home   Treatment Type: Individual Therapy  Reported Symptoms: depression, anxiety  Mental Status Exam:  Appearance:   Casual     Behavior:  Appropriate and Sharing  Motor:  Normal  Speech/Language:   Normal Rate  Affect:  Depressed and anxious  Mood:  anxious and depressed  Thought process:  goal directed  Thought content:    WNL  Sensory/Perceptual disturbances:    WNL  Orientation:  oriented to person, place, time/date, situation, day of week, month of year and year  Attention:  Good  Concentration:  Good  Memory:  Pt reports some short term memory issues and states PCP is aware  Fund of knowledge:   Good  Insight:    Fair(today)  Judgment:   Good  Impulse Control:  Fair(today)   Risk Assessment: Danger to Self:   No Self-injurious Behavior: No Danger to Others: No Duty to Warn:no Physical Aggression / Violence:No  Access to Firearms a concern: No  Gang Involvement:No   Subjective:  Patient today is anxious, depressed, frustrated, grief, and irritability. Denies any SI.  Interventions: Solution-Oriented/Positive Psychology and Ego-Supportive  Diagnosis:   ICD-10-CM   1. Major depressive disorder, recurrent episode, moderate ([place])  F33.1     Plan: Patient not signing tx plan updates on computer screen due to [place].   Treatment Goals: Goals may remain on tx plan as patient works on strategies to meet her goals. Progress will be documented each session in "Progress" section.  Long term goal: Develop the ability to recognize, accept, and cope with feelings of depression.  Short term goal: Learn and implement calming skills to reduce overall tension and moments of increased anxiety and/or depression.Patient will recognize her negative self-talk, interrupt it, and not let it prevent her from trying new strategies to help herself.  Strategy: Teach patient cognitive and somatic calming skills . Rehearse with patient how to apply these skills to her daily life. Review and reinforce success while providing corrective feedback toward consistent implementation.  PROGRESS: Patient hasn't made much progress most recently on goals. Is having a real difficult time today and in past couple weeks with anxiety, depression, and sadness and we needed to address this today during session. Feeling that people in family aren't helping her in ways that she needs.  States "they know what they can do to help but don't do it and I'm not going to tell or ask them to  do things." Patient does have significant needs but is able to live on her own with her husband who has helped her in past.  Per patient they "have not been getting along well lately, but it's up and down." Therapist has encouraged them before to  get help with their relationship but that has not happened.  Talking about these things, led patient to focus on "what others do not do for me and also talking about the loss of her adult son several years ago for which she did get help".  Her son's death is a tender spot for her and it seems to come up mostly when there are other hurts being addressed.  Talked with her regarding grief issues that do come up occasionally and also acknowledged her strengths that she has shown in dealing with grief previously and with other difficult situations in her life. Did encourage her to self-advocate more and ask for what she needs as other people do not always pick up on how they might be needed to help someone.  Denies any SI.  Goal review and her efforts/strengths emphasized along with a couple "positives" in her life.   Next appt within 3-4 wks.   [name], LCSW

## 2019-05-17 ENCOUNTER — Encounter: Payer: Self-pay | Admitting: Physical Therapy

## 2019-05-17 ENCOUNTER — Ambulatory Visit: Payer: Medicare Other | Admitting: Physical Therapy

## 2019-05-17 ENCOUNTER — Other Ambulatory Visit: Payer: Self-pay

## 2019-05-17 DIAGNOSIS — R2681 Unsteadiness on feet: Secondary | ICD-10-CM

## 2019-05-17 DIAGNOSIS — R260 Ataxic gait: Secondary | ICD-10-CM

## 2019-05-17 DIAGNOSIS — M6281 Muscle weakness (generalized): Secondary | ICD-10-CM

## 2019-05-17 NOTE — Therapy (Signed)
[facility] [place], [place], [address] Phone: [phone]   Fax:  [phone]  Physical Therapy Treatment  Patient Details  Name: Angel Maddox MRN: [id number] Date of Birth: 22-Apr-1956 Referring Provider (PT): Delphia Grates, MD   Encounter Date: 05/17/2019  PT End of Session - 05/17/19 1138    Visit Number  5    Number of Visits  12    Date for PT Re-Evaluation  06/19/19    PT Start Time  [id number]    PT Stop Time  1155    PT Time Calculation (min)  19 min    Equipment Utilized During Treatment  Other (comment)   Rollator   Activity Tolerance  Patient limited by fatigue    Behavior During Therapy  [facility] for tasks assessed/performed       Past Medical History:  Diagnosis Date  . Anxiety   . Arthritis    osteo  . Bipolar 1 disorder ([place])   . Cervicalgia   . Chronic kidney disease    kidney stone  . COPD (chronic obstructive pulmonary disease) ([place])    new diagnosis- now sees pulmonologist  . Depression   . Elevated liver enzymes    She says "normal now"  . GERD (gastroesophageal reflux disease)   . H/O hiatal hernia   . Headache   . Hip fracture ([place]) 2017   left  . History of kidney stones    4-5 yrs ago  . Hypertension   . Multiple sclerosis ([place])     Had for 15 years  . Multiple sclerosis ([place])    dx 1999  . Tremors of nervous system     Past Surgical History:  Procedure Laterality Date  . ANTERIOR CERVICAL DECOMP/DISCECTOMY FUSION N/A 07/28/2017   Procedure: CERVICAL FOUR-FIVE, CERVICAL FIVE-SIX ANTERIOR CERVICAL DECOMPRESSION/DISCECTOMY FUSION;  Surgeon: Eustace Moore, MD;  Location: [facility];  Service: Neurosurgery;  Laterality: N/A;  . COLONOSCOPY  2008   Dr.Kaplan  . EYE SURGERY     [facility]  . HIP PINNING,CANNULATED Left 11/09/2015   Procedure: CANNULATED HIP PINNING;  Surgeon: Rod Can, MD;  [facility];  Service: Orthopedics;  Laterality: Left;  . LIPOMA  EXCISION    . TUBAL LIGATION      There were no vitals filed for this visit.  Subjective Assessment - 05/17/19 1137    Subjective  COVID 19 screening performed on patient upon arrival. Patient reports not feeling well upon arrival and has been up since 1 am. Reports around 4 pm daily she has back, hands, shoulders pain. And also reports numbness in L inferior calf.    Pertinent History  MS, tremors, HTN, COPD, bipolar disorder, lumbar DDD, cervical fusion, history of right hip fracture & pinning    Limitations  Standing;Walking;House hold activities    Patient Stated Goals  improve mobility and improve strength in UEs and LEs and function    Currently in Pain?  No/denies         [facility] PT Assessment - 05/17/19 0001      Assessment   Medical Diagnosis  Multiple sclerosis    Referring Provider (PT)  Delphia Grates, MD    Next MD Visit  Late November 2020    Prior Therapy  yes      Precautions   Precautions  Fall    Precaution Comments  Ataxic gait; close supervision to min/mod A for ambulation and transfers at all times  [facility] Adult PT Treatment/Exercise - 05/17/19 0001      Knee/Hip Exercises: Aerobic   Nustep  L1 x11 min UE/LE activity      Knee/Hip Exercises: Seated   Long Arc Quad  AROM;Both;15 reps    Clamshell with TheraBand  Yellow   x15 reps   Marching  AROM;Both;15 reps                  PT Long Term Goals - 05/15/19 1122      PT LONG TERM GOAL #1   Title  Patient will be independent with HEP.    Time  6    Period  Weeks    Status  On-going      PT LONG TERM GOAL #2   Title  Patient will demonstrate modified 5x sit to stand test in 45 seconds or less to decrease fall risk and improve functional LE strength.    Period  Weeks    Status  On-going      PT LONG TERM GOAL #3   Title  Patient will ambulate 150 feet with rollator and close supervision to safely ambulate home.    Time  6    Period  Weeks    Status   On-going      PT LONG TERM GOAL #4   Title  Patient will demonstrate 4/5 or greater bilateral LE MMT to improve stablity during functional tasks.    Time  6    Period  Weeks    Status  On-going      PT LONG TERM GOAL #5   Title  Patient will report ability to perform ADLs with assistance with global pain less than or equal to 5/10.    Time  6    Period  Weeks    Status  On-going            Plan - 05/17/19 1212    Clinical Impression Statement  Patient presneted in clinic with reports of not feeling well upon arrival. Patient only able to complete minimal therex secondary to fatigue and LE spasticity. Therex shortened due to spasticity and tremors already beginning. Patient limited with gait today secondary to spasticity and strong ataxic gait. Any gait completed following end of treatment requires mod assist for safety and +2 assist.    Personal Factors and Comorbidities  Comorbidity 3+;Time since onset of injury/illness/exacerbation    Examination-Activity Limitations  Bed Mobility;Locomotion Level;Transfers;Hygiene/Grooming    Stability/Clinical Decision Making  Evolving/Moderate complexity    Rehab Potential  Fair    PT Frequency  2x / week    PT Duration  6 weeks    PT Treatment/Interventions  ADLs/Self Care Home Management;Cryotherapy;Electrical Stimulation;Moist Heat;Balance training;Therapeutic exercise;Therapeutic activities;Functional mobility training;Stair training;Gait training;Neuromuscular re-education;Patient/family education;Manual techniques;Passive range of motion    PT Next Visit Plan  cont with POC nustep, gentle strengthening, therapeutic activities, transfers, PROM and manual stretching for spasticity    PT Home Exercise Plan  see patient education section    Consulted and Agree with Plan of Care  Patient       Patient will benefit from skilled therapeutic intervention in order to improve the following deficits and impairments:  Abnormal gait, Decreased  activity tolerance, Decreased balance, Pain, Impaired tone, Difficulty walking, Decreased strength, Decreased mobility, Increased muscle spasms  Visit Diagnosis: Unsteadiness on feet  Muscle weakness (generalized)  Ataxic gait     Problem List Patient Active Problem List   Diagnosis Date Noted  . 'light-for-dates' infant with  signs of fetal malnutrition 05/06/2019  . Seizure-like activity ([place]) 12/27/2018  . Dyslipidemia 12/20/2018  . Hyponatremia syndrome 07/04/2018  . DOE (dyspnea on exertion) 07/03/2018  . COPD mixed type ([place]) 07/03/2018  . MDD (major depressive disorder) 06/28/2018  . GAD (generalized anxiety disorder) 06/28/2018  . Major depressive disorder, recurrent episode, moderate ([place]) 04/17/2018  . Head injury 12/08/2017  . Cervical vertebral fusion 07/28/2017  . DDD (degenerative disc disease), cervical 07/10/2017  . DDD (degenerative disc disease), thoracic 07/10/2017  . DDD (degenerative disc disease), lumbar 07/10/2017  . Facet arthritis of cervical region 07/10/2017  . Abnormal liver function tests 03/07/2017  . Hypokalemia 03/07/2017  . Gastroesophageal reflux disease with esophagitis 03/07/2017  . Esophageal dysphagia 03/07/2017  . Chronic idiopathic constipation 02/01/2017  . Fatigue 02/01/2017  . HSV-1 infection 01/12/2017  . Osteoporosis with pathological fracture, with routine healing, subsequent encounter 08/30/2016  . MDD (major depressive disorder), recurrent episode, severe ([place]) 03/29/2016  . Estrogen deficiency 12/15/2015  . MS (multiple sclerosis) ([place]) 12/15/2015  . Tremor 12/15/2015  . Healthcare maintenance 12/15/2015  . Essential hypertension   . Protein-calorie malnutrition, severe 11/09/2015  . Bipolar 1 disorder, depressed ([place]) 02/11/2015    Standley Brooking, PTA 05/17/2019, 12:16 PM  [facility] [address] [place], [place], [address] Phone: [phone]   Fax:   [phone]  Name: DESTENEY HOGLE MRN: [id number] Date of Birth: [birth year]

## 2019-05-20 ENCOUNTER — Encounter: Payer: Medicare Other | Admitting: Physical Therapy

## 2019-05-22 ENCOUNTER — Encounter: Payer: Medicare Other | Admitting: Physical Therapy

## 2019-05-22 ENCOUNTER — Telehealth: Payer: Self-pay | Admitting: Psychiatry

## 2019-05-22 NOTE — Telephone Encounter (Signed)
FYI Traci and Krystal.  No action needed. Pt reports 'nobody cares about me.  The only person that does is [place]. My granddaughter that I used to be close to doesn't call me anymore. My health is worse.  I'm in therapy for the MS and they have to push me out in a WC after therapy.  It's awful. I don't have anybody to talk to."  Denies SI, although 'if I went to sleep and didn't wake up, it would be ok.'  I reviewed her medications.  She has been put on Seroquel 50 mg per her neurologist, I think just for 2 weeks due to steroid induced insomnia, but she states she is not sure.  I told her to go to the emergency room if her suicidal thoughts become active and she has a plan, which she denies right now.  Patient reports [place] hospital there to go to if she needs to go.  I will ask our administrative staff to get her in to see me sooner than 2 months from now and sooner with [name], LCSW if at all possible.  Patient verbalizes understanding.

## 2019-05-22 NOTE — Telephone Encounter (Signed)
I am calling her now.

## 2019-05-22 NOTE — Telephone Encounter (Signed)
Pt stated she need someone to give her a call.Stated she doesn't know if it's medication related or not ,but she is in a really dark place. Appt not scheduled until 12/16.

## 2019-05-28 ENCOUNTER — Telehealth: Payer: Self-pay | Admitting: Internal Medicine

## 2019-05-28 NOTE — Telephone Encounter (Signed)
Pt is having side effects from the new bp medicine, pt unable to get some rest, dry mouth and cough [phone]

## 2019-05-29 ENCOUNTER — Other Ambulatory Visit: Payer: Self-pay

## 2019-05-29 ENCOUNTER — Ambulatory Visit: Payer: Medicare Other | Attending: Psychiatry | Admitting: Physical Therapy

## 2019-05-29 ENCOUNTER — Encounter: Payer: Self-pay | Admitting: Physical Therapy

## 2019-05-29 DIAGNOSIS — R2681 Unsteadiness on feet: Secondary | ICD-10-CM | POA: Insufficient documentation

## 2019-05-29 DIAGNOSIS — R260 Ataxic gait: Secondary | ICD-10-CM | POA: Insufficient documentation

## 2019-05-29 DIAGNOSIS — M6281 Muscle weakness (generalized): Secondary | ICD-10-CM | POA: Diagnosis present

## 2019-05-29 NOTE — Therapy (Signed)
[facility] [place], [place], [address] Phone: [phone]   Fax:  [phone]  Physical Therapy Treatment  Patient Details  Name: Angel Maddox MRN: [id number] Date of Birth: 1955-10-20 Referring Provider (PT): Delphia Grates, MD   Encounter Date: 05/29/2019  PT End of Session - 05/29/19 1116    Visit Number  6    Number of Visits  12    Date for PT Re-Evaluation  06/19/19    PT Start Time  1116    PT Stop Time  1143   limited by excessive fatigue   PT Time Calculation (min)  27 min    Equipment Utilized During Treatment  Other (comment)    Activity Tolerance  Patient limited by fatigue    Behavior During Therapy  [facility] for tasks assessed/performed       Past Medical History:  Diagnosis Date  . Anxiety   . Arthritis    osteo  . Bipolar 1 disorder ([place])   . Cervicalgia   . Chronic kidney disease    kidney stone  . COPD (chronic obstructive pulmonary disease) ([place])    new diagnosis- now sees pulmonologist  . Depression   . Elevated liver enzymes    She says "normal now"  . GERD (gastroesophageal reflux disease)   . H/O hiatal hernia   . Headache   . Hip fracture ([place]) 2017   left  . History of kidney stones    4-5 yrs ago  . Hypertension   . Multiple sclerosis ([place])     Had for 15 years  . Multiple sclerosis ([place])    dx 1999  . Tremors of nervous system     Past Surgical History:  Procedure Laterality Date  . ANTERIOR CERVICAL DECOMP/DISCECTOMY FUSION N/A 07/28/2017   Procedure: CERVICAL FOUR-FIVE, CERVICAL FIVE-SIX ANTERIOR CERVICAL DECOMPRESSION/DISCECTOMY FUSION;  Surgeon: Eustace Moore, MD;  Location: [facility];  Service: Neurosurgery;  Laterality: N/A;  . COLONOSCOPY  2008   Dr.Kaplan  . EYE SURGERY     [facility]  . HIP PINNING,CANNULATED Left 11/09/2015   Procedure: CANNULATED HIP PINNING;  Surgeon: Rod Can, MD;  Location: [facility];  Service: Orthopedics;  Laterality:  Left;  . LIPOMA EXCISION    . TUBAL LIGATION      There were no vitals filed for this visit.  Subjective Assessment - 05/29/19 1115    Subjective  COVID 19 screening performed on patient upon arrival. Patient reports that her infusion has worn off and she is incredibly fatigued. Reports not sleeping at night. Reports that her husband didn't want her to come today due to the fatigue but wants to try.    Pertinent History  MS, tremors, HTN, COPD, bipolar disorder, lumbar DDD, cervical fusion, history of right hip fracture & pinning    Limitations  Standing;Walking;House hold activities    Patient Stated Goals  improve mobility and improve strength in UEs and LEs and function    Currently in Pain?  No/denies         [facility] PT Assessment - 05/29/19 0001      Assessment   Medical Diagnosis  Multiple sclerosis    Referring Provider (PT)  Delphia Grates, MD    Next MD Visit  Late November 2020    Prior Therapy  yes      Precautions   Precautions  Fall    Precaution Comments  Ataxic gait; close supervision to min/mod A for ambulation and transfers at  all times                   [facility] Adult PT Treatment/Exercise - 05/29/19 0001      Transfers   Transfers  Stand Pivot Transfers    Stand Pivot Transfers  5: Supervision    Stand Pivot Transfer Details (indicate cue type and reason)  close supervision for safety      Knee/Hip Exercises: Seated   Long Arc Quad  AROM;Both;10 reps    Ball Squeeze  x10 reps    Clamshell with TheraBand  --   AROM x10 reps   Other Seated Knee/Hip Exercises  B toe and heel raise x10 reps    Marching  AROM;Both;10 reps    Hamstring Curl  AROM;Both;10 reps    Sit to Sand  10 reps;with UE support      Shoulder Exercises: Seated   Horizontal ABduction  AROM;Both;10 reps    Flexion  AROM;Both;10 reps    Other Seated Exercises  B W back x10 reps                  PT Long Term Goals - 05/15/19 1122      PT LONG TERM GOAL #1   Title   Patient will be independent with HEP.    Time  6    Period  Weeks    Status  On-going      PT LONG TERM GOAL #2   Title  Patient will demonstrate modified 5x sit to stand test in 45 seconds or less to decrease fall risk and improve functional LE strength.    Period  Weeks    Status  On-going      PT LONG TERM GOAL #3   Title  Patient will ambulate 150 feet with rollator and close supervision to safely ambulate home.    Time  6    Period  Weeks    Status  On-going      PT LONG TERM GOAL #4   Title  Patient will demonstrate 4/5 or greater bilateral LE MMT to improve stablity during functional tasks.    Time  6    Period  Weeks    Status  On-going      PT LONG TERM GOAL #5   Title  Patient will report ability to perform ADLs with assistance with global pain less than or equal to 5/10.    Time  6    Period  Weeks    Status  On-going            Plan - 05/29/19 1209    Clinical Impression Statement  Patient presented in clinic in [facility] as she states she is super fatigued as infusion effects have decreased. Patient to recieve new infusion tomorrow. Patient unable to sleep well recently as well due to coughing from new BP medication. Seated exercises completed but education provided to patient that PT should avoid overfatigue to avoid falls and maintain her function. Less reps completed for all exercises secondary to fatigue. Spasticity noted in sititng during HS curls AROM. Patient noted involuntary toe elevation during sit to stand. Sit to stands especially fatiguing for patient.    Personal Factors and Comorbidities  Comorbidity 3+;Time since onset of injury/illness/exacerbation    Examination-Activity Limitations  Bed Mobility;Locomotion Level;Transfers;Hygiene/Grooming    Stability/Clinical Decision Making  Evolving/Moderate complexity    Rehab Potential  Fair    PT Frequency  2x / week    PT Duration  6 weeks    PT Treatment/Interventions  ADLs/Self Care Home  Management;Cryotherapy;Electrical Stimulation;Moist Heat;Balance training;Therapeutic exercise;Therapeutic activities;Functional mobility training;Stair training;Gait training;Neuromuscular re-education;Patient/family education;Manual techniques;Passive range of motion    PT Next Visit Plan  cont with POC nustep, gentle strengthening, therapeutic activities, transfers, PROM and manual stretching for spasticity    PT Home Exercise Plan  see patient education section    Consulted and Agree with Plan of Care  Patient       Patient will benefit from skilled therapeutic intervention in order to improve the following deficits and impairments:  Abnormal gait, Decreased activity tolerance, Decreased balance, Pain, Impaired tone, Difficulty walking, Decreased strength, Decreased mobility, Increased muscle spasms  Visit Diagnosis: Unsteadiness on feet  Muscle weakness (generalized)  Ataxic gait     Problem List Patient Active Problem List   Diagnosis Date Noted  . 'light-for-dates' infant with signs of fetal malnutrition 05/06/2019  . Seizure-like activity ([place]) 12/27/2018  . Dyslipidemia 12/20/2018  . Hyponatremia syndrome 07/04/2018  . DOE (dyspnea on exertion) 07/03/2018  . COPD mixed type ([place]) 07/03/2018  . MDD (major depressive disorder) 06/28/2018  . GAD (generalized anxiety disorder) 06/28/2018  . Major depressive disorder, recurrent episode, moderate ([place]) 04/17/2018  . Head injury 12/08/2017  . Cervical vertebral fusion 07/28/2017  . DDD (degenerative disc disease), cervical 07/10/2017  . DDD (degenerative disc disease), thoracic 07/10/2017  . DDD (degenerative disc disease), lumbar 07/10/2017  . Facet arthritis of cervical region 07/10/2017  . Abnormal liver function tests 03/07/2017  . Hypokalemia 03/07/2017  . Gastroesophageal reflux disease with esophagitis 03/07/2017  . Esophageal dysphagia 03/07/2017  . Chronic idiopathic constipation 02/01/2017  . Fatigue 02/01/2017   . HSV-1 infection 01/12/2017  . Osteoporosis with pathological fracture, with routine healing, subsequent encounter 08/30/2016  . MDD (major depressive disorder), recurrent episode, severe ([place]) 03/29/2016  . Estrogen deficiency 12/15/2015  . MS (multiple sclerosis) ([place]) 12/15/2015  . Tremor 12/15/2015  . Healthcare maintenance 12/15/2015  . Essential hypertension   . Protein-calorie malnutrition, severe 11/09/2015  . Bipolar 1 disorder, depressed ([place]) 02/11/2015    Standley Brooking, PTA 05/29/2019, 12:19 PM  [facility] [address] [place], [place], [address] Phone: [phone]   Fax:  [phone]  Name: Angel Maddox MRN: [id number] Date of Birth: 05/31/1956

## 2019-05-29 NOTE — Telephone Encounter (Signed)
Pt calling back.  Patient is now not getting any sleep due to coughing at night, and and getting really weak. Pt reporting she does not feel like sitting up. Please call pt back.

## 2019-05-29 NOTE — Telephone Encounter (Signed)
Pt states that since she started her new bp med she awakes during the night 4 to 5 times coughing, does not cough during the day. She feels she is getting weak, states her bp fluctuating hi to lo, she cannot give true bp readings because the amts are in the next room and she does not feel like getting them. States she is not sleeping at all. Refuses any appts says she is too busy to come in. She was advisedthat if she feels worse to call 911 or go to nearest [facility]. She states she does not want to do that either.  Please advise

## 2019-05-30 ENCOUNTER — Other Ambulatory Visit: Payer: Self-pay | Admitting: Internal Medicine

## 2019-05-30 ENCOUNTER — Ambulatory Visit (HOSPITAL_COMMUNITY): Admission: RE | Admit: 2019-05-30 | Payer: Medicare Other | Source: Ambulatory Visit

## 2019-05-30 NOTE — Telephone Encounter (Signed)
Thanks Lauren!

## 2019-05-30 NOTE — Telephone Encounter (Signed)
Returned call to patient. Gave all recommendations per Dr. Daryll Drown below. Patient has difficult time remembering the name of her meds but did state she did not take the lisinopril this AM. After several attempts was able to state correctly she will stop lisinopril and continue amlodipine 10 mg daily. Advised to wait at least 2 hours from when she takes amlodipine to when she checks BP. Instructed on best way to take BP. She will keep log and bring to appt on 06/17/2019 at 3:45. She is aware to call for sooner appt if BP > 180/90. States P has been in 70s and was concerned. Assured her this is a good HR. Hubbard Hartshorn, BSN, RN-BC

## 2019-05-30 NOTE — Telephone Encounter (Signed)
Pt is calling back [phone]

## 2019-05-30 NOTE — Telephone Encounter (Signed)
Advise to stop lisinopril and come in as planned.  Keep checking blood pressure and bring in blood pressure log to help assist with further planning.    Continue higher dose of amlodipine at 10mg .    If BP continues to range >180/90 at home, she will need to come in sooner.    Thanks Gilles Chiquito, MD

## 2019-05-31 ENCOUNTER — Encounter: Payer: Self-pay | Admitting: Physical Therapy

## 2019-05-31 ENCOUNTER — Other Ambulatory Visit: Payer: Self-pay

## 2019-05-31 ENCOUNTER — Ambulatory Visit: Payer: Medicare Other | Admitting: Physical Therapy

## 2019-05-31 DIAGNOSIS — R2681 Unsteadiness on feet: Secondary | ICD-10-CM

## 2019-05-31 DIAGNOSIS — M6281 Muscle weakness (generalized): Secondary | ICD-10-CM

## 2019-05-31 DIAGNOSIS — R260 Ataxic gait: Secondary | ICD-10-CM

## 2019-05-31 NOTE — Therapy (Signed)
[facility] [place], [place], [address] Phone: [phone]   Fax:  [phone]  Physical Therapy Treatment  Patient Details  Name: Angel Maddox MRN: [id number] Date of Birth: 07-17-1955 Referring Provider (PT): Delphia Grates, MD   Encounter Date: 05/31/2019  PT End of Session - 05/31/19 1119    Visit Number  7    Number of Visits  12    Date for PT Re-Evaluation  06/19/19    PT Start Time  1119    PT Stop Time  1200   limited by rest breaks and extended rest break for counseling   PT Time Calculation (min)  41 min    Equipment Utilized During Treatment  Other (comment)   Rollator   Activity Tolerance  Patient tolerated treatment well    Behavior During Therapy  [facility] for tasks assessed/performed       Past Medical History:  Diagnosis Date  . Anxiety   . Arthritis    osteo  . Bipolar 1 disorder ([place])   . Cervicalgia   . Chronic kidney disease    kidney stone  . COPD (chronic obstructive pulmonary disease) ([place])    new diagnosis- now sees pulmonologist  . Depression   . Elevated liver enzymes    She says "normal now"  . GERD (gastroesophageal reflux disease)   . H/O hiatal hernia   . Headache   . Hip fracture ([place]) 2017   left  . History of kidney stones    4-5 yrs ago  . Hypertension   . Multiple sclerosis ([place])     Had for 15 years  . Multiple sclerosis ([place])    dx 1999  . Tremors of nervous system     Past Surgical History:  Procedure Laterality Date  . ANTERIOR CERVICAL DECOMP/DISCECTOMY FUSION N/A 07/28/2017   Procedure: CERVICAL FOUR-FIVE, CERVICAL FIVE-SIX ANTERIOR CERVICAL DECOMPRESSION/DISCECTOMY FUSION;  Surgeon: Eustace Moore, MD;  Location: [facility];  Service: Neurosurgery;  Laterality: N/A;  . COLONOSCOPY  2008   Dr.Kaplan  . EYE SURGERY     [facility]  . HIP PINNING,CANNULATED Left 11/09/2015   Procedure: CANNULATED HIP PINNING;  Surgeon: Rod Can, MD;   [facility];  Service: Orthopedics;  Laterality: Left;  . LIPOMA EXCISION    . TUBAL LIGATION      There were no vitals filed for this visit.  Subjective Assessment - 05/31/19 1116    Subjective  COVID 19 screening performed on patient upon arrival. Patient reports that B hands feels numb, tingling and burning as well as B feet. Did not get to have infusion yesterday due to lack of avaliable nursing. Reports not feeling well and still fatigued. Reports 8/10 fatigue scale upon arrival.    Pertinent History  MS, tremors, HTN, COPD, bipolar disorder, lumbar DDD, cervical fusion, history of right hip fracture & pinning    Limitations  Standing;Walking;House hold activities    Patient Stated Goals  improve mobility and improve strength in UEs and LEs and function    Currently in Pain?  Yes    Pain Score  3     Pain Location  Back    Pain Orientation  Upper    Pain Descriptors / Indicators  Discomfort    Pain Type  Chronic pain    Pain Onset  More than a month ago    Pain Frequency  Intermittent    Multiple Pain Sites  Yes    Pain Score  6   4/10 B feet pain   Pain Location  Hand    Pain Orientation  Left;Right    Pain Descriptors / Indicators  Tingling;Burning    Pain Type  Chronic pain    Pain Onset  More than a month ago    Pain Frequency  Constant         [facility] PT Assessment - 05/31/19 0001      Assessment   Medical Diagnosis  Multiple sclerosis    Referring Provider (PT)  Delphia Grates, MD    Next MD Visit  Late November 2020    Prior Therapy  yes      Precautions   Precautions  Fall    Precaution Comments  Ataxic gait; close supervision to min/mod A for ambulation and transfers at all times                   [facility] Adult PT Treatment/Exercise - 05/31/19 0001      Knee/Hip Exercises: Seated   Long Arc Quad  AROM;Both;2 sets;10 reps    Marching  AROM;Both;2 sets;10 reps    Hamstring Curl  AROM;Both;2 sets;10 reps    Abduction/Adduction   AROM;Both;2  sets;10 reps      Shoulder Exercises: Seated   Diagonals  AROM;Both;20 reps    Other Seated Exercises  B W back 2x10 reps    Other Seated Exercises  X to V 2x10 reps                  PT Long Term Goals - 05/15/19 1122      PT LONG TERM GOAL #1   Title  Patient will be independent with HEP.    Time  6    Period  Weeks    Status  On-going      PT LONG TERM GOAL #2   Title  Patient will demonstrate modified 5x sit to stand test in 45 seconds or less to decrease fall risk and improve functional LE strength.    Period  Weeks    Status  On-going      PT LONG TERM GOAL #3   Title  Patient will ambulate 150 feet with rollator and close supervision to safely ambulate home.    Time  6    Period  Weeks    Status  On-going      PT LONG TERM GOAL #4   Title  Patient will demonstrate 4/5 or greater bilateral LE MMT to improve stablity during functional tasks.    Time  6    Period  Weeks    Status  On-going      PT LONG TERM GOAL #5   Title  Patient will report ability to perform ADLs with assistance with global pain less than or equal to 5/10.    Time  6    Period  Weeks    Status  On-going            Plan - 05/31/19 1222    Clinical Impression Statement  Patient presented in clinic with 8/10 fatigue and reports of B hand and foot pain along with burning and tingling. More mid back pain reported today as well upon arrival. Patient educated regarding new course for treatment with short reps and multiple sets to avoid over fatigue. Patient able to tolerate shorter reps and two sets well. Postural focus also provided with scapular strengthening as well. Patient encouraged to complete ankle pumps throughout the day to assist  with edema control in LEs. Extended rest break taken today for emotional support of patient. Patient able to ambulate after end of treatment with improved stability, less spasticity and safely with rollator. Patient ended treatment with 3/10 fatigue scale  rating.    Personal Factors and Comorbidities  Comorbidity 3+;Time since onset of injury/illness/exacerbation    Examination-Activity Limitations  Bed Mobility;Locomotion Level;Transfers;Hygiene/Grooming    Stability/Clinical Decision Making  Evolving/Moderate complexity    Rehab Potential  Fair    PT Frequency  2x / week    PT Duration  6 weeks    PT Treatment/Interventions  ADLs/Self Care Home Management;Cryotherapy;Electrical Stimulation;Moist Heat;Balance training;Therapeutic exercise;Therapeutic activities;Functional mobility training;Stair training;Gait training;Neuromuscular re-education;Patient/family education;Manual techniques;Passive range of motion    PT Next Visit Plan  Initate LE supine PNF patterns along with supine strengthening/AROM exercises.    PT Home Exercise Plan  see patient education section    Consulted and Agree with Plan of Care  Patient       Patient will benefit from skilled therapeutic intervention in order to improve the following deficits and impairments:  Abnormal gait, Decreased activity tolerance, Decreased balance, Pain, Impaired tone, Difficulty walking, Decreased strength, Decreased mobility, Increased muscle spasms  Visit Diagnosis: Unsteadiness on feet  Muscle weakness (generalized)  Ataxic gait     Problem List Patient Active Problem List   Diagnosis Date Noted  . 'light-for-dates' infant with signs of fetal malnutrition 05/06/2019  . Seizure-like activity ([place]) 12/27/2018  . Dyslipidemia 12/20/2018  . Hyponatremia syndrome 07/04/2018  . DOE (dyspnea on exertion) 07/03/2018  . COPD mixed type ([place]) 07/03/2018  . MDD (major depressive disorder) 06/28/2018  . GAD (generalized anxiety disorder) 06/28/2018  . Major depressive disorder, recurrent episode, moderate ([place]) 04/17/2018  . Head injury 12/08/2017  . Cervical vertebral fusion 07/28/2017  . DDD (degenerative disc disease), cervical 07/10/2017  . DDD (degenerative disc disease),  thoracic 07/10/2017  . DDD (degenerative disc disease), lumbar 07/10/2017  . Facet arthritis of cervical region 07/10/2017  . Abnormal liver function tests 03/07/2017  . Hypokalemia 03/07/2017  . Gastroesophageal reflux disease with esophagitis 03/07/2017  . Esophageal dysphagia 03/07/2017  . Chronic idiopathic constipation 02/01/2017  . Fatigue 02/01/2017  . HSV-1 infection 01/12/2017  . Osteoporosis with pathological fracture, with routine healing, subsequent encounter 08/30/2016  . MDD (major depressive disorder), recurrent episode, severe ([place]) 03/29/2016  . Estrogen deficiency 12/15/2015  . MS (multiple sclerosis) ([place]) 12/15/2015  . Tremor 12/15/2015  . Healthcare maintenance 12/15/2015  . Essential hypertension   . Protein-calorie malnutrition, severe 11/09/2015  . Bipolar 1 disorder, depressed ([place]) 02/11/2015    Standley Brooking, PTA 05/31/2019, 12:29 PM  [facility] [address] [place], [place], [address] Phone: [phone]   Fax:  [phone]  Name: Angel Maddox MRN: [id number] Date of Birth: 1956-03-22

## 2019-06-03 ENCOUNTER — Other Ambulatory Visit: Payer: Self-pay

## 2019-06-03 ENCOUNTER — Encounter (HOSPITAL_COMMUNITY): Payer: Self-pay

## 2019-06-03 ENCOUNTER — Encounter (HOSPITAL_COMMUNITY)
Admission: RE | Admit: 2019-06-03 | Discharge: 2019-06-03 | Disposition: A | Discharge status: Home / Self Care | Payer: Medicare Other | Source: Ambulatory Visit | Attending: Psychiatry | Admitting: Psychiatry

## 2019-06-03 DIAGNOSIS — G35 Multiple sclerosis: Secondary | ICD-10-CM | POA: Insufficient documentation

## 2019-06-03 MED ORDER — SODIUM CHLORIDE 0.9 % IV SOLN
1000.0000 mg | Freq: Once | INTRAVENOUS | Status: AC
  Administered 2019-06-03: 09:00:00 1000 mg via INTRAVENOUS
  Filled 2019-06-03: qty 100

## 2019-06-03 MED ORDER — ACETAMINOPHEN 500 MG PO TABS
1000.0000 mg | ORAL_TABLET | ORAL | Status: DC
  Administered 2019-06-03: 1000 mg via ORAL
  Filled 2019-06-03: qty 2

## 2019-06-03 MED ORDER — DIPHENHYDRAMINE HCL 50 MG/ML IJ SOLN
25.0000 mg | INTRAMUSCULAR | Status: DC
  Administered 2019-06-03: 08:00:00 25 mg via INTRAVENOUS
  Filled 2019-06-03: qty 1

## 2019-06-03 MED ORDER — SODIUM CHLORIDE 0.9 % IV SOLN
INTRAVENOUS | Status: DC
  Administered 2019-06-03: 08:00:00 via INTRAVENOUS

## 2019-06-03 MED ORDER — METHYLPREDNISOLONE SODIUM SUCC 125 MG IJ SOLR
125.0000 mg | INTRAMUSCULAR | Status: DC
  Administered 2019-06-03: 09:00:00 125 mg via INTRAVENOUS
  Filled 2019-06-03: qty 2

## 2019-06-05 ENCOUNTER — Encounter: Payer: Self-pay | Admitting: Physical Therapy

## 2019-06-05 ENCOUNTER — Other Ambulatory Visit: Payer: Self-pay

## 2019-06-05 ENCOUNTER — Ambulatory Visit: Payer: Medicare Other | Admitting: Physical Therapy

## 2019-06-05 DIAGNOSIS — R2681 Unsteadiness on feet: Secondary | ICD-10-CM | POA: Diagnosis not present

## 2019-06-05 DIAGNOSIS — R260 Ataxic gait: Secondary | ICD-10-CM

## 2019-06-05 DIAGNOSIS — M6281 Muscle weakness (generalized): Secondary | ICD-10-CM

## 2019-06-05 NOTE — Therapy (Signed)
[facility] [place], [place], [address] Phone: [phone]   Fax:  [phone]  Physical Therapy Treatment  Patient Details  Name: Angel Maddox MRN: [id number] Date of Birth: October 04, 1955 Referring Provider (PT): Delphia Grates, MD   Encounter Date: 06/05/2019  PT End of Session - 06/05/19 1035    Visit Number  8    Number of Visits  12    Date for PT Re-Evaluation  06/19/19    PT Start Time  [id number]    PT Stop Time  1115   limited by rest breaks   PT Time Calculation (min)  40 min    Equipment Utilized During Treatment  Other (comment)   Rollator   Activity Tolerance  Patient tolerated treatment well    Behavior During Therapy  [facility] for tasks assessed/performed       Past Medical History:  Diagnosis Date  . Anxiety   . Arthritis    osteo  . Bipolar 1 disorder ([place])   . Cervicalgia   . Chronic kidney disease    kidney stone  . COPD (chronic obstructive pulmonary disease) ([place])    new diagnosis- now sees pulmonologist  . Depression   . Elevated liver enzymes    She says "normal now"  . GERD (gastroesophageal reflux disease)   . H/O hiatal hernia   . Headache   . Hip fracture ([place]) 2017   left  . History of kidney stones    4-5 yrs ago  . Hypertension   . Multiple sclerosis ([place])     Had for 15 years  . Multiple sclerosis ([place])    dx 1999  . Tremors of nervous system     Past Surgical History:  Procedure Laterality Date  . ANTERIOR CERVICAL DECOMP/DISCECTOMY FUSION N/A 07/28/2017   Procedure: CERVICAL FOUR-FIVE, CERVICAL FIVE-SIX ANTERIOR CERVICAL DECOMPRESSION/DISCECTOMY FUSION;  Surgeon: Eustace Moore, MD;  Location: [facility];  Service: Neurosurgery;  Laterality: N/A;  . COLONOSCOPY  2008   Dr.Kaplan  . EYE SURGERY     [facility]  . HIP PINNING,CANNULATED Left 11/09/2015   Procedure: CANNULATED HIP PINNING;  Surgeon: Rod Can, MD;  [facility];  Service: Orthopedics;   Laterality: Left;  . LIPOMA EXCISION    . TUBAL LIGATION      There were no vitals filed for this visit.  Subjective Assessment - 06/05/19 1034    Subjective  COVID 19 screening performed on patient upon arrival. Reports that as good as she felt after last treatment, after lunchtime she began burning throughout her body from head to toe.    Pertinent History  MS, tremors, HTN, COPD, bipolar disorder, lumbar DDD, cervical fusion, history of right hip fracture & pinning    Limitations  Standing;Walking;House hold activities    Patient Stated Goals  improve mobility and improve strength in UEs and LEs and function    Currently in Pain?  No/denies         [facility] PT Assessment - 06/05/19 0001      Assessment   Medical Diagnosis  Multiple sclerosis    Referring Provider (PT)  Delphia Grates, MD    Next MD Visit  Late November 2020    Prior Therapy  yes      Precautions   Precautions  Fall    Precaution Comments  Ataxic gait; close supervision to min/mod A for ambulation and transfers at all times  [facility] Adult PT Treatment/Exercise - 06/05/19 0001      Knee/Hip Exercises: Seated   Long Arc Quad  AROM;Both;2 sets;10 reps    Marching  AROM;Both;2 sets;10 reps    Hamstring Curl  AROM;Both;2 sets;10 reps    Abduction/Adduction   AROM;Both;2 sets;10 reps      Knee/Hip Exercises: Supine   Other Supine Knee/Hip Exercises  B AROM/AAROM D2 2x5 reps      Shoulder Exercises: Supine   Horizontal ABduction  AROM;Both;10 reps    Flexion  AROM;Both;20 reps    Diagonals  AROM;Both;20 reps                  PT Long Term Goals - 05/15/19 1122      PT LONG TERM GOAL #1   Title  Patient will be independent with HEP.    Time  6    Period  Weeks    Status  On-going      PT LONG TERM GOAL #2   Title  Patient will demonstrate modified 5x sit to stand test in 45 seconds or less to decrease fall risk and improve functional LE strength.    Period  Weeks     Status  On-going      PT LONG TERM GOAL #3   Title  Patient will ambulate 150 feet with rollator and close supervision to safely ambulate home.    Time  6    Period  Weeks    Status  On-going      PT LONG TERM GOAL #4   Title  Patient will demonstrate 4/5 or greater bilateral LE MMT to improve stablity during functional tasks.    Time  6    Period  Weeks    Status  On-going      PT LONG TERM GOAL #5   Title  Patient will report ability to perform ADLs with assistance with global pain less than or equal to 5/10.    Time  6    Period  Weeks    Status  On-going            Plan - 06/05/19 1148    Clinical Impression Statement  Patient continues to tolerate slower approach to PT sessions. Continues to report burning of hands and feet although reaction last Friday was rare case for patient of whole body burning. Short reps and multiple sets completed throughout therex. New supine LE and UE D2 synergy patterns completed actively and with minimal assist from PTA. More fatigue reported with LLE D2 due to previous L hip fracture and ORIF. Patient able to ambulate at end of clinic feeling great with lack of involuntary movements or spasticity. Patient reported feeling "invigorated" at end of treatment.    Personal Factors and Comorbidities  Comorbidity 3+;Time since onset of injury/illness/exacerbation    Examination-Activity Limitations  Bed Mobility;Locomotion Level;Transfers;Hygiene/Grooming    Stability/Clinical Decision Making  Evolving/Moderate complexity    Rehab Potential  Fair    PT Frequency  2x / week    PT Duration  6 weeks    PT Treatment/Interventions  ADLs/Self Care Home Management;Cryotherapy;Electrical Stimulation;Moist Heat;Balance training;Therapeutic exercise;Therapeutic activities;Functional mobility training;Stair training;Gait training;Neuromuscular re-education;Patient/family education;Manual techniques;Passive range of motion    PT Next Visit Plan  Initate LE  supine PNF patterns along with supine strengthening/AROM exercises.    PT Home Exercise Plan  see patient education section    Consulted and Agree with Plan of Care  Patient       Patient  will benefit from skilled therapeutic intervention in order to improve the following deficits and impairments:  Abnormal gait, Decreased activity tolerance, Decreased balance, Pain, Impaired tone, Difficulty walking, Decreased strength, Decreased mobility, Increased muscle spasms  Visit Diagnosis: Unsteadiness on feet  Muscle weakness (generalized)  Ataxic gait     Problem List Patient Active Problem List   Diagnosis Date Noted  . 'light-for-dates' infant with signs of fetal malnutrition 05/06/2019  . Seizure-like activity ([place]) 12/27/2018  . Dyslipidemia 12/20/2018  . Hyponatremia syndrome 07/04/2018  . DOE (dyspnea on exertion) 07/03/2018  . COPD mixed type ([place]) 07/03/2018  . MDD (major depressive disorder) 06/28/2018  . GAD (generalized anxiety disorder) 06/28/2018  . Major depressive disorder, recurrent episode, moderate ([place]) 04/17/2018  . Head injury 12/08/2017  . Cervical vertebral fusion 07/28/2017  . DDD (degenerative disc disease), cervical 07/10/2017  . DDD (degenerative disc disease), thoracic 07/10/2017  . DDD (degenerative disc disease), lumbar 07/10/2017  . Facet arthritis of cervical region 07/10/2017  . Abnormal liver function tests 03/07/2017  . Hypokalemia 03/07/2017  . Gastroesophageal reflux disease with esophagitis 03/07/2017  . Esophageal dysphagia 03/07/2017  . Chronic idiopathic constipation 02/01/2017  . Fatigue 02/01/2017  . HSV-1 infection 01/12/2017  . Osteoporosis with pathological fracture, with routine healing, subsequent encounter 08/30/2016  . MDD (major depressive disorder), recurrent episode, severe ([place]) 03/29/2016  . Estrogen deficiency 12/15/2015  . MS (multiple sclerosis) ([place]) 12/15/2015  . Tremor 12/15/2015  . Healthcare maintenance  12/15/2015  . Essential hypertension   . Protein-calorie malnutrition, severe 11/09/2015  . Bipolar 1 disorder, depressed ([place]) 02/11/2015    Standley Brooking, PTA 06/05/2019, 11:52 AM  [facility] [address] [place], [place], [address] Phone: [phone]   Fax:  [phone]  Name: Angel Maddox MRN: [id number] Date of Birth: 10/24/1955

## 2019-06-06 ENCOUNTER — Encounter: Payer: Self-pay | Admitting: Physician Assistant

## 2019-06-06 ENCOUNTER — Ambulatory Visit (INDEPENDENT_AMBULATORY_CARE_PROVIDER_SITE_OTHER): Payer: Medicare Other | Admitting: Physician Assistant

## 2019-06-06 DIAGNOSIS — G35 Multiple sclerosis: Secondary | ICD-10-CM

## 2019-06-06 DIAGNOSIS — F411 Generalized anxiety disorder: Secondary | ICD-10-CM

## 2019-06-06 DIAGNOSIS — F331 Major depressive disorder, recurrent, moderate: Secondary | ICD-10-CM | POA: Diagnosis not present

## 2019-06-06 NOTE — Progress Notes (Signed)
[facility] Check  Patient ID: Angel Maddox,  MRN: [id number]  PCP: Delice Bison, DO  Date of Evaluation: 06/06/2019 Time spent:15 minutes  Chief Complaint:  Chief Complaint    Anxiety; Depression      HISTORY/CURRENT STATUS: HPI Hasn't been doing well but better now.  Her MS dr has started her on a few new meds.  She's walking better and doing better physically. Also in PT several days a week.   "I still feel depressed. Nobody calls me, nobody cares about me except [name].  He's the only one.  And I feel like he might be doing it out of guilt.  But I don't know." B/C she feels so bad physically, she has a hard time enjoying things. And can't do things that take any energy to do. Denies SI/HI.  Anxiety is bad at times. Says husband didn't speak to her for a month, and told her a few months ago, "why don't we split things up and you just leave. But later he said he was having a bad day.  I can't forget things like that."   No changes in musculoskeletal or neuro ROS. Still using walker.   Individual Medical History/ Review of Systems: Changes? :No    Past medications for mental health diagnoses include: Depakote, Lamictal, Risperdal, Celexa, Klonopin, trazodone, Wellbutrin, Vistaril, primidone, gabapentin, Prozac, Zyprexa, Ambien, Lunesta, Effexor  Allergies: Hydrocodone  Current Medications:  Current Outpatient Medications:  .  amLODipine (NORVASC) 10 MG tablet, Take 1 tablet (10 mg total) by mouth daily., Disp: 30 tablet, Rfl: 2 .  aspirin EC 81 MG EC tablet, Take 1 tablet (81 mg total) by mouth daily., Disp: 90 tablet, Rfl: 0 .  atorvastatin (LIPITOR) 40 MG tablet, Take 1 tablet (40 mg total) by mouth daily at 6 PM., Disp: 30 tablet, Rfl: 1 .  benzonatate (TESSALON) 200 MG capsule, Take 1 capsule (200 mg total) by mouth 2 (two) times daily as needed for cough., Disp: 20 capsule, Rfl: 0 .  buPROPion (WELLBUTRIN SR) 200 MG 12 hr tablet, Take 1 tablet (200 mg  total) by mouth 2 (two) times daily. (Patient taking differently: Take 200 mg by mouth daily. ), Disp: 60 tablet, Rfl: 5 .  citalopram (CELEXA) 40 MG tablet, Take 1 tablet (40 mg total) by mouth daily., Disp: 30 tablet, Rfl: 5 .  clonazePAM (KLONOPIN) 1 MG tablet, Take 1 mg by mouth at bedtime. , Disp: , Rfl:  .  dalfampridine 10 MG TB12, , Disp: , Rfl:  .  fluticasone furoate-vilanterol (BREO ELLIPTA) 100-25 MCG/INH AEPB, Inhale 1 puff then rinse mouth, once daily- maintenance, Disp: 60 each, Rfl: 12 .  fluticasone furoate-vilanterol (BREO ELLIPTA) 100-25 MCG/INH AEPB, Inhale 1 puff into the lungs daily., Disp: 1 each, Rfl: 0 .  ipratropium-albuterol (DUONEB) 0.5-2.5 (3) MG/3ML SOLN, 1 neb every 6 hours as needed for help breathing, Disp: 75 mL, Rfl: 12 .  mirtazapine (REMERON) 15 MG tablet, Take 0.5-1 tablets (7.5-15 mg total) by mouth at bedtime as needed. (Patient taking differently: Take 7.5-15 mg by mouth at bedtime as needed (sleep). ), Disp: 30 tablet, Rfl: 5 .  Nebulizers (COMPRESSOR/NEBULIZER) MISC, Use as directed, Disp: 1 each, Rfl: 0 .  omeprazole (PRILOSEC) 40 MG capsule, TAKE 1 CAPSULE BY MOUTH EVERY DAY, Disp: 90 capsule, Rfl: 1 .  ondansetron (ZOFRAN-ODT) 4 MG disintegrating tablet, TAKE 1 TABLET BY MOUTH EVERY 8 HOURS AS NEEDED FOR NAUSEA AND VOMITING, Disp: 30 tablet, Rfl: 2 .  Oxycodone  HCl 10 MG TABS, Take 1 tablet (10 mg total) by mouth every 6 (six) hours as needed ((score 7 to 10))., Disp: 120 tablet, Rfl: 0 .  primidone (MYSOLINE) 250 MG tablet, Take 250 mg by mouth 3 (three) times daily. , Disp: , Rfl:  .  PROAIR HFA 108 (90 Base) MCG/ACT inhaler, INHALE 2 PUFFS INTO THE LUNGS EVERY 4 (FOUR) HOURS AS NEEDED FOR WHEEZING OR SHORTNESS OF BREATH., Disp: 8.5 Inhaler, Rfl: 1 .  pseudoephedrine-acetaminophen (TYLENOL SINUS) 30-500 MG TABS tablet, Take 1 tablet by mouth daily as needed (pain)., Disp: , Rfl:  .  riTUXimab (RITUXAN) 100 MG/10ML injection, Inject into the vein., Disp:  , Rfl:  .  sucralfate (CARAFATE) 1 GM/10ML suspension, TAKE 10 MLS (1 G TOTAL) BY MOUTH EVERY 8 (EIGHT) HOURS AS NEEDED., Disp: 420 mL, Rfl: 3 .  tizanidine (ZANAFLEX) 2 MG capsule, Take 2 mg by mouth every 8 (eight) hours as needed for muscle spasms. , Disp: , Rfl:  .  tiZANidine (ZANAFLEX) 4 MG capsule, , Disp: , Rfl:  .  valACYclovir (VALTREX) 1000 MG tablet, TAKE 1 TABLET 2 TIMES DAILY. THEN STAY ON ONE TABLET DAILY AS PREVENTION, Disp: 40 tablet, Rfl: 5 .  baclofen (LIORESAL) 10 MG tablet, Take 10 mg by mouth 3 (three) times daily., Disp: , Rfl:  .  gabapentin (NEURONTIN) 300 MG capsule, Take 2 capsules (600 mg total) by mouth 3 (three) times daily. (Patient taking differently: Take 900 mg by mouth 4 (four) times daily. ), Disp: 180 capsule, Rfl: 0 .  lisinopril (ZESTRIL) 10 MG tablet, Take 1 tablet (10 mg total) by mouth daily. (Patient not taking: Reported on 06/06/2019), Disp: 30 tablet, Rfl: 0 Medication Side Effects: none  Family Medical/ Social History: Changes? No  MENTAL HEALTH EXAM:  There were no vitals taken for this visit.There is no height or weight on file to calculate BMI.  General Appearance: Casual, Neat and Well Groomed  Eye Contact:  Good  Speech:  Clear and Coherent  Volume:  Normal  Mood:  sad   Affect:  Tearful  Thought Process:  Goal Directed and Descriptions of Associations: Intact  Orientation:  Full (Time, Place, and Person)  Thought Content: Logical   Suicidal Thoughts:  No  Homicidal Thoughts:  No  Memory:  WNL  Judgement:  Good  Insight:  Good  Psychomotor Activity:  walking with rolling walker  Concentration:  Concentration: Good  Recall:  Good  Fund of Knowledge: Good  Language: Good  Assets:  Desire for Improvement  ADL's:  Impaired due to MS  Cognition: WNL  Prognosis:  Good    DIAGNOSES:    ICD-10-CM   1. Major depressive disorder, recurrent episode, moderate (HCC)  F33.1   2. Generalized anxiety disorder  F41.1   3. MS (multiple  sclerosis) ([place])  G35     Receiving Psychotherapy: Yes with [name], LCSW  RECOMMENDATIONS:  PDMP reviewed.  We had a long discussion about her medications.  Most of what she is going through is situational anxiety, depression, and grieving because of loss of bodily function due to the MS.  She will continue to work with [name], LCSW for behavioral modification which will hopefully help her mood.  I am also hoping that with the new medications the neurologist has her on, she will have more improvement in the MS which will help her mood tremendously.  She understands and agrees. Continue Klonopin 1 mg, 1 tid prn. Cont. Wellbutrin SR 200  mg bid. Cont Celexa 40 mg qd. Cont Mirtazepine 15 mg, 1/2-1 q hs prn. Cont Gabapentin 900 mg qid per MS dr. Return in 4 to 6 weeks.  Donnal Moat, PA-C

## 2019-06-07 ENCOUNTER — Other Ambulatory Visit: Payer: Self-pay

## 2019-06-07 ENCOUNTER — Other Ambulatory Visit: Payer: Self-pay | Admitting: Physician Assistant

## 2019-06-07 ENCOUNTER — Ambulatory Visit: Payer: Medicare Other | Admitting: Physical Therapy

## 2019-06-07 ENCOUNTER — Encounter: Payer: Self-pay | Admitting: Physical Therapy

## 2019-06-07 DIAGNOSIS — R260 Ataxic gait: Secondary | ICD-10-CM

## 2019-06-07 DIAGNOSIS — R2681 Unsteadiness on feet: Secondary | ICD-10-CM | POA: Diagnosis not present

## 2019-06-07 DIAGNOSIS — M6281 Muscle weakness (generalized): Secondary | ICD-10-CM

## 2019-06-07 NOTE — Therapy (Signed)
[facility] [place], [place], [address] Phone: [phone]   Fax:  [phone]  Physical Therapy Treatment  Patient Details  Name: Angel Maddox MRN: [id number] Date of Birth: March 02, 1956 Referring Provider (PT): Delphia Grates, MD   Encounter Date: 06/07/2019  PT End of Session - 06/07/19 1126    Visit Number  9    Number of Visits  12    Date for PT Re-Evaluation  06/19/19    PT Start Time  [id number]   limited by late arrival   PT Stop Time  1158    PT Time Calculation (min)  32 min    Equipment Utilized During Treatment  Other (comment)   Rollator   Activity Tolerance  Patient tolerated treatment well    Behavior During Therapy  [facility] for tasks assessed/performed       Past Medical History:  Diagnosis Date  . Anxiety   . Arthritis    osteo  . Bipolar 1 disorder ([place])   . Cervicalgia   . Chronic kidney disease    kidney stone  . COPD (chronic obstructive pulmonary disease) ([place])    new diagnosis- now sees pulmonologist  . Depression   . Elevated liver enzymes    She says "normal now"  . GERD (gastroesophageal reflux disease)   . H/O hiatal hernia   . Headache   . Hip fracture ([place]) 2017   left  . History of kidney stones    4-5 yrs ago  . Hypertension   . Multiple sclerosis ([place])     Had for 15 years  . Multiple sclerosis ([place])    dx 1999  . Tremors of nervous system     Past Surgical History:  Procedure Laterality Date  . ANTERIOR CERVICAL DECOMP/DISCECTOMY FUSION N/A 07/28/2017   Procedure: CERVICAL FOUR-FIVE, CERVICAL FIVE-SIX ANTERIOR CERVICAL DECOMPRESSION/DISCECTOMY FUSION;  Surgeon: Eustace Moore, MD;  Location: [facility];  Service: Neurosurgery;  Laterality: N/A;  . COLONOSCOPY  2008   Dr.Kaplan  . EYE SURGERY     [facility]  . HIP PINNING,CANNULATED Left 11/09/2015   Procedure: CANNULATED HIP PINNING;  Surgeon: Rod Can, MD;  [facility];  Service: Orthopedics;   Laterality: Left;  . LIPOMA EXCISION    . TUBAL LIGATION      There were no vitals filed for this visit.  Subjective Assessment - 06/07/19 1125    Subjective  COVID 19 screening performed on patient upon arrival. Patient reports wallking aorund [employer] yesterday and after walking so much reports that she has had excessive fatigue.    Pertinent History  MS, tremors, HTN, COPD, bipolar disorder, lumbar DDD, cervical fusion, history of right hip fracture & pinning    Limitations  Standing;Walking;House hold activities    Patient Stated Goals  improve mobility and improve strength in UEs and LEs and function    Currently in Pain?  No/denies         [facility] PT Assessment - 06/07/19 0001      Assessment   Medical Diagnosis  Multiple sclerosis    Referring Provider (PT)  Delphia Grates, MD    Next MD Visit  Late November 2020    Prior Therapy  yes      Precautions   Precautions  Fall    Precaution Comments  Ataxic gait; close supervision to min/mod A for ambulation and transfers at all times  [facility] Adult PT Treatment/Exercise - 06/07/19 0001      Knee/Hip Exercises: Seated   Long Arc Quad  AROM;Both;2 sets;10 reps    Marching  AROM;Both;2 sets;10 reps    Hamstring Curl  AROM;Both;2 sets;10 reps    Abduction/Adduction   AROM;Both;2 sets;10 reps      Knee/Hip Exercises: Supine   Other Supine Knee/Hip Exercises  B AROM D2 2x5 reps      Shoulder Exercises: Supine   Diagonals  AROM;10 reps      Shoulder Exercises: ROM/Strengthening   X to V Arms  supine x10 reps                  PT Long Term Goals - 05/15/19 1122      PT LONG TERM GOAL #1   Title  Patient will be independent with HEP.    Time  6    Period  Weeks    Status  On-going      PT LONG TERM GOAL #2   Title  Patient will demonstrate modified 5x sit to stand test in 45 seconds or less to decrease fall risk and improve functional LE strength.    Period  Weeks    Status   On-going      PT LONG TERM GOAL #3   Title  Patient will ambulate 150 feet with rollator and close supervision to safely ambulate home.    Time  6    Period  Weeks    Status  On-going      PT LONG TERM GOAL #4   Title  Patient will demonstrate 4/5 or greater bilateral LE MMT to improve stablity during functional tasks.    Time  6    Period  Weeks    Status  On-going      PT LONG TERM GOAL #5   Title  Patient will report ability to perform ADLs with assistance with global pain less than or equal to 5/10.    Time  6    Period  Weeks    Status  On-going            Plan - 06/07/19 1225    Clinical Impression Statement  Patient presented in clinic with continued excessive fatigue from shopping yesterday. Presented in clinic with reports of more weakness of RLE and inability to completely clear floor when ambulating. Patient able to complete all seated therex with short reps and multiple sets. Patient more independent with LE D2 exercises but continues to require VCs to correct technique. Patient reported ambulating better after treatment than prior to but as she arrived in waiting room RLE fatigue began increasing. Patient encouraged to rest today and also educated regarding energy conservation techniques such as using motorized cart in stores.    Personal Factors and Comorbidities  Comorbidity 3+;Time since onset of injury/illness/exacerbation    Examination-Activity Limitations  Bed Mobility;Locomotion Level;Transfers;Hygiene/Grooming    Stability/Clinical Decision Making  Evolving/Moderate complexity    Rehab Potential  Fair    PT Frequency  2x / week    PT Duration  6 weeks    PT Treatment/Interventions  ADLs/Self Care Home Management;Cryotherapy;Electrical Stimulation;Moist Heat;Balance training;Therapeutic exercise;Therapeutic activities;Functional mobility training;Stair training;Gait training;Neuromuscular re-education;Patient/family education;Manual techniques;Passive range of  motion    PT Next Visit Plan  Initate LE supine PNF patterns along with supine strengthening/AROM exercises.    PT Home Exercise Plan  see patient education section    Consulted and Agree with Plan of Care  Patient  Patient will benefit from skilled therapeutic intervention in order to improve the following deficits and impairments:  Abnormal gait, Decreased activity tolerance, Decreased balance, Pain, Impaired tone, Difficulty walking, Decreased strength, Decreased mobility, Increased muscle spasms  Visit Diagnosis: Unsteadiness on feet  Muscle weakness (generalized)  Ataxic gait     Problem List Patient Active Problem List   Diagnosis Date Noted  . 'light-for-dates' infant with signs of fetal malnutrition 05/06/2019  . Seizure-like activity ([place]) 12/27/2018  . Dyslipidemia 12/20/2018  . Hyponatremia syndrome 07/04/2018  . DOE (dyspnea on exertion) 07/03/2018  . COPD mixed type ([place]) 07/03/2018  . MDD (major depressive disorder) 06/28/2018  . GAD (generalized anxiety disorder) 06/28/2018  . Major depressive disorder, recurrent episode, moderate ([place]) 04/17/2018  . Head injury 12/08/2017  . Cervical vertebral fusion 07/28/2017  . DDD (degenerative disc disease), cervical 07/10/2017  . DDD (degenerative disc disease), thoracic 07/10/2017  . DDD (degenerative disc disease), lumbar 07/10/2017  . Facet arthritis of cervical region 07/10/2017  . Abnormal liver function tests 03/07/2017  . Hypokalemia 03/07/2017  . Gastroesophageal reflux disease with esophagitis 03/07/2017  . Esophageal dysphagia 03/07/2017  . Chronic idiopathic constipation 02/01/2017  . Fatigue 02/01/2017  . HSV-1 infection 01/12/2017  . Osteoporosis with pathological fracture, with routine healing, subsequent encounter 08/30/2016  . MDD (major depressive disorder), recurrent episode, severe ([place]) 03/29/2016  . Estrogen deficiency 12/15/2015  . MS (multiple sclerosis) ([place]) 12/15/2015  . Tremor  12/15/2015  . Healthcare maintenance 12/15/2015  . Essential hypertension   . Protein-calorie malnutrition, severe 11/09/2015  . Bipolar 1 disorder, depressed ([place]) 02/11/2015    Standley Brooking, PTA 06/07/2019, 12:32 PM  [facility] [address] [place], [place], [address] Phone: [phone]   Fax:  [phone]  Name: ATHERINE DUDEK MRN: [id number] Date of Birth: Nov 01, 1955

## 2019-06-11 ENCOUNTER — Ambulatory Visit: Payer: Medicare Other | Admitting: Physical Therapy

## 2019-06-11 ENCOUNTER — Ambulatory Visit (HOSPITAL_COMMUNITY): Payer: Medicare Other

## 2019-06-11 ENCOUNTER — Other Ambulatory Visit: Payer: Self-pay

## 2019-06-11 ENCOUNTER — Encounter: Payer: Self-pay | Admitting: Physical Therapy

## 2019-06-11 DIAGNOSIS — R2681 Unsteadiness on feet: Secondary | ICD-10-CM

## 2019-06-11 DIAGNOSIS — M6281 Muscle weakness (generalized): Secondary | ICD-10-CM

## 2019-06-11 DIAGNOSIS — R260 Ataxic gait: Secondary | ICD-10-CM

## 2019-06-11 NOTE — Therapy (Addendum)
Angel Maddox, [place], [address] Phone: [phone]   Fax:  [phone]  Physical Therapy Treatment Progress Note Reporting Period 05/01/2019 to 06/11/2019  See note below for Objective Data and Assessment of Progress/Goals. Patient's goals are ongoing at this time. Patient is limited with exercises due to fatigue and MS symptoms. Angel Maddox, PT, DPT     Patient Details  Name: Angel Maddox MRN: [id number] Date of Birth: October 16, 1955 Referring Provider (PT): Delphia Grates, MD   Encounter Date: 06/11/2019  PT End of Session - 06/11/19 1304    Visit Number  10    Number of Visits  12    Date for PT Re-Evaluation  06/19/19    PT Start Time  [id number]    PT Stop Time  [id number]   limited by fatigue   PT Time Calculation (min)  30 min    Equipment Utilized During Treatment  Other (comment)   Rollator   Activity Tolerance  Patient tolerated treatment well;Patient limited by fatigue;Patient limited by pain    Behavior During Therapy  [facility] for tasks assessed/performed       Past Medical History:  Diagnosis Date  . Anxiety   . Arthritis    osteo  . Bipolar 1 disorder ([place])   . Cervicalgia   . Chronic kidney disease    kidney stone  . COPD (chronic obstructive pulmonary disease) ([place])    new diagnosis- now sees pulmonologist  . Depression   . Elevated liver enzymes    She says "normal now"  . GERD (gastroesophageal reflux disease)   . H/O hiatal hernia   . Headache   . Hip fracture ([place]) 2017   left  . History of kidney stones    4-5 yrs ago  . Hypertension   . Multiple sclerosis ([place])     Had for 15 years  . Multiple sclerosis ([place])    dx 1999  . Tremors of nervous system     Past Surgical History:  Procedure Laterality Date  . ANTERIOR CERVICAL DECOMP/DISCECTOMY FUSION N/A 07/28/2017   Procedure: CERVICAL FOUR-FIVE, CERVICAL FIVE-SIX ANTERIOR CERVICAL DECOMPRESSION/DISCECTOMY FUSION;  Surgeon:  Eustace Moore, MD;  Location: [facility];  Service: Neurosurgery;  Laterality: N/A;  . COLONOSCOPY  2008   Dr.Kaplan  . EYE SURGERY     [facility]  . HIP PINNING,CANNULATED Left 11/09/2015   Procedure: CANNULATED HIP PINNING;  Surgeon: Rod Can, MD;  Location[facility];  Service: Orthopedics;  Laterality: Left;  . LIPOMA EXCISION    . TUBAL LIGATION      There were no vitals filed for this visit.  Subjective Assessment - 06/11/19 1303    Subjective  COVID 19 screening performed on patient upon arrival. Reports her knees have been buckling. Reports that she fell yesterday when trying to stand from rollator. Reports R knee pain ever since fall.    Pertinent History  MS, tremors, HTN, COPD, bipolar disorder, lumbar DDD, cervical fusion, history of right hip fracture & pinning    Limitations  Standing;Walking;House hold activities    Patient Stated Goals  improve mobility and improve strength in UEs and LEs and function    Currently in Pain?  Yes    Pain Score  6     Pain Location  Knee    Pain Orientation  Right    Pain Descriptors / Indicators  Discomfort    Pain Type  Chronic pain    Pain Onset  More than a month ago    Pain Frequency  Intermittent         [facility] PT Assessment - 06/11/19 0001      Assessment   Medical Diagnosis  Multiple sclerosis    Referring Provider (PT)  Delphia Grates, MD    Next MD Visit  Late November 2020    Prior Therapy  yes      Precautions   Precautions  Fall    Precaution Comments  Ataxic gait; close supervision to min/mod A for ambulation and transfers at all times                   [facility] Adult PT Treatment/Exercise - 06/11/19 0001      Knee/Hip Exercises: Stretches   Passive Hamstring Stretch  Right;Left;2 reps;30 seconds      Knee/Hip Exercises: Seated   Long Arc Quad  AROM;Both;2 sets;10 reps    Marching  AROM;Both;2 sets;10 reps    Hamstring Curl  AROM;Both;2 sets;10 reps    Abduction/Adduction    AROM;Both;2 sets;10 reps      Knee/Hip Exercises: Supine   Other Supine Knee/Hip Exercises  RLE AROM D2 x2 reps; 2x5 reps LLE AROM D2    stopped RLE due to increased spasticity     Shoulder Exercises: Supine   Horizontal ABduction  AROM;Both;20 reps    Flexion  AROM;Both;10 reps      Shoulder Exercises: ROM/Strengthening   X to V Arms  supine 2x10 reps                  PT Long Term Goals - 05/15/19 1122      PT LONG TERM GOAL #1   Title  Patient will be independent with HEP.    Time  6    Period  Weeks    Status  On-going      PT LONG TERM GOAL #2   Title  Patient will demonstrate modified 5x sit to stand test in 45 seconds or less to decrease fall risk and improve functional LE strength.    Period  Weeks    Status  On-going      PT LONG TERM GOAL #3   Title  Patient will ambulate 150 feet with rollator and close supervision to safely ambulate home.    Time  6    Period  Weeks    Status  On-going      PT LONG TERM GOAL #4   Title  Patient will demonstrate 4/5 or greater bilateral LE MMT to improve stablity during functional tasks.    Time  6    Period  Weeks    Status  On-going      PT LONG TERM GOAL #5   Title  Patient will report ability to perform ADLs with assistance with global pain less than or equal to 5/10.    Time  6    Period  Weeks    Status  On-going            Plan - 06/11/19 1402    Clinical Impression Statement  Patient presented in clinic with reports of a fall yesterday in which she landed directly on R knee. Patient's R patella notably tender upon palpation and pain with movement. Patient exhibited more RLE spasticity today and reported more calf tightness as well. Spasticity intensified after stretching of HS in sitting. Limited with D2 AROM in RLE due to increased spasticity as well. Patient able to ambulate fairly well  upon leaving for short duration and she began reporting RLE instability/shakiness again.    Personal Factors and  Comorbidities  Comorbidity 3+;Time since onset of injury/illness/exacerbation    Examination-Activity Limitations  Bed Mobility;Locomotion Level;Transfers;Hygiene/Grooming    Stability/Clinical Decision Making  Evolving/Moderate complexity    Rehab Potential  Fair    PT Frequency  2x / week    PT Duration  6 weeks    PT Treatment/Interventions  ADLs/Self Care Home Management;Cryotherapy;Electrical Stimulation;Moist Heat;Balance training;Therapeutic exercise;Therapeutic activities;Functional mobility training;Stair training;Gait training;Neuromuscular re-education;Patient/family education;Manual techniques;Passive range of motion    PT Next Visit Plan  Initate LE supine PNF patterns along with supine strengthening/AROM exercises.    PT Home Exercise Plan  see patient education section    Consulted and Agree with Plan of Care  Patient       Patient will benefit from skilled therapeutic intervention in order to improve the following deficits and impairments:  Abnormal gait, Decreased activity tolerance, Decreased balance, Pain, Impaired tone, Difficulty walking, Decreased strength, Decreased mobility, Increased muscle spasms  Visit Diagnosis: Unsteadiness on feet  Muscle weakness (generalized)  Ataxic gait     Problem List Patient Active Problem List   Diagnosis Date Noted  . 'light-for-dates' infant with signs of fetal malnutrition 05/06/2019  . Seizure-like activity ([place]) 12/27/2018  . Dyslipidemia 12/20/2018  . Hyponatremia syndrome 07/04/2018  . DOE (dyspnea on exertion) 07/03/2018  . COPD mixed type ([place]) 07/03/2018  . MDD (major depressive disorder) 06/28/2018  . GAD (generalized anxiety disorder) 06/28/2018  . Major depressive disorder, recurrent episode, moderate ([place]) 04/17/2018  . Head injury 12/08/2017  . Cervical vertebral fusion 07/28/2017  . DDD (degenerative disc disease), cervical 07/10/2017  . DDD (degenerative disc disease), thoracic 07/10/2017  . DDD  (degenerative disc disease), lumbar 07/10/2017  . Facet arthritis of cervical region 07/10/2017  . Abnormal liver function tests 03/07/2017  . Hypokalemia 03/07/2017  . Gastroesophageal reflux disease with esophagitis 03/07/2017  . Esophageal dysphagia 03/07/2017  . Chronic idiopathic constipation 02/01/2017  . Fatigue 02/01/2017  . HSV-1 infection 01/12/2017  . Osteoporosis with pathological fracture, with routine healing, subsequent encounter 08/30/2016  . MDD (major depressive disorder), recurrent episode, severe ([place]) 03/29/2016  . Estrogen deficiency 12/15/2015  . MS (multiple sclerosis) ([place]) 12/15/2015  . Tremor 12/15/2015  . Healthcare maintenance 12/15/2015  . Essential hypertension   . Protein-calorie malnutrition, severe 11/09/2015  . Bipolar 1 disorder, depressed ([place]) 02/11/2015    Standley Brooking, PTA 06/11/2019, 2:08 PM  [facility] [address] [place], [place], [address] Phone: [phone]   Fax:  [phone]  Name: Angel Maddox MRN: [id number] Date of Birth: 03-16-1956

## 2019-06-12 ENCOUNTER — Encounter: Payer: Self-pay | Admitting: Psychiatry

## 2019-06-12 ENCOUNTER — Ambulatory Visit (INDEPENDENT_AMBULATORY_CARE_PROVIDER_SITE_OTHER): Payer: Medicare Other | Admitting: Psychiatry

## 2019-06-12 DIAGNOSIS — F331 Major depressive disorder, recurrent, moderate: Secondary | ICD-10-CM

## 2019-06-12 NOTE — Progress Notes (Signed)
[facility] Counselor/Therapist Progress Note  Patient ID: TRIXIE CLYBURN, MRN: [id number],    Date: 06/12/2019  Time Spent: 60 minutes 11:00am to 12:00noon   Treatment Type: Individual Therapy  Reported Symptoms:  sad, depressed, angry,tearful, "feeling forgotten" by family members, frustrated  Mental Status Exam:  Appearance:   Neat     Behavior:  Appropriate and Sharing  Motor:  Normal  Speech/Language:   Normal Rate  Affect:  anxious, sad, depressed, tearful  Mood:  anxious, depressed and sad  Thought process:  goal directed  Thought content:    WNL  Sensory/Perceptual disturbances:    WNL  Orientation:  oriented to person, place, time/date, situation, day of week, month of year and year  Attention:  Good  Concentration:  Good  Memory:  WNL "at times and forgetfulness at other times"  Fund of knowledge:   Good  Insight:    Good "sometimes"  Judgment:   Good "usually"  Impulse Control:  Fair   Risk Assessment: Danger to Self:  None, denies current SI or HI. * Because she admitted to prior SI, I questioned her more thoroughly and she continued to deny current thoughts, intentions, or plans.  Did admit she would not want to do that to her grandchildren. Explained to her that if she had SI thoughts and needed care, she would need to go the hospital for evaluation and she understood.  Self-injurious Behavior: No Danger to Others: No Duty to Warn:no Physical Aggression / Violence:No  Access to Firearms a concern: No  Gang Involvement:No   Subjective: Patient struggling with some advancing MS symptoms and this is affecting her emotionally also.  Trying to manage the symptoms listed above and feeling anxious, depressed, and sad often, especially when feeling she is forgotten by some of her family.  Issues with other grandparents.  Interventions: Solution-Oriented/Positive Psychology and Ego-Supportive  Diagnosis:   ICD-10-CM   1. Major depressive disorder,  recurrent episode, moderate ([place])  F33.1      Plan: Patient not signing tx plan updates on computer screen due to [place].   Treatment Goals: Goals may remain on tx plan as patient works on strategies to meet her goals. Progress will be documented each session in "Progress" section.  Long term goal: Develop the ability to recognize, accept, and cope with feelings of depression.  Short term goal: Learn and implement calming skills to reduce overall tension and moments of increased anxiety and/or depression.Patient will recognize her negative self-talk, interrupt it, and not let it prevent her from trying new strategies to help herself.  Strategy: Teach patient cognitive and somatic calming skills . Rehearse with patient how to apply these skills to her daily life. Review and reinforce success while providing corrective feedback toward consistent implementation.  PROGRESS: Patient continues to work on her goals by addressing her sadness, anger and depression but has not been very successful in using some of the coping skills we have discussed in prior sessions.  Today she processed some of her struggles with depression, anger, sadness, and feeling hurt by some of her family.   Has had prior suicidal thoughts, but none currently.  Anger and frustration with family as she reports they don't ask about her MS condition and nor show concern when symptoms worsen.  Relationships with some family are up and down.  Lots of resentment and anger processed in session today and patient did state that she felt heard, and also was more calm and grounded at session  end. Good that she can vent openly and feel heard but it's difficult for her to follow up on strategies, and she is also limited with her MS symptoms.  Understandably she is trying to manage her frustrations and anger and does need to vent but am hoping sessions can be more balanced in allowing her to share and also arrive at some strategies  that are reasonable for her situation.  Goal review and progress/efforts noted with patient.   Next appt within 3 weeks.   [name], LCSW

## 2019-06-13 ENCOUNTER — Ambulatory Visit (HOSPITAL_COMMUNITY): Payer: Medicare Other

## 2019-06-14 ENCOUNTER — Other Ambulatory Visit: Payer: Self-pay

## 2019-06-14 ENCOUNTER — Encounter: Payer: Self-pay | Admitting: Physical Therapy

## 2019-06-14 ENCOUNTER — Ambulatory Visit: Payer: Medicare Other | Admitting: Physical Therapy

## 2019-06-14 DIAGNOSIS — M6281 Muscle weakness (generalized): Secondary | ICD-10-CM

## 2019-06-14 DIAGNOSIS — R2681 Unsteadiness on feet: Secondary | ICD-10-CM | POA: Diagnosis not present

## 2019-06-14 DIAGNOSIS — R260 Ataxic gait: Secondary | ICD-10-CM

## 2019-06-14 NOTE — Therapy (Signed)
[facility] [place], [place], [address] Phone: [phone]   Fax:  [phone]  Physical Therapy Treatment  Patient Details  Name: Angel Maddox MRN: [id number] Date of Birth: Oct 13, 1955 Referring Provider (PT): Delphia Grates, MD   Encounter Date: 06/14/2019  PT End of Session - 06/14/19 1123    Visit Number  11    Number of Visits  12    Date for PT Re-Evaluation  06/19/19    PT Start Time  1123    PT Stop Time  [id number]   limited by rest breaks   PT Time Calculation (min)  38 min    Equipment Utilized During Treatment  Other (comment)   Rollator   Activity Tolerance  Patient tolerated treatment well;Patient limited by fatigue    Behavior During Therapy  [facility] for tasks assessed/performed       Past Medical History:  Diagnosis Date  . Anxiety   . Arthritis    osteo  . Bipolar 1 disorder ([place])   . Cervicalgia   . Chronic kidney disease    kidney stone  . COPD (chronic obstructive pulmonary disease) ([place])    new diagnosis- now sees pulmonologist  . Depression   . Elevated liver enzymes    She says "normal now"  . GERD (gastroesophageal reflux disease)   . H/O hiatal hernia   . Headache   . Hip fracture ([place]) 2017   left  . History of kidney stones    4-5 yrs ago  . Hypertension   . Multiple sclerosis ([place])     Had for 15 years  . Multiple sclerosis ([place])    dx 1999  . Tremors of nervous system     Past Surgical History:  Procedure Laterality Date  . ANTERIOR CERVICAL DECOMP/DISCECTOMY FUSION N/A 07/28/2017   Procedure: CERVICAL FOUR-FIVE, CERVICAL FIVE-SIX ANTERIOR CERVICAL DECOMPRESSION/DISCECTOMY FUSION;  Surgeon: Eustace Moore, MD;  Location: [facility];  Service: Neurosurgery;  Laterality: N/A;  . COLONOSCOPY  2008   Dr.Kaplan  . EYE SURGERY      surgical center  . HIP PINNING,CANNULATED Left 11/09/2015   Procedure: CANNULATED HIP PINNING;  Surgeon: Rod Can, MD;  [facility];   Service: Orthopedics;  Laterality: Left;  . LIPOMA EXCISION    . TUBAL LIGATION      There were no vitals filed for this visit.  Subjective Assessment - 06/14/19 1121    Subjective  COVID 19 screening performed on patient upon arrival. Patient reports her daughter stressed her last night. Patient reports she is very fatigue this morning and her legs are dragging this morning. Reports 10/10 on fatigue scale upon arrival.    Pertinent History  MS, tremors, HTN, COPD, bipolar disorder, lumbar DDD, cervical fusion, history of right hip fracture & pinning    Limitations  Standing;Walking;House hold activities    Patient Stated Goals  improve mobility and improve strength in UEs and LEs and function    Currently in Pain?  No/denies         [facility] PT Assessment - 06/14/19 0001      Assessment   Medical Diagnosis  Multiple sclerosis    Referring Provider (PT)  Delphia Grates, MD    Next MD Visit  Late November 2020    Prior Therapy  yes      Precautions   Precautions  Fall    Precaution Comments  Ataxic gait; close supervision to min/mod A for ambulation and transfers at all  times                   [facility] Adult PT Treatment/Exercise - 06/14/19 0001      Knee/Hip Exercises: Seated   Long Arc Quad  AROM;Both;2 sets;10 reps    Ball Squeeze  x10 reps    Marching  AROM;Both;2 sets;10 reps    Hamstring Curl  AROM;Both;2 sets;10 reps    Abduction/Adduction   AROM;Both;2 sets;10 reps      Knee/Hip Exercises: Supine   Other Supine Knee/Hip Exercises  BLE AROM D2 2x5 reps      Shoulder Exercises: Supine   Flexion  AROM;Both;20 reps    Diagonals  AROM;Both;20 reps                  PT Long Term Goals - 05/15/19 1122      PT LONG TERM GOAL #1   Title  Patient will be independent with HEP.    Time  6    Period  Weeks    Status  On-going      PT LONG TERM GOAL #2   Title  Patient will demonstrate modified 5x sit to stand test in 45 seconds or less to decrease  fall risk and improve functional LE strength.    Period  Weeks    Status  On-going      PT LONG TERM GOAL #3   Title  Patient will ambulate 150 feet with rollator and close supervision to safely ambulate home.    Time  6    Period  Weeks    Status  On-going      PT LONG TERM GOAL #4   Title  Patient will demonstrate 4/5 or greater bilateral LE MMT to improve stablity during functional tasks.    Time  6    Period  Weeks    Status  On-going      PT LONG TERM GOAL #5   Title  Patient will report ability to perform ADLs with assistance with global pain less than or equal to 5/10.    Time  6    Period  Weeks    Status  On-going            Plan - 06/14/19 1212    Clinical Impression Statement  Patient presented in clinic with reports of extreme fatigue after stressful conversation. Patient ambulating with very ataxic deviations upon entrance in clinic gym. Patient continues to require intermitant rest breaks and less reps. Patient has less LBP reports since beginning supine UE exercises for posture focus. Patient able to complete D2 AROM in supine much better today with less spasticity. Patient introduced to bridges today for LE weightbearing with no complaints of LBP. Patient able to ambulate out of clinic gym with less ataxia at end of treatment.    Personal Factors and Comorbidities  Comorbidity 3+;Time since onset of injury/illness/exacerbation    Examination-Activity Limitations  Bed Mobility;Locomotion Level;Transfers;Hygiene/Grooming    Stability/Clinical Decision Making  Evolving/Moderate complexity    Rehab Potential  Fair    PT Frequency  2x / week    PT Duration  6 weeks    PT Treatment/Interventions  ADLs/Self Care Home Management;Cryotherapy;Electrical Stimulation;Moist Heat;Balance training;Therapeutic exercise;Therapeutic activities;Functional mobility training;Stair training;Gait training;Neuromuscular re-education;Patient/family education;Manual techniques;Passive  range of motion    PT Next Visit Plan  Initate LE supine PNF patterns along with supine strengthening/AROM exercises.    PT Home Exercise Plan  see patient education section    Consulted and  Agree with Plan of Care  Patient       Patient will benefit from skilled therapeutic intervention in order to improve the following deficits and impairments:  Abnormal gait, Decreased activity tolerance, Decreased balance, Pain, Impaired tone, Difficulty walking, Decreased strength, Decreased mobility, Increased muscle spasms  Visit Diagnosis: Unsteadiness on feet  Muscle weakness (generalized)  Ataxic gait     Problem List Patient Active Problem List   Diagnosis Date Noted  . 'light-for-dates' infant with signs of fetal malnutrition 05/06/2019  . Seizure-like activity ([place]) 12/27/2018  . Dyslipidemia 12/20/2018  . Hyponatremia syndrome 07/04/2018  . DOE (dyspnea on exertion) 07/03/2018  . COPD mixed type ([place]) 07/03/2018  . MDD (major depressive disorder) 06/28/2018  . GAD (generalized anxiety disorder) 06/28/2018  . Major depressive disorder, recurrent episode, moderate ([place]) 04/17/2018  . Head injury 12/08/2017  . Cervical vertebral fusion 07/28/2017  . DDD (degenerative disc disease), cervical 07/10/2017  . DDD (degenerative disc disease), thoracic 07/10/2017  . DDD (degenerative disc disease), lumbar 07/10/2017  . Facet arthritis of cervical region 07/10/2017  . Abnormal liver function tests 03/07/2017  . Hypokalemia 03/07/2017  . Gastroesophageal reflux disease with esophagitis 03/07/2017  . Esophageal dysphagia 03/07/2017  . Chronic idiopathic constipation 02/01/2017  . Fatigue 02/01/2017  . HSV-1 infection 01/12/2017  . Osteoporosis with pathological fracture, with routine healing, subsequent encounter 08/30/2016  . MDD (major depressive disorder), recurrent episode, severe ([place]) 03/29/2016  . Estrogen deficiency 12/15/2015  . MS (multiple sclerosis) ([place]) 12/15/2015  .  Tremor 12/15/2015  . Healthcare maintenance 12/15/2015  . Essential hypertension   . Protein-calorie malnutrition, severe 11/09/2015  . Bipolar 1 disorder, depressed ([place]) 02/11/2015    Standley Brooking, PTA 06/14/2019, 12:34 PM  [facility] [address] [place], [place], [address] Phone: [phone]   Fax:  [phone]  Name: Angel Maddox MRN: [id number] Date of Birth: Sep 23, 1955

## 2019-06-17 ENCOUNTER — Ambulatory Visit (INDEPENDENT_AMBULATORY_CARE_PROVIDER_SITE_OTHER): Payer: Medicare Other | Admitting: Internal Medicine

## 2019-06-17 ENCOUNTER — Other Ambulatory Visit: Payer: Self-pay

## 2019-06-17 ENCOUNTER — Ambulatory Visit: Payer: Medicare Other

## 2019-06-17 ENCOUNTER — Ambulatory Visit: Payer: Medicare Other | Attending: Internal Medicine

## 2019-06-17 ENCOUNTER — Telehealth: Payer: Self-pay

## 2019-06-17 ENCOUNTER — Encounter: Payer: Self-pay | Admitting: Internal Medicine

## 2019-06-17 VITALS — BP 142/83 | HR 79 | Temp 99.6°F | Ht 67.0 in | Wt 143.2 lb

## 2019-06-17 DIAGNOSIS — I10 Essential (primary) hypertension: Secondary | ICD-10-CM | POA: Diagnosis not present

## 2019-06-17 DIAGNOSIS — Z79899 Other long term (current) drug therapy: Secondary | ICD-10-CM

## 2019-06-17 DIAGNOSIS — Z20822 Contact with and (suspected) exposure to covid-19: Secondary | ICD-10-CM

## 2019-06-17 DIAGNOSIS — K5909 Other constipation: Secondary | ICD-10-CM | POA: Diagnosis not present

## 2019-06-17 DIAGNOSIS — E785 Hyperlipidemia, unspecified: Secondary | ICD-10-CM

## 2019-06-17 DIAGNOSIS — F332 Major depressive disorder, recurrent severe without psychotic features: Secondary | ICD-10-CM | POA: Diagnosis not present

## 2019-06-17 MED ORDER — LOSARTAN POTASSIUM 25 MG PO TABS: 25.0000 mg | ORAL_TABLET | Freq: Every day | ORAL | 0 refills | Status: DC

## 2019-06-17 NOTE — Telephone Encounter (Signed)
Changed pt password per pt request. Sent message through [place] with MyChart help desk number [phone].

## 2019-06-17 NOTE — Assessment & Plan Note (Signed)
Patient with known hyperlipidemia. She was recently started on atorvastatin 40 mg once daily. She has been on this medication for approximately six months. We will recheck lipid panel in four weeks when she comes in to get a BMP checked. In the meantime we will continue atorvastatin 40 mg once daily.

## 2019-06-17 NOTE — Assessment & Plan Note (Signed)
Patient with known MDD. She is currently on Wellbutrin and Celexa. She is tolerating both these medications well. Her PHQ nine is 18 which is unchanged compared to prior. She follows with both psychiatry and psychology. She follows up for CBT every 2 to 3 weeks. She is not had any other changes in medication. We will continue Wellbutrin 200 mg once daily and Celexa 20 mg once daily.

## 2019-06-17 NOTE — Progress Notes (Signed)
Internal Medicine Clinic Attending  Case discussed with Dr. Helberg at the time of the visit.  We reviewed the resident's history and exam and pertinent patient test results.  I agree with the assessment, diagnosis, and plan of care documented in the resident's note.    

## 2019-06-17 NOTE — Progress Notes (Signed)
   CC: Hypertension  HPI:  [name] is a 63 y.o. female with PMHx listed below presenting for hypertension. Please see the A&P for the status of the patient's chronic medical problems.  Past Medical History:  Diagnosis Date  . Anxiety   . Arthritis    osteo  . Bipolar 1 disorder ([place])   . Cervicalgia   . Chronic kidney disease    kidney stone  . COPD (chronic obstructive pulmonary disease) ([place])    new diagnosis- now sees pulmonologist  . Depression   . Elevated liver enzymes    She says "normal now"  . GERD (gastroesophageal reflux disease)   . H/O hiatal hernia   . Headache   . Hip fracture ([place]) 2017   left  . History of kidney stones    4-5 yrs ago  . Hypertension   . Multiple sclerosis ([place])     Had for 15 years  . Multiple sclerosis ([place])    dx 1999  . Tremors of nervous system    Review of Systems:  Performed and all others negative.  Physical Exam: Vitals:   06/17/19 1347  BP: (!) 142/83  Pulse: 79  Temp: 99.6 F (37.6 C)  TempSrc: Oral  SpO2: 97%  Weight: 143 lb 3.2 oz (65 kg)  Height: 5\' 7"  (1.702 m)   General: Well nourished female in no acute distress Pulm: Good air movement with no wheezing or crackles  CV: RRR, no murmurs, no rubs   Assessment & Plan:   See Encounters Tab for problem based charting.  Patient discussed with Dr. Evette Doffing

## 2019-06-17 NOTE — Patient Instructions (Addendum)
Thank you for allowing [place] to provide your care. Today we are starting you on a medication called Losartan for your blood pressure. Please continue to check it once daily. Come back in 4 weeks for additional labs.   Please come back to see Dr. Koleen Distance in 3 months or sooner if any issues arise.

## 2019-06-17 NOTE — Assessment & Plan Note (Signed)
Patient presents for continued evaluation and management of her hypertension. She was last seen on 11/14. Home records indicated persistently elevated blood pressure. Her amlodipine was increased from 5 to 10 mg once daily and she was started on low-dose lisinopril. Shortly after starting lisinopril she developed a dry cough. It was subsequently discontinued at the beginning of this month. Since discontinuing the lisinopril she states her average blood pressure at home is 150/90. She is not noticed any worsening of her chronic constipation or lower extremity edema with increase in amlodipine. She denies orthostatic symptoms.  A/P: - Continue Amlodipine 10 mg QD - Start Losartan 25 mg QD. Advised to monitor for orthostatic symptoms.  - Check BMP in 4 weeks.

## 2019-06-18 ENCOUNTER — Encounter (HOSPITAL_COMMUNITY): Payer: Self-pay

## 2019-06-18 ENCOUNTER — Encounter (HOSPITAL_COMMUNITY)
Admission: RE | Admit: 2019-06-18 | Discharge: 2019-06-18 | Disposition: A | Discharge status: Home / Self Care | Payer: Medicare Other | Source: Ambulatory Visit | Attending: Psychiatry | Admitting: Psychiatry

## 2019-06-18 DIAGNOSIS — G35 Multiple sclerosis: Secondary | ICD-10-CM | POA: Insufficient documentation

## 2019-06-18 LAB — NOVEL CORONAVIRUS, NAA: SARS-CoV-2, NAA: NOT DETECTED

## 2019-06-18 MED ORDER — ACETAMINOPHEN 500 MG PO TABS
1000.0000 mg | ORAL_TABLET | ORAL | Status: AC
  Administered 2019-06-18: 1000 mg via ORAL
  Filled 2019-06-18: qty 2

## 2019-06-18 MED ORDER — DIPHENHYDRAMINE HCL 50 MG/ML IJ SOLN
25.0000 mg | INTRAMUSCULAR | Status: AC
  Administered 2019-06-18: 25 mg via INTRAVENOUS
  Filled 2019-06-18: qty 1

## 2019-06-18 MED ORDER — SODIUM CHLORIDE 0.9 % IV SOLN
1000.0000 mg | Freq: Once | INTRAVENOUS | Status: AC
  Administered 2019-06-18: 1000 mg via INTRAVENOUS
  Filled 2019-06-18: qty 100

## 2019-06-18 MED ORDER — SODIUM CHLORIDE 0.9 % IV SOLN: INTRAVENOUS | Status: AC

## 2019-06-18 MED ORDER — METHYLPREDNISOLONE SODIUM SUCC 125 MG IJ SOLR
125.0000 mg | INTRAMUSCULAR | Status: AC
  Administered 2019-06-18: 125 mg via INTRAVENOUS
  Filled 2019-06-18: qty 2

## 2019-06-20 ENCOUNTER — Encounter (HOSPITAL_COMMUNITY): Payer: Medicare Other

## 2019-06-24 ENCOUNTER — Ambulatory Visit: Payer: Medicare Other | Admitting: Physical Therapy

## 2019-06-26 ENCOUNTER — Ambulatory Visit: Payer: Medicare Other | Admitting: Physical Therapy

## 2019-06-27 ENCOUNTER — Other Ambulatory Visit: Payer: Self-pay | Admitting: Internal Medicine

## 2019-07-03 ENCOUNTER — Other Ambulatory Visit: Payer: Self-pay

## 2019-07-03 ENCOUNTER — Ambulatory Visit: Payer: Medicare PPO | Attending: Psychiatry | Admitting: Physical Therapy

## 2019-07-03 ENCOUNTER — Encounter: Payer: Self-pay | Admitting: Physical Therapy

## 2019-07-03 DIAGNOSIS — R260 Ataxic gait: Secondary | ICD-10-CM | POA: Insufficient documentation

## 2019-07-03 DIAGNOSIS — M6281 Muscle weakness (generalized): Secondary | ICD-10-CM | POA: Diagnosis present

## 2019-07-03 DIAGNOSIS — R2681 Unsteadiness on feet: Secondary | ICD-10-CM | POA: Diagnosis not present

## 2019-07-03 NOTE — Therapy (Signed)
[facility] [place], [place], [address] Phone: [phone]   Fax:  [phone]  Physical Therapy Treatment  Patient Details  Name: Angel Maddox MRN: [id number] Date of Birth: 1956/06/18 Referring Provider (PT): Delphia Grates, MD   Encounter Date: 07/03/2019  PT End of Session - 07/03/19 1122    Visit Number  12    Number of Visits  16    Date for PT Re-Evaluation  07/26/19    PT Start Time  [id number]    PT Stop Time  [id number]   limited by fatigue and weakness   PT Time Calculation (min)  25 min    Equipment Utilized During Treatment  Other (comment)   FWW   Activity Tolerance  Patient limited by fatigue    Behavior During Therapy  [facility] for tasks assessed/performed       Past Medical History:  Diagnosis Date  . Anxiety   . Arthritis    osteo  . Bipolar 1 disorder ([place])   . Cervicalgia   . Chronic kidney disease    kidney stone  . COPD (chronic obstructive pulmonary disease) ([place])    new diagnosis- now sees pulmonologist  . Depression   . Elevated liver enzymes    She says "normal now"  . GERD (gastroesophageal reflux disease)   . H/O hiatal hernia   . Headache   . Hip fracture ([place]) 2017   left  . History of kidney stones    4-5 yrs ago  . Hypertension   . Multiple sclerosis ([place])     Had for 15 years  . Multiple sclerosis ([place])    dx 1999  . Tremors of nervous system     Past Surgical History:  Procedure Laterality Date  . ANTERIOR CERVICAL DECOMP/DISCECTOMY FUSION N/A 07/28/2017   Procedure: CERVICAL FOUR-FIVE, CERVICAL FIVE-SIX ANTERIOR CERVICAL DECOMPRESSION/DISCECTOMY FUSION;  Surgeon: Eustace Moore, MD;  Location: [facility];  Service: Neurosurgery;  Laterality: N/A;  . COLONOSCOPY  2008   Dr.Kaplan  . EYE SURGERY     [facility]  . HIP PINNING,CANNULATED Left 11/09/2015   Procedure: CANNULATED HIP PINNING;  Surgeon: Rod Can, MD;  Location: [facility];  Service: Orthopedics;   Laterality: Left;  . LIPOMA EXCISION    . TUBAL LIGATION      There were no vitals filed for this visit.  Subjective Assessment - 07/03/19 1119    Subjective  COVID 19 screening performed on patient upon arrival. Reports 3 falls within the last 2 weeks and had to cancel PT visits last week due to buttock pain from falls. Reports she may have hit her head in one of the falls as the beautician was massaging her scalp and it was sore. Knot on her head is on L side of her skull.    Pertinent History  MS, tremors, HTN, COPD, bipolar disorder, lumbar DDD, cervical fusion, history of right hip fracture & pinning    Limitations  Standing;Walking;House hold activities    Patient Stated Goals  improve mobility and improve strength in UEs and LEs and function    Currently in Pain?  Yes    Pain Score  6     Pain Location  Buttocks    Pain Descriptors / Indicators  Discomfort    Pain Type  Acute pain    Pain Onset  More than a month ago    Pain Frequency  Constant         [facility] PT  Assessment - 07/03/19 0001      Assessment   Medical Diagnosis  Multiple sclerosis    Referring Provider (PT)  Delphia Grates, MD    Next MD Visit  Late November 2020    Prior Therapy  yes      Precautions   Precautions  Fall    Precaution Comments  Ataxic gait; close supervision to min/mod A for ambulation and transfers at all times                   [facility] Adult PT Treatment/Exercise - 07/03/19 0001      Knee/Hip Exercises: Seated   Long Arc Quad  AROM;Both;2 sets;5 reps    Other Seated Knee/Hip Exercises  B LE D2 in sitting 1x5 reps    Marching  AROM;Both;2 sets;5 reps    Hamstring Curl  AROM;Both;2 sets;5 reps    Abduction/Adduction   AROM;Both;2 sets;5 reps                  PT Long Term Goals - 05/15/19 1122      PT LONG TERM GOAL #1   Title  Patient will be independent with HEP.    Time  6    Period  Weeks    Status  On-going      PT LONG TERM GOAL #2   Title  Patient  will demonstrate modified 5x sit to stand test in 45 seconds or less to decrease fall risk and improve functional LE strength.    Period  Weeks    Status  On-going      PT LONG TERM GOAL #3   Title  Patient will ambulate 150 feet with rollator and close supervision to safely ambulate home.    Time  6    Period  Weeks    Status  On-going      PT LONG TERM GOAL #4   Title  Patient will demonstrate 4/5 or greater bilateral LE MMT to improve stablity during functional tasks.    Time  6    Period  Weeks    Status  On-going      PT LONG TERM GOAL #5   Title  Patient will report ability to perform ADLs with assistance with global pain less than or equal to 5/10.    Time  6    Period  Weeks    Status  On-going            Plan - 07/03/19 1200    Clinical Impression Statement  Patient presented in clinic with reports of three falls within her home and potentially hitting her head as she has a tender knot on L side of her skull. Patient also reported thinking "slowly" while in PT clinic. Along with recent falls, stressful family situations have occured in recent weeks. Patient able to complete light AROM LE strengthening with intermittant spasticity occuring in which she required min assist to reduce. Spasticity increasing with fatigue. Patient reported feeling generally weak. Patient able to ambulate fairly well upon end of treatment although at slower pace which was encouraged by PTA for safety.    Personal Factors and Comorbidities  Comorbidity 3+;Time since onset of injury/illness/exacerbation    Examination-Activity Limitations  Bed Mobility;Locomotion Level;Transfers;Hygiene/Grooming    Stability/Clinical Decision Making  Evolving/Moderate complexity    Rehab Potential  Fair    PT Frequency  2x / week    PT Duration  6 weeks    PT Treatment/Interventions  ADLs/Self Care Home  Management;Cryotherapy;Electrical Stimulation;Moist Heat;Balance training;Therapeutic exercise;Therapeutic  activities;Functional mobility training;Stair training;Gait training;Neuromuscular re-education;Patient/family education;Manual techniques;Passive range of motion    PT Next Visit Plan  Initate LE supine PNF patterns along with supine strengthening/AROM exercises.    PT Home Exercise Plan  see patient education section    Consulted and Agree with Plan of Care  Patient       Patient will benefit from skilled therapeutic intervention in order to improve the following deficits and impairments:  Abnormal gait, Decreased activity tolerance, Decreased balance, Pain, Impaired tone, Difficulty walking, Decreased strength, Decreased mobility, Increased muscle spasms  Visit Diagnosis: Unsteadiness on feet  Muscle weakness (generalized)  Ataxic gait     Problem List Patient Active Problem List   Diagnosis Date Noted  . 'light-for-dates' infant with signs of fetal malnutrition 05/06/2019  . Seizure-like activity ([place]) 12/27/2018  . Dyslipidemia 12/20/2018  . Hyponatremia syndrome 07/04/2018  . DOE (dyspnea on exertion) 07/03/2018  . COPD mixed type ([place]) 07/03/2018  . MDD (major depressive disorder) 06/28/2018  . GAD (generalized anxiety disorder) 06/28/2018  . Major depressive disorder, recurrent episode, moderate ([place]) 04/17/2018  . Head injury 12/08/2017  . Cervical vertebral fusion 07/28/2017  . DDD (degenerative disc disease), cervical 07/10/2017  . DDD (degenerative disc disease), thoracic 07/10/2017  . DDD (degenerative disc disease), lumbar 07/10/2017  . Facet arthritis of cervical region 07/10/2017  . Abnormal liver function tests 03/07/2017  . Hypokalemia 03/07/2017  . Gastroesophageal reflux disease with esophagitis 03/07/2017  . Esophageal dysphagia 03/07/2017  . Chronic idiopathic constipation 02/01/2017  . Fatigue 02/01/2017  . HSV-1 infection 01/12/2017  . Osteoporosis with pathological fracture, with routine healing, subsequent encounter 08/30/2016  . MDD (major  depressive disorder), recurrent episode, severe ([place]) 03/29/2016  . Estrogen deficiency 12/15/2015  . MS (multiple sclerosis) ([place]) 12/15/2015  . Tremor 12/15/2015  . Healthcare maintenance 12/15/2015  . Essential hypertension   . Protein-calorie malnutrition, severe 11/09/2015  . Bipolar 1 disorder, depressed ([place]) 02/11/2015    Standley Brooking, PTA 07/03/19 9:41 PM   [facility] [address] [place], [place], [address] Phone: [phone]   Fax:  [phone]  Name: Angel Maddox MRN: [id number] Date of Birth: [birth year]

## 2019-07-05 ENCOUNTER — Other Ambulatory Visit: Payer: Self-pay

## 2019-07-05 ENCOUNTER — Encounter: Payer: Self-pay | Admitting: Physical Therapy

## 2019-07-05 ENCOUNTER — Other Ambulatory Visit: Payer: Self-pay | Admitting: Internal Medicine

## 2019-07-05 ENCOUNTER — Ambulatory Visit: Payer: Medicare PPO | Admitting: Physical Therapy

## 2019-07-05 DIAGNOSIS — R2681 Unsteadiness on feet: Secondary | ICD-10-CM

## 2019-07-05 DIAGNOSIS — R260 Ataxic gait: Secondary | ICD-10-CM

## 2019-07-05 DIAGNOSIS — M6281 Muscle weakness (generalized): Secondary | ICD-10-CM

## 2019-07-05 NOTE — Therapy (Signed)
[facility] [place], [place], [address] Phone: [phone]   Fax:  [phone]  Physical Therapy Treatment  Patient Details  Name: Angel Maddox MRN: [id number] Date of Birth: 11/15/1955 Referring Provider (PT): Delphia Grates, MD   Encounter Date: 07/05/2019  PT End of Session - 07/05/19 1121    Visit Number  13    Number of Visits  16    Date for PT Re-Evaluation  07/26/19    PT Start Time  [id number]    PT Stop Time  1152    PT Time Calculation (min)  31 min    Equipment Utilized During Treatment  Other (comment)   Rollator   Activity Tolerance  Patient limited by fatigue;Patient limited by pain    Behavior During Therapy  [facility] for tasks assessed/performed       Past Medical History:  Diagnosis Date  . Anxiety   . Arthritis    osteo  . Bipolar 1 disorder ([place])   . Cervicalgia   . Chronic kidney disease    kidney stone  . COPD (chronic obstructive pulmonary disease) ([place])    new diagnosis- now sees pulmonologist  . Depression   . Elevated liver enzymes    She says "normal now"  . GERD (gastroesophageal reflux disease)   . H/O hiatal hernia   . Headache   . Hip fracture ([place]) 2017   left  . History of kidney stones    4-5 yrs ago  . Hypertension   . Multiple sclerosis ([place])     Had for 15 years  . Multiple sclerosis ([place])    dx 1999  . Tremors of nervous system     Past Surgical History:  Procedure Laterality Date  . ANTERIOR CERVICAL DECOMP/DISCECTOMY FUSION N/A 07/28/2017   Procedure: CERVICAL FOUR-FIVE, CERVICAL FIVE-SIX ANTERIOR CERVICAL DECOMPRESSION/DISCECTOMY FUSION;  Surgeon: Eustace Moore, MD;  Location: [facility];  Service: Neurosurgery;  Laterality: N/A;  . COLONOSCOPY  2008   Dr.Kaplan  . EYE SURGERY     [facility]  . HIP PINNING,CANNULATED Left 11/09/2015   Procedure: CANNULATED HIP PINNING;  Surgeon: Rod Can, MD;  [facility];  Service: Orthopedics;  Laterality:  Left;  . LIPOMA EXCISION    . TUBAL LIGATION      There were no vitals filed for this visit.  Subjective Assessment - 07/05/19 1118    Subjective  COVID 19 screening performed on patient upon arrival. Reports falling this morning trying to get up spilled coffee.    Pertinent History  MS, tremors, HTN, COPD, bipolar disorder, lumbar DDD, cervical fusion, history of right hip fracture & pinning    Limitations  Standing;Walking;House hold activities    Patient Stated Goals  improve mobility and improve strength in UEs and LEs and function    Currently in Pain?  Yes    Pain Score  7     Pain Location  Back    Pain Orientation  Lower;Mid    Pain Descriptors / Indicators  Discomfort;Sore    Pain Type  Acute pain    Pain Onset  More than a month ago    Pain Frequency  Intermittent    Aggravating Factors   sit > stand         [facility] PT Assessment - 07/05/19 0001      Assessment   Medical Diagnosis  Multiple sclerosis    Referring Provider (PT)  Delphia Grates, MD    Next MD  Visit  Late November 2020    Prior Therapy  yes      Precautions   Precautions  Fall    Precaution Comments  Ataxic gait; close supervision to min/mod A for ambulation and transfers at all times                   [facility] Adult PT Treatment/Exercise - 07/05/19 0001      Knee/Hip Exercises: Seated   Long Arc Quad  AROM;Both;2 sets;5 reps    Other Seated Knee/Hip Exercises  B LE D2 in sitting 1x5 reps    Other Seated Knee/Hip Exercises  B heel/toe raise x10 reps    Marching  AROM;Both;2 sets;5 reps    Hamstring Curl  AROM;Both;2 sets;5 reps    Abduction/Adduction   AROM;Both;2 sets;5 reps    Sit to Sand  5 reps;with UE support   major focus on pushing with UE for strength     Shoulder Exercises: Seated   Row  Strengthening;Both;5 reps;Theraband    Theraband Level (Shoulder Row)  Level 1 (Yellow)    Row Limitations  limited by lumbar/hip pain                  PT Long Term Goals -  05/15/19 1122      PT LONG TERM GOAL #1   Title  Patient will be independent with HEP.    Time  6    Period  Weeks    Status  On-going      PT LONG TERM GOAL #2   Title  Patient will demonstrate modified 5x sit to stand test in 45 seconds or less to decrease fall risk and improve functional LE strength.    Period  Weeks    Status  On-going      PT LONG TERM GOAL #3   Title  Patient will ambulate 150 feet with rollator and close supervision to safely ambulate home.    Time  6    Period  Weeks    Status  On-going      PT LONG TERM GOAL #4   Title  Patient will demonstrate 4/5 or greater bilateral LE MMT to improve stablity during functional tasks.    Time  6    Period  Weeks    Status  On-going      PT LONG TERM GOAL #5   Title  Patient will report ability to perform ADLs with assistance with global pain less than or equal to 5/10.    Time  6    Period  Weeks    Status  On-going            Plan - 07/05/19 1209    Clinical Impression Statement  Patient presented in clinic with report of another fall this morning when she was attempting to kneel and get up spilled coffee. Patient fell backwards onto her hips and back as she was kneeling and hit her head on the refrigerator door. Patient denied any dizziness or headache but only reported soreness. No raised contusion noted on posterior skull. L mid thoracic paraspinals palpably tender and inflammated but no bruising noted on patient's back. Patient able to complete therex although slow pace and moderate spasticity. No assist required from PTA to reduce spasticity. Patient reported weakness in UEs and new UE strengthening attempted but limited with rows secondary to pain.    Personal Factors and Comorbidities  Comorbidity 3+;Time since onset of injury/illness/exacerbation    Examination-Activity Limitations  Bed Mobility;Locomotion Level;Transfers;Hygiene/Grooming    Stability/Clinical Decision Making  Evolving/Moderate complexity     Rehab Potential  Fair    PT Frequency  2x / week    PT Duration  6 weeks    PT Treatment/Interventions  ADLs/Self Care Home Management;Cryotherapy;Electrical Stimulation;Moist Heat;Balance training;Therapeutic exercise;Therapeutic activities;Functional mobility training;Stair training;Gait training;Neuromuscular re-education;Patient/family education;Manual techniques;Passive range of motion    PT Next Visit Plan  Initate LE supine PNF patterns along with supine strengthening/AROM exercises.    PT Home Exercise Plan  see patient education section    Consulted and Agree with Plan of Care  Patient       Patient will benefit from skilled therapeutic intervention in order to improve the following deficits and impairments:  Abnormal gait, Decreased activity tolerance, Decreased balance, Pain, Impaired tone, Difficulty walking, Decreased strength, Decreased mobility, Increased muscle spasms  Visit Diagnosis: Unsteadiness on feet  Muscle weakness (generalized)  Ataxic gait     Problem List Patient Active Problem List   Diagnosis Date Noted  . 'light-for-dates' infant with signs of fetal malnutrition 05/06/2019  . Seizure-like activity ([place]) 12/27/2018  . Dyslipidemia 12/20/2018  . Hyponatremia syndrome 07/04/2018  . DOE (dyspnea on exertion) 07/03/2018  . COPD mixed type ([place]) 07/03/2018  . MDD (major depressive disorder) 06/28/2018  . GAD (generalized anxiety disorder) 06/28/2018  . Major depressive disorder, recurrent episode, moderate ([place]) 04/17/2018  . Head injury 12/08/2017  . Cervical vertebral fusion 07/28/2017  . DDD (degenerative disc disease), cervical 07/10/2017  . DDD (degenerative disc disease), thoracic 07/10/2017  . DDD (degenerative disc disease), lumbar 07/10/2017  . Facet arthritis of cervical region 07/10/2017  . Abnormal liver function tests 03/07/2017  . Hypokalemia 03/07/2017  . Gastroesophageal reflux disease with esophagitis 03/07/2017  . Esophageal  dysphagia 03/07/2017  . Chronic idiopathic constipation 02/01/2017  . Fatigue 02/01/2017  . HSV-1 infection 01/12/2017  . Osteoporosis with pathological fracture, with routine healing, subsequent encounter 08/30/2016  . MDD (major depressive disorder), recurrent episode, severe ([place]) 03/29/2016  . Estrogen deficiency 12/15/2015  . MS (multiple sclerosis) ([place]) 12/15/2015  . Tremor 12/15/2015  . Healthcare maintenance 12/15/2015  . Essential hypertension   . Protein-calorie malnutrition, severe 11/09/2015  . Bipolar 1 disorder, depressed ([place]) 02/11/2015    Standley Brooking, PTA 07/05/2019, 12:31 PM  [facility] [address] [place], [place], [address] Phone: [phone]   Fax:  [phone]  Name: Angel Maddox MRN: [id number] Date of Birth: [birth year]

## 2019-07-10 ENCOUNTER — Other Ambulatory Visit: Payer: Self-pay

## 2019-07-10 ENCOUNTER — Encounter: Payer: Self-pay | Admitting: Physical Therapy

## 2019-07-10 ENCOUNTER — Ambulatory Visit: Payer: Medicare PPO | Admitting: Physical Therapy

## 2019-07-10 DIAGNOSIS — R260 Ataxic gait: Secondary | ICD-10-CM

## 2019-07-10 DIAGNOSIS — R2681 Unsteadiness on feet: Secondary | ICD-10-CM | POA: Diagnosis not present

## 2019-07-10 DIAGNOSIS — M6281 Muscle weakness (generalized): Secondary | ICD-10-CM

## 2019-07-10 NOTE — Therapy (Signed)
[facility] [place], [place], [address] Phone: [phone]   Fax:  [phone]  Physical Therapy Treatment  Patient Details  Name: Angel Maddox MRN: [id number] Date of Birth: Jan 14, 1956 Referring Provider (PT): Delphia Grates, MD   Encounter Date: 07/10/2019  PT End of Session - 07/10/19 1126    Visit Number  14    Number of Visits  16    Date for PT Re-Evaluation  07/26/19    PT Start Time  [id number]    PT Stop Time  1152    PT Time Calculation (min)  26 min    Equipment Utilized During Treatment  Other (comment)   Rollator   Activity Tolerance  Patient limited by fatigue    Behavior During Therapy  [facility] for tasks assessed/performed       Past Medical History:  Diagnosis Date  . Anxiety   . Arthritis    osteo  . Bipolar 1 disorder ([place])   . Cervicalgia   . Chronic kidney disease    kidney stone  . COPD (chronic obstructive pulmonary disease) ([place])    new diagnosis- now sees pulmonologist  . Depression   . Elevated liver enzymes    She says "normal now"  . GERD (gastroesophageal reflux disease)   . H/O hiatal hernia   . Headache   . Hip fracture ([place]) 2017   left  . History of kidney stones    4-5 yrs ago  . Hypertension   . Multiple sclerosis ([place])     Had for 15 years  . Multiple sclerosis ([place])    dx 1999  . Tremors of nervous system     Past Surgical History:  Procedure Laterality Date  . ANTERIOR CERVICAL DECOMP/DISCECTOMY FUSION N/A 07/28/2017   Procedure: CERVICAL FOUR-FIVE, CERVICAL FIVE-SIX ANTERIOR CERVICAL DECOMPRESSION/DISCECTOMY FUSION;  Surgeon: Eustace Moore, MD;  Location: [place];  Service: Neurosurgery;  Laterality: N/A;  . COLONOSCOPY  2008   Dr.Kaplan  . EYE SURGERY     [facility]  . HIP PINNING,CANNULATED Left 11/09/2015   Procedure: CANNULATED HIP PINNING;  Surgeon: Rod Can, MD;  Location: [facility];  Service: Orthopedics;  Laterality: Left;  . LIPOMA  EXCISION    . TUBAL LIGATION      There were no vitals filed for this visit.  Subjective Assessment - 07/10/19 1124    Subjective  COVID 19 screening performed on patient upon arrival. Reports she ran out of new MS medication last week and can really tell a difference since not taking it. Reports feeling really weak since not taking it. New supply is being picked up today. Patient has been under a lot of stress recently.    Pertinent History  MS, tremors, HTN, COPD, bipolar disorder, lumbar DDD, cervical fusion, history of right hip fracture & pinning    Limitations  Standing;Walking;House hold activities    Patient Stated Goals  improve mobility and improve strength in UEs and LEs and function    Currently in Pain?  No/denies         [facility] PT Assessment - 07/10/19 0001      Assessment   Medical Diagnosis  Multiple sclerosis    Referring Provider (PT)  Delphia Grates, MD    Next MD Visit  Late November 2020    Prior Therapy  yes      Precautions   Precautions  Fall    Precaution Comments  Ataxic gait; close supervision to min/mod A  for ambulation and transfers at all times                   [facility] Adult PT Treatment/Exercise - 07/10/19 0001      Knee/Hip Exercises: Seated   Long Arc Quad  AROM;Both;2 sets;10 reps    Clamshell with TheraBand  --   AROM 2x10 reps   Other Seated Knee/Hip Exercises  B LE D2 in sitting 2x5 reps    Marching  AROM;Both;2 sets;10 reps    Hamstring Curl  AROM;Both;2 sets;10 reps    Abduction/Adduction   AROM;Both;2 sets;10 reps    Sit to Sand  5 reps;with UE support   from elevated surface     Shoulder Exercises: Seated   Other Seated Exercises  X to V x10 reps                  PT Long Term Goals - 05/15/19 1122      PT LONG TERM GOAL #1   Title  Patient will be independent with HEP.    Time  6    Period  Weeks    Status  On-going      PT LONG TERM GOAL #2   Title  Patient will demonstrate modified 5x sit to stand  test in 45 seconds or less to decrease fall risk and improve functional LE strength.    Period  Weeks    Status  On-going      PT LONG TERM GOAL #3   Title  Patient will ambulate 150 feet with rollator and close supervision to safely ambulate home.    Time  6    Period  Weeks    Status  On-going      PT LONG TERM GOAL #4   Title  Patient will demonstrate 4/5 or greater bilateral LE MMT to improve stablity during functional tasks.    Time  6    Period  Weeks    Status  On-going      PT LONG TERM GOAL #5   Title  Patient will report ability to perform ADLs with assistance with global pain less than or equal to 5/10.    Time  6    Period  Weeks    Status  On-going            Plan - 07/10/19 1200    Clinical Impression Statement  Patient presented in clinic with reports of greater fatigue as she has went less than a week without the newest MS medication. Patient has felt more fatigued since prescription ran out. Patient noted that spasticity had increased initially after running out but has since decreased. Patient very fatigued with therex exercises and limited greatly by fatigue. Lack of eccentric control noted with sit <> stands from elevated surface. Patient reported using heating pad to her back following last session which has improved post fall pain.    Personal Factors and Comorbidities  Comorbidity 3+;Time since onset of injury/illness/exacerbation    Examination-Activity Limitations  Bed Mobility;Locomotion Level;Transfers;Hygiene/Grooming    Stability/Clinical Decision Making  Evolving/Moderate complexity    Rehab Potential  Fair    PT Frequency  2x / week    PT Duration  6 weeks    PT Treatment/Interventions  ADLs/Self Care Home Management;Cryotherapy;Electrical Stimulation;Moist Heat;Balance training;Therapeutic exercise;Therapeutic activities;Functional mobility training;Stair training;Gait training;Neuromuscular re-education;Patient/family education;Manual  techniques;Passive range of motion    PT Next Visit Plan  Initate LE supine PNF patterns along with supine strengthening/AROM exercises.  PT Home Exercise Plan  see patient education section    Consulted and Agree with Plan of Care  Patient       Patient will benefit from skilled therapeutic intervention in order to improve the following deficits and impairments:  Abnormal gait, Decreased activity tolerance, Decreased balance, Pain, Impaired tone, Difficulty walking, Decreased strength, Decreased mobility, Increased muscle spasms  Visit Diagnosis: Unsteadiness on feet  Muscle weakness (generalized)  Ataxic gait     Problem List Patient Active Problem List   Diagnosis Date Noted  . 'light-for-dates' infant with signs of fetal malnutrition 05/06/2019  . Seizure-like activity ([place]) 12/27/2018  . Dyslipidemia 12/20/2018  . Hyponatremia syndrome 07/04/2018  . DOE (dyspnea on exertion) 07/03/2018  . COPD mixed type ([place]) 07/03/2018  . MDD (major depressive disorder) 06/28/2018  . GAD (generalized anxiety disorder) 06/28/2018  . Major depressive disorder, recurrent episode, moderate ([place]) 04/17/2018  . Head injury 12/08/2017  . Cervical vertebral fusion 07/28/2017  . DDD (degenerative disc disease), cervical 07/10/2017  . DDD (degenerative disc disease), thoracic 07/10/2017  . DDD (degenerative disc disease), lumbar 07/10/2017  . Facet arthritis of cervical region 07/10/2017  . Abnormal liver function tests 03/07/2017  . Hypokalemia 03/07/2017  . Gastroesophageal reflux disease with esophagitis 03/07/2017  . Esophageal dysphagia 03/07/2017  . Chronic idiopathic constipation 02/01/2017  . Fatigue 02/01/2017  . HSV-1 infection 01/12/2017  . Osteoporosis with pathological fracture, with routine healing, subsequent encounter 08/30/2016  . MDD (major depressive disorder), recurrent episode, severe ([place]) 03/29/2016  . Estrogen deficiency 12/15/2015  . MS (multiple sclerosis) ([place])  12/15/2015  . Tremor 12/15/2015  . Healthcare maintenance 12/15/2015  . Essential hypertension   . Protein-calorie malnutrition, severe 11/09/2015  . Bipolar 1 disorder, depressed ([place]) 02/11/2015    Standley Brooking, PTA 07/10/2019, 12:06 PM  [facility] [address] [place], [place], [address] Phone: [phone]   Fax:  [phone]  Name: BRIDGETT LECY MRN: [id number] Date of Birth: 1955/07/09

## 2019-07-11 ENCOUNTER — Ambulatory Visit (INDEPENDENT_AMBULATORY_CARE_PROVIDER_SITE_OTHER): Payer: Medicare PPO | Admitting: Psychiatry

## 2019-07-11 DIAGNOSIS — F411 Generalized anxiety disorder: Secondary | ICD-10-CM

## 2019-07-11 NOTE — Progress Notes (Signed)
[facility] Counselor/Therapist Progress Note  Patient ID: Angel Maddox, MRN: [id number],    Date: 07/11/2019  Time Spent: 60 minutes   12:00noon to 1:00pm  Treatment Type: Individual Therapy  Reported Symptoms:  Anxiety, frustration, fearful thinking re: covid and "the anger and violence in the [place] ; hurt because extended family members do not call and check on her often.  Mental Status Exam:  Appearance:   Neat     Behavior:  Appropriate and Sharing  Motor:  Normal "for patient" due to her MS  Speech/Language:   Normal Rate  Affect:  anxious, depressed  Mood:  anxious and depressed  Thought process:  goal directed  Thought content:    WNL  Sensory/Perceptual disturbances:    WNL  Orientation:  oriented to person, place, time/date, situation, day of week, month of year and year  Attention:  Fair  Concentration:  Fair  Memory:  patient reports "poor" and she states her doctors are aware of this  Fund of knowledge:   Good  Insight:    Good  Judgment:   Good  Impulse Control:  Impulsive thinking sometimes especially when stressed or angry   Risk Assessment: Danger to Self:  No Self-injurious Behavior: No Danger to Others: No Duty to Warn:no Physical Aggression / Violence:No  Access to Firearms a concern: No  Gang Involvement:No   Subjective: Patient in today reporting anxiety, fearful thinking, depression, frustration, and feeling hurt by family due to lack of contact.   Interventions: Cognitive Behavioral Therapy and Solution-Oriented/Positive Psychology  Diagnosis:   ICD-10-CM   1. Generalized anxiety disorder  F41.1      Plan: Patient not signing tx plan updates on computer screen due to [place].   Treatment Goals: Goals may remain on tx plan as patient works on strategies to meet her goals. Progress will be documented each session in "Progress" section.  Long term goal: Develop the ability to recognize, accept, and cope with feelings of  depression and anxiety.  Short term goal: Learn and implement calming skills to reduce overall tension and moments of increased anxiety and/or depression.Patient will recognize her negative self-talk, interrupt it, and not let it prevent her from trying new strategies to help herself.  Strategy: Teach patient cognitive and somatic calming skills . Rehearse with patient how to apply these skills to her daily life. Review and reinforce success while providing corrective feedback toward consistent implementation.  PROGRESS: Followed up with patient on symptoms noted above.  Focused first on her anxiety and depression. Looked at her anxious/negative/thoughts, citing several examples. Using the examples given, we reviewed strategy for identifying, intercepting, and replacing anxious/negative/depressive thoughts to more positive, reality-based, and empowering thoughts that do not support anxiety nor depression. Also shared more hurt she has endured from family members and still feels they do not show much concern for her. Vented anger about this and afterwards tried to discuss some problem-solving and communication skills for working through anger and re-engaging family members.  She was calmer towards session end although still had feelings about "who's fault it was within the family".  Suggested some relaxation skills that we've talked about previously  And also suggested that some of the more difficult family conversations might be more effective if done in person rather than text, because indirect communication can easily lead to misinterpretation and misunderstanding. Goal review and some progress noted with patient.  Next appt within 2-3 weeks.   [name], LCSW

## 2019-07-12 ENCOUNTER — Encounter: Payer: Medicare PPO | Admitting: Physical Therapy

## 2019-07-17 ENCOUNTER — Ambulatory Visit: Payer: Medicare Other | Admitting: Physician Assistant

## 2019-07-17 ENCOUNTER — Ambulatory Visit: Payer: Medicare PPO | Admitting: Physical Therapy

## 2019-07-17 ENCOUNTER — Encounter: Payer: Self-pay | Admitting: Physical Therapy

## 2019-07-17 ENCOUNTER — Other Ambulatory Visit: Payer: Self-pay

## 2019-07-17 DIAGNOSIS — R260 Ataxic gait: Secondary | ICD-10-CM

## 2019-07-17 DIAGNOSIS — R2681 Unsteadiness on feet: Secondary | ICD-10-CM

## 2019-07-17 DIAGNOSIS — M6281 Muscle weakness (generalized): Secondary | ICD-10-CM

## 2019-07-17 NOTE — Therapy (Signed)
[facility] [place], [place], [address] Phone: [phone]   Fax:  [phone]  Physical Therapy Treatment  Patient Details  Name: Angel Maddox MRN: [id number] Date of Birth: 1955-12-05 Referring Provider (PT): Delphia Grates, MD   Encounter Date: 07/17/2019  PT End of Session - 07/17/19 1305    Visit Number  15    Number of Visits  16    Date for PT Re-Evaluation  07/26/19    PT Start Time  1302    PT Stop Time  1328    PT Time Calculation (min)  26 min    Equipment Utilized During Treatment  Other (comment)   Rollator   Activity Tolerance  Patient limited by fatigue    Behavior During Therapy  [facility] for tasks assessed/performed       Past Medical History:  Diagnosis Date  . Anxiety   . Arthritis    osteo  . Bipolar 1 disorder ([place])   . Cervicalgia   . Chronic kidney disease    kidney stone  . COPD (chronic obstructive pulmonary disease) ([place])    new diagnosis- now sees pulmonologist  . Depression   . Elevated liver enzymes    She says "normal now"  . GERD (gastroesophageal reflux disease)   . H/O hiatal hernia   . Headache   . Hip fracture ([place]) 2017   left  . History of kidney stones    4-5 yrs ago  . Hypertension   . Multiple sclerosis ([place])     Had for 15 years  . Multiple sclerosis ([place])    dx 1999  . Tremors of nervous system     Past Surgical History:  Procedure Laterality Date  . ANTERIOR CERVICAL DECOMP/DISCECTOMY FUSION N/A 07/28/2017   Procedure: CERVICAL FOUR-FIVE, CERVICAL FIVE-SIX ANTERIOR CERVICAL DECOMPRESSION/DISCECTOMY FUSION;  Surgeon: Eustace Moore, MD;  Location: [facility];  Service: Neurosurgery;  Laterality: N/A;  . COLONOSCOPY  2008   Dr.Kaplan  . EYE SURGERY     [facility]  . HIP PINNING,CANNULATED Left 11/09/2015   Procedure: CANNULATED HIP PINNING;  Surgeon: Rod Can, MD;  Location[facility];  Service: Orthopedics;  Laterality: Left;  . LIPOMA  EXCISION    . TUBAL LIGATION      There were no vitals filed for this visit.  Subjective Assessment - 07/17/19 1303    Subjective  COVID 19 screening performed on patient upon arrival. Patient reports she was interested in trying Nustep again. Reports doing her exercises at home as well and felt refreshed after getting out of the house for the weekend.    Pertinent History  MS, tremors, HTN, COPD, bipolar disorder, lumbar DDD, cervical fusion, history of right hip fracture & pinning    Limitations  Standing;Walking;House hold activities    Patient Stated Goals  improve mobility and improve strength in UEs and LEs and function    Currently in Pain?  No/denies         [facility] PT Assessment - 07/17/19 0001      Assessment   Medical Diagnosis  Multiple sclerosis    Referring Provider (PT)  Delphia Grates, MD    Next MD Visit  Late November 2020    Prior Therapy  yes      Precautions   Precautions  Fall    Precaution Comments  Ataxic gait; close supervision to min/mod A for ambulation and transfers at all times  [facility] Adult PT Treatment/Exercise - 07/17/19 0001      Knee/Hip Exercises: Aerobic   Nustep  L1 x10 min      Knee/Hip Exercises: Seated   Long Arc Quad  AROM;Both;2 sets;10 reps    Clamshell with TheraBand  Yellow   2x10 reps   Other Seated Knee/Hip Exercises  B LE D2 in sitting 2x5 reps    Other Seated Knee/Hip Exercises  B heel and toe raises 2x10 reps    Marching  AROM;Both;2 sets;10 reps    Marching Limitations  alternating    Hamstring Curl  AROM;Both;2 sets;10 reps    Sit to Sand  with UE support   limited to 2 reps per LE weakness and fatigue                 PT Long Term Goals - 05/15/19 1122      PT LONG TERM GOAL #1   Title  Patient will be independent with HEP.    Time  6    Period  Weeks    Status  On-going      PT LONG TERM GOAL #2   Title  Patient will demonstrate modified 5x sit to stand test in 45  seconds or less to decrease fall risk and improve functional LE strength.    Period  Weeks    Status  On-going      PT LONG TERM GOAL #3   Title  Patient will ambulate 150 feet with rollator and close supervision to safely ambulate home.    Time  6    Period  Weeks    Status  On-going      PT LONG TERM GOAL #4   Title  Patient will demonstrate 4/5 or greater bilateral LE MMT to improve stablity during functional tasks.    Time  6    Period  Weeks    Status  On-going      PT LONG TERM GOAL #5   Title  Patient will report ability to perform ADLs with assistance with global pain less than or equal to 5/10.    Time  6    Period  Weeks    Status  On-going            Plan - 07/17/19 1342    Clinical Impression Statement  Patient presented in clinic with improved outlook and disposition after getting new MS medication refill and a weekend getaway. Patient interested in completing Nustep workout today and lasted 10 minutes. Upon standing after Nustep patient reported LE fatigue. Patient then progressed to other LE strengthening exercises but eventually limited by LE fatigue. Patient able to ambulate into clinic with improved gait and increased but safe speed. Following end of treatment patient's gait was much slower and more purposeful due to fatigue.    Personal Factors and Comorbidities  Comorbidity 3+;Time since onset of injury/illness/exacerbation    Examination-Activity Limitations  Bed Mobility;Locomotion Level;Transfers;Hygiene/Grooming    Stability/Clinical Decision Making  Evolving/Moderate complexity    Rehab Potential  Fair    PT Frequency  2x / week    PT Duration  6 weeks    PT Treatment/Interventions  ADLs/Self Care Home Management;Cryotherapy;Electrical Stimulation;Moist Heat;Balance training;Therapeutic exercise;Therapeutic activities;Functional mobility training;Stair training;Gait training;Neuromuscular re-education;Patient/family education;Manual techniques;Passive  range of motion    PT Next Visit Plan  Initate LE supine PNF patterns along with supine strengthening/AROM exercises.    PT Home Exercise Plan  see patient education section    Consulted and Agree  with Plan of Care  Patient       Patient will benefit from skilled therapeutic intervention in order to improve the following deficits and impairments:  Abnormal gait, Decreased activity tolerance, Decreased balance, Pain, Impaired tone, Difficulty walking, Decreased strength, Decreased mobility, Increased muscle spasms  Visit Diagnosis: Unsteadiness on feet  Muscle weakness (generalized)  Ataxic gait     Problem List Patient Active Problem List   Diagnosis Date Noted  . 'light-for-dates' infant with signs of fetal malnutrition 05/06/2019  . Seizure-like activity ([place]) 12/27/2018  . Dyslipidemia 12/20/2018  . Hyponatremia syndrome 07/04/2018  . DOE (dyspnea on exertion) 07/03/2018  . COPD mixed type ([place]) 07/03/2018  . MDD (major depressive disorder) 06/28/2018  . GAD (generalized anxiety disorder) 06/28/2018  . Major depressive disorder, recurrent episode, moderate ([place]) 04/17/2018  . Head injury 12/08/2017  . Cervical vertebral fusion 07/28/2017  . DDD (degenerative disc disease), cervical 07/10/2017  . DDD (degenerative disc disease), thoracic 07/10/2017  . DDD (degenerative disc disease), lumbar 07/10/2017  . Facet arthritis of cervical region 07/10/2017  . Abnormal liver function tests 03/07/2017  . Hypokalemia 03/07/2017  . Gastroesophageal reflux disease with esophagitis 03/07/2017  . Esophageal dysphagia 03/07/2017  . Chronic idiopathic constipation 02/01/2017  . Fatigue 02/01/2017  . HSV-1 infection 01/12/2017  . Osteoporosis with pathological fracture, with routine healing, subsequent encounter 08/30/2016  . MDD (major depressive disorder), recurrent episode, severe ([place]) 03/29/2016  . Estrogen deficiency 12/15/2015  . MS (multiple sclerosis) ([place]) 12/15/2015  .  Tremor 12/15/2015  . Healthcare maintenance 12/15/2015  . Essential hypertension   . Protein-calorie malnutrition, severe 11/09/2015  . Bipolar 1 disorder, depressed ([place]) 02/11/2015    Standley Brooking, PTA 07/17/2019, 2:00 PM  [facility] [address] [place], [place], [address] Phone: [phone]   Fax:  [phone]  Name: Angel Maddox MRN: [id number] Date of Birth: Jul 26, 1955

## 2019-07-19 ENCOUNTER — Other Ambulatory Visit: Payer: Self-pay

## 2019-07-19 ENCOUNTER — Encounter: Payer: Self-pay | Admitting: Physical Therapy

## 2019-07-19 ENCOUNTER — Ambulatory Visit: Payer: Medicare PPO | Admitting: Physical Therapy

## 2019-07-19 DIAGNOSIS — R260 Ataxic gait: Secondary | ICD-10-CM

## 2019-07-19 DIAGNOSIS — R2681 Unsteadiness on feet: Secondary | ICD-10-CM | POA: Diagnosis not present

## 2019-07-19 DIAGNOSIS — M6281 Muscle weakness (generalized): Secondary | ICD-10-CM

## 2019-07-19 NOTE — Therapy (Addendum)
[facility], [place], [address] Phone: [phone]   Fax:  [phone]  Physical Therapy Treatment  Patient Details  Name: Angel Maddox MRN: [id number] Date of Birth: 09-17-1955 Referring Provider (PT): Delphia Grates, MD   Encounter Date: 07/19/2019  PT End of Session - 07/19/19 1118    Visit Number  16    Number of Visits  24    Date for PT Re-Evaluation  09/13/19    PT Start Time  1118    PT Stop Time  1152    PT Time Calculation (min)  34 min    Equipment Utilized During Treatment  Other (comment)   Rollator   Activity Tolerance  Patient limited by fatigue    Behavior During Therapy  [facility] for tasks assessed/performed       Past Medical History:  Diagnosis Date  . Anxiety   . Arthritis    osteo  . Bipolar 1 disorder ([place])   . Cervicalgia   . Chronic kidney disease    kidney stone  . COPD (chronic obstructive pulmonary disease) ([place])    new diagnosis- now sees pulmonologist  . Depression   . Elevated liver enzymes    She says "normal now"  . GERD (gastroesophageal reflux disease)   . H/O hiatal hernia   . Headache   . Hip fracture ([place]) 2017   left  . History of kidney stones    4-5 yrs ago  . Hypertension   . Multiple sclerosis ([place])     Had for 15 years  . Multiple sclerosis ([place])    dx 1999  . Tremors of nervous system     Past Surgical History:  Procedure Laterality Date  . ANTERIOR CERVICAL DECOMP/DISCECTOMY FUSION N/A 07/28/2017   Procedure: CERVICAL FOUR-FIVE, CERVICAL FIVE-SIX ANTERIOR CERVICAL DECOMPRESSION/DISCECTOMY FUSION;  Surgeon: Eustace Moore, MD;  Location: [facility];  Service: Neurosurgery;  Laterality: N/A;  . COLONOSCOPY  2008   Dr.Kaplan  . EYE SURGERY     [facility]  . HIP PINNING,CANNULATED Left 11/09/2015   Procedure: CANNULATED HIP PINNING;  Surgeon: Rod Can, MD;  Location: [facility];  Service: Orthopedics;  Laterality: Left;  . LIPOMA  EXCISION    . TUBAL LIGATION      There were no vitals filed for this visit.  Subjective Assessment - 07/19/19 1116    Subjective  COVID 19 screening performed on patient upon arrival. Requested no Nustep due to fatigue upon arrival and RLE dragging more today.    Pertinent History  MS, tremors, HTN, COPD, bipolar disorder, lumbar DDD, cervical fusion, history of right hip fracture & pinning    Limitations  Standing;Walking;House hold activities    Patient Stated Goals  improve mobility and improve strength in UEs and LEs and function    Currently in Pain?  No/denies         [facility] PT Assessment - 07/19/19 0001      Assessment   Medical Diagnosis  Multiple sclerosis    Referring Provider (PT)  Delphia Grates, MD    Next MD Visit  Late November 2020    Prior Therapy  yes      Precautions   Precautions  Fall    Precaution Comments  Ataxic gait; close supervision to min/mod A for ambulation and transfers at all times                   [facility] Adult PT Treatment/Exercise - 07/19/19  0001      Knee/Hip Exercises: Seated   Long Arc Quad  AROM;Both;2 sets;10 reps    Clamshell with TheraBand  --   2x10 reps   Other Seated Knee/Hip Exercises  B LE D2 in sitting 2x5 reps    Marching  AROM;Both;2 sets;10 reps    Hamstring Curl  AROM;Both;2 sets;10 reps    Sit to Sand  5 reps;with UE support             PT Education - 07/19/19 1229    Education Details  HEP- drawin, seated clam, pillow squeeze, marching, LAQ with core    Person(s) Educated  Patient    Methods  Explanation;Handout    Comprehension  Verbalized understanding          PT Long Term Goals - 07/19/19 1129      PT LONG TERM GOAL #1   Title  Patient will be independent with HEP.    Time  6    Period  Weeks    Status  Achieved      PT LONG TERM GOAL #2   Title  Patient will demonstrate modified 5x sit to stand test in 45 seconds or less to decrease fall risk and improve functional LE strength.     Period  Weeks    Status  On-going      PT LONG TERM GOAL #3   Title  Patient will ambulate 150 feet with rollator and close supervision to safely ambulate home.    Time  6    Period  Weeks    Status  On-going      PT LONG TERM GOAL #4   Title  Patient will demonstrate 4/5 or greater bilateral LE MMT to improve stablity during functional tasks.    Time  6    Period  Weeks    Status  On-going      PT LONG TERM GOAL #5   Title  Patient will report ability to perform ADLs with assistance with global pain less than or equal to 5/10.    Time  6    Period  Weeks    Status  Achieved            Plan - 07/19/19 1214    Clinical Impression Statement  Patient presented in clinic with complaints of LE fatigue. Patient compliant with HEP at home. Patient requested to skip Nustep due to fatigue. Technique of sit > stands deteriorated as reps increased. More core activation instructed with seated LE exercises. Patient provided new HEP printout to include LAQ and written cues to include core activation. Minimal spascticity present with ambulating within clinic after end of therex.    Personal Factors and Comorbidities  Comorbidity 3+;Time since onset of injury/illness/exacerbation    Examination-Activity Limitations  Bed Mobility;Locomotion Level;Transfers;Hygiene/Grooming    Stability/Clinical Decision Making  Evolving/Moderate complexity    Rehab Potential  Fair    PT Frequency  2x / week    PT Duration  6 weeks    PT Treatment/Interventions  ADLs/Self Care Home Management;Cryotherapy;Electrical Stimulation;Moist Heat;Balance training;Therapeutic exercise;Therapeutic activities;Functional mobility training;Stair training;Gait training;Neuromuscular re-education;Patient/family education;Manual techniques;Passive range of motion    PT Next Visit Plan  Initate LE supine PNF patterns along with supine strengthening/AROM exercises.    PT Home Exercise Plan  see patient education section     Consulted and Agree with Plan of Care  Patient       Patient will benefit from skilled therapeutic intervention in order to  improve the following deficits and impairments:  Abnormal gait, Decreased activity tolerance, Decreased balance, Pain, Impaired tone, Difficulty walking, Decreased strength, Decreased mobility, Increased muscle spasms  Visit Diagnosis: Unsteadiness on feet  Muscle weakness (generalized)  Ataxic gait     Problem List Patient Active Problem List   Diagnosis Date Noted  . 'light-for-dates' infant with signs of fetal malnutrition 05/06/2019  . Seizure-like activity ([place]) 12/27/2018  . Dyslipidemia 12/20/2018  . Hyponatremia syndrome 07/04/2018  . DOE (dyspnea on exertion) 07/03/2018  . COPD mixed type ([place]) 07/03/2018  . MDD (major depressive disorder) 06/28/2018  . GAD (generalized anxiety disorder) 06/28/2018  . Major depressive disorder, recurrent episode, moderate ([place]) 04/17/2018  . Head injury 12/08/2017  . Cervical vertebral fusion 07/28/2017  . DDD (degenerative disc disease), cervical 07/10/2017  . DDD (degenerative disc disease), thoracic 07/10/2017  . DDD (degenerative disc disease), lumbar 07/10/2017  . Facet arthritis of cervical region 07/10/2017  . Abnormal liver function tests 03/07/2017  . Hypokalemia 03/07/2017  . Gastroesophageal reflux disease with esophagitis 03/07/2017  . Esophageal dysphagia 03/07/2017  . Chronic idiopathic constipation 02/01/2017  . Fatigue 02/01/2017  . HSV-1 infection 01/12/2017  . Osteoporosis with pathological fracture, with routine healing, subsequent encounter 08/30/2016  . MDD (major depressive disorder), recurrent episode, severe ([place]) 03/29/2016  . Estrogen deficiency 12/15/2015  . MS (multiple sclerosis) ([place]) 12/15/2015  . Tremor 12/15/2015  . Healthcare maintenance 12/15/2015  . Essential hypertension   . Protein-calorie malnutrition, severe 11/09/2015  . Bipolar 1 disorder, depressed ([place])  02/11/2015    Standley Brooking, PTA 07/19/19 12:54 PM   [facility] [address] [place], [place], [address] Phone: [phone]   Fax:  [phone]  Name: KYNDAL CORRA MRN: [id number] Date of Birth: [birth year]

## 2019-07-19 NOTE — Addendum Note (Signed)
Addended by: [name] on: 07/19/2019 12:55 PM   Modules accepted: Orders

## 2019-07-22 ENCOUNTER — Other Ambulatory Visit: Payer: Self-pay

## 2019-07-22 ENCOUNTER — Ambulatory Visit (INDEPENDENT_AMBULATORY_CARE_PROVIDER_SITE_OTHER): Payer: Medicare PPO | Admitting: Psychiatry

## 2019-07-22 ENCOUNTER — Ambulatory Visit (INDEPENDENT_AMBULATORY_CARE_PROVIDER_SITE_OTHER): Payer: Medicare PPO | Admitting: Internal Medicine

## 2019-07-22 ENCOUNTER — Telehealth: Payer: Self-pay | Admitting: Physician Assistant

## 2019-07-22 ENCOUNTER — Encounter: Payer: Self-pay | Admitting: Internal Medicine

## 2019-07-22 VITALS — BP 132/87 | HR 81 | Temp 98.4°F | Wt 143.2 lb

## 2019-07-22 DIAGNOSIS — F411 Generalized anxiety disorder: Secondary | ICD-10-CM

## 2019-07-22 DIAGNOSIS — G35 Multiple sclerosis: Secondary | ICD-10-CM

## 2019-07-22 DIAGNOSIS — Z79899 Other long term (current) drug therapy: Secondary | ICD-10-CM

## 2019-07-22 DIAGNOSIS — E785 Hyperlipidemia, unspecified: Secondary | ICD-10-CM | POA: Diagnosis not present

## 2019-07-22 DIAGNOSIS — F329 Major depressive disorder, single episode, unspecified: Secondary | ICD-10-CM

## 2019-07-22 DIAGNOSIS — I10 Essential (primary) hypertension: Secondary | ICD-10-CM | POA: Diagnosis not present

## 2019-07-22 MED ORDER — AMLODIPINE BESYLATE 10 MG PO TABS: 10.0000 mg | ORAL_TABLET | Freq: Every day | ORAL | 1 refills | Status: DC

## 2019-07-22 MED ORDER — BUPROPION HCL ER (SR) 200 MG PO TB12: 200.0000 mg | ORAL_TABLET | Freq: Two times a day (BID) | ORAL | 5 refills | Status: DC

## 2019-07-22 MED ORDER — LOSARTAN POTASSIUM 25 MG PO TABS: 25.0000 mg | ORAL_TABLET | Freq: Every day | ORAL | 0 refills | Status: DC

## 2019-07-22 NOTE — Patient Instructions (Signed)
Angel Maddox, It was nice seeing you. Your blood pressure is looking much better so we will not make any medication changes.  I am checking some lab work today and will let you know if you need to be back on the cholesterol medication.   Sorry to hear about your family stress, but you are doing the right things which are going to counseling and doing your best to remove yourself from the situation.   Talk to your MS physician about the COVID vaccine and seeing if there is anything to be done about the side effects you may be having from the medications.   Take care! Dr. Koleen Distance

## 2019-07-22 NOTE — Telephone Encounter (Signed)
Pt called to request refill for Wellbutrin @ [facility] on file. Appt 1/28

## 2019-07-22 NOTE — Assessment & Plan Note (Signed)
Patient was found to have hyperlipidemia while hospitalized in 11/2018. She was initiated on a statin, but admits to not taking it as often as prescribed. Will re-check lipid panel today and encourage medication compliance if remains elevated.

## 2019-07-22 NOTE — Assessment & Plan Note (Signed)
Patient was transitioned from Lisinopril to Losartan at visit 3 weeks ago due to side effect of cough. This has improved since stopping Lisinopril. Blood pressure is much improved and within goal today. She is tolerating Amlodipine and Losartan well. Will check BMP to ensure electrolytes and renal function are stable.  Follow-up in 4 months.

## 2019-07-22 NOTE — Progress Notes (Signed)
   CC: HTN, HLD  HPI:  [name] is a 64 y.o. female with PMHx listed below who presents for follow-up on HTN and HLD. Please see the A&P for status of patient's chronic medical problems.   Past Medical History:  Diagnosis Date  . Anxiety   . Arthritis    osteo  . Bipolar 1 disorder ([place])   . Cervicalgia   . Chronic kidney disease    kidney stone  . COPD (chronic obstructive pulmonary disease) ([place])    new diagnosis- now sees pulmonologist  . Depression   . Elevated liver enzymes    She says "normal now"  . GERD (gastroesophageal reflux disease)   . H/O hiatal hernia   . Headache   . Hip fracture ([place]) 2017   left  . History of kidney stones    4-5 yrs ago  . Hypertension   . Multiple sclerosis ([place])     Had for 15 years  . Multiple sclerosis ([place])    dx 1999  . Tremors of nervous system    Review of Systems: Review of Systems  Constitutional: Negative for chills and fever.  Eyes: Negative for blurred vision.  Respiratory: Negative for cough and shortness of breath.   Cardiovascular: Negative for chest pain and palpitations.  Gastrointestinal: Negative for abdominal pain, nausea and vomiting.  Genitourinary: Negative for dysuria.  Musculoskeletal: Negative for falls.  Neurological: Negative for dizziness, sensory change, seizures and loss of consciousness.  Psychiatric/Behavioral: Positive for depression. The patient is nervous/anxious.      Physical Exam:  Vitals:   07/22/19 1413  BP: 132/87  Pulse: 81  Temp: 98.4 F (36.9 C)  TempSrc: Oral  SpO2: 99%   Physical Exam Constitutional:      General: She is not in acute distress.    Appearance: Normal appearance.  Eyes:     Conjunctiva/sclera: Conjunctivae normal.  Cardiovascular:     Rate and Rhythm: Normal rate and regular rhythm.  Pulmonary:     Effort: Pulmonary effort is normal.     Breath sounds: Normal breath sounds.  Abdominal:     General: There is no distension.     Palpations:  Abdomen is soft.     Tenderness: There is no abdominal tenderness.  Musculoskeletal:     Cervical back: Neck supple.     Right lower leg: No edema.     Left lower leg: No edema.  Lymphadenopathy:     Cervical: No cervical adenopathy.  Skin:    General: Skin is warm and dry.  Neurological:     Mental Status: She is alert and oriented to person, place, and time.     Sensory: Sensation is intact.     Comments: 4/5 strength in RLE, 4+/5 LLE, 5/5 in BUE        Assessment & Plan:   See Encounters Tab for problem based charting.  Patient discussed with Dr. Rebeca Alert

## 2019-07-22 NOTE — Telephone Encounter (Signed)
Refill for Wellbutrin SR 200 mg bid submitted #[phone]

## 2019-07-22 NOTE — Progress Notes (Signed)
[facility] Counselor/Therapist Progress Note  Patient ID: Angel Maddox, MRN: [id number],    Date: 07/22/2019  Time Spent: 60 minutes  11:00am to 12:00noon  Treatment Type: Individual Therapy  Reported Symptoms: anxiety, depression, frustration, some anger (towards a couple family members that she reports will not work to improve their relationship); reports no new meds nor med issues.  Mental Status Exam:  Appearance:   Casual     Behavior:  Appropriate and Sharing  Motor:  Normal  Speech/Language:   Normal Rate  Affect:  anxious, depressed, frustration, some irritability  Mood:  anxious, depressed and irritable  Thought process:  goal directed  Thought content:    WNL  Sensory/Perceptual disturbances:    WNL  Orientation:  oriented to person, place, time/date, situation, day of week, month of year and year  Attention:  Good  Concentration:  Good   Memory:  some forgetfulness reported  Fund of knowledge:   Good  Insight:    Good  Judgment:   Good / Fair sometimes  Impulse Control:  Good/ Fair sometimes   Risk Assessment: Danger to Self:  No Self-injurious Behavior: No Danger to Others: No Duty to Warn:no Physical Aggression / Violence:No  Access to Firearms a concern: No  Gang Involvement:No   Subjective: Patient reports having anxiety and depression, some issues with husband re: extended family. Husband took her out overnight for dinner and to "just have a get-away" and it went well.  Patient still feels there are a lot of ups and downs with others in family (particularly a daughter-in-law) and that they are not committed to any healing between them.  Interventions: Cognitive Behavioral Therapy and Solution-Oriented/Positive Psychology  Diagnosis:   ICD-10-CM   1. Generalized anxiety disorder  F41.1      Plan: Patient not signing tx plan updates on computer screen due to [place].   Treatment Goals: Goals may remain on tx plan as patient works on  strategies to meet her goals. Progress will be documented each session in "Progress" section.  Long term goal: Develop the ability to recognize, accept, and cope with feelings of depression and anxiety.  Short term goal: Learn and implement calming skills to reduce overall tension and moments of increased anxiety and/or depression.Patient will recognize her negative self-talk, interrupt it, and not let it prevent her from trying new strategies to help herself.  Strategy: Teach patient cognitive and somatic calming skills . Rehearse with patient how to apply these skills to her daily life. Review and reinforce success while providing corrective feedback toward consistent implementation.  PROGRESS: Patient today reporting anxiety, depression, frustration, and some anger/irritability.  Started out talking about how she felt some better and outlook was a little improved. When she started talking about her daughter and older daughter-in-law, her mood and talking became very negative. She vented for a while and I gave her feedback on what I saw happening with her shift in mood, how she went from feeling more upbeat to suddenly negative and angry when mentioning daughter-in-law.  This has been an ongoing issue for patient and she has tried to work more on realizing she can't change other people. Looked at what patient can do to not focus as much on what she can change, and focus more on what she can change, and working on letting go of what she can't change.  Talked about staying in the present, versus the past, nor jumping ahead into the future.  She agreed to work  harder to remain in the present and trying to catch herself when she steps out of the present.  Self-talk was reviewed in session and patient reminded of how the negativity feeds her anxiety and depression, which she did seem to understand some. To follow up on this next visit and she is to practice some "redirection of focus" discussed with  her near session end.  Goal review and progress/challenges noted with patient.  Next appt within 2-3 weeks.   [name], LCSW

## 2019-07-22 NOTE — Assessment & Plan Note (Signed)
Patient is quite tearful on exam today discussing various stressors in her life right now, primarily family. Fortunately she is followed closely by psychology and psychiatry. Provided emotional support and encouraged her to continue seeing her counselor as often as possible. Emphasized importance of minimizing stress with MS, as it can worsen symptoms.

## 2019-07-23 LAB — LIPID PANEL
Chol/HDL Ratio: 1.9 ratio (ref 0.0–4.4)
Cholesterol, Total: 210 mg/dL — ABNORMAL HIGH (ref 100–199)
HDL: 111 mg/dL (ref >39)
LDL Chol Calc (NIH): 85 mg/dL (ref 0–99)
Triglycerides: 82 mg/dL (ref 0–149)
VLDL Cholesterol Cal: 14 mg/dL (ref 5–40)

## 2019-07-23 LAB — BMP8+ANION GAP
Anion Gap: 15 mmol/L (ref 10.0–18.0)
BUN/Creatinine Ratio: 9 — ABNORMAL LOW (ref 12–28)
BUN: 9 mg/dL (ref 8–27)
CO2: 25 mmol/L (ref 20–29)
Calcium: 8.8 mg/dL (ref 8.7–10.3)
Chloride: 98 mmol/L (ref 96–106)
Creatinine, Ser: 1 mg/dL (ref 0.57–1.00)
GFR calc Af Amer: 69 mL/min/{1.73_m2} (ref >59)
GFR calc non Af Amer: 60 mL/min/{1.73_m2} (ref >59)
Glucose: 69 mg/dL (ref 65–99)
Potassium: 4.5 mmol/L (ref 3.5–5.2)
Sodium: 138 mmol/L (ref 134–144)

## 2019-07-23 NOTE — Progress Notes (Signed)
Internal Medicine Clinic Attending  Case discussed with Dr. Bloomfield.  We reviewed the resident's history and exam and pertinent patient test results.  I agree with the assessment, diagnosis, and plan of care documented in the resident's note.  Aser Nylund, M.D., Ph.D.  

## 2019-07-24 ENCOUNTER — Ambulatory Visit: Payer: Medicare PPO | Admitting: Physical Therapy

## 2019-07-25 ENCOUNTER — Encounter: Payer: Self-pay | Admitting: Physician Assistant

## 2019-07-25 ENCOUNTER — Ambulatory Visit (INDEPENDENT_AMBULATORY_CARE_PROVIDER_SITE_OTHER): Payer: Medicare PPO | Admitting: Physician Assistant

## 2019-07-25 DIAGNOSIS — G4700 Insomnia, unspecified: Secondary | ICD-10-CM

## 2019-07-25 DIAGNOSIS — F331 Major depressive disorder, recurrent, moderate: Secondary | ICD-10-CM | POA: Diagnosis not present

## 2019-07-25 DIAGNOSIS — F411 Generalized anxiety disorder: Secondary | ICD-10-CM

## 2019-07-25 NOTE — Progress Notes (Signed)
[facility]  Patient ID: MIKE ROYALTY,  MRN: [id number]  PCP: Delice Bison, DO  Date of Evaluation: 07/25/2019 Time spent:20 minutes  Chief Complaint:  Chief Complaint    Anxiety; Depression; Insomnia     Virtual Visit via Telephone Note  I connected with patient by a video enabled telemedicine application or telephone, with their informed consent, and verified patient privacy and that I am speaking with the correct person using two identifiers.  I am private, in my office and the patient is home.  I discussed the limitations, risks, security and privacy concerns of performing an evaluation and management service by telephone and the availability of in person appointments. I also discussed with the patient that there may be a patient responsible charge related to this service. The patient expressed understanding and agreed to proceed.   I discussed the assessment and treatment plan with the patient. The patient was provided an opportunity to ask questions and all were answered. The patient agreed with the plan and demonstrated an understanding of the instructions.   The patient was advised to call back or seek an in-person evaluation if the symptoms worsen or if the condition fails to improve as anticipated.  I provided 20  minutes of non-face-to-face time during this encounter.  HISTORY/CURRENT STATUS: HPI For routine med check.   Is having a lot of family 'drama.' Her dtr, one of her step-dtrs have turned on her.  "I wonder when I'm ever going to get any peace.  I do not know what I have done to cause them to treat me like this."  She does cry a lot sometimes but not all the time.  Her energy and motivation are low but partly due to the multiple sclerosis.  She has been put on a new medication for the MS and she states that is helping a lot.  She is not shaking like she was which makes her feel better physically and mentally.  She is still using a walker to  get around but she has fallen 5 times in the past 3 weeks.  Likes to stay in bed when "all the drama is going on from my family."  Able to fall asleep and usually stays asleep.  Denies suicidal or homicidal thoughts.  The neurologist increased her Klonopin.  He prescribes that for her.  Leniya has really wanted to get off that and we have tried weaning her but she does have a lot of anxiety and I agree that she needs to stay on it.  It was increased recently due to anxiety related to steroid use.  There are no changes in neurologic or musculoskeletal review of systems.  See above.  Individual Medical History/ Review of Systems: Changes? :No    Past medications for mental health diagnoses include: Depakote, Lamictal, Risperdal, Celexa, Klonopin, trazodone, Wellbutrin, Vistaril, primidone, gabapentin, Prozac, Zyprexa, Ambien, Lunesta, Effexor  Allergies: Hydrocodone  Current Medications:  Current Outpatient Medications:  .  amLODipine (NORVASC) 10 MG tablet, Take 1 tablet (10 mg total) by mouth daily., Disp: 90 tablet, Rfl: 1 .  baclofen (LIORESAL) 10 MG tablet, Take 10 mg by mouth 3 (three) times daily., Disp: , Rfl:  .  benzonatate (TESSALON) 200 MG capsule, Take 1 capsule (200 mg total) by mouth 2 (two) times daily as needed for cough., Disp: 20 capsule, Rfl: 0 .  buPROPion (WELLBUTRIN SR) 200 MG 12 hr tablet, Take 1 tablet (200 mg total) by mouth 2 (two) times daily., Disp:  60 tablet, Rfl: 5 .  citalopram (CELEXA) 40 MG tablet, Take 1 tablet (40 mg total) by mouth daily., Disp: 30 tablet, Rfl: 5 .  clonazePAM (KLONOPIN) 1 MG tablet, Take 1 mg by mouth at bedtime. , Disp: , Rfl:  .  dalfampridine 10 MG TB12, , Disp: , Rfl:  .  fluticasone furoate-vilanterol (BREO ELLIPTA) 100-25 MCG/INH AEPB, Inhale 1 puff into the lungs daily., Disp: 1 each, Rfl: 0 .  losartan (COZAAR) 25 MG tablet, Take 1 tablet (25 mg total) by mouth daily., Disp: 90 tablet, Rfl: 0 .  mirtazapine (REMERON) 15 MG tablet,  TAKE 0.5-1 TABLETS (7.5-15 MG TOTAL) BY MOUTH AT BEDTIME AS NEEDED., Disp: 90 tablet, Rfl: 0 .  Nebulizers (COMPRESSOR/NEBULIZER) MISC, Use as directed, Disp: 1 each, Rfl: 0 .  primidone (MYSOLINE) 250 MG tablet, Take 250 mg by mouth 3 (three) times daily. , Disp: , Rfl:  .  PROAIR HFA 108 (90 Base) MCG/ACT inhaler, INHALE 2 PUFFS INTO THE LUNGS EVERY 4 (FOUR) HOURS AS NEEDED FOR WHEEZING OR SHORTNESS OF BREATH., Disp: 8.5 Inhaler, Rfl: 1 .  riTUXimab (RITUXAN) 100 MG/10ML injection, Inject into the vein., Disp: , Rfl:  .  tiZANidine (ZANAFLEX) 4 MG capsule, , Disp: , Rfl:  .  aspirin EC 81 MG EC tablet, Take 1 tablet (81 mg total) by mouth daily. (Patient not taking: Reported on 07/25/2019), Disp: 90 tablet, Rfl: 0 .  atorvastatin (LIPITOR) 40 MG tablet, TAKE 1 TABLET (40 MG TOTAL) BY MOUTH DAILY AT 6 PM. (Patient not taking: Reported on 07/25/2019), Disp: 30 tablet, Rfl: 1 .  gabapentin (NEURONTIN) 300 MG capsule, Take 2 capsules (600 mg total) by mouth 3 (three) times daily. (Patient taking differently: Take 900 mg by mouth 3 (three) times daily. ), Disp: 180 capsule, Rfl: 0 .  ipratropium-albuterol (DUONEB) 0.5-2.5 (3) MG/3ML SOLN, 1 neb every 6 hours as needed for help breathing (Patient not taking: Reported on 07/25/2019), Disp: 75 mL, Rfl: 12 .  omeprazole (PRILOSEC) 40 MG capsule, TAKE 1 CAPSULE BY MOUTH EVERY DAY (Patient not taking: Reported on 07/25/2019), Disp: 90 capsule, Rfl: 1 .  ondansetron (ZOFRAN-ODT) 4 MG disintegrating tablet, TAKE 1 TABLET BY MOUTH EVERY 8 HOURS AS NEEDED FOR NAUSEA AND VOMITING (Patient not taking: Reported on 07/25/2019), Disp: 30 tablet, Rfl: 2 .  Oxycodone HCl 10 MG TABS, Take 1 tablet (10 mg total) by mouth every 6 (six) hours as needed ((score 7 to 10)). (Patient not taking: Reported on 07/25/2019), Disp: 120 tablet, Rfl: 0 .  pseudoephedrine-acetaminophen (TYLENOL SINUS) 30-500 MG TABS tablet, Take 1 tablet by mouth daily as needed (pain)., Disp: , Rfl:  .   sucralfate (CARAFATE) 1 GM/10ML suspension, TAKE 10 MLS (1 G TOTAL) BY MOUTH EVERY 8 (EIGHT) HOURS AS NEEDED. (Patient not taking: Reported on 07/25/2019), Disp: 420 mL, Rfl: 3 .  tizanidine (ZANAFLEX) 2 MG capsule, Take 2 mg by mouth every 8 (eight) hours as needed for muscle spasms. , Disp: , Rfl:  .  valACYclovir (VALTREX) 1000 MG tablet, TAKE 1 TABLET 2 TIMES DAILY. THEN STAY ON ONE TABLET DAILY AS PREVENTION (Patient not taking: Reported on 07/25/2019), Disp: 40 tablet, Rfl: 5 Medication Side Effects: none  Family Medical/ Social History: Changes? No  MENTAL HEALTH EXAM:  There were no vitals taken for this visit.There is no height or weight on file to calculate BMI.  General Appearance: unable to assess  Eye Contact:  unable to assess  Speech:  Clear and Coherent and  Slow  Volume:  Normal  Mood:  sad  Affect:  unable to assess  Thought Process:  Goal Directed and Descriptions of Associations: Intact  Orientation:  Full (Time, Place, and Person)  Thought Content: Logical   Suicidal Thoughts:  No  Homicidal Thoughts:  No  Memory:  WNL  Judgement:  Good  Insight:  Good  Psychomotor Activity:  unable to assess  Concentration:  Concentration: Good  Recall:  Good  Fund of Knowledge: Good  Language: Good  Assets:  Desire for Improvement  ADL's:  Impaired d/t MS, 'it's too much trouble to take a shower so I usually just bathe off. My husband brings me food from town, but I can get up and get something from the kitchen.'  Cognition: WNL  Prognosis:  Fair    DIAGNOSES:    ICD-10-CM   1. Major depressive disorder, recurrent episode, moderate (HCC)  F33.1   2. Generalized anxiety disorder  F41.1   3. Insomnia, unspecified type  G47.00     Receiving Psychotherapy: Yes With [name], LCSW.   RECOMMENDATIONS:  PDMP was reviewed. I spent 20 minutes with her nonface-to-face. Advised her to talk with her neurologist about the falls. We talked about the situation in her family,  as well as her physical disabilities.  Both of these things cause the depression and anxiety to be worse.  At this point, I feel like counseling will be most beneficial and leaving her medications as they are is the best approach.  She agrees.  She prefers not to add any other medications. Continue Wellbutrin SR 200 mg, 1 p.o. twice daily. Continue Celexa 40 mg, 1 p.o. daily. Continue Klonopin 1 mg 3 times daily as needed per neuro. Continue gabapentin 300 mg, 3 p.o. 3 times daily. Continue mirtazapine 15 mg, 1/2-1 nightly as needed. Continue therapy with [name], LCSW. Return in 2 months. Donnal Moat, PA-C

## 2019-07-26 ENCOUNTER — Ambulatory Visit: Payer: Medicare PPO | Admitting: Physical Therapy

## 2019-07-30 ENCOUNTER — Other Ambulatory Visit: Payer: Self-pay | Admitting: Internal Medicine

## 2019-07-31 ENCOUNTER — Ambulatory Visit: Payer: Medicare PPO | Attending: Psychiatry | Admitting: Physical Therapy

## 2019-07-31 ENCOUNTER — Encounter: Payer: Self-pay | Admitting: Physical Therapy

## 2019-07-31 ENCOUNTER — Other Ambulatory Visit: Payer: Self-pay

## 2019-07-31 DIAGNOSIS — R2681 Unsteadiness on feet: Secondary | ICD-10-CM | POA: Diagnosis not present

## 2019-07-31 DIAGNOSIS — R260 Ataxic gait: Secondary | ICD-10-CM | POA: Insufficient documentation

## 2019-07-31 DIAGNOSIS — M6281 Muscle weakness (generalized): Secondary | ICD-10-CM | POA: Insufficient documentation

## 2019-07-31 NOTE — Therapy (Signed)
[facility] [place], [place], [address] Phone: [phone]   Fax:  [phone]  Physical Therapy Treatment  Patient Details  Name: Angel Maddox MRN: [id number] Date of Birth: 09/16/1955 Referring Provider (PT): Delphia Grates, MD   Encounter Date: 07/31/2019  PT End of Session - 07/31/19 1118    Visit Number  17    Number of Visits  24    Date for PT Re-Evaluation  09/13/19    PT Start Time  1118    PT Stop Time  1153    PT Time Calculation (min)  35 min    Equipment Utilized During Treatment  Other (comment)   Rollator   Activity Tolerance  Patient limited by fatigue    Behavior During Therapy  [facility] for tasks assessed/performed       Past Medical History:  Diagnosis Date  . Anxiety   . Arthritis    osteo  . Bipolar 1 disorder ([place])   . Cervicalgia   . Chronic kidney disease    kidney stone  . COPD (chronic obstructive pulmonary disease) ([place])    new diagnosis- now sees pulmonologist  . Depression   . Elevated liver enzymes    She says "normal now"  . GERD (gastroesophageal reflux disease)   . H/O hiatal hernia   . Headache   . Hip fracture ([place]) 2017   left  . History of kidney stones    4-5 yrs ago  . Hypertension   . Multiple sclerosis ([place])     Had for 15 years  . Multiple sclerosis ([place])    dx 1999  . Tremors of nervous system     Past Surgical History:  Procedure Laterality Date  . ANTERIOR CERVICAL DECOMP/DISCECTOMY FUSION N/A 07/28/2017   Procedure: CERVICAL FOUR-FIVE, CERVICAL FIVE-SIX ANTERIOR CERVICAL DECOMPRESSION/DISCECTOMY FUSION;  Surgeon: Eustace Moore, MD;  Location: [facility];  Service: Neurosurgery;  Laterality: N/A;  . COLONOSCOPY  2008   Dr.Kaplan  . EYE SURGERY     [facility]  . HIP PINNING,CANNULATED Left 11/09/2015   Procedure: CANNULATED HIP PINNING;  Surgeon: Rod Can, MD;  Location: [facility];  Service: Orthopedics;  Laterality: Left;  . LIPOMA  EXCISION    . TUBAL LIGATION      There were no vitals filed for this visit.  Subjective Assessment - 07/31/19 1116    Subjective  COVID 19 screening performed on patient upon arrival. Reports that she felt sick last week which may have been from the infusion and limited with HEP. Reports swelling in B ankles at night as she can tell from her socks. Reports 9/10 on fatigue scale. Reports a fall last week after a chair mishap and hurt her back.    Pertinent History  MS, tremors, HTN, COPD, bipolar disorder, lumbar DDD, cervical fusion, history of right hip fracture & pinning    Limitations  Standing;Walking;House hold activities    Patient Stated Goals  improve mobility and improve strength in UEs and LEs and function    Currently in Pain?  No/denies         [facility] PT Assessment - 07/31/19 0001      Assessment   Medical Diagnosis  Multiple sclerosis    Referring Provider (PT)  Delphia Grates, MD    Next MD Visit  Late November 2020    Prior Therapy  yes      Precautions   Precautions  Fall    Precaution Comments  Ataxic  gait; close supervision to min/mod A for ambulation and transfers at all times                   [facility] Adult PT Treatment/Exercise - 07/31/19 0001      Knee/Hip Exercises: Seated   Long Arc Quad  AROM;Both;2 sets;10 reps    [facility]  x20 reps    Clamshell with TheraBand  --   x20 reps AROM   Other Seated Knee/Hip Exercises  B LE D2 in sitting 2x5 reps    Marching  AROM;Both;2 sets;10 reps    Hamstring Curl  AROM;Both;2 sets;10 reps    Sit to Sand  5 reps;with UE support      Knee/Hip Exercises: Supine   Heel Slides  AROM;Both;15 reps    Bridges  15 reps                  PT Long Term Goals - 07/31/19 1141      PT LONG TERM GOAL #1   Title  Patient will be independent with HEP.    Time  6    Period  Weeks    Status  Achieved      PT LONG TERM GOAL #2   Title  Patient will demonstrate modified 5x sit to stand test in 45  seconds or less to decrease fall risk and improve functional LE strength.    Period  Weeks    Status  On-going   5x sit to stands in 55 seconds 07/31/2019     PT LONG TERM GOAL #3   Title  Patient will ambulate 150 feet with rollator and close supervision to safely ambulate home.    Time  6    Period  Weeks    Status  On-going      PT LONG TERM GOAL #4   Title  Patient will demonstrate 4/5 or greater bilateral LE MMT to improve stablity during functional tasks.    Time  6    Period  Weeks    Status  On-going      PT LONG TERM GOAL #5   Title  Patient will report ability to perform ADLs with assistance with global pain less than or equal to 5/10.    Time  6    Period  Weeks    Status  Achieved            Plan - 07/31/19 1205    Clinical Impression Statement  Patient presented in clinic with reports of 9/10 on fatigue scale. Patient able to complete therex with less spasticity at end range. Reps of sit > stands were deficient secondary to weakness and she was able to complete in approximately 55 seconds. More supine exercises completed but limited due to fatigue. Gait was supervised following end of treatment with slow pace but little to no spasticity.    Personal Factors and Comorbidities  Comorbidity 3+;Time since onset of injury/illness/exacerbation    Examination-Activity Limitations  Bed Mobility;Locomotion Level;Transfers;Hygiene/Grooming    Stability/Clinical Decision Making  Evolving/Moderate complexity    Rehab Potential  Fair    PT Frequency  2x / week    PT Duration  6 weeks    PT Treatment/Interventions  ADLs/Self Care Home Management;Cryotherapy;Electrical Stimulation;Moist Heat;Balance training;Therapeutic exercise;Therapeutic activities;Functional mobility training;Stair training;Gait training;Neuromuscular re-education;Patient/family education;Manual techniques;Passive range of motion    PT Next Visit Plan  Initate LE supine PNF patterns along with supine  strengthening/AROM exercises.    PT Home Exercise Plan  see patient education section    Consulted and Agree with Plan of Care  Patient       Patient will benefit from skilled therapeutic intervention in order to improve the following deficits and impairments:  Abnormal gait, Decreased activity tolerance, Decreased balance, Pain, Impaired tone, Difficulty walking, Decreased strength, Decreased mobility, Increased muscle spasms  Visit Diagnosis: Unsteadiness on feet  Muscle weakness (generalized)  Ataxic gait     Problem List Patient Active Problem List   Diagnosis Date Noted  . 'light-for-dates' infant with signs of fetal malnutrition 05/06/2019  . Seizure-like activity ([place]) 12/27/2018  . Dyslipidemia 12/20/2018  . Hyponatremia syndrome 07/04/2018  . DOE (dyspnea on exertion) 07/03/2018  . COPD mixed type ([place]) 07/03/2018  . MDD (major depressive disorder) 06/28/2018  . GAD (generalized anxiety disorder) 06/28/2018  . Major depressive disorder, recurrent episode, moderate ([place]) 04/17/2018  . Head injury 12/08/2017  . Cervical vertebral fusion 07/28/2017  . DDD (degenerative disc disease), cervical 07/10/2017  . DDD (degenerative disc disease), thoracic 07/10/2017  . DDD (degenerative disc disease), lumbar 07/10/2017  . Facet arthritis of cervical region 07/10/2017  . Abnormal liver function tests 03/07/2017  . Hypokalemia 03/07/2017  . Gastroesophageal reflux disease with esophagitis 03/07/2017  . Esophageal dysphagia 03/07/2017  . Chronic idiopathic constipation 02/01/2017  . Fatigue 02/01/2017  . HSV-1 infection 01/12/2017  . Osteoporosis with pathological fracture, with routine healing, subsequent encounter 08/30/2016  . MDD (major depressive disorder), recurrent episode, severe ([place]) 03/29/2016  . Estrogen deficiency 12/15/2015  . MS (multiple sclerosis) ([place]) 12/15/2015  . Tremor 12/15/2015  . Healthcare maintenance 12/15/2015  . Essential hypertension   .  Protein-calorie malnutrition, severe 11/09/2015  . Bipolar 1 disorder, depressed ([place]) 02/11/2015    Standley Brooking, PTA 07/31/2019, 12:10 PM  [facility] [address] [place], [place], [address] Phone: [phone]   Fax:  [phone]  Name: JAISHA JAPP MRN: [id number] Date of Birth: 09-29-1955

## 2019-08-01 ENCOUNTER — Other Ambulatory Visit: Payer: Self-pay | Admitting: Internal Medicine

## 2019-08-01 NOTE — Telephone Encounter (Signed)
Rx for 90 days sent.

## 2019-08-07 ENCOUNTER — Other Ambulatory Visit: Payer: Self-pay | Admitting: Internal Medicine

## 2019-08-07 ENCOUNTER — Ambulatory Visit: Payer: Medicare PPO | Admitting: Physical Therapy

## 2019-08-07 ENCOUNTER — Other Ambulatory Visit: Payer: Self-pay | Admitting: Physician Assistant

## 2019-08-07 DIAGNOSIS — R0609 Other forms of dyspnea: Secondary | ICD-10-CM

## 2019-08-09 ENCOUNTER — Other Ambulatory Visit: Payer: Self-pay

## 2019-08-09 ENCOUNTER — Encounter: Payer: Self-pay | Admitting: Physical Therapy

## 2019-08-09 ENCOUNTER — Ambulatory Visit: Payer: Medicare PPO | Admitting: Physical Therapy

## 2019-08-09 DIAGNOSIS — R2681 Unsteadiness on feet: Secondary | ICD-10-CM | POA: Diagnosis not present

## 2019-08-09 DIAGNOSIS — M6281 Muscle weakness (generalized): Secondary | ICD-10-CM

## 2019-08-09 DIAGNOSIS — R260 Ataxic gait: Secondary | ICD-10-CM

## 2019-08-09 NOTE — Therapy (Signed)
[facility] [place], [place], [address] Phone: [phone]   Fax:  [phone]  Physical Therapy Treatment  Patient Details  Name: Angel Maddox MRN: [id number] Date of Birth: 1956/06/25 Referring Provider (PT): Delphia Grates, MD   Encounter Date: 08/09/2019  PT End of Session - 08/09/19 1043    Visit Number  18    Number of Visits  24    Date for PT Re-Evaluation  09/13/19    PT Start Time  [id number]    PT Stop Time  1114   limited by fatigue   PT Time Calculation (min)  31 min    Equipment Utilized During Treatment  Other (comment)   Rollator   Activity Tolerance  Patient limited by fatigue    Behavior During Therapy  [facility] for tasks assessed/performed       Past Medical History:  Diagnosis Date  . Anxiety   . Arthritis    osteo  . Bipolar 1 disorder ([place])   . Cervicalgia   . Chronic kidney disease    kidney stone  . COPD (chronic obstructive pulmonary disease) ([place])    new diagnosis- now sees pulmonologist  . Depression   . Elevated liver enzymes    She says "normal now"  . GERD (gastroesophageal reflux disease)   . H/O hiatal hernia   . Headache   . Hip fracture ([place]) 2017   left  . History of kidney stones    4-5 yrs ago  . Hypertension   . Multiple sclerosis ([place])     Had for 15 years  . Multiple sclerosis ([place])    dx 1999  . Tremors of nervous system     Past Surgical History:  Procedure Laterality Date  . ANTERIOR CERVICAL DECOMP/DISCECTOMY FUSION N/A 07/28/2017   Procedure: CERVICAL FOUR-FIVE, CERVICAL FIVE-SIX ANTERIOR CERVICAL DECOMPRESSION/DISCECTOMY FUSION;  Surgeon: Eustace Moore, MD;  Location: [place];  Service: Neurosurgery;  Laterality: N/A;  . COLONOSCOPY  2008   Dr.Kaplan  . E[facility] SURGERY     [facility]  . HIP PINNING,CANNULATED Left 11/09/2015   Procedure: CANNULATED HIP PINNING;  Surgeon: Rod Can, MD;  [facility];  Service: Orthopedics;  Laterality:  Left;  . LIPOMA EXCISION    . TUBAL LIGATION      There were no vitals filed for this visit.  Subjective Assessment - 08/09/19 1041    Subjective  COVID 19 screening performed on patient upon arrival. Reports that she went to a funeral yesterday and she overdid it. Reports that her RLE is dragging since yesterday.    Pertinent History  MS, tremors, HTN, COPD, bipolar disorder, lumbar DDD, cervical fusion, history of right hip fracture & pinning    Limitations  Standing;Walking;House hold activities    Patient Stated Goals  improve mobility and improve strength in UEs and LEs and function    Currently in Pain?  No/denies         [facility] PT Assessment - 08/09/19 0001      Assessment   Medical Diagnosis  Multiple sclerosis    Referring Provider (PT)  Delphia Grates, MD    Next MD Visit  Late November 2020    Prior Therapy  yes      Precautions   Precautions  Fall    Precaution Comments  Ataxic gait; close supervision to min/mod A for ambulation and transfers at all times  [facility] Adult PT Treatment/Exercise - 08/09/19 0001      Knee/Hip Exercises: Seated   Long Arc Quad  AROM;Both;2 sets;10 reps    Clamshell with TheraBand  Yellow   x20 reps   Marching  AROM;Both;2 sets;10 reps      Knee/Hip Exercises: Supine   Heel Slides  AROM;Both;2 sets;10 reps    Hip Adduction Isometric  Strengthening;2 sets;10 reps    Bridges  15 reps    Straight Leg Raises  AROM;Both;2 sets;10 reps    Other Supine Knee/Hip Exercises  BLE D2 in supine with minimal resistance  x5 reps each                  PT Long Term Goals - 07/31/19 1141      PT LONG TERM GOAL #1   Title  Patient will be independent with HEP.    Time  6    Period  Weeks    Status  Achieved      PT LONG TERM GOAL #2   Title  Patient will demonstrate modified 5x sit to stand test in 45 seconds or less to decrease fall risk and improve functional LE strength.    Period  Weeks    Status   On-going   5x sit to stands in 55 seconds 07/31/2019     PT LONG TERM GOAL #3   Title  Patient will ambulate 150 feet with rollator and close supervision to safely ambulate home.    Time  6    Period  Weeks    Status  On-going      PT LONG TERM GOAL #4   Title  Patient will demonstrate 4/5 or greater bilateral LE MMT to improve stablity during functional tasks.    Time  6    Period  Weeks    Status  On-going      PT LONG TERM GOAL #5   Title  Patient will report ability to perform ADLs with assistance with global pain less than or equal to 5/10.    Time  6    Period  Weeks    Status  Achieved            Plan - 08/09/19 1137    Clinical Impression Statement  Patient presented in clinic with reports of fatigue after strenous last few days with emotional stress and activity. More supine strengthening exercises completed along with core incorporation. Minimal resistance provided during resisted D2 strengthening. Mod assist required for SLRs today. Small step and stride length noted during gait following end of treatment but better foot clearance noted.    Personal Factors and Comorbidities  Comorbidity 3+;Time since onset of injury/illness/exacerbation    Examination-Activity Limitations  Bed Mobility;Locomotion Level;Transfers;Hygiene/Grooming    Stability/Clinical Decision Making  Evolving/Moderate complexity    Rehab Potential  Fair    PT Frequency  2x / week    PT Duration  6 weeks    PT Treatment/Interventions  ADLs/Self Care Home Management;Cryotherapy;Electrical Stimulation;Moist Heat;Balance training;Therapeutic exercise;Therapeutic activities;Functional mobility training;Stair training;Gait training;Neuromuscular re-education;Patient/family education;Manual techniques;Passive range of motion    PT Next Visit Plan  Initate LE supine PNF patterns along with supine strengthening/AROM exercises.    PT Home Exercise Plan  see patient education section    Consulted and Agree with  Plan of Care  Patient       Patient will benefit from skilled therapeutic intervention in order to improve the following deficits and impairments:  Abnormal gait, Decreased activity tolerance,  Decreased balance, Pain, Impaired tone, Difficulty walking, Decreased strength, Decreased mobility, Increased muscle spasms  Visit Diagnosis: Unsteadiness on feet  Muscle weakness (generalized)  Ataxic gait     Problem List Patient Active Problem List   Diagnosis Date Noted  . 'light-for-dates' infant with signs of fetal malnutrition 05/06/2019  . Seizure-like activity ([place]) 12/27/2018  . Dyslipidemia 12/20/2018  . Hyponatremia syndrome 07/04/2018  . DOE (dyspnea on exertion) 07/03/2018  . COPD mixed type ([place]) 07/03/2018  . MDD (major depressive disorder) 06/28/2018  . GAD (generalized anxiety disorder) 06/28/2018  . Major depressive disorder, recurrent episode, moderate ([place]) 04/17/2018  . Head injury 12/08/2017  . Cervical vertebral fusion 07/28/2017  . DDD (degenerative disc disease), cervical 07/10/2017  . DDD (degenerative disc disease), thoracic 07/10/2017  . DDD (degenerative disc disease), lumbar 07/10/2017  . Facet arthritis of cervical region 07/10/2017  . Abnormal liver function tests 03/07/2017  . Hypokalemia 03/07/2017  . Gastroesophageal reflux disease with esophagitis 03/07/2017  . Esophageal dysphagia 03/07/2017  . Chronic idiopathic constipation 02/01/2017  . Fatigue 02/01/2017  . HSV-1 infection 01/12/2017  . Osteoporosis with pathological fracture, with routine healing, subsequent encounter 08/30/2016  . MDD (major depressive disorder), recurrent episode, severe ([place]) 03/29/2016  . Estrogen deficiency 12/15/2015  . MS (multiple sclerosis) ([place]) 12/15/2015  . Tremor 12/15/2015  . Healthcare maintenance 12/15/2015  . Essential hypertension   . Protein-calorie malnutrition, severe 11/09/2015  . Bipolar 1 disorder, depressed ([place]) 02/11/2015    Standley Brooking, PTA 08/09/2019, 11:46 AM  [facility] [address] [place], [place], [address] Phone: [phone]   Fax:  [phone]  Name: Angel Maddox MRN: [id number] Date of Birth: [birth year]

## 2019-08-12 ENCOUNTER — Other Ambulatory Visit: Payer: Self-pay

## 2019-08-12 ENCOUNTER — Ambulatory Visit (INDEPENDENT_AMBULATORY_CARE_PROVIDER_SITE_OTHER): Payer: Medicare PPO | Admitting: Psychiatry

## 2019-08-12 DIAGNOSIS — F331 Major depressive disorder, recurrent, moderate: Secondary | ICD-10-CM

## 2019-08-12 NOTE — Progress Notes (Signed)
[facility] Counselor/Therapist Progress Note  Patient ID: Angel Maddox, MRN: [id number],    Date: 08/12/2019  Time Spent: 60 minutes   12:00noon to 1:00pm  Treatment Type: Individual Therapy  Reported Symptoms: anxiety, depression, tearful, angry with daughter, frustrated  Mental Status Exam:  Appearance:   Neat     Behavior:  Appropriate and Sharing  Motor:  some rigidity and difficulty moving with her MS  Speech/Language:   Normal Rate  Affect:  Depressed, Tearful and anxious  Mood:  anxious, depressed and sad  Thought process:  goal directed  Thought content:    WNL  Sensory/Perceptual disturbances:    WNL  Orientation:  oriented to person, place, time/date, situation, day of week, month of year and year  Attention:  Good  Concentration:  Fair  Memory:  some forgetfulness  Fund of knowledge:   Good  Insight:    Good / Fair  Judgment:   Good / Fair  Impulse Control:  Good   Risk Assessment: Danger to Self:  No Self-injurious Behavior: No Danger to Others: No Duty to Warn:no Physical Aggression / Violence:No  Access to Firearms a concern: No  Gang Involvement:No   Subjective: Patient in today sharing that "my daughter and I had a big falling-out after a family event and we're not speaking to each other right now."  "She blames it al on me, and I take her bad behavior and cussing me out ever since high school."  Angry, hurt.   Interventions: Cognitive Behavioral Therapy and Solution-Oriented/Positive Psychology  Diagnosis:   ICD-10-CM   1. Major depressive disorder, recurrent episode, moderate ([place])  F33.1      Plan: Patient not signing tx plan updates on computer screen due to [place].   Treatment Goals: Goals may remain on tx plan as patient works on strategies to meet her goals. Progress will be documented each session in "Progress" section.  Long term goal: Develop the ability to recognize, accept, and cope with feelings of depressionand  anxiety.  Short term goal: Learn and implement calming skills to reduce overall tension and moments of increased anxiety and/or depression.Patient will recognize her negative self-talk, interrupt it, and not let it prevent her from trying new strategies to help herself.  Strategy: Teach patient cognitive and somatic calming skills . Rehearse with patient how to apply these skills to her daily life. Review and reinforce success while providing corrective feedback toward consistent implementation.  PROGRESS: Patient "realizing I can't change her" but also not sure how much she is willing to work on relationship with her daughter, especially after the incident cited above in "Subjective" section. Patient expressing lots of anger and fear at her daughter, but it also seems a lot of her expression is about her fears and uncertainty with her MS seeming to be worsening.  Anxious and she admits to being scared about her physical condition getting worse. Very tearful today in talking about this and" feeling alone within her family as husband is the only person in family that offers to help me".  Angry, hurt and does a good job of expressing these feelings today. Has fallen several more times and that has scared her more, understandably.  States that she has been in touch with her doctor and he is aware of her falls. Hard for her to sometimes accept support and today is one of them.  Denies SI. Tried to help her look at what might help her most immediately and her frustration  and anger dissipated as she was more able recognize support offered to her in this session.  Is trying to look more at "what I can control and what I can't", as well as staying in the present versus jumping ahead which escalates her anxiety and worries. Reviewed more positive/self-supportive self-talk which she seemed to appreciate.  Goal review and progress/challenges noted with patient.    Next appt within 2-3 weeks.   [name], LCSW

## 2019-08-14 ENCOUNTER — Other Ambulatory Visit: Payer: Self-pay

## 2019-08-14 ENCOUNTER — Encounter: Payer: Self-pay | Admitting: Physical Therapy

## 2019-08-14 ENCOUNTER — Ambulatory Visit: Payer: Medicare PPO | Admitting: Physical Therapy

## 2019-08-14 ENCOUNTER — Telehealth: Payer: Self-pay | Admitting: Internal Medicine

## 2019-08-14 DIAGNOSIS — R2681 Unsteadiness on feet: Secondary | ICD-10-CM | POA: Diagnosis not present

## 2019-08-14 DIAGNOSIS — R260 Ataxic gait: Secondary | ICD-10-CM

## 2019-08-14 DIAGNOSIS — M6281 Muscle weakness (generalized): Secondary | ICD-10-CM

## 2019-08-14 NOTE — Telephone Encounter (Signed)
Patient has questions about a UTI. Hurting, burning when urinating and afterwards. Please call patient back.

## 2019-08-14 NOTE — Telephone Encounter (Signed)
RTC to patient, states she called her neurologist and was able to get a RX called in for a UTI. SChaplin, RN,BSN

## 2019-08-14 NOTE — Therapy (Addendum)
[facility] [place], [place], [address] Phone: [phone]   Fax:  [phone]  Physical Therapy Treatment PHYSICAL THERAPY DISCHARGE SUMMARY  Visits from Start of Care: 19  Current functional level related to goals / functional outcomes: See  below   Remaining deficits: See goals   Education / Equipment: HEP  Plan: Patient agrees to discharge.  Patient goals were not met. Patient is being discharged due to lack of progress.  ?????  [name], PT, DPT   Patient Details  Name: Angel Maddox MRN: [id number] Date of Birth: 06/15/1956 Referring Provider (PT): Delphia Grates, MD   Encounter Date: 08/14/2019  PT End of Session - 08/14/19 1121    Visit Number  19    Number of Visits  24    Date for PT Re-Evaluation  09/13/19    PT Start Time  1121    PT Stop Time  1156    PT Time Calculation (min)  35 min    Equipment Utilized During Treatment  Other (comment)   Rollator   Activity Tolerance  Patient limited by fatigue    Behavior During Therapy  [facility] for tasks assessed/performed       Past Medical History:  Diagnosis Date  . Anxiety   . Arthritis    osteo  . Bipolar 1 disorder ([place])   . Cervicalgia   . Chronic kidney disease    kidney stone  . COPD (chronic obstructive pulmonary disease) ([place])    new diagnosis- now sees pulmonologist  . Depression   . Elevated liver enzymes    She says "normal now"  . GERD (gastroesophageal reflux disease)   . H/O hiatal hernia   . Headache   . Hip fracture ([place]) 2017   left  . History of kidney stones    4-5 yrs ago  . Hypertension   . Multiple sclerosis ([place])     Had for 15 years  . Multiple sclerosis ([place])    dx 1999  . Tremors of nervous system     Past Surgical History:  Procedure Laterality Date  . ANTERIOR CERVICAL DECOMP/DISCECTOMY FUSION N/A 07/28/2017   Procedure: CERVICAL FOUR-FIVE, CERVICAL FIVE-SIX ANTERIOR CERVICAL  DECOMPRESSION/DISCECTOMY FUSION;  Surgeon: Eustace Moore, MD;  Location: [place];  Service: Neurosurgery;  Laterality: N/A;  . COLONOSCOPY  2008   Dr.Kaplan  . EYE SURGERY     [facility]  . HIP PINNING,CANNULATED Left 11/09/2015   Procedure: CANNULATED HIP PINNING;  Surgeon: Rod Can, MD;  [facility];  Service: Orthopedics;  Laterality: Left;  . LIPOMA EXCISION    . TUBAL LIGATION      There were no vitals filed for this visit.  Subjective Assessment - 08/14/19 1119    Subjective  COVID 19 screening performed on patient upon arrival. Patient reports she has fallen 3 times since last PT visit. She has been sore from falls,. Rated fatigue as 2/10 but states that she never feels like she wants to do anything.    Pertinent History  MS, tremors, HTN, COPD, bipolar disorder, lumbar DDD, cervical fusion, history of right hip fracture & pinning    Limitations  Standing;Walking;House hold activities    Patient Stated Goals  improve mobility and improve strength in UEs and LEs and function    Currently in Pain?  Yes    Pain Score  4     Pain Location  Hip    Pain Orientation  Left;Right  Pain Descriptors / Indicators  Sore    Pain Type  Acute pain    Pain Onset  More than a month ago    Pain Frequency  Constant         [facility] PT Assessment - 08/14/19 0001      Assessment   Medical Diagnosis  Multiple sclerosis    Referring Provider (PT)  Delphia Grates, MD    Prior Therapy  yes      Precautions   Precautions  Fall    Precaution Comments  Ataxic gait; close supervision to min/mod A for ambulation and transfers at all times                   [facility] Adult PT Treatment/Exercise - 08/14/19 0001      Knee/Hip Exercises: Seated   Long Arc Quad  AROM;Both;2 sets;10 reps    [facility]  x20 reps    Clamshell with TheraBand  Yellow   x20 reps   Other Seated Knee/Hip Exercises  B LE D2 in sitting 1x5 reps    Marching  AROM;Both;2 sets;10 reps     Sit to Sand  with UE support   x3 reps but limited by fatigue            PT Education - 08/14/19 1202    Education Details  Verbal education regarding support system, energy conservation, motorized WC use    Person(s) Educated  Patient    Methods  Explanation    Comprehension  Verbalized understanding          PT Long Term Goals - 07/31/19 1141      PT LONG TERM GOAL #1   Title  Patient will be independent with HEP.    Time  6    Period  Weeks    Status  Achieved      PT LONG TERM GOAL #2   Title  Patient will demonstrate modified 5x sit to stand test in 45 seconds or less to decrease fall risk and improve functional LE strength.    Period  Weeks    Status  On-going   5x sit to stands in 55 seconds 07/31/2019     PT LONG TERM GOAL #3   Title  Patient will ambulate 150 feet with rollator and close supervision to safely ambulate home.    Time  6    Period  Weeks    Status  On-going      PT LONG TERM GOAL #4   Title  Patient will demonstrate 4/5 or greater bilateral LE MMT to improve stablity during functional tasks.    Time  6    Period  Weeks    Status  On-going      PT LONG TERM GOAL #5   Title  Patient will report ability to perform ADLs with assistance with global pain less than or equal to 5/10.    Time  6    Period  Weeks    Status  Achieved            Plan - 08/14/19 1204    Clinical Impression Statement  Safety is becoming a greater concern with patient as she reports she has fallen three times since last PT visit. Patient reports her LEs give out or buckle when she is standing which is causing falls. Patient appeared very down and fatigued upon arrival. Overall limited therex session due to excessive fatigue. Greatest focus of today was education regarding energy conservation,  safety and motorized WC use. Patient is understanding of progressive MS diagnosis but states that husband doesn't want her to use her motorized WC. Importance of safety also  stressed and alleviation noted stress of her husband as caregiver.    Personal Factors and Comorbidities  Comorbidity 3+;Time since onset of injury/illness/exacerbation    Examination-Activity Limitations  Bed Mobility;Locomotion Level;Transfers;Hygiene/Grooming    Stability/Clinical Decision Making  Evolving/Moderate complexity    Rehab Potential  Fair    PT Frequency  2x / week    PT Duration  6 weeks    PT Treatment/Interventions  ADLs/Self Care Home Management;Cryotherapy;Electrical Stimulation;Moist Heat;Balance training;Therapeutic exercise;Therapeutic activities;Functional mobility training;Stair training;Gait training;Neuromuscular re-education;Patient/family education;Manual techniques;Passive range of motion    PT Next Visit Plan  Initate LE supine PNF patterns along with supine strengthening/AROM exercises.    PT Home Exercise Plan  see patient education section    Consulted and Agree with Plan of Care  Patient       Patient will benefit from skilled therapeutic intervention in order to improve the following deficits and impairments:  Abnormal gait, Decreased activity tolerance, Decreased balance, Pain, Impaired tone, Difficulty walking, Decreased strength, Decreased mobility, Increased muscle spasms  Visit Diagnosis: Unsteadiness on feet  Muscle weakness (generalized)  Ataxic gait     Problem List Patient Active Problem List   Diagnosis Date Noted  . 'light-for-dates' infant with signs of fetal malnutrition 05/06/2019  . Seizure-like activity ([place]) 12/27/2018  . Dyslipidemia 12/20/2018  . Hyponatremia syndrome 07/04/2018  . DOE (dyspnea on exertion) 07/03/2018  . COPD mixed type ([place]) 07/03/2018  . MDD (major depressive disorder) 06/28/2018  . GAD (generalized anxiety disorder) 06/28/2018  . Major depressive disorder, recurrent episode, moderate ([place]) 04/17/2018  . Head injury 12/08/2017  . Cervical vertebral fusion 07/28/2017  . DDD (degenerative disc disease),  cervical 07/10/2017  . DDD (degenerative disc disease), thoracic 07/10/2017  . DDD (degenerative disc disease), lumbar 07/10/2017  . Facet arthritis of cervical region 07/10/2017  . Abnormal liver function tests 03/07/2017  . Hypokalemia 03/07/2017  . Gastroesophageal reflux disease with esophagitis 03/07/2017  . Esophageal dysphagia 03/07/2017  . Chronic idiopathic constipation 02/01/2017  . Fatigue 02/01/2017  . HSV-1 infection 01/12/2017  . Osteoporosis with pathological fracture, with routine healing, subsequent encounter 08/30/2016  . MDD (major depressive disorder), recurrent episode, severe ([place]) 03/29/2016  . Estrogen deficiency 12/15/2015  . MS (multiple sclerosis) ([place]) 12/15/2015  . Tremor 12/15/2015  . Healthcare maintenance 12/15/2015  . Essential hypertension   . Protein-calorie malnutrition, severe 11/09/2015  . Bipolar 1 disorder, depressed ([place]) 02/11/2015    Standley Brooking, PTA 08/14/19 12:09 PM   [facility] [address] [place], [place], [address] Phone: [phone]   Fax:  [phone]  Name: Angel Maddox MRN: [id number] Date of Birth: 06-23-1956

## 2019-08-19 ENCOUNTER — Encounter: Payer: Medicare PPO | Admitting: Internal Medicine

## 2019-08-19 ENCOUNTER — Other Ambulatory Visit: Payer: Self-pay | Admitting: Internal Medicine

## 2019-08-21 ENCOUNTER — Ambulatory Visit: Payer: Medicare PPO | Admitting: Physical Therapy

## 2019-08-23 ENCOUNTER — Other Ambulatory Visit: Payer: Self-pay | Admitting: Internal Medicine

## 2019-08-23 DIAGNOSIS — R0609 Other forms of dyspnea: Secondary | ICD-10-CM

## 2019-08-26 ENCOUNTER — Ambulatory Visit: Payer: Medicare PPO | Admitting: Psychiatry

## 2019-08-30 ENCOUNTER — Ambulatory Visit: Payer: Medicare PPO | Admitting: Physical Therapy

## 2019-09-04 ENCOUNTER — Ambulatory Visit: Payer: Medicare PPO | Admitting: Physical Therapy

## 2019-09-04 ENCOUNTER — Other Ambulatory Visit: Payer: Self-pay

## 2019-09-04 DIAGNOSIS — R2681 Unsteadiness on feet: Secondary | ICD-10-CM | POA: Insufficient documentation

## 2019-09-04 DIAGNOSIS — R260 Ataxic gait: Secondary | ICD-10-CM | POA: Insufficient documentation

## 2019-09-04 DIAGNOSIS — M6281 Muscle weakness (generalized): Secondary | ICD-10-CM | POA: Insufficient documentation

## 2019-09-04 NOTE — Therapy (Signed)
[facility] [place], [place], [address] Phone: [phone]   Fax:  [phone]  Patient Details  Name: NINEVEH OLDENKAMP MRN: [id number] Date of Birth: 10-Jun-1956 Referring Provider:  Delice [place], DO  Encounter Date: 09/04/2019  Patient arrived after missing several appointments due to decline and overall unwell. Patient feels constantly fatigued and has fallen so many times in the last weeks that she is unable to keep count. Patient has fallen resulting in soreness and with increased LBP at times. Patient also reports hitting her head in a fall as well. Patient does have a motorized wheelchair but patient's husband does not allow her to use it. Patient lacks support system for physical needs as well as emotional needs. Patient reports lack of HEP completion as well due to generalized fatigue and reports sleeping predominately during the day. Patient states that her husband blames her medication for falls and sleeping pattern. States that as a result she tried to reduce intake of medication and frequency of taking medication but had no effect. Patient advised to speak with her physician tomorrow at neurologist office and attempt to speak along with physician to make them aware of major safety issues she has been experiencing with the falls.  Standley Brooking, PTA 09/04/2019, 12:32 PM  [facility] [address] [place], [place], [address] Phone: [phone]   Fax:  [phone]

## 2019-09-06 ENCOUNTER — Encounter: Payer: Medicare PPO | Admitting: Physical Therapy

## 2019-09-09 ENCOUNTER — Ambulatory Visit (INDEPENDENT_AMBULATORY_CARE_PROVIDER_SITE_OTHER): Payer: Medicare PPO | Admitting: Psychiatry

## 2019-09-09 ENCOUNTER — Other Ambulatory Visit: Payer: Self-pay

## 2019-09-09 DIAGNOSIS — F331 Major depressive disorder, recurrent, moderate: Secondary | ICD-10-CM | POA: Diagnosis not present

## 2019-09-09 NOTE — Progress Notes (Signed)
[facility] Counselor/Therapist Progress Note  Patient ID: Angel Maddox, MRN: [id number],    Date: 09/09/2019  Time Spent: 50 mintues  11:05am to 11:55noon  Treatment Type: Individual Therapy  Reported Symptoms: anxiety, depression, irritable, physically not doing wel with her MS  Mental Status Exam:  Appearance:   Neat     Behavior:  Appropriate and Sharing  Motor:  Normal  Speech/Language:   Normal  Affect:  anxiety, depressed, frustrated  Mood:  anxious, depressed and irritable  Thought process:  goal directed  Thought content:    WNL  Sensory/Perceptual disturbances:    WNL  Orientation:  oriented to person,place, time, month, date, year, situation  Attention:  Good  Concentration:  Good/Fair  Memory:  forgetfulness and her PCP is aware per patient  Fund of knowledge:   Good  Insight:    Good  Judgment:   Good  Impulse Control:  Good   Risk Assessment: Danger to Self:  No Self-injurious Behavior: No Danger to Others: No Duty to Warn:no Physical Aggression / Violence:No  Access to Firearms a concern: No  Gang Involvement:No   Subjective: Patient in today with anxiety, depression, irritability, and some increased physical issues she feels may be related to her MS.  States she's having sleep study soon, and MRI of head/shoulders, EEG, and other testing, and she feels that physical symptoms are aggravating her emotional symptoms. Want to sleep more than usual.   Interventions: Cognitive Behavioral Therapy and Solution-Oriented/Positive Psychology  Diagnosis:   ICD-10-CM   1. Major depressive disorder, recurrent episode, moderate ([place])  F33.1     Plan: Patient not signing tx plan updates on computer screen due to [place].   Treatment Goals: Goals may remain on tx plan as patient works on strategies to meet her goals. Progress will be documented each session in "Progress" section.  Long term goal: Develop the ability to recognize, accept, and cope  with feelings of depressionand anxiety.  Short term goal: Learn and implement calming skills to reduce overall tension and moments of increased anxiety and/or depression.Patient will recognize her negative self-talk, interrupt it, and not let it prevent her from trying new strategies to help herself.  Strategy: Teach patient cognitive and somatic calming skills . Rehearse with patient how to apply these skills to her daily life. Review and reinforce success while providing corrective feedback toward consistent implementation.  PROGRESS: Patient today having some increase in anxiety and frustration, along with depression and irritability. Reports her physical symptoms with her MS have worsened some and doctor has ordered a sleep study, MRI of head and shoulders, EEG, and other testing that patient couldn't remember. Still having dificulties in relationship/communication with other family members and other grandmother of her 2 grandchildren. Shared a couple more confrontations she has had with her daughter. Also having some conflict with others, but daughter seems more negative and hurtful.  Patient very upset by recent conflict with daughter and found it hard to look at different ways of managing her feelings.  Focused in session on calming coping skills including deep breathing exercises, more positive self-talk, working on reducing her anxious/negative thoughts and replacing them with more reality-based, positive, and empowering thoughts that do not support anxiety nor negativity. Is concerned about her physical symptoms worsening some. Husband has been more helpful recently.   Goal review and progress/challenges noted with patient.   Next appt within 3 weeks.   [name], LCSW

## 2019-09-10 ENCOUNTER — Other Ambulatory Visit: Payer: Self-pay | Admitting: Physician Assistant

## 2019-09-10 DIAGNOSIS — G35 Multiple sclerosis: Secondary | ICD-10-CM

## 2019-09-12 ENCOUNTER — Ambulatory Visit: Payer: Medicare PPO | Attending: Psychiatry | Admitting: Physical Therapy

## 2019-09-16 ENCOUNTER — Other Ambulatory Visit: Payer: Self-pay | Admitting: Internal Medicine

## 2019-09-16 NOTE — Telephone Encounter (Signed)
Called pt - stated she only has 1 pill left and no refills. Per med list, Losartan was refilled in Jan 2021.  [facility] - stated there's I rx on file; and they will get it ready for her.  Called pt back - no answer; left message she has 1 rx on file and to call [facility] a little later.

## 2019-09-16 NOTE — Telephone Encounter (Signed)
Need refill on losartan (COZAAR) 25 MG tablet ;pt contact [phone]   [id number] - [place], [place] [facility] [address]

## 2019-09-18 ENCOUNTER — Encounter: Payer: Medicare PPO | Admitting: Physical Therapy

## 2019-09-23 ENCOUNTER — Ambulatory Visit (INDEPENDENT_AMBULATORY_CARE_PROVIDER_SITE_OTHER): Payer: Medicare PPO | Admitting: Psychiatry

## 2019-09-23 ENCOUNTER — Ambulatory Visit (HOSPITAL_COMMUNITY)
Admission: AD | Admit: 2019-09-23 | Discharge: 2019-09-23 | Disposition: A | Discharge status: Home / Self Care | Payer: Medicare PPO | Attending: Psychiatry | Admitting: Psychiatry

## 2019-09-23 ENCOUNTER — Other Ambulatory Visit: Payer: Self-pay

## 2019-09-23 DIAGNOSIS — Z915 Personal history of self-harm: Secondary | ICD-10-CM | POA: Diagnosis not present

## 2019-09-23 DIAGNOSIS — F329 Major depressive disorder, single episode, unspecified: Secondary | ICD-10-CM | POA: Diagnosis not present

## 2019-09-23 DIAGNOSIS — F331 Major depressive disorder, recurrent, moderate: Secondary | ICD-10-CM

## 2019-09-23 DIAGNOSIS — F419 Anxiety disorder, unspecified: Secondary | ICD-10-CM | POA: Diagnosis not present

## 2019-09-23 DIAGNOSIS — G35 Multiple sclerosis: Secondary | ICD-10-CM | POA: Diagnosis not present

## 2019-09-23 NOTE — H&P (Signed)
Behavioral Health Medical Screening Exam  Angel Maddox is an 64 y.o. female.who presented to [facility] today, as a walk-in, voluntarily, accompanied with her husband. Patient PMH include  depression and GAD. She reported she went to see her outpatient counselor, [name], today and while having a counseling session, she made the comment," I just don't want to live anymore." When asked if she felt suicidal she replied," I don't feel suicidal. Im not going to hurt myself I just feel like I don't want to live." Patient thoughts of not wanting to live anymore is chronic. She reported that she has been feeling that way after her son died 5 years ago. Per chart review, patients son died from an overdose on heroin. She added that the grandmother of her grandchildren has refused to let her see her grandchildren and she notes this as an additional stressor. She stated that she also has multiple sclerosis  (per review of chart for 20 years) and this seem to be a major stressor as well. She again, denied SI with current plan or intent. When asked if she had thoughts of wanting to harm others she stated," yeah, the grandmother of my grandkids. I think about cutting her tires." She deniesd plan or intent to cause significant harm. Reported at least two suicide attempts in the distant past with the most recent being in 2017. She was psychiatrically hospitalized here at [facility] in 2017 per chart review. Reported receiving medication management through [facility]. She denied other self-harming behaviors or psychosis. She stated that she did not want to come to the assessment today but was advised to by her counselor. She denied concerns with medications stating that she did not want medication changes. Declined  Information for additional outpatient psychiatric resources. Denied firearms being in the home or access. Denied substance abuse or use.   Patients husband stated that he had no concerns with patient leaving the hospital  today and he did not believe that she would try to harm herself. He stated," she will get like this every now and then then after a day or so, she will be back to herself." He confirmed that there are no guns in the home. We discussed a safety plan which is noted below.    Total Time spent with patient: 20 minutes  Psychiatric Specialty Exam: Physical Exam  Vitals reviewed. Constitutional: She is oriented to person, place, and time.  Neurological: She is alert and oriented to person, place, and time.    Review of Systems  Psychiatric/Behavioral: The patient is nervous/anxious.        Depression     There were no vitals taken for this visit.There is no height or weight on file to calculate BMI.  General Appearance: Fairly Groomed  Eye Contact:  Fair  Speech:  Clear and Coherent and Normal Rate  Volume:  Normal  Mood:  Anxious  Affect:  Congruent and Tearful  Thought Process:  Coherent, Linear and Descriptions of Associations: Intact  Orientation:  Full (Time, Place, and Person)  Thought Content:  Logical  Suicidal Thoughts:  No  Homicidal Thoughts:  No  Memory:  Immediate;   Fair Recent;   Fair  Judgement:  Fair  Insight:  Fair  Psychomotor Activity:  Normal  Concentration: Concentration: Fair and Attention Span: Fair  Recall:  [employer] of Knowledge:Fair  Language: Good  Akathisia:  Negative  Handed:  Right  AIMS (if indicated):     Assets:  Communication Skills Desire for  Improvement Resilience  Sleep:       Musculoskeletal: Strength & Muscle Tone: within normal limits Gait & Station: normal Patient leans: N/A  There were no vitals taken for this visit.  Recommendations:  Based on my evaluation the patient does not appear to have an emergency medical condition.   Patient denies SI with plan or intent along with psychosis. She endorses homicidal ideation but no plan or intent to harm anyone. She does however have passive death wishes and shared that prior to  her coming to [facility], she told her counselor," I just want to die" who then advised her to come to [facility] for an evaluation. She reports that she has been feeling this way since her son passed away from an overdose 5 years ago. Patient is currently active in outpatient therapy and her medications are managed by [facility]. She states that she has no need for additional outpatient psychiatric resources. I discussed with her and husband that there may be a benefit to have additional therapy session as she stated her sessions were twice per month. Patients husband stated that he would discuss this with her outpatient therapist.  Patient and husband were advised that If the patient's symptoms worsen or do not continue to improve or if the patient becomes actively suicidal or homicidal then it is recommended that the patient return to the closest hospital emergency room, return to [facility], or call 911 or the National Suicide Prevention Lifeline 1800-SUICIDE or 220-012-4437 for further evaluation and treatment. They denied that there were firearms in the home. Patients husband was educated about removing/locking any firearms, medications or dangerous products from the home.  Patient is leaving the hospital  in her husbands care following this psychiatric evaluation and recommendation as noted. Mordecai Maes, NP 09/23/2019, 3:01 PM

## 2019-09-23 NOTE — BH Assessment (Signed)
Assessment Note  Angel Maddox is an 64 y.o. female that presents this date with her spouse after meeting with her therapist Angel Maddox) earlier this date having passive S/I. Patient denies any active plan or intent. Patient contracts for safety and states she "would never hurt herself" she just at times "does not feel like going on." Patient denies any H/I or AVH. Patient reports continued declining health associated with her MS diagnoses which she states was over 20 years ago. Patient states she was diagnosed with MDD over 10 years ago and has been receiving OP Maddox from [facility] where she sees Angel Maddox who assists with medication management. Patient reports current medication compliance and feels her medications are working as indicated although reports she just has "bad days once in a while". Patient states that her son died in 08-26-2014 and has been having a difficult time coping with his death. Patient denies SI/HI but states that she has a lot of feelings of wanting to not live without him. Patient states she was referred here today after she met with her counselor Angel Maddox who she meets with twice a month. Patient's husband, Angel Maddox also participated in the assessment. Patient cried throughout the assessment and has a difficult time rendering her history. Patient is assisted by her husband who renders collateral. Patient reports one prior attempt at self harm in 2015/08/27 when she attempted to overdose. Patient is requesting to be discharged so she Maddox "go home to rest." Patient states she "just came here because her counselor asked her to." Patient states she has no intention of harming herself. Patient declined any resources stating "she likes her counselor she just needs to see her more." Patient denies any SA issues or history of abuse. Patient is oriented and is observed to be crying throughout the assessment. Patient does not appear to be responding to internal stimuli. Patient is  redirectable and memory appears to be intact with thoughts organized. Case was staffed with Angel Maddox who recommended patient be discharged and follow up with her current provider.    Diagnosis: F33.2 MDD recurrent without psychotic features, severe   Past Medical History:  Past Medical History:  Diagnosis Date  . Anxiety   . Arthritis    osteo  . Bipolar 1 disorder ([place])   . Cervicalgia   . Chronic kidney disease    kidney stone  . COPD (chronic obstructive pulmonary disease) ([place])    new diagnosis- now sees pulmonologist  . Depression   . Elevated liver enzymes    She says "normal now"  . GERD (gastroesophageal reflux disease)   . H/O hiatal hernia   . Headache   . Hip fracture ([place]) 27-Aug-2015   left  . History of kidney stones    4-5 yrs ago  . Hypertension   . Multiple sclerosis ([place])     Had for 15 years  . Multiple sclerosis ([place])    dx Aug 26, 1997  . Tremors of nervous system     Past Surgical History:  Procedure Laterality Date  . ANTERIOR CERVICAL DECOMP/DISCECTOMY FUSION N/A 07/28/2017   Procedure: CERVICAL FOUR-FIVE, CERVICAL FIVE-SIX ANTERIOR CERVICAL DECOMPRESSION/DISCECTOMY FUSION;  Surgeon: Angel Moore, MD;  Location: [place];  Service: Neurosurgery;  Laterality: N/A;  . COLONOSCOPY  2006/08/26   Angel Maddox  . EYE SURGERY     Angel Maddox  . HIP PINNING,CANNULATED Left 11/09/2015   Procedure: CANNULATED HIP PINNING;  Surgeon: Angel Can, MD;  Location: [facility];  Service: Orthopedics;  Laterality: Left;  . LIPOMA EXCISION    . TUBAL LIGATION      Family History:  Family History  Problem Relation Age of Onset  . Heart attack Mother   . Cancer Maternal Aunt   . Colon cancer Neg Hx   . Stomach cancer Neg Hx   . Esophageal cancer Neg Hx   . Rectal cancer Neg Hx     Social History:  reports that she quit smoking about 28 years ago. Her smoking use included cigarettes. She has a 3.75 pack-year smoking history. She has never used smokeless tobacco. She  reports that she does not drink alcohol or use drugs.  Additional Social History:  Alcohol / Drug Use Pain Medications: See MAR Prescriptions: See MAR Over the Counter: See MAR History of alcohol / drug use?: No history of alcohol / drug abuse  CIWA:   COWS:    Allergies:  Allergies  Allergen Reactions  . Hydrocodone Itching    Home Medications: (Not in a hospital admission)   OB/GYN Status:  No LMP recorded. Patient is postmenopausal.  General Assessment Data Location of Assessment: Angel Maddox TTS Assessment: In system Is this a Tele or Face-to-Face Assessment?: Face-to-Face Is this an Initial Assessment or a Re-assessment for this encounter?: Initial Assessment Patient Accompanied by:: Adult Permission Given to speak with another: No Language Other than English: No Living Arrangements: Other (Comment)(Lives with husband) What gender do you identify as?: Female Marital status: Married Pioneer name: Heckman Pregnancy Status: No Living Arrangements: Spouse/significant other Maddox pt return to current living arrangement?: Yes Admission Status: Voluntary Is patient capable of signing voluntary admission?: Yes Referral Source: Other(Counselor) Insurance type: Medicare  Medical Screening Exam ([place]) Medical Exam completed: Yes  Crisis Care Plan Living Arrangements: Spouse/significant other Legal Guardian: (NA) Name of Psychiatrist: Hurts Maddox([facility]) Name of Therapist: Rinaldo Maddox  Education Status Is patient currently in school?: No Is the patient employed, unemployed or receiving disability?: Unemployed  Risk to self with the past 6 months Suicidal Ideation: No Has patient been a risk to self within the past 6 months prior to admission? : No Suicidal Intent: No Has patient had any suicidal intent within the past 6 months prior to admission? : No Is patient at risk for suicide?: No Suicidal Plan?: No Has patient had any suicidal plan  within the past 6 months prior to admission? : No Access to Means: No What has been your use of drugs/alcohol within the last 12 months?: Denies Previous Attempts/Gestures: Yes How many times?: 1 Other Self Harm Risks: (NA) Triggers for Past Attempts: Unknown Intentional Self Injurious Behavior: None Family Suicide History: No Recent stressful life event(s): Other (Comment)(Health issues) Persecutory voices/beliefs?: No Depression: Yes Depression Symptoms: Feeling worthless/self pity Substance abuse history and/or treatment for substance abuse?: No Suicide prevention information given to non-admitted patients: Not applicable  Risk to Others within the past 6 months Homicidal Ideation: No Does patient have any lifetime risk of violence toward others beyond the six months prior to admission? : No Thoughts of Harm to Others: No Current Homicidal Intent: No Current Homicidal Plan: No Access to Homicidal Means: No Identified Victim: NA History of harm to others?: No Assessment of Violence: None Noted Violent Behavior Description: NA Does patient have access to weapons?: No Criminal Charges Pending?: No Does patient have a court date: No Is patient on probation?: No  Psychosis Hallucinations: None noted Delusions: None noted  Mental Status Report Appearance/Hygiene: Unremarkable  Eye Contact: Fair Motor Activity: Freedom of movement Speech: Logical/coherent Level of Consciousness: Crying Mood: Depressed Affect: Appropriate to circumstance Anxiety Level: Moderate Thought Processes: Coherent, Relevant Judgement: Partial Orientation: Person, Place, Time Obsessive Compulsive Thoughts/Behaviors: None  Cognitive Functioning Concentration: Normal Memory: Recent Intact, Remote Intact Is patient IDD: No Insight: Fair Impulse Control: Fair Appetite: Good Have you had any weight changes? : No Change Sleep: No Change Total Hours of Sleep: 8 Vegetative Symptoms:  None  ADLScreening  Hospital Assessment Maddox) Patient's cognitive ability adequate to safely complete daily activities?: Yes Patient able to express need for assistance with ADLs?: Yes Independently performs ADLs?: No  Prior Inpatient Therapy Prior Inpatient Therapy: Yes Prior Therapy Dates: 2017 Prior Therapy Facilty/Provider(s): [facility] Reason for Treatment: MH issues  Prior Outpatient Therapy Prior Outpatient Therapy: Yes Prior Therapy Dates: Ongoing Prior Therapy Facilty/Provider(s): [facility] Reason for Treatment: Med mang Does patient have an ACCT team?: No Does patient have Intensive In-House Maddox?  : No Does patient have [facility] Maddox? : No Does patient have P4CC Maddox?: No  ADL Screening (condition at time of admission) Patient's cognitive ability adequate to safely complete daily activities?: Yes Is the patient deaf or have difficulty hearing?: No Does the patient have difficulty seeing, even when wearing glasses/contacts?: No Does the patient have difficulty concentrating, remembering, or making decisions?: No Patient able to express need for assistance with ADLs?: Yes Does the patient have difficulty dressing or bathing?: Yes Independently performs ADLs?: No Communication: Independent Dressing (OT): Needs assistance Is this a change from baseline?: Pre-admission baseline Grooming: Independent Feeding: Independent Bathing: Needs assistance Is this a change from baseline?: Pre-admission baseline Toileting: Needs assistance Is this a change from baseline?: Pre-admission baseline In/Out Bed: Needs assistance Is this a change from baseline?: Pre-admission baseline Walks in Home: Needs assistance Is this a change from baseline?: Pre-admission baseline Does the patient have difficulty walking or climbing stairs?: Yes Weakness of Legs: Both Weakness of Arms/Hands: Both  Home Assistive Devices/Equipment Home Assistive Devices/Equipment: Wheelchair  Therapy  Consults (therapy consults require a physician order) PT Evaluation Needed: No OT Evalulation Needed: No SLP Evaluation Needed: No Abuse/Neglect Assessment (Assessment to be complete while patient is alone) Abuse/Neglect Assessment Maddox Be Completed: Yes Physical Abuse: Denies Verbal Abuse: Denies Sexual Abuse: Denies Exploitation of patient/patient's resources: Denies Self-Neglect: Denies Values / Beliefs Cultural Requests During Hospitalization: None Spiritual Requests During Hospitalization: None Consults Spiritual Care Consult Needed: No Transition of Care Team Consult Needed: No Advance Directives (For Healthcare) Does Patient Have a Medical Advance Directive?: No Would patient like information on creating a medical advance directive?: No - Patient declined          Disposition: Case was staffed with Angel Maddox who recommended patient be discharged and follow up with her current provider.    Disposition Initial Assessment Completed for this Encounter: Yes Disposition of Patient: Discharge  On Site Evaluation by:   Reviewed with Physician:    Mamie Nick 09/23/2019 3:12 PM

## 2019-09-23 NOTE — Progress Notes (Addendum)
[facility] Counselor/Therapist Progress Note  Patient ID: Angel Maddox, MRN: [id number],    Date: 09/23/2019  Time Spent: 12:00pm to 12:50pm  Treatment Type: Individual Therapy  Reported Symptoms:  Anxiety, depression  Mental Status Exam:  Appearance:   Casual     Behavior:  Appropriate and Sharing; became more agitated and making SI comments later in session  Motor:  some rigidty and difficulty moving due to her MS  Speech/Language:   Normal Rate; became much louder and agitated later in session around her statements of "wanting to die"  Affect:  Depressed  Mood:  anxious, depressed and irritable  Thought process:  goal directed; more mixed later in session  Thought content:    WNL; thoughts more mixed at session end  Sensory/Perceptual disturbances:    WNL  Orientation:  oriented to person, place, time/date, situation, day of week, month of year and year  Attention:  Good/Fair  Concentration:  Good  Memory:  forgetfulness and her PCP is aware  Fund of knowledge:   Good  Insight:    Good/Fair  Judgment:   Good/Fair  Impulse Control:  Fair/Poor   Risk Assessment: Danger to Self:  Not initially, but later made strong SI comments in session and was referred for evaluation to determine if inpatient care may be needed. Self-injurious Behavior: No Danger to Others: No Duty to Warn:no Physical Aggression / Violence:No  Access to Firearms a concern: Not for sure.  Gang Involvement:No   Subjective: Patient in today with depression, anxiety, and irritability.  Depression increased some since last appt. Struggles with daughter, husband, and her physical concerns of her MS.    Interventions: Cognitive Behavioral Therapy and Ego-Supportive  Diagnosis:   ICD-10-CM   1. Major depressive disorder, recurrent episode, moderate (Angel Maddox)  F33.1      Plan: Patient not signing tx plan updates on computer screen due to Angel Maddox.   Treatment Goals: Goals may remain on tx plan  as patient works on strategies to meet her goals. Progress will be documented each session in "Progress" section.  Long term goal: Develop the ability to recognize, accept, and cope with feelings of depressionand anxiety.  Short term goal: Learn and implement calming skills to reduce overall tension and moments of increased anxiety and/or depression.Patient will recognize her negative self-talk, interrupt it, and not let it prevent her from trying new strategies to help herself.  Strategy: Teach patient cognitive and somatic calming skills . Rehearse with patient how to apply these skills to her daily life. Review and reinforce success while providing corrective feedback toward consistent implementation.  PROGRESS: Patient in today feeling more depressed and needed to talk through feelings of "not really wanting to live."  She explained that she didn't really want to die, "but also didn't want to live."  Later stated she did "want to die", saying it very loudly.  Talked about this in more detail and at length with patient and tearfully she looked at me and said "I Want to Die."  After talking further , I shared with her that I wanted her to be evaluated for whether or not she needs inpatient care at this time.  Patient did not like this but I think she knew it needed to happen.  Husband joined [place] in talking for a while and he shared that she had told him she wanted to die also.  I called over to the Maddox where patients are evaluated so they knew to expect her  and husband.  Closed out our session supportively knowing that this is difficult for patient.  Husband is to let us know outcome from the evaluation.  Goal review and progress/challenges noted with patient.   Next appt within 2-3 weeks, depending on whether she has to be admitted at Angel Maddox.   Angel Ace, LCSW

## 2019-10-02 ENCOUNTER — Ambulatory Visit: Payer: Medicare PPO | Admitting: Family Medicine

## 2019-10-07 ENCOUNTER — Ambulatory Visit: Payer: Medicare PPO | Admitting: Physician Assistant

## 2019-10-09 ENCOUNTER — Ambulatory Visit
Admission: RE | Admit: 2019-10-09 | Discharge: 2019-10-09 | Disposition: A | Discharge status: Home / Self Care | Payer: Medicare PPO | Source: Ambulatory Visit | Attending: Physician Assistant | Admitting: Physician Assistant

## 2019-10-09 ENCOUNTER — Other Ambulatory Visit: Payer: Self-pay

## 2019-10-09 DIAGNOSIS — G35 Multiple sclerosis: Secondary | ICD-10-CM

## 2019-10-09 MED ORDER — GADOBENATE DIMEGLUMINE 529 MG/ML IV SOLN
10.0000 mL | Freq: Once | INTRAVENOUS | Status: AC | PRN
  Administered 2019-10-09: 10 mL via INTRAVENOUS

## 2019-10-14 ENCOUNTER — Telehealth: Payer: Self-pay | Admitting: Physician Assistant

## 2019-10-14 ENCOUNTER — Other Ambulatory Visit (HOSPITAL_COMMUNITY): Payer: Self-pay | Admitting: General Practice

## 2019-10-14 ENCOUNTER — Ambulatory Visit: Payer: Medicare PPO | Admitting: Psychiatry

## 2019-10-14 NOTE — Telephone Encounter (Signed)
  Incoming Patient Call  10/14/2019  What symptoms do you have? Legs and ankles swelling, fell off potty chair and ribs are hurting, also has fallen 9 times in the past few weeks  How long have you been sick? A few weeks  Have you been seen for this problem? no  If your provider decides to give you a prescription, which pharmacy would you like for it to be sent to? [facility]    Patient informed that this information will be sent to the clinical staff for review and that they should receive a follow up call.

## 2019-10-14 NOTE — Telephone Encounter (Signed)
Patient is seeing another PCP- set up new patient appointment. Patient aware she can not see [place] both and she states she wants to come back to our office.

## 2019-10-18 ENCOUNTER — Other Ambulatory Visit: Payer: Self-pay

## 2019-10-18 ENCOUNTER — Encounter (HOSPITAL_COMMUNITY): Payer: Self-pay

## 2019-10-18 ENCOUNTER — Ambulatory Visit (HOSPITAL_COMMUNITY)
Admission: RE | Admit: 2019-10-18 | Discharge: 2019-10-18 | Disposition: A | Discharge status: Home / Self Care | Payer: Medicare PPO | Source: Ambulatory Visit | Attending: Psychiatry | Admitting: Psychiatry

## 2019-10-18 DIAGNOSIS — G35 Multiple sclerosis: Secondary | ICD-10-CM | POA: Diagnosis not present

## 2019-10-18 MED ORDER — SODIUM CHLORIDE 0.9 % IV SOLN
1000.0000 mg | INTRAVENOUS | Status: DC
  Administered 2019-10-18: 1000 mg via INTRAVENOUS
  Filled 2019-10-18: qty 100

## 2019-10-18 MED ORDER — ACETAMINOPHEN 500 MG PO TABS
1000.0000 mg | ORAL_TABLET | ORAL | Status: DC
  Administered 2019-10-18: 1000 mg via ORAL
  Filled 2019-10-18: qty 2

## 2019-10-18 MED ORDER — DIPHENHYDRAMINE HCL 50 MG/ML IJ SOLN
25.0000 mg | INTRAMUSCULAR | Status: DC
  Administered 2019-10-18: 25 mg via INTRAVENOUS
  Filled 2019-10-18: qty 1

## 2019-10-18 MED ORDER — METHYLPREDNISOLONE SODIUM SUCC 125 MG IJ SOLR
125.0000 mg | INTRAMUSCULAR | Status: DC
  Administered 2019-10-18: 125 mg via INTRAVENOUS
  Filled 2019-10-18: qty 2

## 2019-10-18 MED ORDER — SODIUM CHLORIDE 0.9 % IV SOLN: INTRAVENOUS | Status: DC

## 2019-10-28 ENCOUNTER — Ambulatory Visit: Payer: Medicare Other | Admitting: Internal Medicine

## 2019-10-29 ENCOUNTER — Ambulatory Visit: Payer: Medicare PPO | Admitting: Psychiatry

## 2019-10-31 ENCOUNTER — Ambulatory Visit: Payer: Medicare PPO | Admitting: Family Medicine

## 2019-11-01 ENCOUNTER — Encounter (HOSPITAL_COMMUNITY): Payer: Self-pay

## 2019-11-01 ENCOUNTER — Other Ambulatory Visit: Payer: Self-pay

## 2019-11-01 ENCOUNTER — Encounter (HOSPITAL_COMMUNITY)
Admission: RE | Admit: 2019-11-01 | Discharge: 2019-11-01 | Disposition: A | Discharge status: Home / Self Care | Payer: Medicare PPO | Source: Ambulatory Visit | Attending: Psychiatry | Admitting: Psychiatry

## 2019-11-01 DIAGNOSIS — G35 Multiple sclerosis: Secondary | ICD-10-CM | POA: Insufficient documentation

## 2019-11-01 MED ORDER — METHYLPREDNISOLONE SODIUM SUCC 125 MG IJ SOLR
INTRAMUSCULAR | Status: AC
  Administered 2019-11-01: 09:00:00 125 mg via INTRAVENOUS
  Filled 2019-11-01: qty 2

## 2019-11-01 MED ORDER — ACETAMINOPHEN 500 MG PO TABS: 1000.0000 mg | ORAL_TABLET | ORAL | Status: DC

## 2019-11-01 MED ORDER — SODIUM CHLORIDE 0.9 % IV SOLN
1000.0000 mg | INTRAVENOUS | Status: DC
  Administered 2019-11-01: 1000 mg via INTRAVENOUS
  Filled 2019-11-01: qty 100

## 2019-11-01 MED ORDER — METHYLPREDNISOLONE SODIUM SUCC 125 MG IJ SOLR: 125.0000 mg | INTRAMUSCULAR | Status: DC

## 2019-11-01 MED ORDER — DIPHENHYDRAMINE HCL 50 MG/ML IJ SOLN: 25.0000 mg | INTRAMUSCULAR | Status: DC

## 2019-11-01 MED ORDER — ACETAMINOPHEN 500 MG PO TABS
ORAL_TABLET | ORAL | Status: AC
  Administered 2019-11-01: 1000 mg via ORAL
  Filled 2019-11-01: qty 2

## 2019-11-01 MED ORDER — SODIUM CHLORIDE 0.9 % IV SOLN: INTRAVENOUS | Status: DC

## 2019-11-01 MED ORDER — DIPHENHYDRAMINE HCL 50 MG/ML IJ SOLN
INTRAMUSCULAR | Status: AC
  Administered 2019-11-01: 09:00:00 25 mg via INTRAVENOUS
  Filled 2019-11-01: qty 1

## 2019-11-08 IMAGING — DX CHEST - 2 VIEW
2 series · 2 of 2 positions shown · non-contrast
Comparison: 07/24/2017

CLINICAL DATA: Dyspnea on exertion, RIGHT upper quadrant pain,
history COPD, hypertension, multiple sclerosis, hiatal hernia, GERD

EXAM:
CHEST - 2 VIEW

[chest pa]
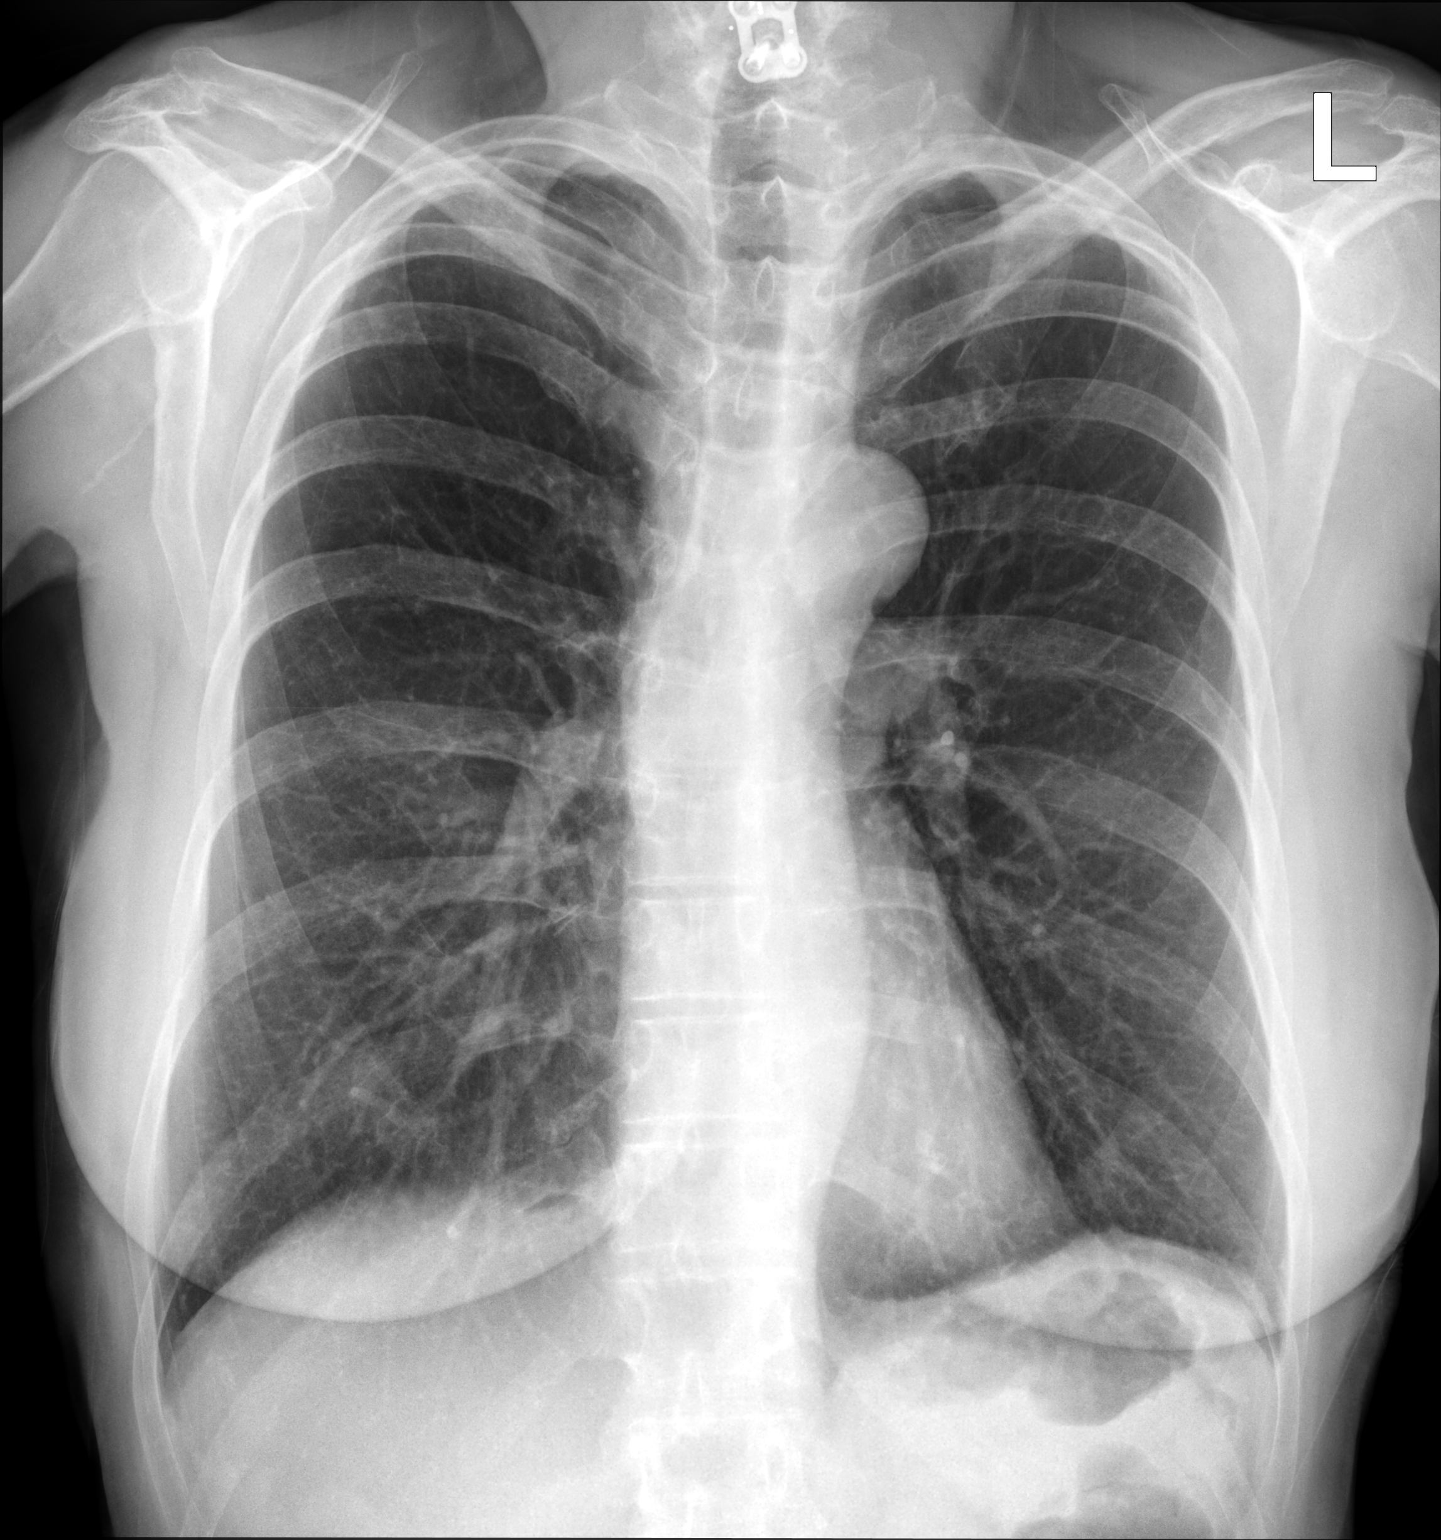

[chest lat]
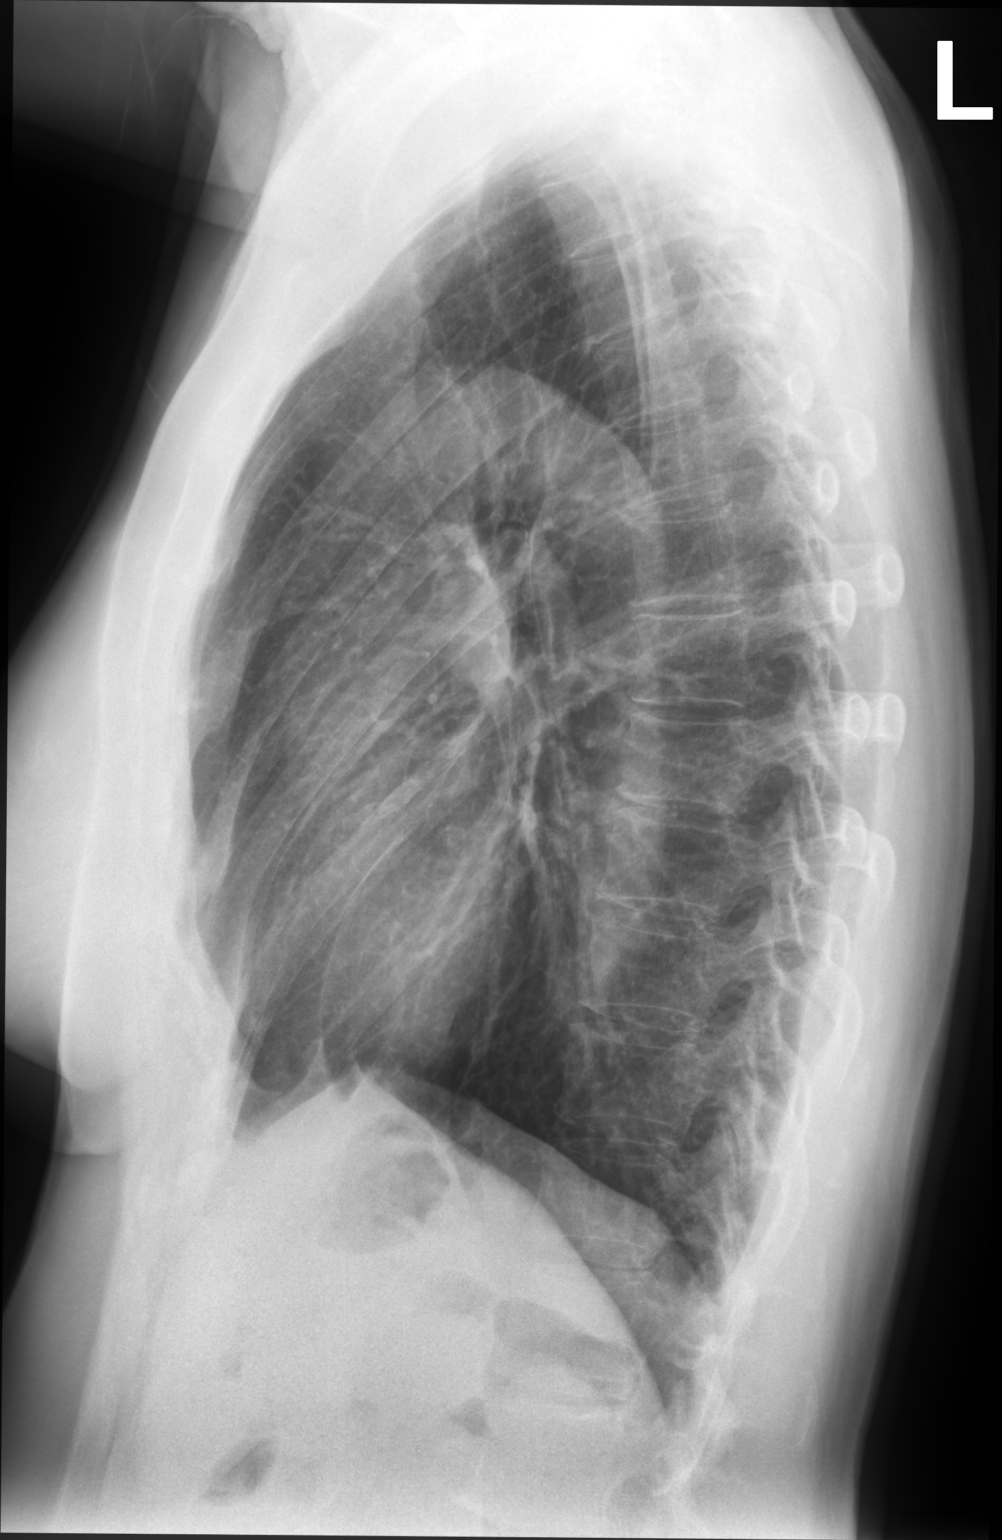

[2 of 2 positions shown; findings below may reference images not displayed]

FINDINGS: Normal heart size, mediastinal contours, and pulmonary vascularity.

Emphysematous changes without infiltrate, pleural effusion or
pneumothorax.

Bones appear demineralized.

Interval cervical spine fusion procedure.
IMPRESSION: Emphysematous changes without infiltrate.

## 2019-11-15 ENCOUNTER — Other Ambulatory Visit: Payer: Self-pay | Admitting: Physician Assistant

## 2019-11-15 NOTE — Telephone Encounter (Signed)
Apt 06/2019 due back 2 months, nothing scheduled

## 2019-11-20 ENCOUNTER — Ambulatory Visit: Payer: Medicare PPO | Admitting: Family Medicine

## 2019-11-20 ENCOUNTER — Encounter: Payer: Medicare PPO | Admitting: Internal Medicine

## 2019-11-26 ENCOUNTER — Other Ambulatory Visit (HOSPITAL_COMMUNITY): Payer: Self-pay

## 2019-11-26 NOTE — Progress Notes (Signed)
1000,11/26/19: spoke with patient regarding location change for her next Rituxan infusions.  Explained to patient her appointment date and times(August 24 & Sept.7) would remain the same but the location would now be at the new [place].  Gave her the new phone number([phone]) for the new location.  Patient voiced understanding to these changes.

## 2019-11-28 ENCOUNTER — Other Ambulatory Visit: Payer: Self-pay | Admitting: Psychiatry

## 2019-11-28 DIAGNOSIS — M545 Low back pain, unspecified: Secondary | ICD-10-CM

## 2019-12-01 ENCOUNTER — Other Ambulatory Visit: Payer: Self-pay | Admitting: Physician Assistant

## 2019-12-02 NOTE — Telephone Encounter (Signed)
Last apt 07/25/2019, was due back 2 months

## 2019-12-04 ENCOUNTER — Telehealth: Payer: Self-pay | Admitting: Gastroenterology

## 2019-12-04 NOTE — Telephone Encounter (Signed)
Patient notified She will call back on Friday if her symptoms don't improve

## 2019-12-04 NOTE — Telephone Encounter (Signed)
Left message for patient to call back  

## 2019-12-04 NOTE — Telephone Encounter (Signed)
Patient reports that she has had diarrhea for the last two weeks.  She has had a flare up of her MS and has had changes made to all her meds and most have been stopped. The diarrhea started after stopping her medications. She has had 12 falls in the last few weeks.  She has tried OTC products with little relief.  No recent antibiotics, sick contacts, or travel. .  Dr. Havery Moros, I scheduled her to see Ellouise Newer, PA next Thurs.  Any orders until OV?

## 2019-12-04 NOTE — Telephone Encounter (Signed)
Thanks [employer]. Unclear if med changes are related to this. She should try some immodium if she hasn't done that yet. If fever or persistent despite immodium could check GI pathogen panel if this is acute and agree with follow up in the office. Thanks

## 2019-12-05 ENCOUNTER — Other Ambulatory Visit: Payer: Self-pay | Admitting: Internal Medicine

## 2019-12-05 NOTE — Telephone Encounter (Signed)
Refill Request  amLODipine (NORVASC) 10 MG tablet   atorvastatin (LIPITOR) 40 MG tablet   [id number] [place], [place] [facility] [place]

## 2019-12-06 NOTE — Telephone Encounter (Signed)
Patient reports that the nausea has continued.  She has not actually had any diarrhea since our last conversation.  She has not been taking her carafate or omeprazole.  She will resume the omeprazole qd, zofran q 6 hours, and her carafate.ac meals

## 2019-12-06 NOTE — Telephone Encounter (Signed)
Pt states she is completely out of the amLODipine (NORVASC) 10 MG tablet and atorvastatin (LIPITOR) 40 MG tablet, please call pt back.

## 2019-12-06 NOTE — Telephone Encounter (Signed)
Patient is calling to advise that she has not improved. Woke up at 5 am with diarrhea and nausea

## 2019-12-08 ENCOUNTER — Other Ambulatory Visit: Payer: Self-pay | Admitting: Internal Medicine

## 2019-12-08 DIAGNOSIS — I10 Essential (primary) hypertension: Secondary | ICD-10-CM

## 2019-12-08 MED ORDER — AMLODIPINE BESYLATE 10 MG PO TABS: 10.0000 mg | ORAL_TABLET | Freq: Every day | ORAL | 1 refills | Status: AC

## 2019-12-08 MED ORDER — ATORVASTATIN CALCIUM 40 MG PO TABS: 40.0000 mg | ORAL_TABLET | Freq: Every day | ORAL | 1 refills | Status: DC

## 2019-12-12 ENCOUNTER — Ambulatory Visit: Payer: Medicare PPO | Admitting: Physician Assistant

## 2019-12-12 ENCOUNTER — Encounter: Payer: Self-pay | Admitting: Physician Assistant

## 2019-12-12 VITALS — BP 122/84 | Ht 67.0 in | Wt 137.0 lb

## 2019-12-12 DIAGNOSIS — R110 Nausea: Secondary | ICD-10-CM

## 2019-12-12 DIAGNOSIS — R1013 Epigastric pain: Secondary | ICD-10-CM | POA: Diagnosis not present

## 2019-12-12 DIAGNOSIS — R194 Change in bowel habit: Secondary | ICD-10-CM

## 2019-12-12 NOTE — Patient Instructions (Signed)
If you are age 64 or older, your body mass index should be between 23-30. Your Body mass index is 21.46 kg/m. If this is out of the aforementioned range listed, please consider follow up with your Primary Care Provider.  If you are age 66 or younger, your body mass index should be between 19-25. Your Body mass index is 21.46 kg/m. If this is out of the aformentioned range listed, please consider follow up with your Primary Care Provider.   Start Fiber supplement citracel,benefiber or metamucil every day. Increase water intake 6-8 (8 ounce) glasses of water. Make sure that you are taking omeprzole 40 mg daily. Take before breakfast or after carafate ( pink liquid)  Due to recent changes in healthcare laws, you may see the results of your imaging and laboratory studies on MyChart before your provider has had a chance to review them.  We understand that in some cases there may be results that are confusing or concerning to you. Not all laboratory results come back in the same time frame and the provider may be waiting for multiple results in order to interpret others.  Please give [place] 48 hours in order for your provider to thoroughly review all the results before contacting the office for clarification of your results.   Return to the clinic as needed.

## 2019-12-12 NOTE — Progress Notes (Signed)
Agree with assessment and plan as outlined.  

## 2019-12-12 NOTE — Progress Notes (Signed)
Chief Complaint: Diarrhea  HPI:    Angel Maddox is a 64 year old Caucasian female with a past medical history as listed below including CKD, COPD, GERD, multiple sclerosis and others, known to Dr. Havery Moros, who presents clinic today with a complaint of diarrhea.    04/17/2017 1 polyp, tortuous colon and internal hemorrhoids.  Pathology showed tubular adenoma repeat recommended in 5 years.    01/23/2019 EGD with a 1 cm hiatal hernia, normal esophagus otherwise, dilation performed to 17 mm, nonbleeding gastric ulcer, gastritis and otherwise normal.    Today, the patient presents to clinic and tells me that she had diarrhea which was very bad for about 2 weeks, but then right about when we told her to use the Imodium she stopped having diarrhea and had regular stools for 2 days and then became somewhat constipated and restarted her MiraLAX.  Tells me that as long as she uses MiraLAX occasionally when she gets constipated it works, but she does seem to radiate back-and-forth which is chronic for her.    Most concerning for the patient today is that she continues with a lot of gas and some nausea.  Tells me that when she gets up in the morning she takes her Carafate and has to wait for her stomach to "settle", before drinking any coffee or anything else.  Tells me she has had trouble with a lot of nasal drainage and wonders if this is contributing to her "uneasiness".    Also discusses that her MS medications have been changed around recently and is uncertain if any of this is related to that.    (Of note is difficult to get history from the patient and she is unsure exactly what medications she is taking and is fuzzy on all the details of her symptoms)    Denies fever, chills, weight loss, blood in her stool or symptoms that awaken her from sleep.        Past Medical History:  Diagnosis Date  . Anxiety   . Arthritis    osteo  . Bipolar 1 disorder ([place])   . Cervicalgia   . Chronic kidney disease      kidney stone  . COPD (chronic obstructive pulmonary disease) ([place])    new diagnosis- now sees pulmonologist  . Depression   . Elevated liver enzymes    She says "normal now"  . GERD (gastroesophageal reflux disease)   . H/O hiatal hernia   . Headache   . Hip fracture ([place]) 2017   left  . History of kidney stones    4-5 yrs ago  . Hypertension   . Multiple sclerosis ([place])     Had for 15 years  . Multiple sclerosis ([place])    dx 1999  . Tremors of nervous system     Past Surgical History:  Procedure Laterality Date  . ANTERIOR CERVICAL DECOMP/DISCECTOMY FUSION N/A 07/28/2017   Procedure: CERVICAL FOUR-FIVE, CERVICAL FIVE-SIX ANTERIOR CERVICAL DECOMPRESSION/DISCECTOMY FUSION;  Surgeon: Eustace Moore, MD;  Location: [facility];  Service: Neurosurgery;  Laterality: N/A;  . COLONOSCOPY  2008   Dr.Kaplan  . EYE SURGERY     [facility]  . HIP PINNING,CANNULATED Left 11/09/2015   Procedure: CANNULATED HIP PINNING;  Surgeon: Rod Can, MD;  Location: [facility];  Service: Orthopedics;  Laterality: Left;  . LIPOMA EXCISION    . TUBAL LIGATION      Current Outpatient Medications  Medication Sig Dispense Refill  . amLODipine (NORVASC) 10 MG tablet  Take 1 tablet (10 mg total) by mouth daily. 90 tablet 1  . aspirin EC 81 MG EC tablet Take 1 tablet (81 mg total) by mouth daily. (Patient not taking: Reported on 07/25/2019) 90 tablet 0  . atorvastatin (LIPITOR) 40 MG tablet Take 1 tablet (40 mg total) by mouth daily at 6 PM. 90 tablet 1  . baclofen (LIORESAL) 10 MG tablet Take 10 mg by mouth 3 (three) times daily.    . benzonatate (TESSALON) 200 MG capsule Take 1 capsule (200 mg total) by mouth 2 (two) times daily as needed for cough. 20 capsule 0  . buPROPion (WELLBUTRIN SR) 200 MG 12 hr tablet Take 1 tablet (200 mg total) by mouth 2 (two) times daily. 60 tablet 5  . citalopram (CELEXA) 40 MG tablet TAKE 1 TABLET BY MOUTH EVERY DAY 30 tablet 0  . clonazePAM (KLONOPIN) 1 MG tablet  Take 1 mg by mouth at bedtime.     Marland Kitchen dalfampridine 10 MG TB12     . fluticasone furoate-vilanterol (BREO ELLIPTA) 100-25 MCG/INH AEPB Inhale 1 puff into the lungs daily. 1 each 0  . gabapentin (NEURONTIN) 300 MG capsule Take 2 capsules (600 mg total) by mouth 3 (three) times daily. (Patient taking differently: Take 900 mg by mouth 3 (three) times daily. ) 180 capsule 0  . ipratropium-albuterol (DUONEB) 0.5-2.5 (3) MG/3ML SOLN USE 1 VIAL IN NEBULIZER EVERY 6 HOURS - And as needed 120 mL 11  . losartan (COZAAR) 25 MG tablet TAKE 1 TABLET BY MOUTH EVERY DAY 90 tablet 1  . mirtazapine (REMERON) 15 MG tablet TAKE 0.5-1 TABLETS (7.5-15 MG TOTAL) BY MOUTH AT BEDTIME AS NEEDED. 30 tablet 0  . Nebulizers (COMPRESSOR/NEBULIZER) MISC Use as directed 1 each 0  . omeprazole (PRILOSEC) 40 MG capsule TAKE 1 CAPSULE BY MOUTH EVERY DAY (Patient not taking: Reported on 07/25/2019) 90 capsule 1  . ondansetron (ZOFRAN-ODT) 4 MG disintegrating tablet TAKE 1 TABLET BY MOUTH EVERY 8 HOURS AS NEEDED FOR NAUSEA AND VOMITING (Patient not taking: Reported on 07/25/2019) 30 tablet 2  . Oxycodone HCl 10 MG TABS Take 1 tablet (10 mg total) by mouth every 6 (six) hours as needed ((score 7 to 10)). (Patient not taking: Reported on 07/25/2019) 120 tablet 0  . primidone (MYSOLINE) 250 MG tablet Take 250 mg by mouth 3 (three) times daily.     Marland Kitchen PROAIR HFA 108 (90 Base) MCG/ACT inhaler INHALE 2 PUFFS INTO THE LUNGS EVERY 4 (FOUR) HOURS AS NEEDED FOR WHEEZING OR SHORTNESS OF BREATH. 8.5 Inhaler 1  . pseudoephedrine-acetaminophen (TYLENOL SINUS) 30-500 MG TABS tablet Take 1 tablet by mouth daily as needed (pain).    . riTUXimab (RITUXAN) 100 MG/10ML injection Inject into the vein.    Marland Kitchen sucralfate (CARAFATE) 1 GM/10ML suspension TAKE 10 MLS (1 G TOTAL) BY MOUTH EVERY 8 (EIGHT) HOURS AS NEEDED. (Patient not taking: Reported on 07/25/2019) 420 mL 3  . tizanidine (ZANAFLEX) 2 MG capsule Take 2 mg by mouth every 8 (eight) hours as needed for  muscle spasms.     Marland Kitchen tiZANidine (ZANAFLEX) 4 MG capsule     . valACYclovir (VALTREX) 1000 MG tablet TAKE 1 TABLET 2 TIMES DAILY. THEN STAY ON ONE TABLET DAILY AS PREVENTION (Patient not taking: Reported on 07/25/2019) 40 tablet 5   No current facility-administered medications for this visit.    Allergies as of 12/12/2019 - Review Complete 11/01/2019  Allergen Reaction Noted  . Hydrocodone Itching 08/15/2018    Family History  Problem Relation Age of Onset  . Heart attack Mother   . Cancer Maternal Aunt   . Colon cancer Neg Hx   . Stomach cancer Neg Hx   . Esophageal cancer Neg Hx   . Rectal cancer Neg Hx     Social History   Socioeconomic History  . Marital status: Married    Spouse name: Not on file  . Number of children: Not on file  . Years of education: Not on file  . Highest education level: Not on file  Occupational History  . Not on file  Tobacco Use  . Smoking status: Former Smoker    Packs/day: 0.25    Years: 15.00    Pack years: 3.75    Types: Cigarettes    Quit date: 01/25/1991    Years since quitting: 28.8  . Smokeless tobacco: Never Used  . Tobacco comment: NO SMOKING at All  Vaping Use  . Vaping Use: Never used  Substance and Sexual Activity  . Alcohol use: No  . Drug use: No  . Sexual activity: Yes  Other Topics Concern  . Not on file  Social History Narrative  . Not on file   Social Determinants of Health   Financial Resource Strain:   . Difficulty of Paying Living Expenses:   Food Insecurity:   . Worried About Charity fundraiser in the Last Year:   . Arboriculturist in the Last Year:   Transportation Needs:   . Film/video editor (Medical):   Marland Kitchen Lack of Transportation (Non-Medical):   Physical Activity:   . Days of Exercise per Week:   . Minutes of Exercise per Session:   Stress:   . Feeling of Stress :   Social Connections:   . Frequency of Communication with Friends and Family:   . Frequency of Social Gatherings with Friends  and Family:   . Attends Religious Services:   . Active Member of Clubs or Organizations:   . Attends Archivist Meetings:   Marland Kitchen Marital Status:   Intimate Partner Violence:   . Fear of Current or Ex-Partner:   . Emotionally Abused:   Marland Kitchen Physically Abused:   . Sexually Abused:     Review of Systems:    Constitutional: No weight loss, fever or chills Cardiovascular: No chest pain Respiratory: No SOB  Gastrointestinal: See HPI and otherwise negative   Physical Exam:  Vital signs: BP 122/84 (BP Location: Right Arm, Patient Position: Sitting, Cuff Size: Normal)   Ht 5\' 7"  (1.702 m)   Wt 137 lb (62.1 kg)   BMI 21.46 kg/m   Constitutional:   Pleasant Caucasian female appears to be in NAD, Well developed, Well nourished, alert and cooperative Respiratory: Respirations even and unlabored. Lungs clear to auscultation bilaterally.   No wheezes, crackles, or rhonchi.  Cardiovascular: Normal S1, S2. No MRG. Regular rate and rhythm. No peripheral edema, cyanosis or pallor.  Gastrointestinal:  Soft, nondistended, mild epigastric ttp. No rebound or guarding. Normal bowel sounds. No appreciable masses or hepatomegaly. Rectal:  Not performed.  Psychiatric:  Demonstrates good judgement and reason without abnormal affect or behaviors.  MOST RECENT LABS AND IMAGING: CBC    Component Value Date/Time   WBC 4.7 12/28/2018 0402   RBC 3.96 12/28/2018 0402   HGB 13.0 12/28/2018 0402   HGB 14.2 10/16/2018 1225   HCT 38.8 12/28/2018 0402   HCT 41.1 10/16/2018 1225   PLT 308 12/28/2018 0402   PLT 287 10/16/2018  1225   MCV 98.0 12/28/2018 0402   MCV 95 10/16/2018 1225   MCH 32.8 12/28/2018 0402   MCHC 33.5 12/28/2018 0402   RDW 13.0 12/28/2018 0402   RDW 13.9 10/16/2018 1225   LYMPHSABS 1.2 12/27/2018 1456   LYMPHSABS 2.1 10/16/2018 1225   MONOABS 0.5 12/27/2018 1456   EOSABS 0.3 12/27/2018 1456   EOSABS 0.1 10/16/2018 1225   BASOSABS 0.1 12/27/2018 1456   BASOSABS 0.1 10/16/2018 1225     CMP     Component Value Date/Time   NA 138 07/22/2019 1505   K 4.5 07/22/2019 1505   CL 98 07/22/2019 1505   CO2 25 07/22/2019 1505   GLUCOSE 69 07/22/2019 1505   GLUCOSE 61 (L) 12/28/2018 0402   BUN 9 07/22/2019 1505   CREATININE 1.00 07/22/2019 1505   CALCIUM 8.8 07/22/2019 1505   PROT 7.0 02/04/2019 1426   PROT 7.0 10/16/2018 1225   ALBUMIN 4.4 02/04/2019 1426   ALBUMIN 4.6 10/16/2018 1225   AST 30 02/04/2019 1426   ALT 19 02/04/2019 1426   ALKPHOS 153 (H) 02/04/2019 1426   BILITOT 0.2 02/04/2019 1426   BILITOT <0.2 10/16/2018 1225   GFRNONAA 60 07/22/2019 1505   GFRAA 69 07/22/2019 1505    Assessment: 1.  Change in bowel habits: Bowel habits are radiating back-and-forth from constipation to diarrhea, she controls this with MiraLAX as needed; consider relation to medications versus more likely IBS with her history of anxiety and depression 2.  Nausea: After patient had diarrhea for a couple of weeks, was off of her GI medicines including Omeprazole and Carafate and still does not think she is taking her Omeprazole at this time; most likely gastritis  Plan: 1.  Discussed with patient that I recommend she start a fiber supplement such as Metamucil, Citrucel or Benefiber.  She should take this once daily. 2.  Recommend the patient increase water intake to the 6-8 eight ounce glasses of water per day.  Patient tells me she struggles with water intake. 3.  Would recommend the patient make sure she is using her Omeprazole 40 mg daily, 30 minutes before breakfast and an hour after her Carafate.  If she is taking this medication then could try increasing to twice daily. 4.  Patient to follow in clinic with Dr. Havery Moros as needed in the future.  Ellouise Newer, PA-C [facility] 12/12/2019, 1:30 PM  Cc: [place], [place] D, DO

## 2019-12-14 ENCOUNTER — Other Ambulatory Visit: Payer: Self-pay | Admitting: Physician Assistant

## 2019-12-16 ENCOUNTER — Telehealth: Payer: Self-pay | Admitting: Physician Assistant

## 2019-12-16 ENCOUNTER — Encounter: Payer: Self-pay | Admitting: Physician Assistant

## 2019-12-16 ENCOUNTER — Telehealth (INDEPENDENT_AMBULATORY_CARE_PROVIDER_SITE_OTHER): Payer: Medicare PPO | Admitting: Physician Assistant

## 2019-12-16 DIAGNOSIS — G35 Multiple sclerosis: Secondary | ICD-10-CM

## 2019-12-16 DIAGNOSIS — G4700 Insomnia, unspecified: Secondary | ICD-10-CM | POA: Diagnosis not present

## 2019-12-16 DIAGNOSIS — F411 Generalized anxiety disorder: Secondary | ICD-10-CM

## 2019-12-16 DIAGNOSIS — F331 Major depressive disorder, recurrent, moderate: Secondary | ICD-10-CM | POA: Diagnosis not present

## 2019-12-16 MED ORDER — BUPROPION HCL ER (SR) 200 MG PO TB12: 200.0000 mg | ORAL_TABLET | Freq: Two times a day (BID) | ORAL | 5 refills | Status: DC

## 2019-12-16 MED ORDER — MIRTAZAPINE 15 MG PO TABS: 7.5000 mg | ORAL_TABLET | Freq: Every evening | ORAL | 5 refills | Status: DC | PRN

## 2019-12-16 MED ORDER — CITALOPRAM HYDROBROMIDE 40 MG PO TABS: 40.0000 mg | ORAL_TABLET | Freq: Every day | ORAL | 5 refills | Status: DC

## 2019-12-16 NOTE — Telephone Encounter (Signed)
Has apt today, virtual

## 2019-12-16 NOTE — Progress Notes (Signed)
[facility]  Patient ID: Angel Maddox,  MRN: [id number]  PCP: Delice Bison, DO  Date of Evaluation: 12/16/2019 Time spent:20 minutes  Chief Complaint:  Chief Complaint    Medication Refill     Virtual Visit via Telephone or Video Note  I connected with patient by a video enabled telemedicine application or telephone, with their informed consent, and verified patient privacy and that I am speaking with the correct person using two identifiers.  I am private, in my office and the patient is in [place] on vacation.  I discussed the limitations, risks, security and privacy concerns of performing an evaluation and management service by telephone and the availability of in person appointments. I also discussed with the patient that there may be a patient responsible charge related to this service. The patient expressed understanding and agreed to proceed.   I discussed the assessment and treatment plan with the patient. The patient was provided an opportunity to ask questions and all were answered. The patient agreed with the plan and demonstrated an understanding of the instructions.   The patient was advised to call back or seek an in-person evaluation if the symptoms worsen or if the condition fails to improve as anticipated.  I provided 20 minutes of non-face-to-face time during this encounter.   HISTORY/CURRENT STATUS: HPI For routine med check.   Doing well with mental health, even though she's having more physical health issues. She's on vacation now, at the beach and is enjoying that. Due to the MS, she has low energy and can't get around easily. Sleeps well most of the time. Not crying easily. No SI/HI.  States she's only been using the Wellbutrin SR once because that is what the directions were on the bottle.  She is doing fine with that dose.  Patient denies increased energy with decreased need for sleep, no increased talkativeness, no racing  thoughts, no impulsivity or risky behaviors, no increased spending, no increased libido, no grandiosity, no increased irritability or anger, and no hallucinations.  Anxiety is controlled.  States she is taking the Klonopin 1 mg, one half usually at night sometimes during the day another half.  Individual Medical History/ Review of Systems: Changes? :Yes MS has been worse with spasticity and gait, causing several falls, no serious injuries. Neuro has changed some of her meds around and she's doing better. Has an MRI of lumbar spine set up soon.   Past medications for mental health diagnoses include: Depakote, Lamictal, Risperdal, Celexa, Klonopin, trazodone, Wellbutrin, Vistaril, primidone, gabapentin, Prozac, Zyprexa, Ambien, Lunesta, Effexor  Allergies: Hydrocodone  Current Medications:  Current Outpatient Medications:  .  amLODipine (NORVASC) 10 MG tablet, Take 1 tablet (10 mg total) by mouth daily., Disp: 90 tablet, Rfl: 1 .  baclofen (LIORESAL) 10 MG tablet, Take 10 mg by mouth 3 (three) times daily., Disp: , Rfl:  .  buPROPion (WELLBUTRIN SR) 200 MG 12 hr tablet, Take 1 tablet (200 mg total) by mouth 2 (two) times daily., Disp: 60 tablet, Rfl: 5 .  citalopram (CELEXA) 40 MG tablet, Take 1 tablet (40 mg total) by mouth daily., Disp: 30 tablet, Rfl: 5 .  clonazePAM (KLONOPIN) 1 MG tablet, Take 0.5 mg by mouth 2 (two) times daily as needed. , Disp: , Rfl:  .  dalfampridine 10 MG TB12, , Disp: , Rfl:  .  fluticasone furoate-vilanterol (BREO ELLIPTA) 100-25 MCG/INH AEPB, Inhale 1 puff into the lungs daily., Disp: 1 each, Rfl: 0 .  losartan (  COZAAR) 25 MG tablet, TAKE 1 TABLET BY MOUTH EVERY DAY, Disp: 90 tablet, Rfl: 1 .  mirtazapine (REMERON) 15 MG tablet, Take 0.5-1 tablets (7.5-15 mg total) by mouth at bedtime as needed., Disp: 30 tablet, Rfl: 5 .  Nebulizers (COMPRESSOR/NEBULIZER) MISC, Use as directed, Disp: 1 each, Rfl: 0 .  ondansetron (ZOFRAN-ODT) 4 MG disintegrating tablet, TAKE 1  TABLET BY MOUTH EVERY 8 HOURS AS NEEDED FOR NAUSEA AND VOMITING, Disp: 30 tablet, Rfl: 2 .  Oxycodone HCl 10 MG TABS, Take 1 tablet (10 mg total) by mouth every 6 (six) hours as needed ((score 7 to 10))., Disp: 120 tablet, Rfl: 0 .  primidone (MYSOLINE) 250 MG tablet, Take 250 mg by mouth 3 (three) times daily. , Disp: , Rfl:  .  pseudoephedrine-acetaminophen (TYLENOL SINUS) 30-500 MG TABS tablet, Take 1 tablet by mouth daily as needed (pain). , Disp: , Rfl:  .  riTUXimab (RITUXAN) 100 MG/10ML injection, Inject into the vein., Disp: , Rfl:  .  sucralfate (CARAFATE) 1 GM/10ML suspension, TAKE 10 MLS (1 G TOTAL) BY MOUTH EVERY 8 (EIGHT) HOURS AS NEEDED., Disp: 420 mL, Rfl: 3 .  tiZANidine (ZANAFLEX) 4 MG capsule, , Disp: , Rfl:  .  atorvastatin (LIPITOR) 40 MG tablet, Take 1 tablet (40 mg total) by mouth daily at 6 PM. (Patient not taking: Reported on 12/16/2019), Disp: 90 tablet, Rfl: 1 .  benzonatate (TESSALON) 200 MG capsule, Take 1 capsule (200 mg total) by mouth 2 (two) times daily as needed for cough. (Patient not taking: Reported on 12/16/2019), Disp: 20 capsule, Rfl: 0 .  gabapentin (NEURONTIN) 300 MG capsule, Take 2 capsules (600 mg total) by mouth 3 (three) times daily. (Patient taking differently: Take 900 mg by mouth 3 (three) times daily. ), Disp: 180 capsule, Rfl: 0 .  ipratropium-albuterol (DUONEB) 0.5-2.5 (3) MG/3ML SOLN, USE 1 VIAL IN NEBULIZER EVERY 6 HOURS - And as needed (Patient not taking: Reported on 12/16/2019), Disp: 120 mL, Rfl: 11 .  omeprazole (PRILOSEC) 40 MG capsule, TAKE 1 CAPSULE BY MOUTH EVERY DAY (Patient not taking: Reported on 07/25/2019), Disp: 90 capsule, Rfl: 1 .  PROAIR HFA 108 (90 Base) MCG/ACT inhaler, INHALE 2 PUFFS INTO THE LUNGS EVERY 4 (FOUR) HOURS AS NEEDED FOR WHEEZING OR SHORTNESS OF BREATH. (Patient not taking: Reported on 12/16/2019), Disp: 8.5 Inhaler, Rfl: 1 Medication Side Effects: none  Family Medical/ Social History: Changes? No  MENTAL HEALTH  EXAM:  There were no vitals taken for this visit.There is no height or weight on file to calculate BMI.  General Appearance: unable to assess  Eye Contact:  unable to assess  Speech:  Clear and Coherent and Slow  Volume:  Normal  Mood:  Euthymic  Affect:  unable to assess  Thought Process:  Goal Directed and Descriptions of Associations: Intact  Orientation:  Full (Time, Place, and Person)  Thought Content: Logical   Suicidal Thoughts:  No  Homicidal Thoughts:  No  Memory:  WNL  Judgement:  Good  Insight:  Good  Psychomotor Activity:  unable to assess  Concentration:  Concentration: Good  Recall:  Good  Fund of Knowledge: Good  Language: Good  Assets:  Desire for Improvement  ADLs decreased d/t MS but able to go to kitchen, toilet alone.  Cognition: WNL  Prognosis:  Fair    DIAGNOSES:    ICD-10-CM   1. Major depressive disorder, recurrent episode, moderate (H[facility])  F33.1   2. Generalized anxiety disorder  F41.1   3.  Insomnia, unspecified type  G47.00   4. MS (multiple sclerosis) ([place])  G35     Receiving Psychotherapy: Yes With [name], LCSW.   RECOMMENDATIONS:  PDMP was reviewed. Continue Wellbutrin SR 200 mg, 1 p.o. twice daily. ( is only doing it once a day.  So we will leave it at that for now.) Continue Celexa 40 mg, 1 p.o. daily. Continue Gabapentin 300mg , 3 tid per neuro Continue Klonopin 1 mg 3 times daily as needed per neuro. Continue mirtazapine 15 mg, 1/2-1 nightly as needed. Continue therapy with [name], LCSW. Return in 3 months.  Donnal Moat, PA-C

## 2019-12-16 NOTE — Telephone Encounter (Signed)
Angel Maddox, Angel Maddox are scheduled for a virtual visit with your provider today.    Just as we do with appointments in the office, we must obtain your consent to participate.  Your consent will be active for this visit and any virtual visit you may have with one of our providers in the next 365 days.    If you have a MyChart account, I can also send a copy of this consent to you electronically.  All virtual visits are billed to your insurance company just like a traditional visit in the office.  As this is a virtual visit, video technology does not allow for your provider to perform a traditional examination.  This may limit your provider's ability to fully assess your condition.  If your provider identifies any concerns that need to be evaluated in person or the need to arrange testing such as labs, EKG, etc, we will make arrangements to do so.    Although advances in technology are sophisticated, we cannot ensure that it will always work on either your end or our end.  If the connection with a video visit is poor, we may have to switch to a telephone visit.  With either a video or telephone visit, we are not always able to ensure that we have a secure connection.   I need to obtain your verbal consent now.   Are you willing to proceed with your visit today?   Angel Maddox has provided verbal consent on 12/16/2019 for a virtual visit (video or telephone).   Donnal Moat, PA-C 12/16/2019  5:55 PM

## 2019-12-27 ENCOUNTER — Other Ambulatory Visit: Payer: Self-pay | Admitting: Gastroenterology

## 2019-12-27 DIAGNOSIS — Z6820 Body mass index (BMI) 20.0-20.9, adult: Secondary | ICD-10-CM | POA: Diagnosis not present

## 2019-12-27 DIAGNOSIS — Z87891 Personal history of nicotine dependence: Secondary | ICD-10-CM | POA: Diagnosis not present

## 2019-12-27 DIAGNOSIS — J449 Chronic obstructive pulmonary disease, unspecified: Secondary | ICD-10-CM | POA: Diagnosis not present

## 2019-12-27 DIAGNOSIS — Z299 Encounter for prophylactic measures, unspecified: Secondary | ICD-10-CM | POA: Diagnosis not present

## 2019-12-27 DIAGNOSIS — R350 Frequency of micturition: Secondary | ICD-10-CM | POA: Diagnosis not present

## 2019-12-27 DIAGNOSIS — G35 Multiple sclerosis: Secondary | ICD-10-CM | POA: Diagnosis not present

## 2019-12-27 DIAGNOSIS — I10 Essential (primary) hypertension: Secondary | ICD-10-CM | POA: Diagnosis not present

## 2019-12-28 ENCOUNTER — Other Ambulatory Visit: Payer: Self-pay | Admitting: Gastroenterology

## 2019-12-28 DIAGNOSIS — K259 Gastric ulcer, unspecified as acute or chronic, without hemorrhage or perforation: Secondary | ICD-10-CM

## 2019-12-30 ENCOUNTER — Other Ambulatory Visit: Payer: Self-pay | Admitting: Physician Assistant

## 2019-12-31 ENCOUNTER — Ambulatory Visit: Payer: Medicare PPO | Admitting: Psychiatry

## 2020-01-01 DIAGNOSIS — U071 COVID-19: Secondary | ICD-10-CM | POA: Diagnosis not present

## 2020-01-01 DIAGNOSIS — Z20822 Contact with and (suspected) exposure to covid-19: Secondary | ICD-10-CM | POA: Diagnosis not present

## 2020-01-02 ENCOUNTER — Other Ambulatory Visit: Payer: Medicare PPO

## 2020-01-03 ENCOUNTER — Ambulatory Visit: Payer: Medicare PPO | Admitting: Family Medicine

## 2020-01-10 DIAGNOSIS — A419 Sepsis, unspecified organism: Secondary | ICD-10-CM | POA: Diagnosis not present

## 2020-01-10 DIAGNOSIS — J9602 Acute respiratory failure with hypercapnia: Secondary | ICD-10-CM | POA: Diagnosis not present

## 2020-01-10 DIAGNOSIS — G35 Multiple sclerosis: Secondary | ICD-10-CM | POA: Diagnosis not present

## 2020-01-10 DIAGNOSIS — R05 Cough: Secondary | ICD-10-CM | POA: Diagnosis not present

## 2020-01-10 DIAGNOSIS — R0689 Other abnormalities of breathing: Secondary | ICD-10-CM | POA: Diagnosis not present

## 2020-01-10 DIAGNOSIS — J9601 Acute respiratory failure with hypoxia: Secondary | ICD-10-CM | POA: Diagnosis not present

## 2020-01-10 DIAGNOSIS — R410 Disorientation, unspecified: Secondary | ICD-10-CM | POA: Diagnosis not present

## 2020-01-10 DIAGNOSIS — R0902 Hypoxemia: Secondary | ICD-10-CM | POA: Diagnosis not present

## 2020-01-10 DIAGNOSIS — F319 Bipolar disorder, unspecified: Secondary | ICD-10-CM | POA: Diagnosis not present

## 2020-01-10 DIAGNOSIS — U071 COVID-19: Secondary | ICD-10-CM | POA: Diagnosis not present

## 2020-01-10 DIAGNOSIS — J440 Chronic obstructive pulmonary disease with acute lower respiratory infection: Secondary | ICD-10-CM | POA: Diagnosis not present

## 2020-01-10 DIAGNOSIS — R4182 Altered mental status, unspecified: Secondary | ICD-10-CM | POA: Diagnosis not present

## 2020-01-10 DIAGNOSIS — R52 Pain, unspecified: Secondary | ICD-10-CM | POA: Diagnosis not present

## 2020-01-10 DIAGNOSIS — J1282 Pneumonia due to coronavirus disease 2019: Secondary | ICD-10-CM | POA: Diagnosis not present

## 2020-01-10 DIAGNOSIS — E860 Dehydration: Secondary | ICD-10-CM | POA: Diagnosis not present

## 2020-01-10 DIAGNOSIS — R404 Transient alteration of awareness: Secondary | ICD-10-CM | POA: Diagnosis not present

## 2020-01-17 DIAGNOSIS — J80 Acute respiratory distress syndrome: Secondary | ICD-10-CM | POA: Diagnosis not present

## 2020-01-17 DIAGNOSIS — R0902 Hypoxemia: Secondary | ICD-10-CM | POA: Diagnosis not present

## 2020-01-17 DIAGNOSIS — J1282 Pneumonia due to coronavirus disease 2019: Secondary | ICD-10-CM | POA: Diagnosis not present

## 2020-01-17 DIAGNOSIS — Z4682 Encounter for fitting and adjustment of non-vascular catheter: Secondary | ICD-10-CM | POA: Diagnosis not present

## 2020-01-17 DIAGNOSIS — R6521 Severe sepsis with septic shock: Secondary | ICD-10-CM | POA: Diagnosis not present

## 2020-01-17 DIAGNOSIS — R404 Transient alteration of awareness: Secondary | ICD-10-CM | POA: Diagnosis not present

## 2020-01-17 DIAGNOSIS — J9601 Acute respiratory failure with hypoxia: Secondary | ICD-10-CM | POA: Diagnosis not present

## 2020-01-17 DIAGNOSIS — Z8719 Personal history of other diseases of the digestive system: Secondary | ICD-10-CM | POA: Diagnosis not present

## 2020-01-17 DIAGNOSIS — A419 Sepsis, unspecified organism: Secondary | ICD-10-CM | POA: Diagnosis not present

## 2020-01-17 DIAGNOSIS — I129 Hypertensive chronic kidney disease with stage 1 through stage 4 chronic kidney disease, or unspecified chronic kidney disease: Secondary | ICD-10-CM | POA: Diagnosis not present

## 2020-01-17 DIAGNOSIS — J440 Chronic obstructive pulmonary disease with acute lower respiratory infection: Secondary | ICD-10-CM | POA: Diagnosis not present

## 2020-01-17 DIAGNOSIS — R112 Nausea with vomiting, unspecified: Secondary | ICD-10-CM | POA: Diagnosis not present

## 2020-01-17 DIAGNOSIS — R6520 Severe sepsis without septic shock: Secondary | ICD-10-CM | POA: Diagnosis not present

## 2020-01-17 DIAGNOSIS — J449 Chronic obstructive pulmonary disease, unspecified: Secondary | ICD-10-CM | POA: Diagnosis not present

## 2020-01-17 DIAGNOSIS — J189 Pneumonia, unspecified organism: Secondary | ICD-10-CM | POA: Diagnosis not present

## 2020-01-17 DIAGNOSIS — G35 Multiple sclerosis: Secondary | ICD-10-CM | POA: Diagnosis not present

## 2020-01-17 DIAGNOSIS — R4182 Altered mental status, unspecified: Secondary | ICD-10-CM | POA: Diagnosis not present

## 2020-01-17 DIAGNOSIS — R52 Pain, unspecified: Secondary | ICD-10-CM | POA: Diagnosis not present

## 2020-01-17 DIAGNOSIS — J9690 Respiratory failure, unspecified, unspecified whether with hypoxia or hypercapnia: Secondary | ICD-10-CM | POA: Diagnosis not present

## 2020-01-17 DIAGNOSIS — U071 COVID-19: Secondary | ICD-10-CM | POA: Diagnosis not present

## 2020-01-17 DIAGNOSIS — R0600 Dyspnea, unspecified: Secondary | ICD-10-CM | POA: Diagnosis not present

## 2020-01-17 DIAGNOSIS — D649 Anemia, unspecified: Secondary | ICD-10-CM | POA: Diagnosis not present

## 2020-01-17 DIAGNOSIS — I10 Essential (primary) hypertension: Secondary | ICD-10-CM | POA: Diagnosis not present

## 2020-01-17 DIAGNOSIS — Z452 Encounter for adjustment and management of vascular access device: Secondary | ICD-10-CM | POA: Diagnosis not present

## 2020-01-17 DIAGNOSIS — R0602 Shortness of breath: Secondary | ICD-10-CM | POA: Diagnosis not present

## 2020-01-17 DIAGNOSIS — J984 Other disorders of lung: Secondary | ICD-10-CM | POA: Diagnosis not present

## 2020-01-17 DIAGNOSIS — N189 Chronic kidney disease, unspecified: Secondary | ICD-10-CM | POA: Diagnosis not present

## 2020-01-17 DIAGNOSIS — J90 Pleural effusion, not elsewhere classified: Secondary | ICD-10-CM | POA: Diagnosis not present

## 2020-01-17 DIAGNOSIS — A4189 Other specified sepsis: Secondary | ICD-10-CM | POA: Diagnosis not present

## 2020-01-17 DIAGNOSIS — R918 Other nonspecific abnormal finding of lung field: Secondary | ICD-10-CM | POA: Diagnosis not present

## 2020-01-17 DIAGNOSIS — M6281 Muscle weakness (generalized): Secondary | ICD-10-CM | POA: Diagnosis not present

## 2020-01-17 DIAGNOSIS — Z79899 Other long term (current) drug therapy: Secondary | ICD-10-CM | POA: Diagnosis not present

## 2020-01-17 DIAGNOSIS — D496 Neoplasm of unspecified behavior of brain: Secondary | ICD-10-CM | POA: Diagnosis not present

## 2020-01-17 DIAGNOSIS — D849 Immunodeficiency, unspecified: Secondary | ICD-10-CM | POA: Diagnosis not present

## 2020-01-17 DIAGNOSIS — F419 Anxiety disorder, unspecified: Secondary | ICD-10-CM | POA: Diagnosis not present

## 2020-01-17 DIAGNOSIS — J9600 Acute respiratory failure, unspecified whether with hypoxia or hypercapnia: Secondary | ICD-10-CM | POA: Diagnosis not present

## 2020-01-17 DIAGNOSIS — K9429 Other complications of gastrostomy: Secondary | ICD-10-CM | POA: Diagnosis not present

## 2020-01-17 DIAGNOSIS — D329 Benign neoplasm of meninges, unspecified: Secondary | ICD-10-CM | POA: Diagnosis not present

## 2020-01-17 DIAGNOSIS — R05 Cough: Secondary | ICD-10-CM | POA: Diagnosis not present

## 2020-01-17 DIAGNOSIS — F319 Bipolar disorder, unspecified: Secondary | ICD-10-CM | POA: Diagnosis not present

## 2020-01-31 ENCOUNTER — Other Ambulatory Visit: Payer: Medicare PPO

## 2020-02-07 DIAGNOSIS — N189 Chronic kidney disease, unspecified: Secondary | ICD-10-CM | POA: Diagnosis not present

## 2020-02-07 DIAGNOSIS — A419 Sepsis, unspecified organism: Secondary | ICD-10-CM | POA: Diagnosis not present

## 2020-02-07 DIAGNOSIS — F3189 Other bipolar disorder: Secondary | ICD-10-CM | POA: Diagnosis not present

## 2020-02-07 DIAGNOSIS — E7849 Other hyperlipidemia: Secondary | ICD-10-CM | POA: Diagnosis not present

## 2020-02-07 DIAGNOSIS — F419 Anxiety disorder, unspecified: Secondary | ICD-10-CM | POA: Diagnosis not present

## 2020-02-07 DIAGNOSIS — J449 Chronic obstructive pulmonary disease, unspecified: Secondary | ICD-10-CM | POA: Diagnosis not present

## 2020-02-07 DIAGNOSIS — J1282 Pneumonia due to coronavirus disease 2019: Secondary | ICD-10-CM | POA: Diagnosis not present

## 2020-02-07 DIAGNOSIS — I10 Essential (primary) hypertension: Secondary | ICD-10-CM | POA: Diagnosis not present

## 2020-02-07 DIAGNOSIS — R4182 Altered mental status, unspecified: Secondary | ICD-10-CM | POA: Diagnosis not present

## 2020-02-07 DIAGNOSIS — G35 Multiple sclerosis: Secondary | ICD-10-CM | POA: Diagnosis not present

## 2020-02-07 DIAGNOSIS — J9601 Acute respiratory failure with hypoxia: Secondary | ICD-10-CM | POA: Diagnosis not present

## 2020-02-07 DIAGNOSIS — K219 Gastro-esophageal reflux disease without esophagitis: Secondary | ICD-10-CM | POA: Diagnosis not present

## 2020-02-07 DIAGNOSIS — R6520 Severe sepsis without septic shock: Secondary | ICD-10-CM | POA: Diagnosis not present

## 2020-02-07 DIAGNOSIS — J80 Acute respiratory distress syndrome: Secondary | ICD-10-CM | POA: Diagnosis not present

## 2020-02-07 DIAGNOSIS — F319 Bipolar disorder, unspecified: Secondary | ICD-10-CM | POA: Diagnosis not present

## 2020-02-07 DIAGNOSIS — F411 Generalized anxiety disorder: Secondary | ICD-10-CM | POA: Diagnosis not present

## 2020-02-07 DIAGNOSIS — D649 Anemia, unspecified: Secondary | ICD-10-CM | POA: Diagnosis not present

## 2020-02-07 DIAGNOSIS — I129 Hypertensive chronic kidney disease with stage 1 through stage 4 chronic kidney disease, or unspecified chronic kidney disease: Secondary | ICD-10-CM | POA: Diagnosis not present

## 2020-02-07 DIAGNOSIS — J984 Other disorders of lung: Secondary | ICD-10-CM | POA: Diagnosis not present

## 2020-02-07 DIAGNOSIS — E559 Vitamin D deficiency, unspecified: Secondary | ICD-10-CM | POA: Diagnosis not present

## 2020-02-07 DIAGNOSIS — U071 COVID-19: Secondary | ICD-10-CM | POA: Diagnosis not present

## 2020-02-07 DIAGNOSIS — M6281 Muscle weakness (generalized): Secondary | ICD-10-CM | POA: Diagnosis not present

## 2020-02-07 DIAGNOSIS — F3341 Major depressive disorder, recurrent, in partial remission: Secondary | ICD-10-CM | POA: Diagnosis not present

## 2020-02-07 DIAGNOSIS — E785 Hyperlipidemia, unspecified: Secondary | ICD-10-CM | POA: Diagnosis not present

## 2020-02-10 DIAGNOSIS — J1282 Pneumonia due to coronavirus disease 2019: Secondary | ICD-10-CM | POA: Diagnosis not present

## 2020-02-10 DIAGNOSIS — I10 Essential (primary) hypertension: Secondary | ICD-10-CM | POA: Diagnosis not present

## 2020-02-10 DIAGNOSIS — F411 Generalized anxiety disorder: Secondary | ICD-10-CM | POA: Diagnosis not present

## 2020-02-10 DIAGNOSIS — K219 Gastro-esophageal reflux disease without esophagitis: Secondary | ICD-10-CM | POA: Diagnosis not present

## 2020-02-10 DIAGNOSIS — F3341 Major depressive disorder, recurrent, in partial remission: Secondary | ICD-10-CM | POA: Diagnosis not present

## 2020-02-10 DIAGNOSIS — J984 Other disorders of lung: Secondary | ICD-10-CM | POA: Diagnosis not present

## 2020-02-10 DIAGNOSIS — G35 Multiple sclerosis: Secondary | ICD-10-CM | POA: Diagnosis not present

## 2020-02-10 DIAGNOSIS — U071 COVID-19: Secondary | ICD-10-CM | POA: Diagnosis not present

## 2020-02-10 DIAGNOSIS — F3189 Other bipolar disorder: Secondary | ICD-10-CM | POA: Diagnosis not present

## 2020-02-17 ENCOUNTER — Other Ambulatory Visit (HOSPITAL_COMMUNITY): Payer: Self-pay

## 2020-02-17 DIAGNOSIS — F411 Generalized anxiety disorder: Secondary | ICD-10-CM | POA: Diagnosis not present

## 2020-02-17 DIAGNOSIS — G35 Multiple sclerosis: Secondary | ICD-10-CM | POA: Diagnosis not present

## 2020-02-17 DIAGNOSIS — U071 COVID-19: Secondary | ICD-10-CM | POA: Diagnosis not present

## 2020-02-17 DIAGNOSIS — E785 Hyperlipidemia, unspecified: Secondary | ICD-10-CM | POA: Diagnosis not present

## 2020-02-18 ENCOUNTER — Encounter (HOSPITAL_COMMUNITY): Payer: Medicare Other

## 2020-02-18 ENCOUNTER — Inpatient Hospital Stay (HOSPITAL_COMMUNITY): Admission: RE | Admit: 2020-02-18 | Payer: Medicare PPO | Source: Ambulatory Visit

## 2020-02-19 DIAGNOSIS — J984 Other disorders of lung: Secondary | ICD-10-CM | POA: Diagnosis not present

## 2020-02-19 DIAGNOSIS — U071 COVID-19: Secondary | ICD-10-CM | POA: Diagnosis not present

## 2020-02-19 DIAGNOSIS — E7849 Other hyperlipidemia: Secondary | ICD-10-CM | POA: Diagnosis not present

## 2020-02-19 DIAGNOSIS — F411 Generalized anxiety disorder: Secondary | ICD-10-CM | POA: Diagnosis not present

## 2020-02-19 DIAGNOSIS — J1282 Pneumonia due to coronavirus disease 2019: Secondary | ICD-10-CM | POA: Diagnosis not present

## 2020-02-19 DIAGNOSIS — G35 Multiple sclerosis: Secondary | ICD-10-CM | POA: Diagnosis not present

## 2020-02-19 DIAGNOSIS — F3341 Major depressive disorder, recurrent, in partial remission: Secondary | ICD-10-CM | POA: Diagnosis not present

## 2020-02-19 DIAGNOSIS — F3189 Other bipolar disorder: Secondary | ICD-10-CM | POA: Diagnosis not present

## 2020-02-19 DIAGNOSIS — I10 Essential (primary) hypertension: Secondary | ICD-10-CM | POA: Diagnosis not present

## 2020-02-24 ENCOUNTER — Telehealth: Payer: Self-pay | Admitting: Internal Medicine

## 2020-02-24 NOTE — Telephone Encounter (Signed)
error 

## 2020-02-25 ENCOUNTER — Other Ambulatory Visit: Payer: Self-pay | Admitting: Physician Assistant

## 2020-02-26 DIAGNOSIS — Z299 Encounter for prophylactic measures, unspecified: Secondary | ICD-10-CM | POA: Diagnosis not present

## 2020-02-26 DIAGNOSIS — R05 Cough: Secondary | ICD-10-CM | POA: Diagnosis not present

## 2020-02-26 DIAGNOSIS — I10 Essential (primary) hypertension: Secondary | ICD-10-CM | POA: Diagnosis not present

## 2020-02-26 DIAGNOSIS — F319 Bipolar disorder, unspecified: Secondary | ICD-10-CM | POA: Diagnosis not present

## 2020-02-26 DIAGNOSIS — G35 Multiple sclerosis: Secondary | ICD-10-CM | POA: Diagnosis not present

## 2020-02-28 ENCOUNTER — Encounter (HOSPITAL_COMMUNITY): Payer: Medicare PPO

## 2020-03-01 ENCOUNTER — Other Ambulatory Visit: Payer: Self-pay | Admitting: Gastroenterology

## 2020-03-03 ENCOUNTER — Encounter (HOSPITAL_COMMUNITY): Payer: Medicare PPO

## 2020-03-03 ENCOUNTER — Ambulatory Visit (HOSPITAL_COMMUNITY): Payer: Medicare PPO

## 2020-03-03 ENCOUNTER — Encounter (HOSPITAL_COMMUNITY): Payer: Medicare Other

## 2020-03-06 ENCOUNTER — Telehealth: Payer: Self-pay | Admitting: Internal Medicine

## 2020-03-06 MED ORDER — IPRATROPIUM-ALBUTEROL 0.5-2.5 (3) MG/3ML IN SOLN: RESPIRATORY_TRACT | 11 refills | Status: DC

## 2020-03-06 NOTE — Telephone Encounter (Signed)
Ok to use a held spot. Ok to refill her Duoneb

## 2020-03-06 NOTE — Telephone Encounter (Signed)
Spoke with patient regarding refill request for her Duoneb Patient stated she stopped her Duoneb but she will like refills sent into [facility].Patient also wanted to see if she can get a earlier appt. Patient's next appt is 05/11/20. Dr.Younf Can I use one of you held spots for patient to come in?  Dr.Young can you please advise.  Thank you

## 2020-03-06 NOTE — Telephone Encounter (Signed)
Spoke with patient regarding prior message. Per Dr.Young ok to use a held spot . Made a appt for 03/20/20 at 11:30. Sent in medication for Duoneb . Patient is aware and voice is understanding. Nothing else further needed.

## 2020-03-17 ENCOUNTER — Ambulatory Visit: Payer: Medicare PPO | Admitting: Physician Assistant

## 2020-03-18 ENCOUNTER — Telehealth: Payer: Self-pay | Admitting: Internal Medicine

## 2020-03-18 DIAGNOSIS — D72819 Decreased white blood cell count, unspecified: Secondary | ICD-10-CM | POA: Diagnosis not present

## 2020-03-18 DIAGNOSIS — F339 Major depressive disorder, recurrent, unspecified: Secondary | ICD-10-CM | POA: Diagnosis not present

## 2020-03-18 DIAGNOSIS — R296 Repeated falls: Secondary | ICD-10-CM | POA: Diagnosis not present

## 2020-03-18 DIAGNOSIS — G4710 Hypersomnia, unspecified: Secondary | ICD-10-CM | POA: Diagnosis not present

## 2020-03-18 DIAGNOSIS — R2689 Other abnormalities of gait and mobility: Secondary | ICD-10-CM | POA: Diagnosis not present

## 2020-03-18 DIAGNOSIS — D849 Immunodeficiency, unspecified: Secondary | ICD-10-CM | POA: Diagnosis not present

## 2020-03-18 DIAGNOSIS — G35 Multiple sclerosis: Secondary | ICD-10-CM | POA: Diagnosis not present

## 2020-03-18 NOTE — Telephone Encounter (Signed)
Pharmacy is [facility]. Phone number is [phone]. Only have 4 packs left.

## 2020-03-18 NOTE — Telephone Encounter (Signed)
ATC, left VM letting patient know we would take care of her refill of duoneb.  Script sent to [facility]. Called [facility] at [phone], verified [facility] already has an order for Duoneb, advised to have patient to call local [facility] to have medication ordered and delivered.  ATC patient, left vm letting her know to call her local [facility] to get duoneb ordered and delivered.  Advised to call with any questions.

## 2020-03-20 ENCOUNTER — Encounter: Payer: Self-pay | Admitting: Internal Medicine

## 2020-03-20 ENCOUNTER — Ambulatory Visit: Payer: Medicare PPO | Admitting: Internal Medicine

## 2020-03-20 ENCOUNTER — Other Ambulatory Visit: Payer: Self-pay

## 2020-03-20 ENCOUNTER — Ambulatory Visit (INDEPENDENT_AMBULATORY_CARE_PROVIDER_SITE_OTHER): Payer: Medicare PPO

## 2020-03-20 VITALS — BP 118/62 | HR 101 | Temp 98.3°F | Ht 67.0 in | Wt 119.6 lb

## 2020-03-20 DIAGNOSIS — R0609 Other forms of dyspnea: Secondary | ICD-10-CM

## 2020-03-20 DIAGNOSIS — J449 Chronic obstructive pulmonary disease, unspecified: Secondary | ICD-10-CM | POA: Diagnosis not present

## 2020-03-20 DIAGNOSIS — R0600 Dyspnea, unspecified: Secondary | ICD-10-CM

## 2020-03-20 DIAGNOSIS — U071 COVID-19: Secondary | ICD-10-CM | POA: Diagnosis not present

## 2020-03-20 DIAGNOSIS — K2100 Gastro-esophageal reflux disease with esophagitis, without bleeding: Secondary | ICD-10-CM

## 2020-03-20 DIAGNOSIS — J189 Pneumonia, unspecified organism: Secondary | ICD-10-CM | POA: Diagnosis not present

## 2020-03-20 MED ORDER — BREZTRI AEROSPHERE 160-9-4.8 MCG/ACT IN AERO: 2.0000 | INHALATION_SPRAY | Freq: Two times a day (BID) | RESPIRATORY_TRACT | 0 refills | Status: DC

## 2020-03-20 NOTE — Assessment & Plan Note (Signed)
Responds to nebulizer, reflecting bronchitis component. Plan- samples Breztri for trial

## 2020-03-20 NOTE — Assessment & Plan Note (Signed)
Mainly due to deconditioning and weakness from MS.  Can't exclude post-Covid infection residual. Plan- CXR

## 2020-03-20 NOTE — Progress Notes (Signed)
HPI  64 yo F former smoker followed for asthma/ COPD, dyspnea, complicated by Multiple Sclerosis,  GERD, Chronic constipation,  Osteoporosis, Depression/ Anxiety, Malnutrition, Hyponatremia, abnormal LFTs ECHO 12/20/18- EF 60-65%, WNL PFT 04/29/2019- WNL except DLCO minimally reduced. FV loops show vocal tremor Walk test room air 03/20/20- Starting sat 95%, fell to 92%, Max HR 104. Limited by weakness. ----------------------------------------------------------------------------   04/29/2019- 39 yo F former smoker followed for asthma/ COPD, dyspnea, complicated by Multiple Sclerosis,  Anxiety,  GERD/ Gastritis, Chronic constipation,  Osteoporosis, Depression/ Anxiety, Malnutrition, Hyponatremia, abnormal LFTs Medication includes  albuterol hfa, Neb Duoneb, clonazepam, gabapentin, welbutrin, citalopram, mirtazapine, provigil, Rituxan Sodium 128 on 6/25 UTD flu vax -----4 month f/u PFT. 170/110 on arrival  [place] in July with hyponatremia and "seizure-like activity". PFT reviewed, noting vibration in airflow c/w vocal cord tremor. She asks why she is SOB with these PFT scores. Discussed weakness.  "Lungs tighten up a lot"- might indicate asthma. Says Anoro may help a little, doesn't last all day. No wheeze. We discussed change to try Breo.  PFT 04/29/2019- WNL except DLCO minimally reduced. FV loops show vocal tremor CXR 1V- 12/19/2018-  No acute abnormality.  03/20/20- 60 yo F former smoker followed for asthma/ COPD, dyspnea, complicated by Multiple Sclerosis,  Anxiety,  GERD/ Gastritis, Chronic constipation,  Osteoporosis, Depression/ Anxiety, Malnutrition, Hyponatremia, abnormal LFTs, Covid infection July, 2021 [facility]  Medication includes  albuterol hfa, Neb Duoneb, clonazepam, gabapentin, welbutrin, citalopram, mirtazapine, provigil, Rituxan Covid vax- 2 Phizer (before coivid infection) Flu vax- her MS doctor adbvised her to wait on this [place]- 7/23-8/13/21- Covid, ARDS, discharged to SNF   copd,DOE, post covid follow up, MS symptoms worse, morning productive cough, clear -----phlegm, wakes at night with dry cough Here now w husband. No O2. Easy DOE, Left leg feels weaker than Right. Dry cough begins as she lies down at night. Aware of some reflux, has Prilosec. Cough productive and nose runs when firsst up and moving in the morning, then not much cough through day. Nebulizer helps- 2-4x/ day. Breo was little help- didn't last through day.  Rituxan was put on hold by MS doctor, pending restart.  Walk test room air 03/20/20- Starting sat 95%, fell to 92%, Max HR 104. Limited by weakness.  ROS-see HPI   + = positive Constitutional:    weight loss, night sweats, fevers, chills, fatigue, lassitude. HEENT:    headaches, difficulty swallowing, tooth/dental problems, sore throat,       sneezing, itching, ear ache, nasal congestion, post nasal drip, snoring CV:    chest pain, orthopnea, PND, swelling in lower extremities, anasarca,                                  dizziness, palpitations Resp:   +shortness of breath with exertion or at rest.                +productive cough,   +non-productive cough, coughing up of blood.              change in color of mucus.  wheezing.   Skin:    rash or lesions. GI:  No-   heartburn, indigestion, +abdominal pain, nausea, vomiting, diarrhea,                 change in bowel habits, loss of appetite GU: dysuria, change in color of urine, no urgency or frequency.   flank pain. MS:  joint pain, stiffness, decreased range of motion, back pain. Neuro-   + MS Psych:  change in mood or affect.  depression or +anxiety.   memory loss.  OBJ- Physical Exam General- Alert, Oriented, Affect-anxious, Distress- none acute, + rolling walker Skin- rash-none, lesions- none, excoriation- none Lymphadenopathy- none Head- atraumatic            Eyes- Gross vision intact, PERRLA, conjunctivae and secretions clear            Ears- Hearing, canals-normal            Nose-  Clear, no-Septal dev, mucus, polyps, erosion, perforation             Throat- Mallampati II , mucosa clear , drainage- none, tonsils- atrophic Neck- flexible , trachea midline, no stridor , thyroid nl, carotid no bruit Chest - symmetrical excursion , unlabored           Heart/CV- RRR , no murmur , no gallop  , no rub, nl s1 s2                           - JVD- none , edema- none, stasis changes- none, varices- none           Lung- clear to P&A, wheeze- none, cough- none , dullness-none, rub- none           Chest wall-  Abd-  Br/ Gen/ Rectal- Not done, not indicated Extrem- cyanosis- none, clubbing, none, atrophy- none,  Neuro- grossly intact to observation

## 2020-03-20 NOTE — Assessment & Plan Note (Signed)
Dry cough while lying down, might be reflux related. Plan- Continue Prilosec. Elevate HOB on bricks.

## 2020-03-20 NOTE — Patient Instructions (Signed)
Order-   Walk test on room air    Dx DOE  Sample x 2 Breztri inhaler    Inhale 2 puffs then rinse mouth, twice daily  See if this helps your breathing. You can still use your nebulizer machine if needed.  Order- CXR    Dx post Covid infection  Try raising the head of your bed with a brick (3 holes/ baggie) under head legs. See if this helps with the dry cough at night  Ok to try taking an oxycodone 30 minutes or so before bedtime if needed, to reduce cough

## 2020-03-20 NOTE — Assessment & Plan Note (Signed)
Remains off Rituxan until re-started by MS doctor. Plan- CXR looking for residual fibrosis

## 2020-03-23 ENCOUNTER — Telehealth: Payer: Self-pay | Admitting: Internal Medicine

## 2020-03-23 DIAGNOSIS — H52203 Unspecified astigmatism, bilateral: Secondary | ICD-10-CM | POA: Diagnosis not present

## 2020-03-23 DIAGNOSIS — H5213 Myopia, bilateral: Secondary | ICD-10-CM | POA: Diagnosis not present

## 2020-03-23 DIAGNOSIS — H524 Presbyopia: Secondary | ICD-10-CM | POA: Diagnosis not present

## 2020-03-23 DIAGNOSIS — H25813 Combined forms of age-related cataract, bilateral: Secondary | ICD-10-CM | POA: Diagnosis not present

## 2020-03-23 NOTE — Telephone Encounter (Signed)
Angel Lever, MD  03/23/2020 9:07 AM EDT     CXR- Significant improvement since Covid pneumonia. No new or progressive concerns.   Called and spoke with pt letting her know the results of cxr and she verbalized understanding. Nothing further needed.

## 2020-03-25 ENCOUNTER — Ambulatory Visit (INDEPENDENT_AMBULATORY_CARE_PROVIDER_SITE_OTHER): Payer: Medicare PPO | Admitting: Physician Assistant

## 2020-03-25 ENCOUNTER — Encounter: Payer: Self-pay | Admitting: Physician Assistant

## 2020-03-25 ENCOUNTER — Other Ambulatory Visit: Payer: Self-pay

## 2020-03-25 DIAGNOSIS — F411 Generalized anxiety disorder: Secondary | ICD-10-CM

## 2020-03-25 DIAGNOSIS — G4700 Insomnia, unspecified: Secondary | ICD-10-CM

## 2020-03-25 DIAGNOSIS — F3341 Major depressive disorder, recurrent, in partial remission: Secondary | ICD-10-CM

## 2020-03-25 DIAGNOSIS — G35 Multiple sclerosis: Secondary | ICD-10-CM

## 2020-03-25 NOTE — Progress Notes (Signed)
[facility]  Patient ID: Angel Maddox,  MRN: [id number]  PCP: Monico Blitz, MD  Date of Evaluation: 03/25/2020 Time spent:20 minutes   HISTORY/CURRENT STATUS: HPI For routine med check.   She had covid in early July, ended up with bilat pneumonia. Was on ventilator for several weeks. Was in [facility]. She doesn't remember anything for several weeks. She had been fully vaccinated too. Still dealing with weakness, even though she went to rehab. "They said I almost died."  Doing well with mental health, even with all she's been through physically.  Is able to enjoy things when she's physically able. Still very SOB after the covid pneumonia. And having difficulty walking d/t MS. Doesn't cry easily, but she still will tear up sometimes when she thinks about [name], her son who died several years ago. But overall, is doing well. No SI/HI.  Patient denies increased energy with decreased need for sleep, no increased talkativeness, no racing thoughts, no impulsivity or risky behaviors, no increased spending, no increased libido, no grandiosity, no increased irritability or anger, and no hallucinations.  Anxiety is controlled.  States she is taking the Klonopin 1 mg, one half usually at night sometimes during the day another half.  Individual Medical History/ Review of Systems: Changes? :Yes d/t covid she has become weaker and it's more difficult to walk.  Uses [place], has some tremors.  She had to skip the Rituxan for the MS d/t the pneumonia, and immune deficiency assoc w/ it.  Will start it back in about a month.    Past medications for mental health diagnoses include: Depakote, Lamictal, Risperdal, Celexa, Klonopin, trazodone, Wellbutrin, Vistaril, primidone, gabapentin, Prozac, Zyprexa, Ambien, Lunesta, Effexor  Allergies: Hydrocodone  Current Medications:  Current Outpatient Medications:  .  amLODipine (NORVASC) 10 MG tablet, Take 1 tablet (10 mg total) by mouth daily.,  Disp: 90 tablet, Rfl: 1 .  baclofen (LIORESAL) 10 MG tablet, Take 10 mg by mouth 3 (three) times daily., Disp: , Rfl:  .  benzonatate (TESSALON) 200 MG capsule, Take 1 capsule (200 mg total) by mouth 2 (two) times daily as needed for cough., Disp: 20 capsule, Rfl: 0 .  Budeson-Glycopyrrol-Formoterol (BREZTRI AEROSPHERE) 160-9-4.8 MCG/ACT AERO, Inhale 2 puffs into the lungs 2 (two) times daily., Disp: 10.7 g, Rfl: 0 .  buPROPion (WELLBUTRIN SR) 200 MG 12 hr tablet, Take 1 tablet (200 mg total) by mouth 2 (two) times daily., Disp: 60 tablet, Rfl: 5 .  citalopram (CELEXA) 40 MG tablet, TAKE 1 TABLET BY MOUTH EVERY DAY, Disp: 90 tablet, Rfl: 2 .  clonazePAM (KLONOPIN) 1 MG tablet, Take 0.5 mg by mouth 2 (two) times daily as needed. , Disp: , Rfl:  .  dalfampridine 10 MG TB12, , Disp: , Rfl:  .  ipratropium-albuterol (DUONEB) 0.5-2.5 (3) MG/3ML SOLN, USE 1 VIAL IN NEBULIZER EVERY 6 HOURS - And as needed, Disp: 120 mL, Rfl: 11 .  losartan (COZAAR) 25 MG tablet, TAKE 1 TABLET BY MOUTH EVERY DAY, Disp: 90 tablet, Rfl: 1 .  mirtazapine (REMERON) 15 MG tablet, TAKE 0.5-1 TABLETS (7.5-15 MG TOTAL) BY MOUTH AT BEDTIME AS NEEDED., Disp: 90 tablet, Rfl: 0 .  Nebulizers (COMPRESSOR/NEBULIZER) MISC, Use as directed, Disp: 1 each, Rfl: 0 .  omeprazole (PRILOSEC) 40 MG capsule, Take 1 capsule (40 mg total) by mouth daily before breakfast., Disp: 90 capsule, Rfl: 1 .  ondansetron (ZOFRAN-ODT) 4 MG disintegrating tablet, TAKE 1 TABLET BY MOUTH EVERY 8 HOURS AS NEEDED FOR NAUSEA AND  VOMITING, Disp: 30 tablet, Rfl: 2 .  Oxycodone HCl 10 MG TABS, Take 1 tablet (10 mg total) by mouth every 6 (six) hours as needed ((score 7 to 10))., Disp: 120 tablet, Rfl: 0 .  primidone (MYSOLINE) 250 MG tablet, Take 250 mg by mouth 3 (three) times daily. , Disp: , Rfl:  .  PROAIR HFA 108 (90 Base) MCG/ACT inhaler, INHALE 2 PUFFS INTO THE LUNGS EVERY 4 (FOUR) HOURS AS NEEDED FOR WHEEZING OR SHORTNESS OF BREATH., Disp: 8.5 Inhaler, Rfl: 1 .   riTUXimab (RITUXAN) 100 MG/10ML injection, Inject into the vein., Disp: , Rfl:  .  tiZANidine (ZANAFLEX) 4 MG capsule, , Disp: , Rfl:  .  fluticasone furoate-vilanterol (BREO ELLIPTA) 100-25 MCG/INH AEPB, Inhale 1 puff into the lungs daily. (Patient not taking: Reported on 03/25/2020), Disp: 1 each, Rfl: 0 .  gabapentin (NEURONTIN) 300 MG capsule, Take 2 capsules (600 mg total) by mouth 3 (three) times daily. (Patient taking differently: Take 900 mg by mouth 3 (three) times daily. ), Disp: 180 capsule, Rfl: 0 .  sucralfate (CARAFATE) 1 GM/10ML suspension, TAKE 10 MLS (1 G TOTAL) BY MOUTH EVERY 8 (EIGHT) HOURS AS NEEDED. (Patient not taking: Reported on 03/25/2020), Disp: 420 mL, Rfl: 1 Medication Side Effects: none  Family Medical/ Social History: Changes? No  MENTAL HEALTH EXAM:  There were no vitals taken for this visit.There is no height or weight on file to calculate BMI.  General Appearance: Casual, Neat, Well Groomed and very thin  Eye Contact:  Good  Speech:  Clear and Coherent and Slow  Volume:  Normal  Mood:  Euthymic  Affect:  Appropriate  Thought Process:  Goal Directed and Descriptions of Associations: Intact  Orientation:  Full (Time, Place, and Person)  Thought Content: Logical   Suicidal Thoughts:  No  Homicidal Thoughts:  No  Memory:  WNL  Judgement:  Good  Insight:  Good  Psychomotor Activity:  Very slow to get up from sitting position, legs are tremulous with the [place].l  Concentration:  Concentration: Good  Recall:  Good  Fund of Knowledge: Good  Language: Good  Assets:  Desire for Improvement  ADLs decreased d/t MS but able to go to kitchen, toilet alone.  Cognition: WNL  Prognosis:  Fair    DIAGNOSES:    ICD-10-CM   1. Recurrent major depressive disorder, in partial remission ([place])  F33.41   2. Generalized anxiety disorder  F41.1   3. Insomnia, unspecified type  G47.00   4. MS (multiple sclerosis) ([place])  G35     Receiving Psychotherapy: No     RECOMMENDATIONS:  PDMP was reviewed. I provided 20 minutes of face to face time during this encounter. Very glad she recovered from covid and hope she cont to increase muscle strength and gain stamina.  Continue Wellbutrin SR 200 mg, 1 p.o. twice daily. Continue Celexa 40 mg, 1 p.o. daily. Continue Gabapentin 300mg , 3 tid per neuro Continue Klonopin 1 mg, 1/2 po bid prn per neuro.  Continue mirtazapine 15 mg, 1/2-1 nightly as needed. Return in 3 months.  Donnal Moat, PA-C

## 2020-04-09 ENCOUNTER — Other Ambulatory Visit: Payer: Self-pay | Admitting: Internal Medicine

## 2020-04-09 DIAGNOSIS — I10 Essential (primary) hypertension: Secondary | ICD-10-CM

## 2020-04-13 DIAGNOSIS — F411 Generalized anxiety disorder: Secondary | ICD-10-CM | POA: Diagnosis not present

## 2020-04-13 DIAGNOSIS — Z20822 Contact with and (suspected) exposure to covid-19: Secondary | ICD-10-CM | POA: Diagnosis not present

## 2020-04-13 DIAGNOSIS — T50904A Poisoning by unspecified drugs, medicaments and biological substances, undetermined, initial encounter: Secondary | ICD-10-CM | POA: Diagnosis not present

## 2020-04-13 DIAGNOSIS — G35 Multiple sclerosis: Secondary | ICD-10-CM | POA: Diagnosis not present

## 2020-04-13 DIAGNOSIS — R4182 Altered mental status, unspecified: Secondary | ICD-10-CM | POA: Diagnosis not present

## 2020-04-13 DIAGNOSIS — Z87891 Personal history of nicotine dependence: Secondary | ICD-10-CM | POA: Diagnosis not present

## 2020-04-13 DIAGNOSIS — R404 Transient alteration of awareness: Secondary | ICD-10-CM | POA: Diagnosis not present

## 2020-04-13 DIAGNOSIS — J449 Chronic obstructive pulmonary disease, unspecified: Secondary | ICD-10-CM | POA: Diagnosis not present

## 2020-04-13 DIAGNOSIS — M810 Age-related osteoporosis without current pathological fracture: Secondary | ICD-10-CM | POA: Diagnosis not present

## 2020-04-13 DIAGNOSIS — T50992A Poisoning by other drugs, medicaments and biological substances, intentional self-harm, initial encounter: Secondary | ICD-10-CM | POA: Diagnosis not present

## 2020-04-13 DIAGNOSIS — T887XXA Unspecified adverse effect of drug or medicament, initial encounter: Secondary | ICD-10-CM | POA: Diagnosis not present

## 2020-04-13 DIAGNOSIS — I129 Hypertensive chronic kidney disease with stage 1 through stage 4 chronic kidney disease, or unspecified chronic kidney disease: Secondary | ICD-10-CM | POA: Diagnosis not present

## 2020-04-13 DIAGNOSIS — J90 Pleural effusion, not elsewhere classified: Secondary | ICD-10-CM | POA: Diagnosis not present

## 2020-04-13 DIAGNOSIS — F319 Bipolar disorder, unspecified: Secondary | ICD-10-CM | POA: Diagnosis not present

## 2020-04-14 DIAGNOSIS — T1491XA Suicide attempt, initial encounter: Secondary | ICD-10-CM | POA: Diagnosis not present

## 2020-04-14 DIAGNOSIS — R4182 Altered mental status, unspecified: Secondary | ICD-10-CM | POA: Diagnosis not present

## 2020-04-15 DIAGNOSIS — T1491XA Suicide attempt, initial encounter: Secondary | ICD-10-CM | POA: Diagnosis not present

## 2020-04-15 DIAGNOSIS — R4182 Altered mental status, unspecified: Secondary | ICD-10-CM | POA: Diagnosis not present

## 2020-04-16 DIAGNOSIS — T1491XA Suicide attempt, initial encounter: Secondary | ICD-10-CM | POA: Diagnosis not present

## 2020-04-16 DIAGNOSIS — R4182 Altered mental status, unspecified: Secondary | ICD-10-CM | POA: Diagnosis not present

## 2020-04-17 DIAGNOSIS — F411 Generalized anxiety disorder: Secondary | ICD-10-CM | POA: Diagnosis not present

## 2020-04-17 DIAGNOSIS — Z20822 Contact with and (suspected) exposure to covid-19: Secondary | ICD-10-CM | POA: Diagnosis not present

## 2020-04-17 DIAGNOSIS — J449 Chronic obstructive pulmonary disease, unspecified: Secondary | ICD-10-CM | POA: Diagnosis not present

## 2020-04-17 DIAGNOSIS — T1491XA Suicide attempt, initial encounter: Secondary | ICD-10-CM | POA: Diagnosis not present

## 2020-04-17 DIAGNOSIS — I10 Essential (primary) hypertension: Secondary | ICD-10-CM | POA: Diagnosis not present

## 2020-04-17 DIAGNOSIS — T50992A Poisoning by other drugs, medicaments and biological substances, intentional self-harm, initial encounter: Secondary | ICD-10-CM | POA: Diagnosis not present

## 2020-04-17 DIAGNOSIS — F332 Major depressive disorder, recurrent severe without psychotic features: Secondary | ICD-10-CM | POA: Diagnosis not present

## 2020-04-17 DIAGNOSIS — Z87891 Personal history of nicotine dependence: Secondary | ICD-10-CM | POA: Diagnosis not present

## 2020-04-17 DIAGNOSIS — G35 Multiple sclerosis: Secondary | ICD-10-CM | POA: Diagnosis not present

## 2020-04-17 DIAGNOSIS — F319 Bipolar disorder, unspecified: Secondary | ICD-10-CM | POA: Diagnosis not present

## 2020-04-17 DIAGNOSIS — R4182 Altered mental status, unspecified: Secondary | ICD-10-CM | POA: Diagnosis not present

## 2020-04-17 DIAGNOSIS — M810 Age-related osteoporosis without current pathological fracture: Secondary | ICD-10-CM | POA: Diagnosis not present

## 2020-04-17 DIAGNOSIS — F419 Anxiety disorder, unspecified: Secondary | ICD-10-CM | POA: Diagnosis not present

## 2020-04-17 DIAGNOSIS — I129 Hypertensive chronic kidney disease with stage 1 through stage 4 chronic kidney disease, or unspecified chronic kidney disease: Secondary | ICD-10-CM | POA: Diagnosis not present

## 2020-04-23 ENCOUNTER — Other Ambulatory Visit: Payer: Self-pay

## 2020-04-23 ENCOUNTER — Ambulatory Visit (INDEPENDENT_AMBULATORY_CARE_PROVIDER_SITE_OTHER): Payer: Medicare PPO | Admitting: Psychiatry

## 2020-04-23 DIAGNOSIS — F332 Major depressive disorder, recurrent severe without psychotic features: Secondary | ICD-10-CM

## 2020-04-23 NOTE — Progress Notes (Signed)
[facility] Counselor/Therapist Progress Note  Patient ID: Angel Maddox, MRN: [id number],    Date: 04/23/2020  Time Spent: 60 minutes  11:00am to 12:00noon  Treatment Type: Individual Therapy  Reported Symptoms: anxiety, depression  Mental Status Exam:  Appearance:   Casual     Behavior:  Appropriate, Sharing and Motivated  Motor:  uses her walker, not able to walk much unassisted due to physical issues  Speech/Language:   Clear and Coherent  Affect:  anxiey, depression  Mood:  anxious and depressed  Thought process:  goal directed  Thought content:    WNL  Sensory/Perceptual disturbances:    WNL  Orientation:  oriented to person, place, time/date, situation, day of week, month of year and year  Attention:  Poor  Concentration:  Poor  Memory:  forgetfulness  Fund of knowledge:   Good  Insight:    Fair  Judgment:   Poor  Impulse Control:  Poor   Risk Assessment: Danger to Self:  No Self-injurious Behavior: No Danger to Others: No Duty to Warn:no Physical Aggression / Violence:No  Access to Firearms a concern: No  Gang Involvement:No   Subjective: Patient today reports anxiety and depression after a recent overdose attempt and hospitalization.  Today she is looking neat, more calm, and denies any suicidal ideation.  Explained that she did take an overdose of some of her meds earlier "and did it impulsively" but glad that she survived it and does not want to get herself in that position again.  She is to contact this office or hospital emergency room if she should have any further thoughts of harming herself.  Interventions: Cognitive Behavioral Therapy and Solution-Oriented/Positive Psychology  Diagnosis:   ICD-10-CM   1. Severe episode of recurrent major depressive disorder, without psychotic features ([place])  F33.2      Plan: Patient not signing tx plan updates on computer screen due to [place].   Treatment Goals: Goals may remain on tx plan as  patient works on strategies to meet her goals. Progress will be documented each session in "Progress" section.  Long term goal: Develop the ability to recognize, accept, and cope with feelings of depressionand anxiety.  Short term goal: Learn and implement calming skills to reduce overall tension and moments of increased anxiety and/or depression.Patient will recognize her negative self-talk, interrupt it, and not let it prevent her from trying new strategies to help herself.  Strategy: Teach patient cognitive and somatic calming skills . Rehearse with patient how to apply these skills to her daily life. Review and reinforce success while providing corrective feedback toward consistent implementation.  PROGRESS: Patient in today, brought by husband. Had Covid back in July 2021. Patient reports she took intentional overdose about 2 weeks ago, and husband called EMS and she was transported to hospital in [place], and later admitted to hospital for behavioral health treatment. States she had woke up feeling bad emotionally and ended up taking overdose of "Klonopin and sleeping pills". Treated and admitted to hospital and is here today to follow up with outpatient therapy. States she is glad that her overdose did not result in her death. Has not been seen in therapy since March 2021.  Husband shared some information about patient tending to only see and assume the negative.  Patient states her" mind is sometimes going 90 miles an hour." Per husband she seems to look for things that are negative and to complain about and was demonstrated in session today. Tried to turn  in into a more positive direction for patient and help her see how a lot of her anxious/depressive/negative thoughts never play out, even though a lot of time has been spent worrying and thinking of all the things that could go wrong.  Patient seemed to relate to this and is to practice between sessions trying to interrupt those  negative/depressive/anxious worry cycles.  We will pick up on this next session.  Continues to deny any current suicidal ideation.  Goal review and progress/challenges noted with patient.  Next appointment within 2-3 weeks.   Angel Ace, LCSW

## 2020-04-24 ENCOUNTER — Encounter: Payer: Self-pay | Admitting: Physician Assistant

## 2020-04-24 ENCOUNTER — Ambulatory Visit (INDEPENDENT_AMBULATORY_CARE_PROVIDER_SITE_OTHER): Payer: Medicare PPO | Admitting: Physician Assistant

## 2020-04-24 DIAGNOSIS — T50902D Poisoning by unspecified drugs, medicaments and biological substances, intentional self-harm, subsequent encounter: Secondary | ICD-10-CM

## 2020-04-24 DIAGNOSIS — G4700 Insomnia, unspecified: Secondary | ICD-10-CM | POA: Diagnosis not present

## 2020-04-24 DIAGNOSIS — F411 Generalized anxiety disorder: Secondary | ICD-10-CM | POA: Diagnosis not present

## 2020-04-24 DIAGNOSIS — G35 Multiple sclerosis: Secondary | ICD-10-CM | POA: Diagnosis not present

## 2020-04-24 DIAGNOSIS — F332 Major depressive disorder, recurrent severe without psychotic features: Secondary | ICD-10-CM

## 2020-04-24 NOTE — Progress Notes (Signed)
[facility] Med Check  Patient ID: Angel Maddox,  MRN: [id number]  PCP: Monico Blitz, MD  Date of Evaluation: 04/24/2020 Time spent:45 minutes   HISTORY/CURRENT STATUS: HPI For routine med check. Husband Letitia Libra is with her.   04/13/2020 she OD on an unknown amount of Klonopin and Mirtazepine. "I don't know why I did it. I guess I just decided I was tired of living like this. I don't want to be a burden on anybody." Has MS, which is stable but still has to use a walker and needs help with ADLs. Had COVID w/ bilat pneumonia in July, was on vent for several weeks. "Almost died." She thinks that contributed to her feeling like she's a burden on everyone. Had no suicidal thoughts up to that impulsive decision to take the pills. [name] reports that she was fine that morning when he left for work, and when he came home around lunch time, she was on the floor and he couldn't arouse her.  Was taken to ER via EMS. [name] reports she was in ER holding area for 4 days before going to [facility]. Pt states it was a good place and good experience for her. Mirtazepine and Klonopin were d/c.   Since getting out of the hosp a few days ago, she feels better emotionally. Denies SI "but I wasn't have thoughts before I took the pills either." Biggest problem has been insomnia. She only slept a few hours for several nights b/c she didn't have a sleep aid. And her mind was racing and she couldn't relax. Last night, [name] gave her 1/2 of Klonopin and Mirtazepine, and she feels much better now. Otherwise, she doesn't have access to her pills.   Neuro has been RX'ing the Klonopin. She was only usually taking 1 pill per day. He recommends she get back on Rituxan next month. She's been putting it off, b/c decreased immunity, after covid, that scares her. "And I was fully vaccinated."   Patient denies increased energy with decreased need for sleep, no increased talkativeness, no racing  thoughts, no impulsivity or risky behaviors, no increased spending, no increased libido, no grandiosity, no increased irritability or anger, no paranoia, and no hallucinations.  Started back seeing [name], LCSW yesterday.   Review of Systems  Constitutional: Positive for malaise/fatigue.  HENT: Negative.   Eyes: Negative.   Respiratory: Negative.   Cardiovascular: Negative.   Gastrointestinal: Negative.   Genitourinary: Negative.   Musculoskeletal: Positive for back pain, joint pain, myalgias and neck pain.  Skin: Negative.   Neurological: Positive for tremors, sensory change and weakness.       Has MS  Endo/Heme/Allergies: Negative.   Psychiatric/Behavioral: Positive for depression. The patient has insomnia.   Denies SI/HI   Individual Medical History/ Review of Systems: Changes? :Yes  see HPI.  Past medications for mental health diagnoses include: Depakote, Lamictal, Risperdal, Celexa, Klonopin, trazodone, Wellbutrin, Vistaril, primidone, gabapentin, Prozac, Zyprexa, Ambien, Lunesta, Effexor  Allergies: Hydrocodone  Current Medications:  Current Outpatient Medications:    amLODipine (NORVASC) 10 MG tablet, Take 1 tablet (10 mg total) by mouth daily., Disp: 90 tablet, Rfl: 1   baclofen (LIORESAL) 10 MG tablet, Take 10 mg by mouth 3 (three) times daily., Disp: , Rfl:    benzonatate (TESSALON) 200 MG capsule, Take 1 capsule (200 mg total) by mouth 2 (two) times daily as needed for cough., Disp: 20 capsule, Rfl: 0   Budeson-Glycopyrrol-Formoterol (BREZTRI AEROSPHERE) 160-9-4.8 MCG/ACT AERO, Inhale 2 puffs into the  lungs 2 (two) times daily., Disp: 10.7 g, Rfl: 0   buPROPion (WELLBUTRIN SR) 200 MG 12 hr tablet, Take 1 tablet (200 mg total) by mouth 2 (two) times daily., Disp: 60 tablet, Rfl: 5   citalopram (CELEXA) 40 MG tablet, TAKE 1 TABLET BY MOUTH EVERY DAY, Disp: 90 tablet, Rfl: 2   clonazePAM (KLONOPIN) 1 MG tablet, Take 0.5 mg by mouth 2 (two) times daily as  needed. , Disp: , Rfl:    dalfampridine 10 MG TB12, , Disp: , Rfl:    gabapentin (NEURONTIN) 300 MG capsule, Take by mouth., Disp: , Rfl:    ipratropium-albuterol (DUONEB) 0.5-2.5 (3) MG/3ML SOLN, USE 1 VIAL IN NEBULIZER EVERY 6 HOURS - And as needed, Disp: 120 mL, Rfl: 11   losartan (COZAAR) 25 MG tablet, TAKE 1 TABLET BY MOUTH EVERY DAY, Disp: 90 tablet, Rfl: 1   mirtazapine (REMERON) 15 MG tablet, TAKE 0.5-1 TABLETS (7.5-15 MG TOTAL) BY MOUTH AT BEDTIME AS NEEDED., Disp: 90 tablet, Rfl: 0   Nebulizers (COMPRESSOR/NEBULIZER) MISC, Use as directed, Disp: 1 each, Rfl: 0   omeprazole (PRILOSEC) 40 MG capsule, Take 1 capsule (40 mg total) by mouth daily before breakfast., Disp: 90 capsule, Rfl: 1   ondansetron (ZOFRAN-ODT) 4 MG disintegrating tablet, TAKE 1 TABLET BY MOUTH EVERY 8 HOURS AS NEEDED FOR NAUSEA AND VOMITING, Disp: 30 tablet, Rfl: 2   Oxycodone HCl 10 MG TABS, Take 1 tablet (10 mg total) by mouth every 6 (six) hours as needed ((score 7 to 10))., Disp: 120 tablet, Rfl: 0   primidone (MYSOLINE) 250 MG tablet, Take 125 mg by mouth 3 (three) times daily. , Disp: , Rfl:    PROAIR HFA 108 (90 Base) MCG/ACT inhaler, INHALE 2 PUFFS INTO THE LUNGS EVERY 4 (FOUR) HOURS AS NEEDED FOR WHEEZING OR SHORTNESS OF BREATH., Disp: 8.5 Inhaler, Rfl: 1   sucralfate (CARAFATE) 1 GM/10ML suspension, TAKE 10 MLS (1 G TOTAL) BY MOUTH EVERY 8 (EIGHT) HOURS AS NEEDED., Disp: 420 mL, Rfl: 1   fluticasone furoate-vilanterol (BREO ELLIPTA) 100-25 MCG/INH AEPB, Inhale 1 puff into the lungs daily. (Patient not taking: Reported on 03/25/2020), Disp: 1 each, Rfl: 0   gabapentin (NEURONTIN) 300 MG capsule, Take 2 capsules (600 mg total) by mouth 3 (three) times daily. (Patient taking differently: Take 900 mg by mouth 3 (three) times daily. ), Disp: 180 capsule, Rfl: 0   riTUXimab (RITUXAN) 100 MG/10ML injection, Inject into the vein. (Patient not taking: Reported on 04/24/2020), Disp: , Rfl:    tiZANidine  (ZANAFLEX) 4 MG capsule, , Disp: , Rfl:  Medication Side Effects: none  Family Medical/ Social History: Changes? No  MENTAL HEALTH EXAM:  There were no vitals taken for this visit.There is no height or weight on file to calculate BMI.  General Appearance: Casual, Neat, Well Groomed and very thin  Eye Contact:  Good  Speech:  Clear and Coherent and Normal Rate  Volume:  Normal  Mood:  Euthymic  Affect:  Appropriate  Thought Process:  Goal Directed and Descriptions of Associations: Intact  Orientation:  Full (Time, Place, and Person)  Thought Content: Logical   Suicidal Thoughts:  No  Homicidal Thoughts:  No  Memory:  WNL  Judgement:  Good  Insight:  Good  Psychomotor Activity:  Decreased and uses [place], seems more mobile than LOV. Walks faster and not as difficult for her to get up from sitting position.  Concentration:  Concentration: Good  Recall:  Good  Fund of Knowledge: Good  Language: Good  Assets:  Desire for Improvement  ADLs decreased d/t MS but able to go to kitchen, toilet alone.  Cognition: WNL  Prognosis:  Fair   Most recent pertinent labs 04/13/20 CBC nl CMP nl except NA 134, Calcium 8.4, AST 14, Alk phos 142   DIAGNOSES:    ICD-10-CM   1. Suicide attempt by drug ingestion, subsequent encounter  T50.902D   2. Severe episode of recurrent major depressive disorder, without psychotic features ([place])  F33.2   3. Generalized anxiety disorder  F41.1   4. Insomnia, unspecified type  G47.00   5. MS (multiple sclerosis) ([place])  G35     Receiving Psychotherapy: Yes  [name], LCSW   RECOMMENDATIONS:  PDMP was reviewed. I provided 45 minutes of face to face time during this encounter, including review of labs, ER and BH notes.  Long discussion about the OD, impulsive action which is what many suicide attempts are. In the past, [name] was in control of her meds for awhile. I don't have her old paper chart in front of me, but think she'd been over-taking the  BZ. Knowing [place] as well as I do, I think it's important for her to get good sleep, and as long as [name] is in control of the Mirtazepine and Klonopin, she can still take those meds. [name] agrees to do that. Patient agrees to give him that control.  Currently, her neurologist prescribes the [place]. Javanna might want me to take over that Rx and give only 0.5 mg instead of 62m. She'll let me know whether she wants him to prescribe it or me.  Contract for safety in place. Call the office, 911[facility].  Continue Wellbutrin SR 200 mg, 1 p.o. twice daily. Continue Celexa 40 mg, 1 p.o. daily. Continue Gabapentin 3090m 3 qhs per neuro Continue Klonopin 1 mg, 1/2 po bid prn per neuro.  Continue mirtazapine 15 mg, 1/2-1 nightly as needed. Return in 4 weeks.   TeDonnal MoatPA-C

## 2020-04-25 ENCOUNTER — Other Ambulatory Visit: Payer: Self-pay | Admitting: Physician Assistant

## 2020-04-27 DIAGNOSIS — Z7189 Other specified counseling: Secondary | ICD-10-CM | POA: Diagnosis not present

## 2020-04-27 DIAGNOSIS — F339 Major depressive disorder, recurrent, unspecified: Secondary | ICD-10-CM | POA: Diagnosis not present

## 2020-04-27 DIAGNOSIS — Z1339 Encounter for screening examination for other mental health and behavioral disorders: Secondary | ICD-10-CM | POA: Diagnosis not present

## 2020-04-27 DIAGNOSIS — Z681 Body mass index (BMI) 19 or less, adult: Secondary | ICD-10-CM | POA: Diagnosis not present

## 2020-04-27 DIAGNOSIS — Z299 Encounter for prophylactic measures, unspecified: Secondary | ICD-10-CM | POA: Diagnosis not present

## 2020-04-27 DIAGNOSIS — Z0000 Encounter for general adult medical examination without abnormal findings: Secondary | ICD-10-CM | POA: Diagnosis not present

## 2020-04-27 DIAGNOSIS — Z1331 Encounter for screening for depression: Secondary | ICD-10-CM | POA: Diagnosis not present

## 2020-04-29 DIAGNOSIS — H2512 Age-related nuclear cataract, left eye: Secondary | ICD-10-CM | POA: Diagnosis not present

## 2020-04-29 DIAGNOSIS — H25811 Combined forms of age-related cataract, right eye: Secondary | ICD-10-CM | POA: Diagnosis not present

## 2020-05-04 ENCOUNTER — Ambulatory Visit: Payer: Medicare PPO | Admitting: Internal Medicine

## 2020-05-04 DIAGNOSIS — G4733 Obstructive sleep apnea (adult) (pediatric): Secondary | ICD-10-CM | POA: Diagnosis not present

## 2020-05-04 DIAGNOSIS — R296 Repeated falls: Secondary | ICD-10-CM | POA: Diagnosis not present

## 2020-05-04 DIAGNOSIS — F339 Major depressive disorder, recurrent, unspecified: Secondary | ICD-10-CM | POA: Diagnosis not present

## 2020-05-04 DIAGNOSIS — R52 Pain, unspecified: Secondary | ICD-10-CM | POA: Diagnosis not present

## 2020-05-04 DIAGNOSIS — G35 Multiple sclerosis: Secondary | ICD-10-CM | POA: Diagnosis not present

## 2020-05-04 DIAGNOSIS — D849 Immunodeficiency, unspecified: Secondary | ICD-10-CM | POA: Diagnosis not present

## 2020-05-04 DIAGNOSIS — Z23 Encounter for immunization: Secondary | ICD-10-CM | POA: Diagnosis not present

## 2020-05-05 ENCOUNTER — Other Ambulatory Visit: Payer: Self-pay

## 2020-05-05 ENCOUNTER — Ambulatory Visit (INDEPENDENT_AMBULATORY_CARE_PROVIDER_SITE_OTHER): Payer: Medicare PPO | Admitting: Psychiatry

## 2020-05-05 DIAGNOSIS — I10 Essential (primary) hypertension: Secondary | ICD-10-CM | POA: Diagnosis not present

## 2020-05-05 DIAGNOSIS — J449 Chronic obstructive pulmonary disease, unspecified: Secondary | ICD-10-CM | POA: Diagnosis not present

## 2020-05-05 DIAGNOSIS — Z299 Encounter for prophylactic measures, unspecified: Secondary | ICD-10-CM | POA: Diagnosis not present

## 2020-05-05 DIAGNOSIS — F3341 Major depressive disorder, recurrent, in partial remission: Secondary | ICD-10-CM

## 2020-05-05 DIAGNOSIS — L600 Ingrowing nail: Secondary | ICD-10-CM | POA: Diagnosis not present

## 2020-05-05 DIAGNOSIS — H6121 Impacted cerumen, right ear: Secondary | ICD-10-CM | POA: Diagnosis not present

## 2020-05-05 DIAGNOSIS — Z681 Body mass index (BMI) 19 or less, adult: Secondary | ICD-10-CM | POA: Diagnosis not present

## 2020-05-05 DIAGNOSIS — F339 Major depressive disorder, recurrent, unspecified: Secondary | ICD-10-CM | POA: Diagnosis not present

## 2020-05-05 NOTE — Progress Notes (Signed)
[facility] Counselor/Therapist Progress Note  Patient ID: Angel Maddox, MRN: [id number],    Date: 05/05/2020  Time Spent: 50 minutes   3:00pm to 3:50pm  Treatment Type: Individual Therapy  Reported Symptoms: anxiety, grief, unhappy, angry, depression, Denies any SI.  Mental Status Exam:  Appearance:   Neat     Behavior:  Appropriate and Sharing  Motor:  uses a walker to be mobile  Speech/Language:   Clear and Coherent  Affect:  depressed, anxious, frustrated, angry  Mood:  anxious, depressed and irritable  Thought process:  goal directed  Thought content:    WNL  Sensory/Perceptual disturbances:    WNL  Orientation:  oriented to person, place, time/date, situation, day of week, month of year and year  Attention:  Fair  Concentration:  Poor  Memory:  forgetting and worse under stress  Fund of knowledge:   Good  Insight:    Fair  Judgment:   Good and Fair  Impulse Control:  Good and Fair   Risk Assessment: Danger to Self:  No Self-injurious Behavior: No Danger to Others: No Duty to Warn:no Physical Aggression / Violence:No  Access to Firearms a concern: No  Gang Involvement:No   Subjective: Patient today reports symptoms of:  "anxiety, depression, angry, grief, unhappy" with her physical status with her MS.  Has had COVID which was difficult for her but is better now.     Interventions: Solution-Oriented/Positive Psychology and Ego-Supportive  Diagnosis:   ICD-10-CM   1. Recurrent major depressive disorder, in partial remission ([place])  F33.41      Plan: Patient not signing tx plan updates on computer screen due to [place].   Treatment Goals: Goals may remain on tx plan as patient works on strategies to meet her goals. Progress will be documented each session in "Progress" section.  Long term goal: Develop the ability to recognize, accept, and cope with feelings of depressionand anxiety.  Short term goal: Learn and implement calming skills to  reduce overall tension and moments of increased anxiety and/or depression.Patient will recognize her negative self-talk, interrupt it, and not let it prevent her from trying new strategies to help herself.  Strategy: Teach patient cognitive and somatic calming skills . Rehearse with patient how to apply these skills to her daily life. Review and reinforce success while providing corrective feedback toward consistent implementation.  PROGRESS: Patient in today reporting anxiety, anger, depression, grief, unhappiness, and frustrated.  Had Covid but reports she is better now.  Sleep "is restful but having more dreams since Covid".  Irritable today and expresses anger about her disease and how it has changed her.  Encouraged her expressions of anger, disappointment, grief, and sadness as patient needed a safe, confidential, place to vent her feelings. The more she vented, the less angry she was and ended up more calm and grounded. Also processed some of her grief over her MS and it's worsening after having Covid, which really seemed to help patient re-frame some of her anxious/negative thoughts and be able to replace them with more reality-based, encouraging, and empowering thoughts that do not lead to anxiety nor negativity.  She is to continue working with this thought-stopping/changing strategy between sessions and we will follow up on this next session. At end of session, she still reports no SI and is calm, rational, and smiling some.  Very clear today as she spoke.   Goal review and progress/challenges noted with patient.  Next appointment within 3 weeks.   Neoma Laming  Karene Fry, LCSW

## 2020-05-05 NOTE — Progress Notes (Deleted)
      [facility] Counselor/Therapist Progress Note  Patient ID: Angel Maddox, MRN: [id number],    Date: 05/05/2020  Time Spent: ***   Treatment Type: {CHL AMB THERAPY [phone]}  Reported Symptoms: ***  Mental Status Exam:  Appearance:   {PSY:22683}     Behavior:  {PSY:[id number]}  Motor:  {PSY:22302}  Speech/Language:   {PSY:22685}  Affect:  {PSY:22687}  Mood:  {PSY:31886}  Thought process:  {PSY:31888}  Thought content:    {PSY:[phone]}  Sensory/Perceptual disturbances:    {[phone]}  Orientation:  {PSY:30297}  Attention:  {PSY:22877}  Concentration:  {PSY:458 729 7317}  Memory:  {[phone]}  Fund of knowledge:   {PSY:458 729 7317}  Insight:    {PSY:458 729 7317}  Judgment:   {PSY:458 729 7317}  Impulse Control:  {PSY:458 729 7317}   Risk Assessment: Danger to Self:  {PSY:22692} Self-injurious Behavior: {PSY:22692} Danger to Others: {PSY:22692} Duty to Warn:{PSY:311194} Physical Aggression / Violence:{PSY:21197} Access to Firearms a concern: {PSY:21197} Gang Involvement:{PSY:21197}  Subjective: ***   Interventions: {PSY:[phone]}  Diagnosis:No diagnosis found.  Plan: ***  [name], LCSW

## 2020-05-11 ENCOUNTER — Ambulatory Visit: Payer: Medicare PPO | Admitting: Internal Medicine

## 2020-05-11 ENCOUNTER — Encounter: Payer: Self-pay | Admitting: Internal Medicine

## 2020-05-11 ENCOUNTER — Other Ambulatory Visit: Payer: Self-pay

## 2020-05-11 DIAGNOSIS — U071 COVID-19: Secondary | ICD-10-CM

## 2020-05-11 DIAGNOSIS — G35D Multiple sclerosis, unspecified: Secondary | ICD-10-CM

## 2020-05-11 DIAGNOSIS — G35 Multiple sclerosis: Secondary | ICD-10-CM | POA: Diagnosis not present

## 2020-05-11 DIAGNOSIS — J449 Chronic obstructive pulmonary disease, unspecified: Secondary | ICD-10-CM | POA: Diagnosis not present

## 2020-05-11 NOTE — Assessment & Plan Note (Signed)
CXR residual significantly clearing as of 03/21/20.

## 2020-05-11 NOTE — Assessment & Plan Note (Signed)
Followed by Neurology About to restart Rituxan. Hopefully this will improve her strength and exercise tolerance.

## 2020-05-11 NOTE — Patient Instructions (Signed)
Ok to use your nebulizer machine 2 or 3 times daily  Ok to try the [place] sample once daily in between, while you decide if it helps enough to stay with.  I really hope getting back on your MS meds can make a big difference for you.]  Please call if we can help

## 2020-05-11 NOTE — Progress Notes (Signed)
HPI  64 yo F former smoker followed for asthma/ COPD, dyspnea, complicated by Multiple Sclerosis,  GERD, Chronic constipation,  Osteoporosis, Depression/ Anxiety, Malnutrition, Hyponatremia, abnormal LFTs ECHO 12/20/18- EF 60-65%, WNL PFT 04/29/2019- WNL except DLCO minimally reduced. FV loops show vocal tremor Walk test room air 03/20/20- Starting sat 95%, fell to 92%, Max HR 104. Limited by weakness. ----------------------------------------------------------------------------   03/20/20- 32 yo F former smoker followed for asthma/ COPD, dyspnea, complicated by Multiple Sclerosis,  Anxiety,  GERD/ Gastritis, Chronic constipation,  Osteoporosis, Depression/ Anxiety, Malnutrition, Hyponatremia, abnormal LFTs, Covid infection July, 2021 [facility]  Medication includes  albuterol hfa, Neb Duoneb, clonazepam, gabapentin, welbutrin, citalopram, mirtazapine, provigil, Rituxan Covid vax- 2 Phizer (before coivid infection) Flu vax- her MS doctor adbvised her to wait on this [place]- 7/23-8/13/21- Covid, ARDS, discharged to SNF  copd,DOE, post covid follow up, MS symptoms worse, morning productive cough, clear -----phlegm, wakes at night with dry cough Here now w husband. No O2. Easy DOE, Left leg feels weaker than Right. Dry cough begins as she lies down at night. Aware of some reflux, has Prilosec. Cough productive and nose runs when firsst up and moving in the morning, then not much cough through day. Nebulizer helps- 2-4x/ day. Breo was little help- didn't last through day.  Rituxan was put on hold by MS doctor, pending restart.  Walk test room air 03/20/20- Starting sat 95%, fell to 92%, Max HR 104. Limited by weakness.  05/11/20- 20 yo F former smoker followed for asthma/ COPD, dyspnea, complicated by Multiple Sclerosis,  Anxiety,  GERD/ Gastritis, Chronic constipation,  Osteoporosis, Depression/ Anxiety, Malnutrition, Hyponatremia, abnormal LFTs, Covid infection July, 2021 [facility]  Medication includes   albuterol hfa, Neb Duoneb, Breo 100 or Breztri,  clonazepam, gabapentin, welbutrin, citalopram, mirtazapine, provigil, Rituxan Covid vax- 2 Phizer Flu vax- had -----sob on exertion Finally about to restart Rituxan after long hiatus while dealing with Covid. Has gotten very weak. No specific breathing concerns- no cough or wheeze now. Discussed CXR. Using neb twice daily- does help. Still using up her Breztri samples occ, once per day. Not clear that they or Breo helped much.  CXR 03/21/20-  IMPRESSION: Streaky bilateral lung opacities with significant improvement over the past 2 months, consistent with sequela of prior COVID-19 pneumonia.  ROS-see HPI   + = positive Constitutional:    weight loss, night sweats, fevers, chills, fatigue, lassitude. HEENT:    headaches, difficulty swallowing, tooth/dental problems, sore throat,       sneezing, itching, ear ache, nasal congestion, post nasal drip, snoring CV:    chest pain, orthopnea, PND, swelling in lower extremities, anasarca,                                  dizziness, palpitations Resp:   +shortness of breath with exertion or at rest.                +productive cough,   +non-productive cough, coughing up of blood.              change in color of mucus.  wheezing.   Skin:    rash or lesions. GI:  No-   heartburn, indigestion, +abdominal pain, nausea, vomiting, diarrhea,                 change in bowel habits, loss of appetite GU: dysuria, change in color of urine, no urgency or frequency.  flank pain. MS:   joint pain, stiffness, decreased range of motion, back pain. Neuro-   + MS Psych:  change in mood or affect.  depression or +anxiety.   memory loss.  OBJ- Physical Exam General- Alert, Oriented, Affect-anxious, Distress- none acute, + rolling walker, + slender Skin- rash-none, lesions- none, excoriation- none Lymphadenopathy- none Head- atraumatic            Eyes- Gross vision intact, PERRLA, conjunctivae and secretions clear             Ears- Hearing, canals-normal            Nose- Clear, no-Septal dev, mucus, polyps, erosion, perforation             Throat- Mallampati II , mucosa clear , drainage- none, tonsils- atrophic Neck- flexible , trachea midline, no stridor , thyroid nl, carotid no bruit Chest - symmetrical excursion , unlabored           Heart/CV- RRR , no murmur , no gallop  , no rub, nl s1 s2                           - JVD- none , edema- none, stasis changes- none, varices- none           Lung- clear to P&A, wheeze- none, cough- none , dullness-none, rub- none           Chest wall-  Abd-  Br/ Gen/ Rectal- Not done, not indicated Extrem- + weak  Neuro- grossly intact to observation

## 2020-05-11 NOTE — Assessment & Plan Note (Signed)
Minimal obstructive airways disease does respond to nebulizer. She will decide if Judithann Sauger worth continuing. Most of her DOE reflects profound impact of her MS.

## 2020-05-13 DIAGNOSIS — H25011 Cortical age-related cataract, right eye: Secondary | ICD-10-CM | POA: Diagnosis not present

## 2020-05-13 DIAGNOSIS — H2511 Age-related nuclear cataract, right eye: Secondary | ICD-10-CM | POA: Diagnosis not present

## 2020-05-19 DIAGNOSIS — J449 Chronic obstructive pulmonary disease, unspecified: Secondary | ICD-10-CM | POA: Diagnosis not present

## 2020-05-26 ENCOUNTER — Encounter (HOSPITAL_COMMUNITY)
Admission: RE | Admit: 2020-05-26 | Discharge: 2020-05-26 | Disposition: A | Discharge status: Home / Self Care | Payer: Medicare PPO | Source: Ambulatory Visit | Attending: Psychiatry | Admitting: Psychiatry

## 2020-05-26 ENCOUNTER — Other Ambulatory Visit: Payer: Self-pay

## 2020-05-26 DIAGNOSIS — G35 Multiple sclerosis: Secondary | ICD-10-CM | POA: Diagnosis not present

## 2020-05-26 MED ORDER — ACETAMINOPHEN 500 MG PO TABS
ORAL_TABLET | ORAL | Status: AC
  Filled 2020-05-26: qty 2

## 2020-05-26 MED ORDER — DIPHENHYDRAMINE HCL 50 MG/ML IJ SOLN
25.0000 mg | Freq: Once | INTRAMUSCULAR | Status: AC
  Administered 2020-05-26: 25 mg via INTRAVENOUS

## 2020-05-26 MED ORDER — METHYLPREDNISOLONE SODIUM SUCC 125 MG IJ SOLR
INTRAMUSCULAR | Status: AC
  Filled 2020-05-26: qty 2

## 2020-05-26 MED ORDER — METHYLPREDNISOLONE SODIUM SUCC 125 MG IJ SOLR
125.0000 mg | Freq: Once | INTRAMUSCULAR | Status: AC
  Administered 2020-05-26: 125 mg via INTRAVENOUS

## 2020-05-26 MED ORDER — ACETAMINOPHEN 500 MG PO TABS
1000.0000 mg | ORAL_TABLET | Freq: Once | ORAL | Status: AC
  Administered 2020-05-26: 1000 mg via ORAL

## 2020-05-26 MED ORDER — DIPHENHYDRAMINE HCL 50 MG/ML IJ SOLN
INTRAMUSCULAR | Status: AC
  Filled 2020-05-26: qty 1

## 2020-05-26 MED ORDER — SODIUM CHLORIDE 0.9 % IV SOLN
1000.0000 mg | Freq: Once | INTRAVENOUS | Status: AC
  Administered 2020-05-26: 1000 mg via INTRAVENOUS
  Filled 2020-05-26: qty 100

## 2020-05-28 ENCOUNTER — Telehealth (INDEPENDENT_AMBULATORY_CARE_PROVIDER_SITE_OTHER): Payer: Medicare PPO | Admitting: Physician Assistant

## 2020-05-28 ENCOUNTER — Other Ambulatory Visit: Payer: Self-pay | Admitting: Physician Assistant

## 2020-05-28 ENCOUNTER — Encounter: Payer: Self-pay | Admitting: Physician Assistant

## 2020-05-28 DIAGNOSIS — G4700 Insomnia, unspecified: Secondary | ICD-10-CM

## 2020-05-28 DIAGNOSIS — G35 Multiple sclerosis: Secondary | ICD-10-CM | POA: Diagnosis not present

## 2020-05-28 DIAGNOSIS — F331 Major depressive disorder, recurrent, moderate: Secondary | ICD-10-CM | POA: Diagnosis not present

## 2020-05-28 DIAGNOSIS — F411 Generalized anxiety disorder: Secondary | ICD-10-CM

## 2020-05-28 MED ORDER — MIRTAZAPINE 15 MG PO TABS
7.5000 mg | ORAL_TABLET | Freq: Every evening | ORAL | 5 refills | Status: DC | PRN
Start: 2020-05-28 — End: 2020-08-05

## 2020-05-28 MED ORDER — BUPROPION HCL ER (SR) 200 MG PO TB12: 200.0000 mg | ORAL_TABLET | Freq: Two times a day (BID) | ORAL | 5 refills | Status: DC

## 2020-05-28 NOTE — Progress Notes (Signed)
[facility]  Patient ID: JUDIANN CELIA,  MRN: [id number]  PCP: Monico Blitz, MD  Date of Evaluation: 05/28/2020 Time spent:20 minutes  Virtual Visit via Telehealth  I connected with patient by telephone, with their informed consent, and verified patient privacy and that I am speaking with the correct person using two identifiers.  I am private, in my office and the patient is at home.  I discussed the limitations, risks, security and privacy concerns of performing an evaluation and management service by telephone and the availability of in person appointments. I also discussed with the patient that there may be a patient responsible charge related to this service. The patient expressed understanding and agreed to proceed.   I discussed the assessment and treatment plan with the patient. The patient was provided an opportunity to ask questions and all were answered. The patient agreed with the plan and demonstrated an understanding of the instructions.   The patient was advised to call back or seek an in-person evaluation if the symptoms worsen or if the condition fails to improve as anticipated.  I provided 20 minutes of non-face-to-face time during this encounter.  HISTORY/CURRENT STATUS:  A[place]ill had a Rituxan treatment 2 days ago, for the first time after she had COVID over the summer.  She had respiratory failure and septic shock and was on ventilator for 4 days, "I almost died."  Her neurologist had withheld the Rituxan while she was healing from [place] but felt that the multiple sclerosis needed to be treated with this medication again.  States when she took it before she had COVID, she tolerated it fine but this time she feels miserable.  "It is like there is a battle going on in my body.  I hurt all in the inside.  I could not even walk yesterday."  She has a call into her neurologist to get further direction.  "If it is going to be like this every time, I may not take  it anymore."  As far as her mental health goes, she is doing well.  "I was really good before Tuesday when I had the treatment.  My mood is a little low right now but it is because I feel so bad."  Due to the MS, she is not able to enjoy a lot of things but states she is not sad all the time.  States she does get sad if she looks at pictures of [name] this time of year so she does not look at them.  He died nearly 6 years ago.  Her energy and motivation are low but again more due to [place] than her mental state.  She needs help with some ADLs.  She sleeps pretty good.  Her husband Letitia Libra is in control of her medications.  She does not want to have anything to do with him because even though she did not feel suicidal, she did overtake her medication requiring hospitalization in October.  She denies suicidal or homicidal thoughts.  Anxiety is controlled with Klonopin.  Her neurologist continues to prescribe that.  Patient denies increased energy with decreased need for sleep, no increased talkativeness, no racing thoughts, no impulsivity or risky behaviors, no increased spending, no increased libido, no grandiosity, no increased irritability or anger, no paranoia, and no hallucinations.  No change in musculoskeletal or neurologic review of systems.  Individual Medical History/ Review of Systems: Changes? :Yes  see HPI.  Past medications for mental health diagnoses include: Depakote, Lamictal,  Risperdal, Celexa, Klonopin, trazodone, Wellbutrin, Vistaril, primidone, gabapentin, Prozac, Zyprexa, Ambien, Lunesta, Effexor  Allergies: Hydrocodone  Current Medications:  Current Outpatient Medications:  .  amLODipine (NORVASC) 10 MG tablet, Take 1 tablet (10 mg total) by mouth daily., Disp: 90 tablet, Rfl: 1 .  baclofen (LIORESAL) 10 MG tablet, Take 10 mg by mouth 3 (three) times daily., Disp: , Rfl:  .  benzonatate (TESSALON) 200 MG capsule, Take 1 capsule (200 mg total) by mouth 2 (two) times daily as  needed for cough., Disp: 20 capsule, Rfl: 0 .  Budeson-Glycopyrrol-Formoterol (BREZTRI AEROSPHERE) 160-9-4.8 MCG/ACT AERO, Inhale 2 puffs into the lungs 2 (two) times daily., Disp: 10.7 g, Rfl: 0 .  buPROPion (WELLBUTRIN SR) 200 MG 12 hr tablet, Take 1 tablet (200 mg total) by mouth 2 (two) times daily., Disp: 60 tablet, Rfl: 5 .  citalopram (CELEXA) 40 MG tablet, TAKE 1 TABLET BY MOUTH EVERY DAY, Disp: 90 tablet, Rfl: 2 .  clonazePAM (KLONOPIN) 0.5 MG tablet, , Disp: , Rfl:  .  dalfampridine 10 MG TB12, , Disp: , Rfl:  .  gabapentin (NEURONTIN) 300 MG capsule, Take by mouth., Disp: , Rfl:  .  ipratropium-albuterol (DUONEB) 0.5-2.5 (3) MG/3ML SOLN, USE 1 VIAL IN NEBULIZER EVERY 6 HOURS - And as needed, Disp: 120 mL, Rfl: 11 .  losartan (COZAAR) 25 MG tablet, TAKE 1 TABLET BY MOUTH EVERY DAY, Disp: 90 tablet, Rfl: 1 .  mirtazapine (REMERON) 15 MG tablet, Take 0.5-1 tablets (7.5-15 mg total) by mouth at bedtime as needed., Disp: 30 tablet, Rfl: 5 .  Nebulizers (COMPRESSOR/NEBULIZER) MISC, Use as directed, Disp: 1 each, Rfl: 0 .  omeprazole (PRILOSEC) 40 MG capsule, Take 1 capsule (40 mg total) by mouth daily before breakfast., Disp: 90 capsule, Rfl: 1 .  ondansetron (ZOFRAN-ODT) 4 MG disintegrating tablet, TAKE 1 TABLET BY MOUTH EVERY 8 HOURS AS NEEDED FOR NAUSEA AND VOMITING, Disp: 30 tablet, Rfl: 2 .  Oxycodone HCl 10 MG TABS, Take 1 tablet (10 mg total) by mouth every 6 (six) hours as needed ((score 7 to 10))., Disp: 120 tablet, Rfl: 0 .  primidone (MYSOLINE) 250 MG tablet, Take 125 mg by mouth 3 (three) times daily. , Disp: , Rfl:  .  PROAIR HFA 108 (90 Base) MCG/ACT inhaler, INHALE 2 PUFFS INTO THE LUNGS EVERY 4 (FOUR) HOURS AS NEEDED FOR WHEEZING OR SHORTNESS OF BREATH., Disp: 8.5 Inhaler, Rfl: 1 .  riTUXimab (RITUXAN) 100 MG/10ML injection, Inject into the vein. , Disp: , Rfl:  .  sucralfate (CARAFATE) 1 GM/10ML suspension, TAKE 10 MLS (1 G TOTAL) BY MOUTH EVERY 8 (EIGHT) HOURS AS NEEDED.,  Disp: 420 mL, Rfl: 1 .  tiZANidine (ZANAFLEX) 4 MG capsule, , Disp: , Rfl:  .  clonazePAM (KLONOPIN) 1 MG tablet, Take 0.5 mg by mouth 2 (two) times daily as needed.  (Patient not taking: Reported on 05/28/2020), Disp: , Rfl:  .  gabapentin (NEURONTIN) 300 MG capsule, Take 2 capsules (600 mg total) by mouth 3 (three) times daily. (Patient taking differently: Take 900 mg by mouth 3 (three) times daily. ), Disp: 180 capsule, Rfl: 0 Medication Side Effects: none  Family Medical/ Social History: Changes? No  MENTAL HEALTH EXAM:  There were no vitals taken for this visit.There is no height or weight on file to calculate BMI.  General Appearance: Unable to assess  Eye Contact:  Unable to assess  Speech:  Clear and Coherent and Normal Rate  Volume:  Normal  Mood:  Euthymic  Affect:  Unable to assess  Thought Process:  Goal Directed and Descriptions of Associations: Intact  Orientation:  Full (Time, Place, and Person)  Thought Content: Logical   Suicidal Thoughts:  No  Homicidal Thoughts:  No  Memory:  WNL  Judgement:  Good  Insight:  Good  Psychomotor Activity:  Unable to assess  Concentration:  Concentration: Good  Recall:  Good  Fund of Knowledge: Good  Language: Good  Assets:  Desire for Improvement  ADLs decreased d/t MS but able to go to kitchen, toilet alone.  Cognition: WNL  Prognosis:  Fair   Most recent pertinent labs 04/13/20 CBC nl CMP nl except NA 134, Calcium 8.4, AST 14, Alk phos 142   DIAGNOSES:    ICD-10-CM   1. Major depressive disorder, recurrent episode, moderate (HCC)  F33.1   2. Generalized anxiety disorder  F41.1   3. Insomnia, unspecified type  G47.00   4. MS (multiple sclerosis) ([place])  G35     Receiving Psychotherapy: Yes  [name], LCSW   RECOMMENDATIONS:  PDMP was reviewed. I provided 20 minutes of nonface-to-face time during this encounter. I hope she feels better soon from the side effects of the Rituxan.  I am glad she is doing well  mentally. Per patient request and due to history of overdose, only give a 30-day supply, vs 90 days, at a time when refills are due. Continue Wellbutrin SR 200 mg, 1 p.o. twice daily. Continue Celexa 40 mg, 1 p.o. daily. Continue Gabapentin 336m, 3 qhs per neuro Continue Klonopin 0.5 mg, bid prn per neuro.  Continue mirtazapine 15 mg, 1/2-1 nightly as needed. Return in 6 weeks.  TDonnal Moat PA-C

## 2020-06-03 ENCOUNTER — Ambulatory Visit: Payer: Medicare PPO | Admitting: Psychiatry

## 2020-06-05 DIAGNOSIS — R64 Cachexia: Secondary | ICD-10-CM | POA: Diagnosis not present

## 2020-06-05 DIAGNOSIS — R531 Weakness: Secondary | ICD-10-CM | POA: Diagnosis not present

## 2020-06-05 DIAGNOSIS — C799 Secondary malignant neoplasm of unspecified site: Secondary | ICD-10-CM | POA: Diagnosis not present

## 2020-06-05 DIAGNOSIS — Z515 Encounter for palliative care: Secondary | ICD-10-CM | POA: Diagnosis not present

## 2020-06-09 ENCOUNTER — Ambulatory Visit: Payer: Medicare PPO | Admitting: Internal Medicine

## 2020-06-09 ENCOUNTER — Inpatient Hospital Stay (HOSPITAL_COMMUNITY): Admission: RE | Admit: 2020-06-09 | Payer: Medicare PPO | Source: Ambulatory Visit

## 2020-06-16 ENCOUNTER — Encounter (HOSPITAL_COMMUNITY): Payer: Medicare PPO

## 2020-06-16 DIAGNOSIS — E2839 Other primary ovarian failure: Secondary | ICD-10-CM | POA: Diagnosis not present

## 2020-06-24 ENCOUNTER — Ambulatory Visit: Payer: Medicare PPO | Admitting: Psychiatry

## 2020-07-01 ENCOUNTER — Encounter: Payer: Self-pay | Admitting: Physician Assistant

## 2020-07-01 ENCOUNTER — Telehealth (INDEPENDENT_AMBULATORY_CARE_PROVIDER_SITE_OTHER): Payer: Medicare PPO | Admitting: Physician Assistant

## 2020-07-01 DIAGNOSIS — F3341 Major depressive disorder, recurrent, in partial remission: Secondary | ICD-10-CM

## 2020-07-01 DIAGNOSIS — G35 Multiple sclerosis: Secondary | ICD-10-CM | POA: Diagnosis not present

## 2020-07-01 DIAGNOSIS — F411 Generalized anxiety disorder: Secondary | ICD-10-CM | POA: Diagnosis not present

## 2020-07-01 DIAGNOSIS — G4700 Insomnia, unspecified: Secondary | ICD-10-CM

## 2020-07-01 NOTE — Progress Notes (Signed)
[facility]  Patient ID: Angel Maddox,  MRN: [id number]  PCP: Monico Blitz, MD  Date of Evaluation: 07/01/2020 Time spent:25 minutes  Virtual Visit via Telehealth  I connected with patient by telephone, with their informed consent, and verified patient privacy and that I am speaking with the correct person using two identifiers.  I am private, in my office and the patient is at home.  I discussed the limitations, risks, security and privacy concerns of performing an evaluation and management service by telephone and the availability of in person appointments. I also discussed with the patient that there may be a patient responsible charge related to this service. The patient expressed understanding and agreed to proceed.   I discussed the assessment and treatment plan with the patient. The patient was provided an opportunity to ask questions and all were answered. The patient agreed with the plan and demonstrated an understanding of the instructions.   The patient was advised to call back or seek an in-person evaluation if the symptoms worsen or if the condition fails to improve as anticipated.  I provided 25 minutes of non-face-to-face time during this encounter.  HISTORY/CURRENT STATUS:  Doing really well.  Had a very good Christmas with her family and grandkids.  Over the past few weeks she has drained about her son Angel Maddox on 2 different occasions.  It has been almost 6 years since he died.  In the dreams, he was there taking care of of his son and also letting Angel Maddox know that he was okay.  "I woke up feeling so good, knowing that he is all right."  She took Rituxan treatment a few months ago and had terrible side effects from it.  States the multiple sclerosis symptoms worsened and she decided not to take any more of that.  The MS is stable.  She is able to get around with a walker.  Has had no suicidal ideations.  Her husband continues to be in control of her  medications.  She does not have access to the Klonopin.  States again she is not sure why she overdosed this fall except for the fact that she did not want to be a burden to her husband.  She now understands and feels like her family would be really her if she was not here and they want to help her.  She is able to enjoy things as much as she can.  Energy and motivation are good.  She is not isolating.  Her appetite is good.  No weight loss.  She does cry easily with triggers that would affect anyone else in the same way she reports. Sleeps well. Mirtazapine has really helped. No SI/HI.   Patient denies increased energy with decreased need for sleep, no increased talkativeness, no racing thoughts, no impulsivity or risky behaviors, no increased spending, no increased libido, no grandiosity, no increased irritability or anger, no paranoia, and no hallucinations.  No change in musculoskeletal or neurologic review of systems.  Individual Medical History/ Review of Systems: Changes? :No    Past medications for mental health diagnoses include: Depakote, Lamictal, Risperdal, Celexa, Klonopin, trazodone, Wellbutrin, Vistaril, primidone, gabapentin, Prozac, Zyprexa, Ambien, Lunesta, Effexor  Allergies: Hydrocodone and Rituxan [rituximab]  Current Medications:  Current Outpatient Medications:  .  amLODipine (NORVASC) 10 MG tablet, Take 1 tablet (10 mg total) by mouth daily., Disp: 90 tablet, Rfl: 1 .  baclofen (LIORESAL) 10 MG tablet, Take 10 mg by mouth 3 (three) times daily., Disp: ,  Rfl:  .  benzonatate (TESSALON) 200 MG capsule, Take 1 capsule (200 mg total) by mouth 2 (two) times daily as needed for cough., Disp: 20 capsule, Rfl: 0 .  Budeson-Glycopyrrol-Formoterol (BREZTRI AEROSPHERE) 160-9-4.8 MCG/ACT AERO, Inhale 2 puffs into the lungs 2 (two) times daily., Disp: 10.7 g, Rfl: 0 .  buPROPion (WELLBUTRIN SR) 200 MG 12 hr tablet, Take 1 tablet (200 mg total) by mouth 2 (two) times daily., Disp: 60  tablet, Rfl: 5 .  citalopram (CELEXA) 40 MG tablet, TAKE 1 TABLET BY MOUTH EVERY DAY, Disp: 90 tablet, Rfl: 2 .  clonazePAM (KLONOPIN) 0.5 MG tablet, , Disp: , Rfl:  .  gabapentin (NEURONTIN) 300 MG capsule, Take by mouth., Disp: , Rfl:  .  ipratropium-albuterol (DUONEB) 0.5-2.5 (3) MG/3ML SOLN, USE 1 VIAL IN NEBULIZER EVERY 6 HOURS - And as needed, Disp: 120 mL, Rfl: 11 .  losartan (COZAAR) 25 MG tablet, TAKE 1 TABLET BY MOUTH EVERY DAY, Disp: 90 tablet, Rfl: 1 .  mirtazapine (REMERON) 15 MG tablet, Take 0.5-1 tablets (7.5-15 mg total) by mouth at bedtime as needed., Disp: 30 tablet, Rfl: 5 .  Nebulizers (COMPRESSOR/NEBULIZER) MISC, Use as directed, Disp: 1 each, Rfl: 0 .  omeprazole (PRILOSEC) 40 MG capsule, Take 1 capsule (40 mg total) by mouth daily before breakfast., Disp: 90 capsule, Rfl: 1 .  ondansetron (ZOFRAN-ODT) 4 MG disintegrating tablet, TAKE 1 TABLET BY MOUTH EVERY 8 HOURS AS NEEDED FOR NAUSEA AND VOMITING, Disp: 30 tablet, Rfl: 2 .  primidone (MYSOLINE) 250 MG tablet, Take 125 mg by mouth 3 (three) times daily. , Disp: , Rfl:  .  PROAIR HFA 108 (90 Base) MCG/ACT inhaler, INHALE 2 PUFFS INTO THE LUNGS EVERY 4 (FOUR) HOURS AS NEEDED FOR WHEEZING OR SHORTNESS OF BREATH., Disp: 8.5 Inhaler, Rfl: 1 .  sucralfate (CARAFATE) 1 GM/10ML suspension, TAKE 10 MLS (1 G TOTAL) BY MOUTH EVERY 8 (EIGHT) HOURS AS NEEDED., Disp: 420 mL, Rfl: 1 .  tiZANidine (ZANAFLEX) 4 MG capsule, , Disp: , Rfl:  .  dalfampridine 10 MG TB12, , Disp: , Rfl:  .  gabapentin (NEURONTIN) 300 MG capsule, Take 2 capsules (600 mg total) by mouth 3 (three) times daily. (Patient taking differently: Take 900 mg by mouth 3 (three) times daily. ), Disp: 180 capsule, Rfl: 0 .  Oxycodone HCl 10 MG TABS, Take 1 tablet (10 mg total) by mouth every 6 (six) hours as needed ((score 7 to 10))., Disp: 120 tablet, Rfl: 0 .  riTUXimab (RITUXAN) 100 MG/10ML injection, Inject into the vein.  (Patient not taking: Reported on 07/01/2020), Disp: ,  Rfl:  Medication Side Effects: none  Family Medical/ Social History: Changes? No  MENTAL HEALTH EXAM:  There were no vitals taken for this visit.There is no height or weight on file to calculate BMI.  General Appearance: Unable to assess  Eye Contact:  Unable to assess  Speech:  Clear and Coherent and Normal Rate  Volume:  Normal  Mood:  Euthymic  Affect:  Unable to assess  Thought Process:  Goal Directed and Descriptions of Associations: Intact  Orientation:  Full (Time, Place, and Person)  Thought Content: Logical   Suicidal Thoughts:  No  Homicidal Thoughts:  No  Memory:  WNL  Judgement:  Good  Insight:  Good  Psychomotor Activity:  Unable to assess  Concentration:  Concentration: Good  Recall:  Good  Fund of Knowledge: Good  Language: Good  Assets:  Desire for Improvement  ADLs decreased d/t MS  but able to go to kitchen, toilet alone.  Cognition: WNL  Prognosis:  Fair   Most recent pertinent labs: 04/13/20 CBC nl CMP nl except NA 134, Calcium 8.4, AST 14, Alk phos 142   DIAGNOSES:    ICD-10-CM   1. Recurrent major depressive disorder, in partial remission ([place])  F33.41   2. Generalized anxiety disorder  F41.1   3. Insomnia, unspecified type  G47.00   4. MS (multiple sclerosis) ([place])  G35     Receiving Psychotherapy: Yes  [name], LCSW   RECOMMENDATIONS:  PDMP was reviewed. I provided 25 minutes of nonface-to-face time during this encounter, discussing her diagnosis and treatment plan.  I am glad to see her doing well as far as mental and emotional health go.  Even with the physical challenges that she has from [place], she does her best to stay active. No changes will be made in her medications. Per patient request and due to history of overdose, only give a 30-day supply, vs 90 days, at a time when refills are due. Continue Wellbutrin SR 200 mg, 1 p.o. twice daily. Continue Celexa 40 mg, 1 p.o. daily. Continue Gabapentin 377m, 3 qhs per neuro Continue  Klonopin 0.5 mg, bid prn per neuro.  Continue mirtazapine 15 mg, 1/2-1 nightly as needed. Return in 2-3  months.   TDonnal Moat PA-C

## 2020-07-08 DIAGNOSIS — Z1231 Encounter for screening mammogram for malignant neoplasm of breast: Secondary | ICD-10-CM | POA: Diagnosis not present

## 2020-07-08 DIAGNOSIS — Z01419 Encounter for gynecological examination (general) (routine) without abnormal findings: Secondary | ICD-10-CM | POA: Diagnosis not present

## 2020-07-08 DIAGNOSIS — Z681 Body mass index (BMI) 19 or less, adult: Secondary | ICD-10-CM | POA: Diagnosis not present

## 2020-07-13 ENCOUNTER — Ambulatory Visit: Payer: Medicare PPO | Admitting: Physician Assistant

## 2020-07-21 DIAGNOSIS — I10 Essential (primary) hypertension: Secondary | ICD-10-CM | POA: Diagnosis not present

## 2020-07-21 DIAGNOSIS — Z23 Encounter for immunization: Secondary | ICD-10-CM | POA: Diagnosis not present

## 2020-07-21 DIAGNOSIS — R5383 Other fatigue: Secondary | ICD-10-CM | POA: Diagnosis not present

## 2020-07-21 DIAGNOSIS — E559 Vitamin D deficiency, unspecified: Secondary | ICD-10-CM | POA: Diagnosis not present

## 2020-07-21 DIAGNOSIS — Z79899 Other long term (current) drug therapy: Secondary | ICD-10-CM | POA: Diagnosis not present

## 2020-08-04 ENCOUNTER — Other Ambulatory Visit: Payer: Self-pay | Admitting: Physician Assistant

## 2020-08-05 DIAGNOSIS — F339 Major depressive disorder, recurrent, unspecified: Secondary | ICD-10-CM | POA: Diagnosis not present

## 2020-08-05 DIAGNOSIS — G4710 Hypersomnia, unspecified: Secondary | ICD-10-CM | POA: Diagnosis not present

## 2020-08-05 DIAGNOSIS — J449 Chronic obstructive pulmonary disease, unspecified: Secondary | ICD-10-CM | POA: Diagnosis not present

## 2020-08-05 DIAGNOSIS — G35 Multiple sclerosis: Secondary | ICD-10-CM | POA: Diagnosis not present

## 2020-08-05 DIAGNOSIS — D72819 Decreased white blood cell count, unspecified: Secondary | ICD-10-CM | POA: Diagnosis not present

## 2020-08-05 DIAGNOSIS — D849 Immunodeficiency, unspecified: Secondary | ICD-10-CM | POA: Diagnosis not present

## 2020-08-05 DIAGNOSIS — R2689 Other abnormalities of gait and mobility: Secondary | ICD-10-CM | POA: Diagnosis not present

## 2020-08-05 DIAGNOSIS — R52 Pain, unspecified: Secondary | ICD-10-CM | POA: Diagnosis not present

## 2020-08-18 DIAGNOSIS — F319 Bipolar disorder, unspecified: Secondary | ICD-10-CM | POA: Diagnosis not present

## 2020-08-18 DIAGNOSIS — Z87891 Personal history of nicotine dependence: Secondary | ICD-10-CM | POA: Diagnosis not present

## 2020-08-18 DIAGNOSIS — Z681 Body mass index (BMI) 19 or less, adult: Secondary | ICD-10-CM | POA: Diagnosis not present

## 2020-08-18 DIAGNOSIS — Z299 Encounter for prophylactic measures, unspecified: Secondary | ICD-10-CM | POA: Diagnosis not present

## 2020-08-18 DIAGNOSIS — J019 Acute sinusitis, unspecified: Secondary | ICD-10-CM | POA: Diagnosis not present

## 2020-08-18 DIAGNOSIS — J449 Chronic obstructive pulmonary disease, unspecified: Secondary | ICD-10-CM | POA: Diagnosis not present

## 2020-08-25 DIAGNOSIS — Z961 Presence of intraocular lens: Secondary | ICD-10-CM | POA: Diagnosis not present

## 2020-08-25 DIAGNOSIS — H26493 Other secondary cataract, bilateral: Secondary | ICD-10-CM | POA: Diagnosis not present

## 2020-08-26 ENCOUNTER — Other Ambulatory Visit: Payer: Self-pay

## 2020-08-26 ENCOUNTER — Encounter: Payer: Self-pay | Admitting: Physician Assistant

## 2020-08-26 ENCOUNTER — Ambulatory Visit (INDEPENDENT_AMBULATORY_CARE_PROVIDER_SITE_OTHER): Payer: Medicare PPO | Admitting: Physician Assistant

## 2020-08-26 DIAGNOSIS — G35 Multiple sclerosis: Secondary | ICD-10-CM

## 2020-08-26 DIAGNOSIS — G4700 Insomnia, unspecified: Secondary | ICD-10-CM | POA: Diagnosis not present

## 2020-08-26 DIAGNOSIS — F3341 Major depressive disorder, recurrent, in partial remission: Secondary | ICD-10-CM

## 2020-08-26 DIAGNOSIS — F411 Generalized anxiety disorder: Secondary | ICD-10-CM

## 2020-08-26 MED ORDER — MIRTAZAPINE 15 MG PO TABS
7.5000 mg | ORAL_TABLET | Freq: Every evening | ORAL | 11 refills | Status: DC | PRN
Start: 2020-08-26 — End: 2021-08-19

## 2020-08-26 MED ORDER — BUPROPION HCL ER (SR) 200 MG PO TB12: 200.0000 mg | ORAL_TABLET | Freq: Two times a day (BID) | ORAL | 11 refills | Status: DC

## 2020-08-26 MED ORDER — CITALOPRAM HYDROBROMIDE 40 MG PO TABS: 40.0000 mg | ORAL_TABLET | Freq: Every day | ORAL | 11 refills | Status: DC

## 2020-08-26 NOTE — Progress Notes (Signed)
[facility]  Patient ID: Angel Maddox,  MRN: [id number]  PCP: Monico Blitz, MD  Date of Evaluation: 08/26/2020 Time spent:20 minutes   HISTORY/CURRENT STATUS:  Doing well. Mood is good.  No sx of depression.  Is able to enjoy things.  Energy and motivation are good for her.  As far as the MS goes, she is doing better.  Able to walk well with her Rollator walker.  States she is feeling better than she has in a long time.  Not isolating.  No suicidal or homicidal thoughts.  She sleeps well.  Anxiety is well controlled with her medication.  Her husband is still in charge of her medicines, she does not even know where he has them hidden.  Patient denies increased energy with decreased need for sleep, no increased talkativeness, no racing thoughts, no impulsivity or risky behaviors, no increased spending, no increased libido, no grandiosity, no increased irritability or anger, no paranoia, and no hallucinations.  No change in musculoskeletal or neurologic review of systems. Walking better.  Individual Medical History/ Review of Systems: Changes? :Yes    Past medications for mental health diagnoses include: Depakote, Lamictal, Risperdal, Celexa, Klonopin, trazodone, Wellbutrin, Vistaril, primidone, gabapentin, Prozac, Zyprexa, Ambien, Lunesta, Effexor  Allergies: Hydrocodone and Rituxan [rituximab]  Current Medications:  Current Outpatient Medications:  .  amLODipine (NORVASC) 10 MG tablet, Take 1 tablet (10 mg total) by mouth daily., Disp: 90 tablet, Rfl: 1 .  baclofen (LIORESAL) 10 MG tablet, Take 10 mg by mouth 3 (three) times daily., Disp: , Rfl:  .  benzonatate (TESSALON) 200 MG capsule, Take 1 capsule (200 mg total) by mouth 2 (two) times daily as needed for cough., Disp: 20 capsule, Rfl: 0 .  Budeson-Glycopyrrol-Formoterol (BREZTRI AEROSPHERE) 160-9-4.8 MCG/ACT AERO, Inhale 2 puffs into the lungs 2 (two) times daily., Disp: 10.7 g, Rfl: 0 .  clonazePAM (KLONOPIN) 0.5  MG tablet, , Disp: , Rfl:  .  dalfampridine 10 MG TB12, , Disp: , Rfl:  .  gabapentin (NEURONTIN) 300 MG capsule, Take 600 mg by mouth 3 (three) times daily., Disp: , Rfl:  .  ipratropium-albuterol (DUONEB) 0.5-2.5 (3) MG/3ML SOLN, USE 1 VIAL IN NEBULIZER EVERY 6 HOURS - And as needed, Disp: 120 mL, Rfl: 11 .  losartan (COZAAR) 25 MG tablet, TAKE 1 TABLET BY MOUTH EVERY DAY, Disp: 90 tablet, Rfl: 1 .  Nebulizers (COMPRESSOR/NEBULIZER) MISC, Use as directed, Disp: 1 each, Rfl: 0 .  omeprazole (PRILOSEC) 40 MG capsule, Take 1 capsule (40 mg total) by mouth daily before breakfast., Disp: 90 capsule, Rfl: 1 .  Oxycodone HCl 10 MG TABS, Take 1 tablet (10 mg total) by mouth every 6 (six) hours as needed ((score 7 to 10))., Disp: 120 tablet, Rfl: 0 .  primidone (MYSOLINE) 250 MG tablet, Take 125 mg by mouth 3 (three) times daily. , Disp: , Rfl:  .  tiZANidine (ZANAFLEX) 4 MG capsule, , Disp: , Rfl:  .  buPROPion (WELLBUTRIN SR) 200 MG 12 hr tablet, Take 1 tablet (200 mg total) by mouth 2 (two) times daily., Disp: 60 tablet, Rfl: 11 .  citalopram (CELEXA) 40 MG tablet, Take 1 tablet (40 mg total) by mouth daily., Disp: 30 tablet, Rfl: 11 .  gabapentin (NEURONTIN) 300 MG capsule, Take 2 capsules (600 mg total) by mouth 3 (three) times daily. (Patient taking differently: Take 900 mg by mouth 3 (three) times daily. ), Disp: 180 capsule, Rfl: 0 .  mirtazapine (REMERON) 15 MG tablet, Take 0.5-1  tablets (7.5-15 mg total) by mouth at bedtime as needed., Disp: 30 tablet, Rfl: 11 .  ondansetron (ZOFRAN-ODT) 4 MG disintegrating tablet, TAKE 1 TABLET BY MOUTH EVERY 8 HOURS AS NEEDED FOR NAUSEA AND VOMITING (Patient not taking: Reported on 08/26/2020), Disp: 30 tablet, Rfl: 2 .  PROAIR HFA 108 (90 Base) MCG/ACT inhaler, INHALE 2 PUFFS INTO THE LUNGS EVERY 4 (FOUR) HOURS AS NEEDED FOR WHEEZING OR SHORTNESS OF BREATH. (Patient not taking: Reported on 08/26/2020), Disp: 8.5 Inhaler, Rfl: 1 .  riTUXimab (RITUXAN) 100 MG/10ML  injection, Inject into the vein.  (Patient not taking: No sig reported), Disp: , Rfl:  Medication Side Effects: none  Family Medical/ Social History: Changes? No  MENTAL HEALTH EXAM:  There were no vitals taken for this visit.There is no height or weight on file to calculate BMI.  General Appearance: Casual, Neat and Well Groomed  Eye Contact:  Good  Speech:  Clear and Coherent and Normal Rate  Volume:  Normal  Mood:  Euthymic  Affect:  Appropriate  Thought Process:  Goal Directed and Descriptions of Associations: Intact  Orientation:  Full (Time, Place, and Person)  Thought Content: Logical   Suicidal Thoughts:  No  Homicidal Thoughts:  No  Memory:  WNL  Judgement:  Good  Insight:  Good  Psychomotor Activity:  uses rolader walker, walks normal speed and looks great!  Concentration:  Concentration: Good  Recall:  Good  Fund of Knowledge: Good  Language: Good  Assets:  Desire for Improvement  ADLs decreased d/t MS but able to go to kitchen, toilet alone.  Cognition: WNL  Prognosis:  Fair   Most recent pertinent labs: 04/13/20 CBC nl CMP nl except NA 134, Calcium 8.4, AST 14, Alk phos 142  DIAGNOSES:    ICD-10-CM   1. Recurrent major depressive disorder, in partial remission ([place])  F33.41   2. Generalized anxiety disorder  F41.1   3. Insomnia, unspecified type  G47.00   4. MS (multiple sclerosis) ([place])  G35     Receiving Psychotherapy: Yes  [name], LCSW   RECOMMENDATIONS:  PDMP was reviewed. I provided 20 minutes of face-to-face time during this encounter, including time spent reviewing records I am glad to see her doing so well!  No changes in meds need to be made. Per patient request and due to history of overdose, only give a 30-day supply, vs 90 days, at a time when refills are due. Continue Wellbutrin SR 200 mg, 1 p.o. twice daily. Continue Celexa 40 mg, 1 p.o. daily. Continue Klonopin 0.5 mg as directed per neurology. Continue mirtazapine 15 mg, 1/2-1  p.o. nightly as needed sleep. Return in 3 months.  Donnal Moat, PA-C

## 2020-09-07 DIAGNOSIS — J019 Acute sinusitis, unspecified: Secondary | ICD-10-CM | POA: Diagnosis not present

## 2020-09-07 DIAGNOSIS — Z299 Encounter for prophylactic measures, unspecified: Secondary | ICD-10-CM | POA: Diagnosis not present

## 2020-09-07 DIAGNOSIS — F32A Depression, unspecified: Secondary | ICD-10-CM | POA: Diagnosis not present

## 2020-09-07 DIAGNOSIS — R059 Cough, unspecified: Secondary | ICD-10-CM | POA: Diagnosis not present

## 2020-09-07 DIAGNOSIS — K219 Gastro-esophageal reflux disease without esophagitis: Secondary | ICD-10-CM | POA: Diagnosis not present

## 2020-09-07 DIAGNOSIS — Z681 Body mass index (BMI) 19 or less, adult: Secondary | ICD-10-CM | POA: Diagnosis not present

## 2020-09-07 DIAGNOSIS — Z87891 Personal history of nicotine dependence: Secondary | ICD-10-CM | POA: Diagnosis not present

## 2020-09-09 DIAGNOSIS — H26492 Other secondary cataract, left eye: Secondary | ICD-10-CM | POA: Diagnosis not present

## 2020-09-16 DIAGNOSIS — H26491 Other secondary cataract, right eye: Secondary | ICD-10-CM | POA: Diagnosis not present

## 2020-09-21 DIAGNOSIS — D72819 Decreased white blood cell count, unspecified: Secondary | ICD-10-CM | POA: Diagnosis not present

## 2020-09-21 DIAGNOSIS — R2689 Other abnormalities of gait and mobility: Secondary | ICD-10-CM | POA: Diagnosis not present

## 2020-09-21 DIAGNOSIS — D849 Immunodeficiency, unspecified: Secondary | ICD-10-CM | POA: Diagnosis not present

## 2020-09-21 DIAGNOSIS — F339 Major depressive disorder, recurrent, unspecified: Secondary | ICD-10-CM | POA: Diagnosis not present

## 2020-09-21 DIAGNOSIS — R52 Pain, unspecified: Secondary | ICD-10-CM | POA: Diagnosis not present

## 2020-09-21 DIAGNOSIS — R296 Repeated falls: Secondary | ICD-10-CM | POA: Diagnosis not present

## 2020-09-21 DIAGNOSIS — G35 Multiple sclerosis: Secondary | ICD-10-CM | POA: Diagnosis not present

## 2020-09-27 ENCOUNTER — Other Ambulatory Visit: Payer: Self-pay | Admitting: Physician Assistant

## 2020-10-06 DIAGNOSIS — J019 Acute sinusitis, unspecified: Secondary | ICD-10-CM | POA: Diagnosis not present

## 2020-10-06 DIAGNOSIS — Z87891 Personal history of nicotine dependence: Secondary | ICD-10-CM | POA: Diagnosis not present

## 2020-10-06 DIAGNOSIS — Z299 Encounter for prophylactic measures, unspecified: Secondary | ICD-10-CM | POA: Diagnosis not present

## 2020-10-06 DIAGNOSIS — F339 Major depressive disorder, recurrent, unspecified: Secondary | ICD-10-CM | POA: Diagnosis not present

## 2020-10-06 DIAGNOSIS — J449 Chronic obstructive pulmonary disease, unspecified: Secondary | ICD-10-CM | POA: Diagnosis not present

## 2020-10-06 DIAGNOSIS — Z681 Body mass index (BMI) 19 or less, adult: Secondary | ICD-10-CM | POA: Diagnosis not present

## 2020-10-06 DIAGNOSIS — F319 Bipolar disorder, unspecified: Secondary | ICD-10-CM | POA: Diagnosis not present

## 2020-10-21 DIAGNOSIS — I10 Essential (primary) hypertension: Secondary | ICD-10-CM | POA: Diagnosis not present

## 2020-10-21 DIAGNOSIS — R0989 Other specified symptoms and signs involving the circulatory and respiratory systems: Secondary | ICD-10-CM | POA: Diagnosis not present

## 2020-10-21 DIAGNOSIS — J449 Chronic obstructive pulmonary disease, unspecified: Secondary | ICD-10-CM | POA: Diagnosis not present

## 2020-10-21 DIAGNOSIS — F319 Bipolar disorder, unspecified: Secondary | ICD-10-CM | POA: Diagnosis not present

## 2020-10-21 DIAGNOSIS — Z299 Encounter for prophylactic measures, unspecified: Secondary | ICD-10-CM | POA: Diagnosis not present

## 2020-10-21 DIAGNOSIS — Z681 Body mass index (BMI) 19 or less, adult: Secondary | ICD-10-CM | POA: Diagnosis not present

## 2020-10-23 ENCOUNTER — Other Ambulatory Visit (HOSPITAL_COMMUNITY): Payer: Self-pay | Admitting: *Deleted

## 2020-10-26 ENCOUNTER — Encounter (HOSPITAL_COMMUNITY)
Admission: RE | Admit: 2020-10-26 | Discharge: 2020-10-26 | Disposition: A | Discharge status: Home / Self Care | Payer: Medicare PPO | Source: Ambulatory Visit | Attending: Psychiatry | Admitting: Psychiatry

## 2020-10-26 ENCOUNTER — Other Ambulatory Visit: Payer: Self-pay

## 2020-10-26 DIAGNOSIS — G35 Multiple sclerosis: Secondary | ICD-10-CM | POA: Insufficient documentation

## 2020-10-26 MED ORDER — METHYLPREDNISOLONE SODIUM SUCC 125 MG IJ SOLR: 125.0000 mg | Freq: Once | INTRAMUSCULAR | Status: AC

## 2020-10-26 MED ORDER — ACETAMINOPHEN 500 MG PO TABS: 1000.0000 mg | ORAL_TABLET | Freq: Once | ORAL | Status: AC

## 2020-10-26 MED ORDER — DIPHENHYDRAMINE HCL 50 MG/ML IJ SOLN: 25.0000 mg | Freq: Once | INTRAMUSCULAR | Status: AC

## 2020-10-26 MED ORDER — SODIUM CHLORIDE 0.9 % IV SOLN
1000.0000 mg | Freq: Once | INTRAVENOUS | Status: DC
  Administered 2020-10-26: 1000 mg via INTRAVENOUS
  Filled 2020-10-26: qty 100

## 2020-10-26 MED ORDER — ACETAMINOPHEN 500 MG PO TABS
ORAL_TABLET | ORAL | Status: AC
  Administered 2020-10-26: 1000 mg via ORAL
  Filled 2020-10-26: qty 2

## 2020-10-26 MED ORDER — DIPHENHYDRAMINE HCL 50 MG/ML IJ SOLN
INTRAMUSCULAR | Status: AC
  Administered 2020-10-26: 25 mg via INTRAVENOUS
  Filled 2020-10-26: qty 1

## 2020-10-26 MED ORDER — METHYLPREDNISOLONE SODIUM SUCC 125 MG IJ SOLR
INTRAMUSCULAR | Status: AC
  Administered 2020-10-26: 125 mg via INTRAVENOUS
  Filled 2020-10-26: qty 2

## 2020-10-28 ENCOUNTER — Telehealth: Payer: Self-pay | Admitting: Gastroenterology

## 2020-10-28 ENCOUNTER — Other Ambulatory Visit: Payer: Self-pay | Admitting: Physician Assistant

## 2020-10-28 DIAGNOSIS — K259 Gastric ulcer, unspecified as acute or chronic, without hemorrhage or perforation: Secondary | ICD-10-CM

## 2020-10-28 MED ORDER — OMEPRAZOLE 40 MG PO CPDR: 40.0000 mg | DELAYED_RELEASE_CAPSULE | Freq: Every day | ORAL | 0 refills | Status: DC

## 2020-10-28 NOTE — Telephone Encounter (Signed)
Inbound call from patient. Need medication refill for Omeprazole 40mg  capsule. [facility] [place]. Patient would also like to be contacted when sent [phone]

## 2020-10-28 NOTE — Telephone Encounter (Signed)
Sent script to [facility] and called patient. Let her know she needs an office visit. She will call back to schedule an appointment b/c she has a lot going on in the next couple of months.

## 2020-11-06 DIAGNOSIS — R438 Other disturbances of smell and taste: Secondary | ICD-10-CM | POA: Diagnosis not present

## 2020-11-06 DIAGNOSIS — J310 Chronic rhinitis: Secondary | ICD-10-CM | POA: Diagnosis not present

## 2020-11-06 DIAGNOSIS — J342 Deviated nasal septum: Secondary | ICD-10-CM | POA: Diagnosis not present

## 2020-11-06 DIAGNOSIS — J343 Hypertrophy of nasal turbinates: Secondary | ICD-10-CM | POA: Diagnosis not present

## 2020-11-09 ENCOUNTER — Encounter (HOSPITAL_COMMUNITY)
Admission: RE | Admit: 2020-11-09 | Discharge: 2020-11-09 | Disposition: A | Discharge status: Home / Self Care | Payer: Medicare PPO | Source: Ambulatory Visit | Attending: Psychiatry | Admitting: Psychiatry

## 2020-11-09 ENCOUNTER — Ambulatory Visit: Payer: Medicare PPO | Admitting: Internal Medicine

## 2020-11-09 ENCOUNTER — Other Ambulatory Visit: Payer: Self-pay

## 2020-11-09 DIAGNOSIS — G35 Multiple sclerosis: Secondary | ICD-10-CM | POA: Diagnosis not present

## 2020-11-09 MED ORDER — DIPHENHYDRAMINE HCL 50 MG/ML IJ SOLN: 25.0000 mg | Freq: Once | INTRAMUSCULAR | Status: AC

## 2020-11-09 MED ORDER — METHYLPREDNISOLONE SODIUM SUCC 125 MG IJ SOLR: 125.0000 mg | Freq: Once | INTRAMUSCULAR | Status: AC

## 2020-11-09 MED ORDER — SODIUM CHLORIDE 0.9 % IV SOLN
1000.0000 mg | Freq: Once | INTRAVENOUS | Status: AC
  Administered 2020-11-09: 1000 mg via INTRAVENOUS
  Filled 2020-11-09: qty 100

## 2020-11-09 MED ORDER — ACETAMINOPHEN 500 MG PO TABS
ORAL_TABLET | ORAL | Status: AC
  Administered 2020-11-09: 1000 mg via ORAL
  Filled 2020-11-09: qty 2

## 2020-11-09 MED ORDER — ACETAMINOPHEN 500 MG PO TABS: 1000.0000 mg | ORAL_TABLET | Freq: Once | ORAL | Status: AC

## 2020-11-09 MED ORDER — METHYLPREDNISOLONE SODIUM SUCC 125 MG IJ SOLR
INTRAMUSCULAR | Status: AC
  Administered 2020-11-09: 125 mg via INTRAVENOUS
  Filled 2020-11-09: qty 2

## 2020-11-09 MED ORDER — DIPHENHYDRAMINE HCL 50 MG/ML IJ SOLN
INTRAMUSCULAR | Status: AC
  Administered 2020-11-09: 25 mg via INTRAVENOUS
  Filled 2020-11-09: qty 1

## 2020-11-13 ENCOUNTER — Other Ambulatory Visit: Payer: Self-pay | Admitting: Otolaryngology

## 2020-11-13 DIAGNOSIS — J329 Chronic sinusitis, unspecified: Secondary | ICD-10-CM

## 2020-11-25 ENCOUNTER — Ambulatory Visit: Payer: Medicare PPO | Admitting: Physician Assistant

## 2020-11-30 ENCOUNTER — Ambulatory Visit
Admission: RE | Admit: 2020-11-30 | Discharge: 2020-11-30 | Disposition: A | Discharge status: Home / Self Care | Payer: Medicare PPO | Source: Ambulatory Visit | Attending: Otolaryngology | Admitting: Otolaryngology

## 2020-11-30 ENCOUNTER — Ambulatory Visit (INDEPENDENT_AMBULATORY_CARE_PROVIDER_SITE_OTHER): Payer: Medicare PPO | Admitting: Physician Assistant

## 2020-11-30 ENCOUNTER — Encounter: Payer: Self-pay | Admitting: Physician Assistant

## 2020-11-30 ENCOUNTER — Other Ambulatory Visit: Payer: Self-pay

## 2020-11-30 DIAGNOSIS — F3341 Major depressive disorder, recurrent, in partial remission: Secondary | ICD-10-CM | POA: Diagnosis not present

## 2020-11-30 DIAGNOSIS — F411 Generalized anxiety disorder: Secondary | ICD-10-CM

## 2020-11-30 DIAGNOSIS — G35 Multiple sclerosis: Secondary | ICD-10-CM

## 2020-11-30 DIAGNOSIS — G4700 Insomnia, unspecified: Secondary | ICD-10-CM

## 2020-11-30 DIAGNOSIS — J3489 Other specified disorders of nose and nasal sinuses: Secondary | ICD-10-CM | POA: Diagnosis not present

## 2020-11-30 DIAGNOSIS — J321 Chronic frontal sinusitis: Secondary | ICD-10-CM | POA: Diagnosis not present

## 2020-11-30 DIAGNOSIS — J329 Chronic sinusitis, unspecified: Secondary | ICD-10-CM

## 2020-11-30 NOTE — Progress Notes (Signed)
[facility]  Patient ID: Angel Maddox,  MRN: [id number]  PCP: Monico Blitz, MD  Date of Evaluation: 11/30/2020 Time spent:20 minutes   HISTORY/CURRENT STATUS: For routine med check.  Accompanied by her grandson.  Doing well. Mood is good.  No sx of depression.  Is able to enjoy things.  Energy and motivation are good for her.  As far as the MS goes, she is doing great!  Able to walk well without her Rolader walker now! States she is feeling better than she has in a long time.  Not isolating.  No suicidal or homicidal thoughts.  States she went to her son's grave in the past few months which she had not been able to do for a while.  Believes it helped her to go and visit there.  She sleeps well.  Anxiety is well controlled with her medication.  Her husband is still in charge of her medicines, she does not even know where he has them hidden.  Patient denies increased energy with decreased need for sleep, no increased talkativeness, no racing thoughts, no impulsivity or risky behaviors, no increased spending, no increased libido, no grandiosity, no increased irritability or anger, no paranoia, and no hallucinations.  No change in musculoskeletal or neurologic review of systems. Walking better.  Individual Medical History/ Review of Systems: Changes? :No    Past medications for mental health diagnoses include: Depakote, Lamictal, Risperdal, Celexa, Klonopin, trazodone, Wellbutrin, Vistaril, primidone, gabapentin, Prozac, Zyprexa, Ambien, Lunesta, Effexor  Allergies: Hydrocodone  Current Medications:  Current Outpatient Medications:    amLODipine (NORVASC) 10 MG tablet, Take 1 tablet (10 mg total) by mouth daily., Disp: 90 tablet, Rfl: 1   baclofen (LIORESAL) 10 MG tablet, Take 10 mg by mouth 3 (three) times daily., Disp: , Rfl:    buPROPion (WELLBUTRIN SR) 200 MG 12 hr tablet, Take 1 tablet (200 mg total) by mouth 2 (two) times daily., Disp: 60 tablet, Rfl: 11    clonazePAM (KLONOPIN) 0.5 MG tablet, , Disp: , Rfl:    dalfampridine 10 MG TB12, , Disp: , Rfl:    gabapentin (NEURONTIN) 300 MG capsule, Take 600 mg by mouth 3 (three) times daily., Disp: , Rfl:    ipratropium-albuterol (DUONEB) 0.5-2.5 (3) MG/3ML SOLN, USE 1 VIAL IN NEBULIZER EVERY 6 HOURS - And as needed, Disp: 120 mL, Rfl: 11   losartan (COZAAR) 25 MG tablet, TAKE 1 TABLET BY MOUTH EVERY DAY, Disp: 90 tablet, Rfl: 1   mirtazapine (REMERON) 15 MG tablet, Take 0.5-1 tablets (7.5-15 mg total) by mouth at bedtime as needed., Disp: 30 tablet, Rfl: 11   Nebulizers (COMPRESSOR/NEBULIZER) MISC, Use as directed, Disp: 1 each, Rfl: 0   omeprazole (PRILOSEC) 40 MG capsule, Take 1 capsule (40 mg total) by mouth daily before breakfast. Please schedule a yearly follow up for more refills. Thank you., Disp: 90 capsule, Rfl: 0   Oxycodone HCl 10 MG TABS, Take 1 tablet (10 mg total) by mouth every 6 (six) hours as needed ((score 7 to 10))., Disp: 120 tablet, Rfl: 0   primidone (MYSOLINE) 250 MG tablet, Take 125 mg by mouth 3 (three) times daily. , Disp: , Rfl:    riTUXimab (RITUXAN) 100 MG/10ML injection, Inject into the vein., Disp: , Rfl:    tiZANidine (ZANAFLEX) 4 MG capsule, , Disp: , Rfl:    benzonatate (TESSALON) 200 MG capsule, Take 1 capsule (200 mg total) by mouth 2 (two) times daily as needed for cough. (Patient not taking: Reported on  11/30/2020), Disp: 20 capsule, Rfl: 0   Budeson-Glycopyrrol-Formoterol (BREZTRI AEROSPHERE) 160-9-4.8 MCG/ACT AERO, Inhale 2 puffs into the lungs 2 (two) times daily. (Patient not taking: Reported on 11/30/2020), Disp: 10.7 g, Rfl: 0   citalopram (CELEXA) 40 MG tablet, TAKE 1 TABLET BY MOUTH EVERY DAY, Disp: 90 tablet, Rfl: 1   gabapentin (NEURONTIN) 300 MG capsule, Take 2 capsules (600 mg total) by mouth 3 (three) times daily. (Patient taking differently: Take 900 mg by mouth 3 (three) times daily. ), Disp: 180 capsule, Rfl: 0   ondansetron (ZOFRAN-ODT) 4 MG disintegrating  tablet, TAKE 1 TABLET BY MOUTH EVERY 8 HOURS AS NEEDED FOR NAUSEA AND VOMITING (Patient not taking: No sig reported), Disp: 30 tablet, Rfl: 2   PROAIR HFA 108 (90 Base) MCG/ACT inhaler, INHALE 2 PUFFS INTO THE LUNGS EVERY 4 (FOUR) HOURS AS NEEDED FOR WHEEZING OR SHORTNESS OF BREATH. (Patient not taking: No sig reported), Disp: 8.5 Inhaler, Rfl: 1 Medication Side Effects: none  Family Medical/ Social History: Changes? No  MENTAL HEALTH EXAM:  There were no vitals taken for this visit.There is no height or weight on file to calculate BMI.  General Appearance: Casual, Neat and Well Groomed  Eye Contact:  Good  Speech:  Clear and Coherent and Normal Rate  Volume:  Normal  Mood:  Euthymic  Affect:  Appropriate  Thought Process:  Goal Directed and Descriptions of Associations: Intact  Orientation:  Full (Time, Place, and Person)  Thought Content: Logical   Suicidal Thoughts:  No  Homicidal Thoughts:  No  Memory:  WNL  Judgement:  Good  Insight:  Good  Psychomotor Activity:  Normal  Concentration:  Concentration: Good  Recall:  Good  Fund of Knowledge: Good  Language: Good  Assets:  Desire for Improvement  ADLs   Cognition: WNL  Prognosis:  Fair    DIAGNOSES:    ICD-10-CM   1. Recurrent major depressive disorder, in partial remission ([place])  F33.41     2. Generalized anxiety disorder  F41.1     3. Insomnia, unspecified type  G47.00     4. MS (multiple sclerosis) ([place])  G35       Receiving Psychotherapy: Yes  [name], LCSW   RECOMMENDATIONS:  PDMP was reviewed. I provided 20 minutes of face to face time during this encounter, including time spent before and after the visit in records review, medical decision making, and charting.  I am glad to see her doing so well!  No changes in medications are necessary. Per patient request and due to history of overdose, only give a 30-day supply, vs 90 days, at a time when refills are due. Continue Wellbutrin SR 200 mg, 1 p.o.  twice daily. Continue Celexa 40 mg, 1 p.o. daily. Continue Klonopin 0.5 mg as directed per neurology. Continue mirtazapine 15 mg, 1/2-1 p.o. nightly as needed sleep. Return in 6 months.  Donnal Moat, PA-C

## 2020-12-02 ENCOUNTER — Other Ambulatory Visit: Payer: Self-pay | Admitting: Physician Assistant

## 2020-12-03 DIAGNOSIS — J310 Chronic rhinitis: Secondary | ICD-10-CM | POA: Diagnosis not present

## 2020-12-03 DIAGNOSIS — J342 Deviated nasal septum: Secondary | ICD-10-CM | POA: Diagnosis not present

## 2020-12-03 DIAGNOSIS — J343 Hypertrophy of nasal turbinates: Secondary | ICD-10-CM | POA: Diagnosis not present

## 2020-12-04 ENCOUNTER — Other Ambulatory Visit: Payer: Self-pay | Admitting: Otolaryngology

## 2021-01-05 ENCOUNTER — Other Ambulatory Visit: Payer: Self-pay

## 2021-01-05 ENCOUNTER — Encounter (HOSPITAL_BASED_OUTPATIENT_CLINIC_OR_DEPARTMENT_OTHER): Payer: Self-pay | Admitting: Otolaryngology

## 2021-01-11 ENCOUNTER — Other Ambulatory Visit: Payer: Self-pay

## 2021-01-11 ENCOUNTER — Encounter (HOSPITAL_BASED_OUTPATIENT_CLINIC_OR_DEPARTMENT_OTHER)
Admission: RE | Admit: 2021-01-11 | Discharge: 2021-01-11 | Disposition: A | Discharge status: Home / Self Care | Payer: Medicare PPO | Source: Ambulatory Visit | Attending: Otolaryngology | Admitting: Otolaryngology

## 2021-01-11 DIAGNOSIS — I10 Essential (primary) hypertension: Secondary | ICD-10-CM | POA: Diagnosis not present

## 2021-01-11 DIAGNOSIS — Z01810 Encounter for preprocedural cardiovascular examination: Secondary | ICD-10-CM | POA: Diagnosis not present

## 2021-01-12 ENCOUNTER — Ambulatory Visit (HOSPITAL_BASED_OUTPATIENT_CLINIC_OR_DEPARTMENT_OTHER): Payer: Medicare PPO | Admitting: Anesthesiology

## 2021-01-12 ENCOUNTER — Other Ambulatory Visit: Payer: Self-pay

## 2021-01-12 ENCOUNTER — Encounter (HOSPITAL_BASED_OUTPATIENT_CLINIC_OR_DEPARTMENT_OTHER)
Admission: RE | Disposition: A | Discharge status: Home / Self Care | Payer: Self-pay | Source: Home / Self Care | Attending: Otolaryngology

## 2021-01-12 ENCOUNTER — Ambulatory Visit (HOSPITAL_BASED_OUTPATIENT_CLINIC_OR_DEPARTMENT_OTHER)
Admission: RE | Admit: 2021-01-12 | Discharge: 2021-01-12 | Disposition: A | Discharge status: Home / Self Care | Payer: Medicare PPO | Attending: Otolaryngology | Admitting: Otolaryngology

## 2021-01-12 DIAGNOSIS — J343 Hypertrophy of nasal turbinates: Secondary | ICD-10-CM | POA: Insufficient documentation

## 2021-01-12 DIAGNOSIS — J3489 Other specified disorders of nose and nasal sinuses: Secondary | ICD-10-CM | POA: Diagnosis not present

## 2021-01-12 DIAGNOSIS — E876 Hypokalemia: Secondary | ICD-10-CM | POA: Diagnosis not present

## 2021-01-12 DIAGNOSIS — J342 Deviated nasal septum: Secondary | ICD-10-CM | POA: Insufficient documentation

## 2021-01-12 HISTORY — PX: NASAL SEPTOPLASTY W/ TURBINOPLASTY: SHX2070

## 2021-01-12 HISTORY — DX: Deviated nasal septum: J34.2

## 2021-01-12 SURGERY — SEPTOPLASTY, NOSE, WITH NASAL TURBINATE REDUCTION
Anesthesia: General | Site: Nose | Laterality: Bilateral

## 2021-01-12 MED ORDER — LIDOCAINE HCL (CARDIAC) PF 100 MG/5ML IV SOSY
PREFILLED_SYRINGE | INTRAVENOUS | Status: DC | PRN
  Administered 2021-01-12: 50 mg via INTRAVENOUS

## 2021-01-12 MED ORDER — ROCURONIUM BROMIDE 10 MG/ML (PF) SYRINGE
PREFILLED_SYRINGE | INTRAVENOUS | Status: AC
  Filled 2021-01-12: qty 10

## 2021-01-12 MED ORDER — CEFAZOLIN SODIUM-DEXTROSE 2-4 GM/100ML-% IV SOLN
INTRAVENOUS | Status: AC
  Filled 2021-01-12: qty 100

## 2021-01-12 MED ORDER — PROPOFOL 10 MG/ML IV BOLUS
INTRAVENOUS | Status: DC | PRN
  Administered 2021-01-12: 100 mg via INTRAVENOUS

## 2021-01-12 MED ORDER — MIDAZOLAM HCL 5 MG/5ML IJ SOLN
INTRAMUSCULAR | Status: DC | PRN
  Administered 2021-01-12: 2 mg via INTRAVENOUS

## 2021-01-12 MED ORDER — AMOXICILLIN 875 MG PO TABS: 875.0000 mg | ORAL_TABLET | Freq: Two times a day (BID) | ORAL | 0 refills | Status: AC

## 2021-01-12 MED ORDER — FENTANYL CITRATE (PF) 100 MCG/2ML IJ SOLN
INTRAMUSCULAR | Status: AC
  Filled 2021-01-12: qty 2

## 2021-01-12 MED ORDER — DEXAMETHASONE SODIUM PHOSPHATE 4 MG/ML IJ SOLN
INTRAMUSCULAR | Status: DC | PRN
  Administered 2021-01-12: 6 mg via INTRAVENOUS

## 2021-01-12 MED ORDER — DEXAMETHASONE SODIUM PHOSPHATE 10 MG/ML IJ SOLN
INTRAMUSCULAR | Status: AC
  Filled 2021-01-12: qty 1

## 2021-01-12 MED ORDER — ONDANSETRON HCL 4 MG/2ML IJ SOLN
INTRAMUSCULAR | Status: AC
  Filled 2021-01-12: qty 2

## 2021-01-12 MED ORDER — LIDOCAINE HCL (PF) 2 % IJ SOLN
INTRAMUSCULAR | Status: AC
  Filled 2021-01-12: qty 5

## 2021-01-12 MED ORDER — FENTANYL CITRATE (PF) 100 MCG/2ML IJ SOLN
INTRAMUSCULAR | Status: DC | PRN
  Administered 2021-01-12: 50 ug via INTRAVENOUS

## 2021-01-12 MED ORDER — PROPOFOL 10 MG/ML IV BOLUS
INTRAVENOUS | Status: AC
  Filled 2021-01-12: qty 20

## 2021-01-12 MED ORDER — OXYMETAZOLINE HCL 0.05 % NA SOLN
NASAL | Status: DC | PRN
  Administered 2021-01-12: 1 via TOPICAL

## 2021-01-12 MED ORDER — MUPIROCIN 2 % EX OINT
TOPICAL_OINTMENT | CUTANEOUS | Status: DC | PRN
  Administered 2021-01-12: 1 via TOPICAL

## 2021-01-12 MED ORDER — CEFAZOLIN SODIUM-DEXTROSE 2-3 GM-%(50ML) IV SOLR
INTRAVENOUS | Status: DC | PRN
  Administered 2021-01-12: 2 g via INTRAVENOUS

## 2021-01-12 MED ORDER — ACETAMINOPHEN 500 MG PO TABS
ORAL_TABLET | ORAL | Status: AC
  Filled 2021-01-12: qty 2

## 2021-01-12 MED ORDER — OXYCODONE-ACETAMINOPHEN 5-325 MG PO TABS: 1.0000 | ORAL_TABLET | ORAL | 0 refills | Status: AC | PRN

## 2021-01-12 MED ORDER — EPHEDRINE SULFATE 50 MG/ML IJ SOLN
INTRAMUSCULAR | Status: DC | PRN
  Administered 2021-01-12 (×2): 10 mg via INTRAVENOUS

## 2021-01-12 MED ORDER — FENTANYL CITRATE (PF) 100 MCG/2ML IJ SOLN
25.0000 ug | INTRAMUSCULAR | Status: DC | PRN
  Administered 2021-01-12: 25 ug via INTRAVENOUS

## 2021-01-12 MED ORDER — MIDAZOLAM HCL 2 MG/2ML IJ SOLN
INTRAMUSCULAR | Status: AC
  Filled 2021-01-12: qty 2

## 2021-01-12 MED ORDER — LACTATED RINGERS IV SOLN: INTRAVENOUS | Status: DC

## 2021-01-12 MED ORDER — LIDOCAINE-EPINEPHRINE 1 %-1:100000 IJ SOLN
INTRAMUSCULAR | Status: DC | PRN
  Administered 2021-01-12: 3 mL

## 2021-01-12 MED ORDER — SUGAMMADEX SODIUM 200 MG/2ML IV SOLN
INTRAVENOUS | Status: DC | PRN
  Administered 2021-01-12: 200 mg via INTRAVENOUS

## 2021-01-12 MED ORDER — ONDANSETRON HCL 4 MG/2ML IJ SOLN
INTRAMUSCULAR | Status: DC | PRN
  Administered 2021-01-12: 4 mg via INTRAVENOUS

## 2021-01-12 MED ORDER — ROCURONIUM BROMIDE 100 MG/10ML IV SOLN
INTRAVENOUS | Status: DC | PRN
  Administered 2021-01-12: 50 mg via INTRAVENOUS

## 2021-01-12 MED ORDER — ACETAMINOPHEN 500 MG PO TABS
1000.0000 mg | ORAL_TABLET | Freq: Once | ORAL | Status: AC
  Administered 2021-01-12: 1000 mg via ORAL

## 2021-01-12 SURGICAL SUPPLY — 32 items
ATTRACTOMAT 16X20 MAGNETIC DRP (DRAPES) IMPLANT
CANISTER SUCT 1200ML W/VALVE (MISCELLANEOUS) ×2 IMPLANT
COAGULATOR SUCT 8FR VV (MISCELLANEOUS) ×2 IMPLANT
DECANTER SPIKE VIAL GLASS SM (MISCELLANEOUS) IMPLANT
DRSG NASOPORE 8CM (GAUZE/BANDAGES/DRESSINGS) IMPLANT
DRSG TELFA 3X8 NADH (GAUZE/BANDAGES/DRESSINGS) IMPLANT
ELECT REM PT RETURN 9FT ADLT (ELECTROSURGICAL) ×2
ELECTRODE REM PT RTRN 9FT ADLT (ELECTROSURGICAL) ×1 IMPLANT
GLOVE SURG ENC MOIS LTX SZ7.5 (GLOVE) ×2 IMPLANT
GLOVE SURG POLYISO LF SZ7 (GLOVE) ×1 IMPLANT
GLOVE SURG UNDER POLY LF SZ7 (GLOVE) ×1 IMPLANT
GOWN STRL REUS W/ TWL LRG LVL3 (GOWN DISPOSABLE) ×2 IMPLANT
GOWN STRL REUS W/TWL LRG LVL3 (GOWN DISPOSABLE) ×4
NDL HYPO 25X1 1.5 SAFETY (NEEDLE) ×1 IMPLANT
NEEDLE HYPO 25X1 1.5 SAFETY (NEEDLE) ×2 IMPLANT
NS IRRIG 1000ML POUR BTL (IV SOLUTION) ×2 IMPLANT
PACK BASIN DAY SURGERY FS (CUSTOM PROCEDURE TRAY) ×2 IMPLANT
PACK ENT DAY SURGERY (CUSTOM PROCEDURE TRAY) ×2 IMPLANT
SLEEVE SCD COMPRESS KNEE MED (STOCKING) ×1 IMPLANT
SOLUTION BUTLER CLEAR DIP (MISCELLANEOUS) ×2 IMPLANT
SPLINT NASAL AIRWAY SILICONE (MISCELLANEOUS) ×2 IMPLANT
SPONGE GAUZE 2X2 8PLY STRL LF (GAUZE/BANDAGES/DRESSINGS) ×2 IMPLANT
SPONGE NEURO XRAY DETECT 1X3 (DISPOSABLE) ×2 IMPLANT
SUT CHROMIC 4 0 P 3 18 (SUTURE) ×2 IMPLANT
SUT PLAIN 4 0 ~~LOC~~ 1 (SUTURE) ×2 IMPLANT
SUT PROLENE 3 0 PS 2 (SUTURE) ×2 IMPLANT
SUT VIC AB 4-0 P-3 18XBRD (SUTURE) IMPLANT
SUT VIC AB 4-0 P3 18 (SUTURE)
TOWEL GREEN STERILE FF (TOWEL DISPOSABLE) ×2 IMPLANT
TUBE SALEM SUMP 12R W/ARV (TUBING) IMPLANT
TUBE SALEM SUMP 16 FR W/ARV (TUBING) ×2 IMPLANT
YANKAUER SUCT BULB TIP NO VENT (SUCTIONS) ×2 IMPLANT

## 2021-01-12 NOTE — Discharge Instructions (Addendum)
No Tylenol before 1:00pm if needed.   Post Anesthesia Home Care Instructions  Activity: Get plenty of rest for the remainder of the day. A responsible individual must stay with you for 24 hours following the procedure.  For the next 24 hours, DO NOT: -Drive a car -Paediatric nurse -Drink alcoholic beverages -Take any medication unless instructed by your physician -Make any legal decisions or sign important papers.  Meals: Start with liquid foods such as gelatin or soup. Progress to regular foods as tolerated. Avoid greasy, spicy, heavy foods. If nausea and/or vomiting occur, drink only clear liquids until the nausea and/or vomiting subsides. Call your physician if vomiting continues.  Special Instructions/Symptoms: Your throat may feel dry or sore from the anesthesia or the breathing tube placed in your throat during surgery. If this causes discomfort, gargle with warm salt water. The discomfort should disappear within 24 hours.  If you had a scopolamine patch placed behind your ear for the management of post- operative nausea and/or vomiting:  1. The medication in the patch is effective for 72 hours, after which it should be removed.  Wrap patch in a tissue and discard in the trash. Wash hands thoroughly with soap and water. 2. You may remove the patch earlier than 72 hours if you experience unpleasant side effects which may include dry mouth, dizziness or visual disturbances. 3. Avoid touching the patch. Wash your hands with soap and water after contact with the patch.  -----------------------  POSTOPERATIVE INSTRUCTIONS FOR PATIENTS HAVING NASAL OR SINUS OPERATIONS ACTIVITY: Restrict activity at home for the first two days, resting as much as possible. Light activity is best. You may usually return to work within a week. You should refrain from nose blowing, strenuous activity, or heavy lifting greater than 20lbs for a total of one week after your operation.  If sneezing cannot be  avoided, sneeze with your mouth open. DISCOMFORT: You may experience a dull headache and pressure along with nasal congestion and discharge. These symptoms may be worse during the first week after the operation but may last as long as two to four weeks.  Please take Tylenol or the pain medication that has been prescribed for you. Do not take aspirin or aspirin containing medications since they may cause bleeding.  You may experience symptoms of post nasal drainage, nasal congestion, headaches and fatigue for two or three months after your operation.  BLEEDING: You may have some blood tinged nasal drainage for approximately two weeks after the operation.  The discharge will be worse for the first week.  Please call our office at [phone] or go to the nearest hospital emergency room if you experience any of the following: heavy, bright red blood from your nose or mouth that lasts longer than 15 minutes or coughing up or vomiting bright red blood or blood clots. GENERAL CONSIDERATIONS: A gauze dressing will be placed on your upper lip to absorb any drainage after the operation. You may need to change this several times a day.  If you do not have very much drainage, you may remove the dressing.  Remember that you may gently wipe your nose with a tissue and sniff in, but DO NOT blow your nose. Please keep all of your postoperative appointments.  Your final results after the operation will depend on proper follow-up.  The initial visit is usually 2 to 5 days after the operation.  During this visit, the remaining nasal packing and internal septal splints will be removed.  Your nasal  and sinus cavities will be cleaned.  During the second visit, your nasal and sinus cavities will be cleaned again. Have someone drive you to your first two postoperative appointments.  How you care for your nose after the operation will influence the results that you obtain.  You should follow all directions, take your medication as  prescribed, and call our office [phone] with any problems or questions. You may be more comfortable sleeping with your head elevated on two pillows. Do not take any medications that we have not prescribed or recommended. WARNING SIGNS: if any of the following should occur, please call our office: Persistent fever greater than 102F. Persistent vomiting. Severe and constant pain that is not relieved by prescribed pain medication. Trauma to the nose. Rash or unusual side effects from any medicines.

## 2021-01-12 NOTE — Op Note (Signed)
DATE OF PROCEDURE: 01/12/2021  OPERATIVE REPORT   SURGEON: Leta Baptist, MD   PREOPERATIVE DIAGNOSES:  1. Severe nasal septal deviation.  2. Bilateral inferior turbinate hypertrophy.  3. Chronic nasal obstruction.  POSTOPERATIVE DIAGNOSES:  1. Severe nasal septal deviation.  2. Bilateral inferior turbinate hypertrophy.  3. Chronic nasal obstruction.  PROCEDURE PERFORMED:  1. Septoplasty.  2. Bilateral partial inferior turbinate resection.   ANESTHESIA: General endotracheal tube anesthesia.   COMPLICATIONS: None.   ESTIMATED BLOOD LOSS: 50 mL.   INDICATION FOR PROCEDURE: Angel Maddox is a 65 y.o. female with a history of chronic nasal obstruction. The patient was treated with antihistamine, decongestant, and steroid nasal sprays. However, the patient continued to be symptomatic. On examination, the patient was noted to have bilateral severe inferior turbinate hypertrophy and significant nasal septal deviation, causing significant nasal obstruction. Based on the above findings, the decision was made for the patient to undergo the above-stated procedures. The risks, benefits, alternatives, and details of the procedures were discussed with the patient. Questions were invited and answered. Informed consent was obtained.   DESCRIPTION OF PROCEDURE: The patient was taken to the operating room and placed supine on the operating table. General endotracheal tube anesthesia was administered by the anesthesiologist. The patient was positioned, and prepped and draped in the standard fashion for nasal surgery. Pledgets soaked with Afrin were placed in both nasal cavities for decongestion. The pledgets were subsequently removed.   Examination of the nasal cavity revealed a severe nasal septal deviation. 1% lidocaine with 1:100,000 epinephrine was injected onto the nasal septum bilaterally. A hemitransfixion incision was made on the left side. The mucosal flap was carefully elevated on the left side. A  cartilaginous incision was made 1 cm superior to the caudal margin of the nasal septum. Mucosal flap was also elevated on the right side in the similar fashion. It should be noted that due to the severe septal deviation, the deviated portion of the cartilaginous and bony septum had to be removed in piecemeal fashion. Once the deviated portions were removed, a straight midline septum was achieved. The septum was then quilted with 4-0 plain gut sutures. The hemitransfixion incision was closed with interrupted 4-0 chromic sutures.   The inferior one half of both hypertrophied inferior turbinate was crossclamped with a Kelly clamp. The inferior one half of each inferior turbinate was then resected with a pair of cross cutting scissors. Hemostasis was achieved with a suction cautery device.  Doyle splints were applied to the nasal septum.  The care of the patient was turned over to the anesthesiologist. The patient was awakened from anesthesia without difficulty. The patient was extubated and transferred to the recovery room in good condition.   OPERATIVE FINDINGS: Severe nasal septal deviation and bilateral inferior turbinate hypertrophy.   SPECIMEN: None.   FOLLOWUP CARE: The patient be discharged home once she is awake and alert. The patient will be placed on Percocet 1 tablets p.o. q.4 hours p.r.n. pain, and amoxicillin 875 mg p.o. b.i.d. for 3 days. The patient will follow up in my office in 3 days for splint removal.   Angel Vincent Raynelle Bring, MD

## 2021-01-12 NOTE — Transfer of Care (Signed)
Immediate Anesthesia Transfer of Care Note  Patient: Angel Maddox  Procedure(s) Performed: NASAL SEPTOPLASTY WITH BILATERAL  TURBINATE REDUCTION (Bilateral: Nose)  Patient Location: PACU and ICU  Anesthesia Type:General  Level of Consciousness: drowsy  Airway & Oxygen Therapy: Patient Spontanous Breathing and Patient connected to face mask oxygen  Post-op Assessment: Report given to RN and Post -op Vital signs reviewed and stable  Post vital signs: Reviewed and stable  Last Vitals:  Vitals Value Taken Time  BP 152/89   Temp    Pulse 71 01/12/21 0933  Resp 25 01/12/21 0933  SpO2 98 % 01/12/21 0933  Vitals shown include unvalidated device data.  Last Pain:  Vitals:   01/12/21 0651  TempSrc: Oral  PainSc: 3          Complications: No notable events documented.

## 2021-01-12 NOTE — Anesthesia Procedure Notes (Signed)
Procedure Name: Intubation Date/Time: 01/12/2021 8:36 AM Performed by: Ezequiel Kayser, CRNA Pre-anesthesia Checklist: Patient identified, Emergency Drugs available, Suction available and Patient being monitored Patient Re-evaluated:Patient Re-evaluated prior to induction Oxygen Delivery Method: Circle System Utilized Preoxygenation: Pre-oxygenation with 100% oxygen Induction Type: IV induction Ventilation: Mask ventilation without difficulty Laryngoscope Size: Mac and 3 Grade View: Grade I Tube type: Oral Rae Tube size: 7.0 mm Number of attempts: 1 Airway Equipment and Method: Stylet and Oral airway Placement Confirmation: ETT inserted through vocal cords under direct vision, positive ETCO2 and breath sounds checked- equal and bilateral Tube secured with: Tape Dental Injury: Teeth and Oropharynx as per pre-operative assessment

## 2021-01-12 NOTE — Anesthesia Preprocedure Evaluation (Addendum)
Anesthesia Evaluation  Patient identified by MRN, date of birth, ID band Patient awake    Reviewed: Allergy & Precautions, NPO status , Patient's Chart, lab work & pertinent test results  Airway Mallampati: III  TM Distance: >3 FB Neck ROM: Full    Dental no notable dental hx. (+) Teeth Intact, Dental Advisory Given   Pulmonary COPD,  COPD inhaler, former smoker,    Pulmonary exam normal breath sounds clear to auscultation       Cardiovascular hypertension, Pt. on medications Normal cardiovascular exam Rhythm:Regular Rate:Normal     Neuro/Psych  Headaches, PSYCHIATRIC DISORDERS Anxiety Depression Bipolar Disorder H/o MS, uses walker at home, b/l LE numbness and weakness    GI/Hepatic Neg liver ROS, hiatal hernia, GERD  ,  Endo/Other  negative endocrine ROS  Renal/GU negative Renal ROS  negative genitourinary   Musculoskeletal  (+) Arthritis ,   Abdominal   Peds  Hematology negative hematology ROS (+)   Anesthesia Other Findings   Reproductive/Obstetrics                            Anesthesia Physical Anesthesia Plan  ASA: 3  Anesthesia Plan: General   Post-op Pain Management:    Induction: Intravenous  PONV Risk Score and Plan: 3 and Midazolam, Dexamethasone and Ondansetron  Airway Management Planned: Oral ETT  Additional Equipment:   Intra-op Plan:   Post-operative Plan: Extubation in OR  Informed Consent: I have reviewed the patients History and Physical, chart, labs and discussed the procedure including the risks, benefits and alternatives for the proposed anesthesia with the patient or authorized representative who has indicated his/her understanding and acceptance.     Dental advisory given  Plan Discussed with: CRNA  Anesthesia Plan Comments:         Anesthesia Quick Evaluation

## 2021-01-12 NOTE — Anesthesia Postprocedure Evaluation (Signed)
Anesthesia Post Note  Patient: Angel Maddox  Procedure(s) Performed: NASAL SEPTOPLASTY WITH BILATERAL  TURBINATE REDUCTION (Bilateral: Nose)     Patient location during evaluation: PACU Anesthesia Type: General Level of consciousness: awake and alert Pain management: pain level controlled Vital Signs Assessment: post-procedure vital signs reviewed and stable Respiratory status: spontaneous breathing, nonlabored ventilation, respiratory function stable and patient connected to nasal cannula oxygen Cardiovascular status: blood pressure returned to baseline and stable Postop Assessment: no apparent nausea or vomiting Anesthetic complications: no   No notable events documented.  Last Vitals:  Vitals:   01/12/21 1030 01/12/21 1045  BP: (!) 146/95 (!) 147/93  Pulse: 71 72  Resp: 12 13  Temp:    SpO2: 95% 96%    Last Pain:  Vitals:   01/12/21 1045  TempSrc:   PainSc: 2                  [name]

## 2021-01-12 NOTE — H&P (Signed)
Cc: Chronic nasal obstruction  HPI: The patient is a 65 year old female who returns today for her follow-up evaluation. The patient was previously seen for recurrent sinus infection and chronic nasal obstruction. At her last visit 4 weeks ago, she was noted to have chronic rhinitis, nasal mucosal congestion, nasal septal deviation and bilateral inferior turbinate hypertrophy.  No acute infection was noted at that time. The patient was continued on her Flonase and Azelastine nasal sprays.  She subsequently underwent a sinus CT scan. The CT showed no significant acute or chronic sinusitis.  However, significant nasal septal deviation and bilateral inferior turbinate hypertrophy were again noted. The patient returns today complaining of persistent symptoms.  She continues to have difficulty breathing through her nostrils.  She also complains of chronic facial pressure and discomfort.    Exam: General: Communicates without difficulty, well nourished, no acute distress. Head: Normocephalic, no evidence injury, no tenderness, facial buttresses intact without stepoff. Eyes: PERRL, EOMI. No scleral icterus, conjunctivae clear. Neuro: CN II exam reveals vision grossly intact.  No nystagmus at any point of gaze. Ears: Auricles well formed without lesions.  Ear canals are intact without mass or lesion.  No erythema or edema is appreciated.  The TMs are intact without fluid. Nose: External evaluation reveals normal support and skin without lesions.  Dorsum is intact.  Anterior rhinoscopy reveals congested and edematous mucosa over anterior aspect of the inferior turbinates and nasal septum.  No purulence is noted. Middle meatus is not well visualized. Oral:  Oral cavity and oropharynx are intact, symmetric, without erythema or edema.  Mucosa is moist without lesions. Neck: Full range of motion without pain.  There is no significant lymphadenopathy.  No masses palpable.  Thyroid bed within normal limits to palpation.   Parotid glands and submandibular glands equal bilaterally without mass.  Trachea is midline. Neuro:  CN 2-12 grossly intact. Gait with walker. Vestibular: No nystagmus at any point of gaze. A flexible scope was inserted into the right nasal cavity.  Endoscopy of the interior nasal cavity, superior, inferior, and middle meatus was performed. The sphenoid-ethmoid recess was examined. Edematous mucosa was noted.  No polyp, mass, or lesion was appreciated. Nasal septal deviation noted.  Olfactory cleft was clear.  Nasopharynx was clear.  Turbinates were hypertrophied but without mass. The procedure was repeated on the contralateral side with similar findings.    Assessment: 1.  Chronic rhinitis with nasal mucosal congestion, nasal septal deviation and bilateral inferior turbinate hypertrophy.  More than 95% of her nasal passageways are obstructed bilaterally.   2.  No polyps, mass, lesions or acute infection is noted today.   Plan: 1.  The nasal endoscopy findings and the CT images are reviewed with the patient.  2.  Continue with Flonase and Azelastine nasal sprays.   3.  Based on her persistent symptoms, she may benefit from surgical intervention with septoplasty and bilateral turbinate reduction.  The risks, benefits, alternatives and details of the procedures are reviewed with the patient.  4.  The patient would like to proceed with the procedures.

## 2021-01-13 NOTE — Progress Notes (Signed)
Pt states her nose is bleeding a lot and she told Dr Deeann Saint office/nurse who told her it was normal. Pt stated it has not stopped bleeding still and she is changing the guaze frequently. I instructed her to call drs office again if it continues to bleed.

## 2021-01-14 ENCOUNTER — Encounter (HOSPITAL_BASED_OUTPATIENT_CLINIC_OR_DEPARTMENT_OTHER): Payer: Self-pay | Admitting: Otolaryngology

## 2021-01-30 ENCOUNTER — Other Ambulatory Visit: Payer: Self-pay | Admitting: Gastroenterology

## 2021-01-30 DIAGNOSIS — K259 Gastric ulcer, unspecified as acute or chronic, without hemorrhage or perforation: Secondary | ICD-10-CM

## 2021-02-02 DIAGNOSIS — R296 Repeated falls: Secondary | ICD-10-CM | POA: Diagnosis not present

## 2021-02-02 DIAGNOSIS — R2689 Other abnormalities of gait and mobility: Secondary | ICD-10-CM | POA: Diagnosis not present

## 2021-02-02 DIAGNOSIS — R42 Dizziness and giddiness: Secondary | ICD-10-CM | POA: Diagnosis not present

## 2021-02-02 DIAGNOSIS — M7918 Myalgia, other site: Secondary | ICD-10-CM | POA: Diagnosis not present

## 2021-02-02 DIAGNOSIS — G4710 Hypersomnia, unspecified: Secondary | ICD-10-CM | POA: Diagnosis not present

## 2021-02-02 DIAGNOSIS — F339 Major depressive disorder, recurrent, unspecified: Secondary | ICD-10-CM | POA: Diagnosis not present

## 2021-02-02 DIAGNOSIS — D849 Immunodeficiency, unspecified: Secondary | ICD-10-CM | POA: Diagnosis not present

## 2021-02-02 DIAGNOSIS — D72819 Decreased white blood cell count, unspecified: Secondary | ICD-10-CM | POA: Diagnosis not present

## 2021-02-02 DIAGNOSIS — G35 Multiple sclerosis: Secondary | ICD-10-CM | POA: Diagnosis not present

## 2021-02-23 ENCOUNTER — Telehealth: Payer: Self-pay | Admitting: Gastroenterology

## 2021-02-23 ENCOUNTER — Other Ambulatory Visit: Payer: Self-pay | Admitting: Gastroenterology

## 2021-02-23 DIAGNOSIS — K259 Gastric ulcer, unspecified as acute or chronic, without hemorrhage or perforation: Secondary | ICD-10-CM

## 2021-02-23 MED ORDER — OMEPRAZOLE 40 MG PO CPDR: 40.0000 mg | DELAYED_RELEASE_CAPSULE | Freq: Every day | ORAL | 1 refills | Status: DC

## 2021-02-23 NOTE — Telephone Encounter (Signed)
Omeprazole 40 mg qd filled until October appt

## 2021-03-03 ENCOUNTER — Encounter (HOSPITAL_COMMUNITY): Payer: Medicare PPO

## 2021-03-11 ENCOUNTER — Inpatient Hospital Stay (HOSPITAL_COMMUNITY): Admission: RE | Admit: 2021-03-11 | Payer: Medicare PPO | Source: Ambulatory Visit

## 2021-03-12 ENCOUNTER — Other Ambulatory Visit (HOSPITAL_COMMUNITY): Payer: Self-pay | Admitting: *Deleted

## 2021-03-15 ENCOUNTER — Other Ambulatory Visit: Payer: Self-pay

## 2021-03-15 ENCOUNTER — Ambulatory Visit (HOSPITAL_COMMUNITY)
Admission: RE | Admit: 2021-03-15 | Discharge: 2021-03-15 | Disposition: A | Discharge status: Home / Self Care | Payer: Medicare PPO | Source: Ambulatory Visit | Attending: Psychiatry | Admitting: Psychiatry

## 2021-03-15 DIAGNOSIS — G35 Multiple sclerosis: Secondary | ICD-10-CM | POA: Diagnosis not present

## 2021-03-15 MED ORDER — ACETAMINOPHEN 500 MG PO TABS
ORAL_TABLET | ORAL | Status: AC
  Administered 2021-03-15: 1000 mg via ORAL
  Filled 2021-03-15: qty 2

## 2021-03-15 MED ORDER — DIPHENHYDRAMINE HCL 50 MG/ML IJ SOLN
INTRAMUSCULAR | Status: AC
  Administered 2021-03-15: 25 mg via INTRAVENOUS
  Filled 2021-03-15: qty 1

## 2021-03-15 MED ORDER — DIPHENHYDRAMINE HCL 50 MG/ML IJ SOLN
25.0000 mg | INTRAMUSCULAR | Status: DC
Start: 2021-03-15 — End: 2021-03-16

## 2021-03-15 MED ORDER — METHYLPREDNISOLONE SODIUM SUCC 125 MG IJ SOLR: 125.0000 mg | INTRAMUSCULAR | Status: DC

## 2021-03-15 MED ORDER — RITUXIMAB CHEMO INJECTION 500 MG/50ML
1000.0000 mg | INTRAVENOUS | Status: DC
  Administered 2021-03-15: 1000 mg via INTRAVENOUS
  Filled 2021-03-15: qty 100

## 2021-03-15 MED ORDER — ACETAMINOPHEN 500 MG PO TABS
1000.0000 mg | ORAL_TABLET | ORAL | Status: DC
Start: 2021-03-15 — End: 2021-03-16

## 2021-03-15 MED ORDER — METHYLPREDNISOLONE SODIUM SUCC 125 MG IJ SOLR
INTRAMUSCULAR | Status: AC
  Administered 2021-03-15: 125 mg via INTRAVENOUS
  Filled 2021-03-15: qty 2

## 2021-03-19 ENCOUNTER — Other Ambulatory Visit: Payer: Self-pay | Admitting: Gastroenterology

## 2021-03-19 DIAGNOSIS — K259 Gastric ulcer, unspecified as acute or chronic, without hemorrhage or perforation: Secondary | ICD-10-CM

## 2021-03-20 ENCOUNTER — Other Ambulatory Visit: Payer: Self-pay | Admitting: Physician Assistant

## 2021-03-25 DIAGNOSIS — Z23 Encounter for immunization: Secondary | ICD-10-CM | POA: Diagnosis not present

## 2021-03-29 ENCOUNTER — Ambulatory Visit (HOSPITAL_COMMUNITY)
Admission: RE | Admit: 2021-03-29 | Discharge: 2021-03-29 | Disposition: A | Discharge status: Home / Self Care | Payer: Medicare PPO | Source: Ambulatory Visit | Attending: Psychiatry | Admitting: Psychiatry

## 2021-03-29 ENCOUNTER — Other Ambulatory Visit: Payer: Self-pay

## 2021-03-29 DIAGNOSIS — G35 Multiple sclerosis: Secondary | ICD-10-CM | POA: Diagnosis not present

## 2021-03-29 MED ORDER — METHYLPREDNISOLONE SODIUM SUCC 125 MG IJ SOLR
INTRAMUSCULAR | Status: AC
  Filled 2021-03-29: qty 2

## 2021-03-29 MED ORDER — METHYLPREDNISOLONE SODIUM SUCC 125 MG IJ SOLR
125.0000 mg | INTRAMUSCULAR | Status: AC
Start: 2021-03-29 — End: 2021-03-29
  Administered 2021-03-29: 125 mg via INTRAVENOUS

## 2021-03-29 MED ORDER — RITUXIMAB CHEMO INJECTION 500 MG/50ML
1000.0000 mg | INTRAVENOUS | Status: AC
  Administered 2021-03-29: 1000 mg via INTRAVENOUS
  Filled 2021-03-29: qty 100

## 2021-03-29 MED ORDER — DIPHENHYDRAMINE HCL 50 MG/ML IJ SOLN
25.0000 mg | INTRAMUSCULAR | Status: AC
Start: 2021-03-29 — End: 2021-03-29
  Administered 2021-03-29: 25 mg via INTRAVENOUS

## 2021-03-29 MED ORDER — DIPHENHYDRAMINE HCL 50 MG/ML IJ SOLN
INTRAMUSCULAR | Status: AC
  Filled 2021-03-29: qty 1

## 2021-03-29 MED ORDER — ACETAMINOPHEN 500 MG PO TABS
ORAL_TABLET | ORAL | Status: AC
  Filled 2021-03-29: qty 2

## 2021-03-29 MED ORDER — ACETAMINOPHEN 500 MG PO TABS
1000.0000 mg | ORAL_TABLET | ORAL | Status: AC
  Administered 2021-03-29: 1000 mg via ORAL

## 2021-04-21 ENCOUNTER — Ambulatory Visit: Payer: Medicare PPO | Admitting: Gastroenterology

## 2021-04-21 ENCOUNTER — Encounter: Payer: Self-pay | Admitting: Gastroenterology

## 2021-04-21 VITALS — BP 130/78 | HR 68 | Ht 67.0 in | Wt 129.8 lb

## 2021-04-21 DIAGNOSIS — R1013 Epigastric pain: Secondary | ICD-10-CM | POA: Diagnosis not present

## 2021-04-21 DIAGNOSIS — Z79899 Other long term (current) drug therapy: Secondary | ICD-10-CM | POA: Diagnosis not present

## 2021-04-21 DIAGNOSIS — R194 Change in bowel habit: Secondary | ICD-10-CM | POA: Diagnosis not present

## 2021-04-21 DIAGNOSIS — K219 Gastro-esophageal reflux disease without esophagitis: Secondary | ICD-10-CM

## 2021-04-21 DIAGNOSIS — Z8711 Personal history of peptic ulcer disease: Secondary | ICD-10-CM | POA: Diagnosis not present

## 2021-04-21 DIAGNOSIS — R109 Unspecified abdominal pain: Secondary | ICD-10-CM

## 2021-04-21 MED ORDER — OMEPRAZOLE 40 MG PO CPDR: 40.0000 mg | DELAYED_RELEASE_CAPSULE | Freq: Every day | ORAL | 1 refills | Status: DC

## 2021-04-21 MED ORDER — DICYCLOMINE HCL 10 MG PO CAPS: 10.0000 mg | ORAL_CAPSULE | Freq: Three times a day (TID) | ORAL | 3 refills | Status: DC | PRN

## 2021-04-21 NOTE — Patient Instructions (Addendum)
If you are age 65 or older, your body mass index should be between 23-30. Your Body mass index is 20.33 kg/m. If this is out of the aforementioned range listed, please consider follow up with your Primary Care Provider.  If you are age 34 or younger, your body mass index should be between 19-25. Your Body mass index is 20.33 kg/m. If this is out of the aformentioned range listed, please consider follow up with your Primary Care Provider.   ________________________________________________________  The [facility] providers would like to encourage you to use [facility] to communicate with providers for non-urgent requests or questions.  Due to long hold times on the telephone, sending your provider a message by [facility] may be a faster and more efficient way to get a response.  Please allow 48 business hours for a response.  Please remember that this is for non-urgent requests.  _______________________________________________________  Continue omeprazole 40 mg once daily.  We have sent the following medications to your pharmacy for you to pick up at your convenience: Bentyl 10 mg: Take one tablet by mouth every 8 hours as needed  Continue Metamucil.  Please follow up in 6 months.  You will be due for a recall colonoscopy in 03-2024. We will send you a reminder in the mail when it gets closer to that time.  Thank you for entrusting me with your care and for choosing [facility], Dr. [place] Cellar

## 2021-04-21 NOTE — Progress Notes (Signed)
HPI :  65 year old female here for follow-up visit for GERD, history of gastric ulcer, history of colon polyps, altered bowel habits.  She has had a history of intermittent nausea and pyrosis for some time.  She had not done well with Pepcid monotherapy.  She had intolerance to Protonix in the past.  She had an EGD with me last in July 2020.  She had a small hiatal hernia and some gastritis with a diminutive ulcer in the antrum.  Biopsies negative for H. pylori.  She was transitioned to omeprazole 40 mg daily at that time.  She states this really helped resolve her reflux symptoms and she is quite happy with the regimen.  This is been the best regimen for her over time.  She does have a history of osteoporosis and we discussed long-term plan.  She is eating pretty well, she has gained a few pounds since have last seen her.  She denies any dysphagia.  She was empirically dilated and had a normal-appearing esophagus on her last EGD.  She does have some occasional postprandial upper abdominal discomfort, usually at night.  She states she feels better if she passes gas.  No vomiting.  She states symptoms are mild and not too bothersome.  She also has some lower abdominal discomfort that is relieved with a bowel movement.    She has had some altered bowel habits since of last seen her.  She has a few bowel movements a day which are usually formed but soft and occasionally loose.  Previously she was having very loose stools with some accidents.  Her gallbladder is in place.  She states Imodium did not work too well for her but she has not had much better results with Metamucil daily.  She is pretty happy with the regimen but still having some occasional cramping in her abdomen.  Raquel Sarna history of colon cancer.  Her last colonoscopy in 2018 showed 1 small adenoma.  She does have a history of multiple sclerosis on Rituxan for that.  She had COVID-pneumonia last year and states she nearly died, she was intubated in  the ICU and had a prolonged hospitalization, had to recover in a nursing home.  She has recovered from that but states she remains frail. She is generally pretty happy with the regimen she is on for now.     Colonoscopy 04/17/2017 - Tortuous colon. - One 6 mm polyp in the sigmoid colon, removed with a cold snare. Resected and retrieved. - adenoma - Internal hemorrhoids. - The examination was otherwise normal.   EGD 2008, normal   CT scan 06/25/2018 - R adrenal adenoma 2.8cm, R ovarian cyst 3.2cm, normal liver   EGD 01/24/2019 -  - A 1 cm hiatal hernia was present. - The exam of the esophagus was otherwise normal. No stenosis / stricture appreciated. - A guidewire was placed and the scope was withdrawn. Empiric dilation was performed in the entire esophagus with a Savary dilator with mild resistance at 17 mm. Relook endoscopy showed no mucosal wrents. Biopsies were taken with a cold forceps in the upper third of the esophagus, in the middle third of the esophagus and in the lower third of the esophagus for histology. - One non-bleeding superficial clean based gastric ulcer with no stigmata of bleeding was found at the pylorus. The lesion was 3 mm in largest dimension. - Patchy moderate inflammation characterized by erosions, erythema and friability was found in the gastric antrum. - The exam of the  stomach was otherwise normal. - Biopsies were taken with a cold forceps in the gastric body, at the incisura and in the gastric antrum for Helicobacter pylori testing. - The duodenal bulb and second portion of the duodenum were normal.   1. Surgical [P], gastric antrum and gastric body - MILD REACTIVE GASTROPATHY. [place] Dyer IS NEGATIVE FOR HELICOBACTER PYLORI. - NO INTESTINAL METAPLASIA, DYSPLASIA, OR MALIGNANCY. 2. Surgical [P], distal, mid, proximal esophagus - BENIGN SQUAMOUS MUCOSA. - NO INCREASE IN INTRAEPITHELIAL EOSINOPHILS. - NO INTESTINAL METAPLASIA, DYSPLASIA, OR  MALIGNANCY.    Past Medical History:  Diagnosis Date   Anxiety    Arthritis    osteo   Bipolar 1 disorder (HCC)    Cervicalgia    Chronic kidney disease    kidney stone   COPD (chronic obstructive pulmonary disease) (HCC)    new diagnosis- now sees pulmonologist   Depression    Deviated septum    Elevated liver enzymes    She says "normal now"   GERD (gastroesophageal reflux disease)    H/O hiatal hernia    Headache    Hip fracture ([place]) 2017   left   History of kidney stones    4-5 yrs ago   Hypertension    Multiple sclerosis ([place])     Had for 15 years   Multiple sclerosis ([place])    dx 1999   Tremors of nervous system      Past Surgical History:  Procedure Laterality Date   ANTERIOR CERVICAL DECOMP/DISCECTOMY FUSION N/A 07/28/2017   Procedure: CERVICAL FOUR-FIVE, CERVICAL FIVE-SIX ANTERIOR CERVICAL DECOMPRESSION/DISCECTOMY FUSION;  Surgeon: Eustace Moore, MD;  Location: [facility];  Service: Neurosurgery;  Laterality: N/A;   COLONOSCOPY  2008   Dr.Kaplan   EYE SURGERY     [facility]   HIP PINNING,CANNULATED Left 11/09/2015   Procedure: CANNULATED HIP PINNING;  Surgeon: Rod Can, MD;  Location: [facility];  Service: Orthopedics;  Laterality: Left;   NASAL SEPTOPLASTY W/ TURBINOPLASTY Bilateral 01/12/2021   Procedure: NASAL SEPTOPLASTY WITH BILATERAL  TURBINATE REDUCTION;  Surgeon: Leta Baptist, MD;  Location: [facility];  Service: ENT;  Laterality: Bilateral;   TUBAL LIGATION     Family History  Problem Relation Age of Onset   Heart attack Mother    Breast cancer Maternal Aunt    Colon cancer Neg Hx    Stomach cancer Neg Hx    Esophageal cancer Neg Hx    Rectal cancer Neg Hx    Social History   Tobacco Use   Smoking status: Former    Packs/day: 0.25    Years: 15.00    Pack years: 3.75    Types: Cigarettes    Quit date: 01/25/1991    Years since quitting: 30.2   Smokeless tobacco: Never   Tobacco comments:    NO SMOKING at  All  Vaping Use   Vaping Use: Never used  Substance Use Topics   Alcohol use: No   Drug use: No   Current Outpatient Medications  Medication Sig Dispense Refill   amLODipine (NORVASC) 10 MG tablet Take 1 tablet (10 mg total) by mouth daily. 90 tablet 1   baclofen (LIORESAL) 10 MG tablet Take 10 mg by mouth 3 (three) times daily.     buPROPion (WELLBUTRIN SR) 200 MG 12 hr tablet Take 1 tablet (200 mg total) by mouth 2 (two) times daily. 60 tablet 11   citalopram (CELEXA) 40 MG tablet TAKE 1 TABLET BY MOUTH EVERY DAY  30 tablet 3   clonazePAM (KLONOPIN) 1 MG disintegrating tablet 1 mg at bedtime.     dalfampridine 10 MG TB12 2 (two) times daily.     gabapentin (NEURONTIN) 300 MG capsule Take 2 capsules (600 mg total) by mouth 3 (three) times daily. 180 capsule 0   losartan (COZAAR) 25 MG tablet TAKE 1 TABLET BY MOUTH EVERY DAY 90 tablet 1   mirtazapine (REMERON) 15 MG tablet Take 0.5-1 tablets (7.5-15 mg total) by mouth at bedtime as needed. 30 tablet 11   omeprazole (PRILOSEC) 40 MG capsule TAKE 1 CAPSULE BY MOUTH DAILY BEFORE BREAKFAST- KEEP OCT APPT FOR MORE REFILLS 30 capsule 0   ondansetron (ZOFRAN-ODT) 4 MG disintegrating tablet TAKE 1 TABLET BY MOUTH EVERY 8 HOURS AS NEEDED FOR NAUSEA AND VOMITING 30 tablet 2   primidone (MYSOLINE) 250 MG tablet Take 250 mg by mouth 3 (three) times daily.     psyllium (METAMUCIL) 58.6 % powder Take 1 packet by mouth daily.     riTUXimab (RITUXAN) 100 MG/10ML injection Inject into the vein.     tiZANidine (ZANAFLEX) 4 MG capsule      No current facility-administered medications for this visit.   Allergies  Allergen Reactions   Hydrocodone Itching     Review of Systems: All systems reviewed and negative except where noted in HPI.    Lab Results  Component Value Date   WBC 4.7 12/28/2018   HGB 13.0 12/28/2018   HCT 38.8 12/28/2018   MCV 98.0 12/28/2018   PLT 308 12/28/2018    Lab Results  Component Value Date   CREATININE 1.00  07/22/2019   BUN 9 07/22/2019   NA 138 07/22/2019   K 4.5 07/22/2019   CL 98 07/22/2019   CO2 25 07/22/2019     Physical Exam: BP 130/78   Pulse 68   Ht 5\' 7"  (1.702 m)   Wt 129 lb 12.8 oz (58.9 kg)   BMI 20.33 kg/m  Constitutional: Pleasant,well-developed, female in no acute distress. Abdominal: Soft, nondistended, nontender.  There are no masses palpable.  Extremities: no edema Lymphadenopathy: No cervical adenopathy noted. Neurological: Alert and oriented to person place and time. Skin: Skin is warm and dry. No rashes noted. Psychiatric: Normal mood and affect. Behavior is normal.   ASSESSMENT AND PLAN: 65 year old female here for reassessment of following:  Altered bowel habits Abdominal cramping GERD History of gastric ulcer Dyspepsia Long-term use of PPI  History as outlined above.  Colonoscopy is up-to-date, she has 1 small polyp on her last exam in 2018, I think okay to change her recall to 2025.  We discussed her bowel changes on Metamucil definitely has helped provide some regularity and she is happy on the regimen.  We will give her some Bentyl to use as needed for abdominal cramps and occasional loose stools to see if that will help further.  She can follow-up as needed for this issue  We discussed her upper tract symptoms which have significantly been improved with omeprazole 40 mg once daily.  Last EGD as above.  She had a very diminutive ulcer and seems very low risk, we had not pursued follow-up surveillance EGD.  She generally has been eating well and symptoms are mild  She does have some occasional dyspepsia she states reliably relieved with passing gas.  I offered her follow-up surveillance EGD in light of some of the symptoms and she declines for now.  I discussed long-term risks of chronic PPI use.  She  understands her risk for bone fracture is elevated especially in light of her osteoporosis.  We could consider reducing her dose of omeprazole however she  feels like she cannot reduce it without having some breakthrough symptoms and wants to continue it for now as it really helps her.  She understands the long-term risks of PPI.  I will see her back in the office in 6 months, she contact me in the interim with any worsening or recurrent symptoms  Plan: - continue omeprazole 40mg  - discussed risks/ benefits, she wishes to continue.  - offered EGD in light of history of PUD, she declines - trial of bentyl 10mg  every 8 hours PRN  - continue metamucil - change recall for colonoscopy to 03/2024 - follow up in 6 months for reassessment, call sooner if any issues  Jolly Mango, MD [facility]

## 2021-05-03 ENCOUNTER — Encounter (HOSPITAL_COMMUNITY): Payer: Medicare PPO

## 2021-05-11 DIAGNOSIS — J310 Chronic rhinitis: Secondary | ICD-10-CM | POA: Diagnosis not present

## 2021-05-11 DIAGNOSIS — R0982 Postnasal drip: Secondary | ICD-10-CM | POA: Diagnosis not present

## 2021-05-11 DIAGNOSIS — J343 Hypertrophy of nasal turbinates: Secondary | ICD-10-CM | POA: Diagnosis not present

## 2021-05-12 DIAGNOSIS — Z1339 Encounter for screening examination for other mental health and behavioral disorders: Secondary | ICD-10-CM | POA: Diagnosis not present

## 2021-05-12 DIAGNOSIS — Z7189 Other specified counseling: Secondary | ICD-10-CM | POA: Diagnosis not present

## 2021-05-12 DIAGNOSIS — Z299 Encounter for prophylactic measures, unspecified: Secondary | ICD-10-CM | POA: Diagnosis not present

## 2021-05-12 DIAGNOSIS — Z0000 Encounter for general adult medical examination without abnormal findings: Secondary | ICD-10-CM | POA: Diagnosis not present

## 2021-05-12 DIAGNOSIS — R5383 Other fatigue: Secondary | ICD-10-CM | POA: Diagnosis not present

## 2021-05-12 DIAGNOSIS — I10 Essential (primary) hypertension: Secondary | ICD-10-CM | POA: Diagnosis not present

## 2021-05-12 DIAGNOSIS — E7800 Pure hypercholesterolemia, unspecified: Secondary | ICD-10-CM | POA: Diagnosis not present

## 2021-05-12 DIAGNOSIS — Z681 Body mass index (BMI) 19 or less, adult: Secondary | ICD-10-CM | POA: Diagnosis not present

## 2021-05-12 DIAGNOSIS — Z1331 Encounter for screening for depression: Secondary | ICD-10-CM | POA: Diagnosis not present

## 2021-06-03 ENCOUNTER — Ambulatory Visit: Payer: Medicare PPO | Admitting: Physician Assistant

## 2021-06-03 ENCOUNTER — Encounter: Payer: Self-pay | Admitting: Physician Assistant

## 2021-06-03 ENCOUNTER — Other Ambulatory Visit: Payer: Self-pay

## 2021-06-03 DIAGNOSIS — F411 Generalized anxiety disorder: Secondary | ICD-10-CM

## 2021-06-03 DIAGNOSIS — G4700 Insomnia, unspecified: Secondary | ICD-10-CM

## 2021-06-03 DIAGNOSIS — F3341 Major depressive disorder, recurrent, in partial remission: Secondary | ICD-10-CM | POA: Diagnosis not present

## 2021-06-03 MED ORDER — CITALOPRAM HYDROBROMIDE 40 MG PO TABS: 40.0000 mg | ORAL_TABLET | Freq: Every day | ORAL | 11 refills | Status: DC

## 2021-06-03 NOTE — Progress Notes (Signed)
[facility] Med Check  Patient ID: Angel Maddox,  MRN: [id number]  PCP: Monico Blitz, MD  Date of Evaluation: 06/03/2021 Time spent:20 minutes   HISTORY/CURRENT STATUS: For routine med check.    Has started working out. Also taking chair aerobics. Has gotten a lot stronger and is walking better.  Multiple sclerosis is well controlled.   Feels really good with her psych meds. Not needing mirtazepine now.  Sleeps good most of the time.  Able to enjoy things.  Energy and motivation are better since she has been working out now.  Does not cry easily.  No suicidal or homicidal thoughts.  Patient denies increased energy with decreased need for sleep, no increased talkativeness, no racing thoughts, no impulsivity or risky behaviors, no increased spending, no increased libido, no grandiosity, no increased irritability or anger, no paranoia, and no hallucinations.  She still has anxiety at times but does not need the Klonopin very often.  Her husband still manages her medications.  Individual Medical History/ Review of Systems: Changes? :Yes    Had nose surgery, deviated symptom and a spur removed.   Past medications for mental health diagnoses include: Depakote, Lamictal, Risperdal, Celexa, Klonopin, trazodone, Wellbutrin, Vistaril, primidone, gabapentin, Prozac, Zyprexa, Ambien, Lunesta, Effexor  Allergies: Hydrocodone  Current Medications:  Current Outpatient Medications:    amLODipine (NORVASC) 10 MG tablet, Take 1 tablet (10 mg total) by mouth daily., Disp: 90 tablet, Rfl: 1   baclofen (LIORESAL) 10 MG tablet, Take 10 mg by mouth 3 (three) times daily., Disp: , Rfl:    buPROPion (WELLBUTRIN SR) 200 MG 12 hr tablet, Take 1 tablet (200 mg total) by mouth 2 (two) times daily., Disp: 60 tablet, Rfl: 11   clonazePAM (KLONOPIN) 1 MG disintegrating tablet, 1 mg at bedtime., Disp: , Rfl:    dalfampridine 10 MG TB12, 2 (two) times daily., Disp: , Rfl:    dicyclomine (BENTYL) 10 MG  capsule, Take 1 capsule (10 mg total) by mouth every 8 (eight) hours as needed for spasms., Disp: 30 capsule, Rfl: 3   gabapentin (NEURONTIN) 300 MG capsule, Take 2 capsules (600 mg total) by mouth 3 (three) times daily., Disp: 180 capsule, Rfl: 0   losartan (COZAAR) 25 MG tablet, TAKE 1 TABLET BY MOUTH EVERY DAY, Disp: 90 tablet, Rfl: 1   mirtazapine (REMERON) 15 MG tablet, Take 0.5-1 tablets (7.5-15 mg total) by mouth at bedtime as needed., Disp: 30 tablet, Rfl: 11   omeprazole (PRILOSEC) 40 MG capsule, Take 1 capsule (40 mg total) by mouth daily., Disp: 90 capsule, Rfl: 1   primidone (MYSOLINE) 250 MG tablet, Take 250 mg by mouth 3 (three) times daily., Disp: , Rfl:    psyllium (METAMUCIL) 58.6 % powder, Take 1 packet by mouth daily., Disp: , Rfl:    riTUXimab (RITUXAN) 100 MG/10ML injection, Inject into the vein., Disp: , Rfl:    tiZANidine (ZANAFLEX) 4 MG capsule, , Disp: , Rfl:    citalopram (CELEXA) 40 MG tablet, Take 1 tablet (40 mg total) by mouth daily., Disp: 30 tablet, Rfl: 11   ondansetron (ZOFRAN-ODT) 4 MG disintegrating tablet, TAKE 1 TABLET BY MOUTH EVERY 8 HOURS AS NEEDED FOR NAUSEA AND VOMITING (Patient not taking: Reported on 06/03/2021), Disp: 30 tablet, Rfl: 2 Medication Side Effects: none  Family Medical/ Social History: Changes? No  MENTAL HEALTH EXAM:  There were no vitals taken for this visit.There is no height or weight on file to calculate BMI.  General Appearance: Casual, Neat and Well Groomed  Eye Contact:  Good  Speech:  Clear and Coherent and Normal Rate  Volume:  Normal  Mood:  Euthymic  Affect:  Appropriate  Thought Process:  Goal Directed and Descriptions of Associations: Intact  Orientation:  Full (Time, Place, and Person)  Thought Content: Logical   Suicidal Thoughts:  No  Homicidal Thoughts:  No  Memory:  WNL  Judgement:  Good  Insight:  Good  Psychomotor Activity:  Normal  Concentration:  Concentration: Good  Recall:  Good  Fund of Knowledge:  Good  Language: Good  Assets:  Desire for Improvement  ADLs   Cognition: WNL  Prognosis:  Good    DIAGNOSES:    ICD-10-CM   1. Recurrent major depressive disorder, in partial remission ([place])  F33.41     2. Generalized anxiety disorder  F41.1     3. Insomnia, unspecified type  G47.00        Receiving Psychotherapy: Yes  [name], LCSW   RECOMMENDATIONS:  PDMP was reviewed.  Klonopin filled 02/02/2021.  Oxycodone known to me. I provided 20 minutes of face to face time during this encounter, including time spent before and after the visit in records review, medical decision making, counseling pertinent to today's visit, and charting.  I am glad to see her doing so well!  No changes in medications are needed. Per patient request and due to history of overdose, only give a 30-day supply, vs 90 days, at a time when refills are due. Continue Wellbutrin SR 200 mg, 1 p.o. twice daily. Continue Celexa 40 mg, 1 p.o. daily. Continue Klonopin 0.5 mg as directed per neurology. Continue mirtazapine 15 mg, 1/2-1 p.o. nightly as needed sleep. Return in 6 months.  Donnal Moat, PA-C

## 2021-06-23 ENCOUNTER — Encounter: Payer: Self-pay | Admitting: Dermatology

## 2021-06-23 ENCOUNTER — Ambulatory Visit (INDEPENDENT_AMBULATORY_CARE_PROVIDER_SITE_OTHER): Payer: Medicare PPO | Admitting: Dermatology

## 2021-06-23 ENCOUNTER — Other Ambulatory Visit: Payer: Self-pay

## 2021-06-23 DIAGNOSIS — Z1283 Encounter for screening for malignant neoplasm of skin: Secondary | ICD-10-CM | POA: Diagnosis not present

## 2021-06-23 DIAGNOSIS — L821 Other seborrheic keratosis: Secondary | ICD-10-CM | POA: Diagnosis not present

## 2021-06-23 DIAGNOSIS — L738 Other specified follicular disorders: Secondary | ICD-10-CM | POA: Diagnosis not present

## 2021-06-23 DIAGNOSIS — L989 Disorder of the skin and subcutaneous tissue, unspecified: Secondary | ICD-10-CM

## 2021-06-23 MED ORDER — MUPIROCIN 2 % EX OINT: 1.0000 "application " | TOPICAL_OINTMENT | Freq: Two times a day (BID) | CUTANEOUS | 0 refills | Status: DC

## 2021-06-29 ENCOUNTER — Encounter (HOSPITAL_COMMUNITY): Payer: Medicare PPO

## 2021-07-07 DIAGNOSIS — R2689 Other abnormalities of gait and mobility: Secondary | ICD-10-CM | POA: Diagnosis not present

## 2021-07-07 DIAGNOSIS — D72819 Decreased white blood cell count, unspecified: Secondary | ICD-10-CM | POA: Diagnosis not present

## 2021-07-07 DIAGNOSIS — D849 Immunodeficiency, unspecified: Secondary | ICD-10-CM | POA: Diagnosis not present

## 2021-07-07 DIAGNOSIS — G35 Multiple sclerosis: Secondary | ICD-10-CM | POA: Diagnosis not present

## 2021-07-07 DIAGNOSIS — F339 Major depressive disorder, recurrent, unspecified: Secondary | ICD-10-CM | POA: Diagnosis not present

## 2021-07-23 ENCOUNTER — Encounter: Payer: Self-pay | Admitting: Dermatology

## 2021-07-23 DIAGNOSIS — Z299 Encounter for prophylactic measures, unspecified: Secondary | ICD-10-CM | POA: Diagnosis not present

## 2021-07-23 DIAGNOSIS — R059 Cough, unspecified: Secondary | ICD-10-CM | POA: Diagnosis not present

## 2021-07-23 DIAGNOSIS — Z681 Body mass index (BMI) 19 or less, adult: Secondary | ICD-10-CM | POA: Diagnosis not present

## 2021-07-23 DIAGNOSIS — U071 COVID-19: Secondary | ICD-10-CM | POA: Diagnosis not present

## 2021-07-23 DIAGNOSIS — Z87891 Personal history of nicotine dependence: Secondary | ICD-10-CM | POA: Diagnosis not present

## 2021-07-23 NOTE — Progress Notes (Signed)
° °  Follow-Up Visit   Subjective  Angel Maddox is a 66 y.o. female who presents for the following: Skin Problem (Lesion x months- left outer eye- seems to be getting bigger & right shoulder- bump).  General skin check, several areas of concern Location:  Duration:  Quality:  Associated Signs/Symptoms: Modifying Factors:  Severity:  Timing: Context:   Objective  Well appearing patient in no apparent distress; mood and affect are within normal limits. Torso - Posterior (Back) Waist up skin examination, no atypical pigmented lesions or nonmelanoma skin cancer.  Left Buccal Cheek, Mid Chin, Right Buccal Cheek Small yellow papules with a central dell.   Right Proximal Thumb Impetiginized papule  Left Zygomatic Area, Right Upper Back Flattopped textured brown 6 mm papules, typical dermoscopy    A full examination was performed including scalp, head, eyes, ears, nose, lips, neck, chest, axillae, abdomen, back, buttocks, bilateral upper extremities, bilateral lower extremities, hands, feet, fingers, toes, fingernails, and toenails. All findings within normal limits unless otherwise noted below.  Areas beneath undergarments not fully examined   Assessment & Plan    Screening exam for skin cancer Torso - Posterior (Back)  Annual skin examination, encouraged to self examine twice annually  Sebaceous gland hyperplasia Left Buccal Cheek; Right Buccal Cheek; Mid Chin  Benign, ok to leave Told of similar appearance of early [facility] so if the lesion grows or bleeds return for biopsy  Bumps on skin Right Proximal Thumb  Mupirocin BID   Related Medications mupirocin ointment (BACTROBAN) 2 % Apply 1 application topically 2 (two) times daily.  Seborrheic keratosis (2) Right Upper Back; Left Zygomatic Area  Recheck as needed change      I, Lavonna Monarch, MD, have reviewed all documentation for this visit.  The documentation on 07/23/21 for the exam, diagnosis,  procedures, and orders are all accurate and complete.

## 2021-07-30 ENCOUNTER — Encounter (HOSPITAL_COMMUNITY): Admission: RE | Admit: 2021-07-30 | Payer: Medicare PPO | Source: Ambulatory Visit

## 2021-07-30 DIAGNOSIS — G35 Multiple sclerosis: Secondary | ICD-10-CM | POA: Diagnosis not present

## 2021-07-30 DIAGNOSIS — R059 Cough, unspecified: Secondary | ICD-10-CM | POA: Diagnosis not present

## 2021-07-30 DIAGNOSIS — Z681 Body mass index (BMI) 19 or less, adult: Secondary | ICD-10-CM | POA: Diagnosis not present

## 2021-07-30 DIAGNOSIS — R5383 Other fatigue: Secondary | ICD-10-CM | POA: Diagnosis not present

## 2021-07-30 DIAGNOSIS — Z299 Encounter for prophylactic measures, unspecified: Secondary | ICD-10-CM | POA: Diagnosis not present

## 2021-07-30 DIAGNOSIS — U071 COVID-19: Secondary | ICD-10-CM | POA: Diagnosis not present

## 2021-07-30 DIAGNOSIS — Z87891 Personal history of nicotine dependence: Secondary | ICD-10-CM | POA: Diagnosis not present

## 2021-08-03 DIAGNOSIS — Z6820 Body mass index (BMI) 20.0-20.9, adult: Secondary | ICD-10-CM | POA: Diagnosis not present

## 2021-08-03 DIAGNOSIS — Z124 Encounter for screening for malignant neoplasm of cervix: Secondary | ICD-10-CM | POA: Diagnosis not present

## 2021-08-03 DIAGNOSIS — Z1231 Encounter for screening mammogram for malignant neoplasm of breast: Secondary | ICD-10-CM | POA: Diagnosis not present

## 2021-08-06 ENCOUNTER — Inpatient Hospital Stay (HOSPITAL_COMMUNITY): Admission: RE | Admit: 2021-08-06 | Payer: Medicare PPO | Source: Ambulatory Visit

## 2021-08-10 ENCOUNTER — Encounter: Payer: Self-pay | Admitting: Dermatology

## 2021-08-10 ENCOUNTER — Ambulatory Visit: Payer: Medicare PPO | Admitting: Dermatology

## 2021-08-10 ENCOUNTER — Encounter (HOSPITAL_COMMUNITY): Payer: Medicare PPO

## 2021-08-10 ENCOUNTER — Other Ambulatory Visit: Payer: Self-pay

## 2021-08-10 DIAGNOSIS — D225 Melanocytic nevi of trunk: Secondary | ICD-10-CM | POA: Diagnosis not present

## 2021-08-10 DIAGNOSIS — L820 Inflamed seborrheic keratosis: Secondary | ICD-10-CM

## 2021-08-10 DIAGNOSIS — L821 Other seborrheic keratosis: Secondary | ICD-10-CM

## 2021-08-10 DIAGNOSIS — D229 Melanocytic nevi, unspecified: Secondary | ICD-10-CM

## 2021-08-10 DIAGNOSIS — L570 Actinic keratosis: Secondary | ICD-10-CM | POA: Diagnosis not present

## 2021-08-10 DIAGNOSIS — D485 Neoplasm of uncertain behavior of skin: Secondary | ICD-10-CM

## 2021-08-10 HISTORY — DX: Melanocytic nevi, unspecified: D22.9

## 2021-08-10 NOTE — Patient Instructions (Signed)

## 2021-08-12 ENCOUNTER — Telehealth: Payer: Self-pay

## 2021-08-12 NOTE — Telephone Encounter (Signed)
Phone call to patient with her pathology results. Patient aware of results.  

## 2021-08-12 NOTE — Telephone Encounter (Signed)
-----   Message from Lavonna Monarch, MD sent at 08/12/2021  5:45 AM EST ----- Please schedule routine (Non-surgical) follow up for wider shave of abdominal lesion

## 2021-08-14 ENCOUNTER — Other Ambulatory Visit: Payer: Self-pay | Admitting: Physician Assistant

## 2021-08-18 ENCOUNTER — Other Ambulatory Visit: Payer: Self-pay | Admitting: Physician Assistant

## 2021-08-19 ENCOUNTER — Other Ambulatory Visit (HOSPITAL_COMMUNITY): Payer: Self-pay

## 2021-08-20 ENCOUNTER — Encounter (HOSPITAL_COMMUNITY)
Admission: RE | Admit: 2021-08-20 | Discharge: 2021-08-20 | Disposition: A | Discharge status: Home / Self Care | Payer: Medicare PPO | Source: Ambulatory Visit | Attending: Psychiatry | Admitting: Psychiatry

## 2021-08-20 DIAGNOSIS — G35 Multiple sclerosis: Secondary | ICD-10-CM | POA: Insufficient documentation

## 2021-08-20 MED ORDER — METHYLPREDNISOLONE SODIUM SUCC 125 MG IJ SOLR
INTRAMUSCULAR | Status: AC
  Filled 2021-08-20: qty 2

## 2021-08-20 MED ORDER — DIPHENHYDRAMINE HCL 50 MG/ML IJ SOLN
25.0000 mg | Freq: Once | INTRAMUSCULAR | Status: DC
  Administered 2021-08-20: 25 mg via INTRAVENOUS

## 2021-08-20 MED ORDER — ACETAMINOPHEN 500 MG PO TABS
ORAL_TABLET | ORAL | Status: AC
  Filled 2021-08-20: qty 2

## 2021-08-20 MED ORDER — DIPHENHYDRAMINE HCL 50 MG/ML IJ SOLN
INTRAMUSCULAR | Status: AC
  Filled 2021-08-20: qty 1

## 2021-08-20 MED ORDER — METHYLPREDNISOLONE SODIUM SUCC 125 MG IJ SOLR
125.0000 mg | Freq: Once | INTRAMUSCULAR | Status: DC
  Administered 2021-08-20: 125 mg via INTRAVENOUS

## 2021-08-20 MED ORDER — SODIUM CHLORIDE 0.9 % IV SOLN
1000.0000 mg | Freq: Once | INTRAVENOUS | Status: DC
  Administered 2021-08-20: 1000 mg via INTRAVENOUS
  Filled 2021-08-20: qty 100

## 2021-08-20 MED ORDER — ACETAMINOPHEN 500 MG PO TABS
1000.0000 mg | ORAL_TABLET | Freq: Once | ORAL | Status: DC
  Administered 2021-08-20: 1000 mg via ORAL

## 2021-08-27 ENCOUNTER — Encounter: Payer: Self-pay | Admitting: Dermatology

## 2021-08-27 NOTE — Progress Notes (Signed)
° °  Follow-Up Visit   Subjective  Angel Maddox is a 66 y.o. female who presents for the following: Skin Problem (Lesion on left eye x years. Lesion on right shoulder x 4 years. Lesion on stomach x years. ).  Several areas of concern on her skin Location:  Duration:  Quality:  Associated Signs/Symptoms: Modifying Factors:  Severity:  Timing: Context:   Objective  Well appearing patient in no apparent distress; mood and affect are within normal limits. Right Abdomen (side) - Upper Irregular by chromic 7 mm deeply pigmented macule with dermoscopic atypia, possible superficial melanoma       Right Posterior Neck Flesh-colored 5 mm papule       Right Breast, Torso - Posterior (Back) (4) Multiple 3 to 8 mm textured tan to brown flattopped papules  Left Temple Gritty 6 mm pink crust    A focused examination was performed including general skin exam but areas beneath undergarments not examined.. Relevant physical exam findings are noted in the Assessment and Plan.   Assessment & Plan    Neoplasm of uncertain behavior of skin (2) Right Abdomen (side) - Upper  Skin / nail biopsy Type of biopsy: tangential   Informed consent: discussed and consent obtained   Timeout: patient name, date of birth, surgical site, and procedure verified   Anesthesia: the lesion was anesthetized in a standard fashion   Anesthetic:  1% lidocaine w/ epinephrine 1-100,000 local infiltration Instrument used: flexible razor blade   Hemostasis achieved with: ferric subsulfate and electrodesiccation   Outcome: patient tolerated procedure well   Post-procedure details: wound care instructions given    Specimen 1 - Surgical pathology Differential Diagnosis: R/O BCC VS SCC  Check Margins: No  Right Posterior Neck  Skin / nail biopsy Type of biopsy: tangential   Informed consent: discussed and consent obtained   Timeout: patient name, date of birth, surgical site, and procedure  verified   Anesthesia: the lesion was anesthetized in a standard fashion   Anesthetic:  1% lidocaine w/ epinephrine 1-100,000 local infiltration Instrument used: flexible razor blade   Hemostasis achieved with: ferric subsulfate and electrodesiccation   Outcome: patient tolerated procedure well   Post-procedure details: wound care instructions given    Specimen 2 - Surgical pathology Differential Diagnosis: R/O BCC VS SCC  Check Margins: No  Margin of the shave biopsy 1.5 mm beyond the visible pigmented  Seborrheic keratosis (5) Right Breast; Torso - Posterior (Back) (4)  Leave if stable  Actinic keratosis Left Temple  Destruction of lesion - Left Temple Complexity: simple   Destruction method: cryotherapy   Informed consent: discussed and consent obtained   Timeout:  patient name, date of birth, surgical site, and procedure verified Lesion destroyed using liquid nitrogen: Yes   Cryotherapy cycles:  3 Outcome: patient tolerated procedure well with no complications   Post-procedure details: wound care instructions given        I, Lavonna Monarch, MD, have reviewed all documentation for this visit.  The documentation on 08/27/21 for the exam, diagnosis, procedures, and orders are all accurate and complete.

## 2021-09-01 ENCOUNTER — Ambulatory Visit: Payer: Medicare PPO | Admitting: Dermatology

## 2021-09-01 ENCOUNTER — Other Ambulatory Visit: Payer: Self-pay

## 2021-09-01 DIAGNOSIS — D225 Melanocytic nevi of trunk: Secondary | ICD-10-CM | POA: Diagnosis not present

## 2021-09-01 DIAGNOSIS — D229 Melanocytic nevi, unspecified: Secondary | ICD-10-CM

## 2021-09-01 DIAGNOSIS — L905 Scar conditions and fibrosis of skin: Secondary | ICD-10-CM | POA: Diagnosis not present

## 2021-09-01 NOTE — Patient Instructions (Signed)

## 2021-09-03 ENCOUNTER — Other Ambulatory Visit: Payer: Self-pay

## 2021-09-03 ENCOUNTER — Ambulatory Visit (HOSPITAL_COMMUNITY)
Admission: RE | Admit: 2021-09-03 | Discharge: 2021-09-03 | Disposition: A | Discharge status: Home / Self Care | Payer: Medicare PPO | Source: Ambulatory Visit | Attending: Psychiatry | Admitting: Psychiatry

## 2021-09-03 DIAGNOSIS — G35 Multiple sclerosis: Secondary | ICD-10-CM | POA: Insufficient documentation

## 2021-09-03 MED ORDER — DIPHENHYDRAMINE HCL 50 MG/ML IJ SOLN: 25.0000 mg | Freq: Once | INTRAMUSCULAR | Status: AC

## 2021-09-03 MED ORDER — METHYLPREDNISOLONE SODIUM SUCC 125 MG IJ SOLR
INTRAMUSCULAR | Status: AC
  Administered 2021-09-03: 125 mg via INTRAVENOUS
  Filled 2021-09-03: qty 2

## 2021-09-03 MED ORDER — METHYLPREDNISOLONE SODIUM SUCC 125 MG IJ SOLR: 125.0000 mg | Freq: Once | INTRAMUSCULAR | Status: AC

## 2021-09-03 MED ORDER — ACETAMINOPHEN 500 MG PO TABS
ORAL_TABLET | ORAL | Status: AC
  Filled 2021-09-03: qty 2

## 2021-09-03 MED ORDER — SODIUM CHLORIDE 0.9 % IV SOLN
1000.0000 mg | Freq: Once | INTRAVENOUS | Status: AC
  Administered 2021-09-03: 1000 mg via INTRAVENOUS
  Filled 2021-09-03: qty 100

## 2021-09-03 MED ORDER — ACETAMINOPHEN 500 MG PO TABS
1000.0000 mg | ORAL_TABLET | Freq: Once | ORAL | Status: AC
  Administered 2021-09-03: 1000 mg via ORAL

## 2021-09-03 MED ORDER — DIPHENHYDRAMINE HCL 50 MG/ML IJ SOLN
INTRAMUSCULAR | Status: AC
  Administered 2021-09-03: 25 mg via INTRAVENOUS
  Filled 2021-09-03: qty 1

## 2021-09-12 ENCOUNTER — Encounter: Payer: Self-pay | Admitting: Dermatology

## 2021-09-12 NOTE — Progress Notes (Signed)
? ?  Follow-Up Visit ?  ?Subjective  ?Angel Maddox is a 66 y.o. female who presents for the following: Procedure (Pt here for a wider shave on the abdomen ). ? ?Needs wider shave excision of severely atypical mole right ?Location:  ?Duration:  ?Quality:  ?Associated Signs/Symptoms: ?Modifying Factors:  ?Severity:  ?Timing: ?Context:  ? ?Objective  ?Well appearing patient in no apparent distress; mood and affect are within normal limits. ?Right Abdomen (side) - Upper ?Histologically severely atypical mole with positive deep margins; here for wider deeper shave excision. ? ? ? ?A focused examination was performed including abdomen. Relevant physical exam findings are noted in the Assessment and Plan. ? ? ?Assessment & Plan  ? ? ?Atypical nevus ?Right Abdomen (side) - Upper ? ?1.5 mm margins marked preoperatively ? ?Epidermal / dermal shaving - Right Abdomen (side) - Upper ? ?Lesion diameter (cm):  1.2 ?Informed consent: discussed and consent obtained   ?Timeout: patient name, date of birth, surgical site, and procedure verified   ?Procedure prep:  Patient was prepped and draped in usual sterile fashion ?Prep type:  Chlorhexidine ?Anesthesia: the lesion was anesthetized in a standard fashion   ?Anesthetic:  1% lidocaine w/ epinephrine 1-100,000 local infiltration ?Instrument used: DermaBlade   ?Hemostasis achieved with: ferric subsulfate   ?Outcome: patient tolerated procedure well   ?Post-procedure details: sterile dressing applied and wound care instructions given   ?Dressing type: petrolatum gauze, petrolatum and bandage   ? ?Specimen 1 - Surgical pathology ?Differential Diagnosis: R/O ATYPIA ?[id number] ?Check Margins: No ? ? ? ? ? ?I, Lavonna Monarch, MD, have reviewed all documentation for this visit.  The documentation on 09/12/21 for the exam, diagnosis, procedures, and orders are all accurate and complete. ?

## 2021-09-20 ENCOUNTER — Other Ambulatory Visit: Payer: Self-pay | Admitting: Physician Assistant

## 2021-10-16 ENCOUNTER — Other Ambulatory Visit: Payer: Self-pay | Admitting: Physician Assistant

## 2021-10-22 ENCOUNTER — Encounter: Payer: Self-pay | Admitting: Gastroenterology

## 2021-10-22 ENCOUNTER — Ambulatory Visit: Payer: Medicare PPO | Admitting: Gastroenterology

## 2021-10-22 VITALS — BP 124/80 | HR 79 | Ht 66.0 in | Wt 128.5 lb

## 2021-10-22 DIAGNOSIS — Z79899 Other long term (current) drug therapy: Secondary | ICD-10-CM

## 2021-10-22 DIAGNOSIS — R1310 Dysphagia, unspecified: Secondary | ICD-10-CM | POA: Diagnosis not present

## 2021-10-22 DIAGNOSIS — K219 Gastro-esophageal reflux disease without esophagitis: Secondary | ICD-10-CM

## 2021-10-22 DIAGNOSIS — R194 Change in bowel habit: Secondary | ICD-10-CM | POA: Diagnosis not present

## 2021-10-22 MED ORDER — OMEPRAZOLE 20 MG PO CPDR: 20.0000 mg | DELAYED_RELEASE_CAPSULE | Freq: Every day | ORAL | 3 refills | Status: DC

## 2021-10-22 NOTE — Patient Instructions (Addendum)
We have sent the following medications to your pharmacy for you to pick up at your convenience: ?Omeprazole 20 once daily (in place of omeprazole 40 mg dosing) ? ?Follow up as needed. ? ?If you are age 66 or older, your body mass index should be between 23-30. Your Body mass index is 20.74 kg/m?Marland Kitchen If this is out of the aforementioned range listed, please consider follow up with your Primary Care Provider. ? ?If you are age 41 or younger, your body mass index should be between 19-25. Your Body mass index is 20.74 kg/m?Marland Kitchen If this is out of the aformentioned range listed, please consider follow up with your Primary Care Provider.  ? ?________________________________________________________ ? ?The [facility] providers would like to encourage you to use [facility] to communicate with providers for non-urgent requests or questions.  Due to long hold times on the telephone, sending your provider a message by [facility] may be a faster and more efficient way to get a response.  Please allow 48 business hours for a response.  Please remember that this is for non-urgent requests.  ?_______________________________________________________ ? ?Due to recent changes in healthcare laws, you may see the results of your imaging and laboratory studies on MyChart before your provider has had a chance to review them.  We understand that in some cases there may be results that are confusing or concerning to you. Not all laboratory results come back in the same time frame and the provider may be waiting for multiple results in order to interpret others.  Please give [place] 48 hours in order for your provider to thoroughly review all the results before contacting the office for clarification of your results.  ? ?

## 2021-10-22 NOTE — Progress Notes (Signed)
? ?HPI :  ?66 year old female here for a follow-up visit.  She has a history of GERD, history of gastric ulcers, altered bowel habits. ? ?Recall that she has a history of GERD with some nausea, did not do well with Pepcid, had intolerance to Protonix in the past.  EGD July 2020 showed small hiatal hernia with gastritis and a diminutive ulcer, she tested negative for H. pylori.  We transitioned her to omeprazole 40 mg daily. ?Over time she has continued to take this daily, it is worked really well to control her symptoms of reflux and dyspepsia, she has been happy with it.  She does have a history of osteoporosis, is being followed for this and she thinks we will be starting therapy for at some point soon.  We discussed her options at the last visit in October in regards to her dosing and if she wanted to continue it, she did want to continue it at the time and held off on EGD in regards to following up the ulcer.  She continues to feel well without much dyspepsia or reflux and the omeprazole continues to work well.  She has had some rare recurrence of dysphagia since have last seen her.  This occurs to solids only, needs to drink fluids to help push food down.  Recall on her prior EGD her esophagus was normal but empiric dilation was performed.  She states that resolved her dysphagia at the time and has not recurred until just recently.  This been going on within recent months.  She wants to avoid repeat EGD if possible. ? ?Otherwise she has had altered bowel habits over the past few years.  She previously was having some increased stool frequency with occasional loose stools.  Imodium previously did not work well but she was doing a bit better on Metamucil.  She continue this at the last visit but ultimately stopped it as she states her bowels have returned to normal habits, seems regulated and she does not have any problems with them at this time.  She is happy about this. ? ?Prior workup: ?Colonoscopy 04/17/2017  - Tortuous colon. ?- One 6 mm polyp in the sigmoid colon, removed with a cold snare. Resected and retrieved. - adenoma ?- Internal hemorrhoids. ?- The examination was otherwise normal. ?  ?EGD 2008, normal ?  ?  ?EGD 01/24/2019 -  ?- A 1 cm hiatal hernia was present. ?- The exam of the esophagus was otherwise normal. No stenosis / stricture appreciated. ?- A guidewire was placed and the scope was withdrawn. Empiric dilation was performed in ?the entire esophagus with a Savary dilator with mild resistance at 17 mm. Relook endoscopy ?showed no mucosal wrents. Biopsies were taken with a cold forceps in the upper third of the ?esophagus, in the middle third of the esophagus and in the lower third of the esophagus for ?histology. ?- One non-bleeding superficial clean based gastric ulcer with no stigmata of bleeding was ?found at the pylorus. The lesion was 3 mm in largest dimension. ?- Patchy moderate inflammation characterized by erosions, erythema and friability was found ?in the gastric antrum. ?- The exam of the stomach was otherwise normal. ?- Biopsies were taken with a cold forceps in the gastric body, at the incisura and in the gastric ?antrum for Helicobacter pylori testing. ?- The duodenal bulb and second portion of the duodenum were normal. ?  ?  ?1. Surgical [P], gastric antrum and gastric body ?- MILD REACTIVE GASTROPATHY. ?- WARTHIN-STARRY IS  NEGATIVE FOR HELICOBACTER PYLORI. ?- NO INTESTINAL METAPLASIA, DYSPLASIA, OR MALIGNANCY. ?2. Surgical [P], distal, mid, proximal esophagus ?- BENIGN SQUAMOUS MUCOSA. ?- NO INCREASE IN INTRAEPITHELIAL EOSINOPHILS. ?- NO INTESTINAL METAPLASIA, DYSPLASIA, OR MALIGNANCY. ?  ? ? ?Past Medical History:  ?Diagnosis Date  ? Anxiety   ? Arthritis   ? osteo  ? Atypical mole 08/10/2021  ? Right Abdomen (side) upper (severe)  ? Bipolar 1 disorder ([place])   ? Cervicalgia   ? Chronic kidney disease   ? kidney stone  ? COPD (chronic obstructive pulmonary disease) ([place])   ? new diagnosis-  now sees pulmonologist  ? Depression   ? Deviated septum   ? Elevated liver enzymes   ? She says "normal now"  ? GERD (gastroesophageal reflux disease)   ? H/O hiatal hernia   ? Headache   ? Hip fracture ([place]) 2017  ? left  ? History of kidney stones   ? 4-5 yrs ago  ? Hypertension   ? Multiple sclerosis ([place])   ?  Had for 15 years  ? Multiple sclerosis ([place])   ? dx 1999  ? Tremors of nervous system   ? ? ? ?Past Surgical History:  ?Procedure Laterality Date  ? ANTERIOR CERVICAL DECOMP/DISCECTOMY FUSION N/A 07/28/2017  ? Procedure: CERVICAL FOUR-FIVE, CERVICAL FIVE-SIX ANTERIOR CERVICAL DECOMPRESSION/DISCECTOMY FUSION;  Surgeon: Eustace Moore, MD;  Location: [facility];  Service: Neurosurgery;  Laterality: N/A;  ? COLONOSCOPY  2008  ? Dr.Kaplan  ? EYE SURGERY    ? [facility]  ? HIP PINNING,CANNULATED Left 11/09/2015  ? Procedure: CANNULATED HIP PINNING;  Surgeon: Rod Can, MD;  Location: [facility];  Service: Orthopedics;  Laterality: Left;  ? NASAL SEPTOPLASTY W/ TURBINOPLASTY Bilateral 01/12/2021  ? Procedure: NASAL SEPTOPLASTY WITH BILATERAL  TURBINATE REDUCTION;  Surgeon: Leta Baptist, MD;  Location: [facility];  Service: ENT;  Laterality: Bilateral;  ? TUBAL LIGATION    ? ?Family History  ?Problem Relation Age of Onset  ? Heart attack Mother   ? Breast cancer Maternal Aunt   ? Colon cancer Neg Hx   ? Stomach cancer Neg Hx   ? Esophageal cancer Neg Hx   ? Rectal cancer Neg Hx   ? ?Social History  ? ?Tobacco Use  ? Smoking status: Former  ?  Packs/day: 0.25  ?  Years: 15.00  ?  Pack years: 3.75  ?  Types: Cigarettes  ?  Quit date: 01/25/1991  ?  Years since quitting: 30.7  ? Smokeless tobacco: Never  ? Tobacco comments:  ?  NO SMOKING at All  ?Vaping Use  ? Vaping Use: Never used  ?Substance Use Topics  ? Alcohol use: No  ? Drug use: No  ? ?Current Outpatient Medications  ?Medication Sig Dispense Refill  ? amLODipine (NORVASC) 10 MG tablet Take 1 tablet (10 mg total) by mouth daily. 90  tablet 1  ? baclofen (LIORESAL) 10 MG tablet Take 10 mg by mouth 3 (three) times daily.    ? buPROPion (WELLBUTRIN SR) 200 MG 12 hr tablet TAKE 1 TABLET BY MOUTH TWICE A DAY 180 tablet 1  ? citalopram (CELEXA) 40 MG tablet Take 1 tablet (40 mg total) by mouth daily. 30 tablet 11  ? clonazePAM (KLONOPIN) 1 MG disintegrating tablet 1 mg at bedtime.    ? dalfampridine 10 MG TB12 2 (two) times daily.    ? dicyclomine (BENTYL) 10 MG capsule Take 1 capsule (10 mg total) by mouth every 8 (  eight) hours as needed for spasms. 30 capsule 3  ? gabapentin (NEURONTIN) 300 MG capsule Take 2 capsules (600 mg total) by mouth 3 (three) times daily. 180 capsule 0  ? losartan (COZAAR) 25 MG tablet TAKE 1 TABLET BY MOUTH EVERY DAY 90 tablet 1  ? mirtazapine (REMERON) 15 MG tablet TAKE 0.5-1 TABLETS (7.5-15 MG TOTAL) BY MOUTH AT BEDTIME AS NEEDED. 90 tablet 3  ? mupirocin ointment (BACTROBAN) 2 % Apply 1 application topically 2 (two) times daily. 60 g 0  ? omeprazole (PRILOSEC) 40 MG capsule Take 1 capsule (40 mg total) by mouth daily. 90 capsule 1  ? ondansetron (ZOFRAN-ODT) 4 MG disintegrating tablet TAKE 1 TABLET BY MOUTH EVERY 8 HOURS AS NEEDED FOR NAUSEA AND VOMITING 30 tablet 2  ? primidone (MYSOLINE) 250 MG tablet Take 250 mg by mouth 3 (three) times daily.    ? psyllium (METAMUCIL) 58.6 % powder Take 1 packet by mouth daily.    ? riTUXimab (RITUXAN) 100 MG/10ML injection Inject into the vein.    ? tiZANidine (ZANAFLEX) 4 MG capsule     ? ?No current facility-administered medications for this visit.  ? ?Allergies  ?Allergen Reactions  ? Hydrocodone Itching  ? ? ? ?Review of Systems: ?All systems reviewed and negative except where noted in HPI.  ? ?Lab Results  ?Component Value Date  ? WBC 4.7 12/28/2018  ? HGB 13.0 12/28/2018  ? HCT 38.8 12/28/2018  ? MCV 98.0 12/28/2018  ? PLT 308 12/28/2018  ? ? ?Lab Results  ?Component Value Date  ? CREATININE 1.00 07/22/2019  ? BUN 9 07/22/2019  ? NA 138 07/22/2019  ? K 4.5 07/22/2019  ? CL 98  07/22/2019  ? CO2 25 07/22/2019  ? ? ? ?Physical Exam: ?BP 124/80   Pulse 79   Ht '5\' 6"'$  (1.676 m)   Wt 128 lb 8 oz (58.3 kg)   SpO2 98%   BMI 20.74 kg/m?  ?Constitutional: Pleasant,well-developed, fem

## 2021-10-27 DIAGNOSIS — I10 Essential (primary) hypertension: Secondary | ICD-10-CM | POA: Diagnosis not present

## 2021-10-27 DIAGNOSIS — D849 Immunodeficiency, unspecified: Secondary | ICD-10-CM | POA: Diagnosis not present

## 2021-10-27 DIAGNOSIS — R2689 Other abnormalities of gait and mobility: Secondary | ICD-10-CM | POA: Diagnosis not present

## 2021-10-27 DIAGNOSIS — G35 Multiple sclerosis: Secondary | ICD-10-CM | POA: Diagnosis not present

## 2021-10-28 ENCOUNTER — Other Ambulatory Visit: Payer: Self-pay | Admitting: Physician Assistant

## 2021-11-09 DIAGNOSIS — J343 Hypertrophy of nasal turbinates: Secondary | ICD-10-CM | POA: Diagnosis not present

## 2021-11-09 DIAGNOSIS — J310 Chronic rhinitis: Secondary | ICD-10-CM | POA: Diagnosis not present

## 2021-11-09 DIAGNOSIS — R0982 Postnasal drip: Secondary | ICD-10-CM | POA: Diagnosis not present

## 2021-11-11 ENCOUNTER — Other Ambulatory Visit: Payer: Self-pay | Admitting: Gastroenterology

## 2021-11-11 DIAGNOSIS — R1013 Epigastric pain: Secondary | ICD-10-CM

## 2021-12-01 ENCOUNTER — Ambulatory Visit: Payer: Medicare PPO | Admitting: Psychiatry

## 2021-12-01 ENCOUNTER — Ambulatory Visit: Payer: Medicare PPO | Admitting: Physician Assistant

## 2021-12-20 ENCOUNTER — Ambulatory Visit (INDEPENDENT_AMBULATORY_CARE_PROVIDER_SITE_OTHER): Payer: Medicare PPO | Admitting: Physician Assistant

## 2021-12-20 ENCOUNTER — Encounter: Payer: Self-pay | Admitting: Physician Assistant

## 2021-12-20 ENCOUNTER — Other Ambulatory Visit: Payer: Self-pay | Admitting: Physician Assistant

## 2021-12-20 DIAGNOSIS — G4700 Insomnia, unspecified: Secondary | ICD-10-CM

## 2021-12-20 DIAGNOSIS — F411 Generalized anxiety disorder: Secondary | ICD-10-CM

## 2021-12-20 DIAGNOSIS — G35 Multiple sclerosis: Secondary | ICD-10-CM | POA: Diagnosis not present

## 2021-12-20 DIAGNOSIS — F3341 Major depressive disorder, recurrent, in partial remission: Secondary | ICD-10-CM | POA: Diagnosis not present

## 2021-12-20 MED ORDER — BUPROPION HCL ER (SR) 200 MG PO TB12: 200.0000 mg | ORAL_TABLET | Freq: Two times a day (BID) | ORAL | 5 refills | Status: DC

## 2021-12-20 MED ORDER — CITALOPRAM HYDROBROMIDE 40 MG PO TABS
40.0000 mg | ORAL_TABLET | Freq: Every day | ORAL | 5 refills | Status: DC
Start: 2021-12-20 — End: 2022-02-23

## 2021-12-30 ENCOUNTER — Ambulatory Visit: Payer: Medicare PPO | Admitting: Psychiatry

## 2021-12-31 ENCOUNTER — Ambulatory Visit: Payer: Medicare PPO | Admitting: Psychiatry

## 2022-01-06 ENCOUNTER — Encounter (HOSPITAL_COMMUNITY)
Admission: RE | Admit: 2022-01-06 | Discharge: 2022-01-06 | Disposition: A | Discharge status: Home / Self Care | Payer: Medicare PPO | Source: Ambulatory Visit | Attending: Psychiatry | Admitting: Psychiatry

## 2022-01-06 DIAGNOSIS — G35 Multiple sclerosis: Secondary | ICD-10-CM | POA: Diagnosis not present

## 2022-01-06 MED ORDER — DIPHENHYDRAMINE HCL 50 MG/ML IJ SOLN
25.0000 mg | Freq: Once | INTRAMUSCULAR | Status: AC
  Administered 2022-01-06: 25 mg via INTRAVENOUS

## 2022-01-06 MED ORDER — DIPHENHYDRAMINE HCL 50 MG/ML IJ SOLN
INTRAMUSCULAR | Status: AC
  Filled 2022-01-06: qty 1

## 2022-01-06 MED ORDER — SODIUM CHLORIDE 0.9 % IV SOLN
1000.0000 mg | INTRAVENOUS | Status: DC
  Administered 2022-01-06: 1000 mg via INTRAVENOUS
  Filled 2022-01-06: qty 100

## 2022-01-06 MED ORDER — METHYLPREDNISOLONE SODIUM SUCC 125 MG IJ SOLR
125.0000 mg | Freq: Once | INTRAMUSCULAR | Status: AC
  Administered 2022-01-06: 125 mg via INTRAVENOUS

## 2022-01-06 MED ORDER — ACETAMINOPHEN 500 MG PO TABS
1000.0000 mg | ORAL_TABLET | Freq: Once | ORAL | Status: AC
  Administered 2022-01-06: 1000 mg via ORAL

## 2022-01-06 MED ORDER — ACETAMINOPHEN 500 MG PO TABS
ORAL_TABLET | ORAL | Status: AC
  Filled 2022-01-06: qty 2

## 2022-01-06 MED ORDER — METHYLPREDNISOLONE SODIUM SUCC 125 MG IJ SOLR
INTRAMUSCULAR | Status: AC
  Filled 2022-01-06: qty 2

## 2022-01-07 ENCOUNTER — Encounter (HOSPITAL_COMMUNITY): Payer: Medicare PPO

## 2022-01-10 DIAGNOSIS — G35 Multiple sclerosis: Secondary | ICD-10-CM | POA: Diagnosis not present

## 2022-01-20 ENCOUNTER — Ambulatory Visit: Payer: Medicare PPO | Admitting: Psychiatry

## 2022-01-20 ENCOUNTER — Encounter (HOSPITAL_COMMUNITY)
Admission: RE | Admit: 2022-01-20 | Discharge: 2022-01-20 | Disposition: A | Discharge status: Home / Self Care | Payer: Medicare PPO | Source: Ambulatory Visit

## 2022-01-20 DIAGNOSIS — G35 Multiple sclerosis: Secondary | ICD-10-CM | POA: Diagnosis not present

## 2022-01-20 MED ORDER — SODIUM CHLORIDE 0.9 % IV SOLN
1000.0000 mg | INTRAVENOUS | Status: DC
  Administered 2022-01-20: 1000 mg via INTRAVENOUS
  Filled 2022-01-20: qty 100

## 2022-01-20 MED ORDER — DIPHENHYDRAMINE HCL 50 MG/ML IJ SOLN: 25.0000 mg | Freq: Once | INTRAMUSCULAR | Status: AC

## 2022-01-20 MED ORDER — ACETAMINOPHEN 500 MG PO TABS
ORAL_TABLET | ORAL | Status: AC
  Administered 2022-01-20: 1000 mg via ORAL
  Filled 2022-01-20: qty 2

## 2022-01-20 MED ORDER — ACETAMINOPHEN 500 MG PO TABS: 1000.0000 mg | ORAL_TABLET | Freq: Once | ORAL | Status: AC

## 2022-01-20 MED ORDER — DIPHENHYDRAMINE HCL 50 MG/ML IJ SOLN
INTRAMUSCULAR | Status: AC
  Administered 2022-01-20: 25 mg via INTRAVENOUS
  Filled 2022-01-20: qty 1

## 2022-01-20 MED ORDER — METHYLPREDNISOLONE SODIUM SUCC 125 MG IJ SOLR
INTRAMUSCULAR | Status: AC
  Administered 2022-01-20: 125 mg via INTRAVENOUS
  Filled 2022-01-20: qty 2

## 2022-01-20 MED ORDER — METHYLPREDNISOLONE SODIUM SUCC 125 MG IJ SOLR: 125.0000 mg | Freq: Once | INTRAMUSCULAR | Status: AC

## 2022-02-09 DIAGNOSIS — G4733 Obstructive sleep apnea (adult) (pediatric): Secondary | ICD-10-CM | POA: Diagnosis not present

## 2022-02-09 DIAGNOSIS — G35 Multiple sclerosis: Secondary | ICD-10-CM | POA: Diagnosis not present

## 2022-02-09 DIAGNOSIS — F339 Major depressive disorder, recurrent, unspecified: Secondary | ICD-10-CM | POA: Diagnosis not present

## 2022-02-09 DIAGNOSIS — D849 Immunodeficiency, unspecified: Secondary | ICD-10-CM | POA: Diagnosis not present

## 2022-02-09 DIAGNOSIS — R2689 Other abnormalities of gait and mobility: Secondary | ICD-10-CM | POA: Diagnosis not present

## 2022-02-09 DIAGNOSIS — R42 Dizziness and giddiness: Secondary | ICD-10-CM | POA: Diagnosis not present

## 2022-02-09 DIAGNOSIS — D72819 Decreased white blood cell count, unspecified: Secondary | ICD-10-CM | POA: Diagnosis not present

## 2022-02-15 ENCOUNTER — Encounter: Payer: Self-pay | Admitting: Rehabilitative and Restorative Service Providers"

## 2022-02-15 ENCOUNTER — Ambulatory Visit: Payer: Medicare PPO | Attending: Nurse Practitioner | Admitting: Rehabilitative and Restorative Service Providers"

## 2022-02-15 ENCOUNTER — Other Ambulatory Visit: Payer: Self-pay

## 2022-02-15 DIAGNOSIS — R42 Dizziness and giddiness: Secondary | ICD-10-CM

## 2022-02-15 DIAGNOSIS — R2689 Other abnormalities of gait and mobility: Secondary | ICD-10-CM | POA: Diagnosis not present

## 2022-02-15 DIAGNOSIS — M6281 Muscle weakness (generalized): Secondary | ICD-10-CM

## 2022-02-15 NOTE — Therapy (Unsigned)
OUTPATIENT PHYSICAL THERAPY VESTIBULAR EVALUATION     Patient Name: Angel Maddox MRN: [id number] DOB:February 06, 1956, 66 y.o., female Today's Date: 02/15/2022  PCP: Monico Blitz, MD REFERRING PROVIDER: Ricarda Frame, FNP   PT End of Session - 02/15/22 1413     Visit Number 1    Date for PT Re-Evaluation 04/08/22    Authorization Type [place]    PT Start Time 1400    PT Stop Time 1440    PT Time Calculation (min) 40 min    Activity Tolerance Patient tolerated treatment well    Behavior During Therapy [facility] for tasks assessed/performed             Past Medical History:  Diagnosis Date   Anxiety    Arthritis    osteo   Atypical mole 08/10/2021   Right Abdomen (side) upper (severe)   Bipolar 1 disorder ([place])    Cervicalgia    Chronic kidney disease    kidney stone   COPD (chronic obstructive pulmonary disease) ([place])    new diagnosis- now sees pulmonologist   Depression    Deviated septum    Elevated liver enzymes    She says "normal now"   GERD (gastroesophageal reflux disease)    H/O hiatal hernia    Headache    Hip fracture ([place]) 2017   left   History of kidney stones    4-5 yrs ago   Hypertension    Multiple sclerosis ([place])     Had for 15 years   Multiple sclerosis ([place])    dx 1999   Tremors of nervous system    Past Surgical History:  Procedure Laterality Date   ANTERIOR CERVICAL DECOMP/DISCECTOMY FUSION N/A 07/28/2017   Procedure: CERVICAL FOUR-FIVE, CERVICAL FIVE-SIX ANTERIOR CERVICAL DECOMPRESSION/DISCECTOMY FUSION;  Surgeon: Eustace Moore, MD;  Location: [facility];  Service: Neurosurgery;  Laterality: N/A;   COLONOSCOPY  2008   Dr.Kaplan   EYE SURGERY     [facility]   HIP PINNING,CANNULATED Left 11/09/2015   Procedure: CANNULATED HIP PINNING;  Surgeon: Rod Can, MD;  [facility];  Service: Orthopedics;  Laterality: Left;   NASAL SEPTOPLASTY W/ TURBINOPLASTY Bilateral 01/12/2021   Procedure: NASAL SEPTOPLASTY WITH  BILATERAL  TURBINATE REDUCTION;  Surgeon: Leta Baptist, MD;  Location: [facility];  Service: ENT;  Laterality: Bilateral;   TUBAL LIGATION     Patient Active Problem List   Diagnosis Date Noted   COVID-19 virus infection 03/20/2020   'light-for-dates' infant with signs of fetal malnutrition 05/06/2019   Seizure-like activity ([place]) 12/27/2018   Dyslipidemia 12/20/2018   Hyponatremia syndrome 07/04/2018   Dyspnea on exertion 07/03/2018   COPD mixed type ([place]) 07/03/2018   MDD (major depressive disorder) 06/28/2018   GAD (generalized anxiety disorder) 06/28/2018   Major depressive disorder, recurrent episode, moderate ([place]) 04/17/2018   Head injury 12/08/2017   Cervical vertebral fusion 07/28/2017   DDD (degenerative disc disease), cervical 07/10/2017   DDD (degenerative disc disease), thoracic 07/10/2017   DDD (degenerative disc disease), lumbar 07/10/2017   Facet arthritis of cervical region 07/10/2017   Abnormal liver function tests 03/07/2017   Hypokalemia 03/07/2017   Gastroesophageal reflux disease with esophagitis 03/07/2017   Esophageal dysphagia 03/07/2017   Chronic idiopathic constipation 02/01/2017   Fatigue 02/01/2017   HSV-1 infection 01/12/2017   Osteoporosis with pathological fracture, with routine healing, subsequent encounter 08/30/2016   MDD (major depressive disorder), recurrent episode, severe ([place]) 03/29/2016   Estrogen deficiency 12/15/2015  MS (multiple sclerosis) ([place]) 12/15/2015   Tremor 12/15/2015   Healthcare maintenance 12/15/2015   Essential hypertension    Protein-calorie malnutrition, severe 11/09/2015   Bipolar 1 disorder, depressed ([place]) 02/11/2015    ONSET DATE: Reports 2-3 months ago  REFERRING DIAG: R42 Vertigo  THERAPY DIAG:  Dizziness and giddiness  Other abnormalities of gait and mobility  Muscle weakness (generalized)  Balance problem  Rationale for Evaluation and Treatment Rehabilitation  SUBJECTIVE:    SUBJECTIVE STATEMENT: Pt reports that in approx June of this year, she started having worsening vestibular symptoms.  She states that she randomly has sensation of the world spinning around her.  Pt reports that she has not had any occurences of this with driving. Pt accompanied by: self  PERTINENT HISTORY: MS, Depression, Immunodeficiency, Sleep Apnea    PAIN:  Are you having pain? Yes: NPRS scale: 3-8/10 Pain location: back and headache Pain description: "just hurts" Aggravating factors: exercise Relieving factors: pain medication  PRECAUTIONS: Fall  WEIGHT BEARING RESTRICTIONS No  FALLS: Has patient fallen in last 6 months? Yes. Number of falls 2 falls occurred during exercise (one with stability ball and one standing holding onto a chair)  LIVING ENVIRONMENT: Lives with: lives with their spouse Lives in: House/apartment Stairs: No Has following equipment at home: Single point cane, Environmental consultant - 4 wheeled, Wheelchair (manual), Electronics engineer, and Grab bars  PLOF: Independent  PATIENT GOALS:  "To stop this head from spinning."  OBJECTIVE:   DIAGNOSTIC FINDINGS: n/a  COGNITION: Overall cognitive status: Within functional limits for tasks assessed, but pt does state that she has some short term memory problems.   SENSATION: WFL  POSTURE: No Significant postural limitations   Cervical ROM:   WFL  STRENGTH:  BUE is WFL. BLE strength is grossly 4/5 throughout.  Some spasms/increased tone noted on LLE with doing MMT   GAIT: Gait pattern: decreased stride length and ataxic Distance walked: 100 ft Assistive device utilized: None Level of assistance: Modified independence Comments: Pt reports some increased pain with standing and walking for increased distances.  FUNCTIONAL TESTs:  5 times sit to stand: 15.1 sec without UE use  PATIENT SURVEYS:  DHI TBD   VESTIBULAR ASSESSMENT   GENERAL OBSERVATION: Pt with ataxia noted during ambulation with some tremors  noted.    SYMPTOM BEHAVIOR:   Subjective history: Pt reports dizziness since June, that is abnormal than the dizziness she typically experiences with her MS.   Non-Vestibular symptoms: headaches   Type of dizziness: Spinning/Vertigo   Frequency: daily   Duration: less than 5 minutes typically   Aggravating factors:  unknown   Relieving factors: head stationary and avoid busy/distracting environments   Progression of symptoms: worse   OCULOMOTOR EXAM:   Ocular Alignment: normal   Ocular ROM: No Limitations   Spontaneous Nystagmus: absent   Gaze-Induced Nystagmus: absent   Smooth Pursuits: intact   Saccades: intact   Convergence/Divergence: difficulty with convergence   VESTIBULAR - OCULAR REFLEX:    Positive head thrust test to either direction of head movement    POSITIONAL TESTING: Right Dix-Hallpike: nystagmus noted lasting approx 15 sec   OTHOSTATICS: not done   VESTIBULAR TREATMENT:  Canalith Repositioning:   Epley Right: Number of Reps: 2 and Response to Treatment: symptoms improved Gaze Adaptation:   x1 Viewing Horizontal: Comment: x15 sec and x1 Viewing Vertical:  Comment: x15 sec Other: Pencil Pushups 2x10  PATIENT EDUCATION: Education details: Pt issued HEP Person educated: Patient Education method: Explanation, Demonstration, and  Handouts Education comprehension: verbalized understanding and returned demonstration  HOME EXERCISE PROGRAM: Access Code: [email or web address] URL: [email or web address] Date: 02/15/2022 Prepared by: Shelby Dubin Drequan Ironside  Exercises - Pencil Pushups  - 1 x daily - 7 x weekly - 2 sets - 10 reps - Seated Gaze Stabilization with Head Nod  - 1 x daily - 7 x weekly - 3 sets - 10 reps - Seated Gaze Stabilization with Head Rotation  - 1 x daily - 7 x weekly - 3 sets - 10 reps   GOALS: Goals reviewed with patient? Yes  SHORT TERM GOALS: Target date: 03/08/2022   Pt will be independent with initial HEP. Baseline: Goal status:  INITIAL  2.  Pt will undergo Dizziness Handicapped Inventory assessment to assess balance. Baseline:  Goal status: INITIAL   LONG TERM GOALS: Target date: 04/08/2022    Pt will be independent with advanced HEP. Baseline:  Goal status: INITIAL  2.  Pt will have negative Hallpike-Dix consistently by last PT session. Baseline: positive on right Goal status: INITIAL  3.  Pt will increase BLE strength to at least 4+ to 5-/5 grossly throughout to allow for improved independence with functional mobility. Baseline:  Goal status: INITIAL  4.  Pt will report at least a 70% improvement in vestibular symptoms to decrease risk of falling. Baseline:  Goal status: INITIAL  5.  Pt will improve score on dizziness handicapped inventory by at least 15% to demonstrate decreased risk of falling. Baseline:  Goal status: INITIAL    ASSESSMENT:  CLINICAL IMPRESSION: Patient is a 66 y.o. female who was seen today for physical therapy evaluation and treatment for vertigo. Pts PLOF is independent and able to participate in chair yoga and a group yoga class.  Pt reports that she usually has some dizziness when she is due her injection for MS treatment, but since approximately June, she has been having increased dizziness, including when she is lying down.  Pt admits to 2 falls recently during her exercise class.  Pt with noted nystagmus with head thrust and with [employer].  Pt presents with decreased balance, dizziness, muscle weakness, and difficulty walking.  Pt would benefit from skilled PT to address her functional impairment and decrease her risk of falling.   OBJECTIVE IMPAIRMENTS decreased balance, decreased strength, decreased safety awareness, dizziness, and pain.   ACTIVITY LIMITATIONS standing, transfers, and locomotion level  PARTICIPATION LIMITATIONS: cleaning and community activity  PERSONAL FACTORS 1-2 comorbidities: MS, sleep apnea  are also affecting patient's functional outcome.    REHAB POTENTIAL: Good  CLINICAL DECISION MAKING: Evolving/moderate complexity  EVALUATION COMPLEXITY: Moderate   PLAN: PT FREQUENCY: 2x/week  PT DURATION: 8 weeks  PLANNED INTERVENTIONS: Therapeutic exercises, Therapeutic activity, Neuromuscular re-education, Balance training, Gait training, Patient/Family education, Self Care, Joint mobilization, Stair training, Vestibular training, Canalith repositioning, Aquatic Therapy, Dry Needling, Electrical stimulation, Spinal manipulation, Spinal mobilization, Cryotherapy, Moist heat, Taping, Vasopneumatic device, Traction, Ultrasound, Ionotophoresis '4mg'$ /ml Dexamethasone, Manual therapy, and Re-evaluation  PLAN FOR NEXT SESSION: Assess and progress HEP as indicated, vestibular rehab, [facility]   Juel Burrow, PT 02/15/2022, 3:52 PM   [facility] [address], [place], [place] [address] Phone # [phone] Fax [phone]

## 2022-02-18 ENCOUNTER — Ambulatory Visit: Payer: Medicare PPO | Admitting: Rehabilitative and Restorative Service Providers"

## 2022-02-18 ENCOUNTER — Encounter: Payer: Self-pay | Admitting: Rehabilitative and Restorative Service Providers"

## 2022-02-18 DIAGNOSIS — R2689 Other abnormalities of gait and mobility: Secondary | ICD-10-CM | POA: Diagnosis not present

## 2022-02-18 DIAGNOSIS — R42 Dizziness and giddiness: Secondary | ICD-10-CM

## 2022-02-18 DIAGNOSIS — M6281 Muscle weakness (generalized): Secondary | ICD-10-CM

## 2022-02-18 NOTE — Therapy (Signed)
OUTPATIENT PHYSICAL THERAPY VESTIBULAR EVALUATION     Patient Name: Angel Maddox MRN: [id number] DOB:1956-04-29, 66 y.o., female Today's Date: 02/18/2022  PCP: Angel Blitz, MD REFERRING PROVIDER: Ricarda Frame, FNP   PT End of Session - 02/18/22 1103     Visit Number 2    Date for PT Re-Evaluation 04/08/22    Authorization Type Cohere    Authorization Time Period 02/15/22 - 04/08/22    Authorization - Visit Number 2    Authorization - Number of Visits 10    PT Start Time 1100    PT Stop Time 1140    PT Time Calculation (min) 40 min    Activity Tolerance Patient tolerated treatment well    Behavior During Therapy Angel Maddox for tasks assessed/performed             Past Medical History:  Diagnosis Date   Anxiety    Arthritis    osteo   Atypical mole 08/10/2021   Right Abdomen (side) upper (severe)   Bipolar 1 disorder (Angel Maddox)    Cervicalgia    Chronic kidney disease    kidney stone   COPD (chronic obstructive pulmonary disease) (Angel Maddox)    new diagnosis- now sees pulmonologist   Depression    Deviated septum    Elevated liver enzymes    She says "normal now"   GERD (gastroesophageal reflux disease)    H/O hiatal hernia    Headache    Hip fracture (Angel Maddox) 2017   left   History of kidney stones    4-5 yrs ago   Hypertension    Multiple sclerosis (Angel Maddox)     Had for 15 years   Multiple sclerosis (Angel Maddox)    dx 1999   Tremors of nervous system    Past Surgical History:  Procedure Laterality Date   ANTERIOR CERVICAL DECOMP/DISCECTOMY FUSION N/A 07/28/2017   Procedure: CERVICAL FOUR-FIVE, CERVICAL FIVE-SIX ANTERIOR CERVICAL DECOMPRESSION/DISCECTOMY FUSION;  Surgeon: Angel Moore, MD;  Location: [place];  Service: Neurosurgery;  Laterality: N/A;   COLONOSCOPY  2008   Angel Maddox   EYE SURGERY     [facility]   HIP PINNING,CANNULATED Left 11/09/2015   Procedure: CANNULATED HIP PINNING;  Surgeon: Angel Can, MD;  Location: [facility];  Service:  Orthopedics;  Laterality: Left;   NASAL SEPTOPLASTY W/ TURBINOPLASTY Bilateral 01/12/2021   Procedure: NASAL SEPTOPLASTY WITH BILATERAL  TURBINATE REDUCTION;  Surgeon: Angel Baptist, MD;  Location: [place];  Service: ENT;  Laterality: Bilateral;   TUBAL LIGATION     Patient Active Problem List   Diagnosis Date Noted   COVID-19 virus infection 03/20/2020   'light-for-dates' infant with signs of fetal malnutrition 05/06/2019   Seizure-like activity (Angel Maddox) 12/27/2018   Dyslipidemia 12/20/2018   Hyponatremia syndrome 07/04/2018   Dyspnea on exertion 07/03/2018   COPD mixed type (Angel Maddox) 07/03/2018   MDD (major depressive disorder) 06/28/2018   GAD (generalized anxiety disorder) 06/28/2018   Major depressive disorder, recurrent episode, moderate (Angel Maddox) 04/17/2018   Head injury 12/08/2017   Cervical vertebral fusion 07/28/2017   DDD (degenerative disc disease), cervical 07/10/2017   DDD (degenerative disc disease), thoracic 07/10/2017   DDD (degenerative disc disease), lumbar 07/10/2017   Facet arthritis of cervical region 07/10/2017   Abnormal liver function tests 03/07/2017   Hypokalemia 03/07/2017   Gastroesophageal reflux disease with esophagitis 03/07/2017   Esophageal dysphagia 03/07/2017   Chronic idiopathic constipation 02/01/2017   Fatigue 02/01/2017   HSV-1 infection 01/12/2017   Osteoporosis with  pathological fracture, with routine healing, subsequent encounter 08/30/2016   MDD (major depressive disorder), recurrent episode, severe (Angel Maddox) 03/29/2016   Estrogen deficiency 12/15/2015   MS (multiple sclerosis) (Angel Maddox) 12/15/2015   Tremor 12/15/2015   Healthcare maintenance 12/15/2015   Essential hypertension    Protein-calorie malnutrition, severe 11/09/2015   Bipolar 1 disorder, depressed (Angel Maddox) 02/11/2015    ONSET DATE: Reports 2-3 months ago  REFERRING DIAG: R42 Vertigo  THERAPY DIAG:  Dizziness and giddiness  Other abnormalities of gait and mobility  Muscle  weakness (generalized)  Balance problem  Rationale for Evaluation and Treatment Rehabilitation  SUBJECTIVE:   SUBJECTIVE STATEMENT: Pt reports that she has not had any of the "room spinning" dizziness since initial evaluation.  Denies pain currently, but states 8/10 pain yesterday because she was really busy.  PERTINENT HISTORY: MS, Depression, Immunodeficiency, Sleep Apnea    PAIN:  Are you having pain? Yes: NPRS scale: 0-8/10 Pain location: back and headache Pain description: "just hurts" Aggravating factors: exercise Relieving factors: pain medication  PRECAUTIONS: Fall  WEIGHT BEARING RESTRICTIONS No  FALLS: Has patient fallen in last 6 months? Yes. Number of falls 2 falls occurred during exercise (one with stability ball and one standing holding onto a chair)  LIVING ENVIRONMENT: Lives with: lives with their spouse Lives in: House/apartment Stairs: No Has following equipment at home: Single point cane, Environmental consultant - 4 wheeled, Wheelchair (manual), Electronics engineer, and Grab bars  PLOF: Independent  PATIENT GOALS:  "To stop this head from spinning."  OBJECTIVE:   DIAGNOSTIC FINDINGS: n/a  COGNITION: Overall cognitive status: Within functional limits for tasks assessed, but pt does state that she has some short term memory problems.   SENSATION: WFL  POSTURE: No Significant postural limitations   Cervical ROM:   WFL  STRENGTH:  BUE is WFL. BLE strength is grossly 4/5 throughout.  Some spasms/increased tone noted on LLE with doing MMT   GAIT: Gait pattern: decreased stride length and ataxic Distance walked: 100 ft Assistive device utilized: None Level of assistance: Modified independence Comments: Pt reports some increased pain with standing and walking for increased distances.  FUNCTIONAL TESTs:  5 times sit to stand: 15.1 sec without UE use  PATIENT SURVEYS:  DHI TBD   VESTIBULAR ASSESSMENT   GENERAL OBSERVATION: Pt with ataxia noted during  ambulation with some tremors noted.    SYMPTOM BEHAVIOR:   Subjective history: Pt reports dizziness since June, that is abnormal than the dizziness she typically experiences with her MS.   Non-Vestibular symptoms: headaches   Type of dizziness: Spinning/Vertigo   Frequency: daily   Duration: less than 5 minutes typically   Aggravating factors:  unknown   Relieving factors: head stationary and avoid busy/distracting environments   Progression of symptoms: worse   OCULOMOTOR EXAM:   Ocular Alignment: normal   Ocular ROM: No Limitations   Spontaneous Nystagmus: absent   Gaze-Induced Nystagmus: absent   Smooth Pursuits: intact   Saccades: intact   Convergence/Divergence: difficulty with convergence   VESTIBULAR - OCULAR REFLEX:    Positive head thrust test to either direction of head movement    POSITIONAL TESTING: Right Dix-Hallpike: nystagmus noted lasting approx 15 sec   OTHOSTATICS: not done   VESTIBULAR TREATMENT:  TODAY'S TREATMENT: 02/18/2022: Marye Round negative bilateral sides Nustep level 3 x5 min with PT present to discuss progress/status Standing on blue foam 2x1 min with PT providing CGA and unsteady standing balance Ambulation with RW x200 ft with close SBA with cuing for relaxing  shoulders Seated with 1# ankle weights:  LAQ and marching  BLE 2x10   02/15/22: Canalith Repositioning:   Epley Right: Number of Reps: 2 and Response to Treatment: symptoms improved Gaze Adaptation:   x1 Viewing Horizontal: Comment: x15 sec and x1 Viewing Vertical:  Comment: x15 sec Other: Pencil Pushups 2x10  PATIENT EDUCATION: Education details: Pt issued HEP Person educated: Patient Education method: Explanation, Demonstration, and Handouts Education comprehension: verbalized understanding and returned demonstration  HOME EXERCISE PROGRAM: Access Code: [email or web address] URL: [email or web address] Date: 02/15/2022 Prepared by: Shelby Dubin Jairo Bellew  Exercises - Pencil  Pushups  - 1 x daily - 7 x weekly - 2 sets - 10 reps - Seated Gaze Stabilization with Head Nod  - 1 x daily - 7 x weekly - 3 sets - 10 reps - Seated Gaze Stabilization with Head Rotation  - 1 x daily - 7 x weekly - 3 sets - 10 reps   GOALS: Goals reviewed with patient? Yes  SHORT TERM GOALS: Target date: 03/08/2022   Pt will be independent with initial HEP. Baseline: Goal status: IN PROGRESS  2.  Pt will undergo Dizziness Handicapped Inventory assessment to assess balance. Baseline:  Goal status: INITIAL   LONG TERM GOALS: Target date: 04/08/2022    Pt will be independent with advanced HEP. Baseline:  Goal status: INITIAL  2.  Pt will have negative Hallpike-Dix consistently by last PT session. Baseline: positive on right Goal status: INITIAL  3.  Pt will increase BLE strength to at least 4+ to 5-/5 grossly throughout to allow for improved independence with functional mobility. Baseline:  Goal status: INITIAL  4.  Pt will report at least a 70% improvement in vestibular symptoms to decrease risk of falling. Baseline:  Goal status: INITIAL  5.  Pt will improve score on dizziness handicapped inventory by at least 15% to demonstrate decreased risk of falling. Baseline:  Goal status: INITIAL    ASSESSMENT:  CLINICAL IMPRESSION: Ms Frank presents to skilled PT reporting that her dizziness has improved greatly since initial evaluation.  Pt with negative Dix-Hallpike today on bilateral sides.  Pt able to progress with ambulation with RW during session today with cuing to relax her shoulders to decrease tremors noted and strain on shoulders.  Pt with improved balance and improved foot clearance noted with use of RW.  Pt reports muscle fatigue with therex.  Pt continues to require skilled PT to progress towards goal related activities.   OBJECTIVE IMPAIRMENTS decreased balance, decreased strength, decreased safety awareness, dizziness, and pain.   ACTIVITY LIMITATIONS  standing, transfers, and locomotion level  PARTICIPATION LIMITATIONS: cleaning and community activity  PERSONAL FACTORS 1-2 comorbidities: MS, sleep apnea  are also affecting patient's functional outcome.   REHAB POTENTIAL: Good  CLINICAL DECISION MAKING: Evolving/moderate complexity  EVALUATION COMPLEXITY: Moderate   PLAN: PT FREQUENCY: 2x/week  PT DURATION: 8 weeks  PLANNED INTERVENTIONS: Therapeutic exercises, Therapeutic activity, Neuromuscular re-education, Balance training, Gait training, Patient/Family education, Self Care, Joint mobilization, Stair training, Vestibular training, Canalith repositioning, Aquatic Therapy, Dry Needling, Electrical stimulation, Spinal manipulation, Spinal mobilization, Cryotherapy, Moist heat, Taping, Vasopneumatic device, Traction, Ultrasound, Ionotophoresis '4mg'$ /ml Dexamethasone, Manual therapy, and Re-evaluation  PLAN FOR NEXT SESSION: Assess and progress HEP as indicated, vestibular rehab, [place], PT 02/18/2022, 11:59 AM   [facility] [address] [place], [place] [address] Phone # [phone] Fax [phone]

## 2022-02-21 ENCOUNTER — Ambulatory Visit: Payer: Medicare PPO | Admitting: Rehabilitative and Restorative Service Providers"

## 2022-02-21 ENCOUNTER — Encounter: Payer: Self-pay | Admitting: Rehabilitative and Restorative Service Providers"

## 2022-02-21 DIAGNOSIS — M6281 Muscle weakness (generalized): Secondary | ICD-10-CM | POA: Diagnosis not present

## 2022-02-21 DIAGNOSIS — R42 Dizziness and giddiness: Secondary | ICD-10-CM | POA: Diagnosis not present

## 2022-02-21 DIAGNOSIS — R2689 Other abnormalities of gait and mobility: Secondary | ICD-10-CM | POA: Diagnosis not present

## 2022-02-21 NOTE — Therapy (Signed)
OUTPATIENT PHYSICAL THERAPY TREATMENT NOTE     Patient Name: Angel Maddox MRN: [id number] DOB:03-24-1956, 66 y.o., female Today's Date: 02/21/2022  PCP: Monico Blitz, MD REFERRING PROVIDER: Ricarda Frame, FNP   PT End of Session - 02/21/22 0806     Visit Number 3    Date for PT Re-Evaluation 04/08/22    Authorization Type [employer]here    Authorization Time Period 02/15/22 - 04/08/22    Authorization - Visit Number 3    Authorization - Number of Visits 10    PT Start Time 0800    PT Stop Time 0840    PT Time Calculation (min) 40 min    Activity Tolerance Patient tolerated treatment well    Behavior During Therapy [facility] for tasks assessed/performed             Past Medical History:  Diagnosis Date   Anxiety    Arthritis    osteo   Atypical mole 08/10/2021   Right Abdomen (side) upper (severe)   Bipolar 1 disorder ([place])    Cervicalgia    Chronic kidney disease    kidney stone   COPD (chronic obstructive pulmonary disease) ([place])    new diagnosis- now sees pulmonologist   Depression    Deviated septum    Elevated liver enzymes    She says "normal now"   GERD (gastroesophageal reflux disease)    H/O hiatal hernia    Headache    Hip fracture ([place]) 2017   left   History of kidney stones    4-5 yrs ago   Hypertension    Multiple sclerosis ([place])     Had for 15 years   Multiple sclerosis ([place])    dx 1999   Tremors of nervous system    Past Surgical History:  Procedure Laterality Date   ANTERIOR CERVICAL DECOMP/DISCECTOMY FUSION N/A 07/28/2017   Procedure: CERVICAL FOUR-FIVE, CERVICAL FIVE-SIX ANTERIOR CERVICAL DECOMPRESSION/DISCECTOMY FUSION;  Surgeon: Eustace Moore, MD;  Location: [place];  Service: Neurosurgery;  Laterality: N/A;   COLONOSCOPY  2008   Dr.Kaplan   EYE SURGERY     [facility]   HIP PINNING,CANNULATED Left 11/09/2015   Procedure: CANNULATED HIP PINNING;  Surgeon: Rod Can, MD;  [facility];  Service: Orthopedics;   Laterality: Left;   NASAL SEPTOPLASTY W/ TURBINOPLASTY Bilateral 01/12/2021   Procedure: NASAL SEPTOPLASTY WITH BILATERAL  TURBINATE REDUCTION;  Surgeon: Leta Baptist, MD;  Location: [place];  Service: ENT;  Laterality: Bilateral;   TUBAL LIGATION     Patient Active Problem List   Diagnosis Date Noted   COVID-19 virus infection 03/20/2020   'light-for-dates' infant with signs of fetal malnutrition 05/06/2019   Seizure-like activity ([place]) 12/27/2018   Dyslipidemia 12/20/2018   Hyponatremia syndrome 07/04/2018   Dyspnea on exertion 07/03/2018   COPD mixed type ([place]) 07/03/2018   MDD (major depressive disorder) 06/28/2018   GAD (generalized anxiety disorder) 06/28/2018   Major depressive disorder, recurrent episode, moderate ([place]) 04/17/2018   Head injury 12/08/2017   Cervical vertebral fusion 07/28/2017   DDD (degenerative disc disease), cervical 07/10/2017   DDD (degenerative disc disease), thoracic 07/10/2017   DDD (degenerative disc disease), lumbar 07/10/2017   Facet arthritis of cervical region 07/10/2017   Abnormal liver function tests 03/07/2017   Hypokalemia 03/07/2017   Gastroesophageal reflux disease with esophagitis 03/07/2017   Esophageal dysphagia 03/07/2017   Chronic idiopathic constipation 02/01/2017   Fatigue 02/01/2017   HSV-1 infection 01/12/2017   Osteoporosis with  pathological fracture, with routine healing, subsequent encounter 08/30/2016   MDD (major depressive disorder), recurrent episode, severe ([place]) 03/29/2016   Estrogen deficiency 12/15/2015   MS (multiple sclerosis) ([place]) 12/15/2015   Tremor 12/15/2015   Healthcare maintenance 12/15/2015   Essential hypertension    Protein-calorie malnutrition, severe 11/09/2015   Bipolar 1 disorder, depressed ([place]) 02/11/2015    ONSET DATE: Reports 2-3 months ago  REFERRING DIAG: R42 Vertigo  THERAPY DIAG:  Dizziness and giddiness  Other abnormalities of gait and mobility  Muscle weakness  (generalized)  Rationale for Evaluation and Treatment Rehabilitation  SUBJECTIVE:   SUBJECTIVE STATEMENT: Pt reports that she continues to not have the dizziness that she did before.  Pt presented to session with her 4WRW.  PERTINENT HISTORY: MS, Depression, Immunodeficiency, Sleep Apnea    PAIN:  Are you having pain? Yes: NPRS scale: 0-8/10 Pain location: back and headache Pain description: "just hurts" Aggravating factors: exercise Relieving factors: pain medication  PRECAUTIONS: Fall  WEIGHT BEARING RESTRICTIONS No  FALLS: Has patient fallen in last 6 months? Yes. Number of falls 2 falls occurred during exercise (one with stability ball and one standing holding onto a chair)  LIVING ENVIRONMENT: Lives with: lives with their spouse Lives in: House/apartment Stairs: No Has following equipment at home: Single point cane, Environmental consultant - 4 wheeled, Wheelchair (manual), Electronics engineer, and Grab bars  PLOF: Independent  PATIENT GOALS:  "To stop this head from spinning."  OBJECTIVE:   DIAGNOSTIC FINDINGS: n/a  COGNITION: Overall cognitive status: Within functional limits for tasks assessed, but pt does state that she has some short term memory problems.   SENSATION: WFL  POSTURE: No Significant postural limitations   Cervical ROM:   WFL  STRENGTH:  BUE is WFL. BLE strength is grossly 4/5 throughout.  Some spasms/increased tone noted on LLE with doing MMT   GAIT: Gait pattern: decreased stride length and ataxic Distance walked: 100 ft Assistive device utilized: None Level of assistance: Modified independence Comments: Pt reports some increased pain with standing and walking for increased distances.  FUNCTIONAL TESTs:  5 times sit to stand: 15.1 sec without UE use  PATIENT SURVEYS:  02/21/2022:  DHI 0% DHI Total Score: 0 / 100 Physical Score: 0 / 28 Emotional Score: 0 / 36 Functional Score: 0 / 36   VESTIBULAR ASSESSMENT   GENERAL OBSERVATION: Pt with  ataxia noted during ambulation with some tremors noted.    SYMPTOM BEHAVIOR:   Subjective history: Pt reports dizziness since June, that is abnormal than the dizziness she typically experiences with her MS.   Non-Vestibular symptoms: headaches   Type of dizziness: Spinning/Vertigo   Frequency: daily   Duration: less than 5 minutes typically   Aggravating factors:  unknown   Relieving factors: head stationary and avoid busy/distracting environments   Progression of symptoms: worse   OCULOMOTOR EXAM:   Ocular Alignment: normal   Ocular ROM: No Limitations   Spontaneous Nystagmus: absent   Gaze-Induced Nystagmus: absent   Smooth Pursuits: intact   Saccades: intact   Convergence/Divergence: difficulty with convergence   VESTIBULAR - OCULAR REFLEX:    Positive head thrust test to either direction of head movement    POSITIONAL TESTING: Right Dix-Hallpike: nystagmus noted lasting approx 15 sec   OTHOSTATICS: not done   VESTIBULAR TREATMENT:  TODAY'S TREATMENT: 02/21/2022: Nustep level 4 x6 min with PT present to discuss progress/status DHI 0/100 Seated with 1.5# ankle weights:  Heel/toe raises, LAQ, marching, and hip abduction scissors.  BLE 2x10  each Seated hip adduction ball squeeze 2x10 Seated hamstring curls with red tband 2x10 bilat Sit to/from stand with red tband around knees to maintain knee position  02/18/2022: Marye Round negative bilateral sides Nustep level 3 x5 min with PT present to discuss progress/status Standing on blue foam 2x1 min with PT providing CGA and unsteady standing balance Ambulation with RW x200 ft with close SBA with cuing for relaxing shoulders Seated with 1# ankle weights:  LAQ and marching  BLE 2x10   02/15/22: Canalith Repositioning:   Epley Right: Number of Reps: 2 and Response to Treatment: symptoms improved Gaze Adaptation:   x1 Viewing Horizontal: Comment: x15 sec and x1 Viewing Vertical:  Comment: x15 sec Other: Pencil Pushups  2x10  PATIENT EDUCATION: Education details: Pt issued HEP Person educated: Patient Education method: Explanation, Demonstration, and Handouts Education comprehension: verbalized understanding and returned demonstration  HOME EXERCISE PROGRAM: Access Code: [email or web address] URL: [email or web address] Date: 02/21/2022 Prepared by: Shelby Dubin Mauricia Mertens  Exercises - Pencil Pushups  - 1 x daily - 7 x weekly - 2 sets - 10 reps - Seated Gaze Stabilization with Head Nod  - 1 x daily - 7 x weekly - 3 sets - 10 reps - Seated Gaze Stabilization with Head Rotation  - 1 x daily - 7 x weekly - 3 sets - 10 reps - Seated Long Arc Quad  - 1 x daily - 7 x weekly - 2 sets - 10 reps - Seated March  - 1 x daily - 7 x weekly - 2 sets - 10 reps - Sit to Stand Without Arm Support  - 1 x daily - 7 x weekly - 2 sets - 10 reps - Seated Hip Adduction Isometrics with Ball  - 1 x daily - 7 x weekly - 2 sets - 10 reps  GOALS: Goals reviewed with patient? Yes  SHORT TERM GOALS: Target date: 03/08/2022   Pt will be independent with initial HEP. Baseline: Goal status: MET  2.  Pt will undergo Dizziness Handicapped Inventory assessment to assess balance. Baseline:  Goal status: MET   LONG TERM GOALS: Target date: 04/08/2022    Pt will be independent with advanced HEP. Baseline:  Goal status: INITIAL  2.  Pt will have negative Hallpike-Dix consistently by last PT session. Baseline: positive on right Goal status: INITIAL  3.  Pt will increase BLE strength to at least 4+ to 5-/5 grossly throughout to allow for improved independence with functional mobility. Baseline:  Goal status: INITIAL  4.  Pt will report at least a 70% improvement in vestibular symptoms to decrease risk of falling. Baseline:  Goal status: INITIAL  5.  Pt will improve score on dizziness handicapped inventory by at least 15% to demonstrate decreased risk of falling. Baseline:  Goal status: INITIAL    ASSESSMENT:  CLINICAL  IMPRESSION: Ms Roark presents to skilled PT reporting that she is still not having any vestibular symptoms.  Pt with 0/100 Dizziness Handicapped Inventory. Pt requires cuing during sit to/from stand to maintain proper knee alignment and prevent genu valgus. Pt continues to progress with strengthening through exercises.  Reviewed safety with use of 4WRW and pt able to demonstrate improved gait pattern with use of 4WRW with improved balance.  Reviewed safety with exercise at class and sitting in chair vs sitting on physioball or standing, pt verbalizes understanding.  Pt continues to require skilled PT to progress towards goal related activities.   OBJECTIVE IMPAIRMENTS decreased balance, decreased strength, decreased safety awareness, dizziness,  and pain.   ACTIVITY LIMITATIONS standing, transfers, and locomotion level  PARTICIPATION LIMITATIONS: cleaning and community activity  PERSONAL FACTORS 1-2 comorbidities: MS, sleep apnea  are also affecting patient's functional outcome.   REHAB POTENTIAL: Good  CLINICAL DECISION MAKING: Evolving/moderate complexity  EVALUATION COMPLEXITY: Moderate   PLAN: PT FREQUENCY: 2x/week  PT DURATION: 8 weeks  PLANNED INTERVENTIONS: Therapeutic exercises, Therapeutic activity, Neuromuscular re-education, Balance training, Gait training, Patient/Family education, Self Care, Joint mobilization, Stair training, Vestibular training, Canalith repositioning, Aquatic Therapy, Dry Needling, Electrical stimulation, Spinal manipulation, Spinal mobilization, Cryotherapy, Moist heat, Taping, Vasopneumatic device, Traction, Ultrasound, Ionotophoresis 23m/ml Dexamethasone, Manual therapy, and Re-evaluation  PLAN FOR NEXT SESSION: Assess and progress HEP as indicated, vestibular rehab as indicated, strengthening, balance   SJuel Burrow PT 02/21/2022, 9:21 AM   [facility] SValdostaGCarnegie West [place] [address]Phone #  [phone] [phone]

## 2022-02-22 ENCOUNTER — Other Ambulatory Visit: Payer: Self-pay | Admitting: Physician Assistant

## 2022-03-15 ENCOUNTER — Encounter: Payer: Medicare PPO | Admitting: Rehabilitative and Restorative Service Providers"

## 2022-03-16 ENCOUNTER — Ambulatory Visit: Payer: Medicare PPO | Admitting: Rehabilitative and Restorative Service Providers"

## 2022-03-17 ENCOUNTER — Encounter: Payer: Medicare PPO | Admitting: Rehabilitative and Restorative Service Providers"

## 2022-03-22 ENCOUNTER — Ambulatory Visit: Payer: Medicare PPO | Admitting: Rehabilitative and Restorative Service Providers"

## 2022-03-24 ENCOUNTER — Ambulatory Visit: Payer: Medicare PPO | Attending: Nurse Practitioner | Admitting: Rehabilitative and Restorative Service Providers"

## 2022-03-24 ENCOUNTER — Encounter: Payer: Self-pay | Admitting: Rehabilitative and Restorative Service Providers"

## 2022-03-24 DIAGNOSIS — R42 Dizziness and giddiness: Secondary | ICD-10-CM | POA: Diagnosis not present

## 2022-03-24 DIAGNOSIS — R2689 Other abnormalities of gait and mobility: Secondary | ICD-10-CM | POA: Diagnosis not present

## 2022-03-24 DIAGNOSIS — M6281 Muscle weakness (generalized): Secondary | ICD-10-CM | POA: Diagnosis not present

## 2022-03-24 NOTE — Therapy (Signed)
OUTPATIENT PHYSICAL THERAPY TREATMENT NOTE     Patient Name: Angel Maddox MRN: [id number] DOB:[birth year], 66 y.o., female Today's Date: 03/24/2022  PCP: Monico Blitz, MD REFERRING PROVIDER: Ricarda Frame, FNP   PT End of Session - 03/24/22 1148     Visit Number 4    Date for PT Re-Evaluation 04/08/22    Authorization Type Cohere    Authorization Time Period 02/15/22 - 04/08/22    Authorization - Visit Number 4    Authorization - Number of Visits 10    PT Start Time 8882    PT Stop Time 1220    PT Time Calculation (min) 38 min    Activity Tolerance Patient tolerated treatment well    Behavior During Therapy [facility] for tasks assessed/performed             Past Medical History:  Diagnosis Date   Anxiety    Arthritis    osteo   Atypical mole 08/10/2021   Right Abdomen (side) upper (severe)   Bipolar 1 disorder ([place])    Cervicalgia    Chronic kidney disease    kidney stone   COPD (chronic obstructive pulmonary disease) ([place])    new diagnosis- now sees pulmonologist   Depression    Deviated septum    Elevated liver enzymes    She says "normal now"   GERD (gastroesophageal reflux disease)    H/O hiatal hernia    Headache    Hip fracture ([place]) 2017   left   History of kidney stones    4-5 yrs ago   Hypertension    Multiple sclerosis ([place])     Had for 15 years   Multiple sclerosis ([place])    dx 1999   Tremors of nervous system    Past Surgical History:  Procedure Laterality Date   ANTERIOR CERVICAL DECOMP/DISCECTOMY FUSION N/A 07/28/2017   Procedure: CERVICAL FOUR-FIVE, CERVICAL FIVE-SIX ANTERIOR CERVICAL DECOMPRESSION/DISCECTOMY FUSION;  Surgeon: Eustace Moore, MD;  Location: [place];  Service: Neurosurgery;  Laterality: N/A;   COLONOSCOPY  2008   Dr.Kaplan   EYE SURGERY     [facility]   HIP PINNING,CANNULATED Left 11/09/2015   Procedure: CANNULATED HIP PINNING;  Surgeon: Rod Can, MD;  [facility];  Service: Orthopedics;   Laterality: Left;   NASAL SEPTOPLASTY W/ TURBINOPLASTY Bilateral 01/12/2021   Procedure: NASAL SEPTOPLASTY WITH BILATERAL  TURBINATE REDUCTION;  Surgeon: Leta Baptist, MD;  Location: [place];  Service: ENT;  Laterality: Bilateral;   TUBAL LIGATION     Patient Active Problem List   Diagnosis Date Noted   COVID-19 virus infection 03/20/2020   'light-for-dates' infant with signs of fetal malnutrition 05/06/2019   Seizure-like activity ([place]) 12/27/2018   Dyslipidemia 12/20/2018   Hyponatremia syndrome 07/04/2018   Dyspnea on exertion 07/03/2018   COPD mixed type ([place]) 07/03/2018   MDD (major depressive disorder) 06/28/2018   GAD (generalized anxiety disorder) 06/28/2018   Major depressive disorder, recurrent episode, moderate ([place]) 04/17/2018   Head injury 12/08/2017   Cervical vertebral fusion 07/28/2017   DDD (degenerative disc disease), cervical 07/10/2017   DDD (degenerative disc disease), thoracic 07/10/2017   DDD (degenerative disc disease), lumbar 07/10/2017   Facet arthritis of cervical region 07/10/2017   Abnormal liver function tests 03/07/2017   Hypokalemia 03/07/2017   Gastroesophageal reflux disease with esophagitis 03/07/2017   Esophageal dysphagia 03/07/2017   Chronic idiopathic constipation 02/01/2017   Fatigue 02/01/2017   HSV-1 infection 01/12/2017   Osteoporosis with  pathological fracture, with routine healing, subsequent encounter 08/30/2016   MDD (major depressive disorder), recurrent episode, severe ([place]) 03/29/2016   Estrogen deficiency 12/15/2015   MS (multiple sclerosis) ([place]) 12/15/2015   Tremor 12/15/2015   Healthcare maintenance 12/15/2015   Essential hypertension    Protein-calorie malnutrition, severe 11/09/2015   Bipolar 1 disorder, depressed ([place]) 02/11/2015    ONSET DATE: Reports 2-3 months ago  REFERRING DIAG: R42 Vertigo  THERAPY DIAG:  Balance problem  Other abnormalities of gait and mobility  Muscle weakness  (generalized)  Dizziness and giddiness  Rationale for Evaluation and Treatment Rehabilitation  SUBJECTIVE:   SUBJECTIVE STATEMENT: Pt continues to deny dizziness since [place].  Pt states that she has been using weights to exercise while she was out of town.  PERTINENT HISTORY: MS, Depression, Immunodeficiency, Sleep Apnea    PAIN:  Are you having pain? Yes: NPRS scale: 7/10 Pain location: back and cervical/headache Pain description: "just hurts" Aggravating factors: exercise Relieving factors: pain medication  PRECAUTIONS: Fall  WEIGHT BEARING RESTRICTIONS No  FALLS: Has patient fallen in last 6 months? Yes. Number of falls 2 falls occurred during exercise (one with stability ball and one standing holding onto a chair)  LIVING ENVIRONMENT: Lives with: lives with their spouse Lives in: House/apartment Stairs: No Has following equipment at home: Single point cane, Environmental consultant - 4 wheeled, Wheelchair (manual), Electronics engineer, and Grab bars  PLOF: Independent  PATIENT GOALS:  "To stop this head from spinning."  OBJECTIVE:   DIAGNOSTIC FINDINGS: n/a  COGNITION: Overall cognitive status: Within functional limits for tasks assessed, but pt does state that she has some short term memory problems.   SENSATION: WFL  POSTURE: No Significant postural limitations   Cervical ROM:   WFL  STRENGTH:  BUE is WFL. BLE strength is grossly 4/5 throughout.  Some spasms/increased tone noted on LLE with doing MMT   GAIT: Gait pattern: decreased stride length and ataxic Distance walked: 100 ft Assistive device utilized: None Level of assistance: Modified independence Comments: Pt reports some increased pain with standing and walking for increased distances.  FUNCTIONAL TESTs:  5 times sit to stand: 15.1 sec without UE use  PATIENT SURVEYS:  02/21/2022:  DHI 0% DHI Total Score: 0 / 100 Physical Score: 0 / 28 Emotional Score: 0 / 36 Functional Score: 0 /  36   VESTIBULAR ASSESSMENT   GENERAL OBSERVATION: Pt with ataxia noted during ambulation with some tremors noted.    SYMPTOM BEHAVIOR:   Subjective history: Pt reports dizziness since June, that is abnormal than the dizziness she typically experiences with her MS.   Non-Vestibular symptoms: headaches   Type of dizziness: Spinning/Vertigo   Frequency: daily   Duration: less than 5 minutes typically   Aggravating factors:  unknown   Relieving factors: head stationary and avoid busy/distracting environments   Progression of symptoms: worse   OCULOMOTOR EXAM:   Ocular Alignment: normal   Ocular ROM: No Limitations   Spontaneous Nystagmus: absent   Gaze-Induced Nystagmus: absent   Smooth Pursuits: intact   Saccades: intact   Convergence/Divergence: difficulty with convergence   VESTIBULAR - OCULAR REFLEX:    Positive head thrust test to either direction of head movement    POSITIONAL TESTING: Right Dix-Hallpike: nystagmus noted lasting approx 15 sec   OTHOSTATICS: not done   VESTIBULAR TREATMENT:  TODAY'S TREATMENT: 03/24/2022: Nustep level 4 x6 min with PT present to discuss progress/status Seated with 4# ankle weights:  Heel/toe raises, LAQ, marching, and hip abduction  scissors.  BLE 2x10 each Standing on blue foam performing ball toss 2x20 Sit to/from stand with yellow loop around knees 2x10 Seated piriformis and hamstring stretch 2x20 sec bilat Standing counter stretch 2x20 sec at barre Heel raises 2x10 at barre   02/21/2022: Nustep level 4 x6 min with PT present to discuss progress/status DHI 0/100 Seated with 1.5# ankle weights:  Heel/toe raises, LAQ, marching, and hip abduction scissors.  BLE 2x10 each Seated hip adduction ball squeeze 2x10 Seated hamstring curls with red tband 2x10 bilat Sit to/from stand with red tband around knees to maintain knee position  02/18/2022: Marye Round negative bilateral sides Nustep level 3 x5 min with PT present to discuss  progress/status Standing on blue foam 2x1 min with PT providing CGA and unsteady standing balance Ambulation with RW x200 ft with close SBA with cuing for relaxing shoulders Seated with 1# ankle weights:  LAQ and marching  BLE 2x10    PATIENT EDUCATION: Education details: Pt issued HEP Person educated: Patient Education method: Explanation, Demonstration, and Handouts Education comprehension: verbalized understanding and returned demonstration  HOME EXERCISE PROGRAM: Access Code: [email or web address] URL: [email or web address] Date: 02/21/2022 Prepared by: Shelby Dubin Deavon Podgorski  Exercises - Pencil Pushups  - 1 x daily - 7 x weekly - 2 sets - 10 reps - Seated Gaze Stabilization with Head Nod  - 1 x daily - 7 x weekly - 3 sets - 10 reps - Seated Gaze Stabilization with Head Rotation  - 1 x daily - 7 x weekly - 3 sets - 10 reps - Seated Long Arc Quad  - 1 x daily - 7 x weekly - 2 sets - 10 reps - Seated March  - 1 x daily - 7 x weekly - 2 sets - 10 reps - Sit to Stand Without Arm Support  - 1 x daily - 7 x weekly - 2 sets - 10 reps - Seated Hip Adduction Isometrics with Ball  - 1 x daily - 7 x weekly - 2 sets - 10 reps  GOALS: Goals reviewed with patient? Yes  SHORT TERM GOALS: Target date: 03/08/2022   Pt will be independent with initial HEP. Baseline: Goal status: MET  2.  Pt will undergo Dizziness Handicapped Inventory assessment to assess balance. Baseline:  Goal status: MET   LONG TERM GOALS: Target date: 04/08/2022    Pt will be independent with advanced HEP. Baseline:  Goal status: IN PROGRESS  2.  Pt will have negative Hallpike-Dix consistently by last PT session. Baseline: positive on right Goal status: IN PROGRESS  3.  Pt will increase BLE strength to at least 4+ to 5-/5 grossly throughout to allow for improved independence with functional mobility. Baseline:  Goal status: IN PROGRESS  4.  Pt will report at least a 70% improvement in vestibular symptoms to  decrease risk of falling. Baseline:  Goal status: MET on 03/24/2022  5.  Pt will improve score on dizziness handicapped inventory by at least 15% to demonstrate decreased risk of falling. Baseline:  Goal status: MET on 02/21/2022    ASSESSMENT:  CLINICAL IMPRESSION: Ms Xu presents to skilled PT with continued denial of dizziness.  Pt states that her primary complaint is back pain today. Pt overall is progressing with increased strengthening and balance and was able to ambulate during session today without assistive device.  Pt continues to require skilled PT to progress towards goal related activities.   OBJECTIVE IMPAIRMENTS decreased balance, decreased strength, decreased safety awareness, dizziness, and pain.  ACTIVITY LIMITATIONS standing, transfers, and locomotion level  PARTICIPATION LIMITATIONS: cleaning and community activity  PERSONAL FACTORS 1-2 comorbidities: MS, sleep apnea  are also affecting patient's functional outcome.   REHAB POTENTIAL: Good  CLINICAL DECISION MAKING: Evolving/moderate complexity  EVALUATION COMPLEXITY: Moderate   PLAN: PT FREQUENCY: 2x/week  PT DURATION: 8 weeks  PLANNED INTERVENTIONS: Therapeutic exercises, Therapeutic activity, Neuromuscular re-education, Balance training, Gait training, Patient/Family education, Self Care, Joint mobilization, Stair training, Vestibular training, Canalith repositioning, Aquatic Therapy, Dry Needling, Electrical stimulation, Spinal manipulation, Spinal mobilization, Cryotherapy, Moist heat, Taping, Vasopneumatic device, Traction, Ultrasound, Ionotophoresis 71m/ml Dexamethasone, Manual therapy, and Re-evaluation  PLAN FOR NEXT SESSION: Assess and progress HEP as indicated, vestibular rehab as indicated, strengthening, balance   SJuel Burrow PT 03/24/2022, 1:29 PM   [facility] SRockdaleGLeggett [place] [address]Phone # [phone] [phone]

## 2022-03-29 ENCOUNTER — Encounter: Payer: Self-pay | Admitting: Rehabilitative and Restorative Service Providers"

## 2022-03-29 ENCOUNTER — Ambulatory Visit: Payer: Medicare PPO | Attending: Nurse Practitioner | Admitting: Rehabilitative and Restorative Service Providers"

## 2022-03-29 DIAGNOSIS — M6281 Muscle weakness (generalized): Secondary | ICD-10-CM | POA: Insufficient documentation

## 2022-03-29 DIAGNOSIS — R2689 Other abnormalities of gait and mobility: Secondary | ICD-10-CM | POA: Insufficient documentation

## 2022-03-29 DIAGNOSIS — R42 Dizziness and giddiness: Secondary | ICD-10-CM | POA: Diagnosis not present

## 2022-03-29 NOTE — Therapy (Signed)
OUTPATIENT PHYSICAL THERAPY TREATMENT NOTE     Patient Name: Angel Maddox MRN: [id number] DOB:February 13, 1956, 66 y.o., female Today's Date: 03/29/2022  PCP: Monico Blitz, MD REFERRING PROVIDER: Ricarda Frame, FNP   PT End of Session - 03/29/22 1439     Visit Number 5    Date for PT Re-Evaluation 04/08/22    Authorization Type [employer]here    Authorization Time Period 02/15/22 - 04/08/22    Authorization - Visit Number 5    Authorization - Number of Visits 10    PT Start Time 5400    PT Stop Time 1515    PT Time Calculation (min) 39 min    Activity Tolerance Patient tolerated treatment well    Behavior During Therapy [facility] for tasks assessed/performed             Past Medical History:  Diagnosis Date   Anxiety    Arthritis    osteo   Atypical mole 08/10/2021   Right Abdomen (side) upper (severe)   Bipolar 1 disorder ([place])    Cervicalgia    Chronic kidney disease    kidney stone   COPD (chronic obstructive pulmonary disease) ([place])    new diagnosis- now sees pulmonologist   Depression    Deviated septum    Elevated liver enzymes    She says "normal now"   GERD (gastroesophageal reflux disease)    H/O hiatal hernia    Headache    Hip fracture ([place]) 2017   left   History of kidney stones    4-5 yrs ago   Hypertension    Multiple sclerosis ([place])     Had for 15 years   Multiple sclerosis ([place])    dx 1999   Tremors of nervous system    Past Surgical History:  Procedure Laterality Date   ANTERIOR CERVICAL DECOMP/DISCECTOMY FUSION N/A 07/28/2017   Procedure: CERVICAL FOUR-FIVE, CERVICAL FIVE-SIX ANTERIOR CERVICAL DECOMPRESSION/DISCECTOMY FUSION;  Surgeon: Eustace Moore, MD;  Location: [place];  Service: Neurosurgery;  Laterality: N/A;   COLONOSCOPY  2008   Dr.Kaplan   EYE SURGERY     [facility]   HIP PINNING,CANNULATED Left 11/09/2015   Procedure: CANNULATED HIP PINNING;  Surgeon: Rod Can, MD;  [facility];  Service: Orthopedics;   Laterality: Left;   NASAL SEPTOPLASTY W/ TURBINOPLASTY Bilateral 01/12/2021   Procedure: NASAL SEPTOPLASTY WITH BILATERAL  TURBINATE REDUCTION;  Surgeon: Leta Baptist, MD;  Location: [place];  Service: ENT;  Laterality: Bilateral;   TUBAL LIGATION     Patient Active Problem List   Diagnosis Date Noted   COVID-19 virus infection 03/20/2020   'light-for-dates' infant with signs of fetal malnutrition 05/06/2019   Seizure-like activity ([place]) 12/27/2018   Dyslipidemia 12/20/2018   Hyponatremia syndrome 07/04/2018   Dyspnea on exertion 07/03/2018   COPD mixed type ([place]) 07/03/2018   MDD (major depressive disorder) 06/28/2018   GAD (generalized anxiety disorder) 06/28/2018   Major depressive disorder, recurrent episode, moderate ([place]) 04/17/2018   Head injury 12/08/2017   Cervical vertebral fusion 07/28/2017   DDD (degenerative disc disease), cervical 07/10/2017   DDD (degenerative disc disease), thoracic 07/10/2017   DDD (degenerative disc disease), lumbar 07/10/2017   Facet arthritis of cervical region 07/10/2017   Abnormal liver function tests 03/07/2017   Hypokalemia 03/07/2017   Gastroesophageal reflux disease with esophagitis 03/07/2017   Esophageal dysphagia 03/07/2017   Chronic idiopathic constipation 02/01/2017   Fatigue 02/01/2017   HSV-1 infection 01/12/2017   Osteoporosis with  pathological fracture, with routine healing, subsequent encounter 08/30/2016   MDD (major depressive disorder), recurrent episode, severe ([place]) 03/29/2016   Estrogen deficiency 12/15/2015   MS (multiple sclerosis) ([place]) 12/15/2015   Tremor 12/15/2015   Healthcare maintenance 12/15/2015   Essential hypertension    Protein-calorie malnutrition, severe 11/09/2015   Bipolar 1 disorder, depressed ([place]) 02/11/2015    ONSET DATE: Reports 2-3 months ago  REFERRING DIAG: R42 Vertigo  THERAPY DIAG:  Balance problem  Other abnormalities of gait and mobility  Muscle weakness  (generalized)  Dizziness and giddiness  Rationale for Evaluation and Treatment Rehabilitation  SUBJECTIVE:   SUBJECTIVE STATEMENT: Pt continues to deny dizziness.  Does state that she can tell that her balance is improving.  Pt reports that she is fatigued currently from overdoing it at the gym yesterday and again this morning.  PERTINENT HISTORY: MS, Depression, Immunodeficiency, Sleep Apnea    PAIN:  Are you having pain? Yes: NPRS scale: 5/10 Pain location: back and cervical/headache Pain description: "just hurts" Aggravating factors: exercise Relieving factors: pain medication  PRECAUTIONS: Fall  WEIGHT BEARING RESTRICTIONS No  FALLS: Has patient fallen in last 6 months? Yes. Number of falls 2 falls occurred during exercise (one with stability ball and one standing holding onto a chair)  LIVING ENVIRONMENT: Lives with: lives with their spouse Lives in: House/apartment Stairs: No Has following equipment at home: Single point cane, Environmental consultant - 4 wheeled, Wheelchair (manual), Electronics engineer, and Grab bars  PLOF: Independent  PATIENT GOALS:  "To stop this head from spinning."  OBJECTIVE:   DIAGNOSTIC FINDINGS: n/a  COGNITION: Overall cognitive status: Within functional limits for tasks assessed, but pt does state that she has some short term memory problems.   SENSATION: WFL  POSTURE: No Significant postural limitations   Cervical ROM:   WFL  STRENGTH:  BUE is WFL. BLE strength is grossly 4/5 throughout.  Some spasms/increased tone noted on LLE with doing MMT   GAIT: Gait pattern: decreased stride length and ataxic Distance walked: 100 ft Assistive device utilized: None Level of assistance: Modified independence Comments: Pt reports some increased pain with standing and walking for increased distances.  FUNCTIONAL TESTs:  5 times sit to stand: 15.1 sec without UE use  PATIENT SURVEYS:  02/21/2022:  DHI 0% DHI Total Score: 0 / 100 Physical Score: 0 /  28 Emotional Score: 0 / 36 Functional Score: 0 / 36   VESTIBULAR ASSESSMENT   GENERAL OBSERVATION: Pt with ataxia noted during ambulation with some tremors noted.    SYMPTOM BEHAVIOR:   Subjective history: Pt reports dizziness since June, that is abnormal than the dizziness she typically experiences with her MS.   Non-Vestibular symptoms: headaches   Type of dizziness: Spinning/Vertigo   Frequency: daily   Duration: less than 5 minutes typically   Aggravating factors:  unknown   Relieving factors: head stationary and avoid busy/distracting environments   Progression of symptoms: worse   OCULOMOTOR EXAM:   Ocular Alignment: normal   Ocular ROM: No Limitations   Spontaneous Nystagmus: absent   Gaze-Induced Nystagmus: absent   Smooth Pursuits: intact   Saccades: intact   Convergence/Divergence: difficulty with convergence   VESTIBULAR - OCULAR REFLEX:    Positive head thrust test to either direction of head movement    POSITIONAL TESTING: Right Dix-Hallpike: nystagmus noted lasting approx 15 sec   OTHOSTATICS: not done   VESTIBULAR TREATMENT:  TODAY'S TREATMENT:  03/29/2022: Nustep level 1 x6 min with PT present to discuss progress/status Seated  piriformis and hamstring stretch 1x30 sec bilat Education about stretching and to not overdo the stretching, pt verbalizes understanding Seated 3 way green pball rollout 5x10 sec each Seated TA contraction by pushing purple ball into thighs 2x10 Manual Therapy:  Addaday to lumbar, glutes, hamstrings, IT band, calves.  BLE x10 minutes secondary to pain from workout earlier Standing on blue foam performing ball toss 2x20 Sit to/from stand from blue foam x10 reps Rows with green tband standing on blue foam 2x10 bilat Stirring the pot CW and CCW x10 each bilat with green tband standing on blue foam   03/24/2022: Nustep level 4 x6 min with PT present to discuss progress/status Seated with 4# ankle weights:  Heel/toe raises, LAQ,  marching, and hip abduction scissors.  BLE 2x10 each Standing on blue foam performing ball toss 2x20 Sit to/from stand with yellow loop around knees 2x10 Seated piriformis and hamstring stretch 2x20 sec bilat Standing counter stretch 2x20 sec at barre Heel raises 2x10 at barre   02/21/2022: Nustep level 4 x6 min with PT present to discuss progress/status DHI 0/100 Seated with 1.5# ankle weights:  Heel/toe raises, LAQ, marching, and hip abduction scissors.  BLE 2x10 each Seated hip adduction ball squeeze 2x10 Seated hamstring curls with red tband 2x10 bilat Sit to/from stand with red tband around knees to maintain knee position    PATIENT EDUCATION: Education details: Pt issued HEP Person educated: Patient Education method: Explanation, Demonstration, and Handouts Education comprehension: verbalized understanding and returned demonstration  HOME EXERCISE PROGRAM: Access Code: [email or web address] URL: [email or web address] Date: 02/21/2022 Prepared by: Shelby Dubin Newell Wafer  Exercises - Pencil Pushups  - 1 x daily - 7 x weekly - 2 sets - 10 reps - Seated Gaze Stabilization with Head Nod  - 1 x daily - 7 x weekly - 3 sets - 10 reps - Seated Gaze Stabilization with Head Rotation  - 1 x daily - 7 x weekly - 3 sets - 10 reps - Seated Long Arc Quad  - 1 x daily - 7 x weekly - 2 sets - 10 reps - Seated March  - 1 x daily - 7 x weekly - 2 sets - 10 reps - Sit to Stand Without Arm Support  - 1 x daily - 7 x weekly - 2 sets - 10 reps - Seated Hip Adduction Isometrics with Ball  - 1 x daily - 7 x weekly - 2 sets - 10 reps  GOALS: Goals reviewed with patient? Yes  SHORT TERM GOALS: Target date: 03/08/2022   Pt will be independent with initial HEP. Baseline: Goal status: MET  2.  Pt will undergo Dizziness Handicapped Inventory assessment to assess balance. Baseline:  Goal status: MET   LONG TERM GOALS: Target date: 04/08/2022    Pt will be independent with advanced HEP. Baseline:   Goal status: IN PROGRESS  2.  Pt will have negative Hallpike-Dix consistently by last PT session. Baseline: positive on right Goal status: IN PROGRESS  3.  Pt will increase BLE strength to at least 4+ to 5-/5 grossly throughout to allow for improved independence with functional mobility. Baseline:  Goal status: IN PROGRESS  4.  Pt will report at least a 70% improvement in vestibular symptoms to decrease risk of falling. Baseline:  Goal status: MET on 03/24/2022  5.  Pt will improve score on dizziness handicapped inventory by at least 15% to demonstrate decreased risk of falling. Baseline:  Goal status: MET on 02/21/2022    ASSESSMENT:  CLINICAL IMPRESSION:  Ms Caprio presents to skilled PT with continued denial of dizziness, but reported fatigue secondary to working out for 2+ hours yesterday and again today.  Pt educated about the importance of not pushing her body to exhaustion, especially with her MS, as it can take her body longer to recover if she pushes too much.  Pt verbalizes her understanding and states that she will not go work out tomorrow and will allow her body time to recovery.  Utilized Addaday for manual therapy secondary to large area of fatigued muscles to allow for decreased soreness and improved muscle recovery.  Pt able to progress with standing balance and core stability during session today.  Pt continues to require skilled PT to progress towards goal related activities.   OBJECTIVE IMPAIRMENTS decreased balance, decreased strength, decreased safety awareness, dizziness, and pain.   ACTIVITY LIMITATIONS standing, transfers, and locomotion level  PARTICIPATION LIMITATIONS: cleaning and community activity  PERSONAL FACTORS 1-2 comorbidities: MS, sleep apnea  are also affecting patient's functional outcome.   REHAB POTENTIAL: Good  CLINICAL DECISION MAKING: Evolving/moderate complexity  EVALUATION COMPLEXITY: Moderate   PLAN: PT FREQUENCY: 2x/week  PT  DURATION: 8 weeks  PLANNED INTERVENTIONS: Therapeutic exercises, Therapeutic activity, Neuromuscular re-education, Balance training, Gait training, Patient/Family education, Self Care, Joint mobilization, Stair training, Vestibular training, Canalith repositioning, Aquatic Therapy, Dry Needling, Electrical stimulation, Spinal manipulation, Spinal mobilization, Cryotherapy, Moist heat, Taping, Vasopneumatic device, Traction, Ultrasound, Ionotophoresis 21m/ml Dexamethasone, Manual therapy, and Re-evaluation  PLAN FOR NEXT SESSION: Assess and progress HEP as indicated, vestibular rehab as indicated, strengthening, balance   SJuel Burrow PT 03/29/2022, 3:31 PM   [facility] [place] [place] [address]Phone # [phone] [phone]

## 2022-03-31 ENCOUNTER — Encounter: Payer: Self-pay | Admitting: Rehabilitative and Restorative Service Providers"

## 2022-03-31 ENCOUNTER — Ambulatory Visit: Payer: Medicare PPO | Admitting: Rehabilitative and Restorative Service Providers"

## 2022-03-31 DIAGNOSIS — R42 Dizziness and giddiness: Secondary | ICD-10-CM

## 2022-03-31 DIAGNOSIS — M6281 Muscle weakness (generalized): Secondary | ICD-10-CM

## 2022-03-31 DIAGNOSIS — R2689 Other abnormalities of gait and mobility: Secondary | ICD-10-CM | POA: Diagnosis not present

## 2022-03-31 NOTE — Therapy (Signed)
OUTPATIENT PHYSICAL THERAPY TREATMENT NOTE     Patient Name: Angel Maddox MRN: [id number] DOB:03-10-1956, 66 y.o., female Today's Date: 03/31/2022  PCP: Monico Blitz, MD REFERRING PROVIDER: Ricarda Frame, FNP   PT End of Session - 03/31/22 1235     Visit Number 6    Date for PT Re-Evaluation 04/08/22    Authorization Type [employer]    Authorization Time Period 02/15/22 - 04/08/22    Authorization - Visit Number 6    Authorization - Number of Visits 10    PT Start Time 1230    PT Stop Time 1310    PT Time Calculation (min) 40 min    Activity Tolerance Patient tolerated treatment well    Behavior During Therapy [facility] for tasks assessed/performed             Past Medical History:  Diagnosis Date   Anxiety    Arthritis    osteo   Atypical mole 08/10/2021   Right Abdomen (side) upper (severe)   Bipolar 1 disorder ([place])    Cervicalgia    Chronic kidney disease    kidney stone   COPD (chronic obstructive pulmonary disease) ([place])    new diagnosis- now sees pulmonologist   Depression    Deviated septum    Elevated liver enzymes    She says "normal now"   GERD (gastroesophageal reflux disease)    H/O hiatal hernia    Headache    Hip fracture ([place]) 2017   left   History of kidney stones    4-5 yrs ago   Hypertension    Multiple sclerosis ([place])     Had for 15 years   Multiple sclerosis ([place])    dx 1999   Tremors of nervous system    Past Surgical History:  Procedure Laterality Date   ANTERIOR CERVICAL DECOMP/DISCECTOMY FUSION N/A 07/28/2017   Procedure: CERVICAL FOUR-FIVE, CERVICAL FIVE-SIX ANTERIOR CERVICAL DECOMPRESSION/DISCECTOMY FUSION;  Surgeon: Eustace Moore, MD;  Location: [place];  Service: Neurosurgery;  Laterality: N/A;   COLONOSCOPY  2008   Dr.Kaplan   EYE SURGERY     [facility]   HIP PINNING,CANNULATED Left 11/09/2015   Procedure: CANNULATED HIP PINNING;  Surgeon: Rod Can, MD;  Location: [facility];  Service: Orthopedics;   Laterality: Left;   NASAL SEPTOPLASTY W/ TURBINOPLASTY Bilateral 01/12/2021   Procedure: NASAL SEPTOPLASTY WITH BILATERAL  TURBINATE REDUCTION;  Surgeon: Leta Baptist, MD;  Location: [place];  Service: ENT;  Laterality: Bilateral;   TUBAL LIGATION     Patient Active Problem List   Diagnosis Date Noted   COVID-19 virus infection 03/20/2020   'light-for-dates' infant with signs of fetal malnutrition 05/06/2019   Seizure-like activity ([place]) 12/27/2018   Dyslipidemia 12/20/2018   Hyponatremia syndrome 07/04/2018   Dyspnea on exertion 07/03/2018   COPD mixed type ([place]) 07/03/2018   MDD (major depressive disorder) 06/28/2018   GAD (generalized anxiety disorder) 06/28/2018   Major depressive disorder, recurrent episode, moderate ([place]) 04/17/2018   Head injury 12/08/2017   Cervical vertebral fusion 07/28/2017   DDD (degenerative disc disease), cervical 07/10/2017   DDD (degenerative disc disease), thoracic 07/10/2017   DDD (degenerative disc disease), lumbar 07/10/2017   Facet arthritis of cervical region 07/10/2017   Abnormal liver function tests 03/07/2017   Hypokalemia 03/07/2017   Gastroesophageal reflux disease with esophagitis 03/07/2017   Esophageal dysphagia 03/07/2017   Chronic idiopathic constipation 02/01/2017   Fatigue 02/01/2017   HSV-1 infection 01/12/2017   Osteoporosis with  pathological fracture, with routine healing, subsequent encounter 08/30/2016   MDD (major depressive disorder), recurrent episode, severe ([place]) 03/29/2016   Estrogen deficiency 12/15/2015   MS (multiple sclerosis) ([place]) 12/15/2015   Tremor 12/15/2015   Healthcare maintenance 12/15/2015   Essential hypertension    Protein-calorie malnutrition, severe 11/09/2015   Bipolar 1 disorder, depressed ([place]) 02/11/2015    ONSET DATE: Reports 2-3 months ago  REFERRING DIAG: R42 Vertigo  THERAPY DIAG:  Balance problem  Other abnormalities of gait and mobility  Muscle weakness  (generalized)  Dizziness and giddiness  Rationale for Evaluation and Treatment Rehabilitation  SUBJECTIVE:   SUBJECTIVE STATEMENT: Pt continues to deny dizziness.  States that she only worked out for an hour this morning and does not feel as fatigued.  PERTINENT HISTORY: MS, Depression, Immunodeficiency, Sleep Apnea    PAIN:  Are you having pain? Yes: NPRS scale: 3/10 Pain location: cervical/headache Pain description: "just hurts" Aggravating factors: exercise Relieving factors: pain medication  PRECAUTIONS: Fall  WEIGHT BEARING RESTRICTIONS No  FALLS: Has patient fallen in last 6 months? Yes. Number of falls 2 falls occurred during exercise (one with stability ball and one standing holding onto a chair)  LIVING ENVIRONMENT: Lives with: lives with their spouse Lives in: House/apartment Stairs: No Has following equipment at home: Single point cane, Environmental consultant - 4 wheeled, Wheelchair (manual), Electronics engineer, and Grab bars  PLOF: Independent  PATIENT GOALS:  "To stop this head from spinning."  OBJECTIVE:   DIAGNOSTIC FINDINGS: n/a  COGNITION: Overall cognitive status: Within functional limits for tasks assessed, but pt does state that she has some short term memory problems.   SENSATION: WFL  POSTURE: No Significant postural limitations   Cervical ROM:   WFL  STRENGTH:  BUE is WFL. BLE strength is grossly 4/5 throughout.  Some spasms/increased tone noted on LLE with doing MMT   GAIT: Gait pattern: decreased stride length and ataxic Distance walked: 100 ft Assistive device utilized: None Level of assistance: Modified independence Comments: Pt reports some increased pain with standing and walking for increased distances.  FUNCTIONAL TESTs:  5 times sit to stand: 15.1 sec without UE use  PATIENT SURVEYS:  02/21/2022:  DHI 0% DHI Total Score: 0 / 100 Physical Score: 0 / 28 Emotional Score: 0 / 36 Functional Score: 0 / 36   VESTIBULAR  ASSESSMENT   GENERAL OBSERVATION: Pt with ataxia noted during ambulation with some tremors noted.    SYMPTOM BEHAVIOR:   Subjective history: Pt reports dizziness since June, that is abnormal than the dizziness she typically experiences with her MS.   Non-Vestibular symptoms: headaches   Type of dizziness: Spinning/Vertigo   Frequency: daily   Duration: less than 5 minutes typically   Aggravating factors:  unknown   Relieving factors: head stationary and avoid busy/distracting environments   Progression of symptoms: worse   OCULOMOTOR EXAM:   Ocular Alignment: normal   Ocular ROM: No Limitations   Spontaneous Nystagmus: absent   Gaze-Induced Nystagmus: absent   Smooth Pursuits: intact   Saccades: intact   Convergence/Divergence: difficulty with convergence   VESTIBULAR - OCULAR REFLEX:    Positive head thrust test to either direction of head movement    POSITIONAL TESTING: Right Dix-Hallpike: nystagmus noted lasting approx 15 sec   OTHOSTATICS: not done   VESTIBULAR TREATMENT:  TODAY'S TREATMENT:  03/31/2022: Nustep level 6 x6 min with PT present to discuss progress/status Seated piriformis and hamstring stretch 1x30 sec bilat Seated 3 way green pball rollout 5x10 sec  each 4 way pelvic tilt seated on dynadisc x20 reps Seated with 4# ankle weights:  Heel/toe raises, LAQ, marching, and hip abduction scissors.  BLE 2x10 each Tandem gait holding to barre, as needed 4x10 ft Single leg tap to 2 cones alt LE 2x10   03/29/2022: Nustep level 1 x6 min with PT present to discuss progress/status Seated piriformis and hamstring stretch 1x30 sec bilat Education about stretching and to not overdo the stretching, pt verbalizes understanding Seated 3 way green pball rollout 5x10 sec each Seated TA contraction by pushing purple ball into thighs 2x10 Manual Therapy:  Addaday to lumbar, glutes, hamstrings, IT band, calves.  BLE x10 minutes secondary to pain from workout earlier Standing  on blue foam performing ball toss 2x20 Sit to/from stand from blue foam x10 reps Rows with green tband standing on blue foam 2x10 bilat Stirring the pot CW and CCW x10 each bilat with green tband standing on blue foam   03/24/2022: Nustep level 4 x6 min with PT present to discuss progress/status Seated with 4# ankle weights:  Heel/toe raises, LAQ, marching, and hip abduction scissors.  BLE 2x10 each Standing on blue foam performing ball toss 2x20 Sit to/from stand with yellow loop around knees 2x10 Seated piriformis and hamstring stretch 2x20 sec bilat Standing counter stretch 2x20 sec at barre Heel raises 2x10 at barre     PATIENT EDUCATION: Education details: Pt issued HEP Person educated: Patient Education method: Explanation, Demonstration, and Handouts Education comprehension: verbalized understanding and returned demonstration  HOME EXERCISE PROGRAM: Access Code: [email or web address] URL: [email or web address] Date: 02/21/2022 Prepared by: Shelby Dubin Reine Bristow  Exercises - Pencil Pushups  - 1 x daily - 7 x weekly - 2 sets - 10 reps - Seated Gaze Stabilization with Head Nod  - 1 x daily - 7 x weekly - 3 sets - 10 reps - Seated Gaze Stabilization with Head Rotation  - 1 x daily - 7 x weekly - 3 sets - 10 reps - Seated Long Arc Quad  - 1 x daily - 7 x weekly - 2 sets - 10 reps - Seated March  - 1 x daily - 7 x weekly - 2 sets - 10 reps - Sit to Stand Without Arm Support  - 1 x daily - 7 x weekly - 2 sets - 10 reps - Seated Hip Adduction Isometrics with Ball  - 1 x daily - 7 x weekly - 2 sets - 10 reps  GOALS: Goals reviewed with patient? Yes  SHORT TERM GOALS: Target date: 03/08/2022   Pt will be independent with initial HEP. Baseline: Goal status: MET  2.  Pt will undergo Dizziness Handicapped Inventory assessment to assess balance. Baseline:  Goal status: MET   LONG TERM GOALS: Target date: 04/08/2022    Pt will be independent with advanced HEP. Baseline:  Goal  status: IN PROGRESS  2.  Pt will have negative Hallpike-Dix consistently by last PT session. Baseline: positive on right Goal status: IN PROGRESS  3.  Pt will increase BLE strength to at least 4+ to 5-/5 grossly throughout to allow for improved independence with functional mobility. Baseline:  Goal status: IN PROGRESS  4.  Pt will report at least a 70% improvement in vestibular symptoms to decrease risk of falling. Baseline:  Goal status: MET on 03/24/2022  5.  Pt will improve score on dizziness handicapped inventory by at least 15% to demonstrate decreased risk of falling. Baseline:  Goal status: MET on 02/21/2022    ASSESSMENT:  CLINICAL IMPRESSION: Ms Morgano presents to skilled PT with anticipated progress towards goals.  She reports that she still has not had any dizziness and that she felt better today after only exercising for 1 hour in the gym.  Pt continues to progress with balance during PT session and does require occasional UE support from barre during standing balance for tandem gait and single leg stance cone tap.  Pt is on track for possible discharge next week if progress continues.   OBJECTIVE IMPAIRMENTS decreased balance, decreased strength, decreased safety awareness, dizziness, and pain.   ACTIVITY LIMITATIONS standing, transfers, and locomotion level  PARTICIPATION LIMITATIONS: cleaning and community activity  PERSONAL FACTORS 1-2 comorbidities: MS, sleep apnea  are also affecting patient's functional outcome.   REHAB POTENTIAL: Good  CLINICAL DECISION MAKING: Evolving/moderate complexity  EVALUATION COMPLEXITY: Moderate   PLAN: PT FREQUENCY: 2x/week  PT DURATION: 8 weeks  PLANNED INTERVENTIONS: Therapeutic exercises, Therapeutic activity, Neuromuscular re-education, Balance training, Gait training, Patient/Family education, Self Care, Joint mobilization, Stair training, Vestibular training, Canalith repositioning, Aquatic Therapy, Dry Needling,  Electrical stimulation, Spinal manipulation, Spinal mobilization, Cryotherapy, Moist heat, Taping, Vasopneumatic device, Traction, Ultrasound, Ionotophoresis 5m/ml Dexamethasone, Manual therapy, and Re-evaluation  PLAN FOR NEXT SESSION: Assess and progress HEP as indicated, vestibular rehab as indicated, strengthening, balance   SJuel Burrow PT 03/31/2022, 1:23 PM   [facility] [place]ast [place] [address]Phone # [phone] [phone]

## 2022-04-05 ENCOUNTER — Encounter: Payer: Self-pay | Admitting: Rehabilitative and Restorative Service Providers"

## 2022-04-05 ENCOUNTER — Ambulatory Visit: Payer: Medicare PPO | Admitting: Rehabilitative and Restorative Service Providers"

## 2022-04-05 DIAGNOSIS — R42 Dizziness and giddiness: Secondary | ICD-10-CM | POA: Diagnosis not present

## 2022-04-05 DIAGNOSIS — R2689 Other abnormalities of gait and mobility: Secondary | ICD-10-CM

## 2022-04-05 DIAGNOSIS — M6281 Muscle weakness (generalized): Secondary | ICD-10-CM

## 2022-04-05 NOTE — Therapy (Signed)
OUTPATIENT PHYSICAL THERAPY TREATMENT NOTE     Patient Name: NEENA BEECHAM MRN: [id number] DOB:[birth year], 66 y.o., female Today's Date: 04/05/2022  PCP: Monico Blitz, MD REFERRING PROVIDER: Ricarda Frame, FNP   PT End of Session - 04/05/22 1450     Visit Number 7    Date for PT Re-Evaluation 04/08/22    Authorization Type [employer]    Authorization Time Period 02/15/22 - 04/08/22    Authorization - Visit Number 7    Authorization - Number of Visits 10    Progress Note Due on Visit 10    PT Start Time 1941    PT Stop Time 1525    PT Time Calculation (min) 41 min    Activity Tolerance Patient tolerated treatment well    Behavior During Therapy [facility] for tasks assessed/performed             Past Medical History:  Diagnosis Date   Anxiety    Arthritis    osteo   Atypical mole 08/10/2021   Right Abdomen (side) upper (severe)   Bipolar 1 disorder ([place])    Cervicalgia    Chronic kidney disease    kidney stone   COPD (chronic obstructive pulmonary disease) ([place])    new diagnosis- now sees pulmonologist   Depression    Deviated septum    Elevated liver enzymes    She says "normal now"   GERD (gastroesophageal reflux disease)    H/O hiatal hernia    Headache    Hip fracture ([place]) 2017   left   History of kidney stones    4-5 yrs ago   Hypertension    Multiple sclerosis ([place])     Had for 15 years   Multiple sclerosis ([place])    dx 1999   Tremors of nervous system    Past Surgical History:  Procedure Laterality Date   ANTERIOR CERVICAL DECOMP/DISCECTOMY FUSION N/A 07/28/2017   Procedure: CERVICAL FOUR-FIVE, CERVICAL FIVE-SIX ANTERIOR CERVICAL DECOMPRESSION/DISCECTOMY FUSION;  Surgeon: Eustace Moore, MD;  Location: [facility];  Service: Neurosurgery;  Laterality: N/A;   COLONOSCOPY  2008   Dr.Kaplan   EYE SURGERY     [facility]   HIP PINNING,CANNULATED Left 11/09/2015   Procedure: CANNULATED HIP PINNING;  Surgeon: Rod Can, MD;  Location:  [facility];  Service: Orthopedics;  Laterality: Left;   NASAL SEPTOPLASTY W/ TURBINOPLASTY Bilateral 01/12/2021   Procedure: NASAL SEPTOPLASTY WITH BILATERAL  TURBINATE REDUCTION;  Surgeon: Leta Baptist, MD;  Location: [facility];  Service: ENT;  Laterality: Bilateral;   TUBAL LIGATION     Patient Active Problem List   Diagnosis Date Noted   COVID-19 virus infection 03/20/2020   'light-for-dates' infant with signs of fetal malnutrition 05/06/2019   Seizure-like activity ([place]) 12/27/2018   Dyslipidemia 12/20/2018   Hyponatremia syndrome 07/04/2018   Dyspnea on exertion 07/03/2018   COPD mixed type ([place]) 07/03/2018   MDD (major depressive disorder) 06/28/2018   GAD (generalized anxiety disorder) 06/28/2018   Major depressive disorder, recurrent episode, moderate ([place]) 04/17/2018   Head injury 12/08/2017   Cervical vertebral fusion 07/28/2017   DDD (degenerative disc disease), cervical 07/10/2017   DDD (degenerative disc disease), thoracic 07/10/2017   DDD (degenerative disc disease), lumbar 07/10/2017   Facet arthritis of cervical region 07/10/2017   Abnormal liver function tests 03/07/2017   Hypokalemia 03/07/2017   Gastroesophageal reflux disease with esophagitis 03/07/2017   Esophageal dysphagia 03/07/2017   Chronic idiopathic constipation 02/01/2017   Fatigue 02/01/2017  HSV-1 infection 01/12/2017   Osteoporosis with pathological fracture, with routine healing, subsequent encounter 08/30/2016   MDD (major depressive disorder), recurrent episode, severe ([place]) 03/29/2016   Estrogen deficiency 12/15/2015   MS (multiple sclerosis) ([place]) 12/15/2015   Tremor 12/15/2015   Healthcare maintenance 12/15/2015   Essential hypertension    Protein-calorie malnutrition, severe 11/09/2015   Bipolar 1 disorder, depressed ([place]) 02/11/2015    ONSET DATE: Reports 2-3 months ago  REFERRING DIAG: R42 Vertigo  THERAPY DIAG:  Balance problem  Other abnormalities of gait and  mobility  Muscle weakness (generalized)  Dizziness and giddiness  Rationale for Evaluation and Treatment Rehabilitation  SUBJECTIVE:   SUBJECTIVE STATEMENT: Pt continues to deny dizziness.  States that she is having some back and neck pain this afternoon.  PERTINENT HISTORY: MS, Depression, Immunodeficiency, Sleep Apnea    PAIN:  Are you having pain? Yes: NPRS scale: 3-4/10 Pain location: cervical/headache and low back Pain description: "just hurts" Aggravating factors: exercise Relieving factors: pain medication  PRECAUTIONS: Fall  WEIGHT BEARING RESTRICTIONS No  FALLS: Has patient fallen in last 6 months? Yes. Number of falls 2 falls occurred during exercise (one with stability ball and one standing holding onto a chair)  LIVING ENVIRONMENT: Lives with: lives with their spouse Lives in: House/apartment Stairs: No Has following equipment at home: Single point cane, Environmental consultant - 4 wheeled, Wheelchair (manual), Electronics engineer, and Grab bars  PLOF: Independent  PATIENT GOALS:  "To stop this head from spinning."  OBJECTIVE:   DIAGNOSTIC FINDINGS: n/a  COGNITION: Overall cognitive status: Within functional limits for tasks assessed, but pt does state that she has some short term memory problems.   SENSATION: WFL  POSTURE: No Significant postural limitations   Cervical ROM:   WFL  STRENGTH:  BUE is WFL. BLE strength is grossly 4/5 throughout.  Some spasms/increased tone noted on LLE with doing MMT   GAIT: Gait pattern: decreased stride length and ataxic Distance walked: 100 ft Assistive device utilized: None Level of assistance: Modified independence Comments: Pt reports some increased pain with standing and walking for increased distances.  FUNCTIONAL TESTs:  Eval:  5 times sit to stand: 15.1 sec without UE use  PATIENT SURVEYS:  02/21/2022:  DHI 0% DHI Total Score: 0 / 100 Physical Score: 0 / 28 Emotional Score: 0 / 36 Functional Score: 0 /  36   VESTIBULAR ASSESSMENT   GENERAL OBSERVATION: Pt with ataxia noted during ambulation with some tremors noted.    SYMPTOM BEHAVIOR:   Subjective history: Pt reports dizziness since June, that is abnormal than the dizziness she typically experiences with her MS.   Non-Vestibular symptoms: headaches   Type of dizziness: Spinning/Vertigo   Frequency: daily   Duration: less than 5 minutes typically   Aggravating factors:  unknown   Relieving factors: head stationary and avoid busy/distracting environments   Progression of symptoms: worse   OCULOMOTOR EXAM:   Ocular Alignment: normal   Ocular ROM: No Limitations   Spontaneous Nystagmus: absent   Gaze-Induced Nystagmus: absent   Smooth Pursuits: intact   Saccades: intact   Convergence/Divergence: difficulty with convergence   VESTIBULAR - OCULAR REFLEX:    Positive head thrust test to either direction of head movement    POSITIONAL TESTING: Right Dix-Hallpike: nystagmus noted lasting approx 15 sec   OTHOSTATICS: not done   VESTIBULAR TREATMENT:  TODAY'S TREATMENT:  04/05/2022: Nustep level 6 x6 min with PT present to discuss progress/status Seated piriformis and hamstring stretch 1x30 sec bilat Seated  TA contraction by pushing purple ball into thighs 2x10 Single leg tap to 2 cones alt LE 2x10 Counter stretch at barre 2x20 sec Sit to/from stand from blue foam x10 reps Standing on blue foam performing head nods and head rotations x1 min each with seated recovery period in between Standing on blue foam performing ball toss x1 min Side stepping down PT hallway performing ball bounce onto wall 2x12' bilat each 4 way pelvic tilt seated on dynadisc x20 reps   03/31/2022: Nustep level 6 x6 min with PT present to discuss progress/status Seated piriformis and hamstring stretch 1x30 sec bilat Seated 3 way green pball rollout 5x10 sec each 4 way pelvic tilt seated on dynadisc x20 reps Seated with 4# ankle weights:  Heel/toe  raises, LAQ, marching, and hip abduction scissors.  BLE 2x10 each Tandem gait holding to barre, as needed 4x10 ft Single leg tap to 2 cones alt LE 2x10   03/29/2022: Nustep level 1 x6 min with PT present to discuss progress/status Seated piriformis and hamstring stretch 1x30 sec bilat Education about stretching and to not overdo the stretching, pt verbalizes understanding Seated 3 way green pball rollout 5x10 sec each Seated TA contraction by pushing purple ball into thighs 2x10 Manual Therapy:  Addaday to lumbar, glutes, hamstrings, IT band, calves.  BLE x10 minutes secondary to pain from workout earlier Standing on blue foam performing ball toss 2x20 Sit to/from stand from blue foam x10 reps Rows with green tband standing on blue foam 2x10 bilat Stirring the pot CW and CCW x10 each bilat with green tband standing on blue foam     PATIENT EDUCATION: Education details: Pt issued HEP Person educated: Patient Education method: Explanation, Demonstration, and Handouts Education comprehension: verbalized understanding and returned demonstration  HOME EXERCISE PROGRAM: Access Code: [email or web address] URL: [email or web address] Date: 02/21/2022 Prepared by: Shelby Dubin [place] Kosch  Exercises - Pencil Pushups  - 1 x daily - 7 x weekly - 2 sets - 10 reps - Seated Gaze Stabilization with Head Nod  - 1 x daily - 7 x weekly - 3 sets - 10 reps - Seated Gaze Stabilization with Head Rotation  - 1 x daily - 7 x weekly - 3 sets - 10 reps - Seated Long Arc Quad  - 1 x daily - 7 x weekly - 2 sets - 10 reps - Seated March  - 1 x daily - 7 x weekly - 2 sets - 10 reps - Sit to Stand Without Arm Support  - 1 x daily - 7 x weekly - 2 sets - 10 reps - Seated Hip Adduction Isometrics with Ball  - 1 x daily - 7 x weekly - 2 sets - 10 reps  GOALS: Goals reviewed with patient? Yes  SHORT TERM GOALS: Target date: 03/08/2022   Pt will be independent with initial HEP. Baseline: Goal status: MET  2.  Pt  will undergo Dizziness Handicapped Inventory assessment to assess balance. Baseline:  Goal status: MET   LONG TERM GOALS: Target date: 04/08/2022    Pt will be independent with advanced HEP. Baseline:  Goal status: IN PROGRESS  2.  Pt will have negative Hallpike-Dix consistently by last PT session. Baseline: positive on right Goal status: IN PROGRESS  3.  Pt will increase BLE strength to at least 4+ to 5-/5 grossly throughout to allow for improved independence with functional mobility. Baseline:  Goal status: IN PROGRESS  4.  Pt will report at least a 70% improvement in vestibular symptoms to decrease  risk of falling. Baseline:  Goal status: MET on 03/24/2022  5.  Pt will improve score on dizziness handicapped inventory by at least 15% to demonstrate decreased risk of falling. Baseline:  Goal status: MET on 02/21/2022    ASSESSMENT:  CLINICAL IMPRESSION: Ms Jarecki presents to skilled PT with continued reports of no dizziness with normal daily activities.  Pt able to progress with higher level balance and vestibular exercises during session today.  Pt with some dizziness and instability noted with side stepping with bouncing ball off wall and requires close SBA/CGA with seated recovery periods in between.  Pt continues to require skilled PT to progress towards goal related activities and improved balance.   OBJECTIVE IMPAIRMENTS decreased balance, decreased strength, decreased safety awareness, dizziness, and pain.   ACTIVITY LIMITATIONS standing, transfers, and locomotion level  PARTICIPATION LIMITATIONS: cleaning and community activity  PERSONAL FACTORS 1-2 comorbidities: MS, sleep apnea  are also affecting patient's functional outcome.   REHAB POTENTIAL: Good  CLINICAL DECISION MAKING: Evolving/moderate complexity  EVALUATION COMPLEXITY: Moderate   PLAN: PT FREQUENCY: 2x/week  PT DURATION: 8 weeks  PLANNED INTERVENTIONS: Therapeutic exercises, Therapeutic  activity, Neuromuscular re-education, Balance training, Gait training, Patient/Family education, Self Care, Joint mobilization, Stair training, Vestibular training, Canalith repositioning, Aquatic Therapy, Dry Needling, Electrical stimulation, Spinal manipulation, Spinal mobilization, Cryotherapy, Moist heat, Taping, Vasopneumatic device, Traction, Ultrasound, Ionotophoresis 39m/ml Dexamethasone, Manual therapy, and Re-evaluation  PLAN FOR NEXT SESSION: Assess and progress HEP as indicated, vestibular rehab as indicated, strengthening, balance   SJuel Burrow PT 04/05/2022, 3:34 PM   [facility] [place] [address]Phone # [phone] [phone]

## 2022-04-07 ENCOUNTER — Encounter: Payer: Self-pay | Admitting: Rehabilitative and Restorative Service Providers"

## 2022-04-07 ENCOUNTER — Ambulatory Visit: Payer: Medicare PPO | Admitting: Rehabilitative and Restorative Service Providers"

## 2022-04-07 DIAGNOSIS — R42 Dizziness and giddiness: Secondary | ICD-10-CM | POA: Diagnosis not present

## 2022-04-07 DIAGNOSIS — M6281 Muscle weakness (generalized): Secondary | ICD-10-CM

## 2022-04-07 DIAGNOSIS — R2689 Other abnormalities of gait and mobility: Secondary | ICD-10-CM

## 2022-04-07 NOTE — Therapy (Signed)
OUTPATIENT PHYSICAL THERAPY TREATMENT NOTE     Patient Name: Angel Maddox MRN: [id number] DOB:March 22, 1956, 66 y.o., female Today's Date: 04/07/2022  PCP: Monico Blitz, MD REFERRING PROVIDER: Ricarda Frame, FNP   PT End of Session - 04/07/22 1234     Visit Number 8    Date for PT Re-Evaluation 04/08/22    Authorization Type [employer]    Authorization Time Period 02/15/22 - 04/08/22    Authorization - Visit Number 8    Authorization - Number of Visits 10    PT Start Time 0630    PT Stop Time 1310    PT Time Calculation (min) 39 min    Activity Tolerance Patient tolerated treatment well    Behavior During Therapy [facility] for tasks assessed/performed             Past Medical History:  Diagnosis Date   Anxiety    Arthritis    osteo   Atypical mole 08/10/2021   Right Abdomen (side) upper (severe)   Bipolar 1 disorder ([place])    Cervicalgia    Chronic kidney disease    kidney stone   COPD (chronic obstructive pulmonary disease) ([place])    new diagnosis- now sees pulmonologist   Depression    Deviated septum    Elevated liver enzymes    She says "normal now"   GERD (gastroesophageal reflux disease)    H/O hiatal hernia    Headache    Hip fracture ([place]) 2017   left   History of kidney stones    4-5 yrs ago   Hypertension    Multiple sclerosis ([place])     Had for 15 years   Multiple sclerosis ([place])    dx 1999   Tremors of nervous system    Past Surgical History:  Procedure Laterality Date   ANTERIOR CERVICAL DECOMP/DISCECTOMY FUSION N/A 07/28/2017   Procedure: CERVICAL FOUR-FIVE, CERVICAL FIVE-SIX ANTERIOR CERVICAL DECOMPRESSION/DISCECTOMY FUSION;  Surgeon: Eustace Moore, MD;  Location: [facility];  Service: Neurosurgery;  Laterality: N/A;   COLONOSCOPY  2008   Dr.Kaplan   EYE SURGERY     [facility]   HIP PINNING,CANNULATED Left 11/09/2015   Procedure: CANNULATED HIP PINNING;  Surgeon: Rod Can, MD;  Location: [facility];  Service: Orthopedics;   Laterality: Left;   NASAL SEPTOPLASTY W/ TURBINOPLASTY Bilateral 01/12/2021   Procedure: NASAL SEPTOPLASTY WITH BILATERAL  TURBINATE REDUCTION;  Surgeon: Leta Baptist, MD;  Location: [facility];  Service: ENT;  Laterality: Bilateral;   TUBAL LIGATION     Patient Active Problem List   Diagnosis Date Noted   COVID-19 virus infection 03/20/2020   'light-for-dates' infant with signs of fetal malnutrition 05/06/2019   Seizure-like activity ([place]) 12/27/2018   Dyslipidemia 12/20/2018   Hyponatremia syndrome 07/04/2018   Dyspnea on exertion 07/03/2018   COPD mixed type ([place]) 07/03/2018   MDD (major depressive disorder) 06/28/2018   GAD (generalized anxiety disorder) 06/28/2018   Major depressive disorder, recurrent episode, moderate ([place]) 04/17/2018   Head injury 12/08/2017   Cervical vertebral fusion 07/28/2017   DDD (degenerative disc disease), cervical 07/10/2017   DDD (degenerative disc disease), thoracic 07/10/2017   DDD (degenerative disc disease), lumbar 07/10/2017   Facet arthritis of cervical region 07/10/2017   Abnormal liver function tests 03/07/2017   Hypokalemia 03/07/2017   Gastroesophageal reflux disease with esophagitis 03/07/2017   Esophageal dysphagia 03/07/2017   Chronic idiopathic constipation 02/01/2017   Fatigue 02/01/2017   HSV-1 infection 01/12/2017   Osteoporosis with  pathological fracture, with routine healing, subsequent encounter 08/30/2016   MDD (major depressive disorder), recurrent episode, severe ([place]) 03/29/2016   Estrogen deficiency 12/15/2015   MS (multiple sclerosis) ([place]) 12/15/2015   Tremor 12/15/2015   Healthcare maintenance 12/15/2015   Essential hypertension    Protein-calorie malnutrition, severe 11/09/2015   Bipolar 1 disorder, depressed ([place]) 02/11/2015    ONSET DATE: Reports 2-3 months ago  REFERRING DIAG: R42 Vertigo  THERAPY DIAG:  Balance problem  Other abnormalities of gait and mobility  Muscle weakness  (generalized)  Dizziness and giddiness  Rationale for Evaluation and Treatment Rehabilitation  SUBJECTIVE:   SUBJECTIVE STATEMENT: Pt continues to deny dizziness.  States that she is having some back and neck pain this afternoon.  PERTINENT HISTORY: MS, Depression, Immunodeficiency, Sleep Apnea    PAIN:  Are you having pain? Yes: NPRS scale: 3-4/10 Pain location: cervical/headache and low back Pain description: "just hurts" Aggravating factors: exercise Relieving factors: pain medication  PRECAUTIONS: Fall  WEIGHT BEARING RESTRICTIONS No  FALLS: Has patient fallen in last 6 months? Yes. Number of falls 2 falls occurred during exercise (one with stability ball and one standing holding onto a chair)  LIVING ENVIRONMENT: Lives with: lives with their spouse Lives in: House/apartment Stairs: No Has following equipment at home: Single point cane, Environmental consultant - 4 wheeled, Wheelchair (manual), Electronics engineer, and Grab bars  PLOF: Independent  PATIENT GOALS:  "To stop this head from spinning."  OBJECTIVE:   DIAGNOSTIC FINDINGS: n/a  COGNITION: Overall cognitive status: Within functional limits for tasks assessed, but pt does state that she has some short term memory problems.   SENSATION: WFL  POSTURE: No Significant postural limitations   Cervical ROM:   WFL  STRENGTH:  BUE is WFL. BLE strength is grossly 4/5 throughout.  Some spasms/increased tone noted on LLE with doing MMT BLE strength is grossly 4+ to 5/5 throughout with improved functional tasks noted   GAIT: Gait pattern: decreased stride length and ataxic Distance walked: 100 ft Assistive device utilized: None Level of assistance: Modified independence Comments: Pt reports some increased pain with standing and walking for increased distances.  FUNCTIONAL TESTs:  Eval:  5 times sit to stand: 15.1 sec without UE use 04/07/2022:  5 times sit to stand: 9.6 sec without UE use  PATIENT SURVEYS:  02/21/2022:   DHI 0% DHI Total Score: 0 / 100 Physical Score: 0 / 28 Emotional Score: 0 / 36 Functional Score: 0 / 36   VESTIBULAR ASSESSMENT   GENERAL OBSERVATION: Pt with ataxia noted during ambulation with some tremors noted.    SYMPTOM BEHAVIOR:   Subjective history: Pt reports dizziness since June, that is abnormal than the dizziness she typically experiences with her MS.   Non-Vestibular symptoms: headaches   Type of dizziness: Spinning/Vertigo   Frequency: daily   Duration: less than 5 minutes typically   Aggravating factors:  unknown   Relieving factors: head stationary and avoid busy/distracting environments   Progression of symptoms: worse   OCULOMOTOR EXAM:   Ocular Alignment: normal   Ocular ROM: No Limitations   Spontaneous Nystagmus: absent   Gaze-Induced Nystagmus: absent   Smooth Pursuits: intact   Saccades: intact   Convergence/Divergence: difficulty with convergence   VESTIBULAR - OCULAR REFLEX:    Positive head thrust test to either direction of head movement    POSITIONAL TESTING: Right Dix-Hallpike: nystagmus noted lasting approx 15 sec   OTHOSTATICS: not done   VESTIBULAR TREATMENT:  TODAY'S TREATMENT:  04/07/2022: Nustep level  6 x6 min with PT present to discuss progress/status Seated piriformis and hamstring stretch 1x30 sec bilat 5 times sit to/from stand [employer] negative bilat 4 way pelvic tilt seated on dynadisc x20 reps Side stepping down PT hallway performing ball bounce onto wall 2x12' bilat each Seated with 4# ankle weights:  Heel/toe raises, LAQ, marching, and hip abduction scissors.  BLE 2x10 each Sit to/from stand on purple foam holding 5# weight:  x10 with chest press, x10 with overhead press Ambulation down PT hallway performing head turns and head nods x1 lap each with SBA   04/05/2022: Nustep level 6 x6 min with PT present to discuss progress/status Seated piriformis and hamstring stretch 1x30 sec bilat Seated TA contraction by  pushing purple ball into thighs 2x10 Single leg tap to 2 cones alt LE 2x10 Counter stretch at barre 2x20 sec Sit to/from stand from blue foam x10 reps Standing on blue foam performing head nods and head rotations x1 min each with seated recovery period in between Standing on blue foam performing ball toss x1 min Side stepping down PT hallway performing ball bounce onto wall 2x12' bilat each 4 way pelvic tilt seated on dynadisc x20 reps   03/31/2022: Nustep level 6 x6 min with PT present to discuss progress/status Seated piriformis and hamstring stretch 1x30 sec bilat Seated 3 way green pball rollout 5x10 sec each 4 way pelvic tilt seated on dynadisc x20 reps Seated with 4# ankle weights:  Heel/toe raises, LAQ, marching, and hip abduction scissors.  BLE 2x10 each Tandem gait holding to barre, as needed 4x10 ft Single leg tap to 2 cones alt LE 2x10     PATIENT EDUCATION: Education details: Pt issued HEP Person educated: Patient Education method: Explanation, Demonstration, and Handouts Education comprehension: verbalized understanding and returned demonstration  HOME EXERCISE PROGRAM: Access Code: [email or web address] URL: [email or web address] Date: 02/21/2022 Prepared by: Shelby Dubin Crispin Vogel  Exercises - Pencil Pushups  - 1 x daily - 7 x weekly - 2 sets - 10 reps - Seated Gaze Stabilization with Head Nod  - 1 x daily - 7 x weekly - 3 sets - 10 reps - Seated Gaze Stabilization with Head Rotation  - 1 x daily - 7 x weekly - 3 sets - 10 reps - Seated Long Arc Quad  - 1 x daily - 7 x weekly - 2 sets - 10 reps - Seated March  - 1 x daily - 7 x weekly - 2 sets - 10 reps - Sit to Stand Without Arm Support  - 1 x daily - 7 x weekly - 2 sets - 10 reps - Seated Hip Adduction Isometrics with Ball  - 1 x daily - 7 x weekly - 2 sets - 10 reps  GOALS: Goals reviewed with patient? Yes  SHORT TERM GOALS: Target date: 03/08/2022   Pt will be independent with initial HEP. Baseline: Goal  status: MET  2.  Pt will undergo Dizziness Handicapped Inventory assessment to assess balance. Baseline:  Goal status: MET   LONG TERM GOALS: Target date: 04/08/2022    Pt will be independent with advanced HEP. Baseline:  Goal status: MET on  04/07/22  2.  Pt will have negative Hallpike-Dix consistently by last PT session. Baseline: positive on right Goal status: MET on 04/07/22  3.  Pt will increase BLE strength to at least 4+ to 5-/5 grossly throughout to allow for improved independence with functional mobility. Baseline:  Goal status: MET on 04/07/22  4.  Pt will report  at least a 70% improvement in vestibular symptoms to decrease risk of falling. Baseline:  Goal status: MET on 03/24/2022  5.  Pt will improve score on dizziness handicapped inventory by at least 15% to demonstrate decreased risk of falling. Baseline:  Goal status: MET on 02/21/2022    ASSESSMENT:  CLINICAL IMPRESSION: Ms Lenger presents to skilled PT with continued reports of no dizziness.  Pt has been able to successfully participate in gym exercise classes.  Pt states that she has not had any new falls and that she is being more careful.  Pt denies any dizziness with supine and rolling over in bed.  Performed Marye Round and it was negative on bilateral sides.  Pt has improved on strength and able to progress strengthening exercises.  Pt has met all PT goals and is ready for discharge at this time to continue with HEP and exercising at the gym.   OBJECTIVE IMPAIRMENTS decreased balance, decreased strength, decreased safety awareness, dizziness, and pain.   ACTIVITY LIMITATIONS standing, transfers, and locomotion level  PARTICIPATION LIMITATIONS: cleaning and community activity  PERSONAL FACTORS 1-2 comorbidities: MS, sleep apnea  are also affecting patient's functional outcome.   REHAB POTENTIAL: Good  CLINICAL DECISION MAKING: Evolving/moderate complexity  EVALUATION COMPLEXITY:  Moderate   PLAN: PT FREQUENCY: 2x/week  PT DURATION: 8 weeks  PLANNED INTERVENTIONS: Therapeutic exercises, Therapeutic activity, Neuromuscular re-education, Balance training, Gait training, Patient/Family education, Self Care, Joint mobilization, Stair training, Vestibular training, Canalith repositioning, Aquatic Therapy, Dry Needling, Electrical stimulation, Spinal manipulation, Spinal mobilization, Cryotherapy, Moist heat, Taping, Vasopneumatic device, Traction, Ultrasound, Ionotophoresis 28m/ml Dexamethasone, Manual therapy, and Re-evaluation  PLAN FOR NEXT SESSION: Assess and progress HEP as indicated, vestibular rehab as indicated, strengthening, balance    PHYSICAL THERAPY DISCHARGE SUMMARY   Patient agrees to discharge. Patient goals were met. Patient is being discharged due to meeting the stated rehab goals.     SShelby DubinMenke, PT 04/07/2022, 1:20 PM   [facility] [place] [address]Phone # [phone] [phone]

## 2022-05-06 ENCOUNTER — Other Ambulatory Visit: Payer: Self-pay

## 2022-05-06 ENCOUNTER — Telehealth: Payer: Self-pay | Admitting: Gastroenterology

## 2022-05-06 DIAGNOSIS — R1013 Epigastric pain: Secondary | ICD-10-CM

## 2022-05-06 MED ORDER — OMEPRAZOLE 40 MG PO CPDR: 40.0000 mg | DELAYED_RELEASE_CAPSULE | Freq: Every day | ORAL | 2 refills | Status: DC

## 2022-05-06 NOTE — Telephone Encounter (Signed)
Inbound call from patient wanting to have the omeprazole dose changed back to 40 mg. Patient states medication is not helping at the dose she is currently taking. Please advise.  Thank you

## 2022-05-06 NOTE — Telephone Encounter (Signed)
Increased omeprazole back to 40 mg once daily - script sent to pharmacy

## 2022-05-06 NOTE — Progress Notes (Signed)
Increased omeprazole back to 40 mg once daily

## 2022-05-18 ENCOUNTER — Other Ambulatory Visit (HOSPITAL_COMMUNITY): Payer: Self-pay | Admitting: *Deleted

## 2022-05-20 ENCOUNTER — Other Ambulatory Visit: Payer: Self-pay | Admitting: Gastroenterology

## 2022-05-22 ENCOUNTER — Other Ambulatory Visit: Payer: Self-pay | Admitting: Physician Assistant

## 2022-05-23 ENCOUNTER — Ambulatory Visit (HOSPITAL_COMMUNITY)
Admission: RE | Admit: 2022-05-23 | Discharge: 2022-05-23 | Disposition: A | Discharge status: Home / Self Care | Payer: Medicare PPO | Source: Ambulatory Visit | Attending: Nurse Practitioner | Admitting: Nurse Practitioner

## 2022-05-23 DIAGNOSIS — G35 Multiple sclerosis: Secondary | ICD-10-CM | POA: Diagnosis not present

## 2022-05-23 MED ORDER — DIPHENHYDRAMINE HCL 50 MG/ML IJ SOLN
INTRAMUSCULAR | Status: AC
  Administered 2022-05-23: 25 mg via INTRAVENOUS
  Filled 2022-05-23: qty 1

## 2022-05-23 MED ORDER — SODIUM CHLORIDE 0.9 % IV SOLN
1000.0000 mg | INTRAVENOUS | Status: DC
  Administered 2022-05-23: 1000 mg via INTRAVENOUS
  Filled 2022-05-23: qty 100

## 2022-05-23 MED ORDER — DIPHENHYDRAMINE HCL 50 MG/ML IJ SOLN: 25.0000 mg | INTRAMUSCULAR | Status: DC

## 2022-05-23 MED ORDER — METHYLPREDNISOLONE SODIUM SUCC 125 MG IJ SOLR
INTRAMUSCULAR | Status: AC
  Administered 2022-05-23: 125 mg via INTRAVENOUS
  Filled 2022-05-23: qty 2

## 2022-05-23 MED ORDER — ACETAMINOPHEN 500 MG PO TABS: 1000.0000 mg | ORAL_TABLET | ORAL | Status: DC

## 2022-05-23 MED ORDER — METHYLPREDNISOLONE SODIUM SUCC 125 MG IJ SOLR: 125.0000 mg | INTRAMUSCULAR | Status: DC

## 2022-05-23 MED ORDER — ACETAMINOPHEN 500 MG PO TABS
ORAL_TABLET | ORAL | Status: AC
  Administered 2022-05-23: 1000 mg via ORAL
  Filled 2022-05-23: qty 2

## 2022-05-25 DIAGNOSIS — R42 Dizziness and giddiness: Secondary | ICD-10-CM | POA: Diagnosis not present

## 2022-05-25 DIAGNOSIS — R2689 Other abnormalities of gait and mobility: Secondary | ICD-10-CM | POA: Diagnosis not present

## 2022-05-25 DIAGNOSIS — R400 Somnolence: Secondary | ICD-10-CM | POA: Diagnosis not present

## 2022-05-25 DIAGNOSIS — D72819 Decreased white blood cell count, unspecified: Secondary | ICD-10-CM | POA: Diagnosis not present

## 2022-05-25 DIAGNOSIS — G35 Multiple sclerosis: Secondary | ICD-10-CM | POA: Diagnosis not present

## 2022-05-25 DIAGNOSIS — D849 Immunodeficiency, unspecified: Secondary | ICD-10-CM | POA: Diagnosis not present

## 2022-05-30 ENCOUNTER — Other Ambulatory Visit: Payer: Self-pay | Admitting: Nurse Practitioner

## 2022-05-30 DIAGNOSIS — G35 Multiple sclerosis: Secondary | ICD-10-CM

## 2022-06-07 ENCOUNTER — Ambulatory Visit (HOSPITAL_COMMUNITY)
Admission: RE | Admit: 2022-06-07 | Discharge: 2022-06-07 | Disposition: A | Discharge status: Home / Self Care | Payer: Medicare PPO | Source: Ambulatory Visit | Attending: Nurse Practitioner | Admitting: Nurse Practitioner

## 2022-06-07 DIAGNOSIS — G35 Multiple sclerosis: Secondary | ICD-10-CM | POA: Insufficient documentation

## 2022-06-07 MED ORDER — SODIUM CHLORIDE 0.9 % IV SOLN
1000.0000 mg | INTRAVENOUS | Status: AC
  Administered 2022-06-07: 1000 mg via INTRAVENOUS
  Filled 2022-06-07: qty 100

## 2022-06-07 MED ORDER — ACETAMINOPHEN 500 MG PO TABS
ORAL_TABLET | ORAL | Status: AC
  Administered 2022-06-07: 1000 mg via ORAL
  Filled 2022-06-07: qty 2

## 2022-06-07 MED ORDER — ACETAMINOPHEN 500 MG PO TABS: 1000.0000 mg | ORAL_TABLET | ORAL | Status: AC

## 2022-06-07 MED ORDER — DIPHENHYDRAMINE HCL 50 MG/ML IJ SOLN
INTRAMUSCULAR | Status: AC
  Administered 2022-06-07: 25 mg via INTRAVENOUS
  Filled 2022-06-07: qty 1

## 2022-06-07 MED ORDER — DIPHENHYDRAMINE HCL 50 MG/ML IJ SOLN: 25.0000 mg | INTRAMUSCULAR | Status: AC

## 2022-06-07 MED ORDER — METHYLPREDNISOLONE SODIUM SUCC 125 MG IJ SOLR
INTRAMUSCULAR | Status: AC
  Administered 2022-06-07: 125 mg via INTRAVENOUS
  Filled 2022-06-07: qty 2

## 2022-06-07 MED ORDER — METHYLPREDNISOLONE SODIUM SUCC 125 MG IJ SOLR: 125.0000 mg | INTRAMUSCULAR | Status: AC

## 2022-06-16 DIAGNOSIS — R5383 Other fatigue: Secondary | ICD-10-CM | POA: Diagnosis not present

## 2022-06-16 DIAGNOSIS — Z7189 Other specified counseling: Secondary | ICD-10-CM | POA: Diagnosis not present

## 2022-06-16 DIAGNOSIS — Z1331 Encounter for screening for depression: Secondary | ICD-10-CM | POA: Diagnosis not present

## 2022-06-16 DIAGNOSIS — E7800 Pure hypercholesterolemia, unspecified: Secondary | ICD-10-CM | POA: Diagnosis not present

## 2022-06-16 DIAGNOSIS — Z0000 Encounter for general adult medical examination without abnormal findings: Secondary | ICD-10-CM | POA: Diagnosis not present

## 2022-06-16 DIAGNOSIS — Z79899 Other long term (current) drug therapy: Secondary | ICD-10-CM | POA: Diagnosis not present

## 2022-06-16 DIAGNOSIS — Z681 Body mass index (BMI) 19 or less, adult: Secondary | ICD-10-CM | POA: Diagnosis not present

## 2022-06-16 DIAGNOSIS — I10 Essential (primary) hypertension: Secondary | ICD-10-CM | POA: Diagnosis not present

## 2022-06-16 DIAGNOSIS — Z299 Encounter for prophylactic measures, unspecified: Secondary | ICD-10-CM | POA: Diagnosis not present

## 2022-06-18 ENCOUNTER — Ambulatory Visit
Admission: RE | Admit: 2022-06-18 | Discharge: 2022-06-18 | Disposition: A | Discharge status: Home / Self Care | Payer: Medicare PPO | Source: Ambulatory Visit | Attending: Nurse Practitioner | Admitting: Nurse Practitioner

## 2022-06-18 DIAGNOSIS — G35 Multiple sclerosis: Secondary | ICD-10-CM

## 2022-06-18 DIAGNOSIS — M5023 Other cervical disc displacement, cervicothoracic region: Secondary | ICD-10-CM | POA: Diagnosis not present

## 2022-06-18 MED ORDER — GADOPICLENOL 0.5 MMOL/ML IV SOLN
6.0000 mL | Freq: Once | INTRAVENOUS | Status: AC | PRN
  Administered 2022-06-18: 6 mL via INTRAVENOUS

## 2022-06-21 DIAGNOSIS — Z79899 Other long term (current) drug therapy: Secondary | ICD-10-CM | POA: Diagnosis not present

## 2022-06-21 DIAGNOSIS — E7800 Pure hypercholesterolemia, unspecified: Secondary | ICD-10-CM | POA: Diagnosis not present

## 2022-06-21 DIAGNOSIS — E559 Vitamin D deficiency, unspecified: Secondary | ICD-10-CM | POA: Diagnosis not present

## 2022-06-21 DIAGNOSIS — R5383 Other fatigue: Secondary | ICD-10-CM | POA: Diagnosis not present

## 2022-06-23 ENCOUNTER — Encounter: Payer: Self-pay | Admitting: Physician Assistant

## 2022-06-23 ENCOUNTER — Ambulatory Visit (INDEPENDENT_AMBULATORY_CARE_PROVIDER_SITE_OTHER): Payer: Medicare PPO | Admitting: Physician Assistant

## 2022-06-23 ENCOUNTER — Other Ambulatory Visit: Payer: Self-pay | Admitting: Physician Assistant

## 2022-06-23 DIAGNOSIS — G4700 Insomnia, unspecified: Secondary | ICD-10-CM

## 2022-06-23 DIAGNOSIS — F411 Generalized anxiety disorder: Secondary | ICD-10-CM

## 2022-06-23 DIAGNOSIS — G35 Multiple sclerosis: Secondary | ICD-10-CM | POA: Diagnosis not present

## 2022-06-23 DIAGNOSIS — F331 Major depressive disorder, recurrent, moderate: Secondary | ICD-10-CM

## 2022-06-23 MED ORDER — BUPROPION HCL ER (SR) 200 MG PO TB12: 200.0000 mg | ORAL_TABLET | Freq: Two times a day (BID) | ORAL | 5 refills | Status: DC

## 2022-06-23 MED ORDER — CITALOPRAM HYDROBROMIDE 40 MG PO TABS: 40.0000 mg | ORAL_TABLET | Freq: Every day | ORAL | 5 refills | Status: DC

## 2022-06-23 NOTE — Progress Notes (Signed)
[facility] Med Check  Patient ID: Angel Maddox,  MRN: [id number]  PCP: Monico Blitz, MD  Date of Evaluation: 06/23/2022 Time spent:20 minutes  HISTORY/CURRENT STATUS: For routine 6 month med check.    Has had an MS flare a few months ago. That has gotten her down but is better now. Had 2 treatments of  Rituxan recently. She's lost muscle mass, has lost weight, is seeing a dietician and back in the gym, trying to help with that. She's able to walk without assistance, ADLs are limited b/c MS flare but getting better. Personal hygiene is nl.   No extreme sadness, tearfulness, or feelings of hopelessness.  Hadn't been able to sleep well lately so restarted Mirtazapine, takes it prn. It's helped.  Denies any changes in concentration, making decisions, or remembering things. Does get anxious at times, takes Klonopin prn. It's helpful.  Denies suicidal or homicidal thoughts.  Patient denies increased energy with decreased need for sleep, increased talkativeness, racing thoughts, impulsivity or risky behaviors, increased spending, increased libido, grandiosity, increased irritability or anger, paranoia, or hallucinations.  Denies dizziness, syncope, seizures, numbness, tingling, tremor, tics, slurred speech, confusion.  Does have muscle pain and stiffness at times, due to the MS.  Individual Medical History/ Review of Systems: Changes? :Yes    see HPI   Past medications for mental health diagnoses include: Depakote, Lamictal, Risperdal, Celexa, Klonopin, trazodone, Wellbutrin, Vistaril, primidone, gabapentin, Prozac, Zyprexa, Ambien, Lunesta, Effexor  Allergies: Hydrocodone  Current Medications:  Current Outpatient Medications:    amLODipine (NORVASC) 10 MG tablet, Take 1 tablet (10 mg total) by mouth daily., Disp: 90 tablet, Rfl: 1   baclofen (LIORESAL) 10 MG tablet, Take 10 mg by mouth 3 (three) times daily., Disp: , Rfl:    clonazePAM (KLONOPIN) 1 MG disintegrating tablet, 1 mg at  bedtime., Disp: , Rfl:    dalfampridine 10 MG TB12, 2 (two) times daily., Disp: , Rfl:    dicyclomine (BENTYL) 10 MG capsule, TAKE 1 CAPSULE (10 MG TOTAL) BY MOUTH EVERY 8 (EIGHT) HOURS AS NEEDED FOR SPASMS., Disp: 30 capsule, Rfl: 1   gabapentin (NEURONTIN) 300 MG capsule, Take 2 capsules (600 mg total) by mouth 3 (three) times daily., Disp: 180 capsule, Rfl: 0   losartan (COZAAR) 25 MG tablet, TAKE 1 TABLET BY MOUTH EVERY DAY, Disp: 90 tablet, Rfl: 1   mirtazapine (REMERON) 15 MG tablet, TAKE 0.5-1 TABLETS (7.5-15 MG TOTAL) BY MOUTH AT BEDTIME AS NEEDED., Disp: 90 tablet, Rfl: 3   mupirocin ointment (BACTROBAN) 2 %, Apply 1 application topically 2 (two) times daily., Disp: 60 g, Rfl: 0   omeprazole (PRILOSEC) 40 MG capsule, Take 1 capsule (40 mg total) by mouth daily., Disp: 30 capsule, Rfl: 2   primidone (MYSOLINE) 250 MG tablet, Take 250 mg by mouth 3 (three) times daily., Disp: , Rfl:    psyllium (METAMUCIL) 58.6 % powder, Take 1 packet by mouth daily., Disp: , Rfl:    riTUXimab (RITUXAN) 100 MG/10ML injection, Inject into the vein., Disp: , Rfl:    tiZANidine (ZANAFLEX) 4 MG capsule, , Disp: , Rfl:    buPROPion (WELLBUTRIN SR) 200 MG 12 hr tablet, Take 1 tablet (200 mg total) by mouth 2 (two) times daily., Disp: 60 tablet, Rfl: 5   citalopram (CELEXA) 40 MG tablet, Take 1 tablet (40 mg total) by mouth daily., Disp: 30 tablet, Rfl: 5   ondansetron (ZOFRAN-ODT) 4 MG disintegrating tablet, TAKE 1 TABLET BY MOUTH EVERY 8 HOURS AS NEEDED FOR NAUSEA AND  VOMITING (Patient not taking: Reported on 02/15/2022), Disp: 30 tablet, Rfl: 2   Oxycodone HCl 10 MG TABS, Take 10 mg by mouth 3 (three) times daily as needed., Disp: , Rfl:  Medication Side Effects: none  Family Medical/ Social History: Changes? No   MENTAL HEALTH EXAM:  There were no vitals taken for this visit.There is no height or weight on file to calculate BMI.  General Appearance: Casual, Neat and Well Groomed  Eye Contact:  Good   Speech:  Clear and Coherent and Normal Rate  Volume:  Normal  Mood:  Euthymic  Affect:  Appropriate  Thought Process:  Goal Directed and Descriptions of Associations: Circumstantial  Orientation:  Full (Time, Place, and Person)  Thought Content: Logical   Suicidal Thoughts:  No  Homicidal Thoughts:  No  Memory:  WNL  Judgement:  Good  Insight:  Good  Psychomotor Activity:   Walks slowly, without assistant  Concentration:  Concentration: Good  Recall:  Good  Fund of Knowledge: Good  Language: Good  Assets:  Desire for Improvement Financial Resources/Insurance Housing Transportation  ADLs nl  Cognition: WNL  Prognosis:  Good   DIAGNOSES:    ICD-10-CM   1. Major depressive disorder, recurrent episode, moderate (HCC)  F33.1     2. Generalized anxiety disorder  F41.1     3. Insomnia, unspecified type  G47.00     4. MS (multiple sclerosis) ([place])  G35       Receiving Psychotherapy: No  [name], LCSW in the past.  RECOMMENDATIONS:  PDMP was reviewed.  Klonopin filled 05/16/2022. Oxycodone and Gabapentin known to me.  I provided 20 minutes of face to face time during this encounter, including time spent before and after the visit in records review, medical decision making, counseling pertinent to today's visit, and charting.   We briefly discussed options of adding Abilify, Rexulti or another mood stabilizer to help with depression.  She does not want to take another medication and feels like her mood is situational, she will let me know if things get worse at any point.  For now she wants to continue every 47-monthvisits which is fine.  Per patient request and due to history of overdose, only give a 30-day supply, vs 90 days, at a time when refills are due. Continue Wellbutrin SR 200 mg, 1 p.o. twice daily. Continue Celexa 40 mg, 1 p.o. daily. Continue Klonopin 1 mg as directed per neurology. (I'll Rx when needed.) Continue Gabapentin 300 mg, 2 po tid per another  provider. Return in 6 months.  TDonnal Moat PA-C

## 2022-07-01 ENCOUNTER — Telehealth: Payer: Self-pay | Admitting: Physician Assistant

## 2022-07-01 NOTE — Telephone Encounter (Signed)
Pt informed

## 2022-07-01 NOTE — Telephone Encounter (Signed)
Pt stated she goes to bed at 10 pm and wakes up at 4 am.It's hard for her to go back to bed and she is taking 1 15 mg tablet at bedtime.Can she increase to 2 or possibly change med?

## 2022-07-01 NOTE — Telephone Encounter (Signed)
LVM to rtc 

## 2022-07-01 NOTE — Telephone Encounter (Signed)
It usually doesn't help to increase the dose, have her not take the 2nd dose of Wellbutrin later than 3 PM, no caffeine after lunch, and no electronics 2 hours before she needs to go to sleep.

## 2022-07-01 NOTE — Telephone Encounter (Signed)
Pt called at 11:22a.  She is asking for a different sleep medicine or a stronger dose of the Mirtazapine.  She said she only getting 7 hours of sleep, "which is not helping her".  Next appt 6/27

## 2022-07-13 DIAGNOSIS — J343 Hypertrophy of nasal turbinates: Secondary | ICD-10-CM | POA: Diagnosis not present

## 2022-07-13 DIAGNOSIS — J0101 Acute recurrent maxillary sinusitis: Secondary | ICD-10-CM | POA: Diagnosis not present

## 2022-07-30 ENCOUNTER — Other Ambulatory Visit: Payer: Self-pay | Admitting: Physician Assistant

## 2022-08-01 ENCOUNTER — Other Ambulatory Visit: Payer: Self-pay | Admitting: Gastroenterology

## 2022-08-01 DIAGNOSIS — Z0000 Encounter for general adult medical examination without abnormal findings: Secondary | ICD-10-CM | POA: Diagnosis not present

## 2022-08-01 DIAGNOSIS — R1013 Epigastric pain: Secondary | ICD-10-CM

## 2022-08-01 DIAGNOSIS — F339 Major depressive disorder, recurrent, unspecified: Secondary | ICD-10-CM | POA: Diagnosis not present

## 2022-08-01 DIAGNOSIS — R0981 Nasal congestion: Secondary | ICD-10-CM | POA: Diagnosis not present

## 2022-08-01 DIAGNOSIS — Z299 Encounter for prophylactic measures, unspecified: Secondary | ICD-10-CM | POA: Diagnosis not present

## 2022-08-01 DIAGNOSIS — G35 Multiple sclerosis: Secondary | ICD-10-CM | POA: Diagnosis not present

## 2022-08-01 DIAGNOSIS — Z681 Body mass index (BMI) 19 or less, adult: Secondary | ICD-10-CM | POA: Diagnosis not present

## 2022-08-15 ENCOUNTER — Telehealth: Payer: Self-pay | Admitting: Gastroenterology

## 2022-08-15 NOTE — Telephone Encounter (Signed)
PT is experiencing trapped gas. Metamucil  is not working. She also tried Event organiser. Wants to know can should she try gas-x or can she be prescribed something. Please advise.

## 2022-08-15 NOTE — Telephone Encounter (Signed)
Returned call to patient. I informed patient that we do not prescribe anything for trapped gas and typically recommend Gas-X OTC - take as directed on box. I also informed patient that is she is not passing her bowels on a regular basis that could cause gas as well, I informed patient that she can take up to 3 doses of Miralax daily PRN. I advised patient to call back if she has any additional questions.

## 2022-08-18 DIAGNOSIS — M2042 Other hammer toe(s) (acquired), left foot: Secondary | ICD-10-CM | POA: Diagnosis not present

## 2022-08-18 DIAGNOSIS — M79675 Pain in left toe(s): Secondary | ICD-10-CM | POA: Diagnosis not present

## 2022-08-22 DIAGNOSIS — M2040 Other hammer toe(s) (acquired), unspecified foot: Secondary | ICD-10-CM | POA: Diagnosis not present

## 2022-08-22 DIAGNOSIS — G35 Multiple sclerosis: Secondary | ICD-10-CM | POA: Diagnosis not present

## 2022-08-22 DIAGNOSIS — Z87891 Personal history of nicotine dependence: Secondary | ICD-10-CM | POA: Diagnosis not present

## 2022-08-22 DIAGNOSIS — I10 Essential (primary) hypertension: Secondary | ICD-10-CM | POA: Diagnosis not present

## 2022-08-22 DIAGNOSIS — Z6820 Body mass index (BMI) 20.0-20.9, adult: Secondary | ICD-10-CM | POA: Diagnosis not present

## 2022-08-22 DIAGNOSIS — R0981 Nasal congestion: Secondary | ICD-10-CM | POA: Diagnosis not present

## 2022-08-22 DIAGNOSIS — Z299 Encounter for prophylactic measures, unspecified: Secondary | ICD-10-CM | POA: Diagnosis not present

## 2022-08-24 ENCOUNTER — Other Ambulatory Visit: Payer: Self-pay | Admitting: Otolaryngology

## 2022-08-24 DIAGNOSIS — J329 Chronic sinusitis, unspecified: Secondary | ICD-10-CM

## 2022-08-24 DIAGNOSIS — J343 Hypertrophy of nasal turbinates: Secondary | ICD-10-CM | POA: Diagnosis not present

## 2022-08-24 DIAGNOSIS — J0101 Acute recurrent maxillary sinusitis: Secondary | ICD-10-CM | POA: Diagnosis not present

## 2022-09-05 DIAGNOSIS — E2839 Other primary ovarian failure: Secondary | ICD-10-CM | POA: Diagnosis not present

## 2022-09-07 DIAGNOSIS — G35 Multiple sclerosis: Secondary | ICD-10-CM | POA: Diagnosis not present

## 2022-09-07 DIAGNOSIS — F339 Major depressive disorder, recurrent, unspecified: Secondary | ICD-10-CM | POA: Diagnosis not present

## 2022-09-07 DIAGNOSIS — D72819 Decreased white blood cell count, unspecified: Secondary | ICD-10-CM | POA: Diagnosis not present

## 2022-09-07 DIAGNOSIS — R42 Dizziness and giddiness: Secondary | ICD-10-CM | POA: Diagnosis not present

## 2022-09-07 DIAGNOSIS — D849 Immunodeficiency, unspecified: Secondary | ICD-10-CM | POA: Diagnosis not present

## 2022-09-16 DIAGNOSIS — M21372 Foot drop, left foot: Secondary | ICD-10-CM | POA: Diagnosis not present

## 2022-09-16 DIAGNOSIS — M205X2 Other deformities of toe(s) (acquired), left foot: Secondary | ICD-10-CM | POA: Diagnosis not present

## 2022-09-16 DIAGNOSIS — M25572 Pain in left ankle and joints of left foot: Secondary | ICD-10-CM | POA: Diagnosis not present

## 2022-09-21 ENCOUNTER — Ambulatory Visit
Admission: RE | Admit: 2022-09-21 | Discharge: 2022-09-21 | Disposition: A | Discharge status: Home / Self Care | Payer: Medicare PPO | Source: Ambulatory Visit | Attending: Otolaryngology | Admitting: Otolaryngology

## 2022-09-21 DIAGNOSIS — J329 Chronic sinusitis, unspecified: Secondary | ICD-10-CM | POA: Diagnosis not present

## 2022-09-21 DIAGNOSIS — Z9889 Other specified postprocedural states: Secondary | ICD-10-CM | POA: Diagnosis not present

## 2022-09-27 ENCOUNTER — Other Ambulatory Visit: Payer: Self-pay

## 2022-09-27 ENCOUNTER — Ambulatory Visit: Payer: Medicare PPO | Attending: Orthopedic Surgery

## 2022-09-27 DIAGNOSIS — R2689 Other abnormalities of gait and mobility: Secondary | ICD-10-CM | POA: Insufficient documentation

## 2022-09-27 DIAGNOSIS — R2681 Unsteadiness on feet: Secondary | ICD-10-CM | POA: Diagnosis not present

## 2022-09-27 NOTE — Therapy (Signed)
OUTPATIENT PHYSICAL THERAPY NEURO EVALUATION   Patient Name: Angel Maddox MRN: [id number] DOB:25-Nov-1955, 67 y.o., female Today's Date: 09/27/2022   PCP: Monico Blitz, MD REFERRING PROVIDER: Wylene Simmer, MD   END OF SESSION:  PT End of Session - 09/27/22 1308     Visit Number 1    Number of Visits 6    Date for PT Re-Evaluation 10/28/22    PT Start Time 1301    PT Stop Time 1345    PT Time Calculation (min) 44 min    Activity Tolerance Patient tolerated treatment well    Behavior During Therapy [facility] for tasks assessed/performed             Past Medical History:  Diagnosis Date   Anxiety    Arthritis    osteo   Atypical mole 08/10/2021   Right Abdomen (side) upper (severe)   Bipolar 1 disorder    Cervicalgia    Chronic kidney disease    kidney stone   COPD (chronic obstructive pulmonary disease)    new diagnosis- now sees pulmonologist   Depression    Deviated septum    Elevated liver enzymes    She says "normal now"   GERD (gastroesophageal reflux disease)    H/O hiatal hernia    Headache    Hip fracture 2017   left   History of kidney stones    4-5 yrs ago   Hypertension    Multiple sclerosis     Had for 15 years   Multiple sclerosis    dx 1999   Tremors of nervous system    Past Surgical History:  Procedure Laterality Date   ANTERIOR CERVICAL DECOMP/DISCECTOMY FUSION N/A 07/28/2017   Procedure: CERVICAL FOUR-FIVE, CERVICAL FIVE-SIX ANTERIOR CERVICAL DECOMPRESSION/DISCECTOMY FUSION;  Surgeon: Eustace Moore, MD;  Location: [place];  Service: Neurosurgery;  Laterality: N/A;   COLONOSCOPY  2008   Dr.Kaplan   EYE SURGERY     [facility]   HIP PINNING,CANNULATED Left 11/09/2015   Procedure: CANNULATED HIP PINNING;  Surgeon: Rod Can, MD;  Location: [facility];  Service: Orthopedics;  Laterality: Left;   NASAL SEPTOPLASTY W/ TURBINOPLASTY Bilateral 01/12/2021   Procedure: NASAL SEPTOPLASTY WITH BILATERAL  TURBINATE REDUCTION;   Surgeon: Leta Baptist, MD;  Location: [place];  Service: ENT;  Laterality: Bilateral;   TUBAL LIGATION     Patient Active Problem List   Diagnosis Date Noted   COVID-19 virus infection 03/20/2020   'Light-for-dates' infant with signs of fetal malnutrition 05/06/2019   Seizure-like activity 12/27/2018   Dyslipidemia 12/20/2018   Hyponatremia syndrome 07/04/2018   Dyspnea on exertion 07/03/2018   COPD mixed type 07/03/2018   MDD (major depressive disorder) 06/28/2018   GAD (generalized anxiety disorder) 06/28/2018   Major depressive disorder, recurrent episode, moderate 04/17/2018   Head injury 12/08/2017   Cervical vertebral fusion 07/28/2017   DDD (degenerative disc disease), cervical 07/10/2017   DDD (degenerative disc disease), thoracic 07/10/2017   DDD (degenerative disc disease), lumbar 07/10/2017   Facet arthritis of cervical region 07/10/2017   Abnormal liver function tests 03/07/2017   Hypokalemia 03/07/2017   Gastroesophageal reflux disease with esophagitis 03/07/2017   Esophageal dysphagia 03/07/2017   Chronic idiopathic constipation 02/01/2017   Fatigue 02/01/2017   HSV-1 infection 01/12/2017   Osteoporosis with pathological fracture, with routine healing, subsequent encounter 08/30/2016   MDD (major depressive disorder), recurrent episode, severe 03/29/2016   Estrogen deficiency 12/15/2015   MS (multiple sclerosis) 12/15/2015  Tremor 12/15/2015   Healthcare maintenance 12/15/2015   Essential hypertension    Protein-calorie malnutrition, severe 11/09/2015   Bipolar 1 disorder, depressed 02/11/2015    ONSET DATE: 2-3 years ago  REFERRING DIAG: Foot drop, left foot   THERAPY DIAG:  Other abnormalities of gait and mobility  Unsteadiness on feet  Rationale for Evaluation and Treatment: Rehabilitation  SUBJECTIVE:                                                                                                                                                                                              SUBJECTIVE STATEMENT: Patient reports that in the past 6 months her left leg has begun getting really weak. However, she has had problems with her gait for the past 2-3 years. She notes that she got really dizzy while she was at church on Sunday and her husband had to help her. This caused her to be "down" all of Sunday and yesterday. She has been trying to exercise at least 3 times per week to keep her strength up. However, she notes that she worked out earlier today which caused her to be a little worse this afternoon. She is scheduled to get another brace for her left foot and ankle to help her when she has to walk a lot.  Pt accompanied by: self  PERTINENT HISTORY: Hypertension, COPD, multiple sclerosis, osteoporosis, chronic back pain, bipolar, depression, anxiety, and arthritis  PAIN:  Are you having pain? Yes: NPRS scale: 8/10 Pain location: left leg Pain description: aching and sharp pain in left hip and toes  PRECAUTIONS: Fall  WEIGHT BEARING RESTRICTIONS: No  FALLS: Has patient fallen in last 6 months? Yes. Number of falls at least once  LIVING ENVIRONMENT: Lives with: lives with their spouse Lives in: House/apartment Stairs: No Has following equipment at home: Environmental consultant - 4 wheeled and AFO  PLOF: Independent with basic ADLs  PATIENT GOALS: improved LLE strength  NEXT MD APPOINTMENT: 1 year unless needed   OBJECTIVE:   DIAGNOSTIC FINDINGS: 06/18/22 Brain MRI w/o contrast IMPRESSION: 1. Stable white matter burden compared to the previous exams. While some of this is consistent with the given diagnosis of multiple sclerosis, other portions are likely related to chronic microvascular ischemia including the right thalamic lacunar infarct and changes in the brainstem. 2. No acute intracranial abnormality. COGNITION: Overall cognitive status: Within functional limits for tasks assessed   SENSATION: Patient reports global  tingling when not taking her Gabapentin. Absent sensation to light touch in bilateral lower extremities   COORDINATION: Intention tremor observed with testing  EDEMA:  No edema observed  LOWER  EXTREMITY ROM: WFL for activities assessed   LOWER EXTREMITY MMT:    MMT Right Eval Left Eval  Hip flexion 4-/5 3/5  Hip extension    Hip abduction    Hip adduction    Hip internal rotation    Hip external rotation    Knee flexion 4/5 4/5  Knee extension 4-/5 3+/5  Ankle dorsiflexion 3+/5 3/5  Ankle plantarflexion    Ankle inversion    Ankle eversion    (Blank rows = not tested)  TRANSFERS: Assistive device utilized: None  Sit to stand: SBA and Max A; patient required SBA initially, but required Max A at the conclusion of today's evaluation Stand to sit: SBA and Max A; patient required SBA initially, but required Max A at the conclusion of today's evaluation  GAIT: Gait pattern: step to pattern, decreased arm swing- Right, decreased arm swing- Left, decreased stride length, decreased hip/knee flexion- Right, decreased hip/knee flexion- Left, decreased ankle dorsiflexion- Right, decreased ankle dorsiflexion- Left, Right foot flat, Left foot flat, decreased trunk rotation, wide BOS, abducted- Right, poor foot clearance- Right, and poor foot clearance- Left Assistive device utilized: None Level of assistance: SBA and Total A Comments: Patient experienced a spastic attack as she was walking at the conclusion of today's evaluation and required total assistance from the treating physical therapist to prevent a fall  FUNCTIONAL TESTS:  Timed up and go (TUG): 33.26 seconds  TODAY'S TREATMENT:                                                                                                                              DATE:     PATIENT EDUCATION: Education detailsPension scheme manager, plan of care, prognosis, multiple sclerosis, and goals for therapy Person educated: Patient Education method:  Explanation Education comprehension: verbalized understanding  HOME EXERCISE PROGRAM:   GOALS: Goals reviewed with patient? Yes  LONG TERM GOALS: Target date: 10/25/22  Patient will be independent with her HEP. Baseline:  Goal status: INITIAL  2.  Patient will improve her timed up and go time to 20 seconds or less for improved safety. Baseline:  Goal status: INITIAL  3.  Patient will be able to demonstrate left lower extremity strength equal to or greater than her right lower extremity. Baseline:  Goal status: INITIAL  ASSESSMENT:  CLINICAL IMPRESSION: Patient is a 67 y.o. female who was seen today for physical therapy evaluation and treatment for left foot drop secondary to multiple sclerosis. She presented with decreased left lower extremity strength compared to the right.  She is also at a high fall risk as evidenced by her gait mechanics, objective measures, and her history of falling.  She also experienced increased spastic attack when she was leaving her initial evaluation and required maximal assistance from the treating physical therapist to prevent a fall. Her husband was contacted to safely get her home.  Recommend that she continue with skilled physical therapy to address her impairments to maximize  her safety and functional mobility.  OBJECTIVE IMPAIRMENTS: Abnormal gait, decreased activity tolerance, decreased balance, decreased coordination, decreased endurance, decreased mobility, difficulty walking, decreased strength, impaired sensation, impaired tone, postural dysfunction, and pain.   ACTIVITY LIMITATIONS: carrying, lifting, bending, standing, squatting, stairs, transfers, locomotion level, and caring for others  PARTICIPATION LIMITATIONS: meal prep, cleaning, laundry, shopping, community activity, yard work, and church  PERSONAL FACTORS: Behavior pattern, Past/current experiences, Time since onset of injury/illness/exacerbation, and 3+ comorbidities: Hypertension,  COPD, multiple sclerosis, osteoporosis, chronic back pain, bipolar, depression, anxiety, and arthritis  are also affecting patient's functional outcome.   REHAB POTENTIAL: Fair    CLINICAL DECISION MAKING: Unstable/unpredictable  EVALUATION COMPLEXITY: High  PLAN:  PT FREQUENCY: 1-2x/week  PT DURATION: 3 weeks  PLANNED INTERVENTIONS: Therapeutic exercises, Therapeutic activity, Neuromuscular re-education, Balance training, Gait training, Patient/Family education, Self Care, Stair training, Cryotherapy, Moist heat, and Re-evaluation  PLAN FOR NEXT SESSION: Light lower extremity strengthening (monitor patient to prevent overexertion)   Darlin Coco, PT 09/27/2022, 7:55 PM

## 2022-09-29 ENCOUNTER — Ambulatory Visit: Payer: Medicare PPO

## 2022-09-29 DIAGNOSIS — F339 Major depressive disorder, recurrent, unspecified: Secondary | ICD-10-CM | POA: Diagnosis not present

## 2022-09-29 DIAGNOSIS — R2689 Other abnormalities of gait and mobility: Secondary | ICD-10-CM

## 2022-09-29 DIAGNOSIS — G35 Multiple sclerosis: Secondary | ICD-10-CM | POA: Diagnosis not present

## 2022-09-29 DIAGNOSIS — R296 Repeated falls: Secondary | ICD-10-CM | POA: Diagnosis not present

## 2022-09-29 DIAGNOSIS — R400 Somnolence: Secondary | ICD-10-CM | POA: Diagnosis not present

## 2022-09-29 DIAGNOSIS — R2681 Unsteadiness on feet: Secondary | ICD-10-CM | POA: Diagnosis not present

## 2022-09-29 NOTE — Therapy (Signed)
OUTPATIENT PHYSICAL THERAPY NEURO TREATMENT   Patient Name: Angel Maddox MRN: [id number] DOB:03-04-1956, 67 y.o., female Today's Date: 09/29/2022   PCP: Monico Blitz, MD REFERRING PROVIDER: Wylene Simmer, MD   END OF SESSION:  PT End of Session - 09/29/22 1304     Visit Number 2    Number of Visits 6    Date for PT Re-Evaluation 10/28/22    PT Start Time 1300    PT Stop Time 1345    PT Time Calculation (min) 45 min    Activity Tolerance Patient tolerated treatment well    Behavior During Therapy [facility] for tasks assessed/performed             Past Medical History:  Diagnosis Date   Anxiety    Arthritis    osteo   Atypical mole 08/10/2021   Right Abdomen (side) upper (severe)   Bipolar 1 disorder    Cervicalgia    Chronic kidney disease    kidney stone   COPD (chronic obstructive pulmonary disease)    new diagnosis- now sees pulmonologist   Depression    Deviated septum    Elevated liver enzymes    She says "normal now"   GERD (gastroesophageal reflux disease)    H/O hiatal hernia    Headache    Hip fracture 2017   left   History of kidney stones    4-5 yrs ago   Hypertension    Multiple sclerosis     Had for 15 years   Multiple sclerosis    dx 1999   Tremors of nervous system    Past Surgical History:  Procedure Laterality Date   ANTERIOR CERVICAL DECOMP/DISCECTOMY FUSION N/A 07/28/2017   Procedure: CERVICAL FOUR-FIVE, CERVICAL FIVE-SIX ANTERIOR CERVICAL DECOMPRESSION/DISCECTOMY FUSION;  Surgeon: Eustace Moore, MD;  Location: [place];  Service: Neurosurgery;  Laterality: N/A;   COLONOSCOPY  2008   Dr.Kaplan   EYE SURGERY     [facility]   HIP PINNING,CANNULATED Left 11/09/2015   Procedure: CANNULATED HIP PINNING;  Surgeon: Rod Can, MD;  Location: [facility];  Service: Orthopedics;  Laterality: Left;   NASAL SEPTOPLASTY W/ TURBINOPLASTY Bilateral 01/12/2021   Procedure: NASAL SEPTOPLASTY WITH BILATERAL  TURBINATE REDUCTION;   Surgeon: Leta Baptist, MD;  Location: [place];  Service: ENT;  Laterality: Bilateral;   TUBAL LIGATION     Patient Active Problem List   Diagnosis Date Noted   COVID-19 virus infection 03/20/2020   'Light-for-dates' infant with signs of fetal malnutrition 05/06/2019   Seizure-like activity 12/27/2018   Dyslipidemia 12/20/2018   Hyponatremia syndrome 07/04/2018   Dyspnea on exertion 07/03/2018   COPD mixed type 07/03/2018   MDD (major depressive disorder) 06/28/2018   GAD (generalized anxiety disorder) 06/28/2018   Major depressive disorder, recurrent episode, moderate 04/17/2018   Head injury 12/08/2017   Cervical vertebral fusion 07/28/2017   DDD (degenerative disc disease), cervical 07/10/2017   DDD (degenerative disc disease), thoracic 07/10/2017   DDD (degenerative disc disease), lumbar 07/10/2017   Facet arthritis of cervical region 07/10/2017   Abnormal liver function tests 03/07/2017   Hypokalemia 03/07/2017   Gastroesophageal reflux disease with esophagitis 03/07/2017   Esophageal dysphagia 03/07/2017   Chronic idiopathic constipation 02/01/2017   Fatigue 02/01/2017   HSV-1 infection 01/12/2017   Osteoporosis with pathological fracture, with routine healing, subsequent encounter 08/30/2016   MDD (major depressive disorder), recurrent episode, severe 03/29/2016   Estrogen deficiency 12/15/2015   MS (multiple sclerosis) 12/15/2015  Tremor 12/15/2015   Healthcare maintenance 12/15/2015   Essential hypertension    Protein-calorie malnutrition, severe 11/09/2015   Bipolar 1 disorder, depressed 02/11/2015    ONSET DATE: 2-3 years ago  REFERRING DIAG: Foot drop, left foot   THERAPY DIAG:  Other abnormalities of gait and mobility  Unsteadiness on feet  Rationale for Evaluation and Treatment: Rehabilitation  SUBJECTIVE:                                                                                                                                                                                              SUBJECTIVE STATEMENT: Patient reports that she did not do anything yesterday. However, she experienced the same attack that she experienced while leaving her evaluation. She had to call her husband to come get her from her hair appointment. She thinks today has been good so far. She is scheduled to have a virtual appointment with her PA this afternoon.   Pt accompanied by: self  PERTINENT HISTORY: Hypertension, COPD, multiple sclerosis, osteoporosis, chronic back pain, bipolar, depression, anxiety, and arthritis  PAIN:  Are you having pain? Yes: NPRS scale: 0/10 Pain location: left leg Pain description: aching and sharp pain in left hip and toes  PRECAUTIONS: Fall  WEIGHT BEARING RESTRICTIONS: No  FALLS: Has patient fallen in last 6 months? Yes. Number of falls at least once  LIVING ENVIRONMENT: Lives with: lives with their spouse Lives in: House/apartment Stairs: No Has following equipment at home: Environmental consultant - 4 wheeled and AFO  PLOF: Independent with basic ADLs  PATIENT GOALS: improved LLE strength  NEXT MD APPOINTMENT: 1 year unless needed   OBJECTIVE:   DIAGNOSTIC FINDINGS: 06/18/22 Brain MRI w/o contrast IMPRESSION: 1. Stable white matter burden compared to the previous exams. While some of this is consistent with the given diagnosis of multiple sclerosis, other portions are likely related to chronic microvascular ischemia including the right thalamic lacunar infarct and changes in the brainstem. 2. No acute intracranial abnormality. COGNITION: Overall cognitive status: Within functional limits for tasks assessed   SENSATION: Patient reports global tingling when not taking her Gabapentin. Absent sensation to light touch in bilateral lower extremities   COORDINATION: Intention tremor observed with testing  EDEMA:  No edema observed  LOWER EXTREMITY ROM: WFL for activities assessed   LOWER EXTREMITY MMT:    MMT  Right Eval Left Eval  Hip flexion 4-/5 3/5  Hip extension    Hip abduction    Hip adduction    Hip internal rotation    Hip external rotation    Knee flexion 4/5 4/5  Knee extension 4-/5 3+/5  Ankle dorsiflexion 3+/5 3/5  Ankle plantarflexion    Ankle inversion    Ankle eversion    (Blank rows = not tested)  TRANSFERS: Assistive device utilized: None  Sit to stand: SBA and Max A; patient required SBA initially, but required Max A at the conclusion of today's evaluation Stand to sit: SBA and Max A; patient required SBA initially, but required Max A at the conclusion of today's evaluation  GAIT: Gait pattern: step to pattern, decreased arm swing- Right, decreased arm swing- Left, decreased Maddox length, decreased hip/knee flexion- Right, decreased hip/knee flexion- Left, decreased ankle dorsiflexion- Right, decreased ankle dorsiflexion- Left, Right foot flat, Left foot flat, decreased trunk rotation, wide BOS, abducted- Right, poor foot clearance- Right, and poor foot clearance- Left Assistive device utilized: None Level of assistance: SBA and Total A Comments: Patient experienced a spastic attack as she was walking at the conclusion of today's evaluation and required total assistance from the treating physical therapist to prevent a fall  FUNCTIONAL TESTS:  Timed up and go (TUG): 33.26 seconds  TODAY'S TREATMENT:                                                                                                                              DATE:                                     4/4 EXERCISE LOG  Exercise Repetitions and Resistance Comments  LAQ 2# x 2 minutes   Seated marching  3# x 2 minutes   Standing hip ABD  20 reps each    Standing heel raise  30 reps    Seated hip ADD isometric  2 minutes w/ 5 second hold     Blank cell = exercise not performed today   PATIENT EDUCATION: Education details: Safety, plan of care, prognosis, multiple sclerosis, and goals for  therapy Person educated: Patient Education method: Explanation Education comprehension: verbalized understanding  HOME EXERCISE PROGRAM:   GOALS: Goals reviewed with patient? Yes  LONG TERM GOALS: Target date: 10/25/22  Patient will be independent with her HEP. Baseline:  Goal status: INITIAL  2.  Patient will improve her timed up and go time to 20 seconds or less for improved safety. Baseline:  Goal status: INITIAL  3.  Patient will be able to demonstrate left lower extremity strength equal to or greater than her right lower extremity. Baseline:  Goal status: INITIAL  ASSESSMENT:  CLINICAL IMPRESSION: Patient presented to treatment utilizing a Rolator for ambulation. She was able to demonstrate improved gait speed and foot clearance when utilizing a Rolator compared to without an assistive device. She was introduced to multiple new interventions for improved lower extremity strength and endurance needed to complete daily activities. She required minimal cueing with today's interventions for proper exercise performance. Muscular fatigue was her primary limitation with today's interventions as  she experienced a decrease in step length and foot clearance as treatment progressed. Multiple rest breaks were utilized throughout treatment to avoid a significant increase in fatigue. She was educated on communicating with her physician or pharmacist regarding her medication concerns and their potential side effects. She reported feeling good upon the conclusion of treatment and was able to safely ambulate with her Rolator. She continues to require skilled physical therapy to address her impairments to maximize her safety and functional mobility.   OBJECTIVE IMPAIRMENTS: Abnormal gait, decreased activity tolerance, decreased balance, decreased coordination, decreased endurance, decreased mobility, difficulty walking, decreased strength, impaired sensation, impaired tone, postural dysfunction, and  pain.   ACTIVITY LIMITATIONS: carrying, lifting, bending, standing, squatting, stairs, transfers, locomotion level, and caring for others  PARTICIPATION LIMITATIONS: meal prep, cleaning, laundry, shopping, community activity, yard work, and church  PERSONAL FACTORS: Behavior pattern, Past/current experiences, Time since onset of injury/illness/exacerbation, and 3+ comorbidities: Hypertension, COPD, multiple sclerosis, osteoporosis, chronic back pain, bipolar, depression, anxiety, and arthritis  are also affecting patient's functional outcome.   REHAB POTENTIAL: Fair    CLINICAL DECISION MAKING: Unstable/unpredictable  EVALUATION COMPLEXITY: High  PLAN:  PT FREQUENCY: 1-2x/week  PT DURATION: 3 weeks  PLANNED INTERVENTIONS: Therapeutic exercises, Therapeutic activity, Neuromuscular re-education, Balance training, Gait training, Patient/Family education, Self Care, Stair training, Cryotherapy, Moist heat, and Re-evaluation  PLAN FOR NEXT SESSION: Light lower extremity strengthening (monitor patient to prevent overexertion)   Darlin Coco, PT 09/29/2022, 2:24 PM

## 2022-10-03 ENCOUNTER — Other Ambulatory Visit: Payer: Self-pay | Admitting: Physician Assistant

## 2022-10-03 ENCOUNTER — Ambulatory Visit: Payer: Medicare PPO

## 2022-10-03 DIAGNOSIS — R2681 Unsteadiness on feet: Secondary | ICD-10-CM

## 2022-10-03 DIAGNOSIS — R2689 Other abnormalities of gait and mobility: Secondary | ICD-10-CM | POA: Diagnosis not present

## 2022-10-03 NOTE — Therapy (Signed)
OUTPATIENT PHYSICAL THERAPY NEURO TREATMENT   Patient Name: Angel Maddox MRN: [id number] DOB:October 06, 1955, 67 y.o., female Today's Date: 10/03/2022   PCP: Kirstie Peri, MD REFERRING PROVIDER: Toni Arthurs, MD   END OF SESSION:  PT End of Session - 10/03/22 1302     Visit Number 3    Number of Visits 6    Date for PT Re-Evaluation 10/28/22    PT Start Time 1300    PT Stop Time 1338    PT Time Calculation (min) 38 min    Activity Tolerance Patient tolerated treatment well    Behavior During Therapy [facility] for tasks assessed/performed             Past Medical History:  Diagnosis Date   Anxiety    Arthritis    osteo   Atypical mole 08/10/2021   Right Abdomen (side) upper (severe)   Bipolar 1 disorder    Cervicalgia    Chronic kidney disease    kidney stone   COPD (chronic obstructive pulmonary disease)    new diagnosis- now sees pulmonologist   Depression    Deviated septum    Elevated liver enzymes    She says "normal now"   GERD (gastroesophageal reflux disease)    H/O hiatal hernia    Headache    Hip fracture 2017   left   History of kidney stones    4-5 yrs ago   Hypertension    Multiple sclerosis     Had for 15 years   Multiple sclerosis    dx 1999   Tremors of nervous system    Past Surgical History:  Procedure Laterality Date   ANTERIOR CERVICAL DECOMP/DISCECTOMY FUSION N/A 07/28/2017   Procedure: CERVICAL FOUR-FIVE, CERVICAL FIVE-SIX ANTERIOR CERVICAL DECOMPRESSION/DISCECTOMY FUSION;  Surgeon: Tia Alert, MD;  Location: [facility] OR;  Service: Neurosurgery;  Laterality: N/A;   COLONOSCOPY  2008   Dr.Kaplan   EYE SURGERY     [facility]   HIP PINNING,CANNULATED Left 11/09/2015   Procedure: CANNULATED HIP PINNING;  Surgeon: Samson Frederic, MD;  Location: [facility] ORS;  Service: Orthopedics;  Laterality: Left;   NASAL SEPTOPLASTY W/ TURBINOPLASTY Bilateral 01/12/2021   Procedure: NASAL SEPTOPLASTY WITH BILATERAL  TURBINATE REDUCTION;   Surgeon: Robleto Pies, MD;  Location: [facility];  Service: ENT;  Laterality: Bilateral;   TUBAL LIGATION     Patient Active Problem List   Diagnosis Date Noted   COVID-19 virus infection 03/20/2020   'Light-for-dates' infant with signs of fetal malnutrition 05/06/2019   Seizure-like activity 12/27/2018   Dyslipidemia 12/20/2018   Hyponatremia syndrome 07/04/2018   Dyspnea on exertion 07/03/2018   COPD mixed type 07/03/2018   MDD (major depressive disorder) 06/28/2018   GAD (generalized anxiety disorder) 06/28/2018   Major depressive disorder, recurrent episode, moderate 04/17/2018   Head injury 12/08/2017   Cervical vertebral fusion 07/28/2017   DDD (degenerative disc disease), cervical 07/10/2017   DDD (degenerative disc disease), thoracic 07/10/2017   DDD (degenerative disc disease), lumbar 07/10/2017   Facet arthritis of cervical region 07/10/2017   Abnormal liver function tests 03/07/2017   Hypokalemia 03/07/2017   Gastroesophageal reflux disease with esophagitis 03/07/2017   Esophageal dysphagia 03/07/2017   Chronic idiopathic constipation 02/01/2017   Fatigue 02/01/2017   HSV-1 infection 01/12/2017   Osteoporosis with pathological fracture, with routine healing, subsequent encounter 08/30/2016   MDD (major depressive disorder), recurrent episode, severe 03/29/2016   Estrogen deficiency 12/15/2015   MS (multiple sclerosis) 12/15/2015  Tremor 12/15/2015   Healthcare maintenance 12/15/2015   Essential hypertension    Protein-calorie malnutrition, severe 11/09/2015   Bipolar 1 disorder, depressed 02/11/2015    ONSET DATE: 2-3 years ago  REFERRING DIAG: Foot drop, left foot   THERAPY DIAG:  Other abnormalities of gait and mobility  Unsteadiness on feet  Rationale for Evaluation and Treatment: Rehabilitation  SUBJECTIVE:                                                                                                                                                                                              SUBJECTIVE STATEMENT: Patient reports that he felt alright after her last appointment. She notes that she had a long day Saturday which caused her to be really tired all day Sunday. She had to stay with her grandson last night as his guardians had to leave. This has caused her to be tired today as well. [place] grandsons birthday party.  Pt accompanied by: self  PERTINENT HISTORY: Hypertension, COPD, multiple sclerosis, osteoporosis, chronic back pain, bipolar, depression, anxiety, and arthritis  PAIN:  Are you having pain? Yes: NPRS scale: 0/10 Pain location: left leg Pain description: aching and sharp pain in left hip and toes  PRECAUTIONS: Fall  WEIGHT BEARING RESTRICTIONS: No  FALLS: Has patient fallen in last 6 months? Yes. Number of falls at least once  LIVING ENVIRONMENT: Lives with: lives with their spouse Lives in: House/apartment Stairs: No Has following equipment at home: Environmental consultantWalker - 4 wheeled and AFO  PLOF: Independent with basic ADLs  PATIENT GOALS: improved LLE strength  NEXT MD APPOINTMENT: 1 year unless needed   OBJECTIVE:   DIAGNOSTIC FINDINGS: 06/18/22 Brain MRI w/o contrast IMPRESSION: 1. Stable white matter burden compared to the previous exams. While some of this is consistent with the given diagnosis of multiple sclerosis, other portions are likely related to chronic microvascular ischemia including the right thalamic lacunar infarct and changes in the brainstem. 2. No acute intracranial abnormality. COGNITION: Overall cognitive status: Within functional limits for tasks assessed   SENSATION: Patient reports global tingling when not taking her Gabapentin. Absent sensation to light touch in bilateral lower extremities   COORDINATION: Intention tremor observed with testing  EDEMA:  No edema observed  LOWER EXTREMITY ROM: WFL for activities  assessed   LOWER EXTREMITY MMT:    MMT Right Eval Left Eval  Hip flexion 4-/5 3/5  Hip extension    Hip abduction    Hip adduction    Hip internal rotation    Hip external rotation  Knee flexion 4/5 4/5  Knee extension 4-/5 3+/5  Ankle dorsiflexion 3+/5 3/5  Ankle plantarflexion    Ankle inversion    Ankle eversion    (Blank rows = not tested)  TRANSFERS: Assistive device utilized: None  Sit to stand: SBA and Max A; patient required SBA initially, but required Max A at the conclusion of today's evaluation Stand to sit: SBA and Max A; patient required SBA initially, but required Max A at the conclusion of today's evaluation  GAIT: Gait pattern: step to pattern, decreased arm swing- Right, decreased arm swing- Left, decreased stride length, decreased hip/knee flexion- Right, decreased hip/knee flexion- Left, decreased ankle dorsiflexion- Right, decreased ankle dorsiflexion- Left, Right foot flat, Left foot flat, decreased trunk rotation, wide BOS, abducted- Right, poor foot clearance- Right, and poor foot clearance- Left Assistive device utilized: None Level of assistance: SBA and Total A Comments: Patient experienced a spastic attack as she was walking at the conclusion of today's evaluation and required total assistance from the treating physical therapist to prevent a fall  FUNCTIONAL TESTS:  Timed up and go (TUG): 33.26 seconds  TODAY'S TREATMENT:                                                                                                                              DATE:                                     4/8 EXERCISE LOG  Exercise Repetitions and Resistance Comments  LAQ 2# x 2 minutes   Seated heel/toe raises  2# x 2 minutes   Cone taps  25 reps each  BUE support; 2 cones forward   Standing HS curl  25 reps each  BUE support  Static stance on foam  2 x 30 seconds BUE support   Seated hip ADD isometric  2 minutes w/ 5 second hold    Seated clams  Red t-band x  2 minutes    Nustep L3 x 4 minutes    Blank cell = exercise not performed today                                    4/4 EXERCISE LOG  Exercise Repetitions and Resistance Comments  LAQ 2# x 2 minutes   Seated marching  3# x 2 minutes   Standing hip ABD  20 reps each    Standing heel raise  30 reps    Seated hip ADD isometric  2 minutes w/ 5 second hold     Blank cell = exercise not performed today   PATIENT EDUCATION: Education details: Safety, plan of care, prognosis, multiple sclerosis, and goals for therapy Person educated: Patient Education method: Explanation Education comprehension: verbalized understanding  HOME EXERCISE PROGRAM:   GOALS: Goals reviewed  with patient? Yes  LONG TERM GOALS: Target date: 10/25/22  Patient will be independent with her HEP. Baseline:  Goal status: INITIAL  2.  Patient will improve her timed up and go time to 20 seconds or less for improved safety. Baseline:  Goal status: INITIAL  3.  Patient will be able to demonstrate left lower extremity strength equal to or greater than her right lower extremity. Baseline:  Goal status: INITIAL  ASSESSMENT:  CLINICAL IMPRESSION: Patient was introduced to multiple new standing interventions for improved static and dynamic stability. She required minimal cueing with cone taps for a slow and controlled pace to avoid knocking the cones over. Fatigue was her primary limitation with today's interventions. However, it did not inhibit her gait pattern at the conclusion of these interventions. She reported feeling tired upon the conclusion of treatment. She continues to require skilled physical therapy to address her impairments to maximize her safety and functional mobility.    OBJECTIVE IMPAIRMENTS: Abnormal gait, decreased activity tolerance, decreased balance, decreased coordination, decreased endurance, decreased mobility, difficulty walking, decreased strength, impaired sensation, impaired tone, postural  dysfunction, and pain.   ACTIVITY LIMITATIONS: carrying, lifting, bending, standing, squatting, stairs, transfers, locomotion level, and caring for others  PARTICIPATION LIMITATIONS: meal prep, cleaning, laundry, shopping, community activity, yard work, and church  PERSONAL FACTORS: Behavior pattern, Past/current experiences, Time since onset of injury/illness/exacerbation, and 3+ comorbidities: Hypertension, COPD, multiple sclerosis, osteoporosis, chronic back pain, bipolar, depression, anxiety, and arthritis  are also affecting patient's functional outcome.   REHAB POTENTIAL: Fair    CLINICAL DECISION MAKING: Unstable/unpredictable  EVALUATION COMPLEXITY: High  PLAN:  PT FREQUENCY: 1-2x/week  PT DURATION: 3 weeks  PLANNED INTERVENTIONS: Therapeutic exercises, Therapeutic activity, Neuromuscular re-education, Balance training, Gait training, Patient/Family education, Self Care, Stair training, Cryotherapy, Moist heat, and Re-evaluation  PLAN FOR NEXT SESSION: Light lower extremity strengthening (monitor patient to prevent overexertion)   Granville Lewis, PT 10/03/2022, 1:55 PM

## 2022-10-04 ENCOUNTER — Other Ambulatory Visit: Payer: Self-pay | Admitting: Nurse Practitioner

## 2022-10-04 DIAGNOSIS — R296 Repeated falls: Secondary | ICD-10-CM

## 2022-10-04 DIAGNOSIS — G35 Multiple sclerosis: Secondary | ICD-10-CM

## 2022-10-04 DIAGNOSIS — M545 Low back pain, unspecified: Secondary | ICD-10-CM

## 2022-10-05 NOTE — Telephone Encounter (Signed)
Is she still taking?

## 2022-10-06 ENCOUNTER — Ambulatory Visit: Payer: Medicare PPO

## 2022-10-07 ENCOUNTER — Ambulatory Visit: Payer: Medicare PPO

## 2022-10-07 ENCOUNTER — Inpatient Hospital Stay (HOSPITAL_COMMUNITY): Admission: RE | Admit: 2022-10-07 | Payer: Medicare PPO | Source: Ambulatory Visit

## 2022-10-07 DIAGNOSIS — R2689 Other abnormalities of gait and mobility: Secondary | ICD-10-CM | POA: Diagnosis not present

## 2022-10-07 DIAGNOSIS — R2681 Unsteadiness on feet: Secondary | ICD-10-CM | POA: Diagnosis not present

## 2022-10-07 NOTE — Therapy (Signed)
OUTPATIENT PHYSICAL THERAPY NEURO TREATMENT   Patient Name: Angel Maddox MRN: [id number] DOB:March 07, 1956, 67 y.o., female Today's Date: 10/07/2022   PCP: Kirstie Peri, MD REFERRING PROVIDER: Toni Arthurs, MD   END OF SESSION:  PT End of Session - 10/07/22 1122     Visit Number 4    Number of Visits 6    Date for PT Re-Evaluation 10/28/22    PT Start Time 1115    PT Stop Time 1200    PT Time Calculation (min) 45 min    Activity Tolerance Patient tolerated treatment well    Behavior During Therapy [facility] for tasks assessed/performed             Past Medical History:  Diagnosis Date   Anxiety    Arthritis    osteo   Atypical mole 08/10/2021   Right Abdomen (side) upper (severe)   Bipolar 1 disorder    Cervicalgia    Chronic kidney disease    kidney stone   COPD (chronic obstructive pulmonary disease)    new diagnosis- now sees pulmonologist   Depression    Deviated septum    Elevated liver enzymes    She says "normal now"   GERD (gastroesophageal reflux disease)    H/O hiatal hernia    Headache    Hip fracture 2017   left   History of kidney stones    4-5 yrs ago   Hypertension    Multiple sclerosis     Had for 15 years   Multiple sclerosis    dx 1999   Tremors of nervous system    Past Surgical History:  Procedure Laterality Date   ANTERIOR CERVICAL DECOMP/DISCECTOMY FUSION N/A 07/28/2017   Procedure: CERVICAL FOUR-FIVE, CERVICAL FIVE-SIX ANTERIOR CERVICAL DECOMPRESSION/DISCECTOMY FUSION;  Surgeon: Tia Alert, MD;  Location: [facility] OR;  Service: Neurosurgery;  Laterality: N/A;   COLONOSCOPY  2008   Dr.Kaplan   EYE SURGERY     [facility]   HIP PINNING,CANNULATED Left 11/09/2015   Procedure: CANNULATED HIP PINNING;  Surgeon: Samson Frederic, MD;  Location: [facility];  Service: Orthopedics;  Laterality: Left;   NASAL SEPTOPLASTY W/ TURBINOPLASTY Bilateral 01/12/2021   Procedure: NASAL SEPTOPLASTY WITH BILATERAL  TURBINATE REDUCTION;   Surgeon: Darin Pies, MD;  Location: [facility];  Service: ENT;  Laterality: Bilateral;   TUBAL LIGATION     Patient Active Problem List   Diagnosis Date Noted   COVID-19 virus infection 03/20/2020   'Light-for-dates' infant with signs of fetal malnutrition 05/06/2019   Seizure-like activity 12/27/2018   Dyslipidemia 12/20/2018   Hyponatremia syndrome 07/04/2018   Dyspnea on exertion 07/03/2018   COPD mixed type 07/03/2018   MDD (major depressive disorder) 06/28/2018   GAD (generalized anxiety disorder) 06/28/2018   Major depressive disorder, recurrent episode, moderate 04/17/2018   Head injury 12/08/2017   Cervical vertebral fusion 07/28/2017   DDD (degenerative disc disease), cervical 07/10/2017   DDD (degenerative disc disease), thoracic 07/10/2017   DDD (degenerative disc disease), lumbar 07/10/2017   Facet arthritis of cervical region 07/10/2017   Abnormal liver function tests 03/07/2017   Hypokalemia 03/07/2017   Gastroesophageal reflux disease with esophagitis 03/07/2017   Esophageal dysphagia 03/07/2017   Chronic idiopathic constipation 02/01/2017   Fatigue 02/01/2017   HSV-1 infection 01/12/2017   Osteoporosis with pathological fracture, with routine healing, subsequent encounter 08/30/2016   MDD (major depressive disorder), recurrent episode, severe 03/29/2016   Estrogen deficiency 12/15/2015   MS (multiple sclerosis) 12/15/2015  Tremor 12/15/2015   Healthcare maintenance 12/15/2015   Essential hypertension    Protein-calorie malnutrition, severe 11/09/2015   Bipolar 1 disorder, depressed 02/11/2015    ONSET DATE: 2-3 years ago  REFERRING DIAG: Foot drop, left foot   THERAPY DIAG:  Other abnormalities of gait and mobility  Unsteadiness on feet  Rationale for Evaluation and Treatment: Rehabilitation  SUBJECTIVE:                                                                                                                                                                                              SUBJECTIVE STATEMENT: Patient reports that she feels good today. She is scheduled to go to [facility] this afternoon to be fitted for a brace. She reported feeling good after her last appointment. She went to a party in [place] that afternoon. She felt good walking into the party, but when she tried to go the bathroom her left leg was weak and her right leg was dragging. She had to have help walking to the bathroom and walking back to her car.  Pt accompanied by: self  PERTINENT HISTORY: Hypertension, COPD, multiple sclerosis, osteoporosis, chronic back pain, bipolar, depression, anxiety, and arthritis  PAIN:  Are you having pain? Yes: NPRS scale: 0/10 Pain location: left leg Pain description: aching and sharp pain in left hip and toes  PRECAUTIONS: Fall  WEIGHT BEARING RESTRICTIONS: No  FALLS: Has patient fallen in last 6 months? Yes. Number of falls at least once  LIVING ENVIRONMENT: Lives with: lives with their spouse Lives in: House/apartment Stairs: No Has following equipment at home: Environmental consultant - 4 wheeled and AFO  PLOF: Independent with basic ADLs  PATIENT GOALS: improved LLE strength  NEXT MD APPOINTMENT: 1 year unless needed   OBJECTIVE:   DIAGNOSTIC FINDINGS: 06/18/22 Brain MRI w/o contrast IMPRESSION: 1. Stable white matter burden compared to the previous exams. While some of this is consistent with the given diagnosis of multiple sclerosis, other portions are likely related to chronic microvascular ischemia including the right thalamic lacunar infarct and changes in the brainstem. 2. No acute intracranial abnormality. COGNITION: Overall cognitive status: Within functional limits for tasks assessed   SENSATION: Patient reports global tingling when not taking her Gabapentin. Absent sensation to light touch in bilateral lower extremities   COORDINATION: Intention tremor observed with testing  EDEMA:  No edema  observed  LOWER EXTREMITY ROM: WFL for activities assessed   LOWER EXTREMITY MMT:    MMT Right Eval Left Eval  Hip flexion 4-/5 3/5  Hip extension    Hip abduction    Hip adduction  Hip internal rotation    Hip external rotation    Knee flexion 4/5 4/5  Knee extension 4-/5 3+/5  Ankle dorsiflexion 3+/5 3/5  Ankle plantarflexion    Ankle inversion    Ankle eversion    (Blank rows = not tested)  TRANSFERS: Assistive device utilized: None  Sit to stand: SBA and Max A; patient required SBA initially, but required Max A at the conclusion of today's evaluation Stand to sit: SBA and Max A; patient required SBA initially, but required Max A at the conclusion of today's evaluation  GAIT: Gait pattern: step to pattern, decreased arm swing- Right, decreased arm swing- Left, decreased stride length, decreased hip/knee flexion- Right, decreased hip/knee flexion- Left, decreased ankle dorsiflexion- Right, decreased ankle dorsiflexion- Left, Right foot flat, Left foot flat, decreased trunk rotation, wide BOS, abducted- Right, poor foot clearance- Right, and poor foot clearance- Left Assistive device utilized: None Level of assistance: SBA and Total A Comments: Patient experienced a spastic attack as she was walking at the conclusion of today's evaluation and required total assistance from the treating physical therapist to prevent a fall  FUNCTIONAL TESTS:  Timed up and go (TUG): 33.26 seconds  TODAY'S TREATMENT:                                                                                                                              DATE:                                     4/12 EXERCISE LOG  Exercise Repetitions and Resistance Comments  Nustep  L1 x 8 minutes   Seated hip ADD isometric  2.5 minutes w/ 5 second hold    Seated heel/toe raise  2 minutes   LAQ 2# x 20 reps each    Seated clams Red t-band x 3 minutes     Blank cell = exercise not performed today                                     4/8 EXERCISE LOG  Exercise Repetitions and Resistance Comments  LAQ 2# x 2 minutes   Seated heel/toe raises  2# x 2 minutes   Cone taps  25 reps each  BUE support; 2 cones forward   Standing HS curl  25 reps each  BUE support  Static stance on foam  2 x 30 seconds BUE support   Seated hip ADD isometric  2 minutes w/ 5 second hold    Seated clams  Red t-band x 2 minutes    Nustep L3 x 4 minutes    Blank cell = exercise not performed today  4/4 EXERCISE LOG  Exercise Repetitions and Resistance Comments  LAQ 2# x 2 minutes   Seated marching  3# x 2 minutes   Standing hip ABD  20 reps each    Standing heel raise  30 reps    Seated hip ADD isometric  2 minutes w/ 5 second hold     Blank cell = exercise not performed today   PATIENT EDUCATION: Education details: Safety, plan of care, prognosis, multiple sclerosis, and goals for therapy Person educated: Patient Education method: Explanation Education comprehension: verbalized understanding  HOME EXERCISE PROGRAM:   GOALS: Goals reviewed with patient? Yes  LONG TERM GOALS: Target date: 10/25/22  Patient will be independent with her HEP. Baseline:  Goal status: INITIAL  2.  Patient will improve her timed up and go time to 20 seconds or less for improved safety. Baseline:  Goal status: INITIAL  3.  Patient will be able to demonstrate left lower extremity strength equal to or greater than her right lower extremity. Baseline:  Goal status: INITIAL  ASSESSMENT:  CLINICAL IMPRESSION: Treatment focused on familiar interventions for improved lower extremity strength and endurance. Fatigue was her primary limitation with today's interventions as seated hamstring curls was the most difficult. She was educated on safe exercise habits to avoid overexertion. She reported feeling a little tired upon the conclusion of treatment. She continues to require skilled physical therapy to address  her remaining impairments to maximize her safety and functional mobility.   OBJECTIVE IMPAIRMENTS: Abnormal gait, decreased activity tolerance, decreased balance, decreased coordination, decreased endurance, decreased mobility, difficulty walking, decreased strength, impaired sensation, impaired tone, postural dysfunction, and pain.   ACTIVITY LIMITATIONS: carrying, lifting, bending, standing, squatting, stairs, transfers, locomotion level, and caring for others  PARTICIPATION LIMITATIONS: meal prep, cleaning, laundry, shopping, community activity, yard work, and church  PERSONAL FACTORS: Behavior pattern, Past/current experiences, Time since onset of injury/illness/exacerbation, and 3+ comorbidities: Hypertension, COPD, multiple sclerosis, osteoporosis, chronic back pain, bipolar, depression, anxiety, and arthritis  are also affecting patient's functional outcome.   REHAB POTENTIAL: Fair    CLINICAL DECISION MAKING: Unstable/unpredictable  EVALUATION COMPLEXITY: High  PLAN:  PT FREQUENCY: 1-2x/week  PT DURATION: 3 weeks  PLANNED INTERVENTIONS: Therapeutic exercises, Therapeutic activity, Neuromuscular re-education, Balance training, Gait training, Patient/Family education, Self Care, Stair training, Cryotherapy, Moist heat, and Re-evaluation  PLAN FOR NEXT SESSION: Light lower extremity strengthening (monitor patient to prevent overexertion)   Granville Lewis, PT 10/07/2022, 12:23 PM

## 2022-10-10 ENCOUNTER — Encounter: Payer: Self-pay | Admitting: *Deleted

## 2022-10-10 ENCOUNTER — Ambulatory Visit: Payer: Medicare PPO | Admitting: *Deleted

## 2022-10-10 DIAGNOSIS — R2681 Unsteadiness on feet: Secondary | ICD-10-CM | POA: Diagnosis not present

## 2022-10-10 DIAGNOSIS — R2689 Other abnormalities of gait and mobility: Secondary | ICD-10-CM | POA: Diagnosis not present

## 2022-10-10 NOTE — Therapy (Signed)
OUTPATIENT PHYSICAL THERAPY NEURO TREATMENT   Patient Name: Angel Maddox MRN: [id number] DOB:01-15-1956, 67 y.o., female Today's Date: 10/10/2022   PCP: Kirstie Peri, MD REFERRING PROVIDER: Toni Arthurs, MD   END OF SESSION:  PT End of Session - 10/10/22 1121     Visit Number 5    Number of Visits 6    Date for PT Re-Evaluation 10/28/22    PT Start Time 1115    PT Stop Time 1155    PT Time Calculation (min) 40 min             Past Medical History:  Diagnosis Date   Anxiety    Arthritis    osteo   Atypical mole 08/10/2021   Right Abdomen (side) upper (severe)   Bipolar 1 disorder    Cervicalgia    Chronic kidney disease    kidney stone   COPD (chronic obstructive pulmonary disease)    new diagnosis- now sees pulmonologist   Depression    Deviated septum    Elevated liver enzymes    She says "normal now"   GERD (gastroesophageal reflux disease)    H/O hiatal hernia    Headache    Hip fracture 2017   left   History of kidney stones    4-5 yrs ago   Hypertension    Multiple sclerosis     Had for 15 years   Multiple sclerosis    dx 1999   Tremors of nervous system    Past Surgical History:  Procedure Laterality Date   ANTERIOR CERVICAL DECOMP/DISCECTOMY FUSION N/A 07/28/2017   Procedure: CERVICAL FOUR-FIVE, CERVICAL FIVE-SIX ANTERIOR CERVICAL DECOMPRESSION/DISCECTOMY FUSION;  Surgeon: Tia Alert, MD;  Location: [facility] OR;  Service: Neurosurgery;  Laterality: N/A;   COLONOSCOPY  2008   Dr.Kaplan   EYE SURGERY     [facility]   HIP PINNING,CANNULATED Left 11/09/2015   Procedure: CANNULATED HIP PINNING;  Surgeon: Samson Frederic, MD;  Location: [facility] ORS;  Service: Orthopedics;  Laterality: Left;   NASAL SEPTOPLASTY W/ TURBINOPLASTY Bilateral 01/12/2021   Procedure: NASAL SEPTOPLASTY WITH BILATERAL  TURBINATE REDUCTION;  Surgeon: Shippey Pies, MD;  Location: [facility];  Service: ENT;  Laterality: Bilateral;   TUBAL LIGATION      Patient Active Problem List   Diagnosis Date Noted   COVID-19 virus infection 03/20/2020   'Light-for-dates' infant with signs of fetal malnutrition 05/06/2019   Seizure-like activity 12/27/2018   Dyslipidemia 12/20/2018   Hyponatremia syndrome 07/04/2018   Dyspnea on exertion 07/03/2018   COPD mixed type 07/03/2018   MDD (major depressive disorder) 06/28/2018   GAD (generalized anxiety disorder) 06/28/2018   Major depressive disorder, recurrent episode, moderate 04/17/2018   Head injury 12/08/2017   Cervical vertebral fusion 07/28/2017   DDD (degenerative disc disease), cervical 07/10/2017   DDD (degenerative disc disease), thoracic 07/10/2017   DDD (degenerative disc disease), lumbar 07/10/2017   Facet arthritis of cervical region 07/10/2017   Abnormal liver function tests 03/07/2017   Hypokalemia 03/07/2017   Gastroesophageal reflux disease with esophagitis 03/07/2017   Esophageal dysphagia 03/07/2017   Chronic idiopathic constipation 02/01/2017   Fatigue 02/01/2017   HSV-1 infection 01/12/2017   Osteoporosis with pathological fracture, with routine healing, subsequent encounter 08/30/2016   MDD (major depressive disorder), recurrent episode, severe 03/29/2016   Estrogen deficiency 12/15/2015   MS (multiple sclerosis) 12/15/2015   Tremor 12/15/2015   Healthcare maintenance 12/15/2015   Essential hypertension    Protein-calorie malnutrition, severe 11/09/2015  Bipolar 1 disorder, depressed 02/11/2015    ONSET DATE: 2-3 years ago  REFERRING DIAG: Foot drop, left foot   THERAPY DIAG:  Other abnormalities of gait and mobility  Unsteadiness on feet  Balance problem  Rationale for Evaluation and Treatment: Rehabilitation  SUBJECTIVE:                                                                                                                                                                                             SUBJECTIVE STATEMENT: Patient reports that  she feels good today, but she did plant some flowers this morning.   PERTINENT HISTORY: Hypertension, COPD, multiple sclerosis, osteoporosis, chronic back pain, bipolar, depression, anxiety, and arthritis  PAIN:  Are you having pain? Yes: NPRS scale: 0/10 Pain location: left leg Pain description: aching and sharp pain in left hip and toes  PRECAUTIONS: Fall  WEIGHT BEARING RESTRICTIONS: No  FALLS: Has patient fallen in last 6 months? Yes. Number of falls at least once  LIVING ENVIRONMENT: Lives with: lives with their spouse Lives in: House/apartment Stairs: No Has following equipment at home: Environmental consultant - 4 wheeled and AFO  PLOF: Independent with basic ADLs  PATIENT GOALS: improved LLE strength  NEXT MD APPOINTMENT: 1 year unless needed   OBJECTIVE:   DIAGNOSTIC FINDINGS: 06/18/22 Brain MRI w/o contrast IMPRESSION: 1. Stable white matter burden compared to the previous exams. While some of this is consistent with the given diagnosis of multiple sclerosis, other portions are likely related to chronic microvascular ischemia including the right thalamic lacunar infarct and changes in the brainstem. 2. No acute intracranial abnormality. COGNITION: Overall cognitive status: Within functional limits for tasks assessed   SENSATION: Patient reports global tingling when not taking her Gabapentin. Absent sensation to light touch in bilateral lower extremities   COORDINATION: Intention tremor observed with testing  EDEMA:  No edema observed  LOWER EXTREMITY ROM: WFL for activities assessed   LOWER EXTREMITY MMT:    MMT Right Eval Left Eval  Hip flexion 4-/5 3/5  Hip extension    Hip abduction    Hip adduction    Hip internal rotation    Hip external rotation    Knee flexion 4/5 4/5  Knee extension 4-/5 3+/5  Ankle dorsiflexion 3+/5 3/5  Ankle plantarflexion    Ankle inversion    Ankle eversion    (Blank rows = not tested)  TRANSFERS: Assistive device utilized:  None  Sit to stand: SBA and Max A; patient required SBA initially, but required Max A at the conclusion of today's evaluation Stand to sit: SBA and Max A; patient required SBA initially,  but required Max A at the conclusion of today's evaluation  GAIT: Gait pattern: step to pattern, decreased arm swing- Right, decreased arm swing- Left, decreased stride length, decreased hip/knee flexion- Right, decreased hip/knee flexion- Left, decreased ankle dorsiflexion- Right, decreased ankle dorsiflexion- Left, Right foot flat, Left foot flat, decreased trunk rotation, wide BOS, abducted- Right, poor foot clearance- Right, and poor foot clearance- Left Assistive device utilized: None Level of assistance: SBA and Total A Comments: Patient experienced a spastic attack as she was walking at the conclusion of today's evaluation and required total assistance from the treating physical therapist to prevent a fall  FUNCTIONAL TESTS:  Timed up and go (TUG): 33.26 seconds  TODAY'S TREATMENT:                                                                                                                              DATE:                                     4/15 EXERCISE LOG  Exercise Repetitions and Resistance Comments  Nustep  L1 x 5 minutes   Seated hip ADD isometric  2.5 minutes w/ 5 second hold    Seated heel/toe raise  2 minutes   LAQ 2# x 20 reps each    Seated clams Red t-band x 3 minutes     Blank cell = exercise not performed today                                    4/8 EXERCISE LOG  Exercise Repetitions and Resistance Comments  LAQ 2# x 2 minutes   Seated heel/toe raises  2# x 2 minutes   Cone taps  25 reps each  BUE support; 2 cones forward   Standing HS curl  25 reps each  BUE support  Static stance on foam  2 x 30 seconds BUE support   Seated hip ADD isometric  2 minutes w/ 5 second hold    Seated clams  Red t-band x 2 minutes    Nustep L3 x 4 minutes    Blank cell = exercise not  performed today                                    4/4 EXERCISE LOG  Exercise Repetitions and Resistance Comments  LAQ 2# x 2 minutes   Seated marching  3# x 2 minutes   Standing hip ABD  20 reps each    Standing heel raise  30 reps    Seated hip ADD isometric  2 minutes w/ 5 second hold     Blank cell = exercise not performed today   PATIENT EDUCATION: Education detailsNetwork engineer, plan  of care, prognosis, multiple sclerosis, and goals for therapy Person educated: Patient Education method: Explanation Education comprehension: verbalized understanding  HOME EXERCISE PROGRAM:   GOALS: Goals reviewed with patient? Yes  LONG TERM GOALS: Target date: 10/25/22  Patient will be independent with her HEP. Baseline:  Goal status: INITIAL  2.  Patient will improve her timed up and go time to 20 seconds or less for improved safety. Baseline:  Goal status: INITIAL  3.  Patient will be able to demonstrate left lower extremity strength equal to or greater than her right lower extremity. Baseline:  Goal status: INITIAL  ASSESSMENT:  CLINICAL IMPRESSION: Pt arrived today doing fair, but a little fatigued from planting some flowers this evening. Rx focused mainly on seated exs with one standing exercise performed. Pt did well again, but needs rest between exs and cueing not to over exert. Feeling okay end of session , but needed CGA/min assist out to car due to  RT foot toe drag and balance.  OBJECTIVE IMPAIRMENTS: Abnormal gait, decreased activity tolerance, decreased balance, decreased coordination, decreased endurance, decreased mobility, difficulty walking, decreased strength, impaired sensation, impaired tone, postural dysfunction, and pain.   ACTIVITY LIMITATIONS: carrying, lifting, bending, standing, squatting, stairs, transfers, locomotion level, and caring for others  PARTICIPATION LIMITATIONS: meal prep, cleaning, laundry, shopping, community activity, yard work, and  church  PERSONAL FACTORS: Behavior pattern, Past/current experiences, Time since onset of injury/illness/exacerbation, and 3+ comorbidities: Hypertension, COPD, multiple sclerosis, osteoporosis, chronic back pain, bipolar, depression, anxiety, and arthritis  are also affecting patient's functional outcome.   REHAB POTENTIAL: Fair    CLINICAL DECISION MAKING: Unstable/unpredictable  EVALUATION COMPLEXITY: High  PLAN:  PT FREQUENCY: 1-2x/week  PT DURATION: 3 weeks  PLANNED INTERVENTIONS: Therapeutic exercises, Therapeutic activity, Neuromuscular re-education, Balance training, Gait training, Patient/Family education, Self Care, Stair training, Cryotherapy, Moist heat, and Re-evaluation  PLAN FOR NEXT SESSION: Light lower extremity strengthening (monitor patient to prevent overexertion)   Kahmya Pinkham,CHRIS, PTA 10/10/2022, 1:43 PM OUTPATIENT PHYSICAL THERAPY NEURO TREATMENT   Patient Name: Angel Maddox MRN: [id number] DOB:03-31-1956, 67 y.o., female Today's Date: 10/10/2022   PCP: Kirstie Peri, MD REFERRING PROVIDER: Toni Arthurs, MD   END OF SESSION:  PT End of Session - 10/10/22 1121     Visit Number 5    Number of Visits 6    Date for PT Re-Evaluation 10/28/22    PT Start Time 1115    PT Stop Time 1155    PT Time Calculation (min) 40 min             Past Medical History:  Diagnosis Date   Anxiety    Arthritis    osteo   Atypical mole 08/10/2021   Right Abdomen (side) upper (severe)   Bipolar 1 disorder    Cervicalgia    Chronic kidney disease    kidney stone   COPD (chronic obstructive pulmonary disease)    new diagnosis- now sees pulmonologist   Depression    Deviated septum    Elevated liver enzymes    She says "normal now"   GERD (gastroesophageal reflux disease)    H/O hiatal hernia    Headache    Hip fracture 2017   left   History of kidney stones    4-5 yrs ago   Hypertension    Multiple sclerosis     Had for 15 years   Multiple  sclerosis    dx 1999   Tremors of nervous system    Past Surgical History:  Procedure Laterality Date   ANTERIOR CERVICAL DECOMP/DISCECTOMY FUSION N/A 07/28/2017   Procedure: CERVICAL FOUR-FIVE, CERVICAL FIVE-SIX ANTERIOR CERVICAL DECOMPRESSION/DISCECTOMY FUSION;  Surgeon: Tia Alert, MD;  Location: [facility] OR;  Service: Neurosurgery;  Laterality: N/A;   COLONOSCOPY  2008   Dr.Kaplan   EYE SURGERY     [facility]   HIP PINNING,CANNULATED Left 11/09/2015   Procedure: CANNULATED HIP PINNING;  Surgeon: Samson Frederic, MD;  Location: [facility];  Service: Orthopedics;  Laterality: Left;   NASAL SEPTOPLASTY W/ TURBINOPLASTY Bilateral 01/12/2021   Procedure: NASAL SEPTOPLASTY WITH BILATERAL  TURBINATE REDUCTION;  Surgeon: Dillard Pies, MD;  Location:  SURGERY CENTER;  Service: ENT;  Laterality: Bilateral;   TUBAL LIGATION     Patient Active Problem List   Diagnosis Date Noted   COVID-19 virus infection 03/20/2020   'Light-for-dates' infant with signs of fetal malnutrition 05/06/2019   Seizure-like activity 12/27/2018   Dyslipidemia 12/20/2018   Hyponatremia syndrome 07/04/2018   Dyspnea on exertion 07/03/2018   COPD mixed type 07/03/2018   MDD (major depressive disorder) 06/28/2018   GAD (generalized anxiety disorder) 06/28/2018   Major depressive disorder, recurrent episode, moderate 04/17/2018   Head injury 12/08/2017   Cervical vertebral fusion 07/28/2017   DDD (degenerative disc disease), cervical 07/10/2017   DDD (degenerative disc disease), thoracic 07/10/2017   DDD (degenerative disc disease), lumbar 07/10/2017   Facet arthritis of cervical region 07/10/2017   Abnormal liver function tests 03/07/2017   Hypokalemia 03/07/2017   Gastroesophageal reflux disease with esophagitis 03/07/2017   Esophageal dysphagia 03/07/2017   Chronic idiopathic constipation 02/01/2017   Fatigue 02/01/2017   HSV-1 infection 01/12/2017   Osteoporosis with pathological fracture,  with routine healing, subsequent encounter 08/30/2016   MDD (major depressive disorder), recurrent episode, severe 03/29/2016   Estrogen deficiency 12/15/2015   MS (multiple sclerosis) 12/15/2015   Tremor 12/15/2015   Healthcare maintenance 12/15/2015   Essential hypertension    Protein-calorie malnutrition, severe 11/09/2015   Bipolar 1 disorder, depressed 02/11/2015    ONSET DATE: 2-3 years ago  REFERRING DIAG: Foot drop, left foot   THERAPY DIAG:  Other abnormalities of gait and mobility  Unsteadiness on feet  Balance problem  Rationale for Evaluation and Treatment: Rehabilitation  SUBJECTIVE:                                                                                                                                                                                             SUBJECTIVE STATEMENT: Patient reports that she feels good today. Planted some flowers this morning  PERTINENT HISTORY: Hypertension, COPD, multiple sclerosis, osteoporosis, chronic back pain, bipolar,  depression, anxiety, and arthritis  PAIN:  Are you having pain? Yes: NPRS scale: 0/10 Pain location: left leg Pain description: aching and sharp pain in left hip and toes  PRECAUTIONS: Fall  WEIGHT BEARING RESTRICTIONS: No  FALLS: Has patient fallen in last 6 months? Yes. Number of falls at least once  LIVING ENVIRONMENT: Lives with: lives with their spouse Lives in: House/apartment Stairs: No Has following equipment at home: Environmental consultant - 4 wheeled and AFO  PLOF: Independent with basic ADLs  PATIENT GOALS: improved LLE strength  NEXT MD APPOINTMENT: 1 year unless needed   OBJECTIVE:   DIAGNOSTIC FINDINGS: 06/18/22 Brain MRI w/o contrast IMPRESSION: 1. Stable white matter burden compared to the previous exams. While some of this is consistent with the given diagnosis of multiple sclerosis, other portions are likely related to chronic microvascular ischemia including the right thalamic  lacunar infarct and changes in the brainstem. 2. No acute intracranial abnormality. COGNITION: Overall cognitive status: Within functional limits for tasks assessed   SENSATION: Patient reports global tingling when not taking her Gabapentin. Absent sensation to light touch in bilateral lower extremities   COORDINATION: Intention tremor observed with testing  EDEMA:  No edema observed  LOWER EXTREMITY ROM: WFL for activities assessed   LOWER EXTREMITY MMT:    MMT Right Eval Left Eval  Hip flexion 4-/5 3/5  Hip extension    Hip abduction    Hip adduction    Hip internal rotation    Hip external rotation    Knee flexion 4/5 4/5  Knee extension 4-/5 3+/5  Ankle dorsiflexion 3+/5 3/5  Ankle plantarflexion    Ankle inversion    Ankle eversion    (Blank rows = not tested)  TRANSFERS: Assistive device utilized: None  Sit to stand: SBA and Max A; patient required SBA initially, but required Max A at the conclusion of today's evaluation Stand to sit: SBA and Max A; patient required SBA initially, but required Max A at the conclusion of today's evaluation  GAIT: Gait pattern: step to pattern, decreased arm swing- Right, decreased arm swing- Left, decreased stride length, decreased hip/knee flexion- Right, decreased hip/knee flexion- Left, decreased ankle dorsiflexion- Right, decreased ankle dorsiflexion- Left, Right foot flat, Left foot flat, decreased trunk rotation, wide BOS, abducted- Right, poor foot clearance- Right, and poor foot clearance- Left Assistive device utilized: None Level of assistance: SBA and Total A Comments: Patient experienced a spastic attack as she was walking at the conclusion of today's evaluation and required total assistance from the treating physical therapist to prevent a fall  FUNCTIONAL TESTS:  Timed up and go (TUG): 33.26 seconds  TODAY'S TREATMENT:                                                                                                                               DATE:  4/15 EXERCISE LOG  Exercise Repetitions and Resistance Comments  Nustep  L1 x 8 minutes   Seated hip ADD isometric  3  minutes w/ 5 second hold    Standing heel/toe raise  3x10   LAQ 2# 3x10 reps each    Seated clams Red t-band 3x10    Blank cell = exercise not performed today                                    4/8 EXERCISE LOG  Exercise Repetitions and Resistance Comments  LAQ 2# x 2 minutes   Seated heel/toe raises  2# x 2 minutes   Cone taps  25 reps each  BUE support; 2 cones forward   Standing HS curl  25 reps each  BUE support  Static stance on foam  2 x 30 seconds BUE support   Seated hip ADD isometric  2 minutes w/ 5 second hold    Seated clams  Red t-band x 2 minutes    Nustep L3 x 4 minutes    Blank cell = exercise not performed today                                    4/4 EXERCISE LOG  Exercise Repetitions and Resistance Comments  LAQ 2# x 2 minutes   Seated marching  3# x 2 minutes   Standing hip ABD  20 reps each    Standing heel raise  30 reps    Seated hip ADD isometric  2 minutes w/ 5 second hold     Blank cell = exercise not performed today   PATIENT EDUCATION: Education details: Safety, plan of care, prognosis, multiple sclerosis, and goals for therapy Person educated: Patient Education method: Explanation Education comprehension: verbalized understanding  HOME EXERCISE PROGRAM:   GOALS: Goals reviewed with patient? Yes  LONG TERM GOALS: Target date: 10/25/22  Patient will be independent with her HEP. Baseline:  Goal status: INITIAL  2.  Patient will improve her timed up and go time to 20 seconds or less for improved safety. Baseline:  Goal status: INITIAL  3.  Patient will be able to demonstrate left lower extremity strength equal to or greater than her right lower extremity. Baseline:  Goal status: INITIAL  ASSESSMENT:  CLINICAL IMPRESSION: Treatment focused on  familiar interventions for improved lower extremity strength and endurance. Fatigue was her primary limitation with today's interventions as seated hamstring curls was the most difficult. She was educated on safe exercise habits to avoid overexertion. She reported feeling a little tired upon the conclusion of treatment. She continues to require skilled physical therapy to address her remaining impairments to maximize her safety and functional mobility.   OBJECTIVE IMPAIRMENTS: Abnormal gait, decreased activity tolerance, decreased balance, decreased coordination, decreased endurance, decreased mobility, difficulty walking, decreased strength, impaired sensation, impaired tone, postural dysfunction, and pain.   ACTIVITY LIMITATIONS: carrying, lifting, bending, standing, squatting, stairs, transfers, locomotion level, and caring for others  PARTICIPATION LIMITATIONS: meal prep, cleaning, laundry, shopping, community activity, yard work, and church  PERSONAL FACTORS: Behavior pattern, Past/current experiences, Time since onset of injury/illness/exacerbation, and 3+ comorbidities: Hypertension, COPD, multiple sclerosis, osteoporosis, chronic back pain, bipolar, depression, anxiety, and arthritis  are also affecting patient's functional outcome.   REHAB POTENTIAL: Fair    CLINICAL DECISION MAKING: Unstable/unpredictable  EVALUATION COMPLEXITY:  High  PLAN:  PT FREQUENCY: 1-2x/week  PT DURATION: 3 weeks  PLANNED INTERVENTIONS: Therapeutic exercises, Therapeutic activity, Neuromuscular re-education, Balance training, Gait training, Patient/Family education, Self Care, Stair training, Cryotherapy, Moist heat, and Re-evaluation  PLAN FOR NEXT SESSION: Light lower extremity strengthening (monitor patient to prevent overexertion)   Maurice ,CHRIS, PTA 10/10/2022, 1:43 PM

## 2022-10-11 DIAGNOSIS — I10 Essential (primary) hypertension: Secondary | ICD-10-CM | POA: Diagnosis not present

## 2022-10-13 ENCOUNTER — Ambulatory Visit: Payer: Medicare PPO

## 2022-10-13 DIAGNOSIS — R2681 Unsteadiness on feet: Secondary | ICD-10-CM

## 2022-10-13 DIAGNOSIS — R2689 Other abnormalities of gait and mobility: Secondary | ICD-10-CM | POA: Diagnosis not present

## 2022-10-13 NOTE — Therapy (Addendum)
OUTPATIENT PHYSICAL THERAPY NEURO TREATMENT   Patient Name: Angel Maddox MRN: [id number] DOB:[birth year], 67 y.o., female Today's Date: 10/13/2022   PCP: Kirstie Peri, MD REFERRING PROVIDER: Toni Arthurs, MD   END OF SESSION:  PT End of Session - 10/13/22 1314     Visit Number 6    Number of Visits 6    Date for PT Re-Evaluation 10/28/22    PT Start Time 1300    PT Stop Time 1348    PT Time Calculation (min) 48 min    Activity Tolerance Patient tolerated treatment well    Behavior During Therapy [facility] for tasks assessed/performed             Past Medical History:  Diagnosis Date   Anxiety    Arthritis    osteo   Atypical mole 08/10/2021   Right Abdomen (side) upper (severe)   Bipolar 1 disorder    Cervicalgia    Chronic kidney disease    kidney stone   COPD (chronic obstructive pulmonary disease)    new diagnosis- now sees pulmonologist   Depression    Deviated septum    Elevated liver enzymes    She says "normal now"   GERD (gastroesophageal reflux disease)    H/O hiatal hernia    Headache    Hip fracture 2017   left   History of kidney stones    4-5 yrs ago   Hypertension    Multiple sclerosis     Had for 15 years   Multiple sclerosis    dx 1999   Tremors of nervous system    Past Surgical History:  Procedure Laterality Date   ANTERIOR CERVICAL DECOMP/DISCECTOMY FUSION N/A 07/28/2017   Procedure: CERVICAL FOUR-FIVE, CERVICAL FIVE-SIX ANTERIOR CERVICAL DECOMPRESSION/DISCECTOMY FUSION;  Surgeon: Tia Alert, MD;  Location: [facility] OR;  Service: Neurosurgery;  Laterality: N/A;   COLONOSCOPY  2008   Dr.Kaplan   EYE SURGERY     [facility]   HIP PINNING,CANNULATED Left 11/09/2015   Procedure: CANNULATED HIP PINNING;  Surgeon: Samson Frederic, MD;  Location: [facility];  Service: Orthopedics;  Laterality: Left;   NASAL SEPTOPLASTY W/ TURBINOPLASTY Bilateral 01/12/2021   Procedure: NASAL SEPTOPLASTY WITH BILATERAL  TURBINATE REDUCTION;   Surgeon: Mansfield Pies, MD;  Location: [facility];  Service: ENT;  Laterality: Bilateral;   TUBAL LIGATION     Patient Active Problem List   Diagnosis Date Noted   COVID-19 virus infection 03/20/2020   'Light-for-dates' infant with signs of fetal malnutrition 05/06/2019   Seizure-like activity 12/27/2018   Dyslipidemia 12/20/2018   Hyponatremia syndrome 07/04/2018   Dyspnea on exertion 07/03/2018   COPD mixed type 07/03/2018   MDD (major depressive disorder) 06/28/2018   GAD (generalized anxiety disorder) 06/28/2018   Major depressive disorder, recurrent episode, moderate 04/17/2018   Head injury 12/08/2017   Cervical vertebral fusion 07/28/2017   DDD (degenerative disc disease), cervical 07/10/2017   DDD (degenerative disc disease), thoracic 07/10/2017   DDD (degenerative disc disease), lumbar 07/10/2017   Facet arthritis of cervical region 07/10/2017   Abnormal liver function tests 03/07/2017   Hypokalemia 03/07/2017   Gastroesophageal reflux disease with esophagitis 03/07/2017   Esophageal dysphagia 03/07/2017   Chronic idiopathic constipation 02/01/2017   Fatigue 02/01/2017   HSV-1 infection 01/12/2017   Osteoporosis with pathological fracture, with routine healing, subsequent encounter 08/30/2016   MDD (major depressive disorder), recurrent episode, severe 03/29/2016   Estrogen deficiency 12/15/2015   MS (multiple sclerosis) 12/15/2015  Tremor 12/15/2015   Healthcare maintenance 12/15/2015   Essential hypertension    Protein-calorie malnutrition, severe 11/09/2015   Bipolar 1 disorder, depressed 02/11/2015    ONSET DATE: 2-3 years ago  REFERRING DIAG: Foot drop, left foot   THERAPY DIAG:  Other abnormalities of gait and mobility  Unsteadiness on feet  Rationale for Evaluation and Treatment: Rehabilitation  SUBJECTIVE:                                                                                                                                                                                              SUBJECTIVE STATEMENT: Patient reports that she feels alright today. She notes that she tried to exercise after her last appointment, but her right leg started dragging after one exercise. However, she felt fine yesterday as she was able to exercise yesterday and she felt fine.  She feels that her left leg is still about the same since she started therapy.   PERTINENT HISTORY: Hypertension, COPD, multiple sclerosis, osteoporosis, chronic back pain, bipolar, depression, anxiety, and arthritis  PAIN:  Are you having pain? Yes: NPRS scale: 0/10 Pain location: left leg Pain description: aching and sharp pain in left hip and toes  PRECAUTIONS: Fall  WEIGHT BEARING RESTRICTIONS: No  FALLS: Has patient fallen in last 6 months? Yes. Number of falls at least once  LIVING ENVIRONMENT: Lives with: lives with their spouse Lives in: House/apartment Stairs: No Has following equipment at home: Environmental consultant - 4 wheeled and AFO  PLOF: Independent with basic ADLs  PATIENT GOALS: improved LLE strength  NEXT MD APPOINTMENT: 1 year unless needed   OBJECTIVE:   DIAGNOSTIC FINDINGS: 06/18/22 Brain MRI w/o contrast IMPRESSION: 1. Stable white matter burden compared to the previous exams. While some of this is consistent with the given diagnosis of multiple sclerosis, other portions are likely related to chronic microvascular ischemia including the right thalamic lacunar infarct and changes in the brainstem. 2. No acute intracranial abnormality. COGNITION: Overall cognitive status: Within functional limits for tasks assessed   SENSATION: Patient reports global tingling when not taking her Gabapentin. Absent sensation to light touch in bilateral lower extremities   COORDINATION: Intention tremor observed with testing  EDEMA:  No edema observed  LOWER EXTREMITY ROM: WFL for activities assessed   LOWER EXTREMITY MMT:    MMT Right Eval Right  10/13/22 Left Eval Left 10/13/22  Hip flexion 4-/5 4/5 3/5 3+/5  Hip extension      Hip abduction      Hip adduction      Hip internal rotation      Hip external rotation  Knee flexion 4/5 4/5 4/5 4-/5  Knee extension 4-/5 4/5 3+/5 3+/5  Ankle dorsiflexion 3+/5 3+/5 3/5 3/5  Ankle plantarflexion      Ankle inversion      Ankle eversion      (Blank rows = not tested)  TRANSFERS: Assistive device utilized: None  Sit to stand: SBA and Max A; patient required SBA initially, but required Max A at the conclusion of today's evaluation Stand to sit: SBA and Max A; patient required SBA initially, but required Max A at the conclusion of today's evaluation  GAIT: Gait pattern: step to pattern, decreased arm swing- Right, decreased arm swing- Left, decreased stride length, decreased hip/knee flexion- Right, decreased hip/knee flexion- Left, decreased ankle dorsiflexion- Right, decreased ankle dorsiflexion- Left, Right foot flat, Left foot flat, decreased trunk rotation, wide BOS, abducted- Right, poor foot clearance- Right, and poor foot clearance- Left Assistive device utilized: None Level of assistance: SBA and Total A Comments: Patient experienced a spastic attack as she was walking at the conclusion of today's evaluation and required total assistance from the treating physical therapist to prevent a fall  FUNCTIONAL TESTS:  Timed up and go (TUG): 33.26 seconds  TODAY'S TREATMENT:                                                                                                                              DATE:                                     4/18 EXERCISE LOG  Exercise Repetitions and Resistance Comments  Nustep  L1 x 9 minutes   Seated heel/toe raises 3 minutes    Ankle circles  10 reps each    LAQ 15 reps each        Blank cell = exercise not performed today                                    4/15 EXERCISE LOG  Exercise Repetitions and Resistance Comments  Nustep  L1 x 8  minutes   Seated hip ADD isometric  3  minutes w/ 5 second hold    Standing heel/toe raise  3x10   LAQ 2# 3x10 reps each    Seated clams Red t-band 3x10    Blank cell = exercise not performed today                                    4/8 EXERCISE LOG  Exercise Repetitions and Resistance Comments  LAQ 2# x 2 minutes   Seated heel/toe raises  2# x 2 minutes   Cone taps  25 reps each  BUE support; 2 cones forward   Standing HS curl  25 reps each  BUE support  Static stance on foam  2 x 30 seconds BUE support   Seated hip ADD isometric  2 minutes w/ 5 second hold    Seated clams  Red t-band x 2 minutes    Nustep L3 x 4 minutes    Blank cell = exercise not performed today   PATIENT EDUCATION: Education details: progress with therapy, wearing an AFO, contacting her physician, and the importance of continued exercise outside of therapy Person educated: Patient Education method: Explanation Education comprehension: verbalized understanding  HOME EXERCISE PROGRAM:   GOALS: Goals reviewed with patient? Yes  LONG TERM GOALS: Target date: 10/25/22  Patient will be independent with her HEP. Baseline: patient reports that she is not doing any exercises at home Goal status: NOT MET  2.  Patient will improve her timed up and go time to 20 seconds or less for improved safety. Baseline: 17 seconds on 10/13/22 Goal status: MET  3.  Patient will be able to demonstrate left lower extremity strength equal to or greater than her right lower extremity. Baseline: see objective measures Goal status: IN PROGRESS  ASSESSMENT:  CLINICAL IMPRESSION: Patient is making fair progress with skilled physical therapy as evidenced by her subjective reports, objective measures, functional mobility, and progress toward her goals. She was able to meet her goal for an improved timed up and go time. However, she was unable to meet her other goals for therapy due to continued muscular weakness. She continues to  exhibit gait deviations as she gets fatigued. She was educated on her progress with therapy and the benefits of continued exercise outside of therapy. Her HEP was reviewed and she was able to properly demonstrate these interventions. She reported feeling comfortable with these interventions. She reported feeling alright upon the conclusion of treatment. She continues to require skilled physical therapy to address her remaining impairments to maximize her safety and functional mobility.   PHYSICAL THERAPY DISCHARGE SUMMARY  Visits from Start of Care: 6  Current functional level related to goals / functional outcomes: Patient was able to partially meet her goals for skilled physical therapy.    Remaining deficits: Muscular strength and gait deviations   Education / Equipment: HEP    Patient agrees to discharge. Patient goals were partially met. Patient is being discharged due to maximized rehab potential.   Candi Leash, PT, DPT    OBJECTIVE IMPAIRMENTS: Abnormal gait, decreased activity tolerance, decreased balance, decreased coordination, decreased endurance, decreased mobility, difficulty walking, decreased strength, impaired sensation, impaired tone, postural dysfunction, and pain.   ACTIVITY LIMITATIONS: carrying, lifting, bending, standing, squatting, stairs, transfers, locomotion level, and caring for others  PARTICIPATION LIMITATIONS: meal prep, cleaning, laundry, shopping, community activity, yard work, and church  PERSONAL FACTORS: Behavior pattern, Past/current experiences, Time since onset of injury/illness/exacerbation, and 3+ comorbidities: Hypertension, COPD, multiple sclerosis, osteoporosis, chronic back pain, bipolar, depression, anxiety, and arthritis  are also affecting patient's functional outcome.   REHAB POTENTIAL: Fair    CLINICAL DECISION MAKING: Unstable/unpredictable  EVALUATION COMPLEXITY: High  PLAN:  PT FREQUENCY: 1-2x/week  PT DURATION: 3  weeks  PLANNED INTERVENTIONS: Therapeutic exercises, Therapeutic activity, Neuromuscular re-education, Balance training, Gait training, Patient/Family education, Self Care, Stair training, Cryotherapy, Moist heat, and Re-evaluation  PLAN FOR NEXT SESSION: Light lower extremity strengthening (monitor patient to prevent overexertion)   Granville Lewis, PT 10/13/2022, 6:39 PM

## 2022-10-21 ENCOUNTER — Ambulatory Visit
Admission: RE | Admit: 2022-10-21 | Discharge: 2022-10-21 | Disposition: A | Discharge status: Home / Self Care | Payer: Medicare PPO | Source: Ambulatory Visit | Attending: Nurse Practitioner | Admitting: Nurse Practitioner

## 2022-10-21 DIAGNOSIS — R531 Weakness: Secondary | ICD-10-CM | POA: Diagnosis not present

## 2022-10-21 DIAGNOSIS — G35 Multiple sclerosis: Secondary | ICD-10-CM

## 2022-10-21 DIAGNOSIS — R296 Repeated falls: Secondary | ICD-10-CM

## 2022-10-21 DIAGNOSIS — R200 Anesthesia of skin: Secondary | ICD-10-CM | POA: Diagnosis not present

## 2022-10-21 DIAGNOSIS — M21372 Foot drop, left foot: Secondary | ICD-10-CM | POA: Diagnosis not present

## 2022-10-21 DIAGNOSIS — R52 Pain, unspecified: Secondary | ICD-10-CM | POA: Diagnosis not present

## 2022-10-21 DIAGNOSIS — N200 Calculus of kidney: Secondary | ICD-10-CM | POA: Diagnosis not present

## 2022-10-21 MED ORDER — GADOPICLENOL 0.5 MMOL/ML IV SOLN
6.0000 mL | Freq: Once | INTRAVENOUS | Status: AC | PRN
  Administered 2022-10-21: 6 mL via INTRAVENOUS

## 2022-10-24 ENCOUNTER — Ambulatory Visit
Admission: RE | Admit: 2022-10-24 | Discharge: 2022-10-24 | Disposition: A | Discharge status: Home / Self Care | Payer: Medicare PPO | Source: Ambulatory Visit | Attending: Nurse Practitioner | Admitting: Nurse Practitioner

## 2022-10-24 DIAGNOSIS — M5459 Other low back pain: Secondary | ICD-10-CM | POA: Diagnosis not present

## 2022-10-24 DIAGNOSIS — R0982 Postnasal drip: Secondary | ICD-10-CM | POA: Diagnosis not present

## 2022-10-24 DIAGNOSIS — M545 Low back pain, unspecified: Secondary | ICD-10-CM

## 2022-10-24 DIAGNOSIS — G35 Multiple sclerosis: Secondary | ICD-10-CM | POA: Diagnosis not present

## 2022-10-24 DIAGNOSIS — J310 Chronic rhinitis: Secondary | ICD-10-CM | POA: Diagnosis not present

## 2022-10-24 MED ORDER — GADOPICLENOL 0.5 MMOL/ML IV SOLN
5.5000 mL | Freq: Once | INTRAVENOUS | Status: AC | PRN
  Administered 2022-10-24: 5.5 mL via INTRAVENOUS

## 2022-10-29 ENCOUNTER — Other Ambulatory Visit: Payer: Self-pay | Admitting: Physician Assistant

## 2022-11-02 ENCOUNTER — Other Ambulatory Visit: Payer: Self-pay | Admitting: Physician Assistant

## 2022-11-14 NOTE — Progress Notes (Unsigned)
[facility]  PATIENT: Angel Maddox DOB: 1956-05-28  REFERRING DOCTOR OR PCP: Jethro Bolus, FNP [facility] neurology; Eduard Clos (PCP) SOURCE: Patient, note from [facility] neurology, imaging and lab reports, MRI images personally reviewed.  _________________________________   HISTORICAL  CHIEF COMPLAINT:  No chief complaint on file.   HISTORY OF PRESENT ILLNESS:  I had the pleasure to see your patient, Angel Maddox, at [facility] for neurologic consultation regarding her abnormal brain MRI and past diagnosis of multiple sclerosis.  She is a 67 year old woman    Imaging: MRI of the brain 10/21/2022 and 06/18/2022 and 10/09/2019 show multiple T2/FLAIR hyperintense foci in the cerebral hemispheres, predominantly in the deep white matter and subcortical white matter.  There are no septal callosal lesions.  No juxtacortical lesions.  Foci are noted in the pons in a somewhat symmetric matter.  There is a chronic lacunar infarction in the posterior right thalamus.  There is an expanded Virchow-Robin space adjacent to the basal ganglia on the left.  No change between the 2021, 2023 and 2024 MRI.  No enhancing lesions.  MRI of the cervical spine 06/18/2022 shows a normal spinal cord.  There is ACDF at C4-C6.  There is no spinal stenosis.  At C2-C3 there is right foraminal narrowing mostly due to right facet hypertrophy.  At C3-C4, there are disc osteophyte complexes causing bilateral moderate foraminal narrowing without definite nerve root compression.  At C6-C7, there is a right disc osteophyte complex causing foraminal narrowing.   REVIEW OF SYSTEMS: Constitutional: No fevers, chills, sweats, or change in appetite Eyes: No visual changes, double vision, eye pain Ear, nose and throat: No hearing loss, ear pain, nasal congestion, sore throat Cardiovascular: No chest pain, palpitations Respiratory:  No shortness of breath at rest or with  exertion.   No wheezes GastrointestinaI: No nausea, vomiting, diarrhea, abdominal pain, fecal incontinence Genitourinary:  No dysuria, urinary retention or frequency.  No nocturia. Musculoskeletal:  No neck pain, back pain Integumentary: No rash, pruritus, skin lesions Neurological: as above Psychiatric: No depression at this time.  No anxiety Endocrine: No palpitations, diaphoresis, change in appetite, change in weigh or increased thirst Hematologic/Lymphatic:  No anemia, purpura, petechiae. Allergic/Immunologic: No itchy/runny eyes, nasal congestion, recent allergic reactions, rashes  ALLERGIES: Allergies  Allergen Reactions   Hydrocodone Itching    HOME MEDICATIONS:  Current Outpatient Medications:    amLODipine (NORVASC) 10 MG tablet, Take 1 tablet (10 mg total) by mouth daily., Disp: 90 tablet, Rfl: 1   baclofen (LIORESAL) 10 MG tablet, Take 10 mg by mouth 3 (three) times daily., Disp: , Rfl:    buPROPion (WELLBUTRIN SR) 200 MG 12 hr tablet, TAKE 1 TABLET BY MOUTH TWICE A DAY, Disp: 60 tablet, Rfl: 1   citalopram (CELEXA) 40 MG tablet, Take 1 tablet (40 mg total) by mouth daily., Disp: 30 tablet, Rfl: 5   clonazePAM (KLONOPIN) 1 MG disintegrating tablet, 1 mg at bedtime., Disp: , Rfl:    dalfampridine 10 MG TB12, 2 (two) times daily., Disp: , Rfl:    dicyclomine (BENTYL) 10 MG capsule, TAKE 1 CAPSULE (10 MG TOTAL) BY MOUTH EVERY 8 (EIGHT) HOURS AS NEEDED FOR SPASMS., Disp: 30 capsule, Rfl: 1   gabapentin (NEURONTIN) 300 MG capsule, Take 2 capsules (600 mg total) by mouth 3 (three) times daily., Disp: 180 capsule, Rfl: 0   losartan (COZAAR) 25 MG tablet, TAKE 1 TABLET BY MOUTH EVERY DAY, Disp: 90 tablet, Rfl: 1   mirtazapine (REMERON)  15 MG tablet, TAKE 0.5-1 TABLETS (7.5-15 MG TOTAL) BY MOUTH AT BEDTIME AS NEEDED., Disp: 90 tablet, Rfl: 0   mupirocin ointment (BACTROBAN) 2 %, Apply 1 application topically 2 (two) times daily., Disp: 60 g, Rfl: 0   omeprazole (PRILOSEC) 40 MG  capsule, Take 1 capsule (40 mg total) by mouth daily. Please schedule a yearly follow up for further refills. Thank you, Disp: 90 capsule, Rfl: 0   ondansetron (ZOFRAN-ODT) 4 MG disintegrating tablet, TAKE 1 TABLET BY MOUTH EVERY 8 HOURS AS NEEDED FOR NAUSEA AND VOMITING (Patient not taking: Reported on 02/15/2022), Disp: 30 tablet, Rfl: 2   Oxycodone HCl 10 MG TABS, Take 10 mg by mouth 3 (three) times daily as needed., Disp: , Rfl:    primidone (MYSOLINE) 250 MG tablet, Take 250 mg by mouth 3 (three) times daily., Disp: , Rfl:    psyllium (METAMUCIL) 58.6 % powder, Take 1 packet by mouth daily., Disp: , Rfl:    riTUXimab (RITUXAN) 100 MG/10ML injection, Inject into the vein., Disp: , Rfl:    tiZANidine (ZANAFLEX) 4 MG capsule, , Disp: , Rfl:   PAST MEDICAL HISTORY: Past Medical History:  Diagnosis Date   Anxiety    Arthritis    osteo   Atypical mole 08/10/2021   Right Abdomen (side) upper (severe)   Bipolar 1 disorder (HCC)    Cervicalgia    Chronic kidney disease    kidney stone   COPD (chronic obstructive pulmonary disease) (HCC)    new diagnosis- now sees pulmonologist   Depression    Deviated septum    Elevated liver enzymes    She says "normal now"   GERD (gastroesophageal reflux disease)    H/O hiatal hernia    Headache    Hip fracture (HCC) 2017   left   History of kidney stones    4-5 yrs ago   Hypertension    Multiple sclerosis (HCC)     Had for 15 years   Multiple sclerosis (HCC)    dx 1999   Tremors of nervous system     PAST SURGICAL HISTORY: Past Surgical History:  Procedure Laterality Date   ANTERIOR CERVICAL DECOMP/DISCECTOMY FUSION N/A 07/28/2017   Procedure: CERVICAL FOUR-FIVE, CERVICAL FIVE-SIX ANTERIOR CERVICAL DECOMPRESSION/DISCECTOMY FUSION;  Surgeon: Tia Alert, MD;  Location: [facility] OR;  Service: Neurosurgery;  Laterality: N/A;   COLONOSCOPY  2008   Dr.Kaplan   EYE SURGERY     [facility]   HIP PINNING,CANNULATED Left 11/09/2015    Procedure: CANNULATED HIP PINNING;  Surgeon: Samson Frederic, MD;  Location: [facility];  Service: Orthopedics;  Laterality: Left;   NASAL SEPTOPLASTY W/ TURBINOPLASTY Bilateral 01/12/2021   Procedure: NASAL SEPTOPLASTY WITH BILATERAL  TURBINATE REDUCTION;  Surgeon: Hardage Pies, MD;  Location: [facility];  Service: ENT;  Laterality: Bilateral;   TUBAL LIGATION      FAMILY HISTORY: Family History  Problem Relation Age of Onset   Heart attack Mother    Breast cancer Maternal Aunt    Colon cancer Neg Hx    Stomach cancer Neg Hx    Esophageal cancer Neg Hx    Rectal cancer Neg Hx     SOCIAL HISTORY: Social History   Socioeconomic History   Marital status: Married    Spouse name: Not on file   Number of children: Not on file   Years of education: Not on file   Highest education level: Not on file  Occupational History   Not on  file  Tobacco Use   Smoking status: Former    Packs/day: 0.25    Years: 15.00    Additional pack years: 0.00    Total pack years: 3.75    Types: Cigarettes    Quit date: 01/25/1991    Years since quitting: 31.8   Smokeless tobacco: Never   Tobacco comments:    NO SMOKING at All  Vaping Use   Vaping Use: Never used  Substance and Sexual Activity   Alcohol use: No   Drug use: No   Sexual activity: Yes  Other Topics Concern   Not on file  Social History Narrative   Not on file   Social Determinants of Health   Financial Resource Strain: Not on file  Food Insecurity: Not on file  Transportation Needs: Not on file  Physical Activity: Not on file  Stress: Not on file  Social Connections: Not on file  Intimate Partner Violence: Not on file       PHYSICAL EXAM  There were no vitals filed for this visit.  There is no height or weight on file to calculate BMI.   General: The patient is well-developed and well-nourished and in no acute distress  HEENT:  Head is /AT.  Sclera are anicteric.  Funduscopic exam shows normal optic  discs and retinal vessels.  Neck: No carotid bruits are noted.  The neck is nontender.  Cardiovascular: The heart has a regular rate and rhythm with a normal S1 and S2. There were no murmurs, gallops or rubs.    Skin: Extremities are without rash or  edema.  Musculoskeletal:  Back is nontender  Neurologic Exam  Mental status: The patient is alert and oriented x 3 at the time of the examination. The patient has apparent normal recent and remote memory, with an apparently normal attention span and concentration ability.   Speech is normal.  Cranial nerves: Extraocular movements are full. Pupils are equal, round, and reactive to light and accomodation.  Visual fields are full.  Facial symmetry is present. There is good facial sensation to soft touch bilaterally.Facial strength is normal.  Trapezius and sternocleidomastoid strength is normal. No dysarthria is noted.  The tongue is midline, and the patient has symmetric elevation of the soft palate. No obvious hearing deficits are noted.  Motor:  Muscle bulk is normal.   Tone is normal. Strength is  5 / 5 in all 4 extremities.   Sensory: Sensory testing is intact to pinprick, soft touch and vibration sensation in all 4 extremities.  Coordination: Cerebellar testing reveals good finger-nose-finger and heel-to-shin bilaterally.  Gait and station: Station is normal.   Gait is normal. Tandem gait is normal. Romberg is negative.   Reflexes: Deep tendon reflexes are symmetric and normal bilaterally.   Plantar responses are flexor.    DIAGNOSTIC DATA (LABS, IMAGING, TESTING) - I reviewed patient records, labs, notes, testing and imaging myself where available.  Lab Results  Component Value Date   WBC 4.7 12/28/2018   HGB 13.0 12/28/2018   HCT 38.8 12/28/2018   MCV 98.0 12/28/2018   PLT 308 12/28/2018      Component Value Date/Time   NA 138 07/22/2019 1505   K 4.5 07/22/2019 1505   CL 98 07/22/2019 1505   CO2 25 07/22/2019 1505    GLUCOSE 69 07/22/2019 1505   GLUCOSE 61 (L) 12/28/2018 0402   BUN 9 07/22/2019 1505   CREATININE 1.00 07/22/2019 1505   CALCIUM 8.8 07/22/2019 1505   PROT  7.0 02/04/2019 1426   PROT 7.0 10/16/2018 1225   ALBUMIN 4.4 02/04/2019 1426   ALBUMIN 4.6 10/16/2018 1225   AST 30 02/04/2019 1426   ALT 19 02/04/2019 1426   ALKPHOS 153 (H) 02/04/2019 1426   BILITOT 0.2 02/04/2019 1426   BILITOT <0.2 10/16/2018 1225   GFRNONAA 60 07/22/2019 1505   GFRAA 69 07/22/2019 1505   Lab Results  Component Value Date   CHOL 210 (H) 07/22/2019   HDL 111 07/22/2019   LDLCALC 85 07/22/2019   LDLDIRECT 123.6 07/24/2006   TRIG 82 07/22/2019   CHOLHDL 1.9 07/22/2019   Lab Results  Component Value Date   HGBA1C 5.2 12/20/2018   Lab Results  Component Value Date   VITAMINB12 574 11/09/2015   Lab Results  Component Value Date   TSH 0.861 12/19/2018       ASSESSMENT AND PLAN  ***   Caily Rakers A. Epimenio Foot, MD, [facility] 11/14/2022, 8:37 PM Certified in Neurology, Clinical Neurophysiology, Sleep Medicine and Neuroimaging  [facility] [address], [address] [place], [place] [address] [phone]

## 2022-11-17 ENCOUNTER — Ambulatory Visit (INDEPENDENT_AMBULATORY_CARE_PROVIDER_SITE_OTHER): Payer: Medicare PPO | Admitting: Neurology

## 2022-11-17 ENCOUNTER — Encounter: Payer: Self-pay | Admitting: Neurology

## 2022-11-17 VITALS — BP 147/91 | HR 63 | Ht 66.0 in | Wt 133.0 lb

## 2022-11-17 DIAGNOSIS — F444 Conversion disorder with motor symptom or deficit: Secondary | ICD-10-CM

## 2022-11-17 DIAGNOSIS — F418 Other specified anxiety disorders: Secondary | ICD-10-CM | POA: Diagnosis not present

## 2022-11-17 DIAGNOSIS — R9082 White matter disease, unspecified: Secondary | ICD-10-CM

## 2022-11-17 DIAGNOSIS — M4322 Fusion of spine, cervical region: Secondary | ICD-10-CM

## 2022-11-17 DIAGNOSIS — R269 Unspecified abnormalities of gait and mobility: Secondary | ICD-10-CM

## 2022-11-17 DIAGNOSIS — R208 Other disturbances of skin sensation: Secondary | ICD-10-CM

## 2022-11-17 MED ORDER — LAMOTRIGINE 100 MG PO TABS: 100.0000 mg | ORAL_TABLET | Freq: Two times a day (BID) | ORAL | 5 refills | Status: DC

## 2022-11-17 NOTE — Patient Instructions (Addendum)
The pharmacy has the prescription for lamotrigine 100 mg tablets. For 5 days, just take one half pill a day. For the next 5 days, take one half pill twice a day. For the next 5 days, take one half pill 3 times a day Then start taking one pill twice a day from this point on.       If you get a rash, need to stop the medication and not take it again.    Cut the Primidone down to 125 mg twice a day and then one pill at night If doing well can cu down to 1/2 pill three times a day.

## 2022-11-28 ENCOUNTER — Other Ambulatory Visit: Payer: Self-pay | Admitting: Physician Assistant

## 2022-11-28 ENCOUNTER — Telehealth: Payer: Self-pay | Admitting: Neurology

## 2022-11-28 NOTE — Telephone Encounter (Signed)
Pt called stating that she was informed that since she has started to see Dr. Epimenio Foot that he is the one that should be filling her Oxycodone HCl 10 MG TABS  Please advise.

## 2022-11-28 NOTE — Telephone Encounter (Signed)
Called pt back and she stated that Dr. Epimenio Foot didn't let her finish telling him everything that is going on with her. Pt states that she was getting her Oxycodone prescribed by another doctor for her DDD and thought that since she is now seeing Dr. Epimenio Foot that he is going to take over filling her prescription for Oxycodone. Told pt we will let Dr. Epimenio Foot know and see what he wants to do about this manner and we will call her back. Pt verbalized understanding.

## 2022-11-28 NOTE — Telephone Encounter (Signed)
Dr. Epimenio Foot said that he is not going to prescribe Oxycodone to pt and that pt can get 2nd opinion and we can send referral to [facility]. Will Call pt back in the morning.

## 2022-11-29 NOTE — Telephone Encounter (Addendum)
Called pt and let her know that Dr. Epimenio Foot isn't going to fill her script for Oxycodone. Pt stated that she thought he wouldn't. Pt then stated that she is still not believing that she doesn't have MS. Asked pt if she would like a 2nd opinion and a referral to [facility] per Dr. Epimenio Foot, she stated that she doesn't want to hop around different doctors. Pt then stated she has all the symptoms of MS including the numbness in her feet. Asked pt if she had seen anybody about that and she stated that she had and they said it could be from anything neurological, cervical, or she could had possible had a stroke. Asked pt had she looked more into this and she stated that Dr. Leotis Shames told her she had MS. Reiterated to pt her last visit and ensured pt that we will look into everything and see what is going on with her. Pt insisted that Dr. Leotis Shames told her that she has MS and she felt as though Dr. Epimenio Foot didn't listen to her and everything that is going on with her and that she isn't making up her symptoms, ensured pt that we know she's not making up her symptoms and that her symptoms are very much real and if she will work with [place] we will do everything we can to find her an appropriate diagnosis and that she has to be willing to have an open mind and be will to accept a different opinion from what Dr. Leotis Shames told her, pt said that she would be willing to accept all information that she can receive from [place]. Told pt we will reach out to Dr. Epimenio Foot about her concerns and call her back with more information. Pt verbalized understanding.

## 2022-12-11 ENCOUNTER — Other Ambulatory Visit: Payer: Self-pay | Admitting: Gastroenterology

## 2022-12-11 DIAGNOSIS — R1013 Epigastric pain: Secondary | ICD-10-CM

## 2022-12-19 ENCOUNTER — Telehealth: Payer: Self-pay | Admitting: Neurology

## 2022-12-19 NOTE — Telephone Encounter (Signed)
Pt stated she needs to talk to nurse about lamoTRIgine (LAMICTAL) 100 MG tablet. Stated medication is making her tremors worst, stated the medication also interrupt her sleeping. Pt stated she has stopped taking medication because of side effects.

## 2022-12-19 NOTE — Telephone Encounter (Signed)
Pt last saw Dr. Epimenio Foot 11/17/22. She was placed on lamotrigine for dysesthesias/mood. Dr. Epimenio Foot provided the following titration instructions to work up to maintenance dose:  "Lamotrigine 100 mg tablets. For 5 days, just take one half pill a day. For the next 5 days, take one half pill twice a day. For the next 5 days, take one half pill 3 times a day Then start taking one pill twice a day from this point on.      If you get a rash, need to stop the medication and not take it again. "   Called pt to further discuss. She took the lamotrigine titration as instructed and then stopped med. Felt tremors worse on med/caused insomnia. Only sleeping 2-3 hr per night. Always gets 8-10 hr a night typically. Caused constipation/stomach issues. Was not aware it was generic of Lamictal. Been on Lamictal yr ago for misdiagnosis of Bipolar disorder. Could not tolerate back then either. Sx improved after stopping lamotrigine.   Last Monday, sitting in recliner, weakness from toes to fingers on left side worsened and had tingling. Not able to move around on L leg that day. She had to hold onto something to move around. Back to taking primidone 250mg  po BID last week, felt 125mg  po BID ineffective. Also taking gabapentin 300mg , 2 caps po TID. Typically by bedtime, lips still tingling/tongue burning/hands burning/tingling. She got gabapentin refilled yesterday and back on full dose now (had lowered dose d/t running low on med for a week or so).  I offered again to place referral to [facility] for another opinion. Pt declined, wants to give Dr. Epimenio Foot a chance. I did reiterate that he does not feel she has MS. Wants to tell him the history pior to following with Dr. Tinnie Gens. States she did not get a chance to go over this in detail at last appt. Aware I will send message to MD to review/see if he is ok with her scheduling appt.   Total time of call: 15 min 20 sec.

## 2022-12-21 ENCOUNTER — Other Ambulatory Visit: Payer: Self-pay | Admitting: Physician Assistant

## 2022-12-21 ENCOUNTER — Ambulatory Visit (INDEPENDENT_AMBULATORY_CARE_PROVIDER_SITE_OTHER): Payer: Medicare PPO | Admitting: Psychiatry

## 2022-12-21 DIAGNOSIS — F411 Generalized anxiety disorder: Secondary | ICD-10-CM | POA: Diagnosis not present

## 2022-12-21 NOTE — Telephone Encounter (Signed)
Angel Maddox called at 9:58 to explain that the reason you got the refill request for her Clonazepam is because her MS specialist passed away. So Lechelle needs you to take over prescribing this medication.  Next appt 7/2

## 2022-12-21 NOTE — Progress Notes (Signed)
[facility] Counselor/Therapist Progress Note  Patient ID: Angel Maddox, MRN: [id number],    Date: 12/21/2022  Time Spent: 53 minutes   Treatment Type: Individual Therapy  Reported Symptoms: anxiety (reported as #1 symptom"), depression, anger, grief  Mental Status Exam:  Appearance:   Casual     Behavior:  Appropriate, Sharing, and Motivated  Motor:  Normal (but has a limp)  Speech/Language:   Clear and Coherent  Affect:  Depressed and anxious  Mood:  anxious, depressed, and irritable  Thought process:  goal directed  Thought content:    Rumination  Sensory/Perceptual disturbances:    WNL  Orientation:  oriented to person, place, time/date, situation, day of week, month of year, year, and stated date of December 21, 2022  Attention:  Good  Concentration:  Fair  Memory:  Some short term memory issues but using "Omega-3 and that helps"  Fund of knowledge:   Good  Insight:    Good and Fair  Judgment:   Fair  Impulse Control:  Good and Fair   Risk Assessment: Danger to Self:  No Self-injurious Behavior: No Danger to Others: No Duty to Warn:no Physical Aggression / Violence:No  Access to Firearms a concern: No  Gang Involvement:No   Subjective:   Patient who was seen by this therapist previously, is here for appt today in crisis situation re: her 2 grandchilden, Angel Maddox age67, and Angel Maddox age 67. These 2 grandchildren are the children of patient's deceased son and the kids are living currently with  their paternal grandparents. Mom of kid is living away from the kids in her own apartment. The maternal grandparents current have "regular custody" of the 2 grandchildren. Patient agitated verbally, some tearfulness, talking loudly but did calm down and lowered her voice. Was able to talk through her stressors going on currently within the family and involving grandchildren, custody, and unresolved grief and anger. Did feel better in feeling heard in session today.  Coping can be  difficult "with my MS". To continue more self-calming activities outside of session and refrain from assuming worst case scenarios. Will see again next week.   Interventions: Cognitive Behavioral Therapy and Ego-Supportive  Long term goal: Develop the ability to recognize, accept, and cope with feelings of depression and anxiety. Short term goal: Learn and implement calming skills to reduce overall tension and moments of increased anxiety and/or depression. Patient will recognize her negative self-talk, interrupt it, and not let it prevent her from trying new strategies to help herself. Strategy: Teach patient cognitive and somatic calming skills .  Rehearse with patient how to apply these skills to her daily life.  Review and reinforce success while providing corrective feedback toward consistent implementation.  Diagnosis:   ICD-10-CM   1. Generalized anxiety disorder  F41.1      Plan: Patient in for session today and working on personal and family situations involving anger, sadness, worry, unresolved grief, and escalated emotions for patient. Per tx goal plan, worked on implementing calming skills to reduce tension and better management of increased anxiety, and unresolved grief.  Felt heard, supported, and empowered during session today. To return for followup appt next week.   Self rating scales: 1-10 depression scale-6 1-10 anxiety scale-8 1-10 self-esteem scale-4 1-10 motivation scale-6 1-10 hopefulness scale-6  Goal review and progress/challenges noted with patient.  Next appointment within 2 weeks.   Mathis Fare, LCSW

## 2022-12-22 ENCOUNTER — Ambulatory Visit: Payer: Medicare PPO | Admitting: Physician Assistant

## 2022-12-22 ENCOUNTER — Other Ambulatory Visit: Payer: Self-pay | Admitting: Physician Assistant

## 2022-12-22 ENCOUNTER — Telehealth: Payer: Self-pay | Admitting: Neurology

## 2022-12-22 NOTE — Telephone Encounter (Signed)
Called pt.

## 2022-12-22 NOTE — Telephone Encounter (Signed)
Pt last seen 11/17/22 and next f/u 03/23/23.Per Dr. Bonnita Hollow last note he documented:   She is on primidone 250 mg po tid for tremors   2. She also takes baclofen in the morning and lunch as she reports left leg spasticity and she takes tizanidine at night.   3. Gabapentin has helped the dysesthesias (600 mg po TID)    I called pt to confirm how she is taking each medication.   Gabapentin 300mg , 2 po TID Primidone 250mg  (told to cut back 125mg  but ineffective and went back to 250mg , 1 po TID) Tizanidine 4mg , 1 po at bedtime Baclofen 10mg , 1 in am, 1 at noon  Pharmacy: [id number] - [place], [place] - [address]   Has high anxiety/depression. Dr. Tinnie Gens was prescribing clonazepam 1mg  po at bedtime (he wrote she could take up to three times daily). Currently taking 2 po every day. She has psychiatrist. She reached out and left message to see if she can take over prescribing this one for her.   Reports R leg drags, has to get assistance to walk. Has episodes where she cannot walk. Gets very fatigued easily. Wondering about previous MRI imaging that showed stroke. Wanting to know if Dr. Epimenio Foot feels this is what is causing her symptoms? Aware I will send message to Dr. Epimenio Foot for review and we will call back.

## 2022-12-22 NOTE — Telephone Encounter (Signed)
LVM relaying Dr. Bonnita Hollow message. Advised her to call  back if she wants second opinion to [facility] and we are happy to place this referral for her as well.

## 2022-12-22 NOTE — Telephone Encounter (Signed)
Pt is asking if Dr Epimenio Foot will manage the following for GEX:BMWUXLKGM (MYSOLINE) 250 MG tablet ,  baclofen (LIORESAL) 10 MG tablet  , tiZANidine (ZANAFLEX) 4 MG capsule  & gabapentin (NEURONTIN) 300 MG capsule

## 2022-12-26 ENCOUNTER — Other Ambulatory Visit: Payer: Self-pay | Admitting: Physician Assistant

## 2022-12-27 ENCOUNTER — Ambulatory Visit (INDEPENDENT_AMBULATORY_CARE_PROVIDER_SITE_OTHER): Payer: Medicare PPO | Admitting: Physician Assistant

## 2022-12-27 ENCOUNTER — Encounter: Payer: Self-pay | Admitting: Physician Assistant

## 2022-12-27 ENCOUNTER — Other Ambulatory Visit: Payer: Self-pay | Admitting: Physician Assistant

## 2022-12-27 DIAGNOSIS — G4700 Insomnia, unspecified: Secondary | ICD-10-CM

## 2022-12-27 DIAGNOSIS — Z638 Other specified problems related to primary support group: Secondary | ICD-10-CM

## 2022-12-27 DIAGNOSIS — G35 Multiple sclerosis: Secondary | ICD-10-CM

## 2022-12-27 DIAGNOSIS — F4321 Adjustment disorder with depressed mood: Secondary | ICD-10-CM | POA: Diagnosis not present

## 2022-12-27 DIAGNOSIS — F4323 Adjustment disorder with mixed anxiety and depressed mood: Secondary | ICD-10-CM | POA: Diagnosis not present

## 2022-12-27 MED ORDER — BUPROPION HCL ER (SR) 200 MG PO TB12: 200.0000 mg | ORAL_TABLET | Freq: Two times a day (BID) | ORAL | 11 refills | Status: DC

## 2022-12-27 MED ORDER — CITALOPRAM HYDROBROMIDE 40 MG PO TABS: 40.0000 mg | ORAL_TABLET | Freq: Every day | ORAL | 11 refills | Status: DC

## 2022-12-27 NOTE — Progress Notes (Signed)
[facility] Med Check  Patient ID: Angel Maddox,  MRN: [id number]  PCP: Kirstie Peri, MD  Date of Evaluation: 12/27/2022  Time spent:30 minutes  HISTORY/CURRENT STATUS: For routine 6 month med check.    Her long time neurologist died in the past year, she'd been seeing him for 25 or so years for MS. Transferred to a diff neuro closer to home, but thinking about going back to the other office, even though it's a long drive. 'They know all about me there.'  More stressed, her grandkid's other grandmother (her deceased son's former MIL) won't let her see the kids. States she's coldhearted and it's really torn her up. Pt started therapy again last week. Sees Rockne Menghini, LCSW.  She's crying a lot. Energy and motivation are low but some of that could be from the MS. ADLs and personal hygiene are nl. Appetite has been decreased. Seeing a nutritionist, going to chair yoga, also working out on the machines at the gym.  More anxious, started Klonopin again, mostly for night use. Memory is the same. No SI/HI.   Patient denies increased energy with decreased need for sleep, increased talkativeness, racing thoughts, impulsivity or risky behaviors, increased spending, increased libido, grandiosity, increased irritability or anger, paranoia, or hallucinations.  Denies dizziness, syncope, seizures, numbness, tingling, tremor, tics, slurred speech, confusion.  Does have muscle pain and stiffness at times, due to the MS. Denies unexplained weight loss, frequent infections, or sores that heal slowly.  No polyphagia, polydipsia, or polyuria. Denies visual changes or paresthesias.   Individual Medical History/ Review of Systems: Changes? :No     Past medications for mental health diagnoses include: Depakote, Lamictal caused insomnia, constipation, and tremor, Risperdal, Celexa, Klonopin, trazodone, Wellbutrin, Vistaril, primidone, gabapentin, Prozac, Zyprexa, Ambien, Lunesta, Effexor  Allergies:  Hydrocodone  Current Medications:  Current Outpatient Medications:    amLODipine (NORVASC) 10 MG tablet, Take 1 tablet (10 mg total) by mouth daily., Disp: 90 tablet, Rfl: 1   baclofen (LIORESAL) 10 MG tablet, Take 10 mg by mouth 2 (two) times daily. Takes one in the morning and one at noon, Disp: , Rfl:    clonazePAM (KLONOPIN) 1 MG disintegrating tablet, 1 mg at bedtime., Disp: , Rfl:    clonazePAM (KLONOPIN) 1 MG tablet, TAKE 1 TABLET BY MOUTH THREE TIMES A DAY, Disp: 90 tablet, Rfl: 0   dalfampridine 10 MG TB12, 2 (two) times daily., Disp: , Rfl:    dicyclomine (BENTYL) 10 MG capsule, TAKE 1 CAPSULE (10 MG TOTAL) BY MOUTH EVERY 8 (EIGHT) HOURS AS NEEDED FOR SPASMS., Disp: 30 capsule, Rfl: 1   gabapentin (NEURONTIN) 300 MG capsule, Take 2 capsules (600 mg total) by mouth 3 (three) times daily., Disp: 180 capsule, Rfl: 0   losartan (COZAAR) 25 MG tablet, TAKE 1 TABLET BY MOUTH EVERY DAY, Disp: 90 tablet, Rfl: 1   mirtazapine (REMERON) 15 MG tablet, TAKE 0.5-1 TABLETS (7.5-15 MG TOTAL) BY MOUTH AT BEDTIME AS NEEDED., Disp: 90 tablet, Rfl: 0   mupirocin ointment (BACTROBAN) 2 %, Apply 1 application topically 2 (two) times daily., Disp: 60 g, Rfl: 0   Ofatumumab (KESIMPTA) 20 MG/0.4ML SOAJ, Inject 20 mg into the skin every 30 (thirty) days., Disp: , Rfl:    omeprazole (PRILOSEC) 40 MG capsule, TAKE 1 CAPSULE (40 MG) DAILY. PLEASE SCHEDULE A YEARLY FOLLOW UP FOR FURTHER REFILLS. THANK YOU, Disp: 30 capsule, Rfl: 0   Oxycodone HCl 10 MG TABS, Take 10 mg by mouth 3 (three) times daily as needed.,  Disp: , Rfl:    primidone (MYSOLINE) 250 MG tablet, Take 250 mg by mouth 3 (three) times daily., Disp: , Rfl:    tiZANidine (ZANAFLEX) 4 MG capsule, Take 4 mg by mouth at bedtime., Disp: , Rfl:    buPROPion (WELLBUTRIN SR) 200 MG 12 hr tablet, Take 1 tablet (200 mg total) by mouth 2 (two) times daily., Disp: 60 tablet, Rfl: 11   citalopram (CELEXA) 40 MG tablet, Take 1 tablet (40 mg total) by mouth daily.,  Disp: 30 tablet, Rfl: 11   ondansetron (ZOFRAN-ODT) 4 MG disintegrating tablet, TAKE 1 TABLET BY MOUTH EVERY 8 HOURS AS NEEDED FOR NAUSEA AND VOMITING (Patient not taking: Reported on 02/15/2022), Disp: 30 tablet, Rfl: 2   psyllium (METAMUCIL) 58.6 % powder, Take 1 packet by mouth daily. (Patient not taking: Reported on 11/17/2022), Disp: , Rfl:  Medication Side Effects: none  Family Medical/ Social History: Changes? No   MENTAL HEALTH EXAM:  There were no vitals taken for this visit.There is no height or weight on file to calculate BMI.  General Appearance: Casual, Neat and Well Groomed  Eye Contact:  Good  Speech:  Clear and Coherent and Normal Rate  Volume:  Normal  Mood:  Depressed and Hopeless  Affect:  Depressed and Tearful  Thought Process:  Goal Directed and Descriptions of Associations: Circumstantial  Orientation:  Full (Time, Place, and Person)  Thought Content: Logical   Suicidal Thoughts:  No  Homicidal Thoughts:  No  Memory:  WNL  Judgement:  Good  Insight:  Good  Psychomotor Activity:   walks slowly, has brace on left lower leg  Concentration:  Concentration: Good  Recall:  Good  Fund of Knowledge: Good  Language: Good  Assets:  Desire for Improvement Financial Resources/Insurance Housing Resilience Transportation  ADLs nl  Cognition: WNL  Prognosis:  Good   DIAGNOSES:    ICD-10-CM   1. Situational mixed anxiety and depressive disorder  F43.23     2. Stress due to family tension  Z63.8     3. Insomnia, unspecified type  G47.00     4. Complicated grief  F43.21     5. MS (multiple sclerosis) (HCC)  G35      Receiving Psychotherapy: Yes  Rockne Menghini, LCSW   RECOMMENDATIONS:  PDMP was reviewed.  Klonopin filled 12/22/2022.  Gabapentin filled 12/18/2022.   I provided 30 minutes of face to face time during this encounter, including time spent before and after the visit in records review, medical decision making, counseling pertinent to today's visit, and  charting.   I'm sorry to hear about the stressful situation in her family. I'm glad she's back in therapy. We agree to leave her on same meds for now. Consider changing if the situation resolves and she still has severe depression. Contract for safety in place. Call the office on-call service, 988/hotline, 911, or present to [facility] or ER if any life-threatening psychiatric crisis. Patient verbalizes understanding.   Continue Wellbutrin SR 200 mg, 1 p.o. twice daily. Continue Celexa 40 mg, 1 p.o. daily. Continue Klonopin 1 mg, 1 po tid prn. Continue Gabapentin 300 mg, 2 po tid per another provider. Continue Mirtazapine 15 mg, 1/2-1 po at bedtime prn sleep.  Continue therapy with Rockne Menghini, LCSW.  Return in 2 months.  Melony Overly, PA-C

## 2022-12-27 NOTE — Telephone Encounter (Signed)
Called pt and let her know that Dr. Epimenio Foot said " She will need to get clonazepam from her PCP or be referred to psych.  Also, there is more respiratory risk with a combination of mysoline and clonazepam.    We can refill other med's"  Pt is still reverting back to Dr. Leotis Shames telling her she has MS, asked pt if she would like to work with [place] in trying to figure out what is going with her or would she like to get a 2nd Opinion with [facility] per Dr. Epimenio Foot and she said no she doesn't want to hop doctors, but she still insist that she has MS. Will send to Dr. Epimenio Foot.

## 2022-12-28 ENCOUNTER — Ambulatory Visit (INDEPENDENT_AMBULATORY_CARE_PROVIDER_SITE_OTHER): Payer: Medicare PPO | Admitting: Psychiatry

## 2022-12-28 DIAGNOSIS — F411 Generalized anxiety disorder: Secondary | ICD-10-CM | POA: Diagnosis not present

## 2022-12-28 NOTE — Progress Notes (Signed)
[facility] Counselor/Therapist Progress Note  Patient ID: CITLALLY OGANDO, MRN: [id number],    Date: 12/28/2022  Time Spent: 53 minutes   Treatment Type: Individual Therapy  Reported Symptoms: anxiety, anger, grief, depression, stressed, tearfulness, denies any SI  Mental Status Exam:  Appearance:   Casual     Behavior:  Appropriate, Sharing, and Motivated; although motivation is challenged currently  Motor:  Some walking issues with "drop foot" and weakness  Speech/Language:   Clear and Coherent  Affect:  Anxiety, stressed  Mood:  anxious, depressed, and stressed  Thought process:  goal directed  Thought content:    Rumination  Sensory/Perceptual disturbances:    WNL  Orientation:  oriented to person, place, time/date, situation, day of week, month of year, year, and stated date of December 28, 2022  Attention:  Good  Concentration:  Good  Memory:  Some short term memory issues and Dr is aware  Fund of knowledge:   Good  Insight:    Good and Fair  Judgment:   Good  Impulse Control:  Good   Risk Assessment: Danger to Self:  No Self-injurious Behavior: No Danger to Others: No Duty to Warn:no Physical Aggression / Violence:No  Access to Firearms a concern: No  Gang Involvement:No   Subjective:  Patient in today reporting anxiety, feeling stressed, depressed, and some anger within family/marital relationships, and in relationship with her grandchildren (girl is 49 and boy is 59) and their legal custodians (other set of grandparents).   Cried at length today in session, talking through her tears and frustration/anger/sadness/anxiety/depression and talked through these emotions openly. Felt supported but also strengthened. Has tried talking with husband but he refuses marital therapy. Patient states she still plans to stay with husband.Feeling misled by husband at times. Working further on her "confidence", stated I' have no self esteem " and we discussed ways patient can improve  her self-esteem especially in refraining from "putting myself down".  States "prayer is helpful too". Continues trying to coach herself through each day with her MS which can be a challenge. Focused more heavily today on her self-esteem, hurt, and motivation, especially what can help and what does not help.  Reminded her regarding continuing self calming activities and not dwelling on assumptions of "worst-case scenarios".  Emphasized motivation today even in small ways.  Interventions: Cognitive Behavioral Therapy and Ego-Supportive  Long term goal: Develop the ability to recognize, accept, and cope with feelings of depression and anxiety. Short term goal: Learn and implement calming skills to reduce overall tension and moments of increased anxiety and/or depression. Patient will recognize her negative self-talk, interrupt it, and not let it prevent her from trying new strategies to help herself. Strategy: Teach patient cognitive and somatic calming skills .  Rehearse with patient how to apply these skills to her daily life.  Review and reinforce success while providing corrective feedback toward consistent implementation.  Diagnosis:   ICD-10-CM   1. Generalized anxiety disorder  F41.1      Plan: Patient in today and seem to be a little more motivated as the session went on and as she was able to vent a lot of hurtful feelings but also look at opportunities ahead to better manage the hurt, to also be able to see positives when they occur, and also recognize her own strength as she works on treatment goals.  Is working to make some progress and needing to continue working with goal-directed behaviors in order to move in  a forward direction.  Denies any SI.  Encouraged patient in her practice of positive and self affirming behaviors including: Staying in the present and focusing on what she can control or change, believing in herself more, understand that some of her anxious thoughts are "just  thoughts" and are not necessarily going to play out in reality the way she thinks, getting outside some each day, staying in touch with supportive people, looking for more positives versus negatives daily, remain on her prescribed medication, practice "the pause" as needed, healthy nutrition and exercise, refrain from assuming negatives and worst-case scenarios, use of positive self talk, refrain from self negating, practice more intentional listening to others that care about her, reduce overthinking and over analyzing, challenge and counteract her self-doubt, working to let go of things including guilt from the past that can hold her back currently from moving forward, and recognize the strength she shows when working with goal-directed behaviors to move in a direction that supports increased confidence, improved emotional health, and overall wellbeing.   Self rating scales: (updated 12/28/2022) 1-10 depression scale-7 1-10 anxiety scale-8 1-10 self-esteem scale-1/2 1-10 motivation scale-4 1-10 hopefulness scale-6  Goal review and progress/challenges noted with patient.  Next appointment within 1-2 weeks.   Mathis Fare, LCSW

## 2023-01-04 ENCOUNTER — Ambulatory Visit (INDEPENDENT_AMBULATORY_CARE_PROVIDER_SITE_OTHER): Payer: Medicare PPO | Admitting: Psychiatry

## 2023-01-04 DIAGNOSIS — F411 Generalized anxiety disorder: Secondary | ICD-10-CM | POA: Diagnosis not present

## 2023-01-04 NOTE — Progress Notes (Signed)
[facility] Counselor/Therapist Progress Note  Patient ID: Angel Maddox, MRN: [id number],    Date: 01/04/2023  Time Spent: 50 minutes  Treatment Type: Individual Therapy  Reported Symptoms:  anxiety, depression, frustration, anger  Mental Status Exam:  Appearance:   Casual     Behavior:  Sharing and Motivated  Motor:  Normal  Speech/Language:   Clear and Coherent  Affect:  Depressed and anixous, irritable  Mood:  anxious, depressed, and irritable  Thought process:  goal directed  Thought content:    WNL  Sensory/Perceptual disturbances:    WNL  Orientation:  oriented to person, place, time/date, situation, day of week, month of year, year, and stated date of January 04, 2023  Attention:  Fair  Concentration:  Good and Fair  Memory:  Some occasional short term issues and Dr is aware  Fund of knowledge:   Good  Insight:    Fair  Judgment:   Good and Fair  Impulse Control:  Good and Fair   Risk Assessment: Danger to Self:  No Self-injurious Behavior: No Danger to Others: No Duty to Warn:no Physical Aggression / Violence:No  Access to Firearms a concern: No  Gang Involvement:No   Subjective:   Patient in session today reporting anxiety, depression, stress, anger, and frustration mostly related to family relationships, and particularly relating to not seeing her grandchildren ( 58 yr old boy and 69 yr old girl) who live with their other grandparents. Needed session today to share and process more about the various relationship issues involved with patient/grandchildren/ and other set of custodial grandparents. Patient very loud and angry today talking through her anger and frustration re: family chaos and angry feelings. Did talk about how patient overly focuses on certain other people, when she is needing to let go of others that frequently "pull me down". Very easy to jump to conclusions. Over-generalizing especially in certain family dynamics. Not as tearful today. Worked  further on her letting go, not making quick assumptions, and the need to have healthier boundaries with grandchildren. Specific work on her self-esteem and motivation today. Was able to calm down in session and speak in more of a "regular tone" rather than so loud. Able to recognize some of how she lets certain others in family control her and is working on letting go of that and "trying not to let others control me so much." States that prayer helps her with these family issues and with her MS difficulties as well.   Interventions: Cognitive Behavioral Therapy and Ego-Supportive  Long term goal: Develop the ability to recognize, accept, and cope with feelings of depression and anxiety. Short term goal: Learn and implement calming skills to reduce overall tension and moments of increased anxiety and/or depression. Patient will recognize her negative self-talk, interrupt it, and not let it prevent her from trying new strategies to help herself. Strategy: Teach patient cognitive and somatic calming skills .  Rehearse with patient how to apply these skills to her daily life.  Review and reinforce success while providing corrective feedback toward consistent implementation.   Diagnosis:   ICD-10-CM   1. Generalized anxiety disorder  F41.1      Plan:   Patient in session today getting further on her stress, anxiety, depression, anger, and frustration mostly related to family issues and relationships particularly involving grandchildren who live with the other set of grandparents as they have custody.  Patient able to be more calm and focused in her talking today with  reminders by therapist.  Also trying not to be so consumed with other people.  Patient is showing some progress and needs to continue working with goal-directed behaviors to move in a positive direction. Encouraged patient and practicing positive and self affirming behaviors including: Staying in the present and focusing on what she can  change or control, believing herself more, understand that some of her anxious thoughts are "just thoughts" and are not necessarily going to play out in reality the way she thinks/fears, getting outside some each day, staying in touch with supportive people, look for more positives versus negatives daily, remain on her prescribed medication, practice "the pause" as needed, healthy nutrition and exercise, refrain from assuming negatives and worst-case scenarios, use of positive self talk, refrain from self negating, practice more intentional listening to others that care about her, reduce overthinking and over analyzing, challenging counteract her self-doubt, work to let go of things including guilt from the past that can hold her back currently from moving forward, and realize the strength she shows working with goal-directed behaviors to move in a direction that supports increased confidence, improved emotional health, and outlook.  Self rating scales: (updated 01/04/2023) 1-10 depression scale-6/7 1-10 anxiety scale-7/8 1-10 self-esteem scale-1/2 1-10 motivation scale-4 1-10 hopefulness scale-6   Goal review and progress/challenges noted with patient.  Next appointment within 1 to 2 weeks.   Mathis Fare, LCSW

## 2023-01-09 ENCOUNTER — Other Ambulatory Visit: Payer: Self-pay | Admitting: Gastroenterology

## 2023-01-09 DIAGNOSIS — R1013 Epigastric pain: Secondary | ICD-10-CM

## 2023-01-12 ENCOUNTER — Ambulatory Visit (INDEPENDENT_AMBULATORY_CARE_PROVIDER_SITE_OTHER): Payer: Medicare PPO | Admitting: Psychiatry

## 2023-01-12 DIAGNOSIS — F411 Generalized anxiety disorder: Secondary | ICD-10-CM

## 2023-01-12 NOTE — Progress Notes (Signed)
[facility] Counselor/Therapist Progress Note  Patient ID: Angel Maddox, MRN: [id number],    Date: 01/12/2023  Time Spent: 53 minutes   Treatment Type: Individual Therapy  Reported Symptoms: anxiety, frustration, depression  Mental Status Exam:  Appearance:   Casual     Behavior:  Appropriate, Sharing, and Motivated  Motor:  Normal  Speech/Language:   Clear and Coherent  Affect:  Depressed and anxious, frustrated  Mood:  anxious and depressed  Thought process:  goal directed  Thought content:    Rumination  Sensory/Perceptual disturbances:    WNL  Orientation:  oriented to person, place, time/date, situation, day of week, month of year, year, and stated date of January 12, 2023  Attention:  Good  Concentration:  Good  Memory:  Some short term memory issues and Dr is aware  Fund of knowledge:   Good  Insight:    Good and Fair  Judgment:   Good  Impulse Control:  Good "most of the time"   Risk Assessment: Danger to Self:  No Self-injurious Behavior: No Danger to Others: No Duty to Warn:no Physical Aggression / Violence:No  Access to Firearms a concern: No  Gang Involvement:No   Subjective:   Patient in today reporting stress, anxiety, anger, depression, and frustration related to family situations involving grandchildren and their other set of grandparents who have custody of them.  States today that she needs to focus on communication issues between patient and husband and some issues concerning her 2 grandchildren who are living with their other grandparents. Supported patient in looking at ways she can make some changes in her communication with others in family in addition to her husband, including grandchildren. Worked on more positive communication, paying more attention to "how she says, what she says" and is to try some new skills in upcoming communication with husband and [name], based on her work in session today.  Patient able to stay much more grounded today  which is an indication of the work she is doing and beginning to see herself in a more positive/hopeful way.  Not jumping to conclusions as easily.  Continues to over generalized some but is good also in recognizing it at times and making no change.  Decrease in tearfulness.  Continues her work to "let go" in situations where it is easy for her to try and control but does not lead to good results when she is controlling.  Less overgeneralizing within the family.  Continued work on self-esteem and motivation.  Much better focus in session today with some redirection.  Interventions: Cognitive Behavioral Therapy and Ego-Supportive  Long term goal: Develop the ability to recognize, accept, and cope with feelings of depression and anxiety. Short term goal: Learn and implement calming skills to reduce overall tension and moments of increased anxiety and/or depression. Patient will recognize her negative self-talk, interrupt it, and not let it prevent her from trying new strategies to help herself. Strategy: Teach patient cognitive and somatic calming skills .  Rehearse with patient how to apply these skills to her daily life.  Review and reinforce success while providing corrective feedback toward consistent implementation.  Diagnosis:   ICD-10-CM   1. Generalized anxiety disorder  F41.1      Plan: Patient today actively participating in session and showing good motivation as she worked further on her stress, anxiety, depression, and frustration mostly related to personal, marital, and extended family situations.  She is making progress and working with her goals and  needs to continue working with goal-directed behaviors in order to keep moving in a forward direction. Encouraged patient to be practicing positive and self affirming behaviors including: Remain in the present and focused on what she can change, believing herself more, understand that some of her anxious thoughts are "just thoughts" and are  not necessarily going to play out in reality the way she thinks/fears, getting outside some each day, staying in touch with supportive people, look for more positives versus negatives daily, stay on her prescribed medication, practice "the pause" as needed, refrain from assuming negatives and worst-case scenarios, positive self talk, practice more intentional listening to others that care about her, reduce overthinking and over analyzing, challenge and counteract her self-doubt, work to let go of things including guilt from the past that can hold her back now, and recognize the strength that she shows working with goal-directed behaviors to move in a direction that supports her increased confidence, improved emotional health, and overall wellbeing.  Self rating scales: (updated 01/12/2023) 1-10 depression scale-6 1-10 anxiety scale-7 1-10 self-esteem scale-3/4 1-10 motivation scale-5/6 1-10 hopefulness scale-6  Goal review and progress/challenges noted with patient.  Next appointment within 2 weeks.   Mathis Fare, LCSW

## 2023-01-13 ENCOUNTER — Other Ambulatory Visit: Payer: Self-pay | Admitting: Gastroenterology

## 2023-01-13 ENCOUNTER — Telehealth: Payer: Self-pay | Admitting: Gastroenterology

## 2023-01-13 DIAGNOSIS — R1013 Epigastric pain: Secondary | ICD-10-CM

## 2023-01-13 MED ORDER — OMEPRAZOLE 40 MG PO CPDR
40.0000 mg | DELAYED_RELEASE_CAPSULE | Freq: Every day | ORAL | 0 refills | Status: DC
Start: 2023-01-13 — End: 2023-04-14

## 2023-01-13 NOTE — Telephone Encounter (Signed)
Inbound call from patient stating she needs a refill for Omeprazole. Patient was scheduled for office visit on 11/12 at 2:10 with Dr. Adela Lank. Patient is requesting refill be sent in and is requesting a 90 day supply. Please advise.

## 2023-01-13 NOTE — Telephone Encounter (Signed)
Refill sent to pharmacy.   

## 2023-01-16 ENCOUNTER — Ambulatory Visit (INDEPENDENT_AMBULATORY_CARE_PROVIDER_SITE_OTHER): Payer: Medicare PPO | Admitting: Psychiatry

## 2023-01-16 ENCOUNTER — Other Ambulatory Visit: Payer: Self-pay | Admitting: Neurology

## 2023-01-16 DIAGNOSIS — F411 Generalized anxiety disorder: Secondary | ICD-10-CM

## 2023-01-16 MED ORDER — GABAPENTIN 300 MG PO CAPS: 600.0000 mg | ORAL_CAPSULE | Freq: Three times a day (TID) | ORAL | 0 refills | Status: AC

## 2023-01-16 NOTE — Telephone Encounter (Signed)
Called and LVM for pt informing her.

## 2023-01-16 NOTE — Telephone Encounter (Signed)
Requested Prescriptions   Pending Prescriptions Disp Refills   gabapentin (NEURONTIN) 300 MG capsule 180 capsule 0    Sig: Take 2 capsules (600 mg total) by mouth 3 (three) times daily.   Last seen 11/17/22. Next appt scheduled 03/23/23  Looks like she trying to fill it early for vacation but she has been getting from another provider. Routing to provider to determine if they are agreeable to grant early fill for vacation Dispenses    Dispensed Days Supply Quantity Provider Pharmacy  GABAPENTIN 300 MG CAPSULE 12/18/2022 30 180 each Christen Butter, FNP [facility] [phone] - M...  GABAPENTIN 300 MG CAPSULE 11/15/2022 30 180 each Christen Butter, FNP [facility] [phone] - M...  GABAPENTIN 300 MG CAPSULE 10/14/2022 30 180 each Christen Butter, FNP [facility] [phone] - M...  GABAPENTIN 300 MG CAPSULE 09/16/2022 30 180 each Christen Butter, FNP [facility] [phone] - M...  GABAPENTIN 300 MG CAPSULE 08/17/2022 30 180 each Christen Butter, FNP [facility] [phone] - M...  GABAPENTIN 300 MG CAPSULE 07/17/2022 30 180 each Christen Butter, FNP [facility] [phone] - M...  GABAPENTIN 300 MG CAPSULE 06/19/2022 30 180 each Christen Butter, FNP [facility] [phone] - M...  GABAPENTIN 300 MG CAPSULE 05/17/2022 30 180 each Christen Butter, FNP [facility] [phone] - M...  GABAPENTIN 300 MG CAPSULE 04/17/2022 30 180 each Christen Butter, FNP [facility] [phone] - M...  GABAPENTIN 300 MG CAPSULE 03/06/2022 30 180 each Christen Butter, FNP [facility] [phone] - S...  GABAPENTIN 300 MG CAPSULE 01/29/2022 30 180 each Christen Butter, FNP [facility] [phone] - M.Marland KitchenMarland Kitchen

## 2023-01-16 NOTE — Telephone Encounter (Signed)
Pt is preparing to go out of town and is asking if she can fill her gabapentin (NEURONTIN) 300 MG capsule  a little early:[facility] [phone]

## 2023-01-16 NOTE — Progress Notes (Signed)
[facility] Counselor/Therapist Progress Note  Patient ID: Angel Maddox, MRN: [id number],    Date: 01/16/2023  Time Spent: 50 minutes   Treatment Type: Individual Therapy  Reported Symptoms: anxiety, depression, less anger, less frustration, no SI  Mental Status Exam:  Appearance:   Casual     Behavior:  Appropriate, Sharing, and Motivated  Motor:  Normal  Speech/Language:   Clear and Coherent  Affect:  Depressed and anxious  Mood:  anxious and depressed  Thought process:  goal directed  Thought content:    Rumination  Sensory/Perceptual disturbances:    WNL  Orientation:  oriented to person, place, time/date, situation, day of week, month of year, year, and stated date of January 16, 2023  Attention:  Good  Concentration:  Good and Fair  Memory:  WNL  Fund of knowledge:   Good  Insight:    Good and Fair  Judgment:   Good and Fair  Impulse Control:  Good and Fair   Risk Assessment: Danger to Self:  No Self-injurious Behavior: No Danger to Others: No Duty to Warn:no Physical Aggression / Violence:No  Access to Firearms a concern: No  Gang Involvement:No   Subjective: Patient today reporting anxiety, depression, but less frustration and less anger.  Some improvement in marital communication (listening and talking). Patient shared several examples of this in session today. Patient has significantly improved in talking through the stressors/challenges she is facing within the family and involving grandchildren (who are kids of patient's deceased son). Patient sharing more today of the impact of the loss of her son at age 67, and how difficult that was. Thinking more about this loss recently as she is approaching his birthday next month. Lots of challenges patient shares within the family, marriage, and herself personally.  Did share that she and husband most recently have been trying to improve their communication, listening, and their marriage. She feels they are beginning  to recognize some progress. Continues her work on increasing positive communication and "how I say what I say." Having some more success in trying to ground herself during times of increased stress.  Continues to over generalized at times and also catches herself frequently when she does it.  Goal-directed behaviors also impacting her self-esteem and motivation in a positive way.  Interventions: Cognitive Behavioral Therapy  Long term goal: Develop the ability to recognize, accept, and cope with feelings of depression and anxiety. Short term goal: Learn and implement calming skills to reduce overall tension and moments of increased anxiety and/or depression. Patient will recognize her negative self-talk, interrupt it, and not let it prevent her from trying new strategies to help herself. Strategy: Teach patient cognitive and somatic calming skills .  Rehearse with patient how to apply these skills to her daily life.  Review and reinforce success while providing corrective feedback toward consistent implementation.  Diagnosis:   ICD-10-CM   1. Generalized anxiety disorder  F41.1      Plan: Patient in session today working further on her anxiety, depression, communication issues including listening and talking.  Self-awareness is increasing.  Is making progress and needs to continue working with goal-directed behaviors in order to keep moving in a forward direction.  Encouraged patient in her practice of positive and self affirming behaviors including: Staying in the present and focused on what she can change, believing more in herself, understand that some of her anxious thoughts are "just thoughts" and are not necessarily going to play out in reality  the way she thinks/fears, getting outside some each day, staying in touch with supportive people, looking for more positives versus negatives daily, remain on her prescribed medication, practice "the pause" as needed, refrain from assuming negatives and  worst-case scenarios, use of more positive self talk, practice more intentional listening to others that care about her, reduce overthinking and over analyzing, challenge and counteract self-doubt, work to let go of things including guilt from the past that hold her back now, and realize the strength she shows working with goal-directed behaviors to move in a direction that supports her increased confidence, improved emotional health,  Self rating scales: (updated 01/16/2023) 1-10 depression scale-5/6 1-10 anxiety scale-7 1-10 self-esteem scale-4/5 1-10 motivation scale-6 1-10 hopefulness scale-6  Goal review and progress/challenges noted with patient.  Next appointment within 2 weeks.   Mathis Fare, LCSW

## 2023-01-16 NOTE — Telephone Encounter (Signed)
Let the pt know that the rx was refilled

## 2023-01-23 ENCOUNTER — Ambulatory Visit (INDEPENDENT_AMBULATORY_CARE_PROVIDER_SITE_OTHER): Payer: Medicare PPO | Admitting: Psychiatry

## 2023-01-24 ENCOUNTER — Ambulatory Visit (INDEPENDENT_AMBULATORY_CARE_PROVIDER_SITE_OTHER): Payer: Medicare PPO | Admitting: Psychiatry

## 2023-01-24 DIAGNOSIS — F411 Generalized anxiety disorder: Secondary | ICD-10-CM

## 2023-01-24 NOTE — Progress Notes (Signed)
[facility] Counselor/Therapist Progress Note  Patient ID: Angel Maddox, MRN: [id number],    Date: 01/24/2023  Time Spent: 53 minutes   Treatment Type: Individual Therapy  Reported Symptoms: anxiety, depression, tearfulness, anger. Denies any SI.  Mental Status Exam:  Appearance:   Casual and Neat     Behavior:  Appropriate, Sharing, and Motivated  Motor:  Normal  Speech/Language:   Clear and Coherent  Affect:  Depressed and anxious, tearful  Mood:  anxious, depressed, and sad  Thought process:  goal directed  Thought content:    Rumination  Sensory/Perceptual disturbances:    WNL  Orientation:  oriented to person, place, time/date, situation, day of week, month of year, year, and stated date of January 24, 2023  Attention:  Fair  Concentration:  Good and Fair  Memory:  WNL  Fund of knowledge:   Good  Insight:    Fair  Judgment:   Good and Fair  Impulse Control:  Good and Fair   Risk Assessment: Danger to Self:  No Self-injurious Behavior: No Danger to Others: No Duty to Warn:no Physical Aggression / Violence:No  Access to Firearms a concern: No  Gang Involvement:No   Subjective:  Patient in session today reporting anxiety, depression, continued family dysfunction behaviors, tearfulness, and anger. Tearfully explained and explored her depression and anxiety and are "directly related to my being hurt by her adult daughter" blaming patient for "things that are not my fault". Shares that daughter "has a lot of emotional problems and is manipulative especially within family." Processing this more in session today and also looking at more effective ways to communicate with daughter that could be less conflictual which is patient's preference. Frequent bouts of family-related turmoil recently which was very hurtful for patient and she did end up leaving daughter's home. Focused more on her reactions with daughter and setting/keeping healthier boundaries.  Continues to also  work on marital communication, and paying closer attention to what helps and what does not help, or makes the situation worse. Reflecting some re: death of son a few yrs previously, stating she thought of him because his birthday is coming up soon. Does see some growth in managing her grief and working on relationship issues.   Interventions: Cognitive Behavioral Therapy and Ego-Supportive  Long term goal: Develop the ability to recognize, accept, and cope with feelings of depression and anxiety. Short term goal: Learn and implement calming skills to reduce overall tension and moments of increased anxiety and/or depression. Patient will recognize her negative self-talk, interrupt it, and not let it prevent her from trying new strategies to help herself. Strategy: Teach patient cognitive and somatic calming skills .  Rehearse with patient how to apply these skills to her daily life.  Review and reinforce success while providing corrective feedback toward consistent implementation.   Diagnosis:   ICD-10-CM   1. Generalized anxiety disorder  F41.1      Plan: Patient actively participating in session today focusing more on her anxiety, depression, conflictual relationship with adult daughter, and communication issues within the family and beyond including listening and talking.  She is making progress and her self-awareness is noticeably increasing which needs to continue.  Patient needs to continue working with goal-directed behaviors in order to keep moving in a positive direction.  Minded and encouraged her in her practice of healthier and self affirming behaviors including: Believing more in herself, understand that some of her anxious thoughts are "just thoughts" and are not necessarily  going to play out in reality the way she thinks/fears, stay in the present and focused on what she can change, getting outside some each day, staying in touch with supportive people, looking more for positives versus  negatives, remain on her prescribed medication, practice "the pause" is needed, refrain from assuming negatives and worst-case scenarios, use of more positive self talk, practice more intentional listening to others that care about her, reduce overthinking and over analyzing, challenge and counteract self-doubt, work to let go of things including guilt from the past that hold her back now, and recognize the strength she shows working with goal-directed behaviors to move in a direction that supports her increased confidence, improved emotional health, and overall out.  Self rating scales: (updated 01/24/2023) 1-10 depression scale-8 (no SI) 1-10 anxiety scale-9 1-10 self-esteem scale-3 1-10 motivation scale-2 1-10 hopefulness scale-3/4  Goal review and progress/challenges noted with patient.  Next appointment within 2 weeks.   Mathis Fare, LCSW

## 2023-01-25 DIAGNOSIS — G609 Hereditary and idiopathic neuropathy, unspecified: Secondary | ICD-10-CM | POA: Diagnosis not present

## 2023-01-25 DIAGNOSIS — R2689 Other abnormalities of gait and mobility: Secondary | ICD-10-CM | POA: Diagnosis not present

## 2023-01-25 DIAGNOSIS — D849 Immunodeficiency, unspecified: Secondary | ICD-10-CM | POA: Diagnosis not present

## 2023-01-25 DIAGNOSIS — R4182 Altered mental status, unspecified: Secondary | ICD-10-CM | POA: Diagnosis not present

## 2023-01-25 DIAGNOSIS — G35 Multiple sclerosis: Secondary | ICD-10-CM | POA: Diagnosis not present

## 2023-01-25 DIAGNOSIS — F339 Major depressive disorder, recurrent, unspecified: Secondary | ICD-10-CM | POA: Diagnosis not present

## 2023-01-26 ENCOUNTER — Ambulatory Visit: Payer: Medicare PPO | Admitting: Psychiatry

## 2023-01-31 ENCOUNTER — Other Ambulatory Visit: Payer: Self-pay | Admitting: Physician Assistant

## 2023-02-07 ENCOUNTER — Ambulatory Visit (INDEPENDENT_AMBULATORY_CARE_PROVIDER_SITE_OTHER): Payer: Medicare PPO | Admitting: Psychiatry

## 2023-02-07 DIAGNOSIS — F411 Generalized anxiety disorder: Secondary | ICD-10-CM | POA: Diagnosis not present

## 2023-02-07 NOTE — Progress Notes (Signed)
[facility] Counselor/Therapist Progress Note  Patient ID: Angel Maddox, MRN: [id number],    Date: 02/07/2023  Time Spent: 53 minutes   Treatment Type: Individual Therapy  Reported Symptoms: depression, anxiety, tearfulness, communication issues within family, conflict within family, "no" anger   Mental Status Exam:  Appearance:   Neat and Well Groomed     Behavior:  Appropriate, Sharing, and Motivated  Motor:  Normal  Speech/Language:   Clear and Coherent  Affect:  Depressed and anxiety  Mood:  anxious and depressed  Thought process:  goal directed  Thought content:    WNL  Sensory/Perceptual disturbances:    WNL  Orientation:  oriented to person, place, time/date, situation, day of week, month of year, year, and stated date of Aug. 13, 2024  Attention:  Good  Concentration:  Good and Fair  Memory:  WNL  Fund of knowledge:   Good  Insight:    Good and Fair  Judgment:   Good and Fair  Impulse Control:  Good and Fair   Risk Assessment: Danger to Self:  No Self-injurious Behavior: No Danger to Others: No Duty to Warn:no Physical Aggression / Violence:No  Access to Firearms a concern: No  Gang Involvement:No   Subjective: Patient in today reporting anxiety, depression, and tearfulness. Was in tears off and on during session today as she worked further on some very painful personal and family situations. Conflict, communication issues, and hurt re: relationship with adult daughter. Shared hurtful text message exchanges between her and adult daughter and tearfully processed these in session today. Discussed issues around texting when angry and how those text messages are likely to be more hurtful than helpful. Working on making better decisions especially related to family members and trying not to blame others, as this has proven to be unhelpful. States daughter "has her own emotional problems". Shared and worked on some better ways of communicating with daughter in ways  that daughter may hear patient more correctly and be able to have a healthier relationship eventually, but encourage patient to respect daughter's boundaries at this point.    Interventions: Cognitive Behavioral Therapy and Ego-Supportive  Long term goal: Develop the ability to recognize, accept, and cope with feelings of depression and anxiety. Short term goal: Learn and implement calming skills to reduce overall tension and moments of increased anxiety and/or depression. Patient will recognize her negative self-talk, interrupt it, and not let it prevent her from trying new strategies to help herself. Strategy: Teach patient cognitive and somatic calming skills .  Rehearse with patient how to apply these skills to her daily life.  Review and reinforce success while providing corrective feedback toward consistent implementation.  Diagnosis:   ICD-10-CM   1. Generalized anxiety disorder  F41.1      Plan:  Patient today in session working on her depression, anxiety, communication issues within family, and the conflictual relationship with adult daughter.  Some tearfulness, and patient does continue to show more motivation even in dealing with the hurtful situations and some of the issues that she needs to work on herself that impacts family.  Increased self-awareness.  Reminded and encouraged patient in practicing healthier and self affirming behaviors including: Believing in herself more, understanding that some of her anxious thoughts are "just thoughts" and are not necessarily going to play out in reality the way she fears, stay in the present and focused on what she can change, stay in touch with supportive people, look for more positives versus  negatives, remain on her prescribed medication, practice "the pause" as needed, refrain from assuming negatives and worst-case scenarios, use of more positive self talk, practice more intentional listening to others, reduce overthinking and over analyzing,  challenge and counteract her self-doubt, continue her work to let go of things including guilt from the past that hold her back now, be more mindful when texting and messaging other people especially during times when she is struggling with anger, and recognize the strength she shows working with goal-directed behaviors to move in a direction that supports her increased confidence, improved emotional health, and overall wellbeing.  Goal review and progress/challenges noted with patient.  Next appt within 2-3 weeks.   Mathis Fare, LCSW

## 2023-02-10 DIAGNOSIS — R202 Paresthesia of skin: Secondary | ICD-10-CM | POA: Diagnosis not present

## 2023-02-15 ENCOUNTER — Other Ambulatory Visit: Payer: Self-pay | Admitting: Physician Assistant

## 2023-02-20 ENCOUNTER — Telehealth: Payer: Self-pay | Admitting: Physician Assistant

## 2023-02-20 DIAGNOSIS — E559 Vitamin D deficiency, unspecified: Secondary | ICD-10-CM | POA: Diagnosis not present

## 2023-02-20 DIAGNOSIS — D513 Other dietary vitamin B12 deficiency anemia: Secondary | ICD-10-CM | POA: Diagnosis not present

## 2023-02-20 DIAGNOSIS — G609 Hereditary and idiopathic neuropathy, unspecified: Secondary | ICD-10-CM | POA: Diagnosis not present

## 2023-02-20 DIAGNOSIS — R748 Abnormal levels of other serum enzymes: Secondary | ICD-10-CM | POA: Diagnosis not present

## 2023-02-20 NOTE — Telephone Encounter (Signed)
Next visit is 03/15/23. Requesting refill on Clonazepam called to:  [id number] - [place], [place] - [address]   Phone: [phone]  Fax: [phone]

## 2023-02-21 ENCOUNTER — Ambulatory Visit: Payer: Medicare PPO | Admitting: Psychiatry

## 2023-02-21 DIAGNOSIS — F4323 Adjustment disorder with mixed anxiety and depressed mood: Secondary | ICD-10-CM | POA: Diagnosis not present

## 2023-02-21 NOTE — Progress Notes (Signed)
[facility] Counselor/Therapist Progress Note  Patient ID: Angel Maddox, MRN: [id number],    Date: 02/21/2023  Time Spent: 55 minutes   Treatment Type: Individual Therapy  Reported Symptoms: anxiety, conflict within family, tearfulness, not as impulsive, some depression, communication issues within family   Mental Status Exam:  Appearance:   Well Groomed     Behavior:  Appropriate, Sharing, and Motivated  Motor:  Normal; uses brace left leg due to "drop foot and claw toes"  Speech/Language:   Clear and Coherent  Affect:  Anxious, depressed  Mood:  anxious and depressed  Thought process:  goal directed  Thought content:    Some obsessiveness but has been working on this and is showing decrease  Sensory/Perceptual disturbances:    WNL  Orientation:  oriented to person, place, time/date, situation, day of week, month of year, year, and stated date of Aug. 27, 2024  Attention:  Good  Concentration:  Good  Memory:  WNL  Fund of knowledge:   Good  Insight:    Good and Fair  Judgment:   Good  Impulse Control:  Good, Fair, and improving!    Risk Assessment: Danger to Self:  No Self-injurious Behavior: No Danger to Others: No Duty to Warn:no Physical Aggression / Violence:No  Access to Firearms a concern: No  Gang Involvement:No   Subjective: Patient in today and we first reviewed some of her progress including better impulse control, decreasing anxiety, and improved self-esteem. Getting better and not replying to negative texts from another family member (D), and some improvement in patience. Continues working today on impulse control issues, setting limits, and communication within family. Patient showing more effort, some improved insight into her own behaviors and behavior of others especially within the family. Trying to focus on more positives versus negatives as she wants to be a better "role-model" for grandchildren. Definitely showing more consistent effort on tx  goal behaviors even in the midst of struggles and obstacles. Less tearfulness. Issues continue in relationship with daughter. Relationship with daughter remains challenging. Boundaries emphasized and reviewed.   Interventions: Cognitive Behavioral Therapy and Ego-Supportive  Long term goal: Develop the ability to recognize, accept, and cope with feelings of depression and anxiety. Short term goal: Learn and implement calming skills to reduce overall tension and moments of increased anxiety and/or depression. Patient will recognize her negative self-talk, interrupt it, and not let it prevent her from trying new strategies to help herself. Strategy: Teach patient cognitive and somatic calming skills .  Rehearse with patient how to apply these skills to her daily life.  Review and reinforce success while providing corrective feedback toward consistent implementation.  Diagnosis:   ICD-10-CM   1. Situational mixed anxiety and depressive disorder  F43.23      Plan: Patient today continues to show more focus on her goals and building on her progress. Needs to continue working with goal-directed behaviors to move in a forward direction. Continues improvement in self-awareness and choosing how to best respond in some situations as this is a work in progress.  Reminded and encouraged patient and practicing healthier and self affirming behaviors including: Understand that some of her anxious thoughts are "just thoughts" and are not necessarily going to play out in reality the way she imagines, stay in the present and focused on what she can change, believing in herself more, remain in contact with supportive people, practice "the pause" as needed, refrain from assuming negatives and worst-case scenarios, use of positive  self talk, practice more intentional listening to others, reduce overthinking and over analyzing, challenge and counteract self-doubt, continue her work on letting go of things including guilt  from the past that hold her back now, be more mindful when texting and messaging other people especially during times when she is struggling with anger, and realize the strength she shows working with goal-directed behaviors to move in a direction that supports her increased confidence, improved emotional health, and outlook.  Goal review and progress/challenges noted with patient.  Next appointment within 2 to 3 weeks.   Mathis Fare, LCSW

## 2023-02-22 ENCOUNTER — Other Ambulatory Visit: Payer: Self-pay | Admitting: Physician Assistant

## 2023-02-28 ENCOUNTER — Other Ambulatory Visit: Payer: Self-pay

## 2023-02-28 ENCOUNTER — Telehealth: Payer: Self-pay | Admitting: Physician Assistant

## 2023-02-28 NOTE — Telephone Encounter (Signed)
Pended.

## 2023-02-28 NOTE — Telephone Encounter (Signed)
Patient rtc needs prescription sent ASAP. Ph: [phone]

## 2023-02-28 NOTE — Telephone Encounter (Signed)
LF 6/27. Left message, when does Rx need to be sent to [facility]?

## 2023-02-28 NOTE — Telephone Encounter (Signed)
Pt is needing a RF on Klonopin. Will be going out of town to [facility] for a couple weeks and needs a refill while in [place]. [facility] [address]  [place], [place] fax: [phone]

## 2023-03-01 ENCOUNTER — Ambulatory Visit: Payer: Medicare PPO | Admitting: Physician Assistant

## 2023-03-01 MED ORDER — CLONAZEPAM 1 MG PO TABS: 1.0000 mg | ORAL_TABLET | Freq: Three times a day (TID) | ORAL | 0 refills | Status: DC

## 2023-03-14 DIAGNOSIS — R0989 Other specified symptoms and signs involving the circulatory and respiratory systems: Secondary | ICD-10-CM | POA: Diagnosis not present

## 2023-03-14 DIAGNOSIS — R0982 Postnasal drip: Secondary | ICD-10-CM | POA: Diagnosis not present

## 2023-03-14 DIAGNOSIS — J3489 Other specified disorders of nose and nasal sinuses: Secondary | ICD-10-CM | POA: Diagnosis not present

## 2023-03-14 DIAGNOSIS — J300 Vasomotor rhinitis: Secondary | ICD-10-CM | POA: Diagnosis not present

## 2023-03-14 DIAGNOSIS — M950 Acquired deformity of nose: Secondary | ICD-10-CM | POA: Diagnosis not present

## 2023-03-14 DIAGNOSIS — R0683 Snoring: Secondary | ICD-10-CM | POA: Diagnosis not present

## 2023-03-15 ENCOUNTER — Other Ambulatory Visit: Payer: Self-pay | Admitting: Neurology

## 2023-03-15 ENCOUNTER — Ambulatory Visit (INDEPENDENT_AMBULATORY_CARE_PROVIDER_SITE_OTHER): Payer: Medicare PPO | Admitting: Physician Assistant

## 2023-03-15 ENCOUNTER — Encounter: Payer: Self-pay | Admitting: Physician Assistant

## 2023-03-15 DIAGNOSIS — Z638 Other specified problems related to primary support group: Secondary | ICD-10-CM | POA: Diagnosis not present

## 2023-03-15 DIAGNOSIS — G4700 Insomnia, unspecified: Secondary | ICD-10-CM | POA: Diagnosis not present

## 2023-03-15 DIAGNOSIS — F4323 Adjustment disorder with mixed anxiety and depressed mood: Secondary | ICD-10-CM | POA: Diagnosis not present

## 2023-03-15 DIAGNOSIS — F411 Generalized anxiety disorder: Secondary | ICD-10-CM

## 2023-03-15 MED ORDER — CLONAZEPAM 1 MG PO TABS: 1.0000 mg | ORAL_TABLET | Freq: Three times a day (TID) | ORAL | 3 refills | Status: DC

## 2023-03-15 MED ORDER — MIRTAZAPINE 15 MG PO TABS: 15.0000 mg | ORAL_TABLET | Freq: Every evening | ORAL | 1 refills | Status: DC | PRN

## 2023-03-15 NOTE — Telephone Encounter (Signed)
Last seen on 11/17/22 No follow up scheduled Based on encounter it appears pt has stopped Rx

## 2023-03-15 NOTE — Progress Notes (Signed)
[facility] Med Check  Patient ID: Angel Maddox,  MRN: [id number]  PCP: Kirstie Peri, MD  Date of Evaluation: 03/15/2023  Time spent:25 minutes  HISTORY/CURRENT STATUS: For routine 2 month med check.    Still under a lot of stress d/t fm issues. Her grandchildren's other GM, Raynelle Fanning, is causing problems again. Patient's son, their dad, died approx 10 years ago, and Raynelle Fanning won't let her see the kids.  Is more anxious and needs the Klonopin more than usual. H keeps the med and gives her when needed.  She cries a lot but has no feelings of hopelessness.  Sleeps well most of the time. ADLs and personal hygiene are normal.   Denies any changes in concentration, making decisions, or remembering things.  Appetite has not changed.  Weight is stable.  Denies suicidal or homicidal thoughts.  Patient denies increased energy with decreased need for sleep, increased talkativeness, racing thoughts, impulsivity or risky behaviors, increased spending, increased libido, grandiosity, increased irritability or anger, paranoia, or hallucinations.  Has foot brace on left side. Walks slowly d/t MS but doesn't need assistant.  Denies dizziness, syncope, seizures, numbness, tingling, tremor, tics, unsteady gait, slurred speech, confusion.   Individual Medical History/ Review of Systems: Changes? :Yes  had nerve conduction test w/ neuro recently    Past medications for mental health diagnoses include: Depakote, Lamictal caused insomnia, constipation, and tremor, Risperdal, Celexa, Klonopin, trazodone, Wellbutrin, Vistaril, primidone, gabapentin, Prozac, Zyprexa, Ambien, Lunesta, Effexor  Allergies: Hydrocodone  Current Medications:  Current Outpatient Medications:    amLODipine (NORVASC) 10 MG tablet, Take 1 tablet (10 mg total) by mouth daily., Disp: 90 tablet, Rfl: 1   baclofen (LIORESAL) 10 MG tablet, Take 10 mg by mouth 2 (two) times daily. Takes one in the morning and one at noon, Disp: , Rfl:     buPROPion (WELLBUTRIN SR) 200 MG 12 hr tablet, Take 1 tablet (200 mg total) by mouth 2 (two) times daily., Disp: 60 tablet, Rfl: 11   citalopram (CELEXA) 40 MG tablet, Take 1 tablet (40 mg total) by mouth daily., Disp: 30 tablet, Rfl: 11   dalfampridine 10 MG TB12, 2 (two) times daily., Disp: , Rfl:    dicyclomine (BENTYL) 10 MG capsule, TAKE 1 CAPSULE (10 MG TOTAL) BY MOUTH EVERY 8 (EIGHT) HOURS AS NEEDED FOR SPASMS., Disp: 30 capsule, Rfl: 1   losartan (COZAAR) 25 MG tablet, TAKE 1 TABLET BY MOUTH EVERY DAY, Disp: 90 tablet, Rfl: 1   omeprazole (PRILOSEC) 40 MG capsule, Take 1 capsule (40 mg total) by mouth daily. Please keep your Nov appointment for further refills. Thank you, Disp: 90 capsule, Rfl: 0   primidone (MYSOLINE) 250 MG tablet, Take 250 mg by mouth 3 (three) times daily., Disp: , Rfl:    tiZANidine (ZANAFLEX) 4 MG capsule, Take 4 mg by mouth at bedtime., Disp: , Rfl:    clonazePAM (KLONOPIN) 1 MG tablet, Take 1 tablet (1 mg total) by mouth 3 (three) times daily., Disp: 90 tablet, Rfl: 3   gabapentin (NEURONTIN) 300 MG capsule, Take 2 capsules (600 mg total) by mouth 3 (three) times daily., Disp: 180 capsule, Rfl: 0   mirtazapine (REMERON) 15 MG tablet, Take 1 tablet (15 mg total) by mouth at bedtime as needed., Disp: 90 tablet, Rfl: 1   Ofatumumab (KESIMPTA) 20 MG/0.4ML SOAJ, Inject 20 mg into the skin every 30 (thirty) days. (Patient not taking: Reported on 03/15/2023), Disp: , Rfl:    ondansetron (ZOFRAN-ODT) 4 MG disintegrating tablet, TAKE 1  TABLET BY MOUTH EVERY 8 HOURS AS NEEDED FOR NAUSEA AND VOMITING (Patient not taking: Reported on 02/15/2022), Disp: 30 tablet, Rfl: 2   Oxycodone HCl 10 MG TABS, Take 10 mg by mouth 3 (three) times daily as needed. (Patient not taking: Reported on 03/15/2023), Disp: , Rfl:    psyllium (METAMUCIL) 58.6 % powder, Take 1 packet by mouth daily. (Patient not taking: Reported on 11/17/2022), Disp: , Rfl:  Medication Side Effects: none  Family Medical/  Social History: Changes? No   MENTAL HEALTH EXAM:  There were no vitals taken for this visit.There is no height or weight on file to calculate BMI.  General Appearance: Casual, Neat and Well Groomed  Eye Contact:  Good  Speech:  Clear and Coherent and Normal Rate  Volume:  Normal  Mood:  Depressed and Hopeless  Affect:  Depressed and Tearful  Thought Process:  Goal Directed and Descriptions of Associations: Circumstantial  Orientation:  Full (Time, Place, and Person)  Thought Content: Logical   Suicidal Thoughts:  No  Homicidal Thoughts:  No  Memory:  WNL  Judgement:  Good  Insight:  Good  Psychomotor Activity:   walks slowly, has brace on left lower leg  Concentration:  Concentration: Good  Recall:  Good  Fund of Knowledge: Good  Language: Good  Assets:  Desire for Improvement Financial Resources/Insurance Housing Resilience Transportation  ADLs nl  Cognition: WNL  Prognosis:  Good   DIAGNOSES:    ICD-10-CM   1. Generalized anxiety disorder  F41.1     2. Situational mixed anxiety and depressive disorder  F43.23     3. Insomnia, unspecified type  G47.00     4. Stress due to family tension  Z63.8       Receiving Psychotherapy: Yes  Rockne Menghini, LCSW   RECOMMENDATIONS:  PDMP was reviewed.  Klonopin filled 03/01/2023.  Gabapentin filled 02/14/2023. I provided 25 minutes of face to face time during this encounter, including time spent before and after the visit in records review, medical decision making, counseling pertinent to today's visit, and charting.   As far as her medications are concerned she is stable so no changes will be made.  We discussed the Klonopin.  She understands to take it as little as possible, and have her husband keep it and give it to her when needed.  Continue Wellbutrin SR 200 mg, 1 p.o. twice daily. Continue Celexa 40 mg, 1 p.o. daily. Continue Klonopin 1 mg, 1 po tid prn. Continue Gabapentin 300 mg, 2 po tid per another provider. Continue  Mirtazapine 15 mg, 1/2-1 po at bedtime prn sleep.  Continue therapy with Rockne Menghini, LCSW.  Return in 4 months.  Melony Overly, PA-C

## 2023-03-16 ENCOUNTER — Ambulatory Visit: Payer: Medicare PPO | Admitting: Psychiatry

## 2023-03-19 ENCOUNTER — Other Ambulatory Visit: Payer: Self-pay | Admitting: Physician Assistant

## 2023-03-21 ENCOUNTER — Ambulatory Visit: Payer: Medicare PPO | Admitting: Psychiatry

## 2023-03-23 ENCOUNTER — Ambulatory Visit: Payer: Medicare PPO | Admitting: Neurology

## 2023-04-07 DIAGNOSIS — R4182 Altered mental status, unspecified: Secondary | ICD-10-CM | POA: Diagnosis not present

## 2023-04-07 DIAGNOSIS — R569 Unspecified convulsions: Secondary | ICD-10-CM | POA: Diagnosis not present

## 2023-04-11 ENCOUNTER — Ambulatory Visit (INDEPENDENT_AMBULATORY_CARE_PROVIDER_SITE_OTHER): Payer: Medicare PPO | Admitting: Psychiatry

## 2023-04-11 DIAGNOSIS — F411 Generalized anxiety disorder: Secondary | ICD-10-CM | POA: Diagnosis not present

## 2023-04-11 NOTE — Progress Notes (Signed)
[facility] Counselor/Therapist Progress Note  Patient ID: Angel Maddox, MRN: [id number],    Date: 04/11/2023  Time Spent: 53 minutes  Treatment Type: Individual Therapy  Reported Symptoms: anxiety "higher", depression "not as bad as last week but still present", tearfulness   Mental Status Exam:  Appearance:   Casual     Behavior:  Appropriate, Sharing, and Motivated  Motor:  Some stiffness in walking  Speech/Language:   Clear and Coherent  Affect:  Depressed and anxious  Mood:  anxious and depressed  Thought process:  goal directed  Thought content:    Some obsessive thoughts  Sensory/Perceptual disturbances:    WNL  Orientation:  oriented to person, place, time/date, situation, day of week, month of year, year, and stated date of Oct. 15, 2014  Attention:  Fair  Concentration:  Good and Fair  Memory:  Some short term memory issues  Fund of knowledge:   Good and Fair  Insight:    Fair  Judgment:   Good  Impulse Control:  Good and Fair   Risk Assessment: Danger to Self:  No Self-injurious Behavior: No Danger to Others: No Duty to Warn:no Physical Aggression / Violence:No  Access to Firearms a concern: No  Gang Involvement:No   Subjective:  Patient in today reporting anxiety, depression, and tearfulness regarding multiple stressors and situations. Today initially very tearful and more loudly voicing her concerns about other grandmother and their grandchildren. Patient has very difficult time in letting go of stress and conflict with other grandmother of their grandchildren. Lots of anger, frustration, dramatic interactions, and difficulty letting go in order to move forward. Today focused with patient on her anger, resentment, anxiety, and depression related to her relationship with other grandmother, and how to work more on "letting go". Worked on her impulsive thoughts strongly related to her anger and frustration with other set of grandparents in midst of  relationship issues with patient's grandchildren who are in the physical custody of the children's other grandmother. Patient did gradually calm down more and became more rational. Working on trying to let go of negatives, not be so consumed with her anger, be more willing to work on her anger in healthier ways that can make a big difference in how she copes with things out of her control especially within the family.  Interventions: Cognitive Behavioral Therapy and Ego-Supportive  Long term goal: Develop the ability to recognize, accept, and cope with feelings of depression and anxiety. Short term goal: Learn and implement calming skills to reduce overall tension and moments of increased anxiety and/or depression. Patient will recognize her negative self-talk, interrupt it, and not let it prevent her from trying new strategies to help herself. Strategy: Teach patient cognitive and somatic calming skills .  Rehearse with patient how to apply these skills to her daily life.  Review and reinforce success while providing corrective feedback toward consistent implementation.  Diagnosis:   ICD-10-CM   1. Generalized anxiety disorder  F41.1      Plan:   Patient in session today showing good motivation and participation working further on on her anxiety, depression, and difficulty with family conflicts and anger.  She has made some progress and needs to continue her work with goal-directed behaviors in order to move in a healthier and more hopeful direction. Reminded and encouraged patient to be practicing healthier and self affirming behaviors including: Understanding that some of her anxious thoughts are "just thoughts" and are not necessarily going to play  out in reality the way she imagines, remain in the present and focused on what she can change, believing herself more, remain in contact with supportive people, practice "the pause" as needed, refrain from assuming negatives and worst-case scenarios,  use of positive self talk, reduce overthinking and over analyzing, challenge and counteract self-doubt, continue her work on letting go of things including guilt from the past, being more mindful when texting and messaging other people especially during times when she is struggling with anger, and recognize the strength she shows working with goal-directed behaviors to move in a direction that supports her increased confidence, improved emotional health, and overall wellbeing.  Goal review and progress/challenges noted with patient.  Next appointment within 2 to 3 weeks.   Mathis Fare, LCSW

## 2023-04-14 ENCOUNTER — Other Ambulatory Visit: Payer: Self-pay | Admitting: Gastroenterology

## 2023-04-14 DIAGNOSIS — R1013 Epigastric pain: Secondary | ICD-10-CM

## 2023-04-27 ENCOUNTER — Ambulatory Visit (INDEPENDENT_AMBULATORY_CARE_PROVIDER_SITE_OTHER): Payer: Medicare PPO | Admitting: Psychiatry

## 2023-04-27 DIAGNOSIS — F411 Generalized anxiety disorder: Secondary | ICD-10-CM

## 2023-04-27 NOTE — Progress Notes (Signed)
[facility] Counselor/Therapist Progress Note  Patient ID: Angel Maddox, MRN: [id number],    Date: 04/27/2023  Time Spent: 55 minutes   Treatment Type: Individual Therapy  Reported Symptoms: anxiety, depression, tearfulness "not quite as bad"   Mental Status Exam:  Appearance:   Casual     Behavior:  Appropriate, Sharing, and Motivated  Motor:  Normal  Speech/Language:   Clear and Coherent  Affect:  Depressed and anxious  Mood:  anxious, depressed, and irritable  Thought process:  goal directed  Thought content:    Rumination  Sensory/Perceptual disturbances:    WNL  Orientation:  oriented to person, place, time/date, situation, day of week, month of year, year, and stated date of Oct. 31, 2024  Attention:  Fair  Concentration:  Fair  Memory:  WNL  Fund of knowledge:   Good  Insight:    Fair  Judgment:   Good and Fair  Impulse Control:  Good and Fair   Risk Assessment: Danger to Self:  No Self-injurious Behavior: No Danger to Others: No Duty to Warn:no Physical Aggression / Violence:No  Access to Firearms a concern: No  Gang Involvement:No   Subjective: Patient in session reporting depression, anxiety, and some tearfulness related to multiple family stressors.  Patient continues having difficulty in relationship to 2 of her grandchildren, and their "other grandmother". Very tearful and emotional today in session and needed the time to talk through and share her emotions.  Dramatic interactions within the family and patient finds "letting go" very difficult.  Shared that last night was difficult and "I did drink some wine to calm me down."  Reports that her drinking is very limited.  Processed a lot of anxiety, some anger and resentment, and depression.  Discussed the need of "letting go" in regards to some of her anger and resentment as that actually helps to feed her depression, and helped her understand this in the context of using several examples.  Tearfulness  increased for a while. Denies any SI. Having to accept that a lot of things within the family are out of her control, and that acceptance is very difficult for patient, although necessary in order for her to move forward.  She does acknowledge that there may be more hope as the grandchildren get more "of age" and out on their own. Some progress, although difficult.  Continues to have a lot of anger and resentment, frustration, dramatic interactions, and difficulty letting go, although is showing more effort at times.  Working further on anger and resentment especially rather than holding onto it, which is very difficult for patient also but does show some efforts being made.  Was calmer by end of session focusing more on her need to "let go of things out of my control, and also acknowledging how difficult that can be but feel she has made some progress".  Interventions: Cognitive Behavioral Therapy and Ego-Supportive  Long term goal: Develop the ability to recognize, accept, and cope with feelings of depression and anxiety. Short term goal: Learn and implement calming skills to reduce overall tension and moments of increased anxiety and/or depression. Patient will recognize her negative self-talk, interrupt it, and not let it prevent her from trying new strategies to help herself. Strategy: Teach patient cognitive and somatic calming skills .  Rehearse with patient how to apply these skills to her daily life.  Review and reinforce success while providing corrective feedback toward consistent implementation.  Diagnosis:   ICD-10-CM  1. Generalized anxiety disorder  F41.1      Plan:  Patient today motivated and working further on her depression, personal and family conflicts, anxiety, and frustrations.  She has made progress and agrees that she needs to continue her work with goal-directed behaviors in order to move in a healthier and more hopeful direction. Reminded and encouraged patient to be  practicing healthier and self affirming behaviors including: Understand that some of her anxious thoughts are "just thoughts" and are not necessarily going to play out in reality the way she imagines.  Remain in the present focused on what she can change, believe more in herself, remain in contact with supportive people, practice "the pause" as needed, refrain from assuming negative some worst-case scenarios, positive self talk, reduce overthinking and over analyzing, challenge and counteract self-doubt, continue her work on letting go of things including guilt from the past, being more mindful when texting or messaging other people especially during times when she is struggling with anger, and realize the strength she shows working with goal-directed behaviors to move in a direction that supports her increased confidence, improved emotional health, and overall wellbeing.  Goal review and progress/challenges noted with patient.  Next appointment within 3 weeks.   Mathis Fare, LCSW

## 2023-05-03 ENCOUNTER — Ambulatory Visit: Payer: Medicare PPO | Admitting: Psychiatry

## 2023-05-03 DIAGNOSIS — R2689 Other abnormalities of gait and mobility: Secondary | ICD-10-CM | POA: Diagnosis not present

## 2023-05-03 DIAGNOSIS — F339 Major depressive disorder, recurrent, unspecified: Secondary | ICD-10-CM | POA: Diagnosis not present

## 2023-05-09 ENCOUNTER — Ambulatory Visit: Payer: Medicare PPO | Admitting: Gastroenterology

## 2023-05-09 ENCOUNTER — Encounter: Payer: Self-pay | Admitting: Gastroenterology

## 2023-05-09 VITALS — BP 122/70 | HR 71 | Ht 66.0 in | Wt 141.0 lb

## 2023-05-09 DIAGNOSIS — K219 Gastro-esophageal reflux disease without esophagitis: Secondary | ICD-10-CM | POA: Diagnosis not present

## 2023-05-09 DIAGNOSIS — R1013 Epigastric pain: Secondary | ICD-10-CM | POA: Diagnosis not present

## 2023-05-09 DIAGNOSIS — Z79899 Other long term (current) drug therapy: Secondary | ICD-10-CM

## 2023-05-09 DIAGNOSIS — K5900 Constipation, unspecified: Secondary | ICD-10-CM | POA: Diagnosis not present

## 2023-05-09 DIAGNOSIS — R140 Abdominal distension (gaseous): Secondary | ICD-10-CM

## 2023-05-09 MED ORDER — LINACLOTIDE 145 MCG PO CAPS: 145.0000 ug | ORAL_CAPSULE | Freq: Every day | ORAL | 0 refills | Status: DC

## 2023-05-09 MED ORDER — OMEPRAZOLE 40 MG PO CPDR: 40.0000 mg | DELAYED_RELEASE_CAPSULE | Freq: Every day | ORAL | 3 refills | Status: DC

## 2023-05-09 NOTE — Patient Instructions (Addendum)
Continue omeprazole.  We are sending a year's prescription to your pharmacy for you to pick up at your convenience.  We have sent the following medications to your pharmacy for you to pick up at your convenience: Linzess 145 mcg: Take once daily 30 minutes before a meal  We are giving you a Low-FODMAP diet handout today. FODMAPs are short-chain carbohydrates (sugars) that are highly fermentable, which means that they go through chemical changes in the GI system, and are poorly absorbed during digestion. When FODMAPs reach the colon (large intestine), bacteria ferment these sugars, turning them into gas and chemicals. This stretches the walls of the colon, causing abdominal bloating, distension, cramping, pain, and/or changes in bowel habits in many patients with IBS. FODMAPs are not unhealthy or harmful, but may exacerbate GI symptoms in those with sensitive GI tracts.  We are giving you a handout regarding regarding FODZYMES.   Thank you for entrusting me with your care and for choosing [facility], Dr. Ileene Patrick    If your blood pressure at your visit was 140/90 or greater, please contact your primary care physician to follow up on this. ______________________________________________________  If you are age 67 or older, your body mass index should be between 23-30. Your Body mass index is 22.76 kg/m. If this is out of the aforementioned range listed, please consider follow up with your Primary Care Provider.  If you are age 67 or younger, your body mass index should be between 19-25. Your Body mass index is 22.76 kg/m. If this is out of the aformentioned range listed, please consider follow up with your Primary Care Provider.  ________________________________________________________  The [facility] providers would like to encourage you to use [facility] to communicate with providers for non-urgent requests or questions.  Due to long hold times on the telephone, sending your  provider a message by [facility] may be a faster and more efficient way to get a response.  Please allow 48 business hours for a response.  Please remember that this is for non-urgent requests.  _______________________________________________________  Due to recent changes in healthcare laws, you may see the results of your imaging and laboratory studies on MyChart before your provider has had a chance to review them.  We understand that in some cases there may be results that are confusing or concerning to you. Not all laboratory results come back in the same time frame and the provider may be waiting for multiple results in order to interpret others.  Please give [place] 48 hours in order for your provider to thoroughly review all the results before contacting the office for clarification of your results.

## 2023-05-09 NOTE — Progress Notes (Signed)
HPI :  67 year old female here for follow-up visit.  Recall she has a history of GERD on chronic PPI, history of osteoporosis.  She has been maintained on omeprazole 40 mg daily.  If she takes this every day her symptoms of reflux are very well-controlled.  At the time of her last visit I asked her to try to taper down to a lower dosing of 20 mg/day.  She states she did not tolerate that well at all and had significant breakthrough symptoms so she has been maintained on 40 mg daily.  She rarely does not have breakthrough on this regimen but if she does she may take a Pepcid here or there.  She denies any dysphagia at present, she has had that in the past with empiric dilation remotely.  Main symptom that tends to bother her has been abdominal gas and bloating that can bother her.  Can come and go.  She is not aware of any clear trigger foods although has been eating a lot of fruits and vegetables as she works with the nutritionist.  She is tried some Gas-X which has not helped.  She has been on the constipated side lately.  Took some Metamucil which really did not help.  She tried using MiraLAX anywhere from once to twice daily and states she did not have adequate relief with that.  Currently she is taking Colace as needed over-the-counter.  She still has some constipation that bothers her from time to time.  She states her DEXA scan is up-to-date and she is being followed by her primary care for that.  She is having a physical in December where she is having her yearly labs.   Prior workup: Colonoscopy 04/17/2017 - Tortuous colon. - One 6 mm polyp in the sigmoid colon, removed with a cold snare. Resected and retrieved. - adenoma - Internal hemorrhoids. - The examination was otherwise normal.   EGD 2008, normal     EGD 01/24/2019 -  - A 1 cm hiatal hernia was present. - The exam of the esophagus was otherwise normal. No stenosis / stricture appreciated. - A guidewire was placed and the scope  was withdrawn. Empiric dilation was performed in the entire esophagus with a Savary dilator with mild resistance at 17 mm. Relook endoscopy showed no mucosal wrents. Biopsies were taken with a cold forceps in the upper third of the esophagus, in the middle third of the esophagus and in the lower third of the esophagus for histology. - One non-bleeding superficial clean based gastric ulcer with no stigmata of bleeding was found at the pylorus. The lesion was 3 mm in largest dimension. - Patchy moderate inflammation characterized by erosions, erythema and friability was found in the gastric antrum. - The exam of the stomach was otherwise normal. - Biopsies were taken with a cold forceps in the gastric body, at the incisura and in the gastric antrum for Helicobacter pylori testing. - The duodenal bulb and second portion of the duodenum were normal.     1. Surgical [P], gastric antrum and gastric body - MILD REACTIVE GASTROPATHY. [place] IS NEGATIVE FOR HELICOBACTER PYLORI. - NO INTESTINAL METAPLASIA, DYSPLASIA, OR MALIGNANCY. 2. Surgical [P], distal, mid, proximal esophagus - BENIGN SQUAMOUS MUCOSA. - NO INCREASE IN INTRAEPITHELIAL EOSINOPHILS. - NO INTESTINAL METAPLASIA, DYSPLASIA, OR MALIGNANCY.    Past Medical History:  Diagnosis Date   Anxiety    Arthritis    osteo   Atypical mole 08/10/2021   Right Abdomen (side) upper (  severe)   Bipolar 1 disorder (HCC)    Cervicalgia    Chronic kidney disease    kidney stone   COPD (chronic obstructive pulmonary disease) (HCC)    new diagnosis- now sees pulmonologist   Depression    Deviated septum    Elevated liver enzymes    She says "normal now"   GERD (gastroesophageal reflux disease)    H/O hiatal hernia    Headache    Hip fracture (HCC) 2017   left   History of kidney stones    4-5 yrs ago   Hypertension    Multiple sclerosis (HCC)     Had for 15 years   Multiple sclerosis (HCC)    dx 1999   Tremors of nervous  system      Past Surgical History:  Procedure Laterality Date   ANTERIOR CERVICAL DECOMP/DISCECTOMY FUSION N/A 07/28/2017   Procedure: CERVICAL FOUR-FIVE, CERVICAL FIVE-SIX ANTERIOR CERVICAL DECOMPRESSION/DISCECTOMY FUSION;  Surgeon: Tia Alert, MD;  [facility];  Service: Neurosurgery;  Laterality: N/A;   COLONOSCOPY  2008   Dr.Kaplan   EYE SURGERY     [facility]   HIP PINNING,CANNULATED Left 11/09/2015   Procedure: CANNULATED HIP PINNING;  Surgeon: Samson Frederic, MD;  Location: [facility];  Service: Orthopedics;  Laterality: Left;   NASAL SEPTOPLASTY W/ TURBINOPLASTY Bilateral 01/12/2021   Procedure: NASAL SEPTOPLASTY WITH BILATERAL  TURBINATE REDUCTION;  Surgeon: Preyer Pies, MD;  Location: [facility];  Service: ENT;  Laterality: Bilateral;   TUBAL LIGATION     Family History  Problem Relation Age of Onset   Heart attack Mother    Breast cancer Maternal Aunt    Colon cancer Neg Hx    Stomach cancer Neg Hx    Esophageal cancer Neg Hx    Rectal cancer Neg Hx    Social History   Tobacco Use   Smoking status: Former    Current packs/day: 0.00    Average packs/day: 0.3 packs/day for 15.0 years (3.8 ttl pk-yrs)    Types: Cigarettes    Start date: 01/25/1976    Quit date: 01/25/1991    Years since quitting: 32.3   Smokeless tobacco: Never   Tobacco comments:    NO SMOKING at All  Vaping Use   Vaping status: Never Used  Substance Use Topics   Alcohol use: No   Drug use: No   Current Outpatient Medications  Medication Sig Dispense Refill   amLODipine (NORVASC) 10 MG tablet Take 1 tablet (10 mg total) by mouth daily. 90 tablet 1   baclofen (LIORESAL) 10 MG tablet Take 10 mg by mouth 2 (two) times daily. Takes one in the morning and one at noon     buPROPion (WELLBUTRIN SR) 200 MG 12 hr tablet Take 1 tablet (200 mg total) by mouth 2 (two) times daily. 60 tablet 11   citalopram (CELEXA) 40 MG tablet Take 1 tablet (40 mg total) by mouth daily.  30 tablet 11   clonazePAM (KLONOPIN) 1 MG tablet Take 1 tablet (1 mg total) by mouth 3 (three) times daily. 90 tablet 3   dalfampridine 10 MG TB12 2 (two) times daily.     dicyclomine (BENTYL) 10 MG capsule TAKE 1 CAPSULE (10 MG TOTAL) BY MOUTH EVERY 8 (EIGHT) HOURS AS NEEDED FOR SPASMS. 30 capsule 1   losartan (COZAAR) 25 MG tablet TAKE 1 TABLET BY MOUTH EVERY DAY 90 tablet 1   mirtazapine (REMERON) 15 MG tablet Take 1 tablet (15  mg total) by mouth at bedtime as needed. 90 tablet 1   omeprazole (PRILOSEC) 40 MG capsule TAKE 1 CAPSULE BY MOUTH DAILY. PLEASE KEEP YOUR NOV APPOINTMENT FOR FURTHER REFILLS. 90 capsule 0   ondansetron (ZOFRAN-ODT) 4 MG disintegrating tablet TAKE 1 TABLET BY MOUTH EVERY 8 HOURS AS NEEDED FOR NAUSEA AND VOMITING 30 tablet 2   primidone (MYSOLINE) 250 MG tablet Take 250 mg by mouth 3 (three) times daily.     gabapentin (NEURONTIN) 300 MG capsule Take 2 capsules (600 mg total) by mouth 3 (three) times daily. 180 capsule 0   Ofatumumab (KESIMPTA) 20 MG/0.4ML SOAJ Inject 20 mg into the skin every 30 (thirty) days. (Patient not taking: Reported on 05/09/2023)     Oxycodone HCl 10 MG TABS Take 10 mg by mouth 3 (three) times daily as needed. (Patient not taking: Reported on 05/09/2023)     psyllium (METAMUCIL) 58.6 % powder Take 1 packet by mouth daily. (Patient not taking: Reported on 05/09/2023)     tiZANidine (ZANAFLEX) 4 MG capsule Take 4 mg by mouth at bedtime. (Patient not taking: Reported on 05/09/2023)     No current facility-administered medications for this visit.   Allergies  Allergen Reactions   Hydrocodone Itching     Review of Systems: All systems reviewed and negative except where noted in HPI.   No recent labs on file  Physical Exam: BP 122/70   Pulse 71   Ht 5\' 6"  (1.676 m)   Wt 141 lb (64 kg)   BMI 22.76 kg/m  Constitutional: Pleasant,well-developed, female in no acute distress. Neurological: Alert and oriented to person place and  time. Psychiatric: Normal mood and affect. Behavior is normal.   ASSESSMENT: 67 y.o. female here for assessment of the following  1. Gastroesophageal reflux disease, unspecified whether esophagitis present   2. Long-term current use of proton pump inhibitor therapy   3. Bloating   4. Constipation, unspecified constipation type    On longstanding PPI, she has not tolerated reduction in dosing of omeprazole since our last visit.  We have discussed long-term risks of chronic PPI use and she understands this, especially with her bone health she is at increased risk for bone fracture.  That being said she has significant reflux symptoms that bother her if she does not take 40 mg daily of omeprazole.  She wishes to continue the present dosing for now.  She will continue to see her primary care for maintenance of her bone health and management of osteoporosis.  EGD without Barrett's.  Intermittent gas and bloating that has been bothering her.  She has had some intermittent constipation over time and certainly want her moving her bowels regularly to try to minimize bloating associated with that.  Sounds like she has failed fiber supplement, MiraLAX, stool softener.  I offered her trial of Linzess 145 mcg/day, 30-day trial given.  Counseled her if this is too strong or not strong enough she should let me know.  She has had some recent dietary change and perhaps that could be related to gas and bloating as well.  Provided her low FODMAP diet handout just to see if she is eating any trigger foods that could contribute to gas and bloating as well as handout about Blima Singer supplement which may be helpful for the symptoms as well.  Otherwise she will be seeing her PCP soon for yearly labs.  I would like to see her yearly if not sooner with any issues.  She agrees.  PLAN: - continue omeprazole at 40mg  / day - did not tolerate reduction in dosing, discussed long term risks / benefits and she understands. See  PCP about osteoporosis. - low FODMAP diet handout - handout about Blima Singer supplement for gas / bloating - samples of Linzess / day - no samples available today, gave 30 day trial prescription - getting yearly labs with physical in December - f/u yearly if not sooner with issues   Harlin Rain, MD [facility]

## 2023-05-16 DIAGNOSIS — R0683 Snoring: Secondary | ICD-10-CM | POA: Diagnosis not present

## 2023-05-16 DIAGNOSIS — J300 Vasomotor rhinitis: Secondary | ICD-10-CM | POA: Diagnosis not present

## 2023-05-16 DIAGNOSIS — J3489 Other specified disorders of nose and nasal sinuses: Secondary | ICD-10-CM | POA: Diagnosis not present

## 2023-05-16 DIAGNOSIS — J34829 Nasal valve collapse, unspecified: Secondary | ICD-10-CM | POA: Diagnosis not present

## 2023-05-16 DIAGNOSIS — R0982 Postnasal drip: Secondary | ICD-10-CM | POA: Diagnosis not present

## 2023-05-18 ENCOUNTER — Ambulatory Visit: Payer: Medicare PPO | Admitting: Psychiatry

## 2023-05-18 DIAGNOSIS — F4323 Adjustment disorder with mixed anxiety and depressed mood: Secondary | ICD-10-CM

## 2023-05-18 NOTE — Progress Notes (Signed)
[facility] Counselor/Therapist Progress Note  Patient ID: Angel Maddox, MRN: [id number],    Date: 05/18/2023  Time Spent: 53 minutes  Treatment Type: Individual Therapy  Reported Symptoms: anger, anxiety, depression, marital issues recently, tearfulness  Mental Status Exam:  Appearance:   Neat     Behavior:  Appropriate, Sharing, and Motivated  Motor:  Some issues walking due to her MS and stroke within past 3-4 yrs  Speech/Language:   Clear and Coherent  Affect:  Depressed, Tearful, and anxious  Mood:  anxious, depressed, and sad  Thought process:  goal directed  Thought content:    Rumination  Sensory/Perceptual disturbances:    WNL  Orientation:  oriented to person, place, time/date, situation, day of week, month of year, year, and stated date of Nov. 21, 2024  Attention:  Fair  Concentration:  Good and Fair  Memory:  WNL  Fund of knowledge:   Good  Insight:    Good and Fair  Judgment:   Good and Fair  Impulse Control:  Good and Fair   Risk Assessment: Danger to Self:  No Self-injurious Behavior: No Danger to Others: No Duty to Warn:no Physical Aggression / Violence:No  Access to Firearms a concern: No  Gang Involvement:No   Subjective: Patient in session today reporting anxiety, depression, irritability, anger, marital communication issues, and tearfulness, all related to several family stressors. Was very tearful and loudly expressing her anger at onset of session today, anger at husband about lack of intimacy and tried to talk with husband but he refused. Very tearful at times and did talk through her emotions, her hurt including the behaviors of husband that "made me feel more down." Talked about the multiple layers of family stressors including within her marriage and with other grandparents, and looked at some boundaries she might want in place that would be better for her and the grandchildren involved.  (Not all details included in this note due to  patient privacy needs).  Became much less tearful and emotional towards end of session and was able to have participation and are talking about better ways of taking care of herself in the midst of these high stress times.  She denies any SI.  Very hard for her to accept  that a lot of things within the family are totally out of her control, and that acceptance is very important for patient to feel more grounded and be able to move forward herself and potentially be able to have more contact with grandchildren.  Anger and resentment lessened some however it tends to re- emerge often.  She did add that she and husband are going down to a family beach gathering during the Thanksgiving holidays and feels that that would be good for her emotionally.  To continue working on "letting go of things out of my control".  Interventions: Cognitive Behavioral Therapy and Ego-Supportive  Long term goal Develop the ability to recognize, accept, and cope with feelings of depression and anxiety. Short term  Learn and implement calming skills to reduce overall tension and moments of increased anxiety and/or depression. Patient will recognize her negative self-talk, interrupt it, and not let it prevent her from trying new strategies to help herself. Strategy: Teach patient cognitive and somatic calming skills .  Rehearse with patient how to apply these skills to her daily life.  Review and reinforce success while providing corrective feedback toward consistent implementation.   Diagnosis:   ICD-10-CM   1. Situational mixed anxiety  and depressive disorder  F43.23      Plan: Patient in session today motivated and focusing on her personal and family conflicts, depression, anxiety, anger, and frustrations.  Encouraged patient to be practicing healthier and self affirming behaviors including: Understand that some of her anxious thoughts are "just thoughts" and are not necessarily going to play out in reality the way she  imagines.  Stay in the present focused on what she can change, believe more in herself, remain in contact with supportive people, practice "the pause" as needed, refrain from assuming negative and worst-case scenarios, positive self talk, reduce overthinking and over analyzing, challenge and counteract self-doubt, continue her work on letting go of things including guilt from the past, and recognize the strength she shows working with goal-directed behaviors to move in a direction that supports her increased confidence, improved emotional health, and her outlook into the future.  Patient today motivated and working further on her depression, personal and family conflicts, anxiety, and frustrations.  She has made progress and agrees that she needs to continue her work with goal-directed behaviors in order to move in a healthier and more hopeful direction.   Goal review and progress/challenges noted with patient.  Next appt within 3 weeks.   Mathis Fare, LCSW

## 2023-06-05 ENCOUNTER — Ambulatory Visit: Payer: Medicare PPO | Admitting: Psychiatry

## 2023-06-05 DIAGNOSIS — F411 Generalized anxiety disorder: Secondary | ICD-10-CM | POA: Diagnosis not present

## 2023-06-05 NOTE — Progress Notes (Signed)
[facility] Counselor/Therapist Progress Note  Patient ID: Angel Maddox, MRN: [id number],    Date: 06/05/2023  Time Spent: 50 minutes   Treatment Type: Individual Therapy  Reported Symptoms: anxiety, depression, anger, tearfulness, marital stressors, sadness, coping with her MS    Mental Status Exam:  Appearance:   Casual     Behavior:  Appropriate, Sharing, and Motivated  Motor:  Some leg issues "from having small stroke earlier close to when I had Covid"  Speech/Language:   Clear and Coherent  Affect:  Depressed and anxiety  Mood:  angry, anxious, depressed, and sad  Thought process:  goal directed  Thought content:    Rumination  Sensory/Perceptual disturbances:    WNL  Orientation:  oriented to person, place, time/date, situation, day of week, month of year, year, and stated date of Dec. 9, 2024  Attention:  Fair  Concentration:  Good and Fair  Memory:  WNL  Fund of knowledge:   Good  Insight:    Good and Fair  Judgment:   Good  Impulse Control:  Good and Fair   Risk Assessment: Danger to Self:  No Self-injurious Behavior: No Danger to Others: No Duty to Warn:no Physical Aggression / Violence:No  Access to Firearms a concern: No  Gang Involvement:No   Subjective:  Patient actively participating in session today and reports symptoms of anxiety, grief, anger, depression, some irritability, marital communication issues, personal and family conflicts, and "other family stressors". Reviewed all of the symptoms she reported today and she also shared that her grief over death of adult son had been "coming up a lot recently and needing to talk about it more today." Shared some recent inter-family conflict and processed it initially, and then worked on her grief over death of adult son several years ago but "it comes up close to holidays". Really needed session today to further process grief re: deceased son and also her related sadness re: holiday time approaching.  Some tearfulnss "but better". Also venting anger at times during session. Worked on not responding so much to others (especially "other" grandmother in the family) when angry and upset, and instead working on having better limits and boundaries. Did state she looks forward to having the whole family on Christmas Day. Still aware of the layers of family stress but trying to not let other things "stress me".  Reviewed some of her more recent progress in certain areas and hoping that we will give her more incentive to work harder on her goals and believing herself more.  It remains very difficult for her to accept the anger and difficulties within the family is not something she can control and that she can can only control her own responses.  Commits to continue working on accepting the "things that are out of my control".  Interventions: Cognitive Behavioral Therapy and Ego-Supportive  Long term goal Develop the ability to recognize, accept, and cope with feelings of depression and anxiety. Short term  Learn and implement calming skills to reduce overall tension and moments of increased anxiety and/or depression. Patient will recognize her negative self-talk, interrupt it, and not let it prevent her from trying new strategies to help herself. Strategy: Teach patient cognitive and somatic calming skills .  Rehearse with patient how to apply these skills to her daily life.  Review and reinforce success while providing corrective feedback toward consistent implementation.  Diagnosis:   ICD-10-CM   1. Generalized anxiety disorder  F41.1  Plan:  Patient today in session showing good participation and motivation as she worked further on family and personal conflicts, depression, anger, anxiety, and how to deal better with frustrations.  Encouraged patient to be practicing healthier and self affirming behaviors including: Understand that some of her anxious thoughts are "just thoughts" and are not  necessarily going to play out in reality the way she imagines, remain in the present focused on what she can change, believe more in herself, remain in contact with supportive people, practice "the pause" as needed and helpful, refrain from assuming worst-case scenarios, positive self talk, reduce overthinking and over analyzing, challenge and counteract her self-doubt, continue working on letting go of things including guilt from the past, and recognize the strength she shows working with goal-directed behaviors to move in a direction that supports her increased confidence, improved emotional health, and her overall wellbeing.  She has shown progress and is in agreement that she needs to continue her work with goal-directed behaviors to move in a healthier and more hopeful direction.  Goal review and progress/challenges noted with patient.  Next appointment within 3 weeks.  Mathis Fare, LCSW

## 2023-06-07 ENCOUNTER — Other Ambulatory Visit: Payer: Self-pay

## 2023-06-07 ENCOUNTER — Telehealth: Payer: Self-pay | Admitting: Gastroenterology

## 2023-06-07 ENCOUNTER — Other Ambulatory Visit: Payer: Self-pay | Admitting: Gastroenterology

## 2023-06-07 DIAGNOSIS — Z7189 Other specified counseling: Secondary | ICD-10-CM | POA: Diagnosis not present

## 2023-06-07 DIAGNOSIS — Z299 Encounter for prophylactic measures, unspecified: Secondary | ICD-10-CM | POA: Diagnosis not present

## 2023-06-07 DIAGNOSIS — E7800 Pure hypercholesterolemia, unspecified: Secondary | ICD-10-CM | POA: Diagnosis not present

## 2023-06-07 DIAGNOSIS — Z1339 Encounter for screening examination for other mental health and behavioral disorders: Secondary | ICD-10-CM | POA: Diagnosis not present

## 2023-06-07 DIAGNOSIS — Z0000 Encounter for general adult medical examination without abnormal findings: Secondary | ICD-10-CM | POA: Diagnosis not present

## 2023-06-07 DIAGNOSIS — I10 Essential (primary) hypertension: Secondary | ICD-10-CM | POA: Diagnosis not present

## 2023-06-07 DIAGNOSIS — M21372 Foot drop, left foot: Secondary | ICD-10-CM | POA: Diagnosis not present

## 2023-06-07 DIAGNOSIS — Z1331 Encounter for screening for depression: Secondary | ICD-10-CM | POA: Diagnosis not present

## 2023-06-07 MED ORDER — LINACLOTIDE 72 MCG PO CAPS: 145.0000 ug | ORAL_CAPSULE | Freq: Every day | ORAL | 0 refills | Status: DC

## 2023-06-07 MED ORDER — LINACLOTIDE 72 MCG PO CAPS: 72.0000 ug | ORAL_CAPSULE | Freq: Every day | ORAL | 1 refills | Status: DC

## 2023-06-07 NOTE — Telephone Encounter (Signed)
Patient calls stating that she has been taking the Linzess 145 mcg daily 30 minutes before breakfast since 05/09/23 office visit. She states that she now has watery diarrhea 2-3 times daily, typically after eating. Patient denies any melena, hematochezia, abdominal pain or other concerning factors. She states that she tried [facility] but this was not helpful. She is no longer taking metamucil as it is also ineffective for her.  Dr Adela Lank, would it be okay to try her on Linzess 72 mcg samples? Or other recommendations?

## 2023-06-07 NOTE — Telephone Encounter (Signed)
Inbound call from patient, states Linzess 145 mcg, is causing diarrhea, patient would like to discuss options to improve. Also states she needs a refill but would like to discuss other options.

## 2023-06-07 NOTE — Telephone Encounter (Signed)
Okay. Sounds like Linzess is too strong. Let's try decreasing to / day for 30 day trial and see if that helps. Thanks

## 2023-06-07 NOTE — Telephone Encounter (Signed)
Patient has been advised that we will decrease her linzess to 72 mcg daily x 30 days. She should let us know if effective. Rx sent to pharmacy.

## 2023-06-12 DIAGNOSIS — L218 Other seborrheic dermatitis: Secondary | ICD-10-CM | POA: Diagnosis not present

## 2023-06-12 DIAGNOSIS — L570 Actinic keratosis: Secondary | ICD-10-CM | POA: Diagnosis not present

## 2023-06-12 DIAGNOSIS — X32XXXA Exposure to sunlight, initial encounter: Secondary | ICD-10-CM | POA: Diagnosis not present

## 2023-06-12 DIAGNOSIS — L578 Other skin changes due to chronic exposure to nonionizing radiation: Secondary | ICD-10-CM | POA: Diagnosis not present

## 2023-06-27 ENCOUNTER — Ambulatory Visit (INDEPENDENT_AMBULATORY_CARE_PROVIDER_SITE_OTHER): Payer: Medicare PPO | Admitting: Psychiatry

## 2023-06-27 DIAGNOSIS — F4323 Adjustment disorder with mixed anxiety and depressed mood: Secondary | ICD-10-CM | POA: Diagnosis not present
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
## 2023-07-10 ENCOUNTER — Ambulatory Visit: Payer: Medicare PPO | Admitting: Psychiatry

## 2023-07-10 DIAGNOSIS — F4323 Adjustment disorder with mixed anxiety and depressed mood: Secondary | ICD-10-CM
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
## 2023-07-12 DIAGNOSIS — J3489 Other specified disorders of nose and nasal sinuses: Secondary | ICD-10-CM | POA: Diagnosis not present

## 2023-07-13 ENCOUNTER — Telehealth: Payer: Self-pay | Admitting: Gastroenterology

## 2023-07-13 NOTE — Telephone Encounter (Signed)
Patient requesting to speak with a nurse in regards to her having watery stool. Patient is also wanting to discuss medication. Please advise.

## 2023-07-13 NOTE — Telephone Encounter (Signed)
Left message on machine to call back  

## 2023-07-17 ENCOUNTER — Ambulatory Visit: Payer: Medicare PPO | Admitting: Physician Assistant

## 2023-07-17 ENCOUNTER — Encounter: Payer: Self-pay | Admitting: Physician Assistant

## 2023-07-17 DIAGNOSIS — G4700 Insomnia, unspecified: Secondary | ICD-10-CM

## 2023-07-17 DIAGNOSIS — F4323 Adjustment disorder with mixed anxiety and depressed mood: Secondary | ICD-10-CM

## 2023-07-17 DIAGNOSIS — G35 Multiple sclerosis: Secondary | ICD-10-CM | POA: Diagnosis not present

## 2023-07-17 MED ORDER — CLONAZEPAM 1 MG PO TABS: 1.0000 mg | ORAL_TABLET | Freq: Three times a day (TID) | ORAL | 3 refills | Status: DC

## 2023-07-17 NOTE — Telephone Encounter (Signed)
Lm on vm for patient to return call 

## 2023-07-17 NOTE — Telephone Encounter (Signed)
Pt returned call. Pt will refill Linzess prescription and take 1 capsule every 2-3 days as recommended for 2 weeks. Pt will let us know if this regimen does not work for her. Pt verbalized understanding and had no concerns at the end of the call.

## 2023-07-17 NOTE — Telephone Encounter (Signed)
Pt returned call. Pt reports that she has been taking Linzess 72 mcg for about a month and continues to have "liquid stools". Pt reports about 2-3 stools a day depending on if she eats, no BM if she does not eat. Pt states that sometimes she will pass a more formed stool, but typically only liquid. Pt denies any abdominal cramping, nausea, or vomiting. Pt has not taken Bentyl or Metamucil ("stops her up"). Pt requesting recommendations, please advise. Thanks

## 2023-07-17 NOTE — Progress Notes (Signed)
[facility] Med Check  Patient ID: CARRINGTON CALENDINE,  MRN: [id number]  PCP: Kirstie Peri, MD  Date of Evaluation: 07/17/2023 Time spent:20 minutes  HISTORY/CURRENT STATUS: For routine med check.    Stressed and sad with things going on with her grandchildren's other grandparents. The grandkids are ok.  They are the kids of her deceased son, Ines Bloomer who died several years ago, which breaks her heart. Has a lot of sadness when she's around them but she can't not be around them b/c they're Shawn's kids. "I know my medicines are doing what they're supposed to be doing. I don't think making any changes will make any difference."  Energy and motivation are fare, depending on the day.  No feelings of hopelessness but does cry when she's dealing with the family situation.  Sleeps well most of the time.  ADLs and personal hygiene are normal.   Denies any changes in concentration, making decisions, or remembering things.  Appetite has not changed.  Weight is stable.   Because of the family issues, she's been more anxious.  Denies suicidal or homicidal thoughts.  Patient denies increased energy with decreased need for sleep, increased talkativeness, racing thoughts, impulsivity or risky behaviors, increased spending, increased libido, grandiosity, increased irritability or anger, paranoia, or hallucinations.  Wears a brace on her LLE to help walk and balance. MS is stable. Denies dizziness, syncope, seizures, numbness, tingling, tremor, tics, slurred speech, confusion. Denies dystonia. Denies unexplained weight loss, frequent infections, or sores that heal slowly.  No polyphagia, polydipsia, or polyuria. Denies visual changes or paresthesias.   Individual Medical History/ Review of Systems: Changes? :No     Past medications for mental health diagnoses include: Depakote, Lamictal caused insomnia, constipation, and tremor, Risperdal, Celexa, Klonopin, trazodone, Wellbutrin, Vistaril, primidone,  gabapentin, Prozac, Zyprexa, Ambien, Lunesta, Effexor  Allergies: Hydrocodone  Current Medications:  Current Outpatient Medications:    amLODipine (NORVASC) 10 MG tablet, Take 1 tablet (10 mg total) by mouth daily., Disp: 90 tablet, Rfl: 1   baclofen (LIORESAL) 10 MG tablet, Take 10 mg by mouth 2 (two) times daily. Takes one in the morning and one at noon, Disp: , Rfl:    buPROPion (WELLBUTRIN SR) 200 MG 12 hr tablet, Take 1 tablet (200 mg total) by mouth 2 (two) times daily., Disp: 60 tablet, Rfl: 11   citalopram (CELEXA) 40 MG tablet, Take 1 tablet (40 mg total) by mouth daily., Disp: 30 tablet, Rfl: 11   dalfampridine 10 MG TB12, 2 (two) times daily., Disp: , Rfl:    linaclotide (LINZESS) 72 MCG capsule, Take 1 capsule (72 mcg total) by mouth daily before breakfast., Disp: 30 capsule, Rfl: 1   losartan (COZAAR) 25 MG tablet, TAKE 1 TABLET BY MOUTH EVERY DAY, Disp: 90 tablet, Rfl: 1   mirtazapine (REMERON) 15 MG tablet, Take 1 tablet (15 mg total) by mouth at bedtime as needed., Disp: 90 tablet, Rfl: 1   omeprazole (PRILOSEC) 40 MG capsule, Take 1 capsule (40 mg total) by mouth daily., Disp: 90 capsule, Rfl: 3   ondansetron (ZOFRAN-ODT) 4 MG disintegrating tablet, TAKE 1 TABLET BY MOUTH EVERY 8 HOURS AS NEEDED FOR NAUSEA AND VOMITING, Disp: 30 tablet, Rfl: 2   primidone (MYSOLINE) 250 MG tablet, Take 250 mg by mouth 3 (three) times daily., Disp: , Rfl:    clonazePAM (KLONOPIN) 1 MG tablet, Take 1 tablet (1 mg total) by mouth 3 (three) times daily., Disp: 90 tablet, Rfl: 3   dicyclomine (BENTYL) 10 MG capsule, TAKE  1 CAPSULE (10 MG TOTAL) BY MOUTH EVERY 8 (EIGHT) HOURS AS NEEDED FOR SPASMS. (Patient not taking: Reported on 07/17/2023), Disp: 30 capsule, Rfl: 1   gabapentin (NEURONTIN) 300 MG capsule, Take 2 capsules (600 mg total) by mouth 3 (three) times daily., Disp: 180 capsule, Rfl: 0   Ofatumumab (KESIMPTA) 20 MG/0.4ML SOAJ, Inject 20 mg into the skin every 30 (thirty) days. (Patient not  taking: Reported on 05/09/2023), Disp: , Rfl:    Oxycodone HCl 10 MG TABS, Take 10 mg by mouth 3 (three) times daily as needed. (Patient not taking: Reported on 07/17/2023), Disp: , Rfl:    psyllium (METAMUCIL) 58.6 % powder, Take 1 packet by mouth daily. (Patient not taking: Reported on 05/09/2023), Disp: , Rfl:    tiZANidine (ZANAFLEX) 4 MG capsule, Take 4 mg by mouth at bedtime. (Patient not taking: Reported on 05/09/2023), Disp: , Rfl:  Medication Side Effects: none  Family Medical/ Social History: Changes? No   MENTAL HEALTH EXAM:  There were no vitals taken for this visit.There is no height or weight on file to calculate BMI.  General Appearance: Casual, Neat and Well Groomed  Eye Contact:  Good  Speech:  Clear and Coherent and Normal Rate  Volume:  Normal  Mood:   sad  Affect:  Tearful  Thought Process:  Goal Directed and Descriptions of Associations: Circumstantial  Orientation:  Full (Time, Place, and Person)  Thought Content: Logical   Suicidal Thoughts:  No  Homicidal Thoughts:  No  Memory:  WNL  Judgement:  Good  Insight:  Good  Psychomotor Activity:   walks slowly, has brace on left lower leg  Concentration:  Concentration: Good  Recall:  Good  Fund of Knowledge: Good  Language: Good  Assets:  Desire for Improvement Financial Resources/Insurance Housing Resilience Transportation  ADLs nl  Cognition: WNL  Prognosis:  Good   DIAGNOSES:    ICD-10-CM   1. Situational mixed anxiety and depressive disorder  F43.23     2. Insomnia, unspecified type  G47.00     3. MS (multiple sclerosis) (HCC)  G35      Receiving Psychotherapy: Yes  Rockne Menghini, LCSW   RECOMMENDATIONS:  PDMP was reviewed.  Klonopin filled 07/05/2023..  Gabapentin filled 06/15/23. I provided 20 minutes of face to face time during this encounter, including time spent before and after the visit in records review, medical decision making, counseling pertinent to today's visit, and charting.   She's  stable as far as her medicines go. No changes will be made.    Continue Wellbutrin SR 200 mg, 1 p.o. twice daily. Continue Celexa 40 mg, 1 p.o. daily. Continue Klonopin 1 mg, 1 po tid prn. Continue Gabapentin 300 mg, 2 po tid per another provider. Continue Mirtazapine 15 mg, 1/2-1 po at bedtime prn sleep.  Continue therapy with Rockne Menghini, LCSW.  Return in 3 months.  Melony Overly, PA-C

## 2023-07-17 NOTE — Telephone Encounter (Signed)
OKay she had previously had constipation and thought the regular dosing of Linzess was too strong. If still having loose stools despite the dosing then recommend she back off. Perhaps decrease to once every 2-3 days and see how she does. If still having loose stools despite spacing out doses can stop it. She previously did not do well on Metamucil, could try Miralax if she needs to stop Linzess but don't think it worked as well for her. She can keep [place] posted on her course.

## 2023-07-18 ENCOUNTER — Ambulatory Visit: Payer: Medicare PPO | Admitting: Psychiatry

## 2023-07-31 DIAGNOSIS — R079 Chest pain, unspecified: Secondary | ICD-10-CM | POA: Diagnosis not present

## 2023-07-31 DIAGNOSIS — W000XXA Fall on same level due to ice and snow, initial encounter: Secondary | ICD-10-CM | POA: Diagnosis not present

## 2023-07-31 DIAGNOSIS — R0781 Pleurodynia: Secondary | ICD-10-CM | POA: Diagnosis not present

## 2023-07-31 DIAGNOSIS — N189 Chronic kidney disease, unspecified: Secondary | ICD-10-CM | POA: Diagnosis not present

## 2023-07-31 DIAGNOSIS — D3500 Benign neoplasm of unspecified adrenal gland: Secondary | ICD-10-CM | POA: Diagnosis not present

## 2023-07-31 DIAGNOSIS — G35 Multiple sclerosis: Secondary | ICD-10-CM | POA: Diagnosis not present

## 2023-07-31 DIAGNOSIS — S2232XA Fracture of one rib, left side, initial encounter for closed fracture: Secondary | ICD-10-CM | POA: Diagnosis not present

## 2023-07-31 DIAGNOSIS — I129 Hypertensive chronic kidney disease with stage 1 through stage 4 chronic kidney disease, or unspecified chronic kidney disease: Secondary | ICD-10-CM | POA: Diagnosis not present

## 2023-07-31 DIAGNOSIS — M810 Age-related osteoporosis without current pathological fracture: Secondary | ICD-10-CM | POA: Diagnosis not present

## 2023-07-31 DIAGNOSIS — F319 Bipolar disorder, unspecified: Secondary | ICD-10-CM | POA: Diagnosis not present

## 2023-07-31 DIAGNOSIS — I771 Stricture of artery: Secondary | ICD-10-CM | POA: Diagnosis not present

## 2023-07-31 DIAGNOSIS — K219 Gastro-esophageal reflux disease without esophagitis: Secondary | ICD-10-CM | POA: Diagnosis not present

## 2023-07-31 DIAGNOSIS — J449 Chronic obstructive pulmonary disease, unspecified: Secondary | ICD-10-CM | POA: Diagnosis not present

## 2023-07-31 DIAGNOSIS — S2231XA Fracture of one rib, right side, initial encounter for closed fracture: Secondary | ICD-10-CM | POA: Diagnosis not present

## 2023-07-31 DIAGNOSIS — I700 Atherosclerosis of aorta: Secondary | ICD-10-CM | POA: Diagnosis not present

## 2023-07-31 DIAGNOSIS — J9811 Atelectasis: Secondary | ICD-10-CM | POA: Diagnosis not present

## 2023-07-31 DIAGNOSIS — F411 Generalized anxiety disorder: Secondary | ICD-10-CM | POA: Diagnosis not present

## 2023-08-01 ENCOUNTER — Ambulatory Visit: Payer: Medicare PPO | Admitting: Psychiatry

## 2023-08-15 ENCOUNTER — Ambulatory Visit: Payer: Medicare PPO | Admitting: Psychiatry

## 2023-08-15 DIAGNOSIS — J300 Vasomotor rhinitis: Secondary | ICD-10-CM | POA: Diagnosis not present

## 2023-08-15 DIAGNOSIS — R0982 Postnasal drip: Secondary | ICD-10-CM | POA: Diagnosis not present

## 2023-08-15 DIAGNOSIS — J3489 Other specified disorders of nose and nasal sinuses: Secondary | ICD-10-CM | POA: Diagnosis not present

## 2023-08-16 ENCOUNTER — Ambulatory Visit: Payer: Medicare PPO | Admitting: Psychiatry

## 2023-08-16 DIAGNOSIS — F4323 Adjustment disorder with mixed anxiety and depressed mood: Secondary | ICD-10-CM
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
## 2023-08-18 ENCOUNTER — Ambulatory Visit: Payer: Medicare PPO | Admitting: Psychiatry

## 2023-08-30 ENCOUNTER — Ambulatory Visit: Payer: Medicare PPO | Admitting: Psychiatry

## 2023-08-30 DIAGNOSIS — F4323 Adjustment disorder with mixed anxiety and depressed mood: Secondary | ICD-10-CM | POA: Diagnosis not present
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
## 2023-09-13 ENCOUNTER — Ambulatory Visit: Payer: Medicare PPO | Admitting: Psychiatry

## 2023-09-13 NOTE — Progress Notes (Deleted)
      [facility] Counselor/Therapist Progress Note  Patient ID: JENSYN SHAVE, MRN: [id number],    Date: 09/13/2023  Time Spent: ***   Treatment Type: {[facility] AMB THERAPY TYPES:[phone]}  Reported Symptoms: ***  Mental Status Exam:  Appearance:   {PSY:22683}     Behavior:  {PSY:[id number]}  Motor:  {PSY:22302}  Speech/Language:   {PSY:22685}  Affect:  {PSY:22687}  Mood:  {PSY:31886}  Thought process:  {PSY:31888}  Thought content:    {[phone]}  Sensory/Perceptual disturbances:    {[phone]}  Orientation:  {PSY:30297}  Attention:  {PSY:22877}  Concentration:  {[phone]}  Memory:  {PSY:[phone]}  Fund of knowledge:   {[phone]}  Insight:    {[phone]}  Judgment:   {[phone]}  Impulse Control:  {[phone]}   Risk Assessment: Danger to Self:  {PSY:22692} Self-injurious Behavior: {PSY:22692} Danger to Others: {PSY:22692} Duty to Warn:{PSY:311194} Physical Aggression / Violence:{PSY:21197} Access to Firearms a concern: {PSY:21197} Gang Involvement:{PSY:21197}  Subjective: ***   Interventions: {[phone]}  Diagnosis:No diagnosis found.  Plan: ***  Mathis Fare, LCSW

## 2023-09-18 DIAGNOSIS — M21372 Foot drop, left foot: Secondary | ICD-10-CM | POA: Diagnosis not present

## 2023-09-18 DIAGNOSIS — M205X2 Other deformities of toe(s) (acquired), left foot: Secondary | ICD-10-CM | POA: Diagnosis not present

## 2023-09-18 DIAGNOSIS — M21371 Foot drop, right foot: Secondary | ICD-10-CM | POA: Diagnosis not present

## 2023-09-18 DIAGNOSIS — R269 Unspecified abnormalities of gait and mobility: Secondary | ICD-10-CM | POA: Diagnosis not present

## 2023-09-24 ENCOUNTER — Other Ambulatory Visit: Payer: Self-pay | Admitting: Physician Assistant

## 2023-09-26 ENCOUNTER — Other Ambulatory Visit: Payer: Self-pay | Admitting: Internal Medicine

## 2023-09-26 ENCOUNTER — Ambulatory Visit
Admission: RE | Admit: 2023-09-26 | Discharge: 2023-09-26 | Disposition: A | Discharge status: Home / Self Care | Source: Ambulatory Visit | Attending: Internal Medicine | Admitting: Internal Medicine

## 2023-09-26 DIAGNOSIS — Z1231 Encounter for screening mammogram for malignant neoplasm of breast: Secondary | ICD-10-CM | POA: Diagnosis not present

## 2023-09-26 DIAGNOSIS — J449 Chronic obstructive pulmonary disease, unspecified: Secondary | ICD-10-CM | POA: Diagnosis not present

## 2023-09-26 DIAGNOSIS — Z1331 Encounter for screening for depression: Secondary | ICD-10-CM | POA: Diagnosis not present

## 2023-09-26 DIAGNOSIS — Z7189 Other specified counseling: Secondary | ICD-10-CM | POA: Diagnosis not present

## 2023-09-26 DIAGNOSIS — G35 Multiple sclerosis: Secondary | ICD-10-CM | POA: Diagnosis not present

## 2023-09-26 DIAGNOSIS — I10 Essential (primary) hypertension: Secondary | ICD-10-CM | POA: Diagnosis not present

## 2023-09-26 DIAGNOSIS — Z1339 Encounter for screening examination for other mental health and behavioral disorders: Secondary | ICD-10-CM | POA: Diagnosis not present

## 2023-09-26 DIAGNOSIS — I69359 Hemiplegia and hemiparesis following cerebral infarction affecting unspecified side: Secondary | ICD-10-CM | POA: Diagnosis not present

## 2023-09-26 DIAGNOSIS — Z299 Encounter for prophylactic measures, unspecified: Secondary | ICD-10-CM | POA: Diagnosis not present

## 2023-09-26 DIAGNOSIS — Z0000 Encounter for general adult medical examination without abnormal findings: Secondary | ICD-10-CM | POA: Diagnosis not present

## 2023-09-27 ENCOUNTER — Ambulatory Visit: Admitting: Physical Therapy

## 2023-09-27 ENCOUNTER — Ambulatory Visit: Admitting: Psychiatry

## 2023-09-27 ENCOUNTER — Ambulatory Visit: Attending: Orthopedic Surgery

## 2023-09-27 DIAGNOSIS — R2689 Other abnormalities of gait and mobility: Secondary | ICD-10-CM | POA: Diagnosis not present

## 2023-09-27 DIAGNOSIS — M6281 Muscle weakness (generalized): Secondary | ICD-10-CM | POA: Insufficient documentation

## 2023-09-27 DIAGNOSIS — R42 Dizziness and giddiness: Secondary | ICD-10-CM | POA: Insufficient documentation

## 2023-09-27 DIAGNOSIS — R2681 Unsteadiness on feet: Secondary | ICD-10-CM | POA: Insufficient documentation

## 2023-09-27 NOTE — Therapy (Signed)
 OUTPATIENT PHYSICAL THERAPY NEURO EVALUATION   Patient Name: Angel Maddox MRN: [id number] DOB:09/16/1955, 68 y.o., female Today's Date: 09/27/2023   PCP: Kirstie Peri, MD REFERRING PROVIDER: Toni Arthurs, MD   END OF SESSION:  PT End of Session - 09/27/23 1059     Visit Number 1    Number of Visits 8    Date for PT Re-Evaluation 11/24/23    PT Start Time 1109    PT Stop Time 1158    PT Time Calculation (min) 49 min    Activity Tolerance Patient tolerated treatment well    Behavior During Therapy [facility] for tasks assessed/performed             Past Medical History:  Diagnosis Date   Anxiety    Arthritis    osteo   Atypical mole 08/10/2021   Right Abdomen (side) upper (severe)   Bipolar 1 disorder (HCC)    Cervicalgia    Chronic kidney disease    kidney stone   COPD (chronic obstructive pulmonary disease) (HCC)    new diagnosis- now sees pulmonologist   Depression    Deviated septum    Elevated liver enzymes    She says "normal now"   GERD (gastroesophageal reflux disease)    H/O hiatal hernia    Headache    Hip fracture (HCC) 2017   left   History of kidney stones    4-5 yrs ago   Hypertension    Multiple sclerosis (HCC)     Had for 15 years   Multiple sclerosis (HCC)    dx 1999   Tremors of nervous system    Past Surgical History:  Procedure Laterality Date   ANTERIOR CERVICAL DECOMP/DISCECTOMY FUSION N/A 07/28/2017   Procedure: CERVICAL FOUR-FIVE, CERVICAL FIVE-SIX ANTERIOR CERVICAL DECOMPRESSION/DISCECTOMY FUSION;  Surgeon: Tia Alert, MD;  Location: [facility] OR;  Service: Neurosurgery;  Laterality: N/A;   COLONOSCOPY  2008   Dr.Kaplan   EYE SURGERY     [facility]   HIP PINNING,CANNULATED Left 11/09/2015   Procedure: CANNULATED HIP PINNING;  Surgeon: Samson Frederic, MD;  Location: [facility] ORS;  Service: Orthopedics;  Laterality: Left;   NASAL SEPTOPLASTY W/ TURBINOPLASTY Bilateral 01/12/2021   Procedure: NASAL SEPTOPLASTY WITH  BILATERAL  TURBINATE REDUCTION;  Surgeon: Wieseler Pies, MD;  Location: [facility];  Service: ENT;  Laterality: Bilateral;   TUBAL LIGATION     Patient Active Problem List   Diagnosis Date Noted   White matter abnormality on MRI of brain 11/17/2022   COVID-19 virus infection 03/20/2020   'Light-for-dates' infant with signs of fetal malnutrition 05/06/2019   Seizure-like activity (HCC) 12/27/2018   Dyslipidemia 12/20/2018   Hyponatremia syndrome 07/04/2018   Dyspnea on exertion 07/03/2018   COPD mixed type (HCC) 07/03/2018   MDD (major depressive disorder) 06/28/2018   GAD (generalized anxiety disorder) 06/28/2018   Major depressive disorder, recurrent episode, moderate (HCC) 04/17/2018   Head injury 12/08/2017   Cervical vertebral fusion 07/28/2017   DDD (degenerative disc disease), cervical 07/10/2017   DDD (degenerative disc disease), thoracic 07/10/2017   DDD (degenerative disc disease), lumbar 07/10/2017   Facet arthritis of cervical region 07/10/2017   Abnormal liver function tests 03/07/2017   Hypokalemia 03/07/2017   Gastroesophageal reflux disease with esophagitis 03/07/2017   Esophageal dysphagia 03/07/2017   Chronic idiopathic constipation 02/01/2017   Fatigue 02/01/2017   HSV-1 infection 01/12/2017   Osteoporosis with pathological fracture, with routine healing, subsequent encounter 08/30/2016   MDD (major depressive  disorder), recurrent episode, severe (HCC) 03/29/2016   Estrogen deficiency 12/15/2015   MS (multiple sclerosis) (HCC) 12/15/2015   Tremor 12/15/2015   Healthcare maintenance 12/15/2015   Essential hypertension    Protein-calorie malnutrition, severe 11/09/2015   Bipolar 1 disorder, depressed (HCC) 02/11/2015    ONSET DATE: 4+ years ago  REFERRING DIAG: Foot drop, left foot   THERAPY DIAG:  Other abnormalities of gait and mobility  Muscle weakness (generalized)  Rationale for Evaluation and Treatment: Rehabilitation  SUBJECTIVE:                                                                                                                                                                                              SUBJECTIVE STATEMENT: Patient reports that she has Multiple Sclerosis and has experienced a stroke at some time in the past. She has been using a AFO on her left foot to help prevent her foot drop which seems to be helping. She does not wear them at all times, but she always wears it when she leaves home. She has fallen multiple times over the past six months with the most recent being about 3-4 weeks ago. She had been taking exercise classes to get stronger, but she has not been doing these recently. She will use her walker at times when she feels like she needs it. He had her bathroom renovated to allow her to shower while sitting down. When she does not have these modifications, she will get dizzy while trying to turn around.  Pt accompanied by: self  PERTINENT HISTORY: Hypertension, COPD, multiple sclerosis, osteoporosis, bipolar disorder, depression, anxiety, seizure-like activity, history of COVID-19, arthritis, and chronic kidney disease  PAIN:  Are you having pain? Yes: NPRS scale: none currently Pain location: left leg Pain description: spasms Aggravating factors: movement Relieving factors: rest  PRECAUTIONS: Fall  RED FLAGS: None   WEIGHT BEARING RESTRICTIONS: No  FALLS: Has patient fallen in last 6 months? Yes. Number of falls 5-6  LIVING ENVIRONMENT: Lives with: lives with their spouse Lives in: House/apartment Stairs: No Has following equipment at home: Walker - 4 wheeled, shower chair, and AFO  PLOF: Needs assistance with ADLs  PATIENT GOALS: improved safety and balance  OBJECTIVE:  Note: Objective measures were completed at Evaluation unless otherwise noted.  DIAGNOSTIC FINDINGS: 10/21/22 brain MRI IMPRESSION: 1. Unchanged cerebral white matter lesions, consistent with the patient's  history of multiple sclerosis with superimposed chronic small-vessel disease. No new lesions or abnormal enhancement to suggest active demyelination. 2. No evidence of demyelinating disease in the cervical spinal cord. 3. Unchanged moderate neural foraminal narrowing on the right  at C2-C3, bilaterally at C3-C4, and on the right at C6-C7. COGNITION: Overall cognitive status: Within functional limits for tasks assessed   SENSATION: Light touch: Impaired throughout LLE  COORDINATION: Intention tremor observed with LE objective assessments  CRANIAL NERVE SCREEN:  I: WFL  II: abnormal III: abnormal with left beating nystagmus noted when looking to the right V: WFL VII: WFL VIII: WFL  IX: abnormal since surgery X: WFL  XI: WFL  XII: WFL   DTRs:  Patella 0 = Absent and Achilles 1 = Trace   POSTURE: rounded shoulders, forward head, and flexed trunk   LOWER EXTREMITY ROM:  unable to accurately assess AROM   LOWER EXTREMITY MMT:    MMT Right Eval Left Eval  Hip flexion 3+/5 4-/5  Hip extension    Hip abduction    Hip adduction    Hip internal rotation    Hip external rotation    Knee flexion 3+/5 4-/5  Knee extension Unable to assess due to tremor Unable to assess due to tremor  Ankle dorsiflexion    Ankle plantarflexion    Ankle inversion    Ankle eversion    (Blank rows = not tested)  TRANSFERS: Assistive device utilized: None  Sit to stand: Mod A Stand to sit: CGA  CURB:  Level of Assistance: Mod A Assistive device utilized: None Curb Comments: Patient required therapist assistance for safety  STAIRS: Level of Assistance: Modified independence Stair Negotiation Technique: Sideways with Single Rail on Right Number of Stairs: 3  Height of Stairs: 6"  Comments: Descending steps  GAIT: Gait pattern: step to pattern, decreased arm swing- Right, decreased arm swing- Left, decreased stride length, decreased hip/knee flexion- Right, decreased hip/knee flexion-  Left, Right foot flat, Left foot flat, ataxic, wide BOS, poor foot clearance- Right, and poor foot clearance- Left Assistive device utilized: None Level of assistance: CGA and Max A Comments: Patient required contact-guard assistance with intermittent periods of max assistance to prevent a loss of balance                                                                                                                 TREATMENT DATE:     PATIENT EDUCATION: Education details: Plan of care, prognosis, contacting her physician regarding her dizziness, benefits of a potential vestibular evaluation, objective findings, and goals for physical therapy Person educated: Patient Education method: Explanation Education comprehension: verbalized understanding  HOME EXERCISE PROGRAM:   GOALS: Goals reviewed with patient? Yes  LONG TERM GOALS: Target date: 10/25/23  Patient will be independent with her HEP. Baseline:  Goal status: INITIAL  2.  Patient will be able to independently transfer from sitting to standing for improved household mobility. Baseline:  Goal status: INITIAL  3.  Patient will improve her right lower extremity strength to at least 4-/5 for improved functional mobility. Baseline:  Goal status: INITIAL  ASSESSMENT:  CLINICAL IMPRESSION: Patient is a 68 y.o. female who was seen today for physical therapy evaluation and treatment for foot drop  and a history of falling. She is at a high fall risk as evidenced by her history of falling and her gait mechanics. She also exhibited reduced lower extremity strength bilaterally. Recommend that she continue with skilled physical therapy to address her impairments to maximize her functional mobility.  OBJECTIVE IMPAIRMENTS: Abnormal gait, decreased activity tolerance, decreased balance, decreased coordination, decreased mobility, difficulty walking, decreased ROM, decreased strength, dizziness, impaired sensation, and postural dysfunction.    ACTIVITY LIMITATIONS: carrying, bending, standing, squatting, stairs, transfers, bed mobility, bathing, and locomotion level  PARTICIPATION LIMITATIONS: meal prep, cleaning, laundry, shopping, and community activity  PERSONAL FACTORS: Past/current experiences, Time since onset of injury/illness/exacerbation, and 3+ comorbidities: Hypertension, COPD, multiple sclerosis, osteoporosis, bipolar disorder, depression, anxiety, seizure-like activity, history of COVID-19, arthritis, and chronic kidney disease  are also affecting patient's functional outcome.   REHAB POTENTIAL: Fair    CLINICAL DECISION MAKING: Unstable/unpredictable  EVALUATION COMPLEXITY: High  PLAN:  PT FREQUENCY: 2x/week  PT DURATION: 4 weeks  PLANNED INTERVENTIONS: 97164- PT Re-evaluation, 97110-Therapeutic exercises, 97530- Therapeutic activity, 97112- Neuromuscular re-education, 97535- Self Care, 16109- Manual therapy, [phone]- Gait training, Patient/Family education, Balance training, and Stair training  PLAN FOR NEXT SESSION: Nustep, activity modification, lower extremity strengthening, and gait training   Granville Lewis, PT 09/27/2023, 4:12 PM

## 2023-09-28 ENCOUNTER — Ambulatory Visit: Admitting: Psychiatry

## 2023-09-28 DIAGNOSIS — F4323 Adjustment disorder with mixed anxiety and depressed mood: Secondary | ICD-10-CM | POA: Diagnosis not present

## 2023-09-28 NOTE — Progress Notes (Signed)
 [facility] Counselor/Therapist Progress Note  Patient ID: Angel Maddox, MRN: [id number],    Date: 09/28/2023  Time Spent: 50 minutes   Treatment Type: Individual Therapy  Reported Symptoms: anxiety, depression, frustration, sadness, helplessness "but not hopeless"   Mental Status Exam:  Appearance:   Neat and Well Groomed     Behavior:  Appropriate, Sharing, and Motivated  Motor:  Normal  Speech/Language:   Normal Rate  Affect:  Depressed and anxious  Mood:  anxious and depressed  Thought process:  goal directed  Thought content:    WNL  Sensory/Perceptual disturbances:    WNL  Orientation:  oriented to person, place, time/date, situation, day of week, month of year, year, and stated date of September 28, 2023  Attention:  Good  Concentration:  Good  Memory:  WNL  Fund of knowledge:   Good  Insight:    Good  Judgment:   Good  Impulse Control:  Good   Risk Assessment: Danger to Self:  No Self-injurious Behavior: No Danger to Others: No Duty to Warn:no Physical Aggression / Violence:No  Access to Firearms a concern: No  Gang Involvement:No   Subjective:  Patient emotionally upset over issue with husband as he is "being very hard to deal with and takes everything that I say in a bad way."  Husband is reportedly in midst of being tested for cancer "having to do with his female parts" and adds "he doesn't really share a lot with me."  Patient venting freely and does calm herself some eventually after talking further. Having to wear brace on her foot/leg that is helping with her drop-foot, claw toes, and left leg weakness. States she is emotionally upset and frustrated with her husband. Adds "I just get so frustrated and and angry at times and sometimes little things people say and do really upset me and I just go off, but then like today, I'm able to calm down and never have thoughts of hurting myself." Supported patient in her processing her emotions and being able to clarify  some of her thoughts and feelings. Currently hurt by some of husband's words and doesn't feel the need to change any of her communication patterns. Discouraged at times and trying to follow through on better communication and interaction/understanding with husband who also has some physical/emotional issues.   Interventions: Cognitive Behavioral Therapy and Ego-Supportive  Long term goal: Develop the ability to recognize, accept, and cope with feelings of depression and anxiety. Short term:  Learn and implement calming skills to reduce overall tension and moments of increased anxiety and/or depression. Patient will recognize her negative self-talk, interrupt it, and not let it prevent her from trying new strategies to help herself. Strategy: Teach patient cognitive and somatic calming skills .  Rehearse with patient how to apply these skills to her daily life.  Review and reinforce success while providing corrective feedback toward consistent implementation.    Diagnosis:   ICD-10-CM   1. Situational mixed anxiety and depressive disorder  F43.23       Plan:   Patient in today and actively participating focusing more on her depression, anxiety, anger, and effective ways of managing frustration, relationship concerns, irritability, and "personal/family stressors/conflicts."  She is showing motivation and effort as she works on the above-stated issues and trying to improve her own self-care.  She continues to state that she does want to be more effective at managing stressful situations productively in the moment as they are happening, and  feel better about herself.  Reminded and encouraged patient in her practice of more healthy and self affirming behaviors noted in sessions including: believe more in herself, practice listening more intentionally as she talks with other people rather than thinking ahead, stay in the present and focused on what she can change versus cannot change, understand that  some of her anxious thoughts are "just thoughts" and are not necessarily going to play out reality the way she imagines, stay in contact with supportive people, practice "the pause" as needed and helpful, refrain from assuming worst-case scenarios, use of positive self talk, reduce overthinking and over analyzing, challenge and counteract self-doubt, continue working on letting go of things including guilt from the past, and realize the strength she can show working with goal-directed behaviors to move in a direction that supports her increased self-confidence/improved emotional health/and her overall wellbeing.  Maurie Musco has definitely shown some progress and she needs to continue her work with goal-directed behaviors more consistently in order to move in a more hopeful and healthier direction into the future.  Goal review and progress/challenges noted with patient.  Next appointment within 3 weeks.   Mathis Fare, LCSW

## 2023-10-03 ENCOUNTER — Encounter: Payer: Self-pay | Admitting: *Deleted

## 2023-10-03 ENCOUNTER — Ambulatory Visit: Admitting: *Deleted

## 2023-10-03 DIAGNOSIS — R42 Dizziness and giddiness: Secondary | ICD-10-CM | POA: Diagnosis not present

## 2023-10-03 DIAGNOSIS — R2689 Other abnormalities of gait and mobility: Secondary | ICD-10-CM

## 2023-10-03 DIAGNOSIS — M6281 Muscle weakness (generalized): Secondary | ICD-10-CM | POA: Diagnosis not present

## 2023-10-03 DIAGNOSIS — R2681 Unsteadiness on feet: Secondary | ICD-10-CM

## 2023-10-03 NOTE — Therapy (Signed)
 OUTPATIENT PHYSICAL THERAPY NEURO TREATMENT   Patient Name: Angel Maddox MRN: [id number] DOB:Nov 15, 1955, 68 y.o., female Today's Date: 10/03/2023   PCP: Kirstie Peri, MD REFERRING PROVIDER: Toni Arthurs, MD   END OF SESSION:  PT End of Session - 10/03/23 1056     Visit Number 2    Number of Visits 8    Date for PT Re-Evaluation 11/24/23    PT Start Time 1100    PT Stop Time 1144    PT Time Calculation (min) 44 min             Past Medical History:  Diagnosis Date   Anxiety    Arthritis    osteo   Atypical mole 08/10/2021   Right Abdomen (side) upper (severe)   Bipolar 1 disorder (HCC)    Cervicalgia    Chronic kidney disease    kidney stone   COPD (chronic obstructive pulmonary disease) (HCC)    new diagnosis- now sees pulmonologist   Depression    Deviated septum    Elevated liver enzymes    She says "normal now"   GERD (gastroesophageal reflux disease)    H/O hiatal hernia    Headache    Hip fracture (HCC) 2017   left   History of kidney stones    4-5 yrs ago   Hypertension    Multiple sclerosis (HCC)     Had for 15 years   Multiple sclerosis (HCC)    dx 1999   Tremors of nervous system    Past Surgical History:  Procedure Laterality Date   ANTERIOR CERVICAL DECOMP/DISCECTOMY FUSION N/A 07/28/2017   Procedure: CERVICAL FOUR-FIVE, CERVICAL FIVE-SIX ANTERIOR CERVICAL DECOMPRESSION/DISCECTOMY FUSION;  Surgeon: Tia Alert, MD;  Location: [facility] OR;  Service: Neurosurgery;  Laterality: N/A;   COLONOSCOPY  2008   Dr.Kaplan   EYE SURGERY     [facility] [facility]   HIP PINNING,CANNULATED Left 11/09/2015   Procedure: CANNULATED HIP PINNING;  Surgeon: Samson Frederic, MD;  Location: [facility] ORS;  Service: Orthopedics;  Laterality: Left;   NASAL SEPTOPLASTY W/ TURBINOPLASTY Bilateral 01/12/2021   Procedure: NASAL SEPTOPLASTY WITH BILATERAL  TURBINATE REDUCTION;  Surgeon: Erlandson Pies, MD;  Location:  SURGERY CENTER;  Service: ENT;  Laterality:  Bilateral;   TUBAL LIGATION     Patient Active Problem List   Diagnosis Date Noted   White matter abnormality on MRI of brain 11/17/2022   COVID-19 virus infection 03/20/2020   'Light-for-dates' infant with signs of fetal malnutrition 05/06/2019   Seizure-like activity (HCC) 12/27/2018   Dyslipidemia 12/20/2018   Hyponatremia syndrome 07/04/2018   Dyspnea on exertion 07/03/2018   COPD mixed type (HCC) 07/03/2018   MDD (major depressive disorder) 06/28/2018   GAD (generalized anxiety disorder) 06/28/2018   Major depressive disorder, recurrent episode, moderate (HCC) 04/17/2018   Head injury 12/08/2017   Cervical vertebral fusion 07/28/2017   DDD (degenerative disc disease), cervical 07/10/2017   DDD (degenerative disc disease), thoracic 07/10/2017   DDD (degenerative disc disease), lumbar 07/10/2017   Facet arthritis of cervical region 07/10/2017   Abnormal liver function tests 03/07/2017   Hypokalemia 03/07/2017   Gastroesophageal reflux disease with esophagitis 03/07/2017   Esophageal dysphagia 03/07/2017   Chronic idiopathic constipation 02/01/2017   Fatigue 02/01/2017   HSV-1 infection 01/12/2017   Osteoporosis with pathological fracture, with routine healing, subsequent encounter 08/30/2016   MDD (major depressive disorder), recurrent episode, severe (HCC) 03/29/2016   Estrogen deficiency 12/15/2015   MS (multiple sclerosis) (HCC) 12/15/2015  Tremor 12/15/2015   Healthcare maintenance 12/15/2015   Essential hypertension    Protein-calorie malnutrition, severe 11/09/2015   Bipolar 1 disorder, depressed (HCC) 02/11/2015    ONSET DATE: 4+ years ago  REFERRING DIAG: Foot drop, left foot   THERAPY DIAG:  Other abnormalities of gait and mobility  Muscle weakness (generalized)  Unsteadiness on feet  Balance problem  Dizziness and giddiness  Rationale for Evaluation and Treatment: Rehabilitation  SUBJECTIVE:                                                                                                                                                                                              SUBJECTIVE STATEMENT:Doing okay so far this morning. Energy is okay.    Pt accompanied by: self  PERTINENT HISTORY: Hypertension, COPD, multiple sclerosis, osteoporosis, bipolar disorder, depression, anxiety, seizure-like activity, history of COVID-19, arthritis, and chronic kidney disease  PAIN:  Are you having pain? Yes: NPRS scale: none currently Pain location: left leg Pain description: spasms Aggravating factors: movement Relieving factors: rest  PRECAUTIONS: Fall  RED FLAGS: None   WEIGHT BEARING RESTRICTIONS: No  FALLS: Has patient fallen in last 6 months? Yes. Number of falls 5-6  LIVING ENVIRONMENT: Lives with: lives with their spouse Lives in: House/apartment Stairs: No Has following equipment at home: Walker - 4 wheeled, shower chair, and AFO  PLOF: Needs assistance with ADLs  PATIENT GOALS: improved safety and balance  OBJECTIVE:  Note: Objective measures were completed at Evaluation unless otherwise noted.  DIAGNOSTIC FINDINGS: 10/21/22 brain MRI IMPRESSION: 1. Unchanged cerebral white matter lesions, consistent with the patient's history of multiple sclerosis with superimposed chronic small-vessel disease. No new lesions or abnormal enhancement to suggest active demyelination. 2. No evidence of demyelinating disease in the cervical spinal cord. 3. Unchanged moderate neural foraminal narrowing on the right at C2-C3, bilaterally at C3-C4, and on the right at C6-C7. COGNITION: Overall cognitive status: Within functional limits for tasks assessed   SENSATION: Light touch: Impaired throughout LLE  COORDINATION: Intention tremor observed with LE objective assessments  CRANIAL NERVE SCREEN:  I: WFL  II: abnormal III: abnormal with left beating nystagmus noted when looking to the right V: WFL VII: WFL VIII: WFL  IX:  abnormal since surgery X: WFL  XI: WFL  XII: WFL   DTRs:  Patella 0 = Absent and Achilles 1 = Trace   POSTURE: rounded shoulders, forward head, and flexed trunk   LOWER EXTREMITY ROM:  unable to accurately assess AROM   LOWER EXTREMITY MMT:    MMT Right Eval Left Eval  Hip flexion 3+/5 4-/5  Hip extension  Hip abduction    Hip adduction    Hip internal rotation    Hip external rotation    Knee flexion 3+/5 4-/5  Knee extension Unable to assess due to tremor Unable to assess due to tremor  Ankle dorsiflexion    Ankle plantarflexion    Ankle inversion    Ankle eversion    (Blank rows = not tested)  TRANSFERS: Assistive device utilized: None  Sit to stand: Mod A Stand to sit: CGA  CURB:  Level of Assistance: Mod A Assistive device utilized: None Curb Comments: Patient required therapist assistance for safety  STAIRS: Level of Assistance: Modified independence Stair Negotiation Technique: Sideways with Single Rail on Right Number of Stairs: 3  Height of Stairs: 6"  Comments: Descending steps  GAIT: Gait pattern: step to pattern, decreased arm swing- Right, decreased arm swing- Left, decreased stride length, decreased hip/knee flexion- Right, decreased hip/knee flexion- Left, Right foot flat, Left foot flat, ataxic, wide BOS, poor foot clearance- Right, and poor foot clearance- Left Assistive device utilized: None Level of assistance: CGA and Max A Comments: Patient required contact-guard assistance with intermittent periods of max assistance to prevent a loss of balance                                                                                                                 TREATMENT DATE:  10/03/23                                    EXERCISE LOG   LE Balance /strengthening/ AFO LT ankle  Exercise Repetitions and Resistance Comments  Nustep X 15 mins L2 Tolerated well today  Seated DF and PF  2  X 12 each side    Seated DF    Seated HS curl Red tband  2x10 each side Tightness felt in quads both sides and HS fatigue  Seated marching X 12 each side, x 10 each side   LAQs RT side  2x12 LT side quad spasms today       Blank cell = exercise not performed   CGA/ Min assist with gait and ambulating out to car due to balance deficits and RT LE spasms   PATIENT EDUCATION: Education details: Plan of care, prognosis, contacting her physician regarding her dizziness, benefits of a potential vestibular evaluation, objective findings, and goals for physical therapy Person educated: Patient Education method: Explanation Education comprehension: verbalized understanding  HOME EXERCISE PROGRAM:   GOALS: Goals reviewed with patient? Yes  LONG TERM GOALS: Target date: 10/25/23  Patient will be independent with her HEP. Baseline:  Goal status: INITIAL  2.  Patient will be able to independently transfer from sitting to standing for improved household mobility. Baseline:  Goal status: INITIAL  3.  Patient will improve her right lower extremity strength to at least 4-/5 for improved functional mobility. Baseline:  Goal status: INITIAL  ASSESSMENT:  CLINICAL IMPRESSION: Patient arrived today doing  fairly well upon arrival  She tolerated 15 mins on nustep today and did well and was able to complete AROM seated LE exercises with 10 to 12 reps each LE. LT LE cramping with LAQs so was discontinued. Pt mainly tired end of session while seated, but needed CGA/ min x2 assist with ambulation due to balance deficits and RT LE spasms.    OBJECTIVE IMPAIRMENTS: Abnormal gait, decreased activity tolerance, decreased balance, decreased coordination, decreased mobility, difficulty walking, decreased ROM, decreased strength, dizziness, impaired sensation, and postural dysfunction.   ACTIVITY LIMITATIONS: carrying, bending, standing, squatting, stairs, transfers, bed mobility, bathing, and locomotion level  PARTICIPATION LIMITATIONS: meal prep, cleaning,  laundry, shopping, and community activity  PERSONAL FACTORS: Past/current experiences, Time since onset of injury/illness/exacerbation, and 3+ comorbidities: Hypertension, COPD, multiple sclerosis, osteoporosis, bipolar disorder, depression, anxiety, seizure-like activity, history of COVID-19, arthritis, and chronic kidney disease  are also affecting patient's functional outcome.   REHAB POTENTIAL: Fair    CLINICAL DECISION MAKING: Unstable/unpredictable  EVALUATION COMPLEXITY: High  PLAN:  PT FREQUENCY: 2x/week  PT DURATION: 4 weeks  PLANNED INTERVENTIONS: 97164- PT Re-evaluation, 97110-Therapeutic exercises, 97530- Therapeutic activity, 97112- Neuromuscular re-education, 97535- Self Care, 16109- Manual therapy, [phone]- Gait training, Patient/Family education, Balance training, and Stair training  PLAN FOR NEXT SESSION: Nustep, activity modification, lower extremity strengthening, and gait training   Nilson Tabora,CHRIS, PTA 10/03/2023, 2:59 PM

## 2023-10-04 ENCOUNTER — Telehealth: Payer: Self-pay | Admitting: Physician Assistant

## 2023-10-05 NOTE — Telephone Encounter (Addendum)
 RF was sent on 3/30 to the requested pharmacy.  LVM with this info per DPR. Also received pharmacy interface request and refused it as being too early to fill.

## 2023-10-05 NOTE — Telephone Encounter (Signed)
 Patient called in for refill on Mirtazepam 15mg . Ph: [phone] Appt 4/21 pharmacy [facility] [address] [place]

## 2023-10-05 NOTE — Telephone Encounter (Signed)
 Per pharmacy patient picked up RF on 3/31. She went thru all her medication bottles and could not find it. Called back to report that she had thrown in the trash and her husband found them.

## 2023-10-06 ENCOUNTER — Ambulatory Visit

## 2023-10-06 DIAGNOSIS — R2681 Unsteadiness on feet: Secondary | ICD-10-CM | POA: Diagnosis not present

## 2023-10-06 DIAGNOSIS — M6281 Muscle weakness (generalized): Secondary | ICD-10-CM | POA: Diagnosis not present

## 2023-10-06 DIAGNOSIS — R2689 Other abnormalities of gait and mobility: Secondary | ICD-10-CM

## 2023-10-06 DIAGNOSIS — R42 Dizziness and giddiness: Secondary | ICD-10-CM | POA: Diagnosis not present

## 2023-10-06 NOTE — Therapy (Signed)
 OUTPATIENT PHYSICAL THERAPY NEURO TREATMENT   Patient Name: Angel Maddox MRN: [id number] DOB:May 18, 1956, 68 y.o., female Today's Date: 10/06/2023   PCP: Kirstie Peri, MD REFERRING PROVIDER: Toni Arthurs, MD   END OF SESSION:  PT End of Session - 10/06/23 1102     Visit Number 3    Number of Visits 8    Date for PT Re-Evaluation 11/24/23    PT Start Time 1100    PT Stop Time 1147    PT Time Calculation (min) 47 min    Activity Tolerance Patient limited by fatigue    Behavior During Therapy [facility] for tasks assessed/performed              Past Medical History:  Diagnosis Date   Anxiety    Arthritis    osteo   Atypical mole 08/10/2021   Right Abdomen (side) upper (severe)   Bipolar 1 disorder (HCC)    Cervicalgia    Chronic kidney disease    kidney stone   COPD (chronic obstructive pulmonary disease) (HCC)    new diagnosis- now sees pulmonologist   Depression    Deviated septum    Elevated liver enzymes    She says "normal now"   GERD (gastroesophageal reflux disease)    H/O hiatal hernia    Headache    Hip fracture (HCC) 2017   left   History of kidney stones    4-5 yrs ago   Hypertension    Multiple sclerosis (HCC)     Had for 15 years   Multiple sclerosis (HCC)    dx 1999   Tremors of nervous system    Past Surgical History:  Procedure Laterality Date   ANTERIOR CERVICAL DECOMP/DISCECTOMY FUSION N/A 07/28/2017   Procedure: CERVICAL FOUR-FIVE, CERVICAL FIVE-SIX ANTERIOR CERVICAL DECOMPRESSION/DISCECTOMY FUSION;  Surgeon: Tia Alert, MD;  Location: [facility] OR;  Service: Neurosurgery;  Laterality: N/A;   COLONOSCOPY  2008   Dr.Kaplan   EYE SURGERY     [place] [facility]   HIP PINNING,CANNULATED Left 11/09/2015   Procedure: CANNULATED HIP PINNING;  Surgeon: Samson Frederic, MD;  Location: [facility];  Service: Orthopedics;  Laterality: Left;   NASAL SEPTOPLASTY W/ TURBINOPLASTY Bilateral 01/12/2021   Procedure: NASAL SEPTOPLASTY WITH BILATERAL   TURBINATE REDUCTION;  Surgeon: Dezeeuw Pies, MD;  Location: [facility];  Service: ENT;  Laterality: Bilateral;   TUBAL LIGATION     Patient Active Problem List   Diagnosis Date Noted   White matter abnormality on MRI of brain 11/17/2022   COVID-19 virus infection 03/20/2020   'Light-for-dates' infant with signs of fetal malnutrition 05/06/2019   Seizure-like activity (HCC) 12/27/2018   Dyslipidemia 12/20/2018   Hyponatremia syndrome 07/04/2018   Dyspnea on exertion 07/03/2018   COPD mixed type (HCC) 07/03/2018   MDD (major depressive disorder) 06/28/2018   GAD (generalized anxiety disorder) 06/28/2018   Major depressive disorder, recurrent episode, moderate (HCC) 04/17/2018   Head injury 12/08/2017   Cervical vertebral fusion 07/28/2017   DDD (degenerative disc disease), cervical 07/10/2017   DDD (degenerative disc disease), thoracic 07/10/2017   DDD (degenerative disc disease), lumbar 07/10/2017   Facet arthritis of cervical region 07/10/2017   Abnormal liver function tests 03/07/2017   Hypokalemia 03/07/2017   Gastroesophageal reflux disease with esophagitis 03/07/2017   Esophageal dysphagia 03/07/2017   Chronic idiopathic constipation 02/01/2017   Fatigue 02/01/2017   HSV-1 infection 01/12/2017   Osteoporosis with pathological fracture, with routine healing, subsequent encounter 08/30/2016   MDD (major  depressive disorder), recurrent episode, severe (HCC) 03/29/2016   Estrogen deficiency 12/15/2015   MS (multiple sclerosis) (HCC) 12/15/2015   Tremor 12/15/2015   Healthcare maintenance 12/15/2015   Essential hypertension    Protein-calorie malnutrition, severe 11/09/2015   Bipolar 1 disorder, depressed (HCC) 02/11/2015    ONSET DATE: 4+ years ago  REFERRING DIAG: Foot drop, left foot   THERAPY DIAG:  Other abnormalities of gait and mobility  Muscle weakness (generalized)  Rationale for Evaluation and Treatment: Rehabilitation  SUBJECTIVE:                                                                                                                                                                                              SUBJECTIVE STATEMENT: Patient reports that she is tired today. She was tired after her last appointment and she had to have help to get into and out of her car.  Pt accompanied by: self  PERTINENT HISTORY: Hypertension, COPD, multiple sclerosis, osteoporosis, bipolar disorder, depression, anxiety, seizure-like activity, history of COVID-19, arthritis, and chronic kidney disease  PAIN:  Are you having pain? Yes: NPRS scale: none currently Pain location: left leg Pain description: spasms Aggravating factors: movement Relieving factors: rest  PRECAUTIONS: Fall  RED FLAGS: None   WEIGHT BEARING RESTRICTIONS: No  FALLS: Has patient fallen in last 6 months? Yes. Number of falls 5-6  LIVING ENVIRONMENT: Lives with: lives with their spouse Lives in: House/apartment Stairs: No Has following equipment at home: Walker - 4 wheeled, shower chair, and AFO  PLOF: Needs assistance with ADLs  PATIENT GOALS: improved safety and balance  OBJECTIVE:  Note: Objective measures were completed at Evaluation unless otherwise noted.  DIAGNOSTIC FINDINGS: 10/21/22 brain MRI IMPRESSION: 1. Unchanged cerebral white matter lesions, consistent with the patient's history of multiple sclerosis with superimposed chronic small-vessel disease. No new lesions or abnormal enhancement to suggest active demyelination. 2. No evidence of demyelinating disease in the cervical spinal cord. 3. Unchanged moderate neural foraminal narrowing on the right at C2-C3, bilaterally at C3-C4, and on the right at C6-C7. COGNITION: Overall cognitive status: Within functional limits for tasks assessed   SENSATION: Light touch: Impaired throughout LLE  COORDINATION: Intention tremor observed with LE objective assessments  CRANIAL NERVE SCREEN:  I: WFL   II: abnormal III: abnormal with left beating nystagmus noted when looking to the right V: WFL VII: WFL VIII: WFL  IX: abnormal since surgery X: WFL  XI: WFL  XII: WFL   DTRs:  Patella 0 = Absent and Achilles 1 = Trace   POSTURE: rounded shoulders, forward head, and flexed trunk   LOWER EXTREMITY  ROM:  unable to accurately assess AROM   LOWER EXTREMITY MMT:    MMT Right Eval Left Eval  Hip flexion 3+/5 4-/5  Hip extension    Hip abduction    Hip adduction    Hip internal rotation    Hip external rotation    Knee flexion 3+/5 4-/5  Knee extension Unable to assess due to tremor Unable to assess due to tremor  Ankle dorsiflexion    Ankle plantarflexion    Ankle inversion    Ankle eversion    (Blank rows = not tested)  TRANSFERS: Assistive device utilized: None  Sit to stand: Mod A Stand to sit: CGA  CURB:  Level of Assistance: Mod A Assistive device utilized: None Curb Comments: Patient required therapist assistance for safety  STAIRS: Level of Assistance: Modified independence Stair Negotiation Technique: Sideways with Single Rail on Right Number of Stairs: 3  Height of Stairs: 6"  Comments: Descending steps  GAIT: Gait pattern: step to pattern, decreased arm swing- Right, decreased arm swing- Left, decreased stride length, decreased hip/knee flexion- Right, decreased hip/knee flexion- Left, Right foot flat, Left foot flat, ataxic, wide BOS, poor foot clearance- Right, and poor foot clearance- Left Assistive device utilized: None Level of assistance: CGA and Max A Comments: Patient required contact-guard assistance with intermittent periods of max assistance to prevent a loss of balance                                                                                                                 TREATMENT DATE:                                     10/06/23 EXERCISE LOG  Exercise Repetitions and Resistance Comments  Nustep  L2 x 15 minutes   LAQ  2 x 12  reps each  Alternating LE  Seated marching  2 x 12 reps each  Alternating LE   Seated hip ADD isometric  2 x 12 reps w/ 5 second hold Ball squeeze   Seated clams  2 x 12 reps     Blank cell = exercise not performed today                                     10/03/23 EXERCISE LOG   LE Balance /strengthening/ AFO LT ankle  Exercise Repetitions and Resistance Comments  Nustep X 15 mins L2 Tolerated well today  Seated DF and PF  2  X 12 each side    Seated DF    Seated HS curl Red tband 2x10 each side Tightness felt in quads both sides and HS fatigue  Seated marching X 12 each side, x 10 each side   LAQs RT side  2x12 LT side quad spasms today       Blank cell = exercise not performed   CGA/  Min assist with gait and ambulating out to car due to balance deficits and RT LE spasms   PATIENT EDUCATION: Education details: safety, walking, and prognosis Person educated: Patient Education method: Explanation Education comprehension: verbalized understanding  HOME EXERCISE PROGRAM:   GOALS: Goals reviewed with patient? Yes  LONG TERM GOALS: Target date: 10/25/23  Patient will be independent with her HEP. Baseline:  Goal status: INITIAL  2.  Patient will be able to independently transfer from sitting to standing for improved household mobility. Baseline:  Goal status: INITIAL  3.  Patient will improve her right lower extremity strength to at least 4-/5 for improved functional mobility. Baseline:  Goal status: INITIAL  ASSESSMENT:  CLINICAL IMPRESSION: Patient was introduced to seated hip adduction isometric and clams with moderate difficulty and fatigue. She required multiple rest breaks throughout treatment due to her increased fatigue. Due to this increase in fatigue, she required minA with ambulation for safety due to her unsteadiness on her feet. She reported feeling tired upon the conclusion of treatment. She continues to require skilled physical therapy to address her remaining  impairments to maximize her safety and functional mobility.   OBJECTIVE IMPAIRMENTS: Abnormal gait, decreased activity tolerance, decreased balance, decreased coordination, decreased mobility, difficulty walking, decreased ROM, decreased strength, dizziness, impaired sensation, and postural dysfunction.   ACTIVITY LIMITATIONS: carrying, bending, standing, squatting, stairs, transfers, bed mobility, bathing, and locomotion level  PARTICIPATION LIMITATIONS: meal prep, cleaning, laundry, shopping, and community activity  PERSONAL FACTORS: Past/current experiences, Time since onset of injury/illness/exacerbation, and 3+ comorbidities: Hypertension, COPD, multiple sclerosis, osteoporosis, bipolar disorder, depression, anxiety, seizure-like activity, history of COVID-19, arthritis, and chronic kidney disease  are also affecting patient's functional outcome.   REHAB POTENTIAL: Fair    CLINICAL DECISION MAKING: Unstable/unpredictable  EVALUATION COMPLEXITY: High  PLAN:  PT FREQUENCY: 2x/week  PT DURATION: 4 weeks  PLANNED INTERVENTIONS: 97164- PT Re-evaluation, 97110-Therapeutic exercises, 97530- Therapeutic activity, 97112- Neuromuscular re-education, 97535- Self Care, [id number]- Manual therapy, [phone]- Gait training, Patient/Family education, Balance training, and Stair training  PLAN FOR NEXT SESSION: Nustep, activity modification, lower extremity strengthening, and gait training   Granville Lewis, PT 10/06/2023, 12:11 PM

## 2023-10-10 ENCOUNTER — Ambulatory Visit

## 2023-10-10 DIAGNOSIS — R2689 Other abnormalities of gait and mobility: Secondary | ICD-10-CM | POA: Diagnosis not present

## 2023-10-10 DIAGNOSIS — M6281 Muscle weakness (generalized): Secondary | ICD-10-CM | POA: Diagnosis not present

## 2023-10-10 DIAGNOSIS — R42 Dizziness and giddiness: Secondary | ICD-10-CM | POA: Diagnosis not present

## 2023-10-10 DIAGNOSIS — R2681 Unsteadiness on feet: Secondary | ICD-10-CM | POA: Diagnosis not present

## 2023-10-10 NOTE — Therapy (Signed)
 OUTPATIENT PHYSICAL THERAPY NEURO TREATMENT   Patient Name: Angel Maddox MRN: [id number] DOB:12/03/1955, 68 y.o., female Today's Date: 10/10/2023   PCP: Theoplis Fix, MD REFERRING PROVIDER: Amada Backer, MD   END OF SESSION:  PT End of Session - 10/10/23 1107     Visit Number 4    Number of Visits 8    Date for PT Re-Evaluation 11/24/23    PT Start Time 1100    PT Stop Time 1142    PT Time Calculation (min) 42 min    Activity Tolerance Patient limited by fatigue    Behavior During Therapy [facility] for tasks assessed/performed              Past Medical History:  Diagnosis Date   Anxiety    Arthritis    osteo   Atypical mole 08/10/2021   Right Abdomen (side) upper (severe)   Bipolar 1 disorder (HCC)    Cervicalgia    Chronic kidney disease    kidney stone   COPD (chronic obstructive pulmonary disease) (HCC)    new diagnosis- now sees pulmonologist   Depression    Deviated septum    Elevated liver enzymes    She says "normal now"   GERD (gastroesophageal reflux disease)    H/O hiatal hernia    Headache    Hip fracture (HCC) 2017   left   History of kidney stones    4-5 yrs ago   Hypertension    Multiple sclerosis (HCC)     Had for 15 years   Multiple sclerosis (HCC)    dx 1999   Tremors of nervous system    Past Surgical History:  Procedure Laterality Date   ANTERIOR CERVICAL DECOMP/DISCECTOMY FUSION N/A 07/28/2017   Procedure: CERVICAL FOUR-FIVE, CERVICAL FIVE-SIX ANTERIOR CERVICAL DECOMPRESSION/DISCECTOMY FUSION;  Surgeon: Isadora Mar, MD;  Location: [facility] OR;  Service: Neurosurgery;  Laterality: N/A;   COLONOSCOPY  2008   Dr.Kaplan   EYE SURGERY     [facility]   HIP PINNING,CANNULATED Left 11/09/2015   Procedure: CANNULATED HIP PINNING;  Surgeon: Adonica Hoose, MD;  Location: [facility] ORS;  Service: Orthopedics;  Laterality: Left;   NASAL SEPTOPLASTY W/ TURBINOPLASTY Bilateral 01/12/2021   Procedure: NASAL SEPTOPLASTY WITH BILATERAL   TURBINATE REDUCTION;  Surgeon: Reynold Caves, MD;  Location: [facility];  Service: ENT;  Laterality: Bilateral;   TUBAL LIGATION     Patient Active Problem List   Diagnosis Date Noted   White matter abnormality on MRI of brain 11/17/2022   COVID-19 virus infection 03/20/2020   'Light-for-dates' infant with signs of fetal malnutrition 05/06/2019   Seizure-like activity (HCC) 12/27/2018   Dyslipidemia 12/20/2018   Hyponatremia syndrome 07/04/2018   Dyspnea on exertion 07/03/2018   COPD mixed type (HCC) 07/03/2018   MDD (major depressive disorder) 06/28/2018   GAD (generalized anxiety disorder) 06/28/2018   Major depressive disorder, recurrent episode, moderate (HCC) 04/17/2018   Head injury 12/08/2017   Cervical vertebral fusion 07/28/2017   DDD (degenerative disc disease), cervical 07/10/2017   DDD (degenerative disc disease), thoracic 07/10/2017   DDD (degenerative disc disease), lumbar 07/10/2017   Facet arthritis of cervical region 07/10/2017   Abnormal liver function tests 03/07/2017   Hypokalemia 03/07/2017   Gastroesophageal reflux disease with esophagitis 03/07/2017   Esophageal dysphagia 03/07/2017   Chronic idiopathic constipation 02/01/2017   Fatigue 02/01/2017   HSV-1 infection 01/12/2017   Osteoporosis with pathological fracture, with routine healing, subsequent encounter 08/30/2016   MDD (major  depressive disorder), recurrent episode, severe (HCC) 03/29/2016   Estrogen deficiency 12/15/2015   MS (multiple sclerosis) (HCC) 12/15/2015   Tremor 12/15/2015   Healthcare maintenance 12/15/2015   Essential hypertension    Protein-calorie malnutrition, severe 11/09/2015   Bipolar 1 disorder, depressed (HCC) 02/11/2015    ONSET DATE: 4+ years ago  REFERRING DIAG: Foot drop, left foot   THERAPY DIAG:  Other abnormalities of gait and mobility  Muscle weakness (generalized)  Unsteadiness on feet  Rationale for Evaluation and Treatment:  Rehabilitation  SUBJECTIVE:                                                                                                                                                                                             SUBJECTIVE STATEMENT: Pt denies any pain today, but reports she has had a very busy week.   Pt accompanied by: self  PERTINENT HISTORY: Hypertension, COPD, multiple sclerosis, osteoporosis, bipolar disorder, depression, anxiety, seizure-like activity, history of COVID-19, arthritis, and chronic kidney disease  PAIN:  Are you having pain? Yes: NPRS scale: none currently Pain location: left leg Pain description: spasms Aggravating factors: movement Relieving factors: rest  PRECAUTIONS: Fall  RED FLAGS: None   WEIGHT BEARING RESTRICTIONS: No  FALLS: Has patient fallen in last 6 months? Yes. Number of falls 5-6  LIVING ENVIRONMENT: Lives with: lives with their spouse Lives in: House/apartment Stairs: No Has following equipment at home: Walker - 4 wheeled, shower chair, and AFO  PLOF: Needs assistance with ADLs  PATIENT GOALS: improved safety and balance  OBJECTIVE:  Note: Objective measures were completed at Evaluation unless otherwise noted.  DIAGNOSTIC FINDINGS: 10/21/22 brain MRI IMPRESSION: 1. Unchanged cerebral white matter lesions, consistent with the patient's history of multiple sclerosis with superimposed chronic small-vessel disease. No new lesions or abnormal enhancement to suggest active demyelination. 2. No evidence of demyelinating disease in the cervical spinal cord. 3. Unchanged moderate neural foraminal narrowing on the right at C2-C3, bilaterally at C3-C4, and on the right at C6-C7. COGNITION: Overall cognitive status: Within functional limits for tasks assessed   SENSATION: Light touch: Impaired throughout LLE  COORDINATION: Intention tremor observed with LE objective assessments  CRANIAL NERVE SCREEN:  I: WFL  II: abnormal III:  abnormal with left beating nystagmus noted when looking to the right V: WFL VII: WFL VIII: WFL  IX: abnormal since surgery X: WFL  XI: WFL  XII: WFL   DTRs:  Patella 0 = Absent and Achilles 1 = Trace   POSTURE: rounded shoulders, forward head, and flexed trunk   LOWER EXTREMITY ROM:  unable to accurately assess AROM  LOWER EXTREMITY MMT:    MMT Right Eval Left Eval  Hip flexion 3+/5 4-/5  Hip extension    Hip abduction    Hip adduction    Hip internal rotation    Hip external rotation    Knee flexion 3+/5 4-/5  Knee extension Unable to assess due to tremor Unable to assess due to tremor  Ankle dorsiflexion    Ankle plantarflexion    Ankle inversion    Ankle eversion    (Blank rows = not tested)  TRANSFERS: Assistive device utilized: None  Sit to stand: Mod A Stand to sit: CGA  CURB:  Level of Assistance: Mod A Assistive device utilized: None Curb Comments: Patient required therapist assistance for safety  STAIRS: Level of Assistance: Modified independence Stair Negotiation Technique: Sideways with Single Rail on Right Number of Stairs: 3  Height of Stairs: 6"  Comments: Descending steps  GAIT: Gait pattern: step to pattern, decreased arm swing- Right, decreased arm swing- Left, decreased stride length, decreased hip/knee flexion- Right, decreased hip/knee flexion- Left, Right foot flat, Left foot flat, ataxic, wide BOS, poor foot clearance- Right, and poor foot clearance- Left Assistive device utilized: None Level of assistance: CGA and Max A Comments: Patient required contact-guard assistance with intermittent periods of max assistance to prevent a loss of balance                                                                                                                 TREATMENT DATE:                                     10/10/23 EXERCISE LOG  Exercise Repetitions and Resistance Comments  Nustep  Lvl 3 x 7 mins; Lvl 2 x 8 mins   LAQ  2 x 15 reps  each  Alternating LE  Seated marching  2 x 15 reps each  Alternating LE   Seated Ham Curls Yellow 2 sets of 5 RLE, 2 sets of 10 LLE   Seated hip ADD isometric  2 x 15 reps w/ 5 second hold Ball squeeze   Seated clams      Blank cell = exercise not performed today                                     10/03/23 EXERCISE LOG   LE Balance /strengthening/ AFO LT ankle  Exercise Repetitions and Resistance Comments  Nustep X 15 mins L2 Tolerated well today  Seated DF and PF  2  X 12 each side    Seated DF    Seated HS curl Red tband 2x10 each side Tightness felt in quads both sides and HS fatigue  Seated marching X 12 each side, x 10 each side   LAQs RT side  2x12 LT side quad spasms today  Blank cell = exercise not performed   CGA/ Min assist with gait and ambulating out to car due to balance deficits and RT LE spasms   PATIENT EDUCATION: Education details: safety, walking, and prognosis Person educated: Patient Education method: Explanation Education comprehension: verbalized understanding  HOME EXERCISE PROGRAM:   GOALS: Goals reviewed with patient? Yes  LONG TERM GOALS: Target date: 10/25/23  Patient will be independent with her HEP. Baseline:  Goal status: INITIAL  2.  Patient will be able to independently transfer from sitting to standing for improved household mobility. Baseline:  Goal status: INITIAL  3.  Patient will improve her right lower extremity strength to at least 4-/5 for improved functional mobility. Baseline:  Goal status: INITIAL  ASSESSMENT:  CLINICAL IMPRESSION:  Pt arrives for today's treatment session denying any pain, but reports that she has had a very busy week up to this point.  Pt able to tolerate increased reps with previously performed exercises today with seated rest breaks taken between sets.  Pt introduced to seated resisted ham curls with fewer reps performed on right LE to reduce risk of over exertion.  Pt assisted by this PTA and PT  student to her car for safety and stability with one seated rest break required due to RLE tremor.  Pt denied any pain at completion of today's treatment session.   OBJECTIVE IMPAIRMENTS: Abnormal gait, decreased activity tolerance, decreased balance, decreased coordination, decreased mobility, difficulty walking, decreased ROM, decreased strength, dizziness, impaired sensation, and postural dysfunction.   ACTIVITY LIMITATIONS: carrying, bending, standing, squatting, stairs, transfers, bed mobility, bathing, and locomotion level  PARTICIPATION LIMITATIONS: meal prep, cleaning, laundry, shopping, and community activity  PERSONAL FACTORS: Past/current experiences, Time since onset of injury/illness/exacerbation, and 3+ comorbidities: Hypertension, COPD, multiple sclerosis, osteoporosis, bipolar disorder, depression, anxiety, seizure-like activity, history of COVID-19, arthritis, and chronic kidney disease  are also affecting patient's functional outcome.   REHAB POTENTIAL: Fair    CLINICAL DECISION MAKING: Unstable/unpredictable  EVALUATION COMPLEXITY: High  PLAN:  PT FREQUENCY: 2x/week  PT DURATION: 4 weeks  PLANNED INTERVENTIONS: 97164- PT Re-evaluation, 97110-Therapeutic exercises, 97530- Therapeutic activity, 97112- Neuromuscular re-education, 97535- Self Care, 16109- Manual therapy, [phone]- Gait training, Patient/Family education, Balance training, and Stair training  PLAN FOR NEXT SESSION: Nustep, activity modification, lower extremity strengthening, and gait training   Deryl Flora, PTA 10/10/2023, 1:13 PM

## 2023-10-16 ENCOUNTER — Ambulatory Visit: Payer: Medicare PPO | Admitting: Physician Assistant

## 2023-10-16 ENCOUNTER — Encounter: Payer: Self-pay | Admitting: Physician Assistant

## 2023-10-16 DIAGNOSIS — Z638 Other specified problems related to primary support group: Secondary | ICD-10-CM

## 2023-10-16 DIAGNOSIS — G35 Multiple sclerosis: Secondary | ICD-10-CM | POA: Diagnosis not present

## 2023-10-16 DIAGNOSIS — G4700 Insomnia, unspecified: Secondary | ICD-10-CM

## 2023-10-16 DIAGNOSIS — F4323 Adjustment disorder with mixed anxiety and depressed mood: Secondary | ICD-10-CM

## 2023-10-16 MED ORDER — BUPROPION HCL ER (XL) 150 MG PO TB24: 450.0000 mg | ORAL_TABLET | Freq: Every day | ORAL | 1 refills | Status: DC

## 2023-10-16 MED ORDER — MIRTAZAPINE 15 MG PO TABS
15.0000 mg | ORAL_TABLET | Freq: Every evening | ORAL | 0 refills | Status: DC | PRN
Start: 2023-10-16 — End: 2023-10-18

## 2023-10-16 NOTE — Progress Notes (Signed)
 [facility]  Patient ID: Angel Maddox,  MRN: [id number]  PCP: Theoplis Fix, MD  Date of Evaluation: 10/16/2023 Time spent:25 minutes  HISTORY/CURRENT STATUS: For routine med check.    Very upset with her grandchildren's other grandmother (her deceased son's children) b/c she is bullying her. She's trying to keep her from seeing her grandkids, 'she's brainwashed Shawn's daughter.  It breaks my heart.'  This has been an ongoing problem. She's more upset the past couple of months though.  ADLs and personal hygiene are normal, although it can be hard d/t MS and decreased mobility sometimes.  Some days are better than others.  Denies any changes in concentration, making decisions, or remembering things.  Appetite has not changed.  Weight is stable.   Denies suicidal or homicidal thoughts.  Anxiety is worse. Klonopin  helps but she's had to take it twice a day and sometimes tid b/c of the anxiety. Not having PA, just feels overwhelmed and sad.   Patient denies increased energy with decreased need for sleep, increased talkativeness, racing thoughts, impulsivity or risky behaviors, increased spending, increased libido, grandiosity, increased irritability or anger, paranoia, or hallucinations.  Wears a brace on her LLE to help walk and balance. MS is stable. Is in PT.  Denies dizziness, syncope, seizures, numbness, tingling, tremor, tics, slurred speech, confusion. Denies dystonia. Denies unexplained weight loss, frequent infections, or sores that heal slowly.  No polyphagia, polydipsia, or polyuria. Denies visual changes or paresthesias.   Individual Medical History/ Review of Systems: Changes? :No     Past medications for mental health diagnoses include: Depakote, Lamictal  caused insomnia, constipation, and tremor, Risperdal , Celexa , Klonopin , trazodone , Wellbutrin , Vistaril , primidone , gabapentin , Prozac, Zyprexa, Ambien , Lunesta, Effexor  Allergies: Hydrocodone   Current  Medications:  Current Outpatient Medications:    amLODipine  (NORVASC ) 10 MG tablet, Take 1 tablet (10 mg total) by mouth daily., Disp: 90 tablet, Rfl: 1   baclofen (LIORESAL) 10 MG tablet, Take 10 mg by mouth 2 (two) times daily. Takes one in the morning and one at noon, Disp: , Rfl:    buPROPion  (WELLBUTRIN  XL) 150 MG 24 hr tablet, Take 3 tablets (450 mg total) by mouth daily., Disp: 90 tablet, Rfl: 1   citalopram  (CELEXA ) 40 MG tablet, Take 1 tablet (40 mg total) by mouth daily., Disp: 30 tablet, Rfl: 11   clonazePAM  (KLONOPIN ) 1 MG tablet, Take 1 tablet (1 mg total) by mouth 3 (three) times daily., Disp: 90 tablet, Rfl: 3   dalfampridine 10 MG TB12, 2 (two) times daily., Disp: , Rfl:    linaclotide  (LINZESS ) 72 MCG capsule, Take 1 capsule (72 mcg total) by mouth daily before breakfast., Disp: 30 capsule, Rfl: 1   losartan  (COZAAR ) 25 MG tablet, TAKE 1 TABLET BY MOUTH EVERY DAY, Disp: 90 tablet, Rfl: 1   omeprazole  (PRILOSEC) 40 MG capsule, Take 1 capsule (40 mg total) by mouth daily., Disp: 90 capsule, Rfl: 3   ondansetron  (ZOFRAN -ODT) 4 MG disintegrating tablet, TAKE 1 TABLET BY MOUTH EVERY 8 HOURS AS NEEDED FOR NAUSEA AND VOMITING, Disp: 30 tablet, Rfl: 2   primidone  (MYSOLINE ) 250 MG tablet, Take 250 mg by mouth 3 (three) times daily., Disp: , Rfl:    dicyclomine  (BENTYL ) 10 MG capsule, TAKE 1 CAPSULE (10 MG TOTAL) BY MOUTH EVERY 8 (EIGHT) HOURS AS NEEDED FOR SPASMS. (Patient not taking: Reported on 10/16/2023), Disp: 30 capsule, Rfl: 1   gabapentin  (NEURONTIN ) 300 MG capsule, Take 2 capsules (600 mg total) by mouth 3 (three) times  daily., Disp: 180 capsule, Rfl: 0   mirtazapine  (REMERON ) 15 MG tablet, Take 1 tablet (15 mg total) by mouth at bedtime as needed., Disp: 30 tablet, Rfl: 0   Ofatumumab (KESIMPTA) 20 MG/0.4ML SOAJ, Inject 20 mg into the skin every 30 (thirty) days. (Patient not taking: Reported on 05/09/2023), Disp: , Rfl:    Oxycodone  HCl 10 MG TABS, Take 10 mg by mouth 3 (three)  times daily as needed. (Patient not taking: Reported on 05/09/2023), Disp: , Rfl:    psyllium (METAMUCIL) 58.6 % powder, Take 1 packet by mouth daily. (Patient not taking: Reported on 10/16/2023), Disp: , Rfl:    tiZANidine  (ZANAFLEX ) 4 MG capsule, Take 4 mg by mouth at bedtime. (Patient not taking: Reported on 10/16/2023), Disp: , Rfl:  Medication Side Effects: none  Family Medical/ Social History: Changes? No   MENTAL HEALTH EXAM:  There were no vitals taken for this visit.There is no height or weight on file to calculate BMI.  General Appearance: Casual, Neat and Well Groomed  Eye Contact:  Good  Speech:  Clear and Coherent and Normal Rate  Volume:  Normal  Mood:  Depressed  Affect:  Depressed and Tearful  Thought Process:  Goal Directed and Descriptions of Associations: Circumstantial  Orientation:  Full (Time, Place, and Person)  Thought Content: Logical   Suicidal Thoughts:  No  Homicidal Thoughts:  No  Memory:  WNL  Judgement:  Good  Insight:  Good  Psychomotor Activity:   walks slowly, has brace on left lower leg  Concentration:  Concentration: Good  Recall:  Good  Fund of Knowledge: Good  Language: Good  Assets:  Desire for Improvement Financial Resources/Insurance Housing Resilience Transportation  ADLs nl  Cognition: WNL  Prognosis:  Good   DIAGNOSES:    ICD-10-CM   1. Situational mixed anxiety and depressive disorder  F43.23     2. Insomnia, unspecified type  G47.00     3. Stress due to family tension  Z63.8     4. MS (multiple sclerosis) (HCC)  G35      Receiving Psychotherapy: Yes  Reid Capuchin, LCSW   RECOMMENDATIONS:  PDMP was reviewed.  Klonopin  for 01/14/2024.  Gabapentin  filled 09/15/2023. I provided 25 minutes of face to face time during this encounter, including time spent before and after the visit in records review, medical decision making, counseling pertinent to today's visit, and charting.   Recommend changing Wellbutrin  SR to XL so we can  increase to 450 mg/day.   Change Wellbutrin  XL to 450 mg daily. Continue Celexa  40 mg, 1 p.o. daily. Continue Klonopin  1 mg, 1 po tid prn. Continue Gabapentin  300 mg, 2 po tid per another provider. Continue Mirtazapine  15 mg, 1/2-1 po at bedtime prn sleep.  Continue therapy with Reid Capuchin, LCSW.  Return in 6-8 weeks.  Marvia Slocumb, PA-C

## 2023-10-17 ENCOUNTER — Other Ambulatory Visit: Payer: Self-pay | Admitting: Physician Assistant

## 2023-10-17 ENCOUNTER — Encounter: Admitting: *Deleted

## 2023-10-20 ENCOUNTER — Encounter: Admitting: *Deleted

## 2023-10-25 ENCOUNTER — Ambulatory Visit

## 2023-10-25 DIAGNOSIS — R2681 Unsteadiness on feet: Secondary | ICD-10-CM

## 2023-10-25 DIAGNOSIS — R2689 Other abnormalities of gait and mobility: Secondary | ICD-10-CM | POA: Diagnosis not present

## 2023-10-25 DIAGNOSIS — R42 Dizziness and giddiness: Secondary | ICD-10-CM | POA: Diagnosis not present

## 2023-10-25 DIAGNOSIS — M6281 Muscle weakness (generalized): Secondary | ICD-10-CM | POA: Diagnosis not present

## 2023-10-25 NOTE — Therapy (Signed)
 OUTPATIENT PHYSICAL THERAPY NEURO TREATMENT   Patient Name: Angel Maddox MRN: [id number] DOB:10-19-1955, 68 y.o., female Today's Date: 10/25/2023   PCP: Theoplis Fix, MD REFERRING PROVIDER: Amada Backer, MD   END OF SESSION:  PT End of Session - 10/25/23 1024     Visit Number 5    Number of Visits 8    Date for PT Re-Evaluation 11/24/23    PT Start Time 1021    PT Stop Time 1100    PT Time Calculation (min) 39 min    Activity Tolerance Patient limited by fatigue    Behavior During Therapy [facility] for tasks assessed/performed              Past Medical History:  Diagnosis Date   Anxiety    Arthritis    osteo   Atypical mole 08/10/2021   Right Abdomen (side) upper (severe)   Bipolar 1 disorder (HCC)    Cervicalgia    Chronic kidney disease    kidney stone   COPD (chronic obstructive pulmonary disease) (HCC)    new diagnosis- now sees pulmonologist   Depression    Deviated septum    Elevated liver enzymes    She says "normal now"   GERD (gastroesophageal reflux disease)    H/O hiatal hernia    Headache    Hip fracture (HCC) 2017   left   History of kidney stones    4-5 yrs ago   Hypertension    Multiple sclerosis (HCC)     Had for 15 years   Multiple sclerosis (HCC)    dx 1999   Tremors of nervous system    Past Surgical History:  Procedure Laterality Date   ANTERIOR CERVICAL DECOMP/DISCECTOMY FUSION N/A 07/28/2017   Procedure: CERVICAL FOUR-FIVE, CERVICAL FIVE-SIX ANTERIOR CERVICAL DECOMPRESSION/DISCECTOMY FUSION;  Surgeon: Isadora Mar, MD;  Location: [facility] OR;  Service: Neurosurgery;  Laterality: N/A;   COLONOSCOPY  2008   Dr.Kaplan   EYE SURGERY     [facility]   HIP PINNING,CANNULATED Left 11/09/2015   Procedure: CANNULATED HIP PINNING;  Surgeon: Adonica Hoose, MD;  Location: [facility] ORS;  Service: Orthopedics;  Laterality: Left;   NASAL SEPTOPLASTY W/ TURBINOPLASTY Bilateral 01/12/2021   Procedure: NASAL SEPTOPLASTY WITH BILATERAL   TURBINATE REDUCTION;  Surgeon: Reynold Caves, MD;  Location: [facility];  Service: ENT;  Laterality: Bilateral;   TUBAL LIGATION     Patient Active Problem List   Diagnosis Date Noted   White matter abnormality on MRI of brain 11/17/2022   COVID-19 virus infection 03/20/2020   'Light-for-dates' infant with signs of fetal malnutrition 05/06/2019   Seizure-like activity (HCC) 12/27/2018   Dyslipidemia 12/20/2018   Hyponatremia syndrome 07/04/2018   Dyspnea on exertion 07/03/2018   COPD mixed type (HCC) 07/03/2018   MDD (major depressive disorder) 06/28/2018   GAD (generalized anxiety disorder) 06/28/2018   Major depressive disorder, recurrent episode, moderate (HCC) 04/17/2018   Head injury 12/08/2017   Cervical vertebral fusion 07/28/2017   DDD (degenerative disc disease), cervical 07/10/2017   DDD (degenerative disc disease), thoracic 07/10/2017   DDD (degenerative disc disease), lumbar 07/10/2017   Facet arthritis of cervical region 07/10/2017   Abnormal liver function tests 03/07/2017   Hypokalemia 03/07/2017   Gastroesophageal reflux disease with esophagitis 03/07/2017   Esophageal dysphagia 03/07/2017   Chronic idiopathic constipation 02/01/2017   Fatigue 02/01/2017   HSV-1 infection 01/12/2017   Osteoporosis with pathological fracture, with routine healing, subsequent encounter 08/30/2016   MDD (major  depressive disorder), recurrent episode, severe (HCC) 03/29/2016   Estrogen deficiency 12/15/2015   MS (multiple sclerosis) (HCC) 12/15/2015   Tremor 12/15/2015   Healthcare maintenance 12/15/2015   Essential hypertension    Protein-calorie malnutrition, severe 11/09/2015   Bipolar 1 disorder, depressed (HCC) 02/11/2015    ONSET DATE: 4+ years ago  REFERRING DIAG: Foot drop, left foot   THERAPY DIAG:  Other abnormalities of gait and mobility  Muscle weakness (generalized)  Unsteadiness on feet  Rationale for Evaluation and Treatment:  Rehabilitation  SUBJECTIVE:                                                                                                                                                                                             SUBJECTIVE STATEMENT: Pt denies any pain today, but reports she had a fall on Monday while in exercise class.  Does not report any injuries.   Pt accompanied by: self  PERTINENT HISTORY: Hypertension, COPD, multiple sclerosis, osteoporosis, bipolar disorder, depression, anxiety, seizure-like activity, history of COVID-19, arthritis, and chronic kidney disease  PAIN:  Are you having pain? Yes: NPRS scale: none currently Pain location: left leg Pain description: spasms Aggravating factors: movement Relieving factors: rest  PRECAUTIONS: Fall  RED FLAGS: None   WEIGHT BEARING RESTRICTIONS: No  FALLS: Has patient fallen in last 6 months? Yes. Number of falls 5-6  LIVING ENVIRONMENT: Lives with: lives with their spouse Lives in: House/apartment Stairs: No Has following equipment at home: Walker - 4 wheeled, shower chair, and AFO  PLOF: Needs assistance with ADLs  PATIENT GOALS: improved safety and balance  OBJECTIVE:  Note: Objective measures were completed at Evaluation unless otherwise noted.  DIAGNOSTIC FINDINGS: 10/21/22 brain MRI IMPRESSION: 1. Unchanged cerebral white matter lesions, consistent with the patient's history of multiple sclerosis with superimposed chronic small-vessel disease. No new lesions or abnormal enhancement to suggest active demyelination. 2. No evidence of demyelinating disease in the cervical spinal cord. 3. Unchanged moderate neural foraminal narrowing on the right at C2-C3, bilaterally at C3-C4, and on the right at C6-C7. COGNITION: Overall cognitive status: Within functional limits for tasks assessed   SENSATION: Light touch: Impaired throughout LLE  COORDINATION: Intention tremor observed with LE objective  assessments  CRANIAL NERVE SCREEN:  I: WFL  II: abnormal III: abnormal with left beating nystagmus noted when looking to the right V: WFL VII: WFL VIII: WFL  IX: abnormal since surgery X: WFL  XI: WFL  XII: WFL   DTRs:  Patella 0 = Absent and Achilles 1 = Trace   POSTURE: rounded shoulders, forward head, and flexed trunk   LOWER EXTREMITY  ROM:  unable to accurately assess AROM   LOWER EXTREMITY MMT:    MMT Right Eval Left Eval  Hip flexion 3+/5 4-/5  Hip extension    Hip abduction    Hip adduction    Hip internal rotation    Hip external rotation    Knee flexion 3+/5 4-/5  Knee extension Unable to assess due to tremor Unable to assess due to tremor  Ankle dorsiflexion    Ankle plantarflexion    Ankle inversion    Ankle eversion    (Blank rows = not tested)  TRANSFERS: Assistive device utilized: None  Sit to stand: Mod A Stand to sit: CGA  CURB:  Level of Assistance: Mod A Assistive device utilized: None Curb Comments: Patient required therapist assistance for safety  STAIRS: Level of Assistance: Modified independence Stair Negotiation Technique: Sideways with Single Rail on Right Number of Stairs: 3  Height of Stairs: 6"  Comments: Descending steps  GAIT: Gait pattern: step to pattern, decreased arm swing- Right, decreased arm swing- Left, decreased stride length, decreased hip/knee flexion- Right, decreased hip/knee flexion- Left, Right foot flat, Left foot flat, ataxic, wide BOS, poor foot clearance- Right, and poor foot clearance- Left Assistive device utilized: None Level of assistance: CGA and Max A Comments: Patient required contact-guard assistance with intermittent periods of max assistance to prevent a loss of balance                                                                                                                 TREATMENT DATE:                                     10/25/23 EXERCISE LOG  Exercise Repetitions and Resistance  Comments  Nustep  Lvl 2 x 15 mins TE  Heel Raises w/ Ball Squeeze 3 sets of 8 reps NR  LAQ     Seated marching with arm  3 x 12 reps each  Alternating LE NR   Seated Ham Curls    Seated hip ADD isometric  3 sets of 8 reps 2 second hold  Ball squeeze TE   Toe raises   3 sets of 12 reps  NR   Blank cell = exercise not performed today                                     10/03/23 EXERCISE LOG   LE Balance /strengthening/ AFO LT ankle  Exercise Repetitions and Resistance Comments  Nustep X 15 mins L2 Tolerated well today  Seated DF and PF  2  X 12 each side    Seated DF    Seated HS curl Red tband 2x10 each side Tightness felt in quads both sides and HS fatigue  Seated marching X 12 each side, x 10 each side   LAQs RT side  2x12  LT side quad spasms today       Blank cell = exercise not performed   CGA/ Min assist with gait and ambulating out to car due to balance deficits and RT LE spasms   PATIENT EDUCATION: Education details: safety, walking, and prognosis Person educated: Patient Education method: Explanation Education comprehension: verbalized understanding  HOME EXERCISE PROGRAM:   GOALS: Goals reviewed with patient? Yes  LONG TERM GOALS: Target date: 10/25/23  Patient will be independent with her HEP. Baseline:  Goal status: IN PROGRESS  2.  Patient will be able to independently transfer from sitting to standing for improved household mobility. Baseline:  Goal status: IN PROGRESS  3.  Patient will improve her right lower extremity strength to at least 4-/5 for improved functional mobility. Baseline:  Goal status: IN PROGRESS  ASSESSMENT:  CLINICAL IMPRESSION:  Pt arrives for today's treatment session five minutes late denying any pain.  Pt reports she fell while at exercise class on Monday, but did not hurt anything. She was provided verbal cueing for heel raises, seated march with cross reach, and toe raises exercises to ensure proper mechanics and muscle  engagement. Pt given seated rest breaks as needed due to fatigue.  She reported slight fatigue and no pain upon completion of treatment session. Patient will continue to benefit from skilled PT to address impairments and improve functional mobility.      OBJECTIVE IMPAIRMENTS: Abnormal gait, decreased activity tolerance, decreased balance, decreased coordination, decreased mobility, difficulty walking, decreased ROM, decreased strength, dizziness, impaired sensation, and postural dysfunction.   ACTIVITY LIMITATIONS: carrying, bending, standing, squatting, stairs, transfers, bed mobility, bathing, and locomotion level  PARTICIPATION LIMITATIONS: meal prep, cleaning, laundry, shopping, and community activity  PERSONAL FACTORS: Past/current experiences, Time since onset of injury/illness/exacerbation, and 3+ comorbidities: Hypertension, COPD, multiple sclerosis, osteoporosis, bipolar disorder, depression, anxiety, seizure-like activity, history of COVID-19, arthritis, and chronic kidney disease  are also affecting patient's functional outcome.   REHAB POTENTIAL: Fair    CLINICAL DECISION MAKING: Unstable/unpredictable  EVALUATION COMPLEXITY: High  PLAN:  PT FREQUENCY: 2x/week  PT DURATION: 4 weeks  PLANNED INTERVENTIONS: 97164- PT Re-evaluation, 97110-Therapeutic exercises, 97530- Therapeutic activity, 97112- Neuromuscular re-education, 97535- Self Care, 69629- Manual therapy, [phone]- Gait training, Patient/Family education, Balance training, and Stair training  PLAN FOR NEXT SESSION:  Nustep, activity modification, lower extremity strengthening, and introduce gait training   Deryl Flora, PTA 10/25/2023, 11:17 AM

## 2023-10-27 ENCOUNTER — Ambulatory Visit: Attending: Orthopedic Surgery

## 2023-10-27 DIAGNOSIS — R2681 Unsteadiness on feet: Secondary | ICD-10-CM | POA: Diagnosis not present

## 2023-10-27 DIAGNOSIS — M6281 Muscle weakness (generalized): Secondary | ICD-10-CM | POA: Diagnosis not present

## 2023-10-27 DIAGNOSIS — R2689 Other abnormalities of gait and mobility: Secondary | ICD-10-CM | POA: Diagnosis not present

## 2023-10-27 NOTE — Therapy (Signed)
 OUTPATIENT PHYSICAL THERAPY NEURO TREATMENT   Patient Name: Angel Maddox MRN: [id number] DOB:Feb 01, 1956, 68 y.o., female Today's Date: 10/27/2023   PCP: Theoplis Fix, MD REFERRING PROVIDER: Amada Backer, MD   END OF SESSION:  PT End of Session - 10/27/23 1106     Visit Number 6    Number of Visits 8    Date for PT Re-Evaluation 11/24/23    PT Start Time 1101    PT Stop Time 1148    PT Time Calculation (min) 47 min    Activity Tolerance Patient limited by fatigue    Behavior During Therapy [facility] for tasks assessed/performed               Past Medical History:  Diagnosis Date   Anxiety    Arthritis    osteo   Atypical mole 08/10/2021   Right Abdomen (side) upper (severe)   Bipolar 1 disorder (HCC)    Cervicalgia    Chronic kidney disease    kidney stone   COPD (chronic obstructive pulmonary disease) (HCC)    new diagnosis- now sees pulmonologist   Depression    Deviated septum    Elevated liver enzymes    She says "normal now"   GERD (gastroesophageal reflux disease)    H/O hiatal hernia    Headache    Hip fracture (HCC) 2017   left   History of kidney stones    4-5 yrs ago   Hypertension    Multiple sclerosis (HCC)     Had for 15 years   Multiple sclerosis (HCC)    dx 1999   Tremors of nervous system    Past Surgical History:  Procedure Laterality Date   ANTERIOR CERVICAL DECOMP/DISCECTOMY FUSION N/A 07/28/2017   Procedure: CERVICAL FOUR-FIVE, CERVICAL FIVE-SIX ANTERIOR CERVICAL DECOMPRESSION/DISCECTOMY FUSION;  Surgeon: Isadora Mar, MD;  Location: [facility] OR;  Service: Neurosurgery;  Laterality: N/A;   COLONOSCOPY  2008   Dr.Kaplan   EYE SURGERY     [facility]   HIP PINNING,CANNULATED Left 11/09/2015   Procedure: CANNULATED HIP PINNING;  Surgeon: Adonica Hoose, MD;  Location: [facility];  Service: Orthopedics;  Laterality: Left;   NASAL SEPTOPLASTY W/ TURBINOPLASTY Bilateral 01/12/2021   Procedure: NASAL SEPTOPLASTY WITH BILATERAL   TURBINATE REDUCTION;  Surgeon: Reynold Caves, MD;  Location: [facility];  Service: ENT;  Laterality: Bilateral;   TUBAL LIGATION     Patient Active Problem List   Diagnosis Date Noted   White matter abnormality on MRI of brain 11/17/2022   COVID-19 virus infection 03/20/2020   'Light-for-dates' infant with signs of fetal malnutrition 05/06/2019   Seizure-like activity (HCC) 12/27/2018   Dyslipidemia 12/20/2018   Hyponatremia syndrome 07/04/2018   Dyspnea on exertion 07/03/2018   COPD mixed type (HCC) 07/03/2018   MDD (major depressive disorder) 06/28/2018   GAD (generalized anxiety disorder) 06/28/2018   Major depressive disorder, recurrent episode, moderate (HCC) 04/17/2018   Head injury 12/08/2017   Cervical vertebral fusion 07/28/2017   DDD (degenerative disc disease), cervical 07/10/2017   DDD (degenerative disc disease), thoracic 07/10/2017   DDD (degenerative disc disease), lumbar 07/10/2017   Facet arthritis of cervical region 07/10/2017   Abnormal liver function tests 03/07/2017   Hypokalemia 03/07/2017   Gastroesophageal reflux disease with esophagitis 03/07/2017   Esophageal dysphagia 03/07/2017   Chronic idiopathic constipation 02/01/2017   Fatigue 02/01/2017   HSV-1 infection 01/12/2017   Osteoporosis with pathological fracture, with routine healing, subsequent encounter 08/30/2016   MDD (  major depressive disorder), recurrent episode, severe (HCC) 03/29/2016   Estrogen deficiency 12/15/2015   MS (multiple sclerosis) (HCC) 12/15/2015   Tremor 12/15/2015   Healthcare maintenance 12/15/2015   Essential hypertension    Protein-calorie malnutrition, severe 11/09/2015   Bipolar 1 disorder, depressed (HCC) 02/11/2015    ONSET DATE: 4+ years ago  REFERRING DIAG: Foot drop, left foot   THERAPY DIAG:  Other abnormalities of gait and mobility  Muscle weakness (generalized)  Rationale for Evaluation and Treatment: Rehabilitation  SUBJECTIVE:                                                                                                                                                                                              SUBJECTIVE STATEMENT: Patient reports that she feels good today.   Pt accompanied by: self  PERTINENT HISTORY: Hypertension, COPD, multiple sclerosis, osteoporosis, bipolar disorder, depression, anxiety, seizure-like activity, history of COVID-19, arthritis, and chronic kidney disease  PAIN:  Are you having pain? Yes: NPRS scale: none currently Pain location: left leg Pain description: spasms Aggravating factors: movement Relieving factors: rest  PRECAUTIONS: Fall  RED FLAGS: None   WEIGHT BEARING RESTRICTIONS: No  FALLS: Has patient fallen in last 6 months? Yes. Number of falls 5-6  LIVING ENVIRONMENT: Lives with: lives with their spouse Lives in: House/apartment Stairs: No Has following equipment at home: Walker - 4 wheeled, shower chair, and AFO  PLOF: Needs assistance with ADLs  PATIENT GOALS: improved safety and balance  OBJECTIVE:  Note: Objective measures were completed at Evaluation unless otherwise noted.  DIAGNOSTIC FINDINGS: 10/21/22 brain MRI IMPRESSION: 1. Unchanged cerebral white matter lesions, consistent with the patient's history of multiple sclerosis with superimposed chronic small-vessel disease. No new lesions or abnormal enhancement to suggest active demyelination. 2. No evidence of demyelinating disease in the cervical spinal cord. 3. Unchanged moderate neural foraminal narrowing on the right at C2-C3, bilaterally at C3-C4, and on the right at C6-C7. COGNITION: Overall cognitive status: Within functional limits for tasks assessed   SENSATION: Light touch: Impaired throughout LLE  COORDINATION: Intention tremor observed with LE objective assessments  CRANIAL NERVE SCREEN:  I: WFL  II: abnormal III: abnormal with left beating nystagmus noted when looking to the right V:  WFL VII: WFL VIII: WFL  IX: abnormal since surgery X: WFL  XI: WFL  XII: WFL   DTRs:  Patella 0 = Absent and Achilles 1 = Trace   POSTURE: rounded shoulders, forward head, and flexed trunk   LOWER EXTREMITY ROM:  unable to accurately assess AROM   LOWER EXTREMITY MMT:    MMT Right Eval Left  Eval  Hip flexion 3+/5 4-/5  Hip extension    Hip abduction    Hip adduction    Hip internal rotation    Hip external rotation    Knee flexion 3+/5 4-/5  Knee extension Unable to assess due to tremor Unable to assess due to tremor  Ankle dorsiflexion    Ankle plantarflexion    Ankle inversion    Ankle eversion    (Blank rows = not tested)  TRANSFERS: Assistive device utilized: None  Sit to stand: Mod A Stand to sit: CGA  CURB:  Level of Assistance: Mod A Assistive device utilized: None Curb Comments: Patient required therapist assistance for safety  STAIRS: Level of Assistance: Modified independence Stair Negotiation Technique: Sideways with Single Rail on Right Number of Stairs: 3  Height of Stairs: 6"  Comments: Descending steps  GAIT: Gait pattern: step to pattern, decreased arm swing- Right, decreased arm swing- Left, decreased stride length, decreased hip/knee flexion- Right, decreased hip/knee flexion- Left, Right foot flat, Left foot flat, ataxic, wide BOS, poor foot clearance- Right, and poor foot clearance- Left Assistive device utilized: None Level of assistance: CGA and Max A Comments: Patient required contact-guard assistance with intermittent periods of max assistance to prevent a loss of balance                                                                                                                 TREATMENT DATE:                                     10/27/23 EXERCISE LOG  Exercise Repetitions and Resistance Comments  Nustep  L3 x 5 minutes to L2 x 8 minutes to L1 x 2 minutes   Seated triceps pull down  Yellow t-band x 3 x 8 reps each  For improved  triceps engagement needed for sit to stand transfers  Seated reaching 2 x 11 reps each  Alternating UE; for improved safety reaching outside of her base of support  Seated heel raise  2 x 12 reps    Seated march with contralateral reach  3 x 8 reps each  Alternating UE and LE; for improved upper and lower extremity coordination  Bilateral ER  Yellow t-band x 3 x 8 reps For postural reeducation    Blank cell = exercise not performed today                                    10/25/23 EXERCISE LOG  Exercise Repetitions and Resistance Comments  Nustep  Lvl 2 x 15 mins TE  Heel Raises w/ Ball Squeeze 3 sets of 8 reps NR  LAQ     Seated marching with arm  3 x 12 reps each  Alternating LE NR   Seated Ham Curls    Seated hip ADD isometric  3 sets of  8 reps 2 second hold  Ball squeeze TE   Toe raises   3 sets of 12 reps  NR   Blank cell = exercise not performed today                                     10/03/23 EXERCISE LOG   LE Balance /strengthening/ AFO LT ankle  Exercise Repetitions and Resistance Comments  Nustep X 15 mins L2 Tolerated well today  Seated DF and PF  2  X 12 each side    Seated DF    Seated HS curl Red tband 2x10 each side Tightness felt in quads both sides and HS fatigue  Seated marching X 12 each side, x 10 each side   LAQs RT side  2x12 LT side quad spasms today       Blank cell = exercise not performed   CGA/ Min assist with gait and ambulating out to car due to balance deficits and RT LE spasms   PATIENT EDUCATION: Education details: safety, walking, and prognosis Person educated: Patient Education method: Explanation Education comprehension: verbalized understanding  HOME EXERCISE PROGRAM:   GOALS: Goals reviewed with patient? Yes  LONG TERM GOALS: Target date: 10/25/23  Patient will be independent with her HEP. Baseline:  Goal status: IN PROGRESS  2.  Patient will be able to independently transfer from sitting to standing for improved household  mobility. Baseline:  Goal status: IN PROGRESS  3.  Patient will improve her right lower extremity strength to at least 4-/5 for improved functional mobility. Baseline:  Goal status: IN PROGRESS  ASSESSMENT:  CLINICAL IMPRESSION:  Patient was introduced to multiple new interventions for improved function with transfers and reaching. She required minimal cueing with today's interventions for proper exercise performance. She exhibited increased fatigue with today's interventions as evidenced by her need for minimal to maximal assistance with ambulation at the conclusion of treatment. She reported feeling alright upon the conclusion of treatment. She continues to require skilled physical therapy to address her remaining impairments to maximize his safety and functional mobility.    OBJECTIVE IMPAIRMENTS: Abnormal gait, decreased activity tolerance, decreased balance, decreased coordination, decreased mobility, difficulty walking, decreased ROM, decreased strength, dizziness, impaired sensation, and postural dysfunction.   ACTIVITY LIMITATIONS: carrying, bending, standing, squatting, stairs, transfers, bed mobility, bathing, and locomotion level  PARTICIPATION LIMITATIONS: meal prep, cleaning, laundry, shopping, and community activity  PERSONAL FACTORS: Past/current experiences, Time since onset of injury/illness/exacerbation, and 3+ comorbidities: Hypertension, COPD, multiple sclerosis, osteoporosis, bipolar disorder, depression, anxiety, seizure-like activity, history of COVID-19, arthritis, and chronic kidney disease  are also affecting patient's functional outcome.   REHAB POTENTIAL: Fair    CLINICAL DECISION MAKING: Unstable/unpredictable  EVALUATION COMPLEXITY: High  PLAN:  PT FREQUENCY: 2x/week  PT DURATION: 4 weeks  PLANNED INTERVENTIONS: 97164- PT Re-evaluation, 97110-Therapeutic exercises, 97530- Therapeutic activity, 97112- Neuromuscular re-education, 97535- Self Care, 29562-  Manual therapy, [phone]- Gait training, Patient/Family education, Balance training, and Stair training  PLAN FOR NEXT SESSION:  Nustep, activity modification, lower extremity strengthening, and introduce gait training   Lane Pinon, PT 10/27/2023, 12:35 PM

## 2023-10-30 ENCOUNTER — Encounter

## 2023-11-01 DIAGNOSIS — R2689 Other abnormalities of gait and mobility: Secondary | ICD-10-CM | POA: Diagnosis not present

## 2023-11-01 DIAGNOSIS — F1190 Opioid use, unspecified, uncomplicated: Secondary | ICD-10-CM | POA: Diagnosis not present

## 2023-11-01 DIAGNOSIS — M7918 Myalgia, other site: Secondary | ICD-10-CM | POA: Diagnosis not present

## 2023-11-01 DIAGNOSIS — R296 Repeated falls: Secondary | ICD-10-CM | POA: Diagnosis not present

## 2023-11-01 DIAGNOSIS — R531 Weakness: Secondary | ICD-10-CM | POA: Diagnosis not present

## 2023-11-02 ENCOUNTER — Telehealth: Payer: Self-pay | Admitting: Physician Assistant

## 2023-11-02 NOTE — Telephone Encounter (Signed)
 It, along with several meds she's on, especially Klonopin , can cause respiratory depression. Please call Viona with that info and tell her to take the Klonopin  as sparingly as possible, and do not take within 4 hours of the Oxycodone  if not absolutely necessary. Tell pharmacy I'm aware and it's ok. Thanks.

## 2023-11-02 NOTE — Telephone Encounter (Signed)
 Called patient and provided the recommendations on taking clonazepam  and oxycodone . Called the pharmacy and told them that Angel Maddox is aware of patient taking the oxycodone  and that she was ok with it. Pharmacist said their system upgraded last Tuesday and they have to make providers aware of multiple controlled medications from different providers.

## 2023-11-02 NOTE — Telephone Encounter (Signed)
 Please see message from patient and advise.  ?

## 2023-11-02 NOTE — Telephone Encounter (Signed)
 Pt called and said that she saw her MS doctor yesterday. The dr. Prescribed oxycodone  10 mg. She can take it once a day as needed. The pharmacy will not fill it without teresa permission. Pharmacy wants to make sure it will not interact with psyche meds . Please ask teresa and [facility]

## 2023-11-07 ENCOUNTER — Other Ambulatory Visit: Payer: Self-pay | Admitting: Physician Assistant

## 2023-11-07 ENCOUNTER — Encounter

## 2023-11-10 ENCOUNTER — Encounter

## 2023-11-14 ENCOUNTER — Ambulatory Visit: Admitting: *Deleted

## 2023-11-17 ENCOUNTER — Encounter

## 2023-11-23 ENCOUNTER — Ambulatory Visit

## 2023-11-23 DIAGNOSIS — M6281 Muscle weakness (generalized): Secondary | ICD-10-CM | POA: Diagnosis not present

## 2023-11-23 DIAGNOSIS — R2681 Unsteadiness on feet: Secondary | ICD-10-CM | POA: Diagnosis not present

## 2023-11-23 DIAGNOSIS — R2689 Other abnormalities of gait and mobility: Secondary | ICD-10-CM

## 2023-11-23 NOTE — Therapy (Signed)
 OUTPATIENT PHYSICAL THERAPY NEURO TREATMENT   Patient Name: Angel Maddox MRN: [id number] DOB:26-Feb-1956, 68 y.o., female Today's Date: 11/23/2023   PCP: Theoplis Fix, MD REFERRING PROVIDER: Amada Backer, MD   END OF SESSION:  PT End of Session - 11/23/23 1018     Visit Number 7    Number of Visits 8    Date for PT Re-Evaluation 11/24/23    PT Start Time 1015    PT Stop Time 1055    PT Time Calculation (min) 40 min    Activity Tolerance Patient limited by fatigue    Behavior During Therapy [facility] for tasks assessed/performed               Past Medical History:  Diagnosis Date   Anxiety    Arthritis    osteo   Atypical mole 08/10/2021   Right Abdomen (side) upper (severe)   Bipolar 1 disorder (HCC)    Cervicalgia    Chronic kidney disease    kidney stone   COPD (chronic obstructive pulmonary disease) (HCC)    new diagnosis- now sees pulmonologist   Depression    Deviated septum    Elevated liver enzymes    She says "normal now"   GERD (gastroesophageal reflux disease)    H/O hiatal hernia    Headache    Hip fracture (HCC) 2017   left   History of kidney stones    4-5 yrs ago   Hypertension    Multiple sclerosis (HCC)     Had for 15 years   Multiple sclerosis (HCC)    dx 1999   Tremors of nervous system    Past Surgical History:  Procedure Laterality Date   ANTERIOR CERVICAL DECOMP/DISCECTOMY FUSION N/A 07/28/2017   Procedure: CERVICAL FOUR-FIVE, CERVICAL FIVE-SIX ANTERIOR CERVICAL DECOMPRESSION/DISCECTOMY FUSION;  Surgeon: Isadora Mar, MD;  Location[facility] OR;  Service: Neurosurgery;  Laterality: N/A;   COLONOSCOPY  2008   Dr.Kaplan   EYE SURGERY     [facility]   HIP PINNING,CANNULATED Left 11/09/2015   Procedure: CANNULATED HIP PINNING;  Surgeon: Adonica Hoose, MD;  Location: [facility] ORS;  Service: Orthopedics;  Laterality: Left;   NASAL SEPTOPLASTY W/ TURBINOPLASTY Bilateral 01/12/2021   Procedure: NASAL SEPTOPLASTY WITH BILATERAL   TURBINATE REDUCTION;  Surgeon: Reynold Caves, MD;  Location: [facility];  Service: ENT;  Laterality: Bilateral;   TUBAL LIGATION     Patient Active Problem List   Diagnosis Date Noted   White matter abnormality on MRI of brain 11/17/2022   COVID-19 virus infection 03/20/2020   'Light-for-dates' infant with signs of fetal malnutrition 05/06/2019   Seizure-like activity (HCC) 12/27/2018   Dyslipidemia 12/20/2018   Hyponatremia syndrome 07/04/2018   Dyspnea on exertion 07/03/2018   COPD mixed type (HCC) 07/03/2018   MDD (major depressive disorder) 06/28/2018   GAD (generalized anxiety disorder) 06/28/2018   Major depressive disorder, recurrent episode, moderate (HCC) 04/17/2018   Head injury 12/08/2017   Cervical vertebral fusion 07/28/2017   DDD (degenerative disc disease), cervical 07/10/2017   DDD (degenerative disc disease), thoracic 07/10/2017   DDD (degenerative disc disease), lumbar 07/10/2017   Facet arthritis of cervical region 07/10/2017   Abnormal liver function tests 03/07/2017   Hypokalemia 03/07/2017   Gastroesophageal reflux disease with esophagitis 03/07/2017   Esophageal dysphagia 03/07/2017   Chronic idiopathic constipation 02/01/2017   Fatigue 02/01/2017   HSV-1 infection 01/12/2017   Osteoporosis with pathological fracture, with routine healing, subsequent encounter 08/30/2016   MDD (  major depressive disorder), recurrent episode, severe (HCC) 03/29/2016   Estrogen deficiency 12/15/2015   MS (multiple sclerosis) (HCC) 12/15/2015   Tremor 12/15/2015   Healthcare maintenance 12/15/2015   Essential hypertension    Protein-calorie malnutrition, severe 11/09/2015   Bipolar 1 disorder, depressed (HCC) 02/11/2015    ONSET DATE: 4+ years ago  REFERRING DIAG: Foot drop, left foot   THERAPY DIAG:  Other abnormalities of gait and mobility  Muscle weakness (generalized)  Unsteadiness on feet  Rationale for Evaluation and Treatment:  Rehabilitation  SUBJECTIVE:                                                                                                                                                                                             SUBJECTIVE STATEMENT: Pt denies any pain today.  Pt reports she fell last Thursday down her camper stairs with minimal LE injury.   Pt accompanied by: self  PERTINENT HISTORY: Hypertension, COPD, multiple sclerosis, osteoporosis, bipolar disorder, depression, anxiety, seizure-like activity, history of COVID-19, arthritis, and chronic kidney disease  PAIN:  Are you having pain? No  PRECAUTIONS: Fall  RED FLAGS: None   WEIGHT BEARING RESTRICTIONS: No  FALLS: Has patient fallen in last 6 months? Yes. Number of falls 5-6  LIVING ENVIRONMENT: Lives with: lives with their spouse Lives in: House/apartment Stairs: No Has following equipment at home: Walker - 4 wheeled, shower chair, and AFO  PLOF: Needs assistance with ADLs  PATIENT GOALS: improved safety and balance  OBJECTIVE:  Note: Objective measures were completed at Evaluation unless otherwise noted.  DIAGNOSTIC FINDINGS: 10/21/22 brain MRI IMPRESSION: 1. Unchanged cerebral white matter lesions, consistent with the patient's history of multiple sclerosis with superimposed chronic small-vessel disease. No new lesions or abnormal enhancement to suggest active demyelination. 2. No evidence of demyelinating disease in the cervical spinal cord. 3. Unchanged moderate neural foraminal narrowing on the right at C2-C3, bilaterally at C3-C4, and on the right at C6-C7. COGNITION: Overall cognitive status: Within functional limits for tasks assessed   SENSATION: Light touch: Impaired throughout LLE  COORDINATION: Intention tremor observed with LE objective assessments  CRANIAL NERVE SCREEN:  I: WFL  II: abnormal III: abnormal with left beating nystagmus noted when looking to the right V: WFL VII: WFL VIII: WFL   IX: abnormal since surgery X: WFL  XI: WFL  XII: WFL   DTRs:  Patella 0 = Absent and Achilles 1 = Trace   POSTURE: rounded shoulders, forward head, and flexed trunk   LOWER EXTREMITY ROM:  unable to accurately assess AROM   LOWER EXTREMITY MMT:    MMT Right Eval Left  Eval  Hip flexion 3+/5 4-/5  Hip extension    Hip abduction    Hip adduction    Hip internal rotation    Hip external rotation    Knee flexion 3+/5 4-/5  Knee extension Unable to assess due to tremor Unable to assess due to tremor  Ankle dorsiflexion    Ankle plantarflexion    Ankle inversion    Ankle eversion    (Blank rows = not tested)  TRANSFERS: Assistive device utilized: None  Sit to stand: Mod A Stand to sit: CGA  CURB:  Level of Assistance: Mod A Assistive device utilized: None Curb Comments: Patient required therapist assistance for safety  STAIRS: Level of Assistance: Modified independence Stair Negotiation Technique: Sideways with Single Rail on Right Number of Stairs: 3  Height of Stairs: 6"  Comments: Descending steps  GAIT: Gait pattern: step to pattern, decreased arm swing- Right, decreased arm swing- Left, decreased stride length, decreased hip/knee flexion- Right, decreased hip/knee flexion- Left, Right foot flat, Left foot flat, ataxic, wide BOS, poor foot clearance- Right, and poor foot clearance- Left Assistive device utilized: None Level of assistance: CGA and Max A Comments: Patient required contact-guard assistance with intermittent periods of max assistance to prevent a loss of balance                                                                                                                 TREATMENT DATE:     11/23/23     EXERCISE LOG  Exercise Repetitions and Resistance Comments  Nustep  Lvl 2 x 10 mins   Seated ham adduction 2 sets of 15 reps   Seated ham abduction Red 2 sets of 15 reps   Seated heel raise  2# 2 sets of 15 reps   Seated marches 2# x 2 mins    Rows Yellow 2 sets of 15 reps   Extensions Yellow 2 sets of 15 reps    Blank cell = exercise not performed today                                    10/25/23 EXERCISE LOG  Exercise Repetitions and Resistance Comments  Nustep  Lvl 2 x 15 mins TE  Heel Raises w/ Ball Squeeze 3 sets of 8 reps NR  LAQ     Seated marching with arm  3 x 12 reps each  Alternating LE NR   Seated Ham Curls    Seated hip ADD isometric  3 sets of 8 reps 2 second hold  Ball squeeze TE   Toe raises   3 sets of 12 reps  NR   Blank cell = exercise not performed today                                   PATIENT EDUCATION: Education details: safety, walking, and prognosis Person  educated: Patient Education method: Explanation Education comprehension: verbalized understanding  HOME EXERCISE PROGRAM:   GOALS: Goals reviewed with patient? Yes  LONG TERM GOALS: Target date: 10/25/23  Patient will be independent with her HEP. Baseline:  Goal status: IN PROGRESS  2.  Patient will be able to independently transfer from sitting to standing for improved household mobility. Baseline:  Goal status: IN PROGRESS  3.  Patient will improve her right lower extremity strength to at least 4-/5 for improved functional mobility. Baseline: 5/29: 4+/5 knee extensors and flexors; 3+/5 right hip flexors, hip abductors and adductors Goal status: IN PROGRESS  ASSESSMENT:  CLINICAL IMPRESSION: Pt arrives for today's treatment session denying any pain, but reports that she had a fall last Thursday while on vacation.  Pt reports she bruised BLE, but nothing else.  Pt able to tolerate increased resistance with seated LE exercises today with minimal fatigue.  Pt also able to tolerate increased reps with UE exercises today.  Pt given seated rest breaks as needed due to fatigue.  Pt able to perform sit to stand transfer from a chair with supervision and no UE support.  Pt requiring use of momentum to perform transfer.  Pt able to  demonstrate increased RLE strength as indicated in the goals section.  Pt has made good progress toward all of her goals at this time.  Pt denied any pain at completion of today's treatment session and was able to ambulate to her car alone.  OBJECTIVE IMPAIRMENTS: Abnormal gait, decreased activity tolerance, decreased balance, decreased coordination, decreased mobility, difficulty walking, decreased ROM, decreased strength, dizziness, impaired sensation, and postural dysfunction.   ACTIVITY LIMITATIONS: carrying, bending, standing, squatting, stairs, transfers, bed mobility, bathing, and locomotion level  PARTICIPATION LIMITATIONS: meal prep, cleaning, laundry, shopping, and community activity  PERSONAL FACTORS: Past/current experiences, Time since onset of injury/illness/exacerbation, and 3+ comorbidities: Hypertension, COPD, multiple sclerosis, osteoporosis, bipolar disorder, depression, anxiety, seizure-like activity, history of COVID-19, arthritis, and chronic kidney disease are also affecting patient's functional outcome.   REHAB POTENTIAL: Fair    CLINICAL DECISION MAKING: Unstable/unpredictable  EVALUATION COMPLEXITY: High  PLAN:  PT FREQUENCY: 2x/week  PT DURATION: 4 weeks  PLANNED INTERVENTIONS: 97164- PT Re-evaluation, 97110-Therapeutic exercises, 97530- Therapeutic activity, 97112- Neuromuscular re-education, 97535- Self Care, 16109- Manual therapy, [phone]- Gait training, Patient/Family education, Balance training, and Stair training  PLAN FOR NEXT SESSION:  Nustep, activity modification, lower extremity strengthening, and introduce gait training   Deryl Flora, PTA 11/23/2023, 11:46 AM

## 2023-11-28 ENCOUNTER — Encounter

## 2023-11-29 ENCOUNTER — Encounter

## 2023-11-30 ENCOUNTER — Encounter

## 2023-12-08 ENCOUNTER — Encounter: Payer: Self-pay | Admitting: Physician Assistant

## 2023-12-08 ENCOUNTER — Ambulatory Visit: Admitting: Physician Assistant

## 2023-12-08 DIAGNOSIS — Z638 Other specified problems related to primary support group: Secondary | ICD-10-CM | POA: Diagnosis not present

## 2023-12-08 DIAGNOSIS — G4700 Insomnia, unspecified: Secondary | ICD-10-CM

## 2023-12-08 DIAGNOSIS — F4323 Adjustment disorder with mixed anxiety and depressed mood: Secondary | ICD-10-CM | POA: Diagnosis not present

## 2023-12-08 MED ORDER — MIRTAZAPINE 15 MG PO TABS: 15.0000 mg | ORAL_TABLET | Freq: Every evening | ORAL | 1 refills | Status: DC | PRN

## 2023-12-08 MED ORDER — CLONAZEPAM 1 MG PO TABS: 1.0000 mg | ORAL_TABLET | Freq: Three times a day (TID) | ORAL | 3 refills | Status: DC

## 2023-12-08 NOTE — Progress Notes (Signed)
 [facility] Med Check  Patient ID: Angel Maddox,  MRN: [id number]  PCP: Theoplis Fix, MD  Date of Evaluation: 12/08/2023 Time spent:20 minutes  HISTORY/CURRENT STATUS: For routine med check.    We increased the Wellbutrin  at [facility] 6 weeks ago.  It has really helped a lot. The stress of her grandkids other grandmother is still a big problem.  I let her get to me.  She won't tell Amela about events in the kids lives and still doing everything she can to turn the grandkids against her. It makes her even more sad b/c the grandkids are her son, [name]'s kids. He died several years ago. I don't cry as much as I did before we increased the Wellbutrin , so that's good. Appetite is nl, wt stable. Hygiene nl. Sleeps pretty good. Not having PA but does get overwhelmed with everything that's going on. Klonopin  helps. Sleeps ok w/ Mirtzapine.  She hasn't been in therapy lately b/c she's had so many other appts.  I'm not going to kill myself but there are times I wish the [facility] would take me home so I wouldn't have to deal with all this. No HI.   Patient denies increased energy with decreased need for sleep, increased talkativeness, racing thoughts, impulsivity or risky behaviors, increased spending, increased libido, grandiosity, increased irritability or anger, paranoia, or hallucinations.  Wears a brace on her LLE to help walk and balance. MS is stable.  Denies dizziness, syncope, seizures, numbness, tingling, tremor, tics, slurred speech, confusion.  Denies dystonia.  Denies unexplained weight loss, frequent infections, or sores that heal slowly.  No polyphagia, polydipsia, or polyuria. Denies visual changes or paresthesias.   Individual Medical History/ Review of Systems: Changes? :No     Past medications for mental health diagnoses include: Depakote, Lamictal  caused insomnia, constipation, and tremor, Risperdal , Celexa , Klonopin , trazodone , Wellbutrin , Vistaril , primidone , gabapentin , Prozac,  Zyprexa, Ambien , Lunesta, Effexor  Allergies: Hydrocodone   Current Medications:  Current Outpatient Medications:    amLODipine  (NORVASC ) 10 MG tablet, Take 1 tablet (10 mg total) by mouth daily., Disp: 90 tablet, Rfl: 1   baclofen (LIORESAL) 10 MG tablet, Take 10 mg by mouth 2 (two) times daily. Takes one in the morning and one at noon, Disp: , Rfl:    buPROPion  (WELLBUTRIN  XL) 150 MG 24 hr tablet, TAKE 3 TABLETS BY MOUTH DAILY., Disp: 270 tablet, Rfl: 0   citalopram  (CELEXA ) 40 MG tablet, Take 1 tablet (40 mg total) by mouth daily., Disp: 30 tablet, Rfl: 11   dalfampridine 10 MG TB12, 2 (two) times daily., Disp: , Rfl:    dicyclomine  (BENTYL ) 10 MG capsule, TAKE 1 CAPSULE (10 MG TOTAL) BY MOUTH EVERY 8 (EIGHT) HOURS AS NEEDED FOR SPASMS., Disp: 30 capsule, Rfl: 1   gabapentin  (NEURONTIN ) 300 MG capsule, Take 2 capsules (600 mg total) by mouth 3 (three) times daily., Disp: 180 capsule, Rfl: 0   linaclotide  (LINZESS ) 72 MCG capsule, Take 1 capsule (72 mcg total) by mouth daily before breakfast., Disp: 30 capsule, Rfl: 1   losartan  (COZAAR ) 25 MG tablet, TAKE 1 TABLET BY MOUTH EVERY DAY, Disp: 90 tablet, Rfl: 1   omeprazole  (PRILOSEC) 40 MG capsule, Take 1 capsule (40 mg total) by mouth daily., Disp: 90 capsule, Rfl: 3   ondansetron  (ZOFRAN -ODT) 4 MG disintegrating tablet, TAKE 1 TABLET BY MOUTH EVERY 8 HOURS AS NEEDED FOR NAUSEA AND VOMITING, Disp: 30 tablet, Rfl: 2   Oxycodone  HCl 10 MG TABS, Take 10 mg by mouth 3 (three) times  daily as needed., Disp: , Rfl:    primidone  (MYSOLINE ) 250 MG tablet, Take 250 mg by mouth 3 (three) times daily., Disp: , Rfl:    tiZANidine  (ZANAFLEX ) 4 MG capsule, Take 4 mg by mouth at bedtime., Disp: , Rfl:    clonazePAM  (KLONOPIN ) 1 MG tablet, Take 1 tablet (1 mg total) by mouth 3 (three) times daily., Disp: 90 tablet, Rfl: 3   mirtazapine  (REMERON ) 15 MG tablet, Take 1 tablet (15 mg total) by mouth at bedtime as needed., Disp: 90 tablet, Rfl: 1   Ofatumumab  (KESIMPTA) 20 MG/0.4ML SOAJ, Inject 20 mg into the skin every 30 (thirty) days. (Patient not taking: Reported on 12/08/2023), Disp: , Rfl:    psyllium (METAMUCIL) 58.6 % powder, Take 1 packet by mouth daily. (Patient not taking: Reported on 12/08/2023), Disp: , Rfl:  Medication Side Effects: none  Family Medical/ Social History: Changes? No   MENTAL HEALTH EXAM:  There were no vitals taken for this visit.There is no height or weight on file to calculate BMI.  General Appearance: Casual, Neat and Well Groomed  Eye Contact:  Good  Speech:  Clear and Coherent and Normal Rate  Volume:  Normal  Mood:  Euthymic  Affect:  Congruent  Thought Process:  Goal Directed and Descriptions of Associations: Circumstantial  Orientation:  Full (Time, Place, and Person)  Thought Content: Logical   Suicidal Thoughts:  No  Homicidal Thoughts:  No  Memory:  WNL  Judgement:  Good  Insight:  Good  Psychomotor Activity:  walks slowly, has brace on left lower leg  Concentration:  Concentration: Good  Recall:  Good  Fund of Knowledge: Good  Language: Good  Assets:  Desire for Improvement Financial Resources/Insurance Housing Resilience Transportation  ADLs nl  Cognition: WNL  Prognosis:  Good   DIAGNOSES:    ICD-10-CM   1. Situational mixed anxiety and depressive disorder  F43.23     2. Insomnia, unspecified type  G47.00     3. Stress due to family tension  Z63.8      Receiving Psychotherapy: No  Reid Capuchin, LCSW, but not recently  RECOMMENDATIONS:  PDMP was reviewed.  Klonopin  filled 11/15/2023. Gabapentin  filled 10/23/2023.  Oxycodone  given 11/02/2023. I provided approx 20  minutes of face to face time during this encounter, including time spent before and after the visit in records review, medical decision making, counseling pertinent to today's visit, and charting.   Increasing the Wellbutrin  has helped w/ the severity of depression, so will continue same dose, as well as other meds.    Contract for safety in place. Call the office on-call service, 988/hotline, 911, or present to [facility] or ER if any life-threatening psychiatric crisis. Patient verbalizes understanding.   Continue Wellbutrin  XL 450 mg daily. Continue Celexa  40 mg, 1 p.o. daily. Continue Klonopin  1 mg, 1 po tid prn. Continue Gabapentin  300 mg, 2 po tid per another provider. Continue Mirtazapine  15 mg, 1/2-1 po at bedtime prn sleep.  Get back in therapy with Reid Capuchin, LCSW.  Return in 3 months.    Marvia Slocumb, PA-C

## 2023-12-26 DIAGNOSIS — E785 Hyperlipidemia, unspecified: Secondary | ICD-10-CM | POA: Diagnosis not present

## 2023-12-26 DIAGNOSIS — F418 Other specified anxiety disorders: Secondary | ICD-10-CM | POA: Diagnosis not present

## 2023-12-26 DIAGNOSIS — K219 Gastro-esophageal reflux disease without esophagitis: Secondary | ICD-10-CM | POA: Diagnosis not present

## 2023-12-26 DIAGNOSIS — Z87891 Personal history of nicotine dependence: Secondary | ICD-10-CM | POA: Diagnosis not present

## 2023-12-26 DIAGNOSIS — J4489 Other specified chronic obstructive pulmonary disease: Secondary | ICD-10-CM | POA: Diagnosis not present

## 2023-12-26 DIAGNOSIS — Z01818 Encounter for other preprocedural examination: Secondary | ICD-10-CM | POA: Diagnosis not present

## 2023-12-26 DIAGNOSIS — I10 Essential (primary) hypertension: Secondary | ICD-10-CM | POA: Diagnosis not present

## 2023-12-26 DIAGNOSIS — J300 Vasomotor rhinitis: Secondary | ICD-10-CM | POA: Diagnosis not present

## 2023-12-28 DIAGNOSIS — R9431 Abnormal electrocardiogram [ECG] [EKG]: Secondary | ICD-10-CM | POA: Diagnosis not present

## 2024-01-07 DIAGNOSIS — I129 Hypertensive chronic kidney disease with stage 1 through stage 4 chronic kidney disease, or unspecified chronic kidney disease: Secondary | ICD-10-CM | POA: Diagnosis not present

## 2024-01-07 DIAGNOSIS — S6391XA Sprain of unspecified part of right wrist and hand, initial encounter: Secondary | ICD-10-CM | POA: Diagnosis not present

## 2024-01-07 DIAGNOSIS — S66911A Strain of unspecified muscle, fascia and tendon at wrist and hand level, right hand, initial encounter: Secondary | ICD-10-CM | POA: Diagnosis not present

## 2024-01-07 DIAGNOSIS — W010XXA Fall on same level from slipping, tripping and stumbling without subsequent striking against object, initial encounter: Secondary | ICD-10-CM | POA: Diagnosis not present

## 2024-01-07 DIAGNOSIS — S63501A Unspecified sprain of right wrist, initial encounter: Secondary | ICD-10-CM | POA: Diagnosis not present

## 2024-01-07 DIAGNOSIS — M25531 Pain in right wrist: Secondary | ICD-10-CM | POA: Diagnosis not present

## 2024-01-07 DIAGNOSIS — N189 Chronic kidney disease, unspecified: Secondary | ICD-10-CM | POA: Diagnosis not present

## 2024-01-08 DIAGNOSIS — J3489 Other specified disorders of nose and nasal sinuses: Secondary | ICD-10-CM | POA: Diagnosis not present

## 2024-01-08 DIAGNOSIS — J343 Hypertrophy of nasal turbinates: Secondary | ICD-10-CM | POA: Diagnosis not present

## 2024-01-08 DIAGNOSIS — J348200 Internal nasal valve collapse, unspecified: Secondary | ICD-10-CM | POA: Diagnosis not present

## 2024-01-08 DIAGNOSIS — J300 Vasomotor rhinitis: Secondary | ICD-10-CM | POA: Diagnosis not present

## 2024-01-17 DIAGNOSIS — I69359 Hemiplegia and hemiparesis following cerebral infarction affecting unspecified side: Secondary | ICD-10-CM | POA: Diagnosis not present

## 2024-01-17 DIAGNOSIS — Z299 Encounter for prophylactic measures, unspecified: Secondary | ICD-10-CM | POA: Diagnosis not present

## 2024-01-17 DIAGNOSIS — G35 Multiple sclerosis: Secondary | ICD-10-CM | POA: Diagnosis not present

## 2024-01-17 DIAGNOSIS — S62009A Unspecified fracture of navicular [scaphoid] bone of unspecified wrist, initial encounter for closed fracture: Secondary | ICD-10-CM | POA: Diagnosis not present

## 2024-01-17 DIAGNOSIS — I10 Essential (primary) hypertension: Secondary | ICD-10-CM | POA: Diagnosis not present

## 2024-01-21 ENCOUNTER — Other Ambulatory Visit: Payer: Self-pay | Admitting: Physician Assistant

## 2024-01-23 DIAGNOSIS — M25531 Pain in right wrist: Secondary | ICD-10-CM | POA: Diagnosis not present

## 2024-01-23 DIAGNOSIS — S63501A Unspecified sprain of right wrist, initial encounter: Secondary | ICD-10-CM | POA: Diagnosis not present

## 2024-01-23 DIAGNOSIS — S66911A Strain of unspecified muscle, fascia and tendon at wrist and hand level, right hand, initial encounter: Secondary | ICD-10-CM | POA: Diagnosis not present

## 2024-01-26 ENCOUNTER — Telehealth: Payer: Self-pay | Admitting: Gastroenterology

## 2024-01-26 NOTE — Telephone Encounter (Signed)
 Left message for pt to call back

## 2024-01-26 NOTE — Telephone Encounter (Signed)
 Inbound call from patient stating that on Monday she starting having issues with diarrhea. Patient is having issues with Fecal incontinence. Patient states she has been to the bathroom 5 times this morning and has not ate anything to cause her to use the bathroom. Patient has tried Pepcid  and Imodium. She states the Imodium has helped slow it down, but its still continuing. Patient is requesting a call back to discuss. Please advise.

## 2024-01-29 ENCOUNTER — Other Ambulatory Visit: Payer: Self-pay

## 2024-01-29 DIAGNOSIS — R197 Diarrhea, unspecified: Secondary | ICD-10-CM

## 2024-01-29 DIAGNOSIS — R159 Full incontinence of feces: Secondary | ICD-10-CM

## 2024-01-29 NOTE — Telephone Encounter (Signed)
 Received call from patient regarding ongoing symptoms, waiting for returning call.

## 2024-01-29 NOTE — Telephone Encounter (Signed)
 Angel Maddox, patient is known by Dr. Leigh, in the past was treated for constipation. Pls make sure she is not taking any Linzess  or other laxatives. Pls verify if she's taken any antibiotic within the past 3 to 4 months. Pls send her to our lab for a CBC, CMP, CRP and GI pathogen panel to include C. Diff if that test is covered by her insurance. No dairy products x 2 weeks. Push fluids and maintain a bland diet. Schedule patient for an office visit if diarrhea persists or worsens. THX.

## 2024-01-29 NOTE — Telephone Encounter (Signed)
 Pt states she has had 2 occasions where she had diarrhea with fecal incontinence and messed all over herself. This am she thought she was just going to urinate and she had diarrhea in her panties. Reports she has already had 3 diarrhea stools this am. She has tried kaopectate, pepto and Imodium. Reports imodium helped the most but she is still having diarrhea. She denies taking any antibiotics recently. Pt wants to know what to do. Please advise.

## 2024-01-29 NOTE — Telephone Encounter (Signed)
 Spoke with pt and she reports she has not taken linzess  in a long time. States she did have nose surgery 3-4 weekd ago and thinks they gave her an antibiotic then. Lab orders in epic. Pt knows to come for labs and is aware of recommendations per Elida Shawl NP.

## 2024-01-30 ENCOUNTER — Other Ambulatory Visit (INDEPENDENT_AMBULATORY_CARE_PROVIDER_SITE_OTHER)

## 2024-01-30 DIAGNOSIS — R159 Full incontinence of feces: Secondary | ICD-10-CM

## 2024-01-30 DIAGNOSIS — R197 Diarrhea, unspecified: Secondary | ICD-10-CM | POA: Diagnosis not present

## 2024-01-30 LAB — COMPREHENSIVE METABOLIC PANEL WITH GFR
ALT: 13 U/L (ref 0–35)
AST: 17 U/L (ref 0–37)
Albumin: 3.8 g/dL (ref 3.5–5.2)
Alkaline Phosphatase: 139 U/L — ABNORMAL HIGH (ref 39–117)
BUN: 8 mg/dL (ref 6–23)
CO2: 31 meq/L (ref 19–32)
Calcium: 8.5 mg/dL (ref 8.4–10.5)
Chloride: 104 meq/L (ref 96–112)
Creatinine, Ser: 0.85 mg/dL (ref 0.40–1.20)
GFR: 70.7 mL/min (ref >60.00)
Glucose, Bld: 74 mg/dL (ref 70–99)
Potassium: 3.8 meq/L (ref 3.5–5.1)
Sodium: 142 meq/L (ref 135–145)
Total Bilirubin: 0.2 mg/dL (ref 0.2–1.2)
Total Protein: 6.5 g/dL (ref 6.0–8.3)

## 2024-01-30 LAB — CBC WITH DIFFERENTIAL/PLATELET
Basophils Absolute: 0.1 K/uL (ref 0.0–0.1)
Basophils Relative: 1.1 % (ref 0.0–3.0)
Eosinophils Absolute: 0.2 K/uL (ref 0.0–0.7)
Eosinophils Relative: 2.9 % (ref 0.0–5.0)
HCT: 40.3 % (ref 36.0–46.0)
Hemoglobin: 13.2 g/dL (ref 12.0–15.0)
Lymphocytes Relative: 34.4 % (ref 12.0–46.0)
Lymphs Abs: 2.8 K/uL (ref 0.7–4.0)
MCHC: 32.8 g/dL (ref 30.0–36.0)
MCV: 93.7 fl (ref 78.0–100.0)
Monocytes Absolute: 0.6 K/uL (ref 0.1–1.0)
Monocytes Relative: 7.8 % (ref 3.0–12.0)
Neutro Abs: 4.4 K/uL (ref 1.4–7.7)
Neutrophils Relative %: 53.8 % (ref 43.0–77.0)
Platelets: 370 K/uL (ref 150.0–400.0)
RBC: 4.3 Mil/uL (ref 3.87–5.11)
RDW: 14 % (ref 11.5–15.5)
WBC: 8.1 K/uL (ref 4.0–10.5)

## 2024-01-30 LAB — C-REACTIVE PROTEIN: CRP: 3 mg/dL (ref 0.5–20.0)

## 2024-01-30 NOTE — Progress Notes (Unsigned)
 SABRA

## 2024-01-31 ENCOUNTER — Ambulatory Visit: Payer: Self-pay | Admitting: Nurse Practitioner

## 2024-01-31 ENCOUNTER — Other Ambulatory Visit

## 2024-01-31 ENCOUNTER — Other Ambulatory Visit: Payer: Self-pay

## 2024-01-31 DIAGNOSIS — R197 Diarrhea, unspecified: Secondary | ICD-10-CM

## 2024-01-31 DIAGNOSIS — R748 Abnormal levels of other serum enzymes: Secondary | ICD-10-CM

## 2024-01-31 DIAGNOSIS — R159 Full incontinence of feces: Secondary | ICD-10-CM

## 2024-01-31 NOTE — Telephone Encounter (Signed)
 Patient returning call. States she would like to schedule for mri/mrcp.   Transferred patient to radiology to schedule.   Please f/u if needing further details.

## 2024-01-31 NOTE — Telephone Encounter (Signed)
 Patient states they are returning call. Please advise.

## 2024-02-02 ENCOUNTER — Telehealth: Payer: Self-pay | Admitting: *Deleted

## 2024-02-02 DIAGNOSIS — J300 Vasomotor rhinitis: Secondary | ICD-10-CM | POA: Diagnosis not present

## 2024-02-02 DIAGNOSIS — J3489 Other specified disorders of nose and nasal sinuses: Secondary | ICD-10-CM | POA: Diagnosis not present

## 2024-02-02 LAB — GI PROFILE, STOOL, PCR

## 2024-02-02 MED ORDER — VANCOMYCIN HCL 125 MG PO CAPS: 125.0000 mg | ORAL_CAPSULE | Freq: Four times a day (QID) | ORAL | 0 refills | Status: AC

## 2024-02-02 NOTE — Addendum Note (Signed)
 Addended by: CLAUDENE NAOMIE SAILOR on: 02/02/2024 10:29 AM   Modules accepted: Orders

## 2024-02-02 NOTE — Telephone Encounter (Signed)
 I have spoken to patient in detail about positive C Difficile. Educated on transmission and prevention. Discussed importance of getting at least 64 oz fluid in daily. I have also asked the patient to call back with an update on her symptoms in 1 week. She verbalizes understanding.

## 2024-02-02 NOTE — Telephone Encounter (Signed)
 Received a call report from [facility]-  Patients C Diff has returned positive. Please advise.[email or web address]

## 2024-02-02 NOTE — Telephone Encounter (Signed)
===  View-only below this line=== ----- Message ----- From: [place], PA-C Sent: 02/02/2024  10:16 AM EDT To: Elspeth SHAUNNA Naval, MD; [facility] A Triage Subject: + C. Diff                                      Call and notify pt. C. Diff posiitve. Rx Vancomycin  125mg  4 times day x 10 days, #40, No Refills. Give pt. Education.  Call back with update of diarrhea symptoms in 1 week. Drink 64 ounces fluids (gatorade, powerade, water) daily. [place], PA-C

## 2024-02-04 ENCOUNTER — Ambulatory Visit (HOSPITAL_COMMUNITY)

## 2024-02-08 ENCOUNTER — Telehealth: Payer: Self-pay | Admitting: Gastroenterology

## 2024-02-08 NOTE — Telephone Encounter (Signed)
 Patient returned call. Discussed need to reschedule MRI. Patient given number to call and reschedule after she completes her antibiotics. Verbalized understanding.

## 2024-02-08 NOTE — Telephone Encounter (Signed)
 Called [facility] to determine best steps for patient in regards to scheduled MRI tomorrow. Patient will need to reschedule appointment once she has completed antibiotic therapy. Attempted to reach patient to discuss and to give her the radiology scheduling phone number [phone]. No answer. Left vm for patient to return call.

## 2024-02-08 NOTE — Telephone Encounter (Signed)
 Inbound call from patient stating she would like to speak to nurse in regards to MRI scheduled for tomorrow, patient would like to know if she is able to be around people since she is on antibiotics for positive D Diff.  Requesting a call back   Please advise  Thank you

## 2024-02-09 ENCOUNTER — Other Ambulatory Visit: Payer: Self-pay | Admitting: Physician Assistant

## 2024-02-09 ENCOUNTER — Ambulatory Visit (HOSPITAL_COMMUNITY): Admission: RE | Admit: 2024-02-09 | Source: Ambulatory Visit

## 2024-02-19 ENCOUNTER — Other Ambulatory Visit (HOSPITAL_COMMUNITY)

## 2024-02-21 ENCOUNTER — Ambulatory Visit (HOSPITAL_COMMUNITY)
Admission: RE | Admit: 2024-02-21 | Discharge: 2024-02-21 | Disposition: A | Discharge status: Home / Self Care | Source: Ambulatory Visit | Attending: Nurse Practitioner | Admitting: Nurse Practitioner

## 2024-02-21 ENCOUNTER — Telehealth: Payer: Self-pay | Admitting: Gastroenterology

## 2024-02-21 ENCOUNTER — Other Ambulatory Visit: Payer: Self-pay | Admitting: Nurse Practitioner

## 2024-02-21 DIAGNOSIS — R748 Abnormal levels of other serum enzymes: Secondary | ICD-10-CM

## 2024-02-21 DIAGNOSIS — N281 Cyst of kidney, acquired: Secondary | ICD-10-CM | POA: Diagnosis not present

## 2024-02-21 DIAGNOSIS — K8020 Calculus of gallbladder without cholecystitis without obstruction: Secondary | ICD-10-CM | POA: Diagnosis not present

## 2024-02-21 DIAGNOSIS — G8929 Other chronic pain: Secondary | ICD-10-CM | POA: Diagnosis not present

## 2024-02-21 DIAGNOSIS — R109 Unspecified abdominal pain: Secondary | ICD-10-CM | POA: Diagnosis not present

## 2024-02-21 DIAGNOSIS — R197 Diarrhea, unspecified: Secondary | ICD-10-CM

## 2024-02-21 MED ORDER — GADOBUTROL 1 MMOL/ML IV SOLN
6.0000 mL | Freq: Once | INTRAVENOUS | Status: AC | PRN
  Administered 2024-02-21: 6 mL via INTRAVENOUS

## 2024-02-21 NOTE — Telephone Encounter (Signed)
 Order in epic and detailed message left on voicemail for pt.

## 2024-02-21 NOTE — Telephone Encounter (Signed)
 Inbound call from patient stating that she has been experiencing diarrhea again and feels like she has got C- Diff again. Patient is requesting a call to discuss. Please advise.

## 2024-02-21 NOTE — Telephone Encounter (Signed)
 See note below. Please advise if you want pt to come in for cdiff testing.

## 2024-02-23 ENCOUNTER — Ambulatory Visit: Payer: Self-pay | Admitting: Nurse Practitioner

## 2024-02-23 ENCOUNTER — Other Ambulatory Visit: Payer: Self-pay | Admitting: Nurse Practitioner

## 2024-02-23 ENCOUNTER — Telehealth: Payer: Self-pay | Admitting: Nurse Practitioner

## 2024-02-23 ENCOUNTER — Other Ambulatory Visit (INDEPENDENT_AMBULATORY_CARE_PROVIDER_SITE_OTHER)

## 2024-02-23 ENCOUNTER — Telehealth: Payer: Self-pay | Admitting: Gastroenterology

## 2024-02-23 DIAGNOSIS — R197 Diarrhea, unspecified: Secondary | ICD-10-CM | POA: Diagnosis not present

## 2024-02-23 MED ORDER — VANCOMYCIN HCL 125 MG PO CAPS: ORAL_CAPSULE | ORAL | 0 refills | Status: DC

## 2024-02-23 MED ORDER — SACCHAROMYCES BOULARDII 250 MG PO CAPS: 250.0000 mg | ORAL_CAPSULE | Freq: Two times a day (BID) | ORAL | 1 refills | Status: AC

## 2024-02-23 NOTE — Telephone Encounter (Signed)
 Left message for patient advising that the message from Rock was an old message and that she should follow with the recommendations given to her a couple hours ago when we talked about her symptoms. Advised again that we will reach out as soon as we have C Diff results.

## 2024-02-23 NOTE — Telephone Encounter (Signed)
 Dottie, I reviewed the patient's abdominal MRI/MRCP results and sent you a result note with instructions.  In regard to the patient's message below, feeling chronic discomfort, that is vague and I am not sure what her specific symptoms are.  She was recently treated for C. difficile (GI pathogen panel positive for C. Diff) with a course of Vancomycin  as prescribed by Ellouise Console, PA-C.  I see that repeat C. difficile stool test completed today, not sure why C. Diff  was rechecked? If the patient is no longer having diarrhea and is having solid stools, C. Diff is considered cured and retesting not required. Pls verify if patient is still having diarrhea.   I recommend scheduling the patient for an office follow-up to further address her GI symptoms.

## 2024-02-23 NOTE — Telephone Encounter (Signed)
 Received call from patient stating she is still feeling chronic discomfort, Is requesting nurse f/u for further advice and imaging/lab results. Please review and advise  Thank you

## 2024-02-23 NOTE — Telephone Encounter (Signed)
 Colleen, please see MRCP that just resulted today and advise.

## 2024-02-23 NOTE — Telephone Encounter (Signed)
 Refer to [employer]. Patient previously tested positive for C. Diff per GI pathogen panel 01/31/2024.  Patient was prescribed Vancomycin  125 mg 1 tab 4 times daily x 10 days by Ellouise Console, PA-C 02/02/2024.  Patient sent secure chat message today with recurrent diarrhea, up to 10 episodes daily with abdominal cramping. Diarrhea restarted on Monday 02/19/2024.  I called the patient and she verified her diarrhea initially abated after taking the above course of Vancomycin  but recurred 2 weeks after she completed Vancomycin .  Patient stated she submitted another stool test for C. difficile earlier today and requested the results.  I explained to the patient that her C. difficile test will remain positive at this juncture.  Due to her recurrent significant diarrhea there is high suspicion for recurrent/relapsed C. difficile.  I sent a prescription to the patient's pharmacy for Vancomycin  with a prolonged taper as follows: Vancomycin  125 mg 1 capsule p.o. 4 times daily x 2 weeks then 125 mg 1 capsule p.o. 3 times daily x 1 week then 1 capsule p.o. twice daily x 1 week then 1 capsule p.o. daily x 1 week then 1 capsule p.o. every third day x 4 weeks.  I also sent a prescription for Florastor probiotic 1 p.o. twice daily x 4 weeks with 1 refill.  I instructed the patient to go to the emergency room if she has worsening diarrhea, worsening abdominal pain/cramping or decreased urine output.  Patient instructed to push fluids, bland diet.  Patient to contact office after Labor Day holiday on Tuesday 02/27/2024  Patient will need a follow-up appointment in office in 2 to 3 weeks

## 2024-02-23 NOTE — Telephone Encounter (Signed)
 See 02/23/24 results follow up message for additional details.

## 2024-02-23 NOTE — Telephone Encounter (Signed)
 Inbound call from patient stating she received a call from [place] regarding bringing back her C. Diff stool kit. States her husband brought back stool kit this morning. Requesting a call to discuss when she is going to know results and how to proceed. Advised that once results are back a nurse will give her call with next steps. Patient states she does not want to suffer all weekend. Requesting a call back. Please advise, thank you

## 2024-02-24 LAB — CLOSTRIDIUM DIFFICILE BY PCR: Toxigenic C. Difficile by PCR: POSITIVE — AB

## 2024-02-27 ENCOUNTER — Ambulatory Visit: Payer: Self-pay | Admitting: Physician Assistant

## 2024-02-27 ENCOUNTER — Telehealth: Payer: Self-pay | Admitting: Physician Assistant

## 2024-02-27 NOTE — Progress Notes (Signed)
 Patient was notified of positive C. difficile test result 02/23/2024 by Elida Shawl, NP.  Colleen sent a prescription to the patient's pharmacy for Vancomycin  with a prolonged taper as follows: Vancomycin  125 mg 1 capsule p.o. 4 times daily x 2 weeks then 125 mg 1 capsule p.o. 3 times daily x 1 week then 1 capsule p.o. twice daily x 1 week then 1 capsule p.o. daily x 1 week then 1 capsule p.o. every third day x 4 weeks.   She also sent a prescription for Florastor probiotic 1 p.o. twice daily x 4 weeks with 1 refill.  Dr. Hassan patient.  Please call and ask patient how is her diarrhea?  Please schedule patient follow-up appointment for recurrent C. difficile in the next 2 to 4 weeks.  Ellouise Console, PA-C

## 2024-02-27 NOTE — Telephone Encounter (Signed)
 Patient returning a call  Requesting a call back  Please advise  Thank you

## 2024-02-28 NOTE — Telephone Encounter (Signed)
 See result note.

## 2024-03-05 ENCOUNTER — Encounter: Payer: Self-pay | Admitting: Physician Assistant

## 2024-03-05 ENCOUNTER — Ambulatory Visit (INDEPENDENT_AMBULATORY_CARE_PROVIDER_SITE_OTHER): Admitting: Physician Assistant

## 2024-03-05 DIAGNOSIS — F4323 Adjustment disorder with mixed anxiety and depressed mood: Secondary | ICD-10-CM

## 2024-03-05 DIAGNOSIS — G4700 Insomnia, unspecified: Secondary | ICD-10-CM

## 2024-03-05 DIAGNOSIS — G35 Multiple sclerosis: Secondary | ICD-10-CM | POA: Diagnosis not present

## 2024-03-05 MED ORDER — BUPROPION HCL ER (XL) 150 MG PO TB24: 450.0000 mg | ORAL_TABLET | Freq: Every day | ORAL | 3 refills | Status: AC

## 2024-03-05 MED ORDER — CITALOPRAM HYDROBROMIDE 40 MG PO TABS: 40.0000 mg | ORAL_TABLET | Freq: Every day | ORAL | 3 refills | Status: AC

## 2024-03-05 MED ORDER — MIRTAZAPINE 15 MG PO TABS: 15.0000 mg | ORAL_TABLET | Freq: Every evening | ORAL | 3 refills | Status: DC | PRN

## 2024-03-05 NOTE — Progress Notes (Signed)
 [facility]  Patient ID: Angel Maddox,  MRN: [id number]  PCP: Maree Isles, MD  Date of Evaluation: 03/05/2024 Time spent:25 minutes  HISTORY/CURRENT STATUS: For routine med check.    She's had several health issues since her LOV.  See ROS. If it wasn't for all that I'd be doing good.   Energy and motivation are fair.  No extreme sadness, tearfulness, or feelings of hopelessness.  Sleeps ok. ADLs and personal hygiene are normal.  Went to the grocery store recently which she hasn't done in awhile.  Denies any changes in concentration, making decisions, or remembering things.  Appetite is fair.  No SI/HI.  Patient denies increased energy with decreased need for sleep, increased talkativeness, racing thoughts, impulsivity or risky behaviors, increased spending, grandiosity, increased irritability or anger, paranoia, or hallucinations.  Individual Medical History/ Review of Systems: Changes? :Yes  had nasal surgery twice. Has C Diff, on Vancomycin . Has fallen a few times, once she broke a rib on the left side, and another time she fx right wrist.   Past medications for mental health diagnoses include: Depakote, Lamictal  caused insomnia, constipation, and tremor, Risperdal , Celexa , Klonopin , trazodone , Wellbutrin , Vistaril , primidone , gabapentin , Prozac, Zyprexa, Ambien , Lunesta, Effexor  Allergies: Hydrocodone   Current Medications:  Current Outpatient Medications:    amLODipine  (NORVASC ) 10 MG tablet, Take 1 tablet (10 mg total) by mouth daily., Disp: 90 tablet, Rfl: 1   clonazePAM  (KLONOPIN ) 1 MG tablet, Take 1 tablet (1 mg total) by mouth 3 (three) times daily., Disp: 90 tablet, Rfl: 3   dalfampridine 10 MG TB12, 2 (two) times daily., Disp: , Rfl:    dicyclomine  (BENTYL ) 10 MG capsule, TAKE 1 CAPSULE (10 MG TOTAL) BY MOUTH EVERY 8 (EIGHT) HOURS AS NEEDED FOR SPASMS., Disp: 30 capsule, Rfl: 1   gabapentin  (NEURONTIN ) 300 MG capsule, Take 2 capsules (600 mg total) by mouth  3 (three) times daily., Disp: 180 capsule, Rfl: 0   losartan  (COZAAR ) 25 MG tablet, TAKE 1 TABLET BY MOUTH EVERY DAY, Disp: 90 tablet, Rfl: 1   omeprazole  (PRILOSEC) 40 MG capsule, Take 1 capsule (40 mg total) by mouth daily., Disp: 90 capsule, Rfl: 3   ondansetron  (ZOFRAN -ODT) 4 MG disintegrating tablet, TAKE 1 TABLET BY MOUTH EVERY 8 HOURS AS NEEDED FOR NAUSEA AND VOMITING, Disp: 30 tablet, Rfl: 2   Oxycodone  HCl 10 MG TABS, Take 10 mg by mouth 3 (three) times daily as needed., Disp: , Rfl:    primidone  (MYSOLINE ) 250 MG tablet, Take 250 mg by mouth 3 (three) times daily., Disp: , Rfl:    saccharomyces boulardii (FLORASTOR) 250 MG capsule, Take 1 capsule (250 mg total) by mouth 2 (two) times daily., Disp: 60 capsule, Rfl: 1   tiZANidine  (ZANAFLEX ) 4 MG capsule, Take 4 mg by mouth at bedtime., Disp: , Rfl:    vancomycin  (VANCOCIN ) 125 MG capsule, VANCOMYCIN  125MG  ONE CAPSULE PO QID X 14 DAYS THEN ONE CAPSULE BY PO TID X 7 DAYS THEN ONE CAPSULE PO BID X 7 DAYS THEN ONE CAPSULE PO ONCE DAILY X 7 DAYS THEN ONE CAPSULE PO EVERY THIRD DAY X 4 WEEKS., Disp: 108 capsule, Rfl: 0   baclofen (LIORESAL) 10 MG tablet, Take 10 mg by mouth 2 (two) times daily. Takes one in the morning and one at noon (Patient not taking: Reported on 03/05/2024), Disp: , Rfl:    buPROPion  (WELLBUTRIN  XL) 150 MG 24 hr tablet, Take 3 tablets (450 mg total) by mouth daily., Disp: 270 tablet, Rfl: 3  citalopram  (CELEXA ) 40 MG tablet, Take 1 tablet (40 mg total) by mouth daily., Disp: 90 tablet, Rfl: 3   linaclotide  (LINZESS ) 72 MCG capsule, Take 1 capsule (72 mcg total) by mouth daily before breakfast. (Patient not taking: Reported on 03/05/2024), Disp: 30 capsule, Rfl: 1   mirtazapine  (REMERON ) 15 MG tablet, Take 1 tablet (15 mg total) by mouth at bedtime as needed., Disp: 90 tablet, Rfl: 3   Ofatumumab (KESIMPTA) 20 MG/0.4ML SOAJ, Inject 20 mg into the skin every 30 (thirty) days. (Patient not taking: Reported on 03/05/2024), Disp: , Rfl:     psyllium (METAMUCIL) 58.6 % powder, Take 1 packet by mouth daily. (Patient not taking: Reported on 12/08/2023), Disp: , Rfl:  Medication Side Effects: none  Family Medical/ Social History: Changes? Husband has cath d/t kidney stones and enlarged prostate  MENTAL HEALTH EXAM:  There were no vitals taken for this visit.There is no height or weight on file to calculate BMI.  General Appearance: Casual and Well Groomed  Eye Contact:  Good  Speech:  Clear and Coherent and Normal Rate  Volume:  Normal  Mood:  Euthymic  Affect:  Congruent  Thought Process:  Goal Directed and Descriptions of Associations: Circumstantial  Orientation:  Full (Time, Place, and Person)  Thought Content: Logical   Suicidal Thoughts:  No  Homicidal Thoughts:  No  Memory:  WNL  Judgement:  Good  Insight:  Good  Psychomotor Activity:  Normal and had a brace on LLE  Concentration:  Concentration: Good  Recall:  Good  Fund of Knowledge: Good  Language: Good  Assets:  Communication Skills Desire for Improvement Financial Resources/Insurance Housing Resilience Transportation  ADLs nl  Cognition: WNL  Prognosis:  Good   DIAGNOSES:    ICD-10-CM   1. Situational mixed anxiety and depressive disorder  F43.23     2. Insomnia, unspecified type  G47.00     3. MS (multiple sclerosis) (HCC)  G35       Receiving Psychotherapy: No  Marval Bunde, LCSW, but not recently  RECOMMENDATIONS:  PDMP was reviewed.  Klonopin  filled 02/20/2024.  Gabapentin  filled 01/18/2024.  Also on oxycodone . I provided approximately 25 minutes of face to face time during this encounter, including time spent before and after the visit in records review, medical decision making, counseling pertinent to today's visit, and charting.   As far as her mental health medications go she is doing well so no changes will be made.  Reminded her that Klonopin  can cause imbalance and dizziness so take as sparingly as possible.  She understands and  states the falls are related to the MS, not Klonopin .  Continue Wellbutrin  XL 450 mg daily. Continue Celexa  40 mg, 1 p.o. daily. Continue Klonopin  1 mg, 1 po tid prn. Continue Gabapentin  300 mg, 2 po tid per another provider. Continue Mirtazapine  15 mg, 1/2-1 po at bedtime prn sleep.  Get back in therapy with Marval Bunde, LCSW.  Return in 3 months.    Verneita Cooks, PA-C

## 2024-03-07 DIAGNOSIS — J300 Vasomotor rhinitis: Secondary | ICD-10-CM | POA: Diagnosis not present

## 2024-03-11 ENCOUNTER — Encounter: Payer: Self-pay | Admitting: Nurse Practitioner

## 2024-03-11 ENCOUNTER — Ambulatory Visit (INDEPENDENT_AMBULATORY_CARE_PROVIDER_SITE_OTHER): Admitting: Nurse Practitioner

## 2024-03-11 VITALS — BP 142/74 | HR 69 | Ht 66.0 in | Wt 135.0 lb

## 2024-03-11 DIAGNOSIS — A0472 Enterocolitis due to Clostridium difficile, not specified as recurrent: Secondary | ICD-10-CM | POA: Diagnosis not present

## 2024-03-11 DIAGNOSIS — Z8719 Personal history of other diseases of the digestive system: Secondary | ICD-10-CM | POA: Diagnosis not present

## 2024-03-11 DIAGNOSIS — Z8711 Personal history of peptic ulcer disease: Secondary | ICD-10-CM | POA: Diagnosis not present

## 2024-03-11 DIAGNOSIS — K219 Gastro-esophageal reflux disease without esophagitis: Secondary | ICD-10-CM | POA: Diagnosis not present

## 2024-03-11 DIAGNOSIS — K449 Diaphragmatic hernia without obstruction or gangrene: Secondary | ICD-10-CM

## 2024-03-11 DIAGNOSIS — Z860101 Personal history of adenomatous and serrated colon polyps: Secondary | ICD-10-CM

## 2024-03-11 DIAGNOSIS — A498 Other bacterial infections of unspecified site: Secondary | ICD-10-CM

## 2024-03-11 NOTE — Progress Notes (Signed)
 I think November/December timeframe would be fine as long as she is feeling better.  Thanks

## 2024-03-11 NOTE — Patient Instructions (Addendum)
  COMPLETE THE COURSE OF VANCOMYCIN  AS PREVIOUSLY PRESCRIBED ON 02/23/2024:  VANCOMYCIN  125MG  ONE CAPSULE BY MOUTH FOUR TIME DAILY X 2 WEEKS (WHICH YOU COMPLETED)  STARTING TODAY 9/15 TAKE VANCOMYCIN  ONE CAPSULE BY MOUTH THREE TIMES DAILY FOR ONE WEEK   THEN VANCOMYCIN  ONE CAPSULE BY MOUTH TWICE DAILY FOR ONE WEEK   THEN VANCOMYCIN  ONE CAPSULE BY MOUTH FOR 7 DAYS   THEN VANCOMYCIN  ONE CAPSULE BY MOUTH EVERY THIRD DAY X 4 WEEKS THEN YOUR DONE  CONTINUE FLORASTOR PROBIOTIC FOR 4 WEEKS    CONTACT OUR OFFICE IF DIARRHEA RECURS  PLEASE FOLLOW UP WITH YOUR PRIMARY CARE PROVIDER REGARDING COGNITIVE DIFFICULTY    _______________________________________________________  If your blood pressure at your visit was 140/90 or greater, please contact your primary care physician to follow up on this.  _______________________________________________________  If you are age 68 or older, your body mass index should be between 23-30. Your Body mass index is 21.79 kg/m. If this is out of the aforementioned range listed, please consider follow up with your Primary Care Provider.  If you are age 32 or younger, your body mass index should be between 19-25. Your Body mass index is 21.79 kg/m. If this is out of the aformentioned range listed, please consider follow up with your Primary Care Provider.   ________________________________________________________  The [facility] providers would like to encourage you to use MYCHART to communicate with providers for non-urgent requests or questions.  Due to long hold times on the telephone, sending your provider a message by [facility] may be a faster and more efficient way to get a response.  Please allow 48 business hours for a response.  Please remember that this is for non-urgent requests.  _______________________________________________________  [facility] is using a team-based approach to care.  Your team is made up of your doctor and two to three  APPS. Our APPS (Nurse Practitioners and Physician Assistants) work with your physician to ensure care continuity for you. They are fully qualified to address your health concerns and develop a treatment plan. They communicate directly with your gastroenterologist to care for you. Seeing the Advanced Practice Practitioners on your physician's team can help you by facilitating care more promptly, often allowing for earlier appointments, access to diagnostic testing, procedures, and other specialty referrals.   Thank you for trusting me with your gastrointestinal care!   Elida Shawl, CRNP

## 2024-03-11 NOTE — Progress Notes (Signed)
 03/11/2024 Angel Maddox [id number] [birth year]   Chief Complaint: C. difficile follow-up  History of Present Illness: Angel Maddox. Angel Maddox is a 68 year old female with a past medical history of multiple sclerosis, osteoporosis, hiatal hernia, GERD and a gastric ulcer.  She is known by Dr. Leigh.  Patient contacted our office 01/26/2024 with new onset diarrhea.  Prior history of constipation, no longer taking laxatives. She took an antibiotic 3 to 4 weeks prior to the onset of diarrhea, prescribed following nasal septoplasty surgery 01/12/2021. GI pathogen panel tested positive for C. difficile toxin A/B 01/31/2024 and she was prescribed Vancomycin  125 mg 1 tab 4 times daily x 10 days by Angel Console, PA-C 02/02/2024.  She contacted our office late Friday afternoon 02/23/2024 and reported her diarrhea initially abated approximately 1 week after starting Vancomycin  then recurred 2 weeks after she completed the 10-day course.  She endorsed having frequent nonbloody diarrhea since around 02/19/2024.  She endorsed feeling unwell when she was concerned she had C. difficile again. At that time, I explained to the patient that she likely had relapse of C. difficile and repeat testing was not necessary. I sent a prescription to the patient's pharmacy for Vancomycin  with a prolonged taper as follows: Vancomycin  125 mg 1 capsule p.o. 4 times daily x 2 weeks then 125 mg 1 capsule p.o. 3 times daily x 1 week then 1 capsule p.o. twice daily x 1 week then 1 capsule p.o. daily x 1 week then 1 capsule p.o. every third day x 4 weeks. I also sent a prescription for Florastor probiotic 1 p.o. twice daily x 4 weeks with 1 refill.  She was instructed go to the emergency room if her diarrhea worsened.  She presents today for further follow-up.  She completed Vancomycin  125 mg 1 capsule 4 times daily x 14 days and will start the prolonged taper as documented above. She stated her diarrhea abated within 2 to 3 days after  restarting Vancomycin  and taking a probiotic.  She is passing normal formed stool once daily. She is hydrating well and is eating a fairly bland to regular diet at this juncture.  No abdominal pain.  She is traveling to [place] on 03/13/2024.  She feels she is up for this trip.     Latest Ref Rng & Units 01/30/2024    2:38 PM 12/28/2018    4:02 AM 12/27/2018    2:56 PM  CBC  WBC 4.0 - 10.5 K/uL 8.1  4.7  4.5   Hemoglobin 12.0 - 15.0 g/dL 86.7  86.9  86.2   Hematocrit 36.0 - 46.0 % 40.3  38.8  42.7   Platelets 150.0 - 400.0 K/uL 370.0  308  311        Latest Ref Rng & Units 01/30/2024    2:38 PM 07/22/2019    3:05 PM 02/04/2019    2:26 PM  CMP  Glucose 70 - 99 mg/dL 74  69    BUN 6 - 23 mg/dL 8  9    Creatinine 9.59 - 1.20 mg/dL 9.14  8.99    Sodium 864 - 145 mEq/L 142  138    Potassium 3.5 - 5.1 mEq/L 3.8  4.5    Chloride 96 - 112 mEq/L 104  98    CO2 19 - 32 mEq/L 31  25    Calcium  8.4 - 10.5 mg/dL 8.5  8.8    Total Protein 6.0 - 8.3 g/dL 6.5   7.0   Total  Bilirubin 0.2 - 1.2 mg/dL 0.2   0.2   Alkaline Phos 39 - 117 U/L 139   153   AST 0 - 37 U/L 17   30   ALT 0 - 35 U/L 13   19      Prior GI workup:  Colonoscopy 04/17/2017  - Tortuous colon. - One 6 mm tubular adenomatous polyp in the sigmoid colon, removed with a cold snare. Resected and retrieved.  - Internal hemorrhoids. - The examination was otherwise normal. - Recall colonoscopy 5 years, subsequently changed to 7 years    EGD 2008, normal    EGD 01/24/2019  - A 1 cm hiatal hernia was present. - The exam of the esophagus was otherwise normal. No stenosis / stricture appreciated. - A guidewire was placed and the scope was withdrawn. Empiric dilation was performed in the entire esophagus with a Savary dilator with mild resistance at 17 mm. Relook endoscopy showed no mucosal wrents. Biopsies were taken with a cold forceps in the upper third of the esophagus, in the middle third of the esophagus and in the lower third of the  esophagus for histology. - One non-bleeding superficial clean based gastric ulcer with no stigmata of bleeding was found at the pylorus. The lesion was 3 mm in largest dimension. - Patchy moderate inflammation characterized by erosions, erythema and friability was found in the gastric antrum. - The exam of the stomach was otherwise normal. - Biopsies were taken with a cold forceps in the gastric body, at the incisura and in the gastric antrum for Helicobacter pylori testing. - The duodenal bulb and second portion of the duodenum were normal.     1. Surgical [P], gastric antrum and gastric body - MILD REACTIVE GASTROPATHY. [place] PHLEGM IS NEGATIVE FOR HELICOBACTER PYLORI. - NO INTESTINAL METAPLASIA, DYSPLASIA, OR MALIGNANCY. 2. Surgical [P], distal, mid, proximal esophagus - BENIGN SQUAMOUS MUCOSA. - NO INCREASE IN INTRAEPITHELIAL EOSINOPHILS. - NO INTESTINAL METAPLASIA, DYSPLASIA, OR MALIGNANCY.       Latest Ref Rng & Units 01/30/2024    2:38 PM 12/28/2018    4:02 AM 12/27/2018    2:56 PM  CBC  WBC 4.0 - 10.5 K/uL 8.1  4.7  4.5   Hemoglobin 12.0 - 15.0 g/dL 86.7  86.9  86.2   Hematocrit 36.0 - 46.0 % 40.3  38.8  42.7   Platelets 150.0 - 400.0 K/uL 370.0  308  311          Latest Ref Rng & Units 01/30/2024    2:38 PM 07/22/2019    3:05 PM 02/04/2019    2:26 PM  CMP  Glucose 70 - 99 mg/dL 74  69    BUN 6 - 23 mg/dL 8  9    Creatinine 9.59 - 1.20 mg/dL 9.14  8.99    Sodium 864 - 145 mEq/L 142  138    Potassium 3.5 - 5.1 mEq/L 3.8  4.5    Chloride 96 - 112 mEq/L 104  98    CO2 19 - 32 mEq/L 31  25    Calcium  8.4 - 10.5 mg/dL 8.5  8.8    Total Protein 6.0 - 8.3 g/dL 6.5   7.0   Total Bilirubin 0.2 - 1.2 mg/dL 0.2   0.2   Alkaline Phos 39 - 117 U/L 139   153   AST 0 - 37 U/L 17   30   ALT 0 - 35 U/L 13   19     Current  Outpatient Medications on File Prior to Visit  Medication Sig Dispense Refill   amLODipine  (NORVASC ) 10 MG tablet Take 1 tablet (10 mg total) by mouth  daily. 90 tablet 1   baclofen (LIORESAL) 10 MG tablet Take 10 mg by mouth 2 (two) times daily. Takes one in the morning and one at noon     buPROPion  (WELLBUTRIN  XL) 150 MG 24 hr tablet Take 3 tablets (450 mg total) by mouth daily. 270 tablet 3   citalopram  (CELEXA ) 40 MG tablet Take 1 tablet (40 mg total) by mouth daily. 90 tablet 3   clonazePAM  (KLONOPIN ) 1 MG tablet Take 1 tablet (1 mg total) by mouth 3 (three) times daily. 90 tablet 3   dalfampridine 10 MG TB12 2 (two) times daily.     dicyclomine  (BENTYL ) 10 MG capsule TAKE 1 CAPSULE (10 MG TOTAL) BY MOUTH EVERY 8 (EIGHT) HOURS AS NEEDED FOR SPASMS. 30 capsule 1   gabapentin  (NEURONTIN ) 300 MG capsule Take 2 capsules (600 mg total) by mouth 3 (three) times daily. 180 capsule 0   losartan  (COZAAR ) 25 MG tablet TAKE 1 TABLET BY MOUTH EVERY DAY 90 tablet 1   mirtazapine  (REMERON ) 15 MG tablet Take 1 tablet (15 mg total) by mouth at bedtime as needed. 90 tablet 3   omeprazole  (PRILOSEC) 40 MG capsule Take 1 capsule (40 mg total) by mouth daily. 90 capsule 3   ondansetron  (ZOFRAN -ODT) 4 MG disintegrating tablet TAKE 1 TABLET BY MOUTH EVERY 8 HOURS AS NEEDED FOR NAUSEA AND VOMITING 30 tablet 2   Oxycodone  HCl 10 MG TABS Take 10 mg by mouth 3 (three) times daily as needed.     primidone  (MYSOLINE ) 250 MG tablet Take 250 mg by mouth 3 (three) times daily.     saccharomyces boulardii (FLORASTOR) 250 MG capsule Take 1 capsule (250 mg total) by mouth 2 (two) times daily. 60 capsule 1   tiZANidine  (ZANAFLEX ) 4 MG capsule Take 4 mg by mouth at bedtime.     vancomycin  (VANCOCIN ) 125 MG capsule VANCOMYCIN  125MG  ONE CAPSULE PO QID X 14 DAYS THEN ONE CAPSULE BY PO TID X 7 DAYS THEN ONE CAPSULE PO BID X 7 DAYS THEN ONE CAPSULE PO ONCE DAILY X 7 DAYS THEN ONE CAPSULE PO EVERY THIRD DAY X 4 WEEKS. 108 capsule 0   No current facility-administered medications on file prior to visit.   Allergies  Allergen Reactions   Hydrocodone  Itching   Current Medications,  Allergies, Past Medical History, Past Surgical History, Family History and Social History were reviewed in [employer] record.  Review of Systems:   Constitutional: Negative for fever, sweats, chills or weight loss.  Respiratory: Negative for shortness of breath.   Cardiovascular: Negative for chest pain, palpitations and leg swelling.  Gastrointestinal: See HPI.  Musculoskeletal: Negative for back pain or muscle aches.  Neurological: Negative for dizziness, headaches or paresthesias.   Physical Exam: BP (!) 142/74   Pulse 69   Ht 5' 6 (1.676 m)   Wt 135 lb (61.2 kg)   BMI 21.79 kg/m  Wt Readings from Last 3 Encounters:  03/11/24 135 lb (61.2 kg)  05/09/23 141 lb (64 kg)  11/17/22 133 lb (60.3 kg)    General: 68 year old female in no acute distress. Head: Normocephalic and atraumatic. Eyes: No scleral icterus. Conjunctiva pink . Ears: Normal auditory acuity. Mouth: Dentition intact. No ulcers or lesions.  Lungs: Clear throughout to auscultation. Heart: Regular rate and rhythm, no murmur. Abdomen: Soft, nontender  and nondistended. No masses or hepatomegaly. Normal bowel sounds x 4 quadrants.  Rectal: Deferred.  Musculoskeletal: Slow gait with leg brace secondary to MS. Extremities: No edema. Neurological: Alert oriented x 4. No focal deficits.  Psychological: Alert and cooperative. Normal mood and affect  Assessment and Recommendations:  68 year old female with C. Diff infection associated with recent antibiotic use s/p nasal septoplasty surgery. GI pathogen panel tested positive for C. difficile toxin A/B 01/31/2024,  prescribed Vancomycin  125 mg 1 tab 4 times daily x 10 days 02/02/2024 and her diarrhea resolved within one week on Vanco. However, two weeks after she completed the course of Vancomycin  she had recurrent diarrhea consistent with C. Diff relapse. I prescribed a prolonged course of Vancomycin  as follows: Vancomycin  125 mg 1 capsule p.o. 4 times  daily x 2 weeks then 125 mg 1 capsule p.o. 3 times daily x 1 week then 1 capsule p.o. twice daily x 1 week then 1 capsule p.o. daily x 1 week then 1 capsule p.o. every third day x 4 weeks. She completed Vanco qid dosing x 2 weeks, to start tid x 1 week today then continue taper as noted above.  - I very thoroughly we reviewed the vancomycin  taper instructions with the patient and she verbalized understanding - Patient to contact our office if diarrhea recurs - Patient advised to inform any provider who prescribed an antibiotic to be aware of her history of C. difficile with relapse - Prescribe Dificid if she has recurrent C. difficile - Patient is traveling to [place] with her husband 03/13/2024, she feels she is up for this trip - Patient instructed to seek medical care in [place] if diarrhea recurs - Push fluids, diet as tolerated  History of a tubular adenomatous polyp per colonoscopy 03/2017. - Patient is due for a colon polyp surveillance colonoscopy 03/2024, will perhaps allow more time to recover from C. difficile.  To discuss appropriate timing of next colonoscopy with Dr. Leigh.  In face-to-face conversation with patient today, patient demonstrated mild concerns with cognition, slight delayed response to questions. - Patient to follow-up with PCP for further evaluation  GERD, hiatal hernia and prior history of a gastric ulcer per EGD 12/2018.  No GERD symptoms. - Continue Omeprazole  40 mg once daily, if she has recurrent C. difficile will need to stop PPI  History of elevated alk phos level with normal total bili, AST and ALT levels. Abdominal MRI/MRCP 02/21/2024 showed intra and extrahepatic biliary ducts at the upper limit of normal in caliber, CBD measuring up to 0.7 cm and tapering to the ampulla without calculus or other visible obstruction, and cholelithiasis without evidence of acute cholecystitis. - No further biliary workup recommended per prior review of MRI/MRCP by Dr.  Leigh  Multiple sclerosis

## 2024-03-11 NOTE — Progress Notes (Signed)
 Agree with assessment and plan as outlined. I think we could pursue her colonoscopy perhaps in November-ish if she is otherwise feeling well and resolved at that point in time. Low threshold for retreatment for C. difficile if recurrent symptoms, would use Dificid next, and if we need to do that consider course of Vowst after done with that. If she wanted to take some vancomycin  with her to [place] just in case she has recurrence she could also do that. Can you help get her scheduled for colonoscopy November/December I think is okay.  Thanks

## 2024-03-12 ENCOUNTER — Ambulatory Visit: Admitting: Nurse Practitioner

## 2024-03-13 NOTE — Progress Notes (Signed)
 Patient is on a prolonged course of vancomycin , she will have enough supply to last throughout her trip to [place].

## 2024-03-13 NOTE — Progress Notes (Signed)
 Linda/Dottie, please call the patient in 2 weeks as she is currently on vacation and let her know Dr. Leigh recommended scheduling her for colonoscopy November or December 2025.  Please schedule her for a follow-up appointment with me end of October or early November as she is a patient who will need very clear instructions and would be appropriate to reassess her in the setting of recent C. difficile.  Thank you

## 2024-03-14 NOTE — Progress Notes (Signed)
 Pt scheduled to see Colleen 04/23/24 at 1:30pm.  Colon scheduled in the [facility] with Dr Leigh 121/18/25 at 8am.

## 2024-04-03 DIAGNOSIS — R296 Repeated falls: Secondary | ICD-10-CM | POA: Diagnosis not present

## 2024-04-03 DIAGNOSIS — F339 Major depressive disorder, recurrent, unspecified: Secondary | ICD-10-CM | POA: Diagnosis not present

## 2024-04-03 DIAGNOSIS — R531 Weakness: Secondary | ICD-10-CM | POA: Diagnosis not present

## 2024-04-03 DIAGNOSIS — M7918 Myalgia, other site: Secondary | ICD-10-CM | POA: Diagnosis not present

## 2024-04-03 DIAGNOSIS — R2689 Other abnormalities of gait and mobility: Secondary | ICD-10-CM | POA: Diagnosis not present

## 2024-04-03 DIAGNOSIS — M6281 Muscle weakness (generalized): Secondary | ICD-10-CM | POA: Diagnosis not present

## 2024-04-03 DIAGNOSIS — R143 Flatulence: Secondary | ICD-10-CM | POA: Diagnosis not present

## 2024-04-03 DIAGNOSIS — R269 Unspecified abnormalities of gait and mobility: Secondary | ICD-10-CM | POA: Diagnosis not present

## 2024-04-03 DIAGNOSIS — M21379 Foot drop, unspecified foot: Secondary | ICD-10-CM | POA: Diagnosis not present

## 2024-04-16 ENCOUNTER — Other Ambulatory Visit: Payer: Self-pay | Admitting: Nurse Practitioner

## 2024-04-23 ENCOUNTER — Ambulatory Visit: Admitting: Nurse Practitioner

## 2024-04-30 DIAGNOSIS — Z0000 Encounter for general adult medical examination without abnormal findings: Secondary | ICD-10-CM | POA: Diagnosis not present

## 2024-04-30 DIAGNOSIS — R5383 Other fatigue: Secondary | ICD-10-CM | POA: Diagnosis not present

## 2024-04-30 DIAGNOSIS — Z299 Encounter for prophylactic measures, unspecified: Secondary | ICD-10-CM | POA: Diagnosis not present

## 2024-04-30 DIAGNOSIS — Z79899 Other long term (current) drug therapy: Secondary | ICD-10-CM | POA: Diagnosis not present

## 2024-04-30 DIAGNOSIS — I10 Essential (primary) hypertension: Secondary | ICD-10-CM | POA: Diagnosis not present

## 2024-04-30 DIAGNOSIS — G35D Multiple sclerosis, unspecified: Secondary | ICD-10-CM | POA: Diagnosis not present

## 2024-04-30 DIAGNOSIS — E7800 Pure hypercholesterolemia, unspecified: Secondary | ICD-10-CM | POA: Diagnosis not present

## 2024-05-08 ENCOUNTER — Telehealth: Payer: Self-pay | Admitting: Physician Assistant

## 2024-05-08 NOTE — Telephone Encounter (Signed)
 Left message for pt to call back. Pt has h/o cdiff and reports she had 5 watery stools yesterday. Pt also concerned as lfts were elevated on recent labs with pcp. She is calling pcp office regarding lft, they called and want her to come in for a visit. Pt scheduled to see Nestor Blower PA Friday at 10:40am. Pt aware of appt.

## 2024-05-08 NOTE — Telephone Encounter (Signed)
 Inbound call from patient stating that she was not sure why she was scheduling for a colonoscopy on 11/18. She wished to cancel at this time. She wanted me to send a message to the nurse because she had labs drawn with her PCP and her liver enzymes were high and wanted to discuss because she was concerned. She also states that yesterday she had a bowel movement 5 times yesterday and it was  watery every time and is not sure if she has not C-Diff again. Please advise.

## 2024-05-10 ENCOUNTER — Other Ambulatory Visit (INDEPENDENT_AMBULATORY_CARE_PROVIDER_SITE_OTHER)

## 2024-05-10 ENCOUNTER — Encounter: Payer: Self-pay | Admitting: Gastroenterology

## 2024-05-10 ENCOUNTER — Ambulatory Visit: Admitting: Gastroenterology

## 2024-05-10 VITALS — BP 120/68 | HR 75 | Ht 66.0 in | Wt 132.2 lb

## 2024-05-10 DIAGNOSIS — Z8619 Personal history of other infectious and parasitic diseases: Secondary | ICD-10-CM | POA: Diagnosis not present

## 2024-05-10 DIAGNOSIS — K219 Gastro-esophageal reflux disease without esophagitis: Secondary | ICD-10-CM | POA: Diagnosis not present

## 2024-05-10 DIAGNOSIS — Z79899 Other long term (current) drug therapy: Secondary | ICD-10-CM | POA: Diagnosis not present

## 2024-05-10 DIAGNOSIS — G35D Multiple sclerosis, unspecified: Secondary | ICD-10-CM | POA: Diagnosis not present

## 2024-05-10 DIAGNOSIS — R7989 Other specified abnormal findings of blood chemistry: Secondary | ICD-10-CM

## 2024-05-10 DIAGNOSIS — R748 Abnormal levels of other serum enzymes: Secondary | ICD-10-CM

## 2024-05-10 LAB — CBC WITH DIFFERENTIAL/PLATELET
Basophils Absolute: 0 K/uL (ref 0.0–0.1)
Basophils Relative: 0.9 % (ref 0.0–3.0)
Eosinophils Absolute: 0.2 K/uL (ref 0.0–0.7)
Eosinophils Relative: 3.2 % (ref 0.0–5.0)
HCT: 40.9 % (ref 36.0–46.0)
Hemoglobin: 13.6 g/dL (ref 12.0–15.0)
Lymphocytes Relative: 43.8 % (ref 12.0–46.0)
Lymphs Abs: 2.3 K/uL (ref 0.7–4.0)
MCHC: 33.4 g/dL (ref 30.0–36.0)
MCV: 91.6 fl (ref 78.0–100.0)
Monocytes Absolute: 0.5 K/uL (ref 0.1–1.0)
Monocytes Relative: 9 % (ref 3.0–12.0)
Neutro Abs: 2.3 K/uL (ref 1.4–7.7)
Neutrophils Relative %: 43.1 % (ref 43.0–77.0)
Platelets: 239 K/uL (ref 150.0–400.0)
RBC: 4.47 Mil/uL (ref 3.87–5.11)
RDW: 15 % (ref 11.5–15.5)
WBC: 5.3 K/uL (ref 4.0–10.5)

## 2024-05-10 LAB — COMPREHENSIVE METABOLIC PANEL WITH GFR
ALT: 29 U/L (ref 0–35)
AST: 26 U/L (ref 0–37)
Albumin: 4.4 g/dL (ref 3.5–5.2)
Alkaline Phosphatase: 141 U/L — ABNORMAL HIGH (ref 39–117)
BUN: 12 mg/dL (ref 6–23)
CO2: 30 meq/L (ref 19–32)
Calcium: 9 mg/dL (ref 8.4–10.5)
Chloride: 99 meq/L (ref 96–112)
Creatinine, Ser: 0.89 mg/dL (ref 0.40–1.20)
GFR: 66.78 mL/min (ref >60.00)
Glucose, Bld: 77 mg/dL (ref 70–99)
Potassium: 4 meq/L (ref 3.5–5.1)
Sodium: 138 meq/L (ref 135–145)
Total Bilirubin: 0.3 mg/dL (ref 0.2–1.2)
Total Protein: 6.8 g/dL (ref 6.0–8.3)

## 2024-05-10 NOTE — Progress Notes (Signed)
 Chief Complaint: Follow-up of C. difficile Primary GI MD: Dr. Leigh  HPI: Discussed the use of AI scribe software for clinical note transcription with the patient, who gave verbal consent to proceed.  Angel Maddox is a 68 year old female with a history of C. difficile infection and multiple sclerosis who presents with irregular bowel movements.  She has a history of C. difficile infection following antibiotic use after surgery in September. She was treated with vancomycin  and a probiotic, followed by a long taper of vancomycin . During a trip to [place], she did not experience C. difficile symptoms. Currently, she experiences irregular bowel movements, describing them as 'it comes and goes,' with episodes of watery stools up to five times a day, alternating with days of no bowel movements or clumpy stools. This pattern has been ongoing since the beginning of the week. No current abdominal pain, nausea, or vomiting, although she recalls a past episode of significant abdominal swelling. She uses a digestive aid ordered from [employer], which she finds helpful, and has previously used Metamucil but stopped due to bloating and constipation.  She has a history of colon polyps, with her last colonoscopy in October 2018. She recalls being informed that she would not need another colonoscopy for five years.  She has a history of multiple sclerosis, which affects her mobility and causes her legs to 'go dead,' requiring her to use a walker at night. She recounts a fall that resulted in a fractured rib and wrist, impacting her ability to exercise and perform daily activities like cooking due to back pain. She mentions a past medication, Rebif, for MS, which was discontinued years ago due to elevated liver enzymes. Recently, her liver enzymes were found to be elevated again, although she is unsure of the exact levels. She had an MRCP in August 2025 for elevated liver enzymes.  In terms of social history, she  used to cook frequently but has stopped due to back pain. She sleeps on the couch most of the time. She notes that her MS doctor of 24 years passed away, and she is now seeing a PA. She has experienced a small stroke in the past.   PREVIOUS GI WORKUP   Colonoscopy 04/17/2017  - Tortuous colon. - One 6 mm tubular adenomatous polyp in the sigmoid colon, removed with a cold snare. Resected and retrieved.  - Internal hemorrhoids. - The examination was otherwise normal. - Recall colonoscopy 5 years, subsequently changed to 7 years    EGD 2008, normal    EGD 01/24/2019  - A 1 cm hiatal hernia was present. - The exam of the esophagus was otherwise normal. No stenosis / stricture appreciated. - A guidewire was placed and the scope was withdrawn. Empiric dilation was performed in the entire esophagus with a Savary dilator with mild resistance at 17 mm. Relook endoscopy showed no mucosal wrents. Biopsies were taken with a cold forceps in the upper third of the esophagus, in the middle third of the esophagus and in the lower third of the esophagus for histology. - One non-bleeding superficial clean based gastric ulcer with no stigmata of bleeding was found at the pylorus. The lesion was 3 mm in largest dimension. - Patchy moderate inflammation characterized by erosions, erythema and friability was found in the gastric antrum. - The exam of the stomach was otherwise normal. - Biopsies were taken with a cold forceps in the gastric body, at the incisura and in the gastric antrum for Helicobacter pylori testing. -  The duodenal bulb and second portion of the duodenum were normal.     1. Surgical [P], gastric antrum and gastric body - MILD REACTIVE GASTROPATHY. GLENWOOD PHLEGM IS NEGATIVE FOR HELICOBACTER PYLORI. - NO INTESTINAL METAPLASIA, DYSPLASIA, OR MALIGNANCY. 2. Surgical [P], distal, mid, proximal esophagus - BENIGN SQUAMOUS MUCOSA. - NO INCREASE IN INTRAEPITHELIAL EOSINOPHILS. - NO  INTESTINAL METAPLASIA, DYSPLASIA, OR MALIGNANCY.  Past Medical History:  Diagnosis Date   Anxiety    Arthritis    osteo   Atypical mole 08/10/2021   Right Abdomen (side) upper (severe)   Bipolar 1 disorder (HCC)    Cervicalgia    Chronic kidney disease    kidney stone   COPD (chronic obstructive pulmonary disease) (HCC)    new diagnosis- now sees pulmonologist   Depression    Deviated septum    Elevated liver enzymes    She says normal now   GERD (gastroesophageal reflux disease)    H/O hiatal hernia    Headache    Hip fracture (HCC) 2017   left   History of kidney stones    4-5 yrs ago   Hypertension    Multiple sclerosis     Had for 15 years   Multiple sclerosis    dx 1999   Tremors of nervous system     Past Surgical History:  Procedure Laterality Date   ANTERIOR CERVICAL DECOMP/DISCECTOMY FUSION N/A 07/28/2017   Procedure: CERVICAL FOUR-FIVE, CERVICAL FIVE-SIX ANTERIOR CERVICAL DECOMPRESSION/DISCECTOMY FUSION;  Surgeon: Joshua Alm RAMAN, MD;  Location: [facility] OR;  Service: Neurosurgery;  Laterality: N/A;   COLONOSCOPY  2008   Dr.Kaplan   EYE SURGERY     [facility]   HIP PINNING,CANNULATED Left 11/09/2015   Procedure: CANNULATED HIP PINNING;  Surgeon: Redell Shoals, MD;  Location: [facility] ORS;  Service: Orthopedics;  Laterality: Left;   NASAL SEPTOPLASTY W/ TURBINOPLASTY Bilateral 01/12/2021   Procedure: NASAL SEPTOPLASTY WITH BILATERAL  TURBINATE REDUCTION;  Surgeon: Karis Clunes, MD;  Location: [facility];  Service: ENT;  Laterality: Bilateral;   TUBAL LIGATION      Current Outpatient Medications  Medication Sig Dispense Refill   gabapentin  (NEURONTIN ) 300 MG capsule Take 2 capsules (600 mg total) by mouth 3 (three) times daily. 180 capsule 0   amLODipine  (NORVASC ) 10 MG tablet Take 1 tablet (10 mg total) by mouth daily. 90 tablet 1   baclofen (LIORESAL) 10 MG tablet Take 10 mg by mouth 2 (two) times daily. Takes one in the morning and one  at noon     buPROPion  (WELLBUTRIN  XL) 150 MG 24 hr tablet Take 3 tablets (450 mg total) by mouth daily. 270 tablet 3   citalopram  (CELEXA ) 40 MG tablet Take 1 tablet (40 mg total) by mouth daily. 90 tablet 3   clonazePAM  (KLONOPIN ) 1 MG tablet Take 1 tablet (1 mg total) by mouth 3 (three) times daily. 90 tablet 3   dalfampridine 10 MG TB12 2 (two) times daily.     dicyclomine  (BENTYL ) 10 MG capsule TAKE 1 CAPSULE (10 MG TOTAL) BY MOUTH EVERY 8 (EIGHT) HOURS AS NEEDED FOR SPASMS. 30 capsule 1   losartan  (COZAAR ) 25 MG tablet TAKE 1 TABLET BY MOUTH EVERY DAY 90 tablet 1   mirtazapine  (REMERON ) 15 MG tablet Take 1 tablet (15 mg total) by mouth at bedtime as needed. 90 tablet 3   omeprazole  (PRILOSEC) 40 MG capsule Take 1 capsule (40 mg total) by mouth daily. 90 capsule 3   ondansetron  (ZOFRAN -ODT) 4  MG disintegrating tablet TAKE 1 TABLET BY MOUTH EVERY 8 HOURS AS NEEDED FOR NAUSEA AND VOMITING 30 tablet 2   Oxycodone  HCl 10 MG TABS Take 10 mg by mouth 3 (three) times daily as needed.     primidone  (MYSOLINE ) 250 MG tablet Take 250 mg by mouth 3 (three) times daily.     saccharomyces boulardii (FLORASTOR) 250 MG capsule Take 1 capsule (250 mg total) by mouth 2 (two) times daily. 60 capsule 1   tiZANidine  (ZANAFLEX ) 4 MG capsule Take 4 mg by mouth at bedtime. (Patient not taking: Reported on 05/10/2024)     vancomycin  (VANCOCIN ) 125 MG capsule VANCOMYCIN  125MG  ONE CAPSULE PO QID X 14 DAYS THEN ONE CAPSULE BY PO TID X 7 DAYS THEN ONE CAPSULE PO BID X 7 DAYS THEN ONE CAPSULE PO ONCE DAILY X 7 DAYS THEN ONE CAPSULE PO EVERY THIRD DAY X 4 WEEKS. (Patient not taking: Reported on 05/10/2024) 108 capsule 0   No current facility-administered medications for this visit.    Allergies as of 05/10/2024 - Review Complete 03/11/2024  Allergen Reaction Noted   Hydrocodone  Itching 08/15/2018    Family History  Problem Relation Age of Onset   Heart attack Mother    Breast cancer Maternal Aunt    Colon cancer  Neg Hx    Stomach cancer Neg Hx    Esophageal cancer Neg Hx    Rectal cancer Neg Hx     Social History   Socioeconomic History   Marital status: Married    Spouse name: Not on file   Number of children: Not on file   Years of education: Not on file   Highest education level: Not on file  Occupational History   Not on file  Tobacco Use   Smoking status: Former    Current packs/day: 0.00    Average packs/day: 0.3 packs/day for 15.0 years (3.8 ttl pk-yrs)    Types: Cigarettes    Start date: 01/25/1976    Quit date: 01/25/1991    Years since quitting: 33.3   Smokeless tobacco: Never   Tobacco comments:    NO SMOKING at All  Vaping Use   Vaping status: Never Used  Substance and Sexual Activity   Alcohol  use: No   Drug use: No   Sexual activity: Yes  Other Topics Concern   Not on file  Social History Narrative   Right Handed   1-2 Cups of Coffee per Day   Social Drivers of Health   Financial Resource Strain: Low Risk  (04/21/2020)   Received from [facility]   Overall Financial Resource Strain (CARDIA)    Difficulty of Paying Living Expenses: Not hard at all  Food Insecurity: Low Risk  (01/16/2024)   Received from [facility]   Hunger Vital Sign    Within the past 12 months, you worried that your food would run out before you got money to buy more: Never true    Within the past 12 months, the food you bought just didn't last and you didn't have money to get more. : Never true  Transportation Needs: No Transportation Needs (01/16/2024)   Received from [employer]    In the past 12 months, has lack of reliable transportation kept you from medical appointments, meetings, work or from getting things needed for daily living? : No  Physical Activity: Not on file  Stress: Stress Concern Present (04/17/2020)   Received from [facility] - Occupational Stress Questionnaire    Feeling of Stress : To some extent   Social Connections: Unknown (11/07/2021)   Received from [facility]   Social Network    Social Network: Not on file  Intimate Partner Violence: Unknown (09/29/2021)   Received from [facility]   HITS    Physically Hurt: Not on file    Insult or Talk Down To: Not on file    Threaten Physical Harm: Not on file    Scream or Curse: Not on file    Review of Systems:    Constitutional: No weight loss, fever, chills, weakness or fatigue HEENT: Eyes: No change in vision               Ears, Nose, Throat:  No change in hearing or congestion Skin: No rash or itching Cardiovascular: No chest pain, chest pressure or palpitations   Respiratory: No SOB or cough Gastrointestinal: See HPI and otherwise negative Genitourinary: No dysuria or change in urinary frequency Neurological: No headache, dizziness or syncope Musculoskeletal: No new muscle or joint pain Hematologic: No bleeding or bruising Psychiatric: No history of depression or anxiety    Physical Exam:  Vital signs: Ht 5' 6 (1.676 m)   Wt 132 lb 4 oz (60 kg)   BMI 21.35 kg/m   Constitutional: NAD, alert and cooperative Head:  Normocephalic and atraumatic. Eyes:   PEERL, EOMI. No icterus. Conjunctiva pink. Respiratory: Respirations even and unlabored. Lungs clear to auscultation bilaterally.   No wheezes, crackles, or rhonchi.  Cardiovascular:  Regular rate and rhythm. No peripheral edema, cyanosis or pallor.  Gastrointestinal:  Soft, nondistended, nontender. No rebound or guarding. Normal bowel sounds. No appreciable masses or hepatomegaly. Rectal:  Declines Msk:  Symmetrical without gross deformities. Without edema, no deformity or joint abnormality.  Neurologic:  Alert and  oriented x4;  grossly normal neurologically.  Essential tremor noted.  Difficulty speaking occasionally. Skin:   Dry and intact without significant lesions or rashes. Psychiatric: Oriented to person, place and time. Demonstrates good judgement and reason  without abnormal affect or behaviors.   RELEVANT LABS AND IMAGING: CBC    Component Value Date/Time   WBC 8.1 01/30/2024 1438   RBC 4.30 01/30/2024 1438   HGB 13.2 01/30/2024 1438   HGB 14.2 10/16/2018 1225   HCT 40.3 01/30/2024 1438   HCT 41.1 10/16/2018 1225   PLT 370.0 01/30/2024 1438   PLT 287 10/16/2018 1225   MCV 93.7 01/30/2024 1438   MCV 95 10/16/2018 1225   MCH 32.8 12/28/2018 0402   MCHC 32.8 01/30/2024 1438   RDW 14.0 01/30/2024 1438   RDW 13.9 10/16/2018 1225   LYMPHSABS 2.8 01/30/2024 1438   LYMPHSABS 2.1 10/16/2018 1225   MONOABS 0.6 01/30/2024 1438   EOSABS 0.2 01/30/2024 1438   EOSABS 0.1 10/16/2018 1225   BASOSABS 0.1 01/30/2024 1438   BASOSABS 0.1 10/16/2018 1225    CMP     Component Value Date/Time   NA 142 01/30/2024 1438   NA 138 07/22/2019 1505   K 3.8 01/30/2024 1438   CL 104 01/30/2024 1438   CO2 31 01/30/2024 1438   GLUCOSE 74 01/30/2024 1438   BUN 8 01/30/2024 1438   BUN 9 07/22/2019 1505   CREATININE 0.85 01/30/2024 1438   CALCIUM  8.5 01/30/2024 1438   PROT 6.5 01/30/2024 1438   PROT 7.0 10/16/2018 1225   ALBUMIN 3.8 01/30/2024 1438   ALBUMIN 4.6 10/16/2018 1225   AST 17 01/30/2024 1438  ALT 13 01/30/2024 1438   ALKPHOS 139 (H) 01/30/2024 1438   BILITOT 0.2 01/30/2024 1438   BILITOT <0.2 10/16/2018 1225   GFRNONAA 60 07/22/2019 1505   GFRAA 69 07/22/2019 1505     Assessment/Plan:    History of C. difficile C. Diff infection associated with recent antibiotic use s/p nasal septoplasty surgery. GI pathogen panel tested positive for C. difficile toxin A/B 01/31/2024 prescribed vancomycin  x 10 days. Recurrent C diff so put on longer taper with improvement of symptoms. Now having alternating loose stools, no stools, watery stools.  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 - Retest for C. difficile - Trial of fiber supplement - If no improvement on fiber supplement consider MiraLAX  for postinfectious IBS-C in setting of MS  History of a tubular  adenomatous polyp per colonoscopy 03/2017. Patient is due for a colon polyp surveillance colonoscopy 03/2024, appears she has recovered from C. difficile but we will check with stool study today as she still has occasional watery stools.  Extensive discussion with patient about possibility of her prepping for colonoscopy.  Her MS is obviously advanced from physical exam today with her unstable gait, difficulty speaking, and fragility as well as her recent multiple falls and now having to use a walker at night.  Patient is worried about ability to complete prep and I agree after seeing her on physical exam today.  She had a 6 mm tubular adenoma on last colonoscopy.  No rectal bleeding or weight loss.  I think it is reasonable to hold off on colonoscopy at this time and if symptoms arise we can always revisit this discussion.   -- with advancing MS and difficulty for her to complete prep and patient hesitancy we will currently hold off on repeat for now     GERD, hiatal hernia and prior history of a gastric ulcer per EGD 12/2018.  No GERD symptoms. - Continue Omeprazole  40 mg once daily,   History of elevated alk phos level with normal total bili, AST and ALT levels. Abdominal MRI/MRCP 02/21/2024 showed intra and extrahepatic biliary ducts at the upper limit of normal in caliber, CBD measuring up to 0.7 cm and tapering to the ampulla without calculus or other visible obstruction, and cholelithiasis without evidence of acute cholecystitis. Recent elevated LFTs with PCP 04/30/2024 with alk phos 165/AST 58/ALT 46/normal bilirubin - No further biliary workup recommended per prior review of MRI/MRCP by Dr. Leigh -Will recheck LFTs today   Multiple sclerosis Obviously advancing with unstable gait and difficulty speaking today.  Continue to follow-up with MS Doctor And was recently referred to neuromuscular provider in [facility], [place] [facility] 05/10/2024, 10:14  AM  Cc: Maree Isles, MD

## 2024-05-10 NOTE — Patient Instructions (Addendum)
 Your provider has requested that you go to the basement level for lab work before leaving today. Press B on the elevator. The lab is located at the first door on the left as you exit the elevator.  _______________________________________________________  If your blood pressure at your visit was 140/90 or greater, please contact your primary care physician to follow up on this.  _______________________________________________________  If you are age 68 or older, your body mass index should be between 23-30. Your Body mass index is 21.35 kg/m. If this is out of the aforementioned range listed, please consider follow up with your Primary Care Provider.  If you are age 74 or younger, your body mass index should be between 19-25. Your Body mass index is 21.35 kg/m. If this is out of the aformentioned range listed, please consider follow up with your Primary Care Provider.   ________________________________________________________  The [facility] providers would like to encourage you to use MYCHART to communicate with providers for non-urgent requests or questions.  Due to long hold times on the telephone, sending your provider a message by [facility] may be a faster and more efficient way to get a response.  Please allow 48 business hours for a response.  Please remember that this is for non-urgent requests.  _______________________________________________________  [facility] is using a team-based approach to care.  Your team is made up of your doctor and two to three APPS. Our APPS (Nurse Practitioners and Physician Assistants) work with your physician to ensure care continuity for you. They are fully qualified to address your health concerns and develop a treatment plan. They communicate directly with your gastroenterologist to care for you. Seeing the Advanced Practice Practitioners on your physician's team can help you by facilitating care more promptly, often allowing for earlier  appointments, access to diagnostic testing, procedures, and other specialty referrals.

## 2024-05-11 NOTE — Progress Notes (Signed)
 Agree with assessment and plan as outlined with the following thoughts.  Agree that if her MS is progressing and she does not think she can handle a colonoscopy prep, reasonable to forgo further surveillance. She had no high risk lesions on her last exam.  In regards to elevated AP - stable mild persistent elevation. Full serologic workup in 2020 negative. MRCP shows bile ducts at ULN but not out of normal range. This is a mild elevation that is stable. Could consider EUS +/- liver biopsy depending on how aggressive she wants to be with evaluating it. I think the likelhood of something bad causing this is quite low - again this has been ongoing for years and MRCP does not show any true ductal dilation. I would trend LFTs, repeat in 3-6 months, and I can see her in the office in 6 months to reassess. If she is worried about it could do another MRCP next year.

## 2024-05-13 ENCOUNTER — Ambulatory Visit: Payer: Self-pay | Admitting: Gastroenterology

## 2024-05-13 NOTE — Telephone Encounter (Signed)
 Inbound call from patient stating that she was returning a call back. Patient would like a call back. Please advise.

## 2024-05-14 ENCOUNTER — Encounter: Admitting: Gastroenterology

## 2024-05-14 ENCOUNTER — Other Ambulatory Visit

## 2024-05-14 DIAGNOSIS — Z8619 Personal history of other infectious and parasitic diseases: Secondary | ICD-10-CM

## 2024-05-15 LAB — C. DIFFICILE GDH AND TOXIN A/B
GDH ANTIGEN: NOT DETECTED
MICRO NUMBER:: 17250601
SPECIMEN QUALITY:: ADEQUATE
TOXIN A AND B: NOT DETECTED

## 2024-05-25 ENCOUNTER — Encounter: Payer: Self-pay | Admitting: Gastroenterology

## 2024-05-28 ENCOUNTER — Ambulatory Visit: Admitting: Physician Assistant

## 2024-05-28 ENCOUNTER — Encounter: Payer: Self-pay | Admitting: Physician Assistant

## 2024-05-28 DIAGNOSIS — G4700 Insomnia, unspecified: Secondary | ICD-10-CM

## 2024-05-28 DIAGNOSIS — G35D Multiple sclerosis, unspecified: Secondary | ICD-10-CM | POA: Diagnosis not present

## 2024-05-28 DIAGNOSIS — Z638 Other specified problems related to primary support group: Secondary | ICD-10-CM

## 2024-05-28 DIAGNOSIS — F4323 Adjustment disorder with mixed anxiety and depressed mood: Secondary | ICD-10-CM | POA: Diagnosis not present

## 2024-05-28 MED ORDER — MIRTAZAPINE 15 MG PO TABS: 22.5000 mg | ORAL_TABLET | Freq: Every day | ORAL | Status: AC

## 2024-05-28 NOTE — Progress Notes (Signed)
 [facility]  Patient ID: Angel Maddox,  MRN: [id number]  PCP: Maree Isles, MD  Date of Evaluation: 05/28/2024 Time spent:20 minutes  HISTORY/CURRENT STATUS: For routine 3 month med check.    Has been kind of down.  Tired of dealing with her physical health problems w/ MS, her grandkids other grandmother's drama, missing her son, and other chronic problems, it's discouraging.  She hasn't been able to go to church lately b/c she's been sick. Hasn't been to the gym in a long time b/c of increased falls.  She sees her MS provider who is sending her to a neuromuscular specialist in [place].  Says she's not suicidal but she wonders why she's here.  Hard time enjoying things. ADLs and personal hygiene are nl. Appetite is nl.  Anxiety is controlled. Klonopin  helps.  No active SI. No HI.   Patient denies increased energy with decreased need for sleep, increased talkativeness, racing thoughts, impulsivity or risky behaviors, increased spending, increased libido, grandiosity, increased irritability or anger, paranoia, or hallucinations.  Individual Medical History/ Review of Systems: Changes? :Yes   Had C Diff again.   Past medications for mental health diagnoses include: Depakote, Lamictal  caused insomnia, constipation, and tremor, Risperdal , Celexa , Klonopin , trazodone , Wellbutrin , Vistaril , primidone , gabapentin , Prozac, Zyprexa, Ambien , Lunesta, Effexor  Allergies: Hydrocodone   Current Medications:  Current Outpatient Medications:    amLODipine  (NORVASC ) 10 MG tablet, Take 1 tablet (10 mg total) by mouth daily., Disp: 90 tablet, Rfl: 1   baclofen (LIORESAL) 10 MG tablet, Take 10 mg by mouth 2 (two) times daily. Takes one in the morning and one at noon, Disp: , Rfl:    buPROPion  (WELLBUTRIN  XL) 150 MG 24 hr tablet, Take 3 tablets (450 mg total) by mouth daily., Disp: 270 tablet, Rfl: 3   citalopram  (CELEXA ) 40 MG tablet, Take 1 tablet (40 mg total) by mouth daily., Disp: 90  tablet, Rfl: 3   clonazePAM  (KLONOPIN ) 1 MG tablet, Take 1 tablet (1 mg total) by mouth 3 (three) times daily., Disp: 90 tablet, Rfl: 3   dalfampridine 10 MG TB12, 2 (two) times daily., Disp: , Rfl:    dicyclomine  (BENTYL ) 10 MG capsule, TAKE 1 CAPSULE (10 MG TOTAL) BY MOUTH EVERY 8 (EIGHT) HOURS AS NEEDED FOR SPASMS., Disp: 30 capsule, Rfl: 1   gabapentin  (NEURONTIN ) 300 MG capsule, Take 2 capsules (600 mg total) by mouth 3 (three) times daily., Disp: 180 capsule, Rfl: 0   losartan  (COZAAR ) 25 MG tablet, TAKE 1 TABLET BY MOUTH EVERY DAY, Disp: 90 tablet, Rfl: 1   mirtazapine  (REMERON ) 15 MG tablet, Take 1 tablet (15 mg total) by mouth at bedtime as needed., Disp: 90 tablet, Rfl: 3   omeprazole  (PRILOSEC) 40 MG capsule, Take 1 capsule (40 mg total) by mouth daily., Disp: 90 capsule, Rfl: 3   ondansetron  (ZOFRAN -ODT) 4 MG disintegrating tablet, TAKE 1 TABLET BY MOUTH EVERY 8 HOURS AS NEEDED FOR NAUSEA AND VOMITING, Disp: 30 tablet, Rfl: 2   Oxycodone  HCl 10 MG TABS, Take 10 mg by mouth 3 (three) times daily as needed., Disp: , Rfl:    primidone  (MYSOLINE ) 250 MG tablet, Take 250 mg by mouth 3 (three) times daily., Disp: , Rfl:    saccharomyces boulardii (FLORASTOR) 250 MG capsule, Take 1 capsule (250 mg total) by mouth 2 (two) times daily., Disp: 60 capsule, Rfl: 1   tiZANidine  (ZANAFLEX ) 4 MG capsule, Take 4 mg by mouth at bedtime. (Patient not taking: Reported on 05/10/2024), Disp: ,  Rfl:    vancomycin  (VANCOCIN ) 125 MG capsule, VANCOMYCIN  125MG  ONE CAPSULE PO QID X 14 DAYS THEN ONE CAPSULE BY PO TID X 7 DAYS THEN ONE CAPSULE PO BID X 7 DAYS THEN ONE CAPSULE PO ONCE DAILY X 7 DAYS THEN ONE CAPSULE PO EVERY THIRD DAY X 4 WEEKS. (Patient not taking: Reported on 05/28/2024), Disp: 108 capsule, Rfl: 0 Medication Side Effects: none  Family Medical/ Social History: Changes? none  MENTAL HEALTH EXAM:  There were no vitals taken for this visit.There is no height or weight on file to calculate BMI.   General Appearance: Casual and Well Groomed  Eye Contact:  Good  Speech:  Clear and Coherent and Normal Rate  Volume:  Normal  Mood:  Euthymic  Affect:  Congruent  Thought Process:  Goal Directed and Descriptions of Associations: Circumstantial  Orientation:  Full (Time, Place, and Person)  Thought Content: Logical   Suicidal Thoughts:  No  Homicidal Thoughts:  No  Memory:  WNL  Judgement:  Good  Insight:  Good  Psychomotor Activity:  Normal  Concentration:  Concentration: Good  Recall:  Good  Fund of Knowledge: Good  Language: Good  Assets:  Communication Skills Desire for Improvement Financial Resources/Insurance Housing Resilience Transportation  ADLs nl  Cognition: WNL  Prognosis:  Good   DIAGNOSES:    ICD-10-CM   1. Situational mixed anxiety and depressive disorder  F43.23     2. Insomnia, unspecified type  G47.00     3. MS (multiple sclerosis)  G35.D     4. Stress due to family tension  Z63.8       Receiving Psychotherapy: No  Marval Bunde, LCSW, but not recently  RECOMMENDATIONS:  PDMP was reviewed.  Klonopin  filled 05/11/2024. I provided approximately  20 minutes of face to face time during this encounter, including time spent before and after the visit in records review, medical decision making, counseling pertinent to today's visit, and charting.   Strongly recommend getting back in counseling.   Increase the Mirtazapine .   Continue Wellbutrin  XL 450 mg daily. Continue Celexa  40 mg, 1 p.o. daily. Continue Klonopin  1 mg, 1 po tid prn. Continue Gabapentin  300 mg, 2 po tid per another provider. Increase Mirtazapine  15 mg, to 1.5-2 pills at bedtime.  Get back in therapy with Marval Bunde, LCSW.  Return in 2 months.    Verneita Cooks, PA-C

## 2024-06-22 ENCOUNTER — Other Ambulatory Visit: Payer: Self-pay | Admitting: Physician Assistant

## 2024-06-22 DIAGNOSIS — F411 Generalized anxiety disorder: Secondary | ICD-10-CM

## 2024-07-10 ENCOUNTER — Other Ambulatory Visit: Payer: Self-pay | Admitting: Gastroenterology

## 2024-07-10 DIAGNOSIS — R1013 Epigastric pain: Secondary | ICD-10-CM

## 2024-07-30 ENCOUNTER — Ambulatory Visit: Admitting: Physician Assistant

## 2024-08-13 ENCOUNTER — Ambulatory Visit: Admitting: Physician Assistant
